# Patient Record
Sex: Female | Born: 1940 | Race: Black or African American | Hispanic: No | Smoking: Never smoker
Health system: Southern US, Community
[De-identification: ages and names within clinical notes are randomized; demographics above are authoritative.]

## PROBLEM LIST (undated history)

## (undated) DIAGNOSIS — G459 Transient cerebral ischemic attack, unspecified: Secondary | ICD-10-CM

## (undated) DIAGNOSIS — K219 Gastro-esophageal reflux disease without esophagitis: Secondary | ICD-10-CM

## (undated) DIAGNOSIS — I219 Acute myocardial infarction, unspecified: Secondary | ICD-10-CM

## (undated) DIAGNOSIS — E7800 Pure hypercholesterolemia, unspecified: Secondary | ICD-10-CM

## (undated) DIAGNOSIS — I499 Cardiac arrhythmia, unspecified: Secondary | ICD-10-CM

## (undated) DIAGNOSIS — R0600 Dyspnea, unspecified: Secondary | ICD-10-CM

## (undated) DIAGNOSIS — Z950 Presence of cardiac pacemaker: Secondary | ICD-10-CM

## (undated) DIAGNOSIS — G4730 Sleep apnea, unspecified: Secondary | ICD-10-CM

## (undated) DIAGNOSIS — R42 Dizziness and giddiness: Secondary | ICD-10-CM

## (undated) DIAGNOSIS — I10 Essential (primary) hypertension: Secondary | ICD-10-CM

## (undated) DIAGNOSIS — E785 Hyperlipidemia, unspecified: Secondary | ICD-10-CM

## (undated) DIAGNOSIS — F329 Major depressive disorder, single episode, unspecified: Secondary | ICD-10-CM

## (undated) DIAGNOSIS — I739 Peripheral vascular disease, unspecified: Secondary | ICD-10-CM

## (undated) DIAGNOSIS — I639 Cerebral infarction, unspecified: Secondary | ICD-10-CM

## (undated) DIAGNOSIS — J189 Pneumonia, unspecified organism: Secondary | ICD-10-CM

## (undated) DIAGNOSIS — R351 Nocturia: Secondary | ICD-10-CM

## (undated) DIAGNOSIS — F32A Depression, unspecified: Secondary | ICD-10-CM

## (undated) DIAGNOSIS — M1990 Unspecified osteoarthritis, unspecified site: Secondary | ICD-10-CM

## (undated) DIAGNOSIS — I2510 Atherosclerotic heart disease of native coronary artery without angina pectoris: Secondary | ICD-10-CM

## (undated) DIAGNOSIS — I509 Heart failure, unspecified: Secondary | ICD-10-CM

## (undated) HISTORY — PX: INSERT / REPLACE / REMOVE PACEMAKER: SUR710

## (undated) HISTORY — PX: VASCULAR SURGERY: SHX849

## (undated) HISTORY — PX: PACEMAKER INSERTION: SHX728

## (undated) HISTORY — PX: TUBAL LIGATION: SHX77

## (undated) HISTORY — PX: DILATION AND CURETTAGE OF UTERUS: SHX78

## (undated) HISTORY — PX: ABDOMINAL HYSTERECTOMY: SHX81

## (undated) HISTORY — PX: CARDIAC CATHETERIZATION: SHX172

## (undated) SURGERY — COLONOSCOPY
Anesthesia: Monitor Anesthesia Care

## (undated) NOTE — *Deleted (*Deleted)
   Subjective: *** Patient evaluated at bedside this AM. States she feels better today except new headache. Discussion about restarting her home medications regimen and patient agrees with the plan.  Denies any chest pain, burning during urination.  States has observed new sores on legs and previous ones are healing well.   Objective:  Vital signs in last 24 hours: Vitals:   09/23/20 0338 09/23/20 1305 09/23/20 2039 09/24/20 0515  BP: 111/76 133/79 113/70 138/67  Pulse: 75 (!) 57 79 (!) 59  Resp: 18 18 18 18   Temp: 98.5 F (36.9 C) 98.3 F (36.8 C) 98.2 F (36.8 C) 98 F (36.7 C)  TempSrc: Oral Oral Oral   SpO2: 92% 95% 96% 98%  Weight:      Height:       ***  Assessment/Plan:  Principal Problem:   Bacteremia due to Escherichia coli Active Problems:   Morbid obesity (HCC)   Paroxysmal atrial fibrillation (HCC)   Pressure injury of skin   Acute on chronic heart failure with preserved ejection fraction (HFpEF) (HCC)  *** Prior to Admission Living Arrangement: Anticipated Discharge Location: Barriers to Discharge: Dispo: Anticipated discharge in approximately *** day(s).   Honor Junes, MD 09/24/2020, 8:28 AM Pager: @MYPAGER @ After 5pm on weekdays and 1pm on weekends: On Call pager [phone]

---

## 1998-12-24 ENCOUNTER — Encounter: Payer: Self-pay | Admitting: Internal Medicine

## 1998-12-24 ENCOUNTER — Ambulatory Visit (HOSPITAL_COMMUNITY): Admission: RE | Admit: 1998-12-24 | Discharge: 1998-12-24 | Payer: Self-pay | Admitting: Internal Medicine

## 1999-09-22 ENCOUNTER — Ambulatory Visit (HOSPITAL_COMMUNITY): Admission: RE | Admit: 1999-09-22 | Discharge: 1999-09-22 | Payer: Self-pay | Admitting: Cardiovascular Disease

## 1999-09-22 ENCOUNTER — Encounter: Payer: Self-pay | Admitting: Cardiovascular Disease

## 1999-12-18 ENCOUNTER — Encounter: Admission: RE | Admit: 1999-12-18 | Discharge: 1999-12-18 | Payer: Self-pay | Admitting: Cardiovascular Disease

## 1999-12-18 ENCOUNTER — Encounter: Payer: Self-pay | Admitting: Cardiovascular Disease

## 2000-02-18 ENCOUNTER — Ambulatory Visit (HOSPITAL_COMMUNITY): Admission: RE | Admit: 2000-02-18 | Discharge: 2000-02-18 | Payer: Self-pay | Admitting: Cardiovascular Disease

## 2000-02-19 ENCOUNTER — Encounter: Payer: Self-pay | Admitting: Cardiovascular Disease

## 2001-01-12 ENCOUNTER — Encounter: Payer: Self-pay | Admitting: Cardiovascular Disease

## 2001-01-12 ENCOUNTER — Ambulatory Visit (HOSPITAL_COMMUNITY): Admission: RE | Admit: 2001-01-12 | Discharge: 2001-01-12 | Payer: Self-pay | Admitting: Cardiovascular Disease

## 2001-01-18 ENCOUNTER — Other Ambulatory Visit: Admission: RE | Admit: 2001-01-18 | Discharge: 2001-01-18 | Payer: Self-pay | Admitting: Obstetrics and Gynecology

## 2001-04-19 ENCOUNTER — Emergency Department (HOSPITAL_COMMUNITY): Admission: EM | Admit: 2001-04-19 | Discharge: 2001-04-19 | Payer: Self-pay | Admitting: Emergency Medicine

## 2001-04-20 ENCOUNTER — Emergency Department (HOSPITAL_COMMUNITY): Admission: EM | Admit: 2001-04-20 | Discharge: 2001-04-21 | Payer: Self-pay | Admitting: Emergency Medicine

## 2001-04-21 ENCOUNTER — Encounter: Payer: Self-pay | Admitting: Emergency Medicine

## 2001-09-06 ENCOUNTER — Encounter (INDEPENDENT_AMBULATORY_CARE_PROVIDER_SITE_OTHER): Payer: Self-pay | Admitting: *Deleted

## 2001-09-06 ENCOUNTER — Ambulatory Visit (HOSPITAL_COMMUNITY): Admission: RE | Admit: 2001-09-06 | Discharge: 2001-09-06 | Payer: Self-pay | Admitting: Gastroenterology

## 2001-10-25 DIAGNOSIS — Z950 Presence of cardiac pacemaker: Secondary | ICD-10-CM

## 2001-10-25 HISTORY — DX: Presence of cardiac pacemaker: Z95.0

## 2003-12-09 ENCOUNTER — Emergency Department (HOSPITAL_COMMUNITY): Admission: EM | Admit: 2003-12-09 | Discharge: 2003-12-10 | Payer: Self-pay | Admitting: Emergency Medicine

## 2003-12-10 ENCOUNTER — Inpatient Hospital Stay (HOSPITAL_COMMUNITY): Admission: AD | Admit: 2003-12-10 | Discharge: 2003-12-13 | Payer: Self-pay | Admitting: Cardiovascular Disease

## 2005-01-03 ENCOUNTER — Emergency Department (HOSPITAL_COMMUNITY): Admission: EM | Admit: 2005-01-03 | Discharge: 2005-01-03 | Payer: Self-pay | Admitting: Emergency Medicine

## 2005-09-21 ENCOUNTER — Ambulatory Visit (HOSPITAL_COMMUNITY): Admission: RE | Admit: 2005-09-21 | Discharge: 2005-09-21 | Payer: Self-pay | Admitting: Cardiovascular Disease

## 2006-03-31 ENCOUNTER — Ambulatory Visit (HOSPITAL_COMMUNITY): Admission: RE | Admit: 2006-03-31 | Discharge: 2006-03-31 | Payer: Self-pay | Admitting: Cardiology

## 2006-07-25 ENCOUNTER — Emergency Department (HOSPITAL_COMMUNITY): Admission: EM | Admit: 2006-07-25 | Discharge: 2006-07-25 | Payer: Self-pay | Admitting: Emergency Medicine

## 2007-01-23 ENCOUNTER — Encounter: Admission: RE | Admit: 2007-01-23 | Discharge: 2007-01-23 | Payer: Self-pay | Admitting: Emergency Medicine

## 2007-06-14 ENCOUNTER — Emergency Department (HOSPITAL_COMMUNITY): Admission: EM | Admit: 2007-06-14 | Discharge: 2007-06-14 | Payer: Self-pay | Admitting: Emergency Medicine

## 2007-06-28 ENCOUNTER — Encounter: Admission: RE | Admit: 2007-06-28 | Discharge: 2007-06-28 | Payer: Self-pay | Admitting: Cardiovascular Disease

## 2007-07-06 ENCOUNTER — Encounter: Admission: RE | Admit: 2007-07-06 | Discharge: 2007-07-06 | Payer: Self-pay | Admitting: Cardiovascular Disease

## 2008-06-09 IMAGING — CT CT CHEST W/O CM
2 of 4 series · 15 of 36 positions shown, 18 images · IV contrast (agent unspecified)
Comparison: Chest radiograph 07/25/06.  No more recent chest radiograph is available for review.  No prior chest CT.

CLINICAL DATA: Evaluate right upper lobe nodule found on recent chest x-ray.  Chronic shortness of breath with exertion and pacemaker.  
CHEST CT WITHOUT CONTRAST:
TECHNIQUE: Multidetector CT imaging of the chest was performed following the standard protocol without IV contrast.

[Series 2: chest w/o · axial · non-contrast · 0.70mm/px · z∈[-309,-59]mm · 12 of 60 slices shown, 15 images]
[im 5/60  mediastinal]
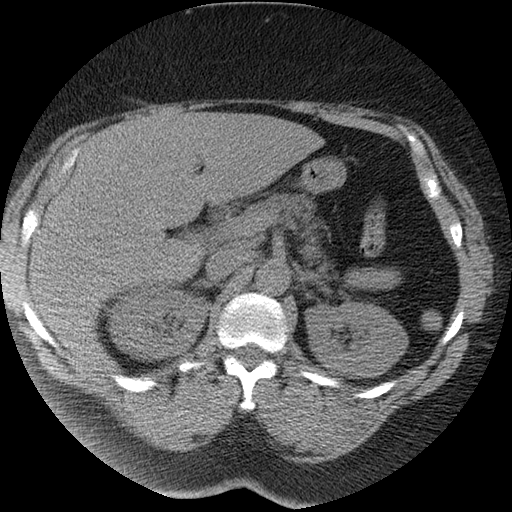
[im 5/60  lung]
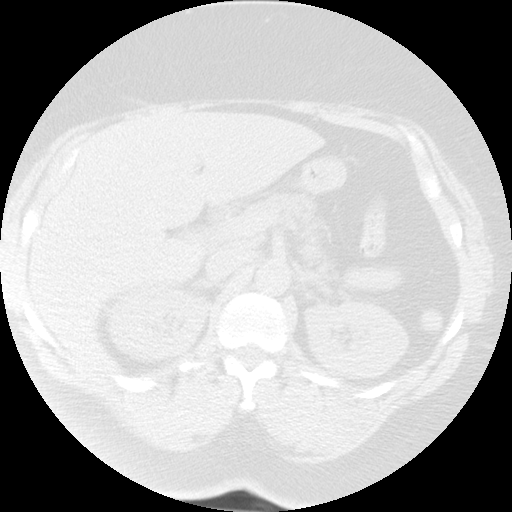
[im 10/60  lung]
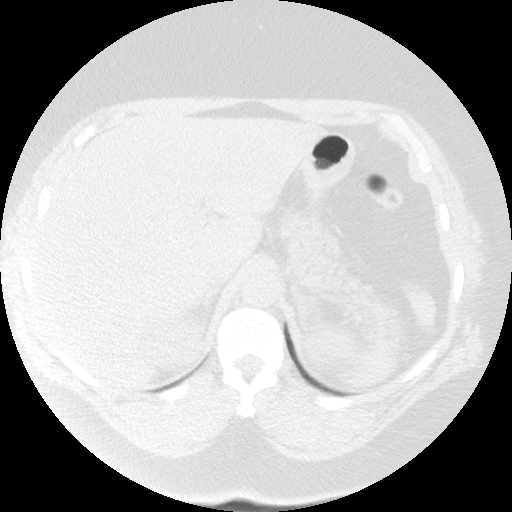
[im 14/60  lung]
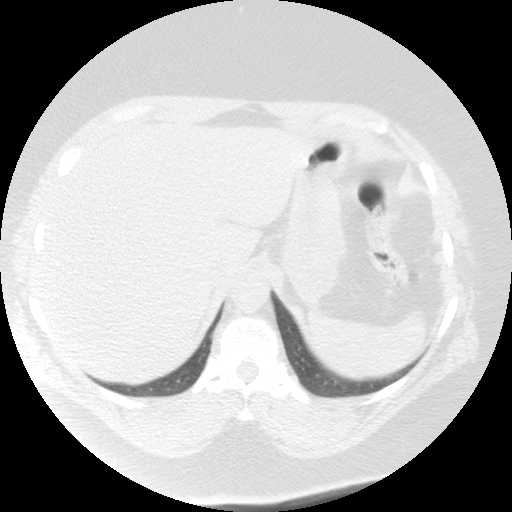
[im 19/60  lung]
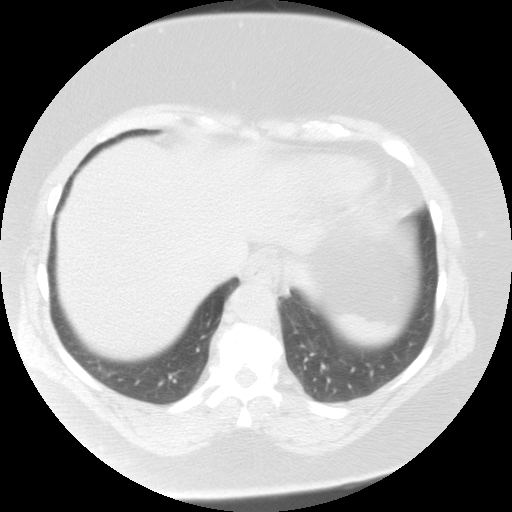
[im 23/60  mediastinal]
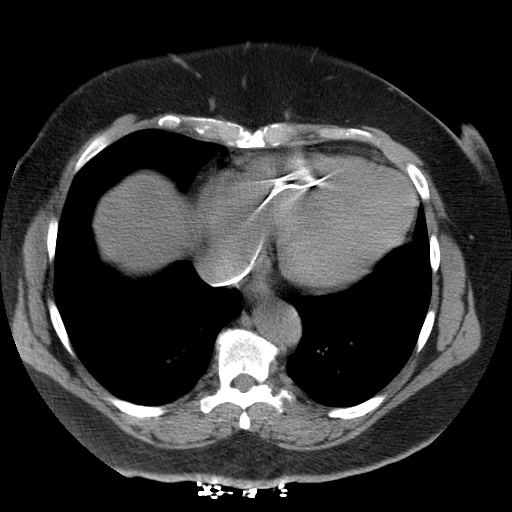
[im 23/60  lung]
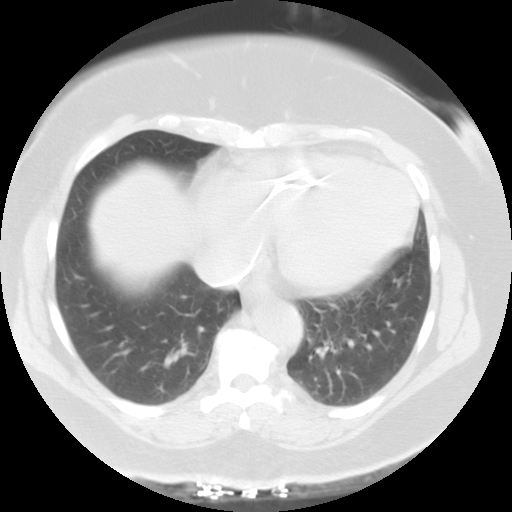
[im 28/60  lung]
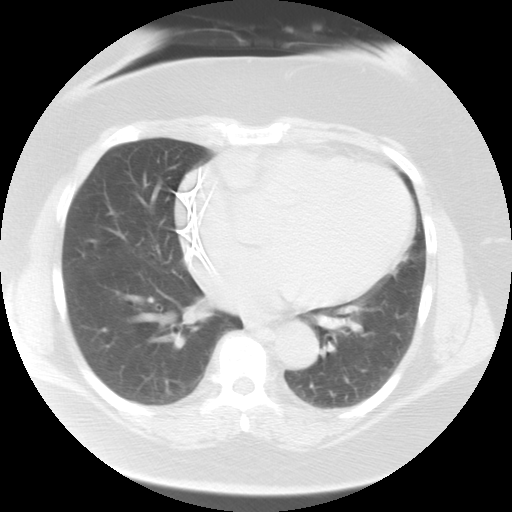
[im 32/60  lung]
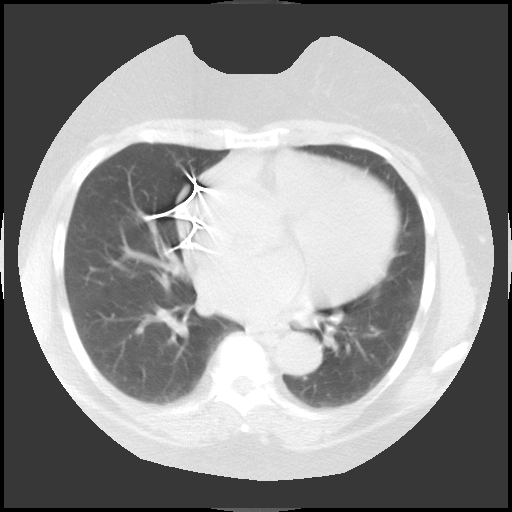
[im 37/60  lung]
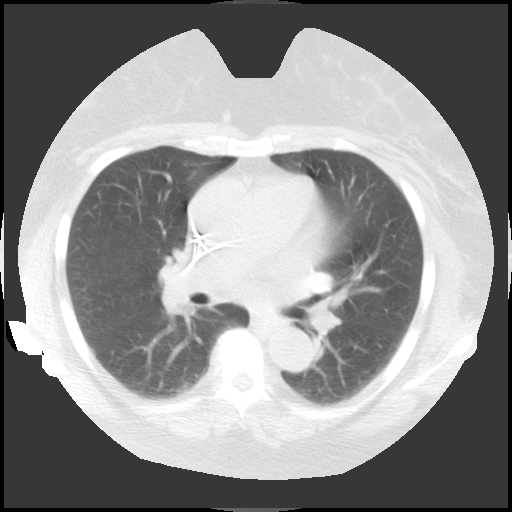
[im 41/60  mediastinal]
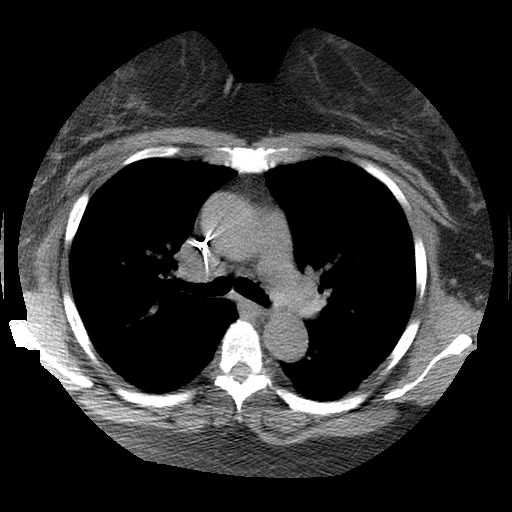
[im 41/60  lung]
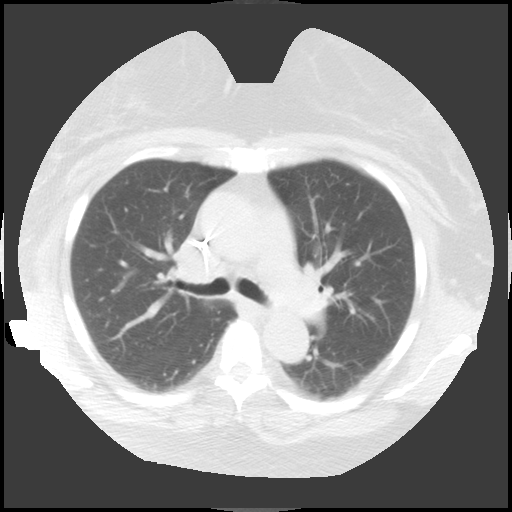
[im 46/60  lung]
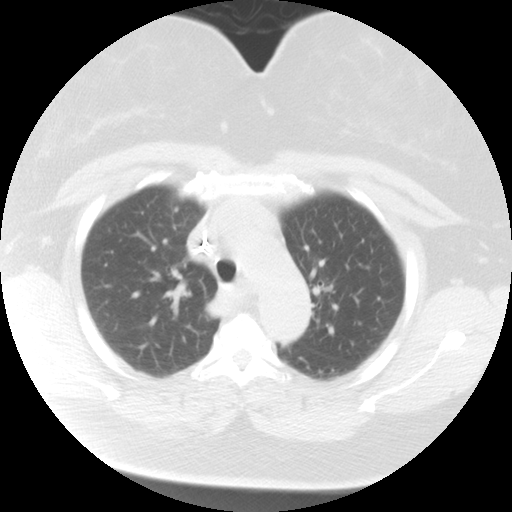
[im 50/60  lung]
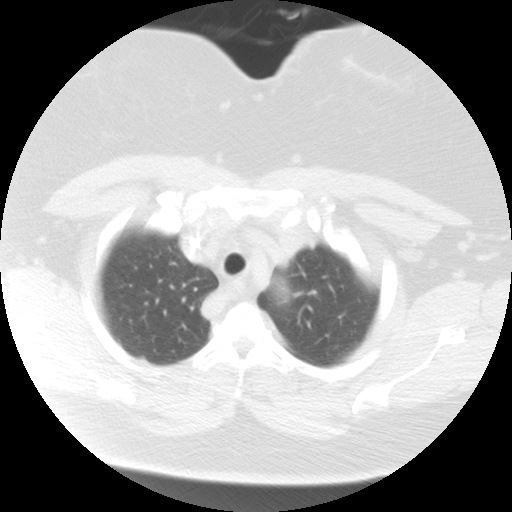
[im 55/60  lung]
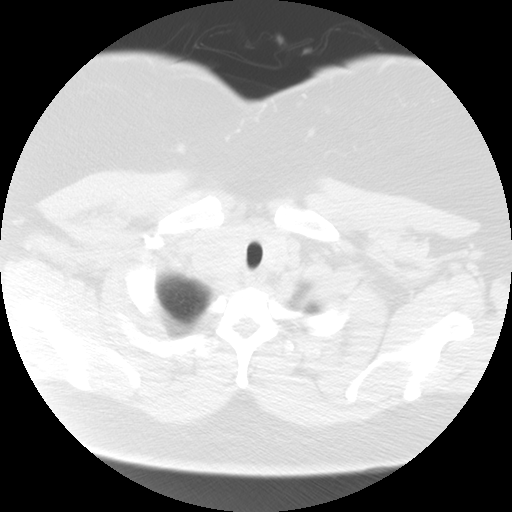

[Series 400: coronal chest · coronal · 0.70mm/px · 3 of 118 slices shown]
[im 24/118  lung]
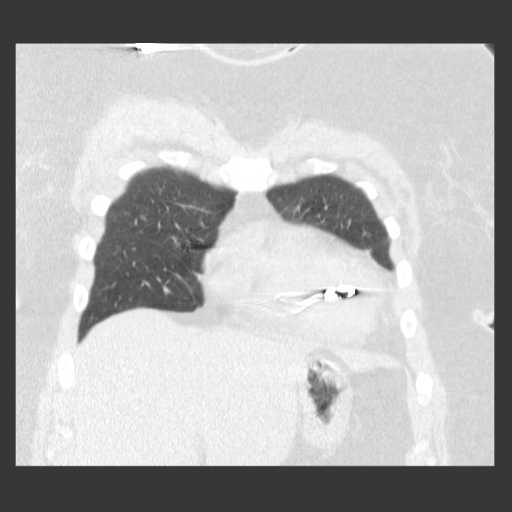
[im 47/118  lung]
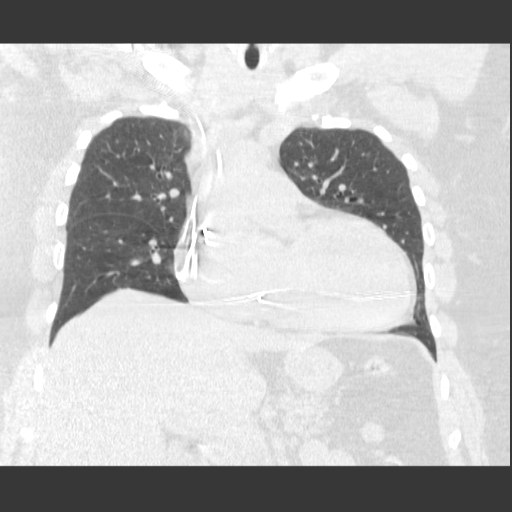
[im 71/118  lung]
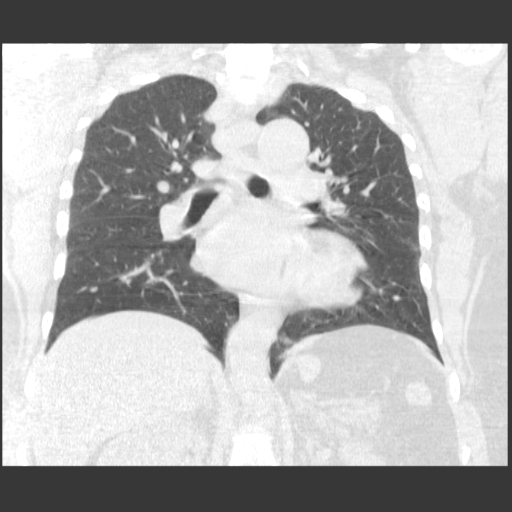

[15 of 36 positions shown; findings below may reference images not displayed]

FINDINGS: A right-sided pacemaker is identified, the leads of which cause some artifact.  On the scout view of the chest CT the leads of the pacer appear similar to the chest radiograph on [DATE].  The right lobe of the thyroid gland is enlarged, measuring up to 3.7 x 4.3 cm in axial dimension.  
There is cardiomegaly.  
Negative for pleural or pericardial effusion.  Minimal atherosclerotic calcification in the thoracic aortic arch.  No mediastinal or hilar lymphadenopathy identified.
In the right lower lobe (image #27 of series 3) is an 8 to 9 mm nodule, which is noncalcified.  In the right upper lobe (image #15 of series 3) is a 4.4 mm noncalcified pulmonary nodule.  No discrete nodules are identified on the left.  
Mild degenerative changes of the visualized thoracic spine but no evidence for compression deformities.  
The visualized portion of the upper abdomen shows a small hiatal hernia.  There is suggestion of a possible low density lesion such as a cyst in the inferior most image of the right kidney (image #60 of series 2).
IMPRESSION: 1.  Two pulmonary nodules as described above.  Recommend followup noncontrast chest CT in 9 to 12 months to ensure stability.  
2.  Question right renal low density lesion, partially imaged.  Consider followup renal ultrasound. 
3.  Enlargement of the right lobe of the thyroid gland.  
4.  Cardiomegaly.

## 2008-07-09 ENCOUNTER — Emergency Department (HOSPITAL_COMMUNITY): Admission: EM | Admit: 2008-07-09 | Discharge: 2008-07-09 | Payer: Self-pay | Admitting: Emergency Medicine

## 2009-05-09 ENCOUNTER — Emergency Department (HOSPITAL_BASED_OUTPATIENT_CLINIC_OR_DEPARTMENT_OTHER): Admission: EM | Admit: 2009-05-09 | Discharge: 2009-05-09 | Payer: Self-pay | Admitting: Emergency Medicine

## 2009-05-09 ENCOUNTER — Ambulatory Visit: Payer: Self-pay | Admitting: Diagnostic Radiology

## 2009-05-16 ENCOUNTER — Ambulatory Visit (HOSPITAL_COMMUNITY): Admission: RE | Admit: 2009-05-16 | Discharge: 2009-05-16 | Payer: Self-pay | Admitting: Cardiovascular Disease

## 2009-05-16 ENCOUNTER — Ambulatory Visit: Payer: Self-pay | Admitting: *Deleted

## 2009-05-16 ENCOUNTER — Encounter (INDEPENDENT_AMBULATORY_CARE_PROVIDER_SITE_OTHER): Payer: Self-pay | Admitting: Cardiovascular Disease

## 2010-02-27 ENCOUNTER — Ambulatory Visit: Payer: Self-pay | Admitting: Radiology

## 2010-02-27 ENCOUNTER — Encounter: Payer: Self-pay | Admitting: Emergency Medicine

## 2010-02-27 ENCOUNTER — Inpatient Hospital Stay (HOSPITAL_COMMUNITY): Admission: AD | Admit: 2010-02-27 | Discharge: 2010-03-01 | Payer: Self-pay | Admitting: Internal Medicine

## 2010-02-27 ENCOUNTER — Ambulatory Visit: Payer: Self-pay | Admitting: Vascular Surgery

## 2010-02-27 ENCOUNTER — Encounter (INDEPENDENT_AMBULATORY_CARE_PROVIDER_SITE_OTHER): Payer: Self-pay | Admitting: Internal Medicine

## 2010-02-27 ENCOUNTER — Ambulatory Visit: Payer: Self-pay | Admitting: Cardiology

## 2011-01-12 LAB — COMPREHENSIVE METABOLIC PANEL
ALT: 12 U/L (ref 0–35)
AST: 17 U/L (ref 0–37)
Albumin: 3.3 g/dL — ABNORMAL LOW (ref 3.5–5.2)
Alkaline Phosphatase: 73 U/L (ref 39–117)
BUN: 11 mg/dL (ref 6–23)
CO2: 23 mEq/L (ref 19–32)
Calcium: 8.5 mg/dL (ref 8.4–10.5)
Chloride: 109 mEq/L (ref 96–112)
Creatinine, Ser: 0.95 mg/dL (ref 0.4–1.2)
GFR calc Af Amer: >60 mL/min (ref >60)
GFR calc non Af Amer: 59 mL/min — ABNORMAL LOW (ref >60)
Glucose, Bld: 92 mg/dL (ref 70–99)
Potassium: 3.7 mEq/L (ref 3.5–5.1)
Sodium: 141 mEq/L (ref 135–145)
Total Bilirubin: 0.7 mg/dL (ref 0.3–1.2)
Total Protein: 6.7 g/dL (ref 6.0–8.3)

## 2011-01-12 LAB — CARDIAC PANEL(CRET KIN+CKTOT+MB+TROPI)
CK, MB: 0.7 ng/mL (ref 0.3–4.0)
CK, MB: 0.9 ng/mL (ref 0.3–4.0)
Relative Index: INVALID (ref 0.0–2.5)
Relative Index: INVALID (ref 0.0–2.5)
Relative Index: INVALID (ref 0.0–2.5)
Total CK: 64 U/L (ref 7–177)
Total CK: 73 U/L (ref 7–177)
Troponin I: 0.02 ng/mL (ref 0.00–0.06)
Troponin I: 0.03 ng/mL (ref 0.00–0.06)

## 2011-01-12 LAB — URINALYSIS, ROUTINE W REFLEX MICROSCOPIC
Ketones, ur: NEGATIVE mg/dL
Protein, ur: NEGATIVE mg/dL
Urobilinogen, UA: 0.2 mg/dL (ref 0.0–1.0)

## 2011-01-12 LAB — LIPID PANEL
Cholesterol: 158 mg/dL (ref 0–200)
HDL: 29 mg/dL — ABNORMAL LOW (ref >39)
LDL Cholesterol: 109 mg/dL — ABNORMAL HIGH (ref 0–99)
Total CHOL/HDL Ratio: 5.4 RATIO
Triglycerides: 100 mg/dL (ref <150)
VLDL: 20 mg/dL (ref 0–40)

## 2011-01-12 LAB — DIFFERENTIAL
Lymphocytes Relative: 30 % (ref 12–46)
Lymphs Abs: 1.6 10*3/uL (ref 0.7–4.0)
Monocytes Relative: 9 % (ref 3–12)
Neutro Abs: 2.8 10*3/uL (ref 1.7–7.7)
Neutrophils Relative %: 53 % (ref 43–77)

## 2011-01-12 LAB — HEMOGLOBIN A1C
Hgb A1c MFr Bld: 5.5 % (ref <5.7)
Mean Plasma Glucose: 111 mg/dL (ref <117)

## 2011-01-12 LAB — BASIC METABOLIC PANEL
Calcium: 9 mg/dL (ref 8.4–10.5)
Chloride: 108 mEq/L (ref 96–112)
Creatinine, Ser: 0.9 mg/dL (ref 0.4–1.2)
GFR calc Af Amer: >60 mL/min (ref >60)
GFR calc non Af Amer: >60 mL/min (ref >60)

## 2011-01-12 LAB — CBC
HCT: 39 % (ref 36.0–46.0)
Hemoglobin: 12.7 g/dL (ref 12.0–15.0)
MCHC: 32.5 g/dL (ref 30.0–36.0)
MCV: 94.5 fL (ref 78.0–100.0)
Platelets: 190 10*3/uL (ref 150–400)
RBC: 4.12 MIL/uL (ref 3.87–5.11)
RBC: 4.48 MIL/uL (ref 3.87–5.11)
RDW: 15 % (ref 11.5–15.5)
WBC: 4.7 10*3/uL (ref 4.0–10.5)
WBC: 5.2 10*3/uL (ref 4.0–10.5)

## 2011-01-12 LAB — PROTIME-INR
INR: 1.09 (ref 0.00–1.49)
Prothrombin Time: 14 seconds (ref 11.6–15.2)

## 2011-01-12 LAB — POCT CARDIAC MARKERS
CKMB, poc: <1 ng/mL — ABNORMAL LOW (ref 1.0–8.0)
Myoglobin, poc: 51.2 ng/mL (ref 12–200)

## 2011-01-12 LAB — GLUCOSE, CAPILLARY
Glucose-Capillary: 106 mg/dL — ABNORMAL HIGH (ref 70–99)
Glucose-Capillary: 107 mg/dL — ABNORMAL HIGH (ref 70–99)
Glucose-Capillary: 115 mg/dL — ABNORMAL HIGH (ref 70–99)
Glucose-Capillary: 133 mg/dL — ABNORMAL HIGH (ref 70–99)
Glucose-Capillary: 135 mg/dL — ABNORMAL HIGH (ref 70–99)
Glucose-Capillary: 94 mg/dL (ref 70–99)
Glucose-Capillary: 92 mg/dL (ref 70–99)

## 2011-01-12 LAB — TSH: TSH: 3.541 u[IU]/mL (ref 0.350–4.500)

## 2011-01-12 LAB — HOMOCYSTEINE: Homocysteine: 11.2 umol/L (ref 4.0–15.4)

## 2011-01-14 ENCOUNTER — Other Ambulatory Visit: Payer: Self-pay | Admitting: Internal Medicine

## 2011-01-14 ENCOUNTER — Ambulatory Visit
Admission: RE | Admit: 2011-01-14 | Discharge: 2011-01-14 | Disposition: A | Discharge status: Home / Self Care | Payer: Medicare Other | Source: Ambulatory Visit | Attending: Internal Medicine | Admitting: Internal Medicine

## 2011-01-14 DIAGNOSIS — R05 Cough: Secondary | ICD-10-CM

## 2011-01-31 LAB — BASIC METABOLIC PANEL
BUN: 14 mg/dL (ref 6–23)
CO2: 26 mEq/L (ref 19–32)
Calcium: 9 mg/dL (ref 8.4–10.5)
Creatinine, Ser: 1 mg/dL (ref 0.4–1.2)
GFR calc Af Amer: >60 mL/min (ref >60)

## 2011-01-31 LAB — CBC
HCT: 44.1 % (ref 36.0–46.0)
Hemoglobin: 14.5 g/dL (ref 12.0–15.0)
MCHC: 32.9 g/dL (ref 30.0–36.0)
MCV: 93.8 fL (ref 78.0–100.0)
Platelets: 202 10*3/uL (ref 150–400)
RBC: 4.71 MIL/uL (ref 3.87–5.11)
RDW: 14.4 % (ref 11.5–15.5)
WBC: 12.1 10*3/uL — ABNORMAL HIGH (ref 4.0–10.5)

## 2011-01-31 LAB — HEPATIC FUNCTION PANEL
ALT: 7 U/L (ref 0–35)
AST: 27 U/L (ref 0–37)
Albumin: 4.2 g/dL (ref 3.5–5.2)
Alkaline Phosphatase: 147 U/L — ABNORMAL HIGH (ref 39–117)
Bilirubin, Direct: 0 mg/dL (ref 0.0–0.3)
Indirect Bilirubin: 0.5 mg/dL (ref 0.3–0.9)
Total Bilirubin: 0.5 mg/dL (ref 0.3–1.2)
Total Protein: 8.6 g/dL — ABNORMAL HIGH (ref 6.0–8.3)

## 2011-01-31 LAB — DIFFERENTIAL
Basophils Absolute: 0.1 K/uL (ref 0.0–0.1)
Basophils Relative: 1 % (ref 0–1)
Eosinophils Absolute: 0 10*3/uL (ref 0.0–0.7)
Eosinophils Relative: 0 % (ref 0–5)
Lymphocytes Relative: 8 % — ABNORMAL LOW (ref 12–46)
Lymphs Abs: 1 K/uL (ref 0.7–4.0)
Monocytes Absolute: 0.5 K/uL (ref 0.1–1.0)
Monocytes Relative: 4 % (ref 3–12)
Neutro Abs: 10.5 10*3/uL — ABNORMAL HIGH (ref 1.7–7.7)
Neutrophils Relative %: 86 % — ABNORMAL HIGH (ref 43–77)

## 2011-01-31 LAB — BASIC METABOLIC PANEL WITH GFR
Chloride: 103 meq/L (ref 96–112)
GFR calc non Af Amer: 56 mL/min — ABNORMAL LOW (ref >60)
Glucose, Bld: 188 mg/dL — ABNORMAL HIGH (ref 70–99)
Potassium: 4 meq/L (ref 3.5–5.1)
Sodium: 141 meq/L (ref 135–145)

## 2011-01-31 LAB — LIPASE, BLOOD: Lipase: 30 U/L (ref 23–300)

## 2011-02-09 ENCOUNTER — Emergency Department (HOSPITAL_BASED_OUTPATIENT_CLINIC_OR_DEPARTMENT_OTHER)
Admission: EM | Admit: 2011-02-09 | Discharge: 2011-02-09 | Disposition: A | Discharge status: Home / Self Care | Payer: Medicare Other | Attending: Emergency Medicine | Admitting: Emergency Medicine

## 2011-02-09 DIAGNOSIS — J329 Chronic sinusitis, unspecified: Secondary | ICD-10-CM | POA: Insufficient documentation

## 2011-02-09 DIAGNOSIS — I2510 Atherosclerotic heart disease of native coronary artery without angina pectoris: Secondary | ICD-10-CM | POA: Insufficient documentation

## 2011-02-09 DIAGNOSIS — I10 Essential (primary) hypertension: Secondary | ICD-10-CM | POA: Insufficient documentation

## 2011-02-09 DIAGNOSIS — Z79899 Other long term (current) drug therapy: Secondary | ICD-10-CM | POA: Insufficient documentation

## 2011-02-09 DIAGNOSIS — Z8739 Personal history of other diseases of the musculoskeletal system and connective tissue: Secondary | ICD-10-CM | POA: Insufficient documentation

## 2011-02-22 ENCOUNTER — Ambulatory Visit
Admission: RE | Admit: 2011-02-22 | Discharge: 2011-02-22 | Disposition: A | Discharge status: Home / Self Care | Payer: Medicare Other | Source: Ambulatory Visit | Attending: Cardiovascular Disease | Admitting: Cardiovascular Disease

## 2011-02-22 ENCOUNTER — Other Ambulatory Visit: Payer: Self-pay | Admitting: Cardiovascular Disease

## 2011-02-22 DIAGNOSIS — R110 Nausea: Secondary | ICD-10-CM

## 2011-03-04 ENCOUNTER — Emergency Department (HOSPITAL_BASED_OUTPATIENT_CLINIC_OR_DEPARTMENT_OTHER)
Admission: EM | Admit: 2011-03-04 | Discharge: 2011-03-04 | Disposition: A | Discharge status: Home / Self Care | Payer: No Typology Code available for payment source | Attending: Emergency Medicine | Admitting: Emergency Medicine

## 2011-03-04 ENCOUNTER — Emergency Department (INDEPENDENT_AMBULATORY_CARE_PROVIDER_SITE_OTHER): Payer: No Typology Code available for payment source

## 2011-03-04 DIAGNOSIS — M25559 Pain in unspecified hip: Secondary | ICD-10-CM

## 2011-03-04 DIAGNOSIS — I10 Essential (primary) hypertension: Secondary | ICD-10-CM | POA: Insufficient documentation

## 2011-03-04 DIAGNOSIS — I2510 Atherosclerotic heart disease of native coronary artery without angina pectoris: Secondary | ICD-10-CM | POA: Insufficient documentation

## 2011-03-04 DIAGNOSIS — Y92410 Unspecified street and highway as the place of occurrence of the external cause: Secondary | ICD-10-CM | POA: Insufficient documentation

## 2011-03-04 DIAGNOSIS — E669 Obesity, unspecified: Secondary | ICD-10-CM

## 2011-03-04 DIAGNOSIS — S7000XA Contusion of unspecified hip, initial encounter: Secondary | ICD-10-CM | POA: Insufficient documentation

## 2011-03-04 DIAGNOSIS — S139XXA Sprain of joints and ligaments of unspecified parts of neck, initial encounter: Secondary | ICD-10-CM | POA: Insufficient documentation

## 2011-03-12 NOTE — Op Note (Signed)
NAME:  [name], [name]                   ACCOUNT NO.:  [id number]   MEDICAL RECORD NO.:  [id number]                   PATIENT TYPE:  INP   LOCATION:  2910                                 FACILITY:  [facility]   PHYSICIAN:  Minette Brine. Tysinger, M.D.              DATE OF BIRTH:  08/14/1941   DATE OF PROCEDURE:  12/12/2003  DATE OF DISCHARGE:  12/13/2003                                 OPERATIVE REPORT   PACEMAKER INSERTION   REFERRING PHYSICIAN:  Birdie Riddle, M.D.   PROCEDURE:  Insertion of AV sequential permanent transvenous pacemaker, DDD  mode.   INDICATION FOR PROCEDURE:  This 70 year old female presented with episodes  of dizziness and near syncope and telemetry in the hospital revealed  transient third degree AV block with heart rates less than 30 per minute  which were associated with dizzy spells.  She also had a documented six  second pause in which she had been ventricular stand still and normal P-  waves.  She was not on a beta blocker and her thyroid function has been  normal.  She had cardiac cath which showed essentially normal coronary  arteries with normal flow and with these findings she was considered a  candidate for AV sequential pacing.   PROCEDURE:  After signing an informed consent, the patient was premedicated  with 5 mg of Valium by mouth and brought to the electrophysiology lab at the  [facility] cardiac cath lab.  Her right anterior chest and base of neck were  prepped and draped in the sterile fashion and a right transverse  subclavicular plane was anesthetized locally with 1% lidocaine.  An incision  was made in this anesthetized plane with the incision being deepened into  the fascial layer overlying the pectoralis muscle.  A pocket was then formed  in this fascial layer for later insertion of the pulse generator.  An 8-  [place] Cook Introducer sheath was inserted percutaneously in the right  subclavian vein with a Seldinger wire being easily passed  into the superior  vena cava.  A bipolar ventricular lead was then inserted through the 8-  [place] sheath made by [facility], model [id number], serial [id number].  This  lead was advanced to the superior vena cava and the 8-French sheath was  removed in the usual fashion.  We then inserted a 7-French Cook Introducer  sheath percutaneously into the right subclavian vein and a [employer]  atrial J-lead was selected, model [id number], serial [id number].  After proper  preparation, this was inserted through the 7-French sheath and advanced to  the superior vena cava.  The 7-French sheath was removed in the usual  fashion.  The ventricular lead was then advanced into the right atrium and  right ventricle where the tip was positioned in the right ventricular apex.  After several different repositionings, we found a very good pacing area  with R-wave sensitivity of 28.9 mV, a resistance  of 704 ohms, and a minimum  voltage threshold of 0.4 volts, utilizing 0.6 ma of current.  We then  secured this lead at its insertion site with 2-0 silk.  The atrial J-lead  was then advanced into the right atrium where the tip was positioned  anteriorly in the right atrial appendage.  Very good pacing parameters were  obtained with a P-wave sensitivity of 9.0 mV, a resistance of 583 ohms, and  a minimum voltage threshold of 0.5 volts, utilizing 0.8 ma of current.  After obtaining these parameters, the atrial lead was secured, probably at  its insertion site.  The wound was lavaged profusely with a kanamycin  solution.  We then selected a [employer] pulse generator, model  Identity XL-DR, model [id number], serial [id number].  After proper analyzing the  pulse generator, it was attached to the pacing electrodes in the usual  fashion.  The pulse generator was then placed within the previously formed  pocket and the wound was closed in layers using 2-0 Dexon.  Final skin  closure was obtained with a cutaneous layer  of  Steri-Strips.  The patient tolerated the procedure well and no complications  were noted.  At the end of the procedure, a sterile bulky dressing was  applied to the wound and she was returned to her room in satisfactory  condition.  The pacemaker is note to be functioning normally in the DDD  mode.                                               John R. Glade Lloyd, M.D.    JRT/MEDQ  D:  12/12/2003  T:  12/13/2003  Job:  [id number]   cc:   Birdie Riddle, M.D.  [address] [address]   [facility]

## 2011-03-12 NOTE — Procedures (Signed)
[place]. [facility]  Patient:    [name], [name] Visit Number: [id number] MRN: [id number]          Service Type: END Location: ENDO Attending Physician:  Orvis Brill Dictated by:   Jeryl Columbia, M.D. Proc. Date: 09/06/01 Admit Date:  09/06/2001   CC:         Birdie Riddle, M.D.                           Procedure Report  PROCEDURE PERFORMED:  Colonoscopy  INDICATION:  Guaiac positivity.  Two episodes of bright red blood.  Consent was signed after risks, benefits, method, and options were thoroughly discussed in the office.  MEDICINES USED ADDITIONAL FOR THIS PROCEDURE:  Versed 3.  DESCRIPTION OF PROCEDURE:  Rectal inspection is pertinent for small external hemorrhoids.  Digital exam was negative.  Video colonoscope was inserted and with surprising ease advanced around the colon to the hepatic flexure.  At that point, the scope began to loop.  We rolled her on her back and with some abdominal pressure were able to advanced to the cecal pole which was identified by the appendiceal orifice and the ileocecal valve.  Prep was adequate.  No obvious abnormality was seen on insertion.  On slow withdraw through the colon other than an occasional left sided diverticula. No masses, polyps or signs of bleeding were seen as we slowly withdrew back to the rectum. Once back in the rectum, the scope was retroflexed. Pertinent for some internal hemorrhoids.  The scope was straightened and advanced a short ways up the left side of the colon.  Air was suctioned and the scope removed.  The patient tolerated the procedure well. There were no obvious immediate complication.  ENDOSCOPIC DIAGNOSES: 1. Internal and external hemorrhoids, small. 2. Left sided occasional diverticula. 3. Otherwise within normal limits to the cecum.  PLAN:  Follow up p.r.n. or in two to three months to recheck symptoms.  Guaiac to make sure no further workup plans are  needed. Otherwise yearly rectals and guaiac per Dr. Birdie Riddle and consider repeat screening in five to ten years. Dictated by:   Jeryl Columbia, M.D. Attending Physician:  Orvis Brill DD:  09/06/01 TD:  09/06/01 Job: [id number]

## 2011-03-12 NOTE — H&P (Signed)
[place]. [facility]  Patient:    [name], [name] Visit Number: [id number] MRN: [id number]          Service Type: END Location: ENDO Attending Physician:  Orvis Brill Dictated by:   Jeryl Columbia, M.D. Proc. Date: 09/06/01 Admit Date:  09/06/2001   CC:         Birdie Riddle, M.D.                         History and Physical  PROCEDURE:  Esophagogastroduodenoscopy with biopsy.  ENDOSCOPIST:  Jeryl Columbia, M.D.  INDICATIONS:  Guaiac positivity, upper tract symptoms.  INFORMED CONSENT:  Consent was signed after risk, benefits, methods and options were thoroughly discussed in the office.  MEDICINES USED:  Fentanyl 50 mcg, Versed 3 mg.  DESCRIPTION OF PROCEDURE:  The video endoscope was inserted by direct vision. The proximal and mid esophagus were normal.  In the distal esophagus was a small hiatal hernia and a small nodule which is probably just a little erythematous tissue at the GE junction.  A few cold biopsies were obtained at the end of the procedure.  There was no sign of Barretts or significant inflammation.  The scope passed into the stomach and advanced to the antrum where some minimal antritis was seen and advanced to a normal pylorus into a normal duodenal bulb and around the C-loop to a normal portion of the second duodenum.  The scope was withdrawn back to the bulb, and a good look there ruled out abnormalities in all locations.  The scope was withdrawn back into the stomach and retroflexed.  In the cardia, the hiatal hernia was confirmed. The fundus was normal.  Angularis, lesser and greater curve was normal except for some mild gastritis.  The scope was straightened and straight visualization of the stomach confirmed the above.  No additional findings were seen.  A few biopsies of the antrum and a few of the proximal stomach were obtained to confirm the gastritis as well as rule out Helicobacter.  The scope was removed  after air was suctioned.  Again a good look at the esophagus was normal except for the Z-line small erythematous nodule.  His biopsies were obtained at this point.  The scope was removed.  The patient tolerated the procedure well, and there were no obvious immediate complications.  ENDOSCOPIC DIAGNOSES: 1. Small hiatal hernia. 2. Z-line small nodule status post biopsy. 3. Minimal antritis and gastritis status post biopsy. 4. Otherwise normal esophagogastroduodenoscopy.  PLAN: 1. Await pathology. 2. Continue Prevacid. 3. Hiatal hernia reading material. 4. Antireflux measures. 5. See colonoscopy for further work-up and plans. Dictated by:   Jeryl Columbia, M.D. Attending Physician:  Orvis Brill DD:  09/06/01 TD:  09/06/01 Job: [id number]

## 2011-03-12 NOTE — Cardiovascular Report (Signed)
NAME:  [name], [name]                   ACCOUNT NO.:  [id number]   MEDICAL RECORD NO.:  [id number]                   PATIENT TYPE:  INP   LOCATION:  6599                                 FACILITY:  [facility]   PHYSICIAN:  Birdie Riddle, M.D.               DATE OF BIRTH:  1941/09/18   DATE OF PROCEDURE:  12/11/2003  DATE OF DISCHARGE:                              CARDIAC CATHETERIZATION   PROCEDURES:  1. Left heart catheterization.  2. Selective coronary angiography.  3. Left ventricular function study.  4. Transvenous temporary pacemaker placement.   INDICATIONS:  This 70 year old black female with history of hypertension,  obesity, and exertional dyspnea had transient third degree heart block with  a heart rate less than 30 per minute and history of previous dizzy spells at  night without beta blocker use.   APPROACH:  Right femoral artery using 5 French sheath and catheter in right  femoral vein with 6 French sheath and 5 French balloon-tip temporary  pacemaker.   COMPLICATIONS:  None.   HEMODYNAMIC DATA:  The left ventricular pressure was 202/7 and aortic  pressure was 202/121.   The patient required on squirt of sublingual nitroglycerin 200 mcg and 2.5  mg of enalaprilat IV for blood pressure control.   TRANSVENOUS TEMPORARY PACEMAKER PLACEMENT:  A 5 French balloon-tip pacer  wire was advanced in the right ventricle under fluoroscopic guidance.  It  was connected to the pacemaker box and after checking threshold and  sensitivity, the pacemaker was left at rate of 60 and MA of 5 and  sensitivity of 5.   CORONARY ANATOMY:  The left main coronary artery was short and unremarkable.   Left anterior descending coronary artery:  The left anterior descending  coronary artery had ostial 10% narrowing.  Otherwise, unremarkable.  Its  diagonal one and two vessels were unremarkable.  Diagonal three and four  were also unremarkable, but smaller vessels.   Left circumflex  coronary artery:  The left circumflex coronary artery and  essentially unremarkable with obtuse marginal branch being very small.  Obtuse marginal branch 2, 3, and 4 were unremarkable.   Right coronary artery:  The right coronary artery was dominant, large vessel  and was unremarkable with normal posterior lateral branch and posterior  descending coronary arteries.   LEFT VENTRICULOGRAM:  The left ventriculogram showed good left ventricular  systolic function with ejection fraction of 60%.   IMPRESSION:  1. Near normal coronaries.  2. Good left ventricular systolic function.  3. Hypertension.  4. Transient temporary pacemaker placement for high grade AV block.   RECOMMENDATIONS:  This patient will undergo permanent pacemaker placement by  Dr. Roe Rutherford in the near future and she will be managed medically  otherwise.  Birdie Riddle, M.D.    ASK/MEDQ  D:  12/11/2003  T:  12/11/2003  Job:  [id number]

## 2011-03-12 NOTE — Discharge Summary (Signed)
[name], [name]                   ACCOUNT NO.:  [id number]   MEDICAL RECORD NO.:  [id number]                   PATIENT TYPE:  INP   LOCATION:  2910                                 FACILITY:  [facility]   PHYSICIAN:  Birdie Riddle, M.D.               DATE OF BIRTH:  03-21-1941   DATE OF ADMISSION:  12/10/2003  DATE OF DISCHARGE:  12/13/2003                                 DISCHARGE SUMMARY   PRINCIPAL DIAGNOSES:  1. Paroxysmal high-grade atrioventricular block.  2. Hypertension.  3. Obesity.  4. Acute gastrointestinal bleed.  5. External hemorrhoids.  6. Anxiety.   PRINCIPAL PROCEDURES:  1. Left heart catheterization, left ventricular angiography, left _________     study done by Dr. Dixie Dials on December 11, 2003.  2. Identity XLDR DDDR pulse generator placement (atrioventricular sequential     pacer) by Dr. Roe Rutherford on December 12, 2003.   DISCHARGE MEDICATIONS:  1. Benicar 40 mg one daily.  2. Lasix 40 mg one daily.  3. Sular 20 mg one daily.  4. K-Dur 10 mEq two daily.  5. Protonix 40 mg one daily.  6. Dulcolax or glycerin suppository one daily for one week, then as needed.  7. Tylenol 325 mg four times daily as needed.   DISCHARGE ACTIVITY:  Sedentary for 10-14 days.  May walk with the arm in a  sling.   DISCHARGE DIET:  Low-fat, low-salt diet.   CONDITION ON DISCHARGE:  Stable.   FOLLOWUP:  By Dr. Roe Rutherford in 10 days, by Dr. Dixie Dials in four  weeks, and by Dr. Clarene Essex in two to four weeks.  Patient to call and make  appointments for outpatient colonoscopy and possible esophagoduodenoscopy.   HISTORY:  This 70 year old black female presented with recurrent GI bleed of  two days' duration.  She was evaluated in the emergency room.  She had had a  similar episode two years ago with EGD and colonoscopy done at that time  showing nonspecific findings.  She has cardiac risk factors of hypertension  and has morbid obesity.   PHYSICAL  EXAMINATION:  VITAL SIGNS:  Temperature 98.8, pulse 84, respiration  20, blood pressure 151/71, height 5 feet 8 inches, weight 298.5 pounds.  GENERAL:  Patient was alert, oriented x 3.  HEAD:  Normocephalic, atraumatic.  EYES:  [place] with pupils equally reactive to light, conjunctiva pink, sclera  white.  EARS, NOSE, THROAT:  Mucous membranes pink and moist.  NECK:  No JVD.  LUNGS:  Clear bilaterally.  HEART:  Normal S1, S2.  ABDOMEN:  Soft, nondistended.  EXTREMITIES:  No cyanosis or clubbing.  Trace edema.  RECTAL:  Sphincter tone normal.  Dark stool, positive for occult blood.  Presence of external hemorrhoids.  CNS:  Patient moving all four extremities.   LABORATORY DATA:  Normal hemoglobin/hematocrit, wbc count and platelet  count.  Normal sodium.  Low potassium of 3.0.  Normal BUN, creatinine.  Borderline glucose of 124-121.  Normal PT/PTT.  Occult blood positive x 2.  Liver enzymes normal.  Serum albumin normal.   Cardiac catheterization showed near-normal coronaries and good left  ventricular systolic function.  EKG showed sinus rhythm with possible left  atrial enlargement and left ventricular hypertrophy with QRS widening and  nonspecific T wave abnormality.   HOSPITAL COURSE:  The patient was admitted to telemetry unit.  GI consult of  Dr. Wynetta Emery was obtained.  The patient had repeat hemoglobin and hematocrit  checked that did not show any significant change.  However, on monitoring,  she showed a 5 second AV block.  Upon further questioning, the patient noted  occasional symptoms of dizziness in the night, although this did not occur  with the current event, as she was sleeping.  She underwent cardiac  catheterization that showed near-normal coronaries, with good left  ventricular systolic function, and she underwent permanent pacemaker  placement by Dr. Roe Rutherford on December 12, 2003.  She had no further  complications during her hospitalization, and per  gastroenterologist,  patient's colonoscopy and +/- EGD would be performed on an outpatient basis.  She was discharged home in satisfactory condition with followup by me in two  to four weeks, by Dr. Roe Rutherford in 10 days, and by Dr. Clarene Essex or one  of his associates in three to four weeks.  She will continue her  antihypertensive medications and Protonix, and also use Dulcolax or glycerin  suppository, as most likely source of fresh bleeding is from her external  hemorrhoids and hard stools.                                                Birdie Riddle, M.D.    ASK/MEDQ  D:  12/13/2003  T:  12/14/2003  Job:  [id number]

## 2011-03-12 NOTE — Procedures (Signed)
[place]. [facility]  Patient:    KITZIA, HONEGGER Visit Number: [id number] MRN: [id number]          Service Type: END Location: ENDO Attending Physician:  Orvis Brill Dictated by:   Jeryl Columbia, M.D. Proc. Date: 09/06/01 Admit Date:  09/06/2001   CC:         Birdie Riddle, M.D.   Procedure Report  PROCEDURE PERFORMED:  Esophagogastroduodenoscopy with biopsies.  ENDOSCOPIST:  Jeryl Columbia, M.D.  INDICATIONS FOR PROCEDURE:  Guaiac positivity, upper tract symptoms.  Consent was signed after risks, benefits, methods, and options were thoroughly discussed in the office.  MEDICATIONS USED:  Fentanyl 50 mcg, Versed 3 mg.  DESCRIPTION OF PROCEDURE:  Video endoscope inserted by direct vision.  The proximal and midesophagus were normal.  In the distal esophagus was a small hiatal hernia and a small nodule which was probably just a little erythematous tissue at the gastroesophageal junction.  A few cold biopsies were obtained at the end of the procedure.  There was no sign of Barretts or significant inflammation.  Scope passed into the stomach, advanced to the antrum where some minimal antritis was seen, advanced through a normal pylorus into a normal duodenal bulb and around the C-loop to a normal second portion of the duodenum.  The scope was withdrawn back to the bulb and a good look there ruled out abnormalities in that location.  Scope was withdrawn back to the stomach and retroflexed.  High in the cardia, the hiatal hernia was confirmed. The fundus was normal.  The angularis, lesser and greater curve was normal except for some mild gastritis.  The scope was straightened.  Straight visualization of the stomach confirmed the above.  No additional findings were seen.  A few biopsies in the antrum, a few in the proximal stomach were obtained to confirm gastritis, rule out helicobacter.  The scope was removed. After air was suctioned,  again a good look at the esophagus was normal except for the z-line small erythematous nodule.  These biopsies were obtained at this point.  The scope was removed.  The patient tolerated the procedure well. There was no obvious immediate complication.  ENDOSCOPIC DIAGNOSIS: 1. Small hiatal hernia. 2. Z-line small nodule status post biopsy. 3. Minimal antritis and gastritis, status post biopsy. 4. Otherwise normal esophagogastroduodenoscopy.  PLAN:  Await pathology.  Continue Prevacid, hiatal hernia reading material. Antireflux measures.  See colon dictation for further work-up and plans. Dictated by:   Jeryl Columbia, M.D. Attending Physician:  Orvis Brill DD:  09/06/01 TD:  09/06/01 Job: [id number]

## 2011-03-12 NOTE — Cardiovascular Report (Signed)
[name], [name]         ACCOUNT NO.:  [id number]   MEDICAL RECORD NO.:  [id number]          PATIENT TYPE:  OIB   LOCATION:  2899                         FACILITY:  [facility]   PHYSICIAN:  Iran Sizer, MD    DATE OF BIRTH:  1941/01/28   DATE OF PROCEDURE:  03/31/2006  DATE OF DISCHARGE:                              CARDIAC CATHETERIZATION   PROCEDURE:  1.  Left heart catheterization  2.  Coronary cineangiography.  3.  Left ventricular cineangiography.  4.  Angio-Seal of the right femoral artery.   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  She recently had  the onset of anterior chest discomfort and a stress test done on Mar 17, 2006, showed an abnormal study with an anterior and anterolateral scar with  superimposed ischemia as well as mild lateral inferior ischemia. With this  abnormal radial nuclear stress test, she was then scheduled for assessment  of her coronary status.   PROCEDURE:  After signing an informed consent, the patient was premedicated  with 5 mg of Valium by mouth and brought to the cardiac catheterization lab  at [facility].  Her right groin was prepped and draped in a sterile  fashion and anesthetized locally with 1% lidocaine.  A 6-French introducer  sheath was inserted percutaneously into the right femoral artery using an  ultrasound Smart needle for guidance.  We then used 6-French 4 Judkins  coronary catheters to make injections into the native coronary arteries.  A  6-French pigtail catheter was used to measure pressures in the left  ventricle and aorta and to make a mid stream injection into the left  ventricle.  A pullback across the aortic valve was recorded.  The patient  tolerated the procedure well and no complications were noted. At the end of  the procedure, the catheter and sheath were removed from the right femoral  artery and hemostasis was  easily obtained with an Angio-Seal closure system.   MEDICATIONS GIVEN:  None.   HEMODYNAMIC DATA:  There was no gradient across the aortic valve.   CORONARY CINEANGIOGRAPHY:  1.  Left coronary artery:  The ostium and left main appeared normal.   1.  Left anterior descending:  The LAD is normal in appearance but does have      slow antegrade flow and slow distal runoff, otherwise no significant      plaque was seen.   1.  Circumflex coronary artery:  The circumflex also is normal in appearance      and also has fairly slow antegrade flow and slow distal runoff.      Otherwise, there is no significant plaque and no significant abnormal      narrowing.   1.  Right coronary artery:  The ostium appears normal. The right coronary      artery is normal in appearance without significant plaque and no      significant narrowing.  The flow is mild to moderately decreased and      there is slow distal runoff.   1.  Left ventricular cineangiogram:  The left ventricular chamber size is      mild to moderately enlarged.  The overall left ventricular contractility      is normal with an ejection fraction estimated at 55%. There were no      abnormally moving segments.   FINAL DIAGNOSIS:  1.  Normal coronary arteries without evidence for atherosclerotic plaque.  2.  Mild to moderate slow flow in all coronary distributions with slow      distal runoff.  3.  Mild to moderate left ventricular enlargement.  Global wall without      segmental abnormality.  4.  Normal left ventricular ejection fraction of 55%.  5.  Successful Angio-Seal of the right femoral artery.   DISPOSITION:  In view of the slow arterial flow and normal appearing visible  coronary arteries, she may have small vessel disease. Also, with the slow  flow, it is imperative that she be placed on aspirin.  We will monitor in  the short stay unit until stable and she will then be able to be discharged  home today.      Iran Sizer, MD  Electronically Signed     JRT/MEDQ  D:  03/31/2006  T:  03/31/2006  Job:  [id number]

## 2011-03-13 ENCOUNTER — Emergency Department (HOSPITAL_BASED_OUTPATIENT_CLINIC_OR_DEPARTMENT_OTHER)
Admission: EM | Admit: 2011-03-13 | Discharge: 2011-03-13 | Disposition: A | Discharge status: Home / Self Care | Payer: Medicare Other | Attending: Emergency Medicine | Admitting: Emergency Medicine

## 2011-03-13 DIAGNOSIS — I10 Essential (primary) hypertension: Secondary | ICD-10-CM | POA: Insufficient documentation

## 2011-03-13 DIAGNOSIS — I2510 Atherosclerotic heart disease of native coronary artery without angina pectoris: Secondary | ICD-10-CM | POA: Insufficient documentation

## 2011-03-13 DIAGNOSIS — Z79899 Other long term (current) drug therapy: Secondary | ICD-10-CM | POA: Insufficient documentation

## 2011-03-13 DIAGNOSIS — M546 Pain in thoracic spine: Secondary | ICD-10-CM | POA: Insufficient documentation

## 2011-03-15 ENCOUNTER — Emergency Department (HOSPITAL_BASED_OUTPATIENT_CLINIC_OR_DEPARTMENT_OTHER)
Admission: EM | Admit: 2011-03-15 | Discharge: 2011-03-15 | Disposition: A | Discharge status: Other Acute Inpatient Hospital | Payer: Medicare Other | Source: Home / Self Care | Attending: Emergency Medicine | Admitting: Emergency Medicine

## 2011-03-15 ENCOUNTER — Inpatient Hospital Stay (HOSPITAL_COMMUNITY)
Admission: AD | Admit: 2011-03-15 | Discharge: 2011-03-18 | DRG: 287 | Disposition: A | Discharge status: Home Health | Payer: Medicare Other | Source: Other Acute Inpatient Hospital | Attending: Internal Medicine | Admitting: Internal Medicine

## 2011-03-15 ENCOUNTER — Emergency Department (INDEPENDENT_AMBULATORY_CARE_PROVIDER_SITE_OTHER): Payer: Medicare Other

## 2011-03-15 DIAGNOSIS — R5381 Other malaise: Secondary | ICD-10-CM | POA: Diagnosis present

## 2011-03-15 DIAGNOSIS — R0789 Other chest pain: Secondary | ICD-10-CM | POA: Diagnosis present

## 2011-03-15 DIAGNOSIS — I10 Essential (primary) hypertension: Secondary | ICD-10-CM | POA: Insufficient documentation

## 2011-03-15 DIAGNOSIS — K3189 Other diseases of stomach and duodenum: Secondary | ICD-10-CM | POA: Diagnosis present

## 2011-03-15 DIAGNOSIS — R42 Dizziness and giddiness: Secondary | ICD-10-CM

## 2011-03-15 DIAGNOSIS — Z950 Presence of cardiac pacemaker: Secondary | ICD-10-CM

## 2011-03-15 DIAGNOSIS — R079 Chest pain, unspecified: Secondary | ICD-10-CM

## 2011-03-15 DIAGNOSIS — R0602 Shortness of breath: Secondary | ICD-10-CM

## 2011-03-15 DIAGNOSIS — I5022 Chronic systolic (congestive) heart failure: Secondary | ICD-10-CM | POA: Diagnosis present

## 2011-03-15 DIAGNOSIS — I509 Heart failure, unspecified: Secondary | ICD-10-CM | POA: Diagnosis present

## 2011-03-15 DIAGNOSIS — R55 Syncope and collapse: Principal | ICD-10-CM | POA: Diagnosis present

## 2011-03-15 DIAGNOSIS — I6990 Unspecified sequelae of unspecified cerebrovascular disease: Secondary | ICD-10-CM

## 2011-03-15 DIAGNOSIS — E785 Hyperlipidemia, unspecified: Secondary | ICD-10-CM | POA: Insufficient documentation

## 2011-03-15 LAB — CARDIAC PANEL(CRET KIN+CKTOT+MB+TROPI)
CK, MB: 1.4 ng/mL (ref 0.3–4.0)
CK, MB: 1.5 ng/mL (ref 0.3–4.0)
Total CK: 86 U/L (ref 7–177)
Total CK: 95 U/L (ref 7–177)
Troponin I: <0.3 ng/mL (ref <0.30)

## 2011-03-15 LAB — URINALYSIS, ROUTINE W REFLEX MICROSCOPIC
Glucose, UA: NEGATIVE mg/dL
Hgb urine dipstick: NEGATIVE
Ketones, ur: NEGATIVE mg/dL
Protein, ur: NEGATIVE mg/dL
pH: 7 (ref 5.0–8.0)

## 2011-03-15 LAB — CBC
MCH: 30.2 pg (ref 26.0–34.0)
MCHC: 32.6 g/dL (ref 30.0–36.0)
MCV: 92.8 fL (ref 78.0–100.0)
Platelets: 178 10*3/uL (ref 150–400)
RBC: 4.43 MIL/uL (ref 3.87–5.11)
RDW: 14.8 % (ref 11.5–15.5)

## 2011-03-15 LAB — CK TOTAL AND CKMB (NOT AT ARMC): Relative Index: INVALID (ref 0.0–2.5)

## 2011-03-15 LAB — BASIC METABOLIC PANEL
BUN: 17 mg/dL (ref 6–23)
CO2: 26 mEq/L (ref 19–32)
Chloride: 103 mEq/L (ref 96–112)
Creatinine, Ser: 0.9 mg/dL (ref 0.4–1.2)
Glucose, Bld: 126 mg/dL — ABNORMAL HIGH (ref 70–99)
Potassium: 3.6 mEq/L (ref 3.5–5.1)

## 2011-03-15 LAB — DIFFERENTIAL
Basophils Relative: 1 % (ref 0–1)
Eosinophils Absolute: 0.2 10*3/uL (ref 0.0–0.7)
Eosinophils Relative: 4 % (ref 0–5)
Lymphs Abs: 1.1 10*3/uL (ref 0.7–4.0)
Monocytes Absolute: 0.4 10*3/uL (ref 0.1–1.0)
Monocytes Relative: 10 % (ref 3–12)
Neutrophils Relative %: 61 % (ref 43–77)

## 2011-03-16 ENCOUNTER — Inpatient Hospital Stay (HOSPITAL_COMMUNITY): Payer: Medicare Other

## 2011-03-16 LAB — CBC
HCT: 41.7 % (ref 36.0–46.0)
Hemoglobin: 13.6 g/dL (ref 12.0–15.0)
MCH: 30.7 pg (ref 26.0–34.0)
MCHC: 32.6 g/dL (ref 30.0–36.0)
RDW: 14.7 % (ref 11.5–15.5)

## 2011-03-16 LAB — BASIC METABOLIC PANEL
CO2: 30 mEq/L (ref 19–32)
Chloride: 104 mEq/L (ref 96–112)
GFR calc Af Amer: >60 mL/min (ref >60)
Potassium: 3.5 mEq/L (ref 3.5–5.1)

## 2011-03-16 LAB — PRO B NATRIURETIC PEPTIDE: Pro B Natriuretic peptide (BNP): 494.6 pg/mL — ABNORMAL HIGH (ref 0–125)
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
## 2011-03-17 ENCOUNTER — Inpatient Hospital Stay (HOSPITAL_COMMUNITY): Payer: No Typology Code available for payment source

## 2011-03-17 ENCOUNTER — Inpatient Hospital Stay (HOSPITAL_COMMUNITY): Payer: Medicare Other

## 2011-03-17 LAB — POCT OCCULT BLOOD STOOL (DEVICE): Fecal Occult Bld: NEGATIVE

## 2011-03-17 MED ORDER — TECHNETIUM TC 99M TETROFOSMIN IV KIT
30.0000 | PACK | Freq: Once | INTRAVENOUS | Status: AC | PRN
  Administered 2011-03-17: 30 via INTRAVENOUS

## 2011-03-17 MED ORDER — TECHNETIUM TC 99M TETROFOSMIN IV KIT
30.0000 | PACK | Freq: Once | INTRAVENOUS | Status: AC | PRN
  Administered 2011-03-16: 30 via INTRAVENOUS

## 2011-03-18 LAB — CBC
HCT: 39.1 % (ref 36.0–46.0)
Hemoglobin: 12.7 g/dL (ref 12.0–15.0)
MCV: 93.8 fL (ref 78.0–100.0)
RBC: 4.17 MIL/uL (ref 3.87–5.11)
WBC: 5.8 10*3/uL (ref 4.0–10.5)

## 2011-03-18 LAB — BASIC METABOLIC PANEL
Chloride: 103 mEq/L (ref 96–112)
Creatinine, Ser: 1.14 mg/dL (ref 0.4–1.2)
GFR calc Af Amer: 58 mL/min — ABNORMAL LOW (ref >60)
Potassium: 3.8 mEq/L (ref 3.5–5.1)
Sodium: 138 mEq/L (ref 135–145)

## 2011-03-18 LAB — PROTIME-INR: INR: 1.08 (ref 0.00–1.49)
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
## 2011-04-06 NOTE — Discharge Summary (Signed)
[name], [name]         ACCOUNT NO.:  [id number]  MEDICAL RECORD NO.:  [id number]           PATIENT TYPE:  I  LOCATION:  2029                         FACILITY:  [facility]  PHYSICIAN:  Darlene Belling, MD       DATE OF BIRTH:  1941-10-01  DATE OF ADMISSION:  03/15/2011 DATE OF DISCHARGE:                        DISCHARGE SUMMARY - REFERRING   PRIMARY CARE PHYSICIAN:  [name], M.D.  PRIMARY CARDIOLOGIST:  Dr. Birdie [name].  Dr. Doylene [name] was the consultant on this case.  DISCHARGE DIAGNOSES: 1. Atypical chest pain, stress test pending. 2. Syncope, echo pending. 3. Morbid obesity, counseled. 4. History of AV block and pacer, stable. 5. Debility, needing PT and OT evaluation. 6. Hemoptysis needing an outpatient pulmonary evaluation. 7. Indigestion, started PPI and NSAIDs to be stopped.  No more NSAIDs     use. 8. 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  DISCHARGE MEDICATIONS: 1. Furosemide 40 mg p.o. daily. 2. Protonix 40 mg p.o. daily. 3. Potassium chloride 20 mEq p.o. daily. 4. Aspirin 81 mg p.o. daily. 5. Benicar 40 mg p.o. daily. 6. Crestor 10 mg p.o. daily. 7. Diltiazem CD 360 mg p.o. daily. 8. Isosorbide mononitrate XR 60 mg p.o. daily. 9. Metoprolol XL 100 mg in the morning and 50 mg bedtime. 10.Zofran 4 mg p.o. twice daily as needed. 11.Plavix 75 mg p.o. daily. 12.Tramadol 50 mg p.o. twice daily. 13.Vitamin D3 over-the-counter 1 capsule p.o. daily.  DISPOSITION:  The patient's disposition is pending.  PT/OT evaluation.  DIET:  The patient's diet should be heart-healthy with 1.5 L fluid restriction.  PROCEDURES PERFORMED:  Chest x-ray showed mild cardiomegaly, no active disease.  CT of the head showed no acute intracranial abnormalities, severe chronic microvascular ischemic changes of the white matter with more focal or lacunar infarct, deep white matter of the  right frontal lobe.  The patient had nuclear stress test that is pending.  The patient has a 2-D echocardiogram that is pending.  CONSULTATIONS ON THIS CASE:  Dr. Dixie Dials.  FOLLOWUP:  The patient should follow up with the primary care physician, Dr. Willey Blade in 1 week.  The patient should also follow up with Dr. Dixie Dials in 2-3 weeks.  The patient should also obtain a referral to see a pulmonologist for hemoptysis and the patient should also get a BMET in 1 week.  The patient should see a gastroenterologist if indigestion continuous.  BRIEF HISTORY OF PRESENT ILLNESS:  Chief complaint:  Syncope.  A 70 year old [name] with past medical history of hypertension, morbid obesity, AV block pacer, also history of mild systolic and diastolic CHF, and she came to the hospital with syncope. Also, she had some nonspecific chest pain, indigestion.  HOSPITAL COURSE: 1. Atypical chest pain.  The patient was admitted to the hospital.     The patient was placed on aspirin, Plavix which are her home     medications.  The patient's cardiac enzymes were negative x3.  The     patient was seen in consultation with Dr. Dixie Dials as  the     patient is having a nuclear stress test on Mar 16, 2011.  Stress     test is negative.  No further cardiac ischemic workup will be done. 2. Syncope.  The patient has syncope.  The patient had CT of the head,     which was unremarkable.  The patient had echo that is pending. 3. Morbid obesity.  The patient was counseled. 4. History of AV block and pacer which is stable.  The patient was     seen by Dr. Doylene [name]. 5. Debility.  The patient reported to me that she lives by herself and     she has a hard time walking.  We got a PT/OT evaluation. 6. Hemoptysis.  The patient had some nonspecific hemoptysis mild,     hemoglobin was 13.6, hematocrit 41.7.  The patient would benefit     from an outpatient Pulmonology evaluation.  However,  the patient     denies any tobacco abuse. 7. Indigestion.  The patient has recently been started on NSAIDs and     NSAIDs have been stopped.  The patient will be started on Protonix     40 mg p.o. daily.  She should continue that on discharge.  Again,     the patient may possibly be discharged home on Mar 16, 2011.     However, the patient's stress test, 2-D echo, PT/OT evaluations are     still pending.     Darlene Belling, MD     NH/MEDQ  D:  03/16/2011  T:  03/16/2011  Job:  [id number]  cc:   [name], M.D. [name], M.D.  Electronically Signed by Darlene Belling MD on 04/06/2011 12:30:06 PM

## 2011-05-23 ENCOUNTER — Emergency Department (HOSPITAL_BASED_OUTPATIENT_CLINIC_OR_DEPARTMENT_OTHER)
Admission: EM | Admit: 2011-05-23 | Discharge: 2011-05-23 | Disposition: A | Discharge status: Home / Self Care | Payer: Medicare Other | Attending: Emergency Medicine | Admitting: Emergency Medicine

## 2011-05-23 ENCOUNTER — Encounter: Payer: Self-pay | Admitting: *Deleted

## 2011-05-23 DIAGNOSIS — R112 Nausea with vomiting, unspecified: Secondary | ICD-10-CM | POA: Insufficient documentation

## 2011-05-23 DIAGNOSIS — I10 Essential (primary) hypertension: Secondary | ICD-10-CM | POA: Insufficient documentation

## 2011-05-23 DIAGNOSIS — E785 Hyperlipidemia, unspecified: Secondary | ICD-10-CM | POA: Insufficient documentation

## 2011-05-23 DIAGNOSIS — R42 Dizziness and giddiness: Secondary | ICD-10-CM | POA: Insufficient documentation

## 2011-05-23 DIAGNOSIS — I2510 Atherosclerotic heart disease of native coronary artery without angina pectoris: Secondary | ICD-10-CM | POA: Insufficient documentation

## 2011-05-23 DIAGNOSIS — Z8739 Personal history of other diseases of the musculoskeletal system and connective tissue: Secondary | ICD-10-CM | POA: Insufficient documentation

## 2011-05-23 HISTORY — DX: Atherosclerotic heart disease of native coronary artery without angina pectoris: I25.10

## 2011-05-23 HISTORY — DX: Unspecified osteoarthritis, unspecified site: M19.90

## 2011-05-23 HISTORY — DX: Hyperlipidemia, unspecified: E78.5

## 2011-05-23 HISTORY — DX: Essential (primary) hypertension: I10

## 2011-05-23 LAB — BASIC METABOLIC PANEL
BUN: 19 mg/dL (ref 6–23)
CO2: 27 mEq/L (ref 19–32)
Calcium: 9.2 mg/dL (ref 8.4–10.5)
Chloride: 104 mEq/L (ref 96–112)
Creatinine, Ser: 1.1 mg/dL (ref 0.50–1.10)
GFR calc Af Amer: 60 mL/min — ABNORMAL LOW (ref >60)
GFR calc non Af Amer: 50 mL/min — ABNORMAL LOW (ref >60)
Glucose, Bld: 115 mg/dL — ABNORMAL HIGH (ref 70–99)
Potassium: 3.8 mEq/L (ref 3.5–5.1)
Sodium: 140 mEq/L (ref 135–145)

## 2011-05-23 LAB — DIFFERENTIAL
Basophils Absolute: 0 10*3/uL (ref 0.0–0.1)
Basophils Relative: 1 % (ref 0–1)
Eosinophils Absolute: 0.3 10*3/uL (ref 0.0–0.7)
Eosinophils Relative: 6 % — ABNORMAL HIGH (ref 0–5)
Lymphocytes Relative: 33 % (ref 12–46)
Lymphs Abs: 1.8 10*3/uL (ref 0.7–4.0)
Monocytes Absolute: 0.7 10*3/uL (ref 0.1–1.0)
Monocytes Relative: 12 % (ref 3–12)
Neutro Abs: 2.7 10*3/uL (ref 1.7–7.7)
Neutrophils Relative %: 49 % (ref 43–77)

## 2011-05-23 LAB — CBC
Hemoglobin: 13.4 g/dL (ref 12.0–15.0)
MCHC: 32.8 g/dL (ref 30.0–36.0)
RBC: 4.36 MIL/uL (ref 3.87–5.11)

## 2011-05-23 MED ORDER — METOCLOPRAMIDE HCL 10 MG PO TABS: 10.0000 mg | ORAL_TABLET | Freq: Four times a day (QID) | ORAL | Status: DC

## 2011-05-23 MED ORDER — MECLIZINE HCL 25 MG PO TABS: 25.0000 mg | ORAL_TABLET | Freq: Three times a day (TID) | ORAL | Status: AC | PRN

## 2011-05-23 MED ORDER — SODIUM CHLORIDE 0.9 % IV SOLN: Freq: Once | INTRAVENOUS | Status: DC

## 2011-05-23 MED ORDER — ONDANSETRON HCL 4 MG/2ML IJ SOLN: 4.0000 mg | Freq: Once | INTRAMUSCULAR | Status: DC

## 2011-05-23 MED ORDER — MECLIZINE HCL 25 MG PO TABS
25.0000 mg | ORAL_TABLET | Freq: Once | ORAL | Status: AC
  Administered 2011-05-23: 25 mg via ORAL
  Filled 2011-05-23: qty 1

## 2011-05-23 MED ORDER — GI COCKTAIL ~~LOC~~
ORAL | Status: AC
  Administered 2011-05-23: 30 mL
  Filled 2011-05-23: qty 30

## 2011-05-23 MED ORDER — METOCLOPRAMIDE HCL 5 MG/ML IJ SOLN
10.0000 mg | Freq: Once | INTRAMUSCULAR | Status: AC
  Administered 2011-05-23: 10 mg via INTRAVENOUS
  Filled 2011-05-23: qty 2

## 2011-05-23 NOTE — ED Notes (Signed)
Per EMS pt with multiple episodes of vomiting

## 2011-05-23 NOTE — ED Notes (Signed)
Pt also received 4 mg of zofran IV PTA

## 2011-05-23 NOTE — ED Provider Notes (Signed)
History     Chief Complaint  Patient presents with  . Emesis   The history is provided by the patient.  She says she woke up one hour ago with severe nausea and vertigo. Symptoms are worse when supine. Nothing makes it better.. She denies abdominal pain or chest pain. She has vomited several times. She came in by ambulance and received Zofran with no relief. No fever or chills. Symptoms are severe.  Past Medical History  Diagnosis Date  . Coronary artery disease   . Hypertension   . Hyperlipemia   . Arthritis     Past Surgical History  Procedure Date  . Pacemaker insertion   . Pacemaker insertion     History reviewed. No pertinent family history.  History  Substance Use Topics  . Smoking status: Never Smoker   . Smokeless tobacco: Not on file  . Alcohol Use: No    OB History    Grav Para Term Preterm Abortions TAB SAB Ect Mult Living                  Review of Systems  All other systems reviewed and are negative.    Physical Exam  BP 147/79  Pulse 70  Temp(Src) 98.6 F (37 C) (Oral)  Resp 16  SpO2 94%  Physical Exam  Constitutional: She is oriented to person, place, and time. She appears well-developed and well-nourished.  HENT:  Head: Normocephalic and atraumatic.  Mouth/Throat: Oropharynx is clear and moist.  Eyes: Conjunctivae and EOM are normal. Pupils are equal, round, and reactive to light. No scleral icterus.  Neck: Normal range of motion. Neck supple. No JVD present.  Cardiovascular: Normal rate, regular rhythm and normal heart sounds.   No murmur heard. Pulmonary/Chest: Effort normal and breath sounds normal. She has no wheezes. She has no rales.  Abdominal: Soft. She exhibits no mass. There is no tenderness.       Bowel sounds are hypoactive. She is holding an emesis basin, but not actively vomiting.  Musculoskeletal: Normal range of motion.       2pretibial edema.  Lymphadenopathy:    She has no cervical adenopathy.  Neurological: She is  alert and oriented to person, place, and time. She has normal reflexes. No cranial nerve deficit. Coordination normal.       Dizziness is worse with head movement.  Skin: Skin is warm and dry. No rash noted.  Psychiatric: She has a normal mood and affect.    ED Course  Procedures  MDM Given Zofran for nausea with no relief, given Reglan with good relief. Given Meclizine with good relief of Vertigo. Lab results reviewed.  Results for orders placed during the hospital encounter of 05/23/11  CBC      Component Value Range   WBC 5.5  4.0 - 10.5 (K/uL)   RBC 4.36  3.87 - 5.11 (MIL/uL)   Hemoglobin 13.4  12.0 - 15.0 (g/dL)   HCT 40.8  36.0 - 46.0 (%)   MCV 93.6  78.0 - 100.0 (fL)   MCH 30.7  26.0 - 34.0 (pg)   MCHC 32.8  30.0 - 36.0 (g/dL)   RDW 14.8  11.5 - 15.5 (%)   Platelets 179  150 - 400 (K/uL)  DIFFERENTIAL      Component Value Range   Neutrophils Relative 49  43 - 77 (%)   Neutro Abs 2.7  1.7 - 7.7 (K/uL)   Lymphocytes Relative 33  12 - 46 (%)   Lymphs  Abs 1.8  0.7 - 4.0 (K/uL)   Monocytes Relative 12  3 - 12 (%)   Monocytes Absolute 0.7  0.1 - 1.0 (K/uL)   Eosinophils Relative 6 (*) 0 - 5 (%)   Eosinophils Absolute 0.3  0.0 - 0.7 (K/uL)   Basophils Relative 1  0 - 1 (%)   Basophils Absolute 0.0  0.0 - 0.1 (K/uL)  BASIC METABOLIC PANEL      Component Value Range   Sodium 140  135 - 145 (mEq/L)   Potassium 3.8  3.5 - 5.1 (mEq/L)   Chloride 104  96 - 112 (mEq/L)   CO2 27  19 - 32 (mEq/L)   Glucose, Bld 115 (*) 70 - 99 (mg/dL)   BUN 19  6 - 23 (mg/dL)   Creatinine, Ser 1.10  0.50 - 1.10 (mg/dL)   Calcium 9.2  8.4 - 10.5 (mg/dL)   GFR calc non Af Amer 50 (*) >60 (mL/min)   GFR calc Af Amer 60 (*) >60 (mL/min)   No results found.        Delora Fuel, MD [id number]

## 2011-06-12 ENCOUNTER — Emergency Department (HOSPITAL_COMMUNITY): Payer: Medicare Other

## 2011-06-12 ENCOUNTER — Inpatient Hospital Stay (HOSPITAL_COMMUNITY): Payer: Medicare Other

## 2011-06-12 ENCOUNTER — Inpatient Hospital Stay (HOSPITAL_COMMUNITY)
Admission: EM | Admit: 2011-06-12 | Discharge: 2011-06-21 | DRG: 149 | Disposition: A | Discharge status: Skilled Nursing Facility | Payer: Medicare Other | Attending: Internal Medicine | Admitting: Internal Medicine

## 2011-06-12 DIAGNOSIS — I252 Old myocardial infarction: Secondary | ICD-10-CM

## 2011-06-12 DIAGNOSIS — R42 Dizziness and giddiness: Principal | ICD-10-CM | POA: Diagnosis present

## 2011-06-12 DIAGNOSIS — Z950 Presence of cardiac pacemaker: Secondary | ICD-10-CM

## 2011-06-12 DIAGNOSIS — Z7982 Long term (current) use of aspirin: Secondary | ICD-10-CM

## 2011-06-12 DIAGNOSIS — N179 Acute kidney failure, unspecified: Secondary | ICD-10-CM | POA: Diagnosis present

## 2011-06-12 DIAGNOSIS — E785 Hyperlipidemia, unspecified: Secondary | ICD-10-CM | POA: Diagnosis present

## 2011-06-12 DIAGNOSIS — I6509 Occlusion and stenosis of unspecified vertebral artery: Secondary | ICD-10-CM | POA: Diagnosis present

## 2011-06-12 DIAGNOSIS — E7800 Pure hypercholesterolemia, unspecified: Secondary | ICD-10-CM | POA: Diagnosis present

## 2011-06-12 DIAGNOSIS — I509 Heart failure, unspecified: Secondary | ICD-10-CM | POA: Diagnosis present

## 2011-06-12 DIAGNOSIS — Z79899 Other long term (current) drug therapy: Secondary | ICD-10-CM

## 2011-06-12 DIAGNOSIS — I5032 Chronic diastolic (congestive) heart failure: Secondary | ICD-10-CM | POA: Diagnosis present

## 2011-06-12 DIAGNOSIS — Z7902 Long term (current) use of antithrombotics/antiplatelets: Secondary | ICD-10-CM

## 2011-06-12 DIAGNOSIS — I10 Essential (primary) hypertension: Secondary | ICD-10-CM | POA: Diagnosis present

## 2011-06-12 DIAGNOSIS — Z8673 Personal history of transient ischemic attack (TIA), and cerebral infarction without residual deficits: Secondary | ICD-10-CM

## 2011-06-12 HISTORY — DX: Transient cerebral ischemic attack, unspecified: G45.9

## 2011-06-12 HISTORY — DX: Presence of cardiac pacemaker: Z95.0

## 2011-06-12 HISTORY — DX: Dizziness and giddiness: R42

## 2011-06-12 HISTORY — DX: Acute myocardial infarction, unspecified: I21.9

## 2011-06-12 HISTORY — DX: Pure hypercholesterolemia, unspecified: E78.00

## 2011-06-12 HISTORY — DX: Heart failure, unspecified: I50.9

## 2011-06-12 LAB — HEPATIC FUNCTION PANEL
ALT: 16 U/L (ref 0–35)
AST: 37 U/L (ref 0–37)
Alkaline Phosphatase: 97 U/L (ref 39–117)
Bilirubin, Direct: <0.1 mg/dL (ref 0.0–0.3)

## 2011-06-12 LAB — BASIC METABOLIC PANEL
CO2: 23 mEq/L (ref 19–32)
Chloride: 105 mEq/L (ref 96–112)
Potassium: 3.5 mEq/L (ref 3.5–5.1)
Sodium: 140 mEq/L (ref 135–145)

## 2011-06-12 LAB — CBC
Hemoglobin: 12.7 g/dL (ref 12.0–15.0)
MCH: 30.5 pg (ref 26.0–34.0)
MCV: 93.3 fL (ref 78.0–100.0)
RBC: 4.17 MIL/uL (ref 3.87–5.11)

## 2011-06-12 LAB — DIFFERENTIAL
Lymphocytes Relative: 24 % (ref 12–46)
Lymphs Abs: 2.1 10*3/uL (ref 0.7–4.0)
Monocytes Relative: 6 % (ref 3–12)
Neutro Abs: 6.1 10*3/uL (ref 1.7–7.7)
Neutrophils Relative %: 70 % (ref 43–77)

## 2011-06-13 ENCOUNTER — Inpatient Hospital Stay (HOSPITAL_COMMUNITY): Payer: Medicare Other

## 2011-06-13 LAB — CBC
HCT: 42.7 % (ref 36.0–46.0)
MCH: 30.7 pg (ref 26.0–34.0)
MCV: 93 fL (ref 78.0–100.0)
Platelets: 186 10*3/uL (ref 150–400)
RBC: 4.59 MIL/uL (ref 3.87–5.11)
RDW: 15 % (ref 11.5–15.5)

## 2011-06-13 LAB — COMPREHENSIVE METABOLIC PANEL
AST: 27 U/L (ref 0–37)
BUN: 12 mg/dL (ref 6–23)
CO2: 28 mEq/L (ref 19–32)
Calcium: 9.7 mg/dL (ref 8.4–10.5)
Creatinine, Ser: 1.03 mg/dL (ref 0.50–1.10)
GFR calc Af Amer: >60 mL/min (ref >60)
GFR calc non Af Amer: 53 mL/min — ABNORMAL LOW (ref >60)
Total Bilirubin: 0.4 mg/dL (ref 0.3–1.2)

## 2011-06-14 ENCOUNTER — Inpatient Hospital Stay (HOSPITAL_COMMUNITY): Payer: Medicare Other

## 2011-06-14 ENCOUNTER — Encounter (HOSPITAL_COMMUNITY): Payer: Self-pay | Admitting: Radiology

## 2011-06-14 MED ORDER — IOHEXOL 350 MG/ML SOLN
75.0000 mL | Freq: Once | INTRAVENOUS | Status: AC | PRN
  Administered 2011-06-14: 75 mL via INTRAVENOUS

## 2011-06-15 DIAGNOSIS — R42 Dizziness and giddiness: Secondary | ICD-10-CM

## 2011-06-16 LAB — BASIC METABOLIC PANEL
BUN: 13 mg/dL (ref 6–23)
Chloride: 106 mEq/L (ref 96–112)
GFR calc Af Amer: 56 mL/min — ABNORMAL LOW (ref >60)
GFR calc non Af Amer: 46 mL/min — ABNORMAL LOW (ref >60)
Potassium: 3.4 mEq/L — ABNORMAL LOW (ref 3.5–5.1)
Sodium: 141 mEq/L (ref 135–145)

## 2011-06-16 LAB — APTT: aPTT: 33 seconds (ref 24–37)

## 2011-06-16 LAB — CBC
HCT: 42.2 % (ref 36.0–46.0)
Hemoglobin: 13.6 g/dL (ref 12.0–15.0)
RBC: 4.53 MIL/uL (ref 3.87–5.11)
WBC: 5.5 10*3/uL (ref 4.0–10.5)

## 2011-06-16 LAB — PROTIME-INR: INR: 0.97 (ref 0.00–1.49)

## 2011-06-17 ENCOUNTER — Inpatient Hospital Stay (HOSPITAL_COMMUNITY): Payer: Medicare Other

## 2011-06-17 LAB — BASIC METABOLIC PANEL
Chloride: 107 mEq/L (ref 96–112)
Creatinine, Ser: 1.12 mg/dL — ABNORMAL HIGH (ref 0.50–1.10)
GFR calc Af Amer: 59 mL/min — ABNORMAL LOW (ref >60)
Potassium: 4.1 mEq/L (ref 3.5–5.1)
Sodium: 141 mEq/L (ref 135–145)

## 2011-06-17 MED ORDER — IOHEXOL 300 MG/ML  SOLN
150.0000 mL | Freq: Once | INTRAMUSCULAR | Status: AC | PRN
  Administered 2011-06-17: 90 mL via INTRAVENOUS

## 2011-06-19 ENCOUNTER — Inpatient Hospital Stay (HOSPITAL_COMMUNITY): Payer: Medicare Other

## 2011-06-19 NOTE — Consult Note (Signed)
[name], [name] NO.:  [id number]  MEDICAL RECORD NO.:  [id number]  LOCATION:  [id number]                         FACILITY:  [facility]  PHYSICIAN:  Alexis Goodell, MD    DATE OF BIRTH:  30-May-1941  DATE OF CONSULTATION:  06/14/2011 DATE OF DISCHARGE:                                CONSULTATION   REASON FOR CONSULTATION:  Vertigo.  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  The patient was admitted to [facility] secondary to sudden onset of vertical vertigo associated with nausea, vomiting.  On speaking with the patient, she states that she has been having vertiginous sensations for greater than 1 year.  Her usual vertiginous sensation is vertical in nature.  It is intermittent usually lasting for less than a day and often abated by using meclizine.  She does oftentimes go to her primary care physician who prescribes her medications for these bouts.  However, she is unable to give me the exact names of the medications that she gets.  The patient states that on Friday night she had turn her head to the left and heard a "pop."  After hearing a pop, she noted a rushing sound in her left ear and noted significant vertiginous sensation of the floor moving from inferior to superior aspect.  The patient got out of her bed and noted that the room was moving in a vertical like manner.  This caused her to fall onto her knees and arms.  She did put significant amount of weight on her right arm as she fell.  After falling, she noted tingling in her right arm from the forearm into the small finger and ring finger, but clearly notes that the tingling did not cross into the middle finger, index finger, or the thumb.  The patient states that this tingling stopped, but the vertigo did not.  She called  the EMS who brought her to [facility] with this vertigo was multiple episodes of nausea, vomiting.  The patient has been in the hospital now since June 12, 2011, and she has received meclizine.  The patient states that her vertigo has improved, but is still present when she turns her head to the left.  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  MEDICATIONS WHILE IN THE HOSPITAL:  She has been placed on aspirin, Plavix, diltiazem, Lovenox, Apresoline, isosorbide mononitrate, meclizine q.8 h, Reglan, Lopressor, Benicar, Protonix, Crestor, and Zofran.  ALLERGIES:  AMPICILLIN.  SOCIAL HISTORY:  The patient denies any tobacco abuse, illicit drug use, or alcohol abuse.  REVIEW OF SYSTEMS:  Negative with the exception of above.  PHYSICAL EXAMINATION:  VITAL SIGNS:  Blood pressure is 121/77, pulse 68, respirations 18, temperature 98.2. GENERAL:  She is alert and oriented.  She carries out 2 and 3-step commands. CRANIAL NERVES:  round, and reactive to light and accommodating. Conjugate gaze.  Extraocular movements are intact.  Visual fields grossly intact.  Face symmetrical.  Tongue is midline.  Uvula is midline.  I do not know any dysarthria or aphasia.  I do not know any significant nystagmus.  Facial sensation is full to pinprick, light touch.  Shoulder shrug, head turn is within normal limits. COORDINATION:  Finger-to-nose is smooth.  Heel-to-shin is smooth.  Fine motor movement is smooth.  Motor is 5/5 throughout.  Deep tendon reflexes are depressed throughout.  The patient does not have a drift in the upper extremities.  Lower extremities, the patient has significant difficulty with both legs secondary to body habitus.  Sensation is full to pinprick and light touch throughout. PULMONARY:  Clear to auscultation. CARDIOVASCULAR:  S1  and S2 were audible.  LABORATORY DATA:  Sodium 142, potassium 3.7, chloride 106, glucose 110, CO2 is 28, BUN is 12, creatinine 1.03, AST is 27, ALT is 15, protein is 7.4.  White blood cell count is 7.7, platelets 186, hemoglobin 14.1, hematocrit is 42.7.  TSH is 1.4.  CT of head obtained on June 12, 2011, shows no acute intracranial abnormality, stable chronic small vessel disease in old right frontal deep periventricular matter infarct.  CTA of head and neck are pending.  ASSESSMENT:  This is a 70 year old African American female with greater than 1-year history of intermittent vertical vertigo.  The patient has been on meclizine for quite some time and has been doing well with meclizine to abate her vertiginous sensations.  This past Friday, the patient noted a sudden onset of vertical vertigo severe enough to cause the patient to fall and had multiple episodes of nausea and vomiting. Initial CT of head was negative for stroke or bleed.  At this time due to the vertical nature of her vertiginous sensation, would like to fully evaluate the patient's posterior circulation.  For that reason;  RECOMMENDATIONS: 1. CT angiogram of head and neck to evaluate posterior circulation. 2. If there are no abnormalities with the CT angio of head and neck,     recommendations include;     a.     Ear, nose, throat consult.     b.     Possible trial of low-dose Valium, such as 5 mg b.i.d. if      needed. 3. Recommend PT for gait and vestibular rehab. 4. We would recommend continuing aspirin and Plavix as directed by     cardiology.  Dr. Doy Mince has seen and evaluated the patient and agrees with the above mentioned.     Etta Quill, PA-C   ______________________________ Alexis Goodell, MD    DS/MEDQ  D:  06/14/2011  T:  06/15/2011  Job:  [id number]  Electronically Signed by Etta Quill PA-C on 06/15/2011 10:02:17 AM Electronically Signed by Alexis Goodell MD on 06/19/2011 09:57:47  PM

## 2011-06-20 LAB — BASIC METABOLIC PANEL
CO2: 28 mEq/L (ref 19–32)
Calcium: 8.9 mg/dL (ref 8.4–10.5)
Chloride: 104 mEq/L (ref 96–112)
Potassium: 3.4 mEq/L — ABNORMAL LOW (ref 3.5–5.1)
Sodium: 140 mEq/L (ref 135–145)

## 2011-06-20 LAB — PRO B NATRIURETIC PEPTIDE: Pro B Natriuretic peptide (BNP): 225.1 pg/mL — ABNORMAL HIGH (ref 0–125)

## 2011-06-21 LAB — BASIC METABOLIC PANEL
CO2: 25 mEq/L (ref 19–32)
Calcium: 9.3 mg/dL (ref 8.4–10.5)
Sodium: 139 mEq/L (ref 135–145)

## 2011-06-23 NOTE — Consult Note (Signed)
[name], [name]         ACCOUNT NO.:  [id number]  MEDICAL RECORD NO.:  [id number]  LOCATION:  [id number]                         FACILITY:  [facility]  PHYSICIAN:  Leta Baptist, MD            DATE OF BIRTH:  11/26/1940  DATE OF CONSULTATION:  06/15/2011 DATE OF DISCHARGE:                                CONSULTATION   CHIEF COMPLAINT:  Dizziness.  HISTORY OF PRESENT ILLNESS:  The patient is a pleasant 70 year old female who was originally admitted to the [facility] on June 12, 2011 for severe vertigo, syncope, and fall.  According to the patient, she had an episode of severe vertigo when she was going to bed and lifted her head.  She heard a popping sensation in her left neck/head.  It resulted in the severe spinning vertigo that lasted for 15-20 minutes.  The patient also vomited several times, accompanied by loose bowel movement.  The patient previously had a history of frequent recurrent vertigo.  However, her previous episodes typically lasted several seconds at a time.  She was treated with meclizine.  The patient was previously seen by an ENT doctor in [facility] approximately 15 years ago.  She apparently had a tube placed on the right side.  The tube has since extruded.  She denies any recent otologic surgery.  She also complains of decreasing hearing bilaterally, worse on the right side.  She has occasional right-sided oral fullness.  She denies any sudden change in her hearing over the last several weeks.  At the time of admission, the patient was noted to be very drowsy.  She denied any chest pain, shortness of breath, visual change, or abdominal pain.  She underwent a head CT scan, which was negative for any acute stroke or CNS lesion.  The patient could not undergo the MRI scan secondary to her pacemaker.  She subsequently underwent an ultrasound evaluation, which showed no carotid artery stenosis.  However, her left vertebral artery showed loss of diastolic  flow.  The results were suggestive of possible left distal vertebral artery occlusion.  PAST MEDICAL HISTORY: 1. Congestive heart failure. 2. Hypertension. 3. Hypercholesterolemia. 4. Myocardial infarctions. 5. History of pacemaker placement. 6. TIA.  PAST SURGICAL HISTORY:  Pacemaker placement.  No history of ENT surgery.  HOME MEDICATION: 1. Benicar. 2. Clopidogrel. 3. Crestor. 4. Isosorbide. 5. Lasix. 6. Meclizine. 7. Metoclopramide. 8. Metoprolol. 9. Odansetron. 10.Tramadol.  ALLERGIES:  AMPICILLIN.  SOCIAL HISTORY:  The patient denies any tobacco abuse, illegal drug use, or alcohol abuse.  REVIEW OF SYSTEMS:  No history of central nervous system deficits, thyroid problem, COPD, emphysema, or GU symptoms.  Her cardiac, neurologic, and GI symptoms are as noted in HPI.  PHYSICAL EXAMINATION:  GENERAL:  The patient is a morbidly obese, but pleasant 71 year old female in no acute distress.  She is alert and oriented x3.  HEENT:  Her pupils are equal, round, and reactive to light.  Extraocular motion is intact.  Examination of the ears shows normal auricles, external auditory canals, and tympanic membranes bilaterally.  No middle ear effusion is noted.  Nasal examination shows normal mucosa, septum, and turbinates.  Oral cavity examination shows normal lips,  gums, tongue, oral cavity, oropharyngeal mucosa. NECK:  Palpation of the neck reveals no lymphadenopathy or mass.  Her trachea is midline.  Her thyroid bed is normal to palpation.  No significant mass is noted. NEUROLOGIC:  Cranial nerves II through XII are grossly intact. Cerebellar examination is unremarkable.  It should be noted that the examination is carried out in her bed.  The patient is non mobile.  Dix- Hallpike testing is negative bilaterally.  No significant vertigo or nystagmus is noted.  IMPRESSION:  Based on the above history and physical exam findings, the differential diagnosis for the patient's  recurrent vertigo include vertebral insufficiency, Meniere disease, benign positional paroxysmal vertigo, or other multifactorial central etiology.  The patient's [facility] testing is currently negative.  The Meniere disease cannot be ruled out without more audiologic or vestibular testing.  RECOMMENDATIONS: 1. The patient should proceed with angiography to evaluate for     possible left vertebral insufficiency. 2. The patient will follow up in my office for further audiologic and     vestibular evaluation.  We could perform a more complete     audiometric evaluation in the office setting. 3. No other acute intervention is recommended at this time.     Leta Baptist, MD     ST/MEDQ  D:  06/15/2011  T:  06/16/2011  Job:  [id number]  Electronically Signed by Leta Baptist MD on 06/23/2011 12:28:04 PM

## 2011-06-24 ENCOUNTER — Other Ambulatory Visit (HOSPITAL_COMMUNITY): Payer: Self-pay | Admitting: Internal Medicine

## 2011-06-30 NOTE — Discharge Summary (Signed)
[name], [name]         ACCOUNT NO.:  [id number]  MEDICAL RECORD NO.:  [id number]  LOCATION:  [id number]                         FACILITY:  [facility]  PHYSICIAN:  Verlee Monte, MD       DATE OF BIRTH:  02/09/1941  DATE OF ADMISSION:  06/12/2011 DATE OF DISCHARGE:                        DISCHARGE SUMMARY - REFERRING   PRIMARY CARE PHYSICIAN:  Royetta Crochet. Karlton Lemon, MD  REASON FOR ADMISSION:  Dizziness.  DISCHARGE DIAGNOSES: 1. Dizziness/vertigo, improving. 2. Left vertebral artery occlusion. 3. Malignant hypertension. 4. 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 5. Lower extremity edema. 6. Hypercholesterolemia. 7. History of myocardial infarction. 8. History of transient ischemic attack. 9. Chronic diastolic congestive heart failure.  RADIOLOGIC DATA: 1. CT head on June 19, 2011 showed no acute findings, stable     compared to prior exam.  No chronic microvascular ischemic changes     and remote small right frontal infarct. 2. Conventional catheter angiogram showed angiographically occluded     hypoplastic left vertebral artery with attempt to reconstitution     distally at the level of C1-C2 without distal opacification.  There     are dominant posterior communicating arteries bilaterally with     retrograde opacification of the distal one-third of the basilar     artery. 3. CT angio of the head on June 15, 2011 showed diminutive distal     left vertebral which may relate to congenital small vessel which     supplies the left posterior-inferior cerebellar artery.  There is     no acute cerebellar stroke, chronic microvascular ischemic changes     and chronic right frontal infarct.  CT angiography of the head     showed widely patent carotid arteries and right vertebral.  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 4. Ultrasound of abdomen showed no acute findings, stable compared to     prior exam.  Ultrasound of abdomen  showed no evidence of acute     cholecystitis, a small right renal cyst, no hydronephrosis, portion     of pancreas, aorta was not visualized because of underlying bowel     gas. 5. CT head on admission showed no acute intracranial abnormality, very     stable with chronic small-vessel disease, old right frontal deep     periventricular matter infarct. 6. Echocardiogram showed normal systolic ejection fraction, was not     quantified in the study.  Wall motion is normal.  There is grade 1     diastolic dysfunction.  DISCHARGE MEDICATIONS: 1. Hydralazine 25 mg every 8 hours. 2. Hydrocodone/APAP 5/325 mg 1-2 tablets every 4 hours as needed for     pain. 3. Metoprolol 100 mg p.o. b.i.d. 4. Aspirin 81 mg p.o. daily. 5. Benicar 40 mg p.o. daily. 6. Crestor 10 mg p.o. daily. 7. Diltiazem 360 mg p.o. daily. 8. Furosemide 40 mg p.o. daily. 9. Isosorbide mononitrate XR 60 mg p.o. daily. 10.Ondansetron 8 mg p.o. b.i.d. as needed for nausea. 11.Pantoprazole 40 mg p.o. daily. 12.Plavix 75 mg p.o. daily. 13.Potassium chloride 20 mEq p.o. daily. 14.Vitamin D3 OTC 1 capsule p.o. daily.  CONSULTATIONS: 1. Interventional Radiology, Dr. Luanne Bras. 2.  Neuro Hospitalist, Dr. Alexis Goodell. 3. ENT service, Dr. Leta Baptist. 4. Vascular Surgery, Dr. Trula Slade.  BRIEF HISTORY AND EXAMINATION:  [name] is a 70 year old African American female with past medical history of hypertension, chronic diastolic congestive heart failure.  The patient came into the hospital complaining about episode of dizziness and vertigo.  The patient says about to go to bed when she started feeling very dizzy and she also had an episode of vomiting.  The patient says she vomited about 5 times, loose bowel movement and she came into the hospital at this time.  At the time of admission, the patient was still dizzy, says the dizziness is worse with movement of the head and the patient is taking meclizine at home  before.  She said this is similar to previous dizziness she had. The patient at this time is very drowsy but she was providing the history.  The patient was admitted to the hospital for further evaluation.  BRIEF HOSPITAL COURSE: 1. Dizziness/vertigo.  The patient was admitted to the hospital.  CT     scan of the head was obtained which showed there is no cerebellar     stroke.  The patient has permanent pacemaker, so MRI was not able     to be done.  CT angiography of the head was done and it showed no     acute stroke but showed left vertebral artery questionable     occlusion versus dissection.  The patient was on aspirin and Plavix     at that time.  Neurology recommended to continue on that.     Interventional Radiology recommended to obtain conventional     catheter angiogram to evaluate the left vertebral artery.  The     catheter angiogram was obtained which showed occluded left     vertebral artery and I consulted Dr. Trula Slade from Vascular Surgery     who recommended no intervention and continue aspirin and Plavix.     Dr. Benjamine Mola from ENT evaluated the patient and thought this might not     be a peripheral cause or related to the middle ear problems.  She     did not rule out Menierre disease.  The patient's Dix-Hallpike     testing was negative at the time she was evaluated and the patient     needs a followup with Dr. Benjamine Mola for further audiologic and     vestibular testing as outpatient.  Currently, the patient's     dizziness and vertigo is still there but improved very much since     last time.  Note, that the CT scan also was repeated on June 19, 2011 which showed no strokes. 2. Left vertebral artery occlusion as above.  Aspirin and Plavix and     no intervention per Vascular Surgery and Neurology. 3. Hypertension.  Hypertension was difficult to control.  The patient     on multiple hypertensive medications.  Blood pressure is well     controlled in the hospital.   Please refer to the discharge     medication for the medication list. 4. Chronic diastolic heart failure.  The patient does have lower     extremity edema but she is not on all frank failure.  She does not     have pulmonary edema.  Her Lasix was restarted at the time of     discharge.  Her renal function is okay.  BUN is 16 and  creatinine     0.9 at time of discharge.  Please check her BMET every now and then     to ensure that she is having adequate potassium and creatinine     level.  DISCHARGE INSTRUCTIONS: 1. Activity:  As tolerated. 2. Disposition:  Skilled nursing facility. 3. Diet:  Heart-healthy diet.     Verlee Monte, MD     ME/MEDQ  D:  06/21/2011  T:  06/21/2011  Job:  [id number]  cc:   Royetta Crochet. Karlton Lemon, M.D.  Electronically Signed by Verlee Monte  on 06/30/2011 03:07:25 PM

## 2011-07-05 ENCOUNTER — Ambulatory Visit (HOSPITAL_COMMUNITY)
Admission: RE | Admit: 2011-07-05 | Discharge: 2011-07-05 | Disposition: A | Discharge status: Home / Self Care | Payer: Medicare Other | Source: Ambulatory Visit | Attending: Internal Medicine | Admitting: Internal Medicine

## 2011-07-05 DIAGNOSIS — R1310 Dysphagia, unspecified: Secondary | ICD-10-CM | POA: Insufficient documentation

## 2011-07-17 ENCOUNTER — Encounter (HOSPITAL_BASED_OUTPATIENT_CLINIC_OR_DEPARTMENT_OTHER): Payer: Self-pay | Admitting: Emergency Medicine

## 2011-07-17 ENCOUNTER — Emergency Department (HOSPITAL_BASED_OUTPATIENT_CLINIC_OR_DEPARTMENT_OTHER)
Admission: EM | Admit: 2011-07-17 | Discharge: 2011-07-17 | Disposition: A | Discharge status: Home / Self Care | Payer: Medicare Other | Attending: Emergency Medicine | Admitting: Emergency Medicine

## 2011-07-17 ENCOUNTER — Emergency Department (INDEPENDENT_AMBULATORY_CARE_PROVIDER_SITE_OTHER): Payer: Medicare Other

## 2011-07-17 DIAGNOSIS — E7800 Pure hypercholesterolemia, unspecified: Secondary | ICD-10-CM | POA: Insufficient documentation

## 2011-07-17 DIAGNOSIS — E785 Hyperlipidemia, unspecified: Secondary | ICD-10-CM | POA: Insufficient documentation

## 2011-07-17 DIAGNOSIS — R42 Dizziness and giddiness: Secondary | ICD-10-CM

## 2011-07-17 DIAGNOSIS — I10 Essential (primary) hypertension: Secondary | ICD-10-CM | POA: Insufficient documentation

## 2011-07-17 DIAGNOSIS — Z8679 Personal history of other diseases of the circulatory system: Secondary | ICD-10-CM | POA: Insufficient documentation

## 2011-07-17 DIAGNOSIS — Z79899 Other long term (current) drug therapy: Secondary | ICD-10-CM | POA: Insufficient documentation

## 2011-07-17 DIAGNOSIS — I252 Old myocardial infarction: Secondary | ICD-10-CM | POA: Insufficient documentation

## 2011-07-17 DIAGNOSIS — I509 Heart failure, unspecified: Secondary | ICD-10-CM | POA: Insufficient documentation

## 2011-07-17 DIAGNOSIS — I2510 Atherosclerotic heart disease of native coronary artery without angina pectoris: Secondary | ICD-10-CM | POA: Insufficient documentation

## 2011-07-17 DIAGNOSIS — Z8673 Personal history of transient ischemic attack (TIA), and cerebral infarction without residual deficits: Secondary | ICD-10-CM

## 2011-07-17 HISTORY — DX: Morbid (severe) obesity due to excess calories: E66.01

## 2011-07-17 MED ORDER — SODIUM CHLORIDE 0.9 % IV BOLUS (SEPSIS)
500.0000 mL | Freq: Once | INTRAVENOUS | Status: AC
  Administered 2011-07-17: 500 mL via INTRAVENOUS

## 2011-07-17 MED ORDER — MECLIZINE HCL 25 MG PO TABS
25.0000 mg | ORAL_TABLET | Freq: Once | ORAL | Status: AC
  Administered 2011-07-17: 25 mg via ORAL
  Filled 2011-07-17: qty 1

## 2011-07-17 MED ORDER — ONDANSETRON HCL 4 MG/2ML IJ SOLN
4.0000 mg | Freq: Once | INTRAMUSCULAR | Status: AC
  Administered 2011-07-17: 4 mg via INTRAVENOUS
  Filled 2011-07-17: qty 2

## 2011-07-17 MED ORDER — DIAZEPAM 5 MG/ML IJ SOLN
2.5000 mg | Freq: Once | INTRAMUSCULAR | Status: AC
  Administered 2011-07-17: 2.5 mg via INTRAVENOUS
  Filled 2011-07-17: qty 2

## 2011-07-17 NOTE — ED Notes (Signed)
Pt reports just finishing Z pack for sinus infection.

## 2011-07-17 NOTE — ED Provider Notes (Signed)
History     CSN: [id number] Arrival date & time: 07/17/2011  3:29 AM  Chief Complaint  Patient presents with  . Dizziness    HPI  (Consider location/radiation/quality/duration/timing/severity/associated sxs/prior treatment)  HPI Comments: 69 year old female history of vertigo, coronary artery disease, hypertension, TIA, left vertebral artery occlusion on aspirin and Plavix presents with dizziness. She describes it as room spinning, worse with head movement. There was lying still. She denies any neck pain, ear pain, and changes in hearing. Denies fevers or chills. He is experienced nausea but no vomiting. Patient with recent hospital admission for identical symptoms. She had initial CT head 8/18 which was negative and a followup CT scan 8/25 which was also negative for acute infarct. MRI cannot be obtained secondary to her pacemaker. She was seen by ENT for this vertigo during her admission, they did not think that this was peripheral. She was found to initially have a left vertebral artery occlusion and vascular surgery was consulted. Per the discharge note did not recommend surgical intervention but to continue her aspirin and Plavix. Patient was discharged to a skilled nursing facility. She states that she's had daily vertigo even since discharge worse last night. She is at least MR primary care doctor for these symptoms 2 days ago and prescribed a scopolamine patch which she states was not helping prior to her arrival. She denies any recent head trauma. States that her symptoms feel identical to her vertigo and headache previously but that the right side of her head hurts a little more than usual. She denies chest pain, shortness of breath, palpitations. She is taking the Z-Pak currently for "sinus infection".   Past Medical History  Diagnosis Date  . Coronary artery disease   . Hypertension   . Hyperlipemia   . Arthritis   . CHF (congestive heart failure)   . Vertigo   . TIA (transient  ischemic attack)   . Pacemaker   . Hypercholesterolemia   . MI (myocardial infarction)   . Morbid obesity     Past Surgical History  Procedure Date  . Pacemaker insertion   . Pacemaker insertion     No family history on file.  History  Substance Use Topics  . Smoking status: Never Smoker   . Smokeless tobacco: Not on file  . Alcohol Use: No    OB History    Grav Para Term Preterm Abortions TAB SAB Ect Mult Living                  Review of Systems  Review of Systems  All other systems reviewed and are negative.   except as noted HPI  Allergies  Ampicillin and Penicillins  Home Medications   Current Outpatient Rx  Name Route Sig Dispense Refill  . ASPIRIN 81 MG PO TABS Oral Take 81 mg by mouth daily.      [name] Kitchen CLOPIDOGREL BISULFATE 75 MG PO TABS Oral Take 75 mg by mouth daily.     [name] Kitchen DILTIAZEM HCL COATED BEADS 360 MG PO CP24 Oral Take 360 mg by mouth daily.      [name] Kitchen FLUTICASONE PROPIONATE 50 MCG/ACT NA SUSP Nasal Place 2 sprays into the nose daily.      . FUROSEMIDE 40 MG PO TABS Oral Take 40 mg by mouth 2 (two) times daily.      [name] Kitchen HYDRALAZINE HCL 25 MG PO TABS Oral Take 25 mg by mouth 3 (three) times daily.      [name] Kitchen HYDROCODONE-ACETAMINOPHEN 5-325 MG PO  TABS Oral Take 1 tablet by mouth every 6 (six) hours as needed.      . ISOSORBIDE MONONITRATE CR 60 MG PO TB24 Oral Take 60 mg by mouth daily.      [name] Kitchen MECLIZINE HCL 25 MG PO TABS Oral Take 25 mg by mouth 3 (three) times daily as needed.      [name] Kitchen METOCLOPRAMIDE HCL 10 MG PO TABS Oral Take 10 mg by mouth 4 (four) times daily.      [name] Kitchen METOPROLOL SUCCINATE 100 MG PO TB24 Oral Take 100 mg by mouth daily.      [name] Kitchen OLMESARTAN MEDOXOMIL 40 MG PO TABS Oral Take 40 mg by mouth daily.      [name] Kitchen PANTOPRAZOLE SODIUM 40 MG PO TBEC Oral Take 40 mg by mouth daily.      [name] Kitchen POTASSIUM CHLORIDE 10 MEQ PO CPCR Oral Take 10 mEq by mouth 2 (two) times daily.      [name] Kitchen ROSUVASTATIN CALCIUM 10 MG PO TABS Oral Take 10 mg by mouth daily.      . SCOPOLAMINE  BASE 1.5 MG TD PT72 Transdermal Place 1 patch onto the skin every 3 (three) days.      . TRAMADOL HCL 50 MG PO TABS Oral Take 50 mg by mouth every 6 (six) hours as needed.      [name] Kitchen ONDANSETRON HCL 8 MG PO TABS Oral Take by mouth every 8 (eight) hours as needed.        Physical Exam    BP 164/81  Pulse 69  Temp(Src) 98.1 F (36.7 C) (Oral)  Resp 18  SpO2 97%  Physical Exam  Nursing note and vitals reviewed. Constitutional: She is oriented to person, place, and time. She appears well-developed and well-nourished. No distress.       Uncomfortable appearing  HENT:  Head: Atraumatic.  Right Ear: External ear normal.  Left Ear: External ear normal.  Nose: Nose normal.  Mouth/Throat: Oropharynx is clear and moist. No oropharyngeal exudate.  Eyes: Conjunctivae and EOM are normal. Pupils are equal, round, and reactive to light.       No nystagmus  Neck: Normal range of motion. Neck supple.  Cardiovascular: Normal rate, regular rhythm, normal heart sounds and intact distal pulses.   Pulmonary/Chest: Effort normal and breath sounds normal. No respiratory distress. She has no wheezes. She has no rales.  Abdominal: Soft. She exhibits no distension. There is no tenderness. There is no rebound and no guarding.  Musculoskeletal: Normal range of motion.  Neurological: She is alert and oriented to person, place, and time. No cranial nerve deficit. She exhibits normal muscle tone. Coordination normal.       Normal strength Normal finger-nose and heel to shin No pronator drift or facial droop  Skin: Skin is warm and dry. No rash noted.  Psychiatric: She has a normal mood and affect.    ED Course  Procedures (including critical care time)  Labs Reviewed - No data to display  CT Head Wo Contrast (Final result)   Result time:07/17/11 0501    Final result by Rad Results In Interface (07/17/11 05:01:27)    Narrative:   *RADIOLOGY REPORT*  Clinical Data: Vertigo.  CT HEAD WITHOUT  CONTRAST  Technique: Contiguous axial images were obtained from the base of the skull through the vertex without contrast.  Comparison: CT of the head performed 06/19/2011  Findings: There is no evidence of acute infarction, mass lesion, or intra- or extra-axial hemorrhage on CT.  A small chronic  lacunar infarct is again noted within the medial right frontal lobe. Mild subcortical white matter change likely reflects small vessel ischemic microangiopathy.  The posterior fossa, including the cerebellum, brainstem and fourth ventricle, is within normal limits. The third and lateral ventricles, and basal ganglia are unremarkable in appearance. No mass effect or midline shift is seen.  There is no evidence of fracture; mild hyperostosis frontalis interna is noted. The visualized portions of the orbits are within normal limits. The paranasal sinuses and mastoid air cells are well-aerated. No significant soft tissue abnormalities are seen.  IMPRESSION:  1. No acute intracranial pathology seen on CT. 2. 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 3. Mild small vessel ischemic microangiopathy.  Original Report Authenticated By: Santa Lighter, M.D.     1. Vertigo      MDM 70 year old female with persistent vertigo. She has recently had an extensive workup for this vertigo and no cause was elicited. Will obtain CT head as patient is on aspirin and Plavix to allow spontaneous intracranial hemorrhage although I do suspect that this is highly unlikely. We'll give a small bolus of IV fluids, meclizine, and a small dose of Valium. Reassess. If CT scan is negative, would like to symptomatically improve patient and have her follow up with her primary care doctor.  Hendricks Milo, MD  5:57 AM CT head reviewed and unremarkable for acute hemorrhage  5:57 AM patient resting comfortably. States her symptoms have improved with the above medications.  Advised patient to continue taking the scopolamine patch and a meclizine at home. She will call her primary care doctor for followup appointment. She is comfortable with the plan for outpatient followup     Blair Heys, MD 07/17/11 [phone]

## 2011-07-17 NOTE — ED Notes (Signed)
Pt reports onset of vertigo last pm around 11. Pt has significant hx of vertigo and was just discharged from rehabilitation from vertigo.

## 2011-07-26 LAB — URINALYSIS, DIPSTICK ONLY
Ketones, ur: NEGATIVE
Leukocytes, UA: NEGATIVE
Nitrite: NEGATIVE
Protein, ur: 100 — AB
Urobilinogen, UA: 0.2

## 2011-07-26 LAB — DIGOXIN LEVEL: Digoxin Level: <0.2 — ABNORMAL LOW

## 2011-07-26 LAB — POCT I-STAT, CHEM 8
BUN: 18
Hemoglobin: 15.6 — ABNORMAL HIGH
Sodium: 141
TCO2: 26

## 2011-07-26 LAB — POCT CARDIAC MARKERS
CKMB, poc: 1
CKMB, poc: 1.2
Myoglobin, poc: 67.7
Myoglobin, poc: 70.4

## 2011-07-29 ENCOUNTER — Encounter (HOSPITAL_BASED_OUTPATIENT_CLINIC_OR_DEPARTMENT_OTHER): Payer: Self-pay | Admitting: *Deleted

## 2011-07-29 ENCOUNTER — Emergency Department (HOSPITAL_BASED_OUTPATIENT_CLINIC_OR_DEPARTMENT_OTHER)
Admission: EM | Admit: 2011-07-29 | Discharge: 2011-07-29 | Disposition: A | Discharge status: Home / Self Care | Payer: Medicare Other | Attending: Emergency Medicine | Admitting: Emergency Medicine

## 2011-07-29 DIAGNOSIS — Z79899 Other long term (current) drug therapy: Secondary | ICD-10-CM | POA: Insufficient documentation

## 2011-07-29 DIAGNOSIS — I10 Essential (primary) hypertension: Secondary | ICD-10-CM | POA: Insufficient documentation

## 2011-07-29 DIAGNOSIS — I509 Heart failure, unspecified: Secondary | ICD-10-CM | POA: Insufficient documentation

## 2011-07-29 DIAGNOSIS — I2510 Atherosclerotic heart disease of native coronary artery without angina pectoris: Secondary | ICD-10-CM | POA: Insufficient documentation

## 2011-07-29 DIAGNOSIS — I252 Old myocardial infarction: Secondary | ICD-10-CM | POA: Insufficient documentation

## 2011-07-29 DIAGNOSIS — E785 Hyperlipidemia, unspecified: Secondary | ICD-10-CM | POA: Insufficient documentation

## 2011-07-29 LAB — BASIC METABOLIC PANEL
BUN: 19 mg/dL (ref 6–23)
Chloride: 107 mEq/L (ref 96–112)
Creatinine, Ser: 1.2 mg/dL — ABNORMAL HIGH (ref 0.50–1.10)
GFR calc Af Amer: 53 mL/min — ABNORMAL LOW (ref >90)

## 2011-07-29 NOTE — ED Provider Notes (Signed)
History     CSN: [id number] Arrival date & time: 07/29/2011  3:12 PM  Chief Complaint  Patient presents with  . Hypertension    (Consider location/radiation/quality/duration/timing/severity/associated sxs/prior treatment) HPI Comments: The patient presents for evaluation of high blood pressure. She reports that her visiting home nurse was out and checked her blood pressure around 3 PM noting it to be 198/99 mmHg. The patient has recently been diagnosed with a small stroke as well as vertigo that has been ongoing and intermittent headaches. These symptoms have not changed at present the patient reports she has a mild headache typical of her recent headaches, right-sided, without any focal numbness or weakness or change in vision. She denies vertigo at present and denies chest pain or shortness of breath. On evaluation in the emergency department her blood pressure is 148/88 mmHg. Her physical examination is nonfocal. She reports that her home nurse had called her physician about the high blood pressure and was directed to send her to the emergency department for evaluation of this. The patient has no other complaints currently. She was recently admitted to a nursing facility after admission for vertigo. She is home now. Of note, the patient has had at least 3 CT scans of her head in the last 2 months. In the absence of a focal neuro deficit, and in the absence of any severe or abrupt onset headache, I do not see a need to repeat a CT head at this time.  Patient is a 70 y.o. female presenting with hypertension. The history is provided by the patient and medical records.  Hypertension This is a recurrent problem. The current episode started less than 1 hour ago. The problem occurs constantly. The problem has been gradually improving. Associated symptoms include headaches. Pertinent negatives include no chest pain, no abdominal pain and no shortness of breath. The symptoms are aggravated by nothing. The  symptoms are relieved by medications.    Past Medical History  Diagnosis Date  . Coronary artery disease   . Hypertension   . Hyperlipemia   . Arthritis   . CHF (congestive heart failure)   . Vertigo   . TIA (transient ischemic attack)   . Pacemaker   . Hypercholesterolemia   . MI (myocardial infarction)   . Morbid obesity     Past Surgical History  Procedure Date  . Pacemaker insertion   . Pacemaker insertion     History reviewed. No pertinent family history.  History  Substance Use Topics  . Smoking status: Never Smoker   . Smokeless tobacco: Not on file  . Alcohol Use: No    OB History    Grav Para Term Preterm Abortions TAB SAB Ect Mult Living                  Review of Systems  Constitutional: Negative for fever, diaphoresis, activity change, appetite change and fatigue.  HENT: Negative for hearing loss, ear pain, drooling, trouble swallowing, neck pain, tinnitus and ear discharge.   Eyes: Negative for photophobia, pain, redness and visual disturbance.  Respiratory: Negative for cough, chest tightness, shortness of breath and wheezing.   Cardiovascular: Negative for chest pain, palpitations and leg swelling.  Gastrointestinal: Negative for nausea, vomiting and abdominal pain.  Musculoskeletal: Negative.   Skin: Negative.  Negative for color change.  Neurological: Positive for dizziness and headaches. Negative for seizures, syncope, facial asymmetry, speech difficulty, weakness and numbness.  Hematological: Bruises/bleeds easily.  Psychiatric/Behavioral: Negative.  Negative for behavioral problems and confusion.  The patient is not nervous/anxious.     Allergies  Ampicillin  Home Medications   Current Outpatient Rx  Name Route Sig Dispense Refill  . VITAMIN D 2000 UNITS PO TABS Oral Take 2,000 Units by mouth daily.      [name] Kitchen FLUTICASONE PROPIONATE 50 MCG/ACT NA SUSP Nasal Place 2 sprays into the nose daily.     [name] Kitchen GINKGO 60 MG PO TABS Oral Take 2 tablets  by mouth daily.      [name] Kitchen HYDROCODONE-ACETAMINOPHEN 5-325 MG PO TABS Oral Take 1 tablet by mouth every 6 (six) hours as needed. For pain     . MECLIZINE HCL 25 MG PO TABS Oral Take 12.5-25 mg by mouth 3 (three) times daily as needed. For dizziness    . METOPROLOL SUCCINATE 100 MG PO TB24 Oral Take 50 mg by mouth every evening.      [name] Kitchen POTASSIUM CHLORIDE CRYS CR 20 MEQ PO TBCR Oral Take 20 mEq by mouth daily.      . TRAMADOL HCL 50 MG PO TABS Oral Take 50 mg by mouth 2 (two) times daily as needed.     . ASPIRIN 81 MG PO TABS Oral Take 81 mg by mouth daily.      [name] Kitchen CLOPIDOGREL BISULFATE 75 MG PO TABS Oral Take 75 mg by mouth daily.     [name] Kitchen DILTIAZEM HCL COATED BEADS 360 MG PO CP24 Oral Take 360 mg by mouth daily.      . FUROSEMIDE 40 MG PO TABS Oral Take 40 mg by mouth every morning.     [name] Kitchen HYDRALAZINE HCL 25 MG PO TABS Oral Take 25 mg by mouth every 8 (eight) hours.     . ISOSORBIDE MONONITRATE CR 60 MG PO TB24 Oral Take 60 mg by mouth daily.      [name] Kitchen METOCLOPRAMIDE HCL 10 MG PO TABS Oral Take 10 mg by mouth every 6 (six) hours as needed. For stomach    . METOPROLOL SUCCINATE 100 MG PO TB24 Oral Take 100 mg by mouth every morning.     [name] Kitchen OLMESARTAN MEDOXOMIL 40 MG PO TABS Oral Take 40 mg by mouth daily.      [name] Kitchen ONDANSETRON HCL 8 MG PO TABS Oral Take by mouth every 8 (eight) hours as needed.      [name] Kitchen PANTOPRAZOLE SODIUM 40 MG PO TBEC Oral Take 40 mg by mouth daily.      [name] Kitchen POTASSIUM CHLORIDE 10 MEQ PO CPCR Oral Take 10 mEq by mouth 2 (two) times daily.      [name] Kitchen ROSUVASTATIN CALCIUM 10 MG PO TABS Oral Take 10 mg by mouth daily.      . SCOPOLAMINE BASE 1.5 MG TD PT72 Transdermal Place 1 patch onto the skin every 3 (three) days.        BP 148/88  Pulse 70  Resp 20  SpO2 100%  Physical Exam  Constitutional: She is oriented to person, place, and time. She appears well-developed and well-nourished. No distress.  HENT:  Head: Normocephalic and atraumatic.  Right Ear: External ear normal.  Left Ear: External  ear normal.  Nose: Nose normal.  Mouth/Throat: Oropharynx is clear and moist. No oropharyngeal exudate.  Eyes: Conjunctivae and EOM are normal. Pupils are equal, round, and reactive to light. Right eye exhibits no nystagmus. Left eye exhibits no nystagmus.  Fundoscopic exam:      The right eye shows no AV nicking, no hemorrhage and no papilledema.       The left eye  shows no AV nicking, no hemorrhage and no papilledema.  Neck: Normal range of motion, full passive range of motion without pain and phonation normal. Neck supple. No JVD present. Carotid bruit is not present.  Cardiovascular: Normal rate, regular rhythm, normal heart sounds and intact distal pulses.  Exam reveals no gallop and no friction rub.   No murmur heard. Pulmonary/Chest: Effort normal and breath sounds normal. No respiratory distress. She has no wheezes. She has no rales.  Abdominal: Soft. Bowel sounds are normal. She exhibits no distension. There is no tenderness. There is no rebound and no guarding.  Musculoskeletal: Normal range of motion. She exhibits no edema and no tenderness.  Neurological: She is alert and oriented to person, place, and time. She has normal strength and normal reflexes. She displays no tremor. No cranial nerve deficit or sensory deficit. She exhibits normal muscle tone. Coordination normal. GCS eye subscore is 4. GCS verbal subscore is 5. GCS motor subscore is 6.  Skin: Skin is warm and dry. No rash noted. She is not diaphoretic.  Psychiatric: She has a normal mood and affect. Her behavior is normal. Judgment and thought content normal.    ED Course  Procedures (including critical care time)   Labs Reviewed  BASIC METABOLIC PANEL   No results found.   No diagnosis found.    MDM  The patient appears to have fairly significant hypertension and is managed on 5 different antihypertensive medications. At this time in the emergency department she is not hypertensive to a degree that is acutely  dangerous and I would consider her stable at this time. She appears in no apparent distress and only has a mild headache which she describes as being typical for the headaches she has been having and for which she has been worked up multiple times recently in the last 2 months including at least 3 head CT scans. Her lungs sound clear and her heart sounds are normal with regular rate and rhythm. There is no lower extremity edema, and her neurologic exam is nonfocal. At this time I see no reason to expose the patient to further radiation from a repeat head CT. I've explained this to the patient she states her understanding of and agreement with this plan of care. I will obtain a basic set of electrolytes to check her electrolyte balance and her kidney function. Otherwise no other treatment is indicated at this time. My impression is that of essential hypertension, with ongoing chronic vertigo and chronic recurrent headache that at this time is mild and tolerable.        Charlena Cross, MD 07/29/11 [phone]

## 2011-07-29 NOTE — ED Notes (Signed)
Pt states her home health nurse came out today checked her blood pressure found it to be high called her pmd who advised her to come to ED and be evaluated for elevated blood pressure and "strange sensations" mainly in right side of her head as well as some light headedness and dizziness denies chest pain denies nausea or vomtiing

## 2011-07-29 NOTE — ED Notes (Signed)
Pt resting quietly , waiting for lab results, blanket given to pt

## 2011-08-02 NOTE — H&P (Signed)
[name], [name]         ACCOUNT NO.:  [id number]  MEDICAL RECORD NO.:  [id number]  LOCATION:                                 FACILITY:  PHYSICIAN:  Eleonore Chiquito, MD         DATE OF BIRTH:  Jan 26, 1941  DATE OF ADMISSION:  06/12/2011 DATE OF DISCHARGE:                             HISTORY & PHYSICAL   PRIMARY CARE DOCTOR:  Royetta Crochet. Karlton Lemon, MD.  CHIEF COMPLAINT:  Dizziness.  HISTORY OF PRESENT ILLNESS:  This is a 70 year old female, who came to the hospital after the patient had episode of dizziness, vertiginous, and vertigo.  The patient says that she was about to go bed when she started feeling very dizzy after she also had episodes of vomiting.  The patient says she vomited 5 times.  Also, had loose bowel movement when she came to the hospital.  At this time, the patient still has dizziness and she says the dizziness is worse with movement of the head.  The patient has been taking meclizine at home before and she says that this is similar to previous dizziness she had.  The patient at this time is very drowsy and though she is able to provide the history, but she is falling asleep while talking during the interview.  The patient denies any chest pain.  Denies any shortness of breath.  No abdominal pain. The patient says that she did pass out.  PAST MEDICAL HISTORY:  Significant for; 1. Congestive heart failure. 2. Hypertension. 3. Hypercholesterolemia. 4. Myocardial infarction. 5. Status post pacemaker. 6. Transient ischemic attack. 7. Vertigo.  PAST SURGICAL HISTORY:  None.  CURRENT MEDICATIONS:  The patient takes, 1. Benicar. 2. Clopidogrel. 3. Crestor. 4. Isosorbide. 5. Lasix. 6. Meclizine. 7. Metoclopramide. 8. Metoprolol. 9. Ondansetron. 10.Tramadol-acetaminophen oral (doses of these medications have to be     confirmed).  ALLERGIES:  The patient has allergy to AMPICILLIN.  SOCIAL HISTORY:  The patient denies any tobacco abuse.  No history  of illicit drug abuse.  No history of alcohol abuse.  REVIEW OF SYSTEMS:  HEENT:  There is no headache.  Positive dizziness. NECK:  No history of thyroid problems.  CHEST:  No history of COPD, emphysema.  HEART:  Positive history of MI in the past.  The patient had myocardial Myoview test done in May 2012, which showed fixed defect with left ventricular enlargement involving the apex and the inferior wall, EF of 31%.  GI:  As in HPI.  GENITOURINARY:  No dysuria, urgency, or frequency of urination.  NEUROLOGICAL:  Positive history of TIA in the past.  No history of seizures.  PHYSICAL EXAMINATION:  VITAL SIGNS:  The patient's blood pressure is 206/100, pulse 17, respirations 18, temperature 97.1. HEENT:  Head is atraumatic, normocephalic.  Eyes, extraocular muscles are intact.  Oral mucosa is pink and moist. NECK:  Supple. CHEST:  Clear to auscultation bilaterally. HEART:  S1 and S2.  Regular in rate and rhythm. ABDOMEN:  Soft, nontender.  No organomegaly. EXTREMITIES:  No cyanosis, no clubbing, no edema. NEUROLOGIC:  The patient is very somnolent at this time, though able to move all the 4 extremities and follow the commands.  Rest of  the neuro exam could not be tested because the patient is not cooperative at this time.  IMAGING STUDIES DONE IN THE HOSPITAL: 1. Chest x-ray on August 18 showed lungs have hypoexpanded with mild     left basilar atelectasis, mild vascular congestion, and mild     cardiomegaly. 2. CT head showed no acute abnormality, stable chronic small-vessel     disease, and old right frontal deep periventricular white matter     infarct.  PERTINENT LABS:  WBC 8.7, hemoglobin 12.7, hematocrit is 38.9, platelet count of 187.  Sodium 140, potassium 3.5, chloride 105, CO2 of 23, BUN 17, creatinine 1.22.  ASSESSMENT: 1. Dizziness. 2. Questionable syncope. 3. Acute renal failure. 4. History of congestive heart failure. 5. Hypertensive urgency. 6. Nausea and  vomiting.  PLAN: 1. Dizziness.  The patient dizziness is seems to multifactorial at     this time as the patient has hypertensive urgency.  At this time,     the patient also has received Ativan this morning in the emergency     department, so she is very drowsy.  The patient has a history of     dizziness in the past and she had a workup of syncope done     previously in May 2012, which was unrevealing.  The CT of the head     is negative at this time, but I am still going to obtain an MRI of     the brain to rule out any underlying neurological abnormality     contributing to the dizziness.  Otherwise, the patient's symptoms     appear more because of the vestibular cause of the dizziness.  The     patient's dizziness becomes worse on turning the head.  Anyway, the     patient will be continued on meclizine 25 mg p.o. q.8 h. 2. Questionable syncope.  As above, it is not clear whether the     patient really did have syncope last night as the patient was very     dizzy, and she had a negative workup of syncope in the past and     recently in May 2012.  At that time, also the patient had     uncontrolled hypertension.  Treating the symptoms of dizziness and     syncope.  We will check the patient's orthostatics and also monitor     the patient under telemetry. 3. History of TIA.  The patient will be continued on Plavix. 4. Hypertension.  The patient was taking metoprolol at home, as per     the previous discharge summary, this was 100 in the morning and 50     at bedtime.  I am going to increase the metoprolol 200 p.o. b.i.d.     and also start her on hydralazine 25 mg p.o. q.8 h.  The patient's     Lasix will be held at this time because of the acute renal failure.     Also, the patient will be started on the Benicar, which she was     taking at home. 5. Nausea and vomiting.  Again, nausea and vomiting could be just due     to the dizziness, but to rule out underlying gallbladder  disease,     we will get abdominal ultrasound, and keep the patient n.p.o. at     this time. 6. DVT prophylaxis with Lovenox.     Eleonore Chiquito, MD     GL/MEDQ  D:  06/12/2011  T:  06/12/2011  Job:  [id number]  Electronically Signed by Eleonore Chiquito  on 08/02/2011 04:07:35 PM

## 2011-08-06 LAB — DIFFERENTIAL
Lymphocytes Relative: 28
Lymphs Abs: 1.3
Neutro Abs: 2.8
Neutrophils Relative %: 62

## 2011-08-06 LAB — COMPREHENSIVE METABOLIC PANEL
AST: 23
CO2: 27
Calcium: 9.3
Creatinine, Ser: 0.98
GFR calc Af Amer: >60
GFR calc non Af Amer: 57 — ABNORMAL LOW
Glucose, Bld: 149 — ABNORMAL HIGH

## 2011-08-06 LAB — LIPASE, BLOOD: Lipase: 15

## 2011-08-06 LAB — CBC
Hemoglobin: 14.5
MCHC: 33.8
RDW: 15.1 — ABNORMAL HIGH

## 2011-08-06 LAB — APTT: aPTT: 26

## 2011-08-06 LAB — PROTIME-INR
INR: 0.9
Prothrombin Time: 12.4

## 2011-08-06 LAB — DIGOXIN LEVEL: Digoxin Level: <0.2 — ABNORMAL LOW

## 2011-09-09 ENCOUNTER — Inpatient Hospital Stay (HOSPITAL_COMMUNITY)
Admission: EM | Admit: 2011-09-09 | Discharge: 2011-09-11 | DRG: 291 | Disposition: A | Discharge status: Home / Self Care | Payer: Medicare Other | Attending: Internal Medicine | Admitting: Internal Medicine

## 2011-09-09 ENCOUNTER — Encounter (HOSPITAL_COMMUNITY): Payer: Self-pay | Admitting: *Deleted

## 2011-09-09 ENCOUNTER — Emergency Department (HOSPITAL_COMMUNITY): Payer: Medicare Other

## 2011-09-09 ENCOUNTER — Other Ambulatory Visit: Payer: Self-pay

## 2011-09-09 DIAGNOSIS — M1990 Unspecified osteoarthritis, unspecified site: Secondary | ICD-10-CM | POA: Insufficient documentation

## 2011-09-09 DIAGNOSIS — I219 Acute myocardial infarction, unspecified: Secondary | ICD-10-CM | POA: Insufficient documentation

## 2011-09-09 DIAGNOSIS — I10 Essential (primary) hypertension: Secondary | ICD-10-CM | POA: Diagnosis present

## 2011-09-09 DIAGNOSIS — K219 Gastro-esophageal reflux disease without esophagitis: Secondary | ICD-10-CM | POA: Diagnosis present

## 2011-09-09 DIAGNOSIS — Z79899 Other long term (current) drug therapy: Secondary | ICD-10-CM

## 2011-09-09 DIAGNOSIS — M129 Arthropathy, unspecified: Secondary | ICD-10-CM | POA: Diagnosis present

## 2011-09-09 DIAGNOSIS — R42 Dizziness and giddiness: Secondary | ICD-10-CM | POA: Insufficient documentation

## 2011-09-09 DIAGNOSIS — Z7982 Long term (current) use of aspirin: Secondary | ICD-10-CM

## 2011-09-09 DIAGNOSIS — I509 Heart failure, unspecified: Secondary | ICD-10-CM | POA: Diagnosis present

## 2011-09-09 DIAGNOSIS — I2510 Atherosclerotic heart disease of native coronary artery without angina pectoris: Secondary | ICD-10-CM | POA: Diagnosis present

## 2011-09-09 DIAGNOSIS — I5033 Acute on chronic diastolic (congestive) heart failure: Principal | ICD-10-CM | POA: Diagnosis present

## 2011-09-09 DIAGNOSIS — I059 Rheumatic mitral valve disease, unspecified: Secondary | ICD-10-CM | POA: Diagnosis present

## 2011-09-09 DIAGNOSIS — E785 Hyperlipidemia, unspecified: Secondary | ICD-10-CM | POA: Diagnosis present

## 2011-09-09 DIAGNOSIS — R1013 Epigastric pain: Secondary | ICD-10-CM

## 2011-09-09 DIAGNOSIS — Z7902 Long term (current) use of antithrombotics/antiplatelets: Secondary | ICD-10-CM

## 2011-09-09 DIAGNOSIS — Z8673 Personal history of transient ischemic attack (TIA), and cerebral infarction without residual deficits: Secondary | ICD-10-CM

## 2011-09-09 DIAGNOSIS — I252 Old myocardial infarction: Secondary | ICD-10-CM

## 2011-09-09 DIAGNOSIS — I6509 Occlusion and stenosis of unspecified vertebral artery: Secondary | ICD-10-CM | POA: Diagnosis present

## 2011-09-09 DIAGNOSIS — J189 Pneumonia, unspecified organism: Secondary | ICD-10-CM | POA: Diagnosis present

## 2011-09-09 DIAGNOSIS — Z950 Presence of cardiac pacemaker: Secondary | ICD-10-CM | POA: Diagnosis present

## 2011-09-09 DIAGNOSIS — E7800 Pure hypercholesterolemia, unspecified: Secondary | ICD-10-CM

## 2011-09-09 DIAGNOSIS — G459 Transient cerebral ischemic attack, unspecified: Secondary | ICD-10-CM

## 2011-09-09 HISTORY — DX: Gastro-esophageal reflux disease without esophagitis: K21.9

## 2011-09-09 HISTORY — DX: Cerebral infarction, unspecified: I63.9

## 2011-09-09 HISTORY — DX: Sleep apnea, unspecified: G47.30

## 2011-09-09 HISTORY — DX: Cardiac arrhythmia, unspecified: I49.9

## 2011-09-09 HISTORY — DX: Pneumonia, unspecified organism: J18.9

## 2011-09-09 HISTORY — DX: Peripheral vascular disease, unspecified: I73.9

## 2011-09-09 LAB — LIPID PANEL
Cholesterol: 132 mg/dL (ref 0–200)
HDL: 47 mg/dL (ref >39)
Total CHOL/HDL Ratio: 2.8 RATIO
Triglycerides: 80 mg/dL (ref <150)

## 2011-09-09 LAB — CBC
HCT: 39.7 % (ref 36.0–46.0)
Hemoglobin: 13.1 g/dL (ref 12.0–15.0)
MCH: 31.1 pg (ref 26.0–34.0)
MCHC: 32.7 g/dL (ref 30.0–36.0)
MCV: 95 fL (ref 78.0–100.0)
RDW: 15 % (ref 11.5–15.5)
RDW: 15 % (ref 11.5–15.5)
WBC: 6.1 10*3/uL (ref 4.0–10.5)

## 2011-09-09 LAB — BASIC METABOLIC PANEL
BUN: 15 mg/dL (ref 6–23)
BUN: 14 mg/dL (ref 6–23)
CO2: 28 mEq/L (ref 19–32)
Chloride: 107 mEq/L (ref 96–112)
Creatinine, Ser: 1.06 mg/dL (ref 0.50–1.10)
Creatinine, Ser: 0.94 mg/dL (ref 0.50–1.10)
GFR calc Af Amer: 62 mL/min — ABNORMAL LOW (ref >90)
GFR calc Af Amer: 71 mL/min — ABNORMAL LOW (ref >90)
GFR calc non Af Amer: 53 mL/min — ABNORMAL LOW (ref >90)
Glucose, Bld: 122 mg/dL — ABNORMAL HIGH (ref 70–99)

## 2011-09-09 LAB — HEMOGLOBIN A1C: Mean Plasma Glucose: 126 mg/dL — ABNORMAL HIGH (ref <117)

## 2011-09-09 LAB — HEPATIC FUNCTION PANEL
Alkaline Phosphatase: 94 U/L (ref 39–117)
Bilirubin, Direct: <0.1 mg/dL (ref 0.0–0.3)
Total Protein: 7.2 g/dL (ref 6.0–8.3)

## 2011-09-09 LAB — CARDIAC PANEL(CRET KIN+CKTOT+MB+TROPI)
CK, MB: 2 ng/mL (ref 0.3–4.0)
CK, MB: 2 ng/mL (ref 0.3–4.0)
Relative Index: INVALID (ref 0.0–2.5)
Total CK: 60 U/L (ref 7–177)

## 2011-09-09 LAB — LIPASE, BLOOD: Lipase: 20 U/L (ref 11–59)

## 2011-09-09 MED ORDER — CLOPIDOGREL BISULFATE 75 MG PO TABS
75.0000 mg | ORAL_TABLET | Freq: Every day | ORAL | Status: DC
  Administered 2011-09-09 – 2011-09-11 (×3): 75 mg via ORAL
  Filled 2011-09-09 (×3): qty 1

## 2011-09-09 MED ORDER — POTASSIUM CHLORIDE CRYS ER 20 MEQ PO TBCR
20.0000 meq | EXTENDED_RELEASE_TABLET | Freq: Every day | ORAL | Status: DC
  Administered 2011-09-09 – 2011-09-11 (×3): 20 meq via ORAL
  Filled 2011-09-09 (×3): qty 1

## 2011-09-09 MED ORDER — ACETAMINOPHEN 325 MG PO TABS: 650.0000 mg | ORAL_TABLET | ORAL | Status: DC | PRN

## 2011-09-09 MED ORDER — HEPARIN SODIUM (PORCINE) 5000 UNIT/ML IJ SOLN
5000.0000 [IU] | Freq: Three times a day (TID) | INTRAMUSCULAR | Status: DC
  Administered 2011-09-09 – 2011-09-11 (×7): 5000 [IU] via SUBCUTANEOUS
  Filled 2011-09-09 (×9): qty 1

## 2011-09-09 MED ORDER — METOPROLOL SUCCINATE ER 50 MG PO TB24: 50.0000 mg | ORAL_TABLET | Freq: Every evening | ORAL | Status: DC

## 2011-09-09 MED ORDER — OLMESARTAN MEDOXOMIL 40 MG PO TABS
40.0000 mg | ORAL_TABLET | Freq: Every day | ORAL | Status: DC
  Administered 2011-09-09 – 2011-09-11 (×3): 40 mg via ORAL
  Filled 2011-09-09 (×3): qty 1

## 2011-09-09 MED ORDER — MAGNESIUM HYDROXIDE NICU ORAL SYRINGE 400 MG/5 ML
30.0000 mL | Freq: Every day | ORAL | Status: DC | PRN
  Filled 2011-09-09: qty 30

## 2011-09-09 MED ORDER — FUROSEMIDE 10 MG/ML IJ SOLN
40.0000 mg | Freq: Four times a day (QID) | INTRAMUSCULAR | Status: AC
  Administered 2011-09-09 (×3): 40 mg via INTRAVENOUS
  Filled 2011-09-09 (×2): qty 4

## 2011-09-09 MED ORDER — NITROGLYCERIN 0.4 MG SL SUBL
0.4000 mg | SUBLINGUAL_TABLET | SUBLINGUAL | Status: DC | PRN
  Administered 2011-09-09 (×2): 0.4 mg via SUBLINGUAL

## 2011-09-09 MED ORDER — METOPROLOL SUCCINATE ER 50 MG PO TB24
50.0000 mg | ORAL_TABLET | Freq: Two times a day (BID) | ORAL | Status: DC
  Administered 2011-09-09 – 2011-09-11 (×5): 50 mg via ORAL
  Filled 2011-09-09 (×6): qty 1

## 2011-09-09 MED ORDER — MOXIFLOXACIN HCL 400 MG PO TABS
400.0000 mg | ORAL_TABLET | Freq: Every day | ORAL | Status: DC
  Administered 2011-09-09 – 2011-09-10 (×3): 400 mg via ORAL
  Filled 2011-09-09 (×3): qty 1

## 2011-09-09 MED ORDER — SODIUM CHLORIDE 0.9 % IJ SOLN: 3.0000 mL | INTRAMUSCULAR | Status: DC | PRN

## 2011-09-09 MED ORDER — ROSUVASTATIN CALCIUM 10 MG PO TABS
10.0000 mg | ORAL_TABLET | Freq: Every day | ORAL | Status: DC
  Administered 2011-09-09 – 2011-09-11 (×3): 10 mg via ORAL
  Filled 2011-09-09 (×3): qty 1

## 2011-09-09 MED ORDER — ONDANSETRON HCL 4 MG/2ML IJ SOLN: 4.0000 mg | Freq: Four times a day (QID) | INTRAMUSCULAR | Status: DC | PRN

## 2011-09-09 MED ORDER — SODIUM CHLORIDE 0.9 % IJ SOLN
3.0000 mL | Freq: Two times a day (BID) | INTRAMUSCULAR | Status: DC
  Administered 2011-09-09 – 2011-09-10 (×3): 3 mL via INTRAVENOUS

## 2011-09-09 MED ORDER — GI COCKTAIL ~~LOC~~
30.0000 mL | Freq: Two times a day (BID) | ORAL | Status: DC | PRN
  Filled 2011-09-09: qty 30

## 2011-09-09 MED ORDER — ASPIRIN 81 MG PO CHEW
81.0000 mg | CHEWABLE_TABLET | Freq: Every day | ORAL | Status: DC
  Administered 2011-09-09 – 2011-09-11 (×3): 81 mg via ORAL
  Filled 2011-09-09 (×3): qty 1

## 2011-09-09 MED ORDER — SODIUM CHLORIDE 0.9 % IV SOLN: 250.0000 mL | INTRAVENOUS | Status: DC

## 2011-09-09 MED ORDER — METOPROLOL SUCCINATE ER 100 MG PO TB24: 100.0000 mg | ORAL_TABLET | Freq: Two times a day (BID) | ORAL | Status: DC

## 2011-09-09 MED ORDER — MAGNESIUM HYDROXIDE 400 MG/5ML PO SUSP
30.0000 mL | Freq: Every day | ORAL | Status: DC | PRN
  Administered 2011-09-09: 30 mL via ORAL
  Filled 2011-09-09: qty 30

## 2011-09-09 MED ORDER — ISOSORBIDE MONONITRATE ER 60 MG PO TB24
60.0000 mg | ORAL_TABLET | Freq: Every day | ORAL | Status: DC
  Administered 2011-09-09 – 2011-09-11 (×3): 60 mg via ORAL
  Filled 2011-09-09 (×3): qty 1

## 2011-09-09 MED ORDER — PANTOPRAZOLE SODIUM 40 MG IV SOLR
40.0000 mg | Freq: Once | INTRAVENOUS | Status: AC
  Administered 2011-09-09: 40 mg via INTRAVENOUS
  Filled 2011-09-09: qty 40

## 2011-09-09 MED ORDER — FUROSEMIDE 10 MG/ML IJ SOLN
INTRAMUSCULAR | Status: AC
  Administered 2011-09-09: 40 mg via INTRAVENOUS
  Filled 2011-09-09: qty 4

## 2011-09-09 MED ORDER — PANTOPRAZOLE SODIUM 40 MG PO TBEC
40.0000 mg | DELAYED_RELEASE_TABLET | Freq: Every day | ORAL | Status: DC
  Administered 2011-09-10 – 2011-09-11 (×2): 40 mg via ORAL
  Filled 2011-09-09 (×2): qty 1

## 2011-09-09 MED ORDER — PANTOPRAZOLE SODIUM 40 MG IV SOLR: 40.0000 mg | Freq: Two times a day (BID) | INTRAVENOUS | Status: DC

## 2011-09-09 MED ORDER — HYDRALAZINE HCL 25 MG PO TABS
25.0000 mg | ORAL_TABLET | Freq: Three times a day (TID) | ORAL | Status: DC
  Administered 2011-09-09 – 2011-09-11 (×7): 25 mg via ORAL
  Filled 2011-09-09 (×10): qty 1

## 2011-09-09 MED ORDER — ALBUTEROL SULFATE (5 MG/ML) 0.5% IN NEBU: 2.5000 mg | INHALATION_SOLUTION | RESPIRATORY_TRACT | Status: DC | PRN

## 2011-09-09 MED ORDER — DILTIAZEM HCL ER COATED BEADS 360 MG PO CP24
360.0000 mg | ORAL_CAPSULE | Freq: Every day | ORAL | Status: DC
  Administered 2011-09-09 – 2011-09-11 (×3): 360 mg via ORAL
  Filled 2011-09-09 (×3): qty 1

## 2011-09-09 NOTE — Progress Notes (Signed)
I HAVE SEEN THIS PATIENT, WILL CONTINUE FOLLOWING WITH  PROGRESSION OF CARE AND WILL ARRANGE ANY HOME HEALTH/ DISCHARGE NEEDS PRIOR TO DISCHARGE.   UTILIZATION REVIEW COMPLETE Chauncy Lean 09/09/2011 [phone] OR [phone]

## 2011-09-09 NOTE — ED Provider Notes (Signed)
History     CSN: [id number] Arrival date & time: 09/09/2011  2:16 AM   First MD Initiated Contact with Patient 09/09/11 0405      Chief Complaint  Patient presents with  . Chest Pain    (Consider location/radiation/quality/duration/timing/severity/associated sxs/prior treatment) HPI This is a 70 year old black female with history of coronary artery disease. She developed the onset of a burning epigastric pain at about 11:30 yesterday evening. This pain radiated to the substernal region and was accompanied with some tightness. It was severe at its worst. It was associated with nausea and vomiting. Vomiting improved the pain as if something was released. During her worst episodes she states she had the sensation that her heart was beating rapidly. It was also associated with shortness of breath and lightheadedness. She called EMS fearing that she would pass out at home. She was given sublingual nitroglycerin with some improvement in the pain is now moderate. She states she has chronic lower extremity edema and some right ptosis for the past 2 months following an episode of vertigo.  Past Medical History  Diagnosis Date  . Coronary artery disease   . Hypertension   . Hyperlipemia   . Arthritis   . CHF (congestive heart failure)   . Vertigo   . TIA (transient ischemic attack)   . Pacemaker   . Hypercholesterolemia   . MI (myocardial infarction)   . Morbid obesity     Past Surgical History  Procedure Date  . Pacemaker insertion   . Pacemaker insertion     History reviewed. No pertinent family history.  History  Substance Use Topics  . Smoking status: Never Smoker   . Smokeless tobacco: Not on file  . Alcohol Use: No    OB History    Grav Para Term Preterm Abortions TAB SAB Ect Mult Living                  Review of Systems  All other systems reviewed and are negative.    Allergies  Ampicillin  Home Medications   Current Outpatient Rx  Name Route Sig  Dispense Refill  . ASPIRIN 81 MG PO TABS Oral Take 81 mg by mouth daily.      [name] Kitchen VITAMIN D 2000 UNITS PO TABS Oral Take 2,000 Units by mouth daily.      [name] Kitchen CLOPIDOGREL BISULFATE 75 MG PO TABS Oral Take 75 mg by mouth daily.     [name] Kitchen DILTIAZEM HCL COATED BEADS 360 MG PO CP24 Oral Take 360 mg by mouth daily.      [name] Kitchen FLUTICASONE PROPIONATE 50 MCG/ACT NA SUSP Nasal Place 2 sprays into the nose daily.     . FUROSEMIDE 40 MG PO TABS Oral Take 40 mg by mouth daily. For fluid    . GINKGO 60 MG PO TABS Oral Take 2 tablets by mouth 2 (two) times daily.     [name] Kitchen HYDRALAZINE HCL 25 MG PO TABS Oral Take 25 mg by mouth every 8 (eight) hours.     [name] Kitchen HYDROCODONE-ACETAMINOPHEN 5-325 MG PO TABS Oral Take 1 tablet by mouth every 6 (six) hours as needed. For pain     . ISOSORBIDE MONONITRATE ER 60 MG PO TB24 Oral Take 60 mg by mouth daily.      [name] Kitchen MECLIZINE HCL 25 MG PO TABS Oral Take 12.5-25 mg by mouth 3 (three) times daily as needed. For dizziness    . METOCLOPRAMIDE HCL 10 MG PO TABS Oral Take 10 mg by mouth  every 6 (six) hours as needed. For indigestion    . METOPROLOL SUCCINATE 100 MG PO TB24 Oral Take 100 mg by mouth 2 (two) times daily.     [name] Kitchen NITROGLYCERIN 0.4 MG SL SUBL Sublingual Place 0.4 mg under the tongue every 5 (five) minutes as needed.      [name] Kitchen OLMESARTAN MEDOXOMIL 40 MG PO TABS Oral Take 40 mg by mouth daily.      [name] Kitchen PANTOPRAZOLE SODIUM 40 MG PO TBEC Oral Take 40 mg by mouth daily.      [name] Kitchen ROSUVASTATIN CALCIUM 10 MG PO TABS Oral Take 10 mg by mouth daily.      . TRAMADOL HCL 50 MG PO TABS Oral Take 50 mg by mouth 2 (two) times daily as needed. For pain    . POTASSIUM CHLORIDE CRYS CR 20 MEQ PO TBCR Oral Take 20 mEq by mouth daily.        BP 140/83  Pulse 69  Temp(Src) 98.6 F (37 C) (Oral)  Resp 16  SpO2 99%  Physical Exam General: Well-developed, well-nourished female in no acute distress; appearance consistent with age of record HENT: normocephalic, atraumatic; poor dentition Eyes: pupils equal  round and reactive to light; extraocular muscles intact; arcus senilis bilaterally; mild right ptosis Neck: supple Heart: regular rate and rhythm with frequent ectopy; cardiac monitor shows paced rhythm with occasional native beats Lungs: clear to auscultation bilaterally Abdomen: soft; epigastric tenderness; obese Extremities: No deformity;  trace edema of lower legs  Neurologic: Awake, alert and oriented; motor function intact in all extremities; no facial droop Skin: Warm and dry Psychiatric: Normal mood and affect    ED Course  Procedures (including critical care time)   MDM   EKG Interpretation:  Date & Time: 09/09/2011 2:21 AM  Rate: 78  Rhythm: Electronically paced with some native beats  QRS Axis: indeterminate  Intervals: Indeterminate  ST/T Wave abnormalities: indeterminate  Conduction Disutrbances:Indeterminate  Narrative Interpretation:   Old EKG Reviewed: changes noted: No native beats seen previously  Nursing notes and vitals signs, including pulse oximetry, reviewed.  Summary of this visit's results, reviewed by myself:  Labs:  Results for orders placed during the hospital encounter of [id number]  BASIC METABOLIC PANEL      Component Value Range   Sodium 142  135 - 145 (mEq/L)   Potassium 3.9  3.5 - 5.1 (mEq/L)   Chloride 106  96 - 112 (mEq/L)   CO2 28  19 - 32 (mEq/L)   Glucose, Bld 132 (*) 70 - 99 (mg/dL)   BUN 15  6 - 23 (mg/dL)   Creatinine, Ser 1.06  0.50 - 1.10 (mg/dL)   Calcium 9.3  8.4 - 10.5 (mg/dL)   GFR calc non Af Amer 53 (*) >90 (mL/min)   GFR calc Af Amer 62 (*) >90 (mL/min)  CBC      Component Value Range   WBC 7.0  4.0 - 10.5 (K/uL)   RBC 4.25  3.87 - 5.11 (MIL/uL)   Hemoglobin 13.2  12.0 - 15.0 (g/dL)   HCT 40.4  36.0 - 46.0 (%)   MCV 95.1  78.0 - 100.0 (fL)   MCH 31.1  26.0 - 34.0 (pg)   MCHC 32.7  30.0 - 36.0 (g/dL)   RDW 15.0  11.5 - 15.5 (%)   Platelets 203  150 - 400 (K/uL)  LIPASE, BLOOD      Component Value Range    Lipase 20  11 - 59 (U/L)  HEPATIC FUNCTION  PANEL      Component Value Range   Total Protein 7.2  6.0 - 8.3 (g/dL)   Albumin 3.4 (*) 3.5 - 5.2 (g/dL)   AST 19  0 - 37 (U/L)   ALT 14  0 - 35 (U/L)   Alkaline Phosphatase 94  39 - 117 (U/L)   Total Bilirubin 0.2 (*) 0.3 - 1.2 (mg/dL)   Bilirubin, Direct <0.1  0.0 - 0.3 (mg/dL)   Indirect Bilirubin NOT CALCULATED  0.3 - 0.9 (mg/dL)  TROPONIN I      Component Value Range   Troponin I <0.30  <0.30 (ng/mL)    Imaging Studies: Chest Portable 1 View  09/09/2011  *RADIOLOGY REPORT*  Clinical Data: Chest pain.  PORTABLE CHEST - 1 VIEW  Comparison: Chest radiograph performed 06/12/2011  Findings: The lungs are well-aerated.  Vascular congestion is noted, without significant pulmonary edema.  Mild left basilar opacity likely reflects atelectasis.  There is no evidence of pleural effusion or pneumothorax.  The cardiomediastinal silhouette is enlarged; a pacemaker is noted overlying the right chest wall, with leads ending overlying the right atrium and right ventricle.  No acute osseous abnormalities are seen.  IMPRESSION:  1.  Vascular congestion and cardiomegaly, without significant pulmonary edema. 2.  Mild left basilar opacity likely reflects atelectasis.  Original Report Authenticated By: Santa Lighter, M.D.               Wynetta Fines, MD 09/09/11 [phone]

## 2011-09-09 NOTE — Progress Notes (Signed)
  Subjective: No events overnight.   Objective:  Vital signs in last 24 hours:  Filed Vitals:   09/09/11 0226 09/09/11 0330 09/09/11 0430 09/09/11 0530  BP: 129/78 140/83 120/74 140/88  Pulse: 71 69 69 69  Temp: 98.6 F (37 C)     TempSrc: Oral     Resp: 20 16  16   SpO2: 98% 99% 99% 99%    Intake/Output from previous day:  No intake or output data in the 24 hours ending 09/09/11 0801  Physical Exam: General: Alert, awake, oriented x3, in no acute distress. HEENT: No bruits, no goiter. Heart: Regular rate and rhythm, without murmurs, rubs, gallops. Very distant heart sounds. Lungs: Clear to auscultation bilaterally but decreased sounds at bases.  Abdomen: Soft, nontender, nondistended, positive bowel sounds. Extremities: No clubbing cyanosis with positive pedal pulses. Bilateral pitting edema extending to knees. Neuro: Grossly intact, nonfocal.   Lab Results:  Basic Metabolic Panel:    Component Value Date/Time   NA 142 09/09/2011 0350   K 3.9 09/09/2011 0350   CL 106 09/09/2011 0350   CO2 28 09/09/2011 0350   BUN 15 09/09/2011 0350   CREATININE 1.06 09/09/2011 0350   GLUCOSE 132* 09/09/2011 0350   CALCIUM 9.3 09/09/2011 0350   CBC:    Component Value Date/Time   WBC 7.0 09/09/2011 0350   HGB 13.2 09/09/2011 0350   HCT 40.4 09/09/2011 0350   PLT 203 09/09/2011 0350   MCV 95.1 09/09/2011 0350   NEUTROABS 6.1 06/12/2011 0430   LYMPHSABS 2.1 06/12/2011 0430   MONOABS 0.5 06/12/2011 0430   EOSABS 0.0 06/12/2011 0430   BASOSABS 0.0 06/12/2011 0430    No results found for this or any previous visit (from the past 240 hour(s)).  Studies/Results: Chest Portable 1 View  09/09/2011   IMPRESSION:  1.  Vascular congestion and cardiomegaly, without significant pulmonary edema. 2.  Mild left basilar opacity likely reflects atelectasis.    Medications: Scheduled Meds:   . aspirin  81 mg Oral Daily  . clopidogrel  75 mg Oral Daily  . diltiazem  360 mg Oral Daily    . furosemide      . furosemide  40 mg Intravenous Q6H  . heparin  5,000 Units Subcutaneous Q8H  . hydrALAZINE  25 mg Oral Q8H  . isosorbide mononitrate  60 mg Oral Daily  . metoprolol  100 mg Oral BID  . metoprolol  50 mg Oral QPM  . olmesartan  40 mg Oral Daily  . pantoprazole (PROTONIX) IV  40 mg Intravenous Once  . pantoprazole (PROTONIX) IV  40 mg Intravenous Q12H  . potassium chloride SA  20 mEq Oral Daily  . rosuvastatin  10 mg Oral Daily  . sodium chloride  3 mL Intravenous Q12H   Continuous Infusions:   . sodium chloride     PRN Meds:.acetaminophen, gi cocktail, nitroGLYCERIN, ondansetron (ZOFRAN) IV, sodium chloride  Assessment/Plan:  Principal Problem:  *CHF (congestive heart failure) - clinically improving but still volume overloaded clinically. Will continue lasix and monitor strict I's and O's. Will also follow up on electrolyte panel in AM. Continue to cycle cardiac enzymes.  Active Problems:  Hypertension - at goal, will continue the same medication regimen   Hyperlipemia - check FLP and for now continue statin   Coronary artery disease - clinically stable for now, no events on telemetry, CE x 2 negative    LOS: 0 days   MAGICK-Contrina Orona 09/09/2011, 8:01 AM

## 2011-09-09 NOTE — ED Notes (Signed)
Admitting MD in to speak with pt at this time.

## 2011-09-09 NOTE — H&P (Signed)
PCP:  PRIMARY CARE PHYSICIAN:  Royetta Crochet. Karlton Lemon, M.D. PRIMARY CARDIOLOGIST:  Dr. Birdie Riddle.  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 She felt weak in the legs but not  dizzy and did not pass out, no diaphoresis. She called EMS and was given several rounds  of nitroglycerin and started feeling better, and continued to feel better with nitro in  the ED.   On further questioning she also states that her visiting nurse measured her weight  Monday and she was 2 lbs down, but then Tuesday was 6 lbs up, and that her legs are  more swollen. She usually sleeps with her legs somewhat elevated, but also with 3  pillows because if totally flat she gets the feeling of chest congestion. She denies  frank chest pressure, chest pain, angina. However, she also endorses frequent episodes of GERD for which she has been taking Protonix; she ate some baked chicken earlier tonight but nothing else  particularly noxious or acidic, no coffee, alcohol, and she is a never smoker.   In the ED: chemistry, LFT's, Troponin, CBC were all negative. EKG paced. CXR with  vascular congestion and c'megaly, no edema. She was given SL NTG's in the ED.  Of note, patient appears to be evaluated in the ED every month for various issues like  dizziness, hip and back pain, hypertension. She was admitted in 02/2011 for syncopal  episodes and was having nonspecific chest pain and indigestion. She had negative  enzymes x3, and Dr. Doylene Canard saw her. Stress test was negative with  results as below.  She was thought to have indigestion, so NSAID's were stopped, and she started Protonix  40 daily. She does not know what the upshot of the stress test was, but denies she had  any cardiac cath or any other cardiac testing since.   At present pt is resting comfortably. ROS as above o/w unremarkable    Past Medical History  Diagnosis Date  . Coronary artery disease     Cath 04/2006 normal coronaries, mild-mod slow flow in all coronaries with slow distall runoff, mild-mod LV enlargement, EF 55%   . Hypertension   . Hyperlipemia   . Arthritis   . CHF (congestive heart failure)     05/2011 echo severe concentric LVH, normal systolic fxn, grade 1 diastolic dysfxn, paradoxical septal motion  . Vertigo   . 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 Pacemaker     For second degree HB  . Hypercholesterolemia   . MI (myocardial infarction)   . Morbid obesity     Past Surgical History  Procedure Date  . Pacemaker insertion   . Pacemaker insertion     Medications:  HOME MEDS: Prior to Admission medications   Medication Sig Start Date End Date Taking? Authorizing Provider  aspirin 81 MG tablet Take 81 mg by mouth daily.     Yes Historical Provider, MD  Cholecalciferol (VITAMIN D) 2000 UNITS tablet Take 2,000 Units  by mouth daily.     Yes Historical Provider, MD  clopidogrel (PLAVIX) 75 MG tablet Take 75 mg by mouth daily.    Yes Historical Provider, MD  diltiazem (CARDIZEM CD) 360 MG 24 hr capsule Take 360 mg by mouth daily.     Yes Historical Provider, MD  fluticasone (FLONASE) 50 MCG/ACT nasal spray Place 2 sprays into the nose daily.    Yes Historical Provider, MD  furosemide (LASIX) 40 MG tablet Take 40 mg by mouth daily. For fluid   Yes Historical Provider, MD  Ginkgo 60 MG TABS Take 2 tablets by mouth 2 (two) times daily.    Yes Historical Provider, MD  hydrALAZINE (APRESOLINE) 25 MG tablet Take 25 mg by mouth  every 8 (eight) hours.    Yes Historical Provider, MD  HYDROcodone-acetaminophen (NORCO) 5-325 MG per tablet Take 1 tablet by mouth every 6 (six) hours as needed. For pain    Yes Historical Provider, MD  isosorbide mononitrate (IMDUR) 60 MG 24 hr tablet Take 60 mg by mouth daily.     Yes Historical Provider, MD  meclizine (ANTIVERT) 25 MG tablet Take 12.5-25 mg by mouth 3 (three) times daily as needed. For dizziness   Yes Historical Provider, MD  metoCLOPramide (REGLAN) 10 MG tablet Take 10 mg by mouth every 6 (six) hours as needed. For indigestion   Yes Historical Provider, MD  metoprolol (TOPROL-XL) 100 MG 24 hr tablet Take 100 mg by mouth 2 (two) times daily.    Yes Historical Provider, MD  nitroGLYCERIN (NITROSTAT) 0.4 MG SL tablet Place 0.4 mg under the tongue every 5 (five) minutes as needed.     Yes Historical Provider, MD  olmesartan (BENICAR) 40 MG tablet Take 40 mg by mouth daily.     Yes Historical Provider, MD  pantoprazole (PROTONIX) 40 MG tablet Take 40 mg by mouth daily.     Yes Historical Provider, MD  rosuvastatin (CRESTOR) 10 MG tablet Take 10 mg by mouth daily.     Yes Historical Provider, MD  traMADol (ULTRAM) 50 MG tablet Take 50 mg by mouth 2 (two) times daily as needed. For pain   Yes Historical Provider, MD  potassium chloride SA (K-DUR,KLOR-CON) 20 MEQ tablet Take 20 mEq by mouth daily.      Historical Provider, MD    Allergies:  Allergies  Allergen Reactions  . Ampicillin Other (See Comments)    Increases heart rhythm     Social History:  Has a daughter, and lives on her own, ambulates with a walker, has a visiting nurse and home telemonitoring   reports that she has never smoked. She does not have any smokeless tobacco history on file. She reports that she does not drink alcohol or use illicit drugs.  Family History: History reviewed. No pertinent family history.  Physical Exam: Filed Vitals:   09/09/11 0226 09/09/11 0330 09/09/11 0430 09/09/11 0530  BP:  129/78 140/83 120/74 140/88  Pulse: 71 69 69 69  Temp: 98.6 F (37 C)     TempSrc: Oral     Resp: 20 16  16   SpO2: 98% 99% 99% 99%   Blood pressure 140/88, pulse 69, temperature 98.6 F (37 C), temperature source Oral, resp. rate 16, SpO2 99.00%.   Gen: Very large F in ED stretcher, daughter at bedside. Resting comfortably, appears  well at present, no distress, able to speak in full sentences, laying in bed at around  20 degrees.  HEENT: PERRL around 5-6 mm, contract bialterally,  EOMI, sclera clear, mouth moist,  normal appearing.  Neck: Large, unable to assess JVD Lungs: CTAB no w/c/r, no crackles Heart: Very distant due to habitus, but can hear very soft S1/2, no m/g Abd: very obese but soft NT ND, benign  Extrem: warm, well perfused, bilateral radials palpable, but BLE show gross soft  pitting edema up to below the knee, with hyperpigmented changes as well Neuro: Grossly non-focal, alert, pleasant, conversant, moves extremities spontaneously,  sits up in ED bed without assistance    Labs & Imaging Results for orders placed during the hospital encounter of 09/09/11 (from the past 48 hour(s))  BASIC METABOLIC PANEL     Status: Abnormal   Collection Time   09/09/11  3:50 AM      Component Value Range Comment   Sodium 142  135 - 145 (mEq/L)    Potassium 3.9  3.5 - 5.1 (mEq/L)    Chloride 106  96 - 112 (mEq/L)    CO2 28  19 - 32 (mEq/L)    Glucose, Bld 132 (*) 70 - 99 (mg/dL)    BUN 15  6 - 23 (mg/dL)    Creatinine, Ser 1.06  0.50 - 1.10 (mg/dL)    Calcium 9.3  8.4 - 10.5 (mg/dL)    GFR calc non Af Amer 53 (*) >90 (mL/min)    GFR calc Af Amer 62 (*) >90 (mL/min)   CBC     Status: Normal   Collection Time   09/09/11  3:50 AM      Component Value Range Comment   WBC 7.0  4.0 - 10.5 (K/uL)    RBC 4.25  3.87 - 5.11 (MIL/uL)    Hemoglobin 13.2  12.0 - 15.0 (g/dL)    HCT 40.4  36.0 - 46.0 (%)    MCV 95.1  78.0 - 100.0 (fL)    MCH 31.1  26.0 - 34.0 (pg)    MCHC 32.7  30.0  - 36.0 (g/dL)    RDW 15.0  11.5 - 15.5 (%)    Platelets 203  150 - 400 (K/uL)   LIPASE, BLOOD     Status: Normal   Collection Time   09/09/11  4:25 AM      Component Value Range Comment   Lipase 20  11 - 59 (U/L)   HEPATIC FUNCTION PANEL     Status: Abnormal   Collection Time   09/09/11  4:25 AM      Component Value Range Comment   Total Protein 7.2  6.0 - 8.3 (g/dL)    Albumin 3.4 (*) 3.5 - 5.2 (g/dL)    AST 19  0 - 37 (U/L)    ALT 14  0 - 35 (U/L)    Alkaline Phosphatase 94  39 - 117 (U/L)    Total Bilirubin 0.2 (*) 0.3 - 1.2 (mg/dL)    Bilirubin, Direct <0.1  0.0 - 0.3 (mg/dL)    Indirect Bilirubin NOT CALCULATED  0.3 - 0.9 (mg/dL)   TROPONIN I     Status: Normal   Collection Time   09/09/11  4:25 AM      Component Value Range Comment   Troponin I <0.30  <0.30 (ng/mL)    Chest Portable 1 View  09/09/2011  *RADIOLOGY REPORT*  Clinical Data: Chest pain.  PORTABLE CHEST - 1 VIEW  Comparison: Chest radiograph performed 06/12/2011  Findings: The lungs are well-aerated.  Vascular congestion is noted, without significant pulmonary edema.  Mild left basilar opacity likely reflects atelectasis.  There is no evidence of pleural effusion or pneumothorax.  The cardiomediastinal silhouette is enlarged; a pacemaker is noted overlying the right chest wall, with leads ending overlying the right atrium and right ventricle.  No acute osseous abnormalities are seen.  IMPRESSION:  1.  Vascular congestion and cardiomegaly, without significant pulmonary edema. 2.  Mild left basilar opacity likely reflects atelectasis.  Original Report Authenticated By: Santa Lighter, M.D.   02/2011 nuclear study IMPRESSION: 1.  Left ventricular enlargement with a fixed defect involving the apex and inferior wall. 2.  Area of mild reversibility involving the apical segment of the inferior/inferoseptal wall.  This study was made a "call report." 3.  Global hypokinesis, with an ejection fraction estimated at 31%. Apical  marked hypokinesis to akinesis.  EKG V-pacing for many beats, but there are several beats that appear to be native with  AFib as underlying rhythm. Native beats show wide QRS 138 msec, with no specific ST  segment deviations for such a wide QRS, T segments appropriate as well.   05/2011 echo   Study Conclusions    - Left ventricle: The cavity size was normal. There was severe     concentric hypertrophy. Systolic function was normal. Wall motion     was normal; there were no regional wall motion abnormalities.     Doppler parameters are consistent with abnormal left ventricular     relaxation (grade 1 diastolic dysfunction).   - Ventricular septum: Septal motion showed paradox.   - Aortic valve: Trivial regurgitation.   - Mitral valve: Mild regurgitation.   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  1. Chest discomfort / palpitations: DDx includes mild vascular congestion (i.e. acute on chronic diastolic CHF exacerbation, I think is the most likely) vs unstable angina vs cardiac dysrhythmia vs GERD. Volume overload is mild at best, is not hypoxic, hemodynamics doing well, and she is warm.  Of note, she apparently has home telemonitoring including weights.   - ROMI, trend ECG - Echo done 05/2011, don't see utility of another - Diuresis: Lasix 40 mg IV q6 x3 doses - Give Maalox, Protonix IV  - Continue Toprol, ASA 81, Plavix, Hydralazine, Diltiazem, Imdur, ACEi, statin - Keep on telemetry  - TSH   2. Med rec: holding non-essential meds   No other acute medical issues.   Full code, discussed with  pt Telemetry, team 2  Other plans as per orders.   Mckynlie Vanderslice 09/09/2011, 6:58 AM

## 2011-09-09 NOTE — ED Notes (Signed)
Pt describes pain in left side of chest that is burning and tight.  N/v, threw up en route to ED.  Around 11 pm pt states she felt her heart was racing which made her nauseous and she vomited.  Pt states she does not feel that her heart is racing at this time.  Nitro by EMS improved pain.

## 2011-09-09 NOTE — ED Notes (Signed)
Per EMS:  Pt c/o CP since 1 pm yesterday which worsened at 11 pm this evening.  Describes pain in center of chest as burning and pressure.  N/v, no diaphoresis.  Pain does not radiate.  Nitro x 3, 324 chewable ASA.  Nitro took pain from 8-7/10.

## 2011-09-10 LAB — CBC
HCT: 39.7 % (ref 36.0–46.0)
MCH: 30.3 pg (ref 26.0–34.0)
MCHC: 32 g/dL (ref 30.0–36.0)
MCV: 94.7 fL (ref 78.0–100.0)
Platelets: 184 10*3/uL (ref 150–400)
RDW: 15.2 % (ref 11.5–15.5)

## 2011-09-10 LAB — BASIC METABOLIC PANEL
BUN: 14 mg/dL (ref 6–23)
Calcium: 9.1 mg/dL (ref 8.4–10.5)
Creatinine, Ser: 1.11 mg/dL — ABNORMAL HIGH (ref 0.50–1.10)
GFR calc Af Amer: 58 mL/min — ABNORMAL LOW (ref >90)

## 2011-09-10 LAB — CARDIAC PANEL(CRET KIN+CKTOT+MB+TROPI): Total CK: 63 U/L (ref 7–177)

## 2011-09-10 MED ORDER — FUROSEMIDE 40 MG PO TABS
40.0000 mg | ORAL_TABLET | Freq: Every day | ORAL | Status: DC
  Administered 2011-09-10 – 2011-09-11 (×2): 40 mg via ORAL
  Filled 2011-09-10 (×2): qty 1

## 2011-09-10 NOTE — Progress Notes (Signed)
  Subjective: No events overnight.   Objective:  Vital signs in last 24 hours:  Filed Vitals:   09/09/11 1023 09/09/11 1330 09/09/11 2100 09/10/11 0600  BP: 119/70 114/80 115/76 127/74  Pulse:  72 67 70  Temp:  98.4 F (36.9 C) 98.3 F (36.8 C) 98.2 F (36.8 C)  TempSrc:   Oral Oral  Resp:  18 20 18   Height:      Weight:      SpO2:  99% 97% 97%    Intake/Output from previous day:  No intake or output data in the 24 hours ending 09/10/11 1325  Physical Exam: General: Alert, awake, oriented x3, in no acute distress. HEENT: No bruits, no goiter. Heart: Regular rate and rhythm, without murmurs, rubs, gallops. Lungs: Clear to auscultation bilaterally. Abdomen: Soft, nontender, nondistended, positive bowel sounds. Extremities: No clubbing cyanosis or edema with positive pedal pulses. Neuro: Grossly intact, nonfocal.   Lab Results:  Basic Metabolic Panel:    Component Value Date/Time   NA 142 09/10/2011 0715   K 3.5 09/10/2011 0715   CL 106 09/10/2011 0715   CO2 29 09/10/2011 0715   BUN 14 09/10/2011 0715   CREATININE 1.11* 09/10/2011 0715   GLUCOSE 105* 09/10/2011 0715   CALCIUM 9.1 09/10/2011 0715   CBC:    Component Value Date/Time   WBC 5.6 09/10/2011 0715   HGB 12.7 09/10/2011 0715   HCT 39.7 09/10/2011 0715   PLT 184 09/10/2011 0715   MCV 94.7 09/10/2011 0715   NEUTROABS 6.1 06/12/2011 0430   LYMPHSABS 2.1 06/12/2011 0430   MONOABS 0.5 06/12/2011 0430   EOSABS 0.0 06/12/2011 0430   BASOSABS 0.0 06/12/2011 0430    No results found for this or any previous visit (from the past 240 hour(s)).  Studies/Results: Chest Portable 1 View  09/09/2011   IMPRESSION:  1.  Vascular congestion and cardiomegaly, without significant pulmonary edema. 2.  Mild left basilar opacity likely reflects atelectasis.     Medications: Scheduled Meds:   . aspirin  81 mg Oral Daily  . clopidogrel  75 mg Oral Daily  . diltiazem  360 mg Oral Daily  . furosemide  40 mg  Intravenous Q6H  . heparin  5,000 Units Subcutaneous Q8H  . hydrALAZINE  25 mg Oral Q8H  . isosorbide mononitrate  60 mg Oral Daily  . metoprolol  50 mg Oral q12n4p  . moxifloxacin  400 mg Oral q1800  . olmesartan  40 mg Oral Daily  . pantoprazole  40 mg Oral Q1200  . potassium chloride SA  20 mEq Oral Daily  . rosuvastatin  10 mg Oral Daily  . sodium chloride  3 mL Intravenous Q12H   Continuous Infusions:   . sodium chloride     PRN Meds:.acetaminophen, albuterol, gi cocktail, magnesium hydroxide, ondansetron (ZOFRAN) IV, sodium chloride, DISCONTD: magnesium hydroxide  Assessment/Plan:  Principal Problem:  *CHF (congestive heart failure) - clinically improving, change Lasix to PO and continue to monitor strict I's and O's   Active Problems:  Hypertension - well controlled during this hospitalization, continue to monitor   Hyperlipemia  - continue statin   Coronary artery disease - continue isosorbide and diltiazem   Disposition -  Plan d/c in AM    LOS: 1 day   MAGICK-Massiah Longanecker 09/10/2011, 1:25 PM

## 2011-09-11 LAB — CBC
MCHC: 31.8 g/dL (ref 30.0–36.0)
Platelets: 187 10*3/uL (ref 150–400)
RDW: 15.1 % (ref 11.5–15.5)
WBC: 5.4 10*3/uL (ref 4.0–10.5)

## 2011-09-11 LAB — BASIC METABOLIC PANEL
BUN: 16 mg/dL (ref 6–23)
Chloride: 106 mEq/L (ref 96–112)
Creatinine, Ser: 1.21 mg/dL — ABNORMAL HIGH (ref 0.50–1.10)
GFR calc Af Amer: 52 mL/min — ABNORMAL LOW (ref >90)
GFR calc non Af Amer: 45 mL/min — ABNORMAL LOW (ref >90)
Potassium: 3.6 mEq/L (ref 3.5–5.1)

## 2011-09-11 MED ORDER — MOXIFLOXACIN HCL 400 MG PO TABS: 400.0000 mg | ORAL_TABLET | Freq: Every day | ORAL | Status: AC

## 2011-09-11 MED ORDER — FUROSEMIDE 40 MG PO TABS: 20.0000 mg | ORAL_TABLET | Freq: Every day | ORAL | Status: DC

## 2011-09-11 MED ORDER — HYDROCODONE-ACETAMINOPHEN 5-325 MG PO TABS: 1.0000 | ORAL_TABLET | Freq: Four times a day (QID) | ORAL | Status: DC | PRN

## 2011-09-11 NOTE — Progress Notes (Signed)
Physical Therapy Evaluation Patient Details Name: [name] MRN: [id number] DOB: 07-23-1941 Today's Date: 09/11/2011  Problem List:  Patient Active Problem List  Diagnoses  . Coronary artery disease  . Hypertension  . Hyperlipemia  . Arthritis  . CHF (congestive heart failure)  . Vertigo  . TIA (transient ischemic attack)  . Pacemaker  . MI (myocardial infarction)  . Morbid obesity    Past Medical History:  Past Medical History  Diagnosis Date  . Coronary artery disease     Cath 04/2006 normal coronaries, mild-mod slow flow in all coronaries with slow distall runoff, mild-mod LV enlargement, EF 55%   . Hypertension   . Hyperlipemia   . Arthritis   . CHF (congestive heart failure)     05/2011 echo severe concentric LVH, normal systolic fxn, grade 1 diastolic dysfxn, paradoxical septal motion  . Vertigo   . 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 Pacemaker     For second degree HB  . Hypercholesterolemia   . MI (myocardial infarction)   . Morbid obesity   . Angina   . Shortness of breath   . Sleep apnea   . Stroke   . GERD (gastroesophageal reflux disease)   . Headache   . Dysrhythmia   . Peripheral vascular disease   . Pneumonia    Past Surgical History:  Past Surgical History  Procedure Date  . Pacemaker insertion   . Pacemaker insertion   . Vascular surgery   . Cardiac catheterization   . Dilation and curettage of uterus   . Insert / replace / remove pacemaker   . Tubal ligation     PT Assessment/Plan/Recommendation PT Assessment Clinical Impression Statement: Pt is a very pleasant 70 y/o AA female who was admitted for CP. Was receiving therapy at home for mobility/strengthening and vestibular rehab. I would recommend that these resume on d/c. Will benefit from acute PT for strengthening and mobility training and to prevent pt from relapsing while in hospital. PT  Recommendation/Assessment: Patient will need skilled PT in the acute care venue PT Problem List: Decreased strength;Decreased mobility;Decreased balance;Decreased activity tolerance PT Therapy Diagnosis : Difficulty walking;Abnormality of gait;Generalized weakness PT Plan PT Frequency: Min 3X/week PT Treatment/Interventions: Gait training;Stair training;Functional mobility training;Therapeutic exercise;Balance training;Neuromuscular re-education PT Recommendation Follow Up Recommendations: Home health PT Equipment Recommended: Tub/shower bench (pt having more difficulty getting in an out of tub at home) PT Goals  Acute Rehab PT Goals PT Goal Formulation: With patient Time For Goal Achievement: 7 days Pt will go Supine/Side to Sit: Independently PT Goal: Supine/Side to Sit - Progress: Progressing toward goal Pt will go Sit to Supine/Side: Independently PT Goal: Sit to Supine/Side - Progress: Progressing toward goal Pt will Transfer Sit to Stand/Stand to Sit: with modified independence PT Transfer Goal: Sit to Stand/Stand to Sit - Progress: Progressing toward goal Pt will Transfer Bed to Chair/Chair to Bed: with modified independence PT Transfer Goal: Bed to Chair/Chair to Bed - Progress: Progressing toward goal Pt will Ambulate: >150 feet;with modified independence;with least restrictive assistive device;with gait velocity >(comment) ft/second (>/=1.81 ft/sec) PT Goal: Ambulate - Progress: Progressing toward goal Pt will Go Up / Down Stairs: 1-2 stairs;with least restrictive assistive device PT Goal: Up/Down Stairs - Progress: Progressing toward goal Pt will Perform Home Exercise Program: Independently PT Goal: Perform Home Exercise Program - Progress: Progressing toward goal Additional Goals Additional Goal #1: Pt will demonstrate decreased risk  of falls with TUG score less than or equal to 13.5 seconds PT Goal: Additional Goal #1 - Progress: Progressing toward goal  PT  Evaluation Precautions/Restrictions    Prior Functioning  Home Living Lives With: Alone Receives Help From: Family;Friend(s);Home health (was receiving HHPT) Type of Home: House Home Layout: One level Home Access: Stairs to enter Entrance Stairs-Rails: None Entrance Stairs-Number of Steps: 1 Bathroom Shower/Tub: Chiropodist: Standard Home Adaptive Equipment: Bedside commode/3-in-1;Walker - rolling;Straight cane Prior Function Level of Independence: Requires assistive device for independence;Needs assistance with homemaking Meal Prep: Minimal (can prepare simple meals, church friends help out) Light Housekeeping: Minimal Driving: No Vocation: Unemployed Public librarian Arousal/Alertness: Awake/alert Overall Cognitive Status: Appears within functional limits for tasks assessed Sensation/Coordination Sensation Light Touch: Appears Intact Extremity Assessment RUE Assessment RUE Assessment: Within Functional Limits LUE Assessment LUE Assessment: Within Functional Limits RLE Strength RLE Overall Strength Comments: grossly 4/5 with functional movement, see LLE for more details LLE Strength LLE Overall Strength Comments: grossly 4/5 with functional movement, some difficulty rising from chair but likely a combination of arthritis pain, low chair and decreased strength in legs Mobility (including Balance) Bed Mobility Bed Mobility: No Transfers Transfers: Yes Sit to Stand: From chair/3-in-1;5: Supervision Stand to Sit: To chair/3-in-1;5: Supervision Ambulation/Gait Ambulation/Gait: Yes Ambulation/Gait Assistance: 5: Supervision Ambulation/Gait Assistance Details (indicate cue type and reason): pt amb. approx. 100 ft with RW and slow shuffled gait. Pt with good safety awareness but occassional cues for picking feet up as she tends to drag them through secondary to arthritis pain in knees.  Assistive device: Rolling walker Gait Pattern: Shuffle;Trunk flexed   Posture/Postural Control Posture/Postural Control: No significant limitations Exercise    End of Session PT - End of Session Activity Tolerance: Patient tolerated treatment well;Patient limited by fatigue Patient left: in chair Nurse Communication: Mobility status for transfers;Mobility status for ambulation General Behavior During Session: [facility] for tasks performed Cognition: [facility] for tasks performed  [place] 09/11/2011, 10:30 AM

## 2011-09-11 NOTE — Discharge Summary (Signed)
[name] MRN: [id number] DOB/AGE: 1941/10/19 70 y.o.  Admit date: 09/09/2011 Discharge date: 09/11/2011  Primary Care Physician:  Birdie Riddle, MD, MD  Discharge Diagnoses:    Present on Admission:  .CHF (congestive heart failure) - grade I diastolic dysfunction per 2D ECHO in 2011 with EF 45-50% .Coronary artery disease .Hyperlipemia .Hypertension .Pacemaker  Principal Problem:  *CHF (congestive heart failure) Active Problems:  Hypertension  Hyperlipemia  Coronary artery disease  Pacemaker   Current Discharge Medication List    START taking these medications   Details  moxifloxacin (AVELOX) 400 MG tablet Take 1 tablet (400 mg total) by mouth daily at 6 PM. Qty: 7 tablet, Refills: 0      CONTINUE these medications which have CHANGED   Details  furosemide (LASIX) 40 MG tablet Take 0.5 tablets (20 mg total) by mouth daily. For fluid Qty: 31 tablet, Refills: 3    HYDROcodone-acetaminophen (NORCO) 5-325 MG per tablet Take 1 tablet by mouth every 6 (six) hours as needed. For pain  Qty: 45 tablet, Refills: 0      CONTINUE these medications which have NOT CHANGED   Details  aspirin 81 MG tablet Take 81 mg by mouth daily.      Cholecalciferol (VITAMIN D) 2000 UNITS tablet Take 2,000 Units by mouth daily.      clopidogrel (PLAVIX) 75 MG tablet Take 75 mg by mouth daily.     diltiazem (CARDIZEM CD) 360 MG 24 hr capsule Take 360 mg by mouth daily.      fluticasone (FLONASE) 50 MCG/ACT nasal spray Place 2 sprays into the nose daily.     Ginkgo 60 MG TABS Take 2 tablets by mouth 2 (two) times daily.     hydrALAZINE (APRESOLINE) 25 MG tablet Take 25 mg by mouth every 8 (eight) hours.     isosorbide mononitrate (IMDUR) 60 MG 24 hr tablet Take 60 mg by mouth daily.      meclizine (ANTIVERT) 25 MG tablet Take 12.5-25 mg by mouth 3 (three) times daily as needed. For dizziness    metoCLOPramide (REGLAN) 10 MG tablet Take 10 mg by mouth every  6 (six) hours as needed. For indigestion    metoprolol (TOPROL-XL) 100 MG 24 hr tablet Take 100 mg by mouth 2 (two) times daily.     nitroGLYCERIN (NITROSTAT) 0.4 MG SL tablet Place 0.4 mg under the tongue every 5 (five) minutes as needed.      olmesartan (BENICAR) 40 MG tablet Take 40 mg by mouth daily.      pantoprazole (PROTONIX) 40 MG tablet Take 40 mg by mouth daily.      rosuvastatin (CRESTOR) 10 MG tablet Take 10 mg by mouth daily.      traMADol (ULTRAM) 50 MG tablet Take 50 mg by mouth 2 (two) times daily as needed. For pain    potassium chloride SA (K-DUR,KLOR-CON) 20 MEQ tablet Take 20 mEq by mouth daily.          Disposition and Follow-up: Pt will need to follow up with PCP Dr. Karlton Lemon in ~2 weeks. Pt will need to have BMP checked to ensure that her Cr remains stable and at pt's baseline. Please note that chart review indicates Cr baseline 1-1.2. I have decreased her dose of Lasix from 40 mg QD to 20 mg QD and this dose can be readjusted if indicated based on clinical status and renal function. There was also some concern of PNA and pt will be empirically treated with  Avalox for 7 days upon discharge. Pt was educated on the treatment plan and has verbalized the understanding.   Consults:  none  Significant Diagnostic Studies:  Chest Portable 1 View  2011/09/18    IMPRESSION:  1.  Vascular congestion and cardiomegaly, without significant pulmonary edema. 2.  Mild left basilar opacity likely reflects atelectasis.    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 She felt weak in the legs but not dizzy and did not pass out,  no diaphoresis. She called EMS and was given several rounds of nitroglycerin and started feeling better, and continued to feel better with nitro in the ED. On further questioning she also states that her visiting nurse measured her weight Monday and she was 2 lbs down, but then Tuesday was 6 lbs up, and that her legs are more swollen. She usually sleeps with her legs somewhat elevated, but also with 3 pillows because if totally flat she gets the feeling of chest congestion. She denies frank chest pressure, chest pain, angina. However, she also endorses frequent episodes of GERD for which she has been taking Protonix; she ate some baked chicken earlier tonight but nothing else particularly noxious or acidic, no coffee, alcohol, and she is a never smoker.    Physical Exam on Discharge:  Filed Vitals:   09/10/11 1800 09/10/11 2100 09/11/11 0500 09/11/11 0603  BP: 129/57 109/71 133/77 138/85  Pulse: 69 69 70   Temp: 98.5 F (36.9 C) 98.3 F (36.8 C) 98.1 F (36.7 C)   TempSrc: Oral Oral Oral   Resp: 20 18 20    Height:      Weight:   154.5 kg (340 lb 9.8 oz)   SpO2: 98% 96% 97%      Intake/Output Summary (Last 24 hours) at 09/11/11 1037 Last data filed at 09/11/11 0900  Gross per 24 hour  Intake    720 ml  Output    405 ml  Net    315 ml    General: Alert, awake, oriented x3, in no acute distress. HEENT: No bruits, no goiter. Heart: Regular rate and rhythm, without murmurs, rubs, gallops. Lungs: Clear to auscultation bilaterally. Abdomen: Soft, nontender, nondistended, positive bowel sounds. Extremities: No clubbing cyanosis or edema with positive pedal pulses. Neuro: Grossly intact, nonfocal.  CBC:    Component Value Date/Time   WBC 5.4 09/11/2011 0600   HGB 12.2 09/11/2011 0600   HCT 38.4 09/11/2011 0600   PLT 187 09/11/2011 0600   MCV 95.5 09/11/2011 0600   NEUTROABS 6.1 06/12/2011 0430   LYMPHSABS 2.1 06/12/2011 0430   MONOABS 0.5 06/12/2011 0430   EOSABS 0.0 06/12/2011  0430   BASOSABS 0.0 06/12/2011 99991111    Basic Metabolic Panel:    Component Value Date/Time   NA 143 09/11/2011 0600   K 3.6 09/11/2011 0600   CL 106 09/11/2011 0600   CO2 28 09/11/2011 0600   BUN 16 09/11/2011 0600   CREATININE 1.21* 09/11/2011 0600   GLUCOSE 100* 09/11/2011 0600   CALCIUM 8.8 09/11/2011 0600    Hospital Course:  Principal Problem:  *CHF (congestive heart failure) - pt has  responded well to diuresis and has maintained the oxygen saturation above 95% on RA. She has maintained good urine output and had no physical exam findings of volume overload upon discharge. She will need to continue taking lasix which was decreased from 40 mg QD to 20 mg QD dose due to renal function test results. She will have to have follow up in outpt setting to ensure renal function remains at her baseline.  Active Problems:  Hypertension - well controlled during the hospitalization   Hyperlipemia - well controlled on current medication regimen   Disposition - pt will be discharged home today, discussed treatment and plan and pt verbalized the understanding   Time spent on Discharge: Over 30 minutes  Signed: MAGICK-MYERS, ISKRA 09/11/2011, 10:37 AM

## 2011-09-11 NOTE — Progress Notes (Signed)
Contacted [facility] to make aware of pt's scheduled d/c home today. Pt had HH prior to admission. She has contact information. Faxed resumption orders to agency.

## 2011-09-13 NOTE — Progress Notes (Signed)
PT DC'D TO HOME AND WAS TO RESUME CARE WITH [facility], ORDERS AND DC SUMMARY FAXED TO THEM OVER THE WEEKEND.   Chauncy Lean [phone] OR [phone] 09/13/2011

## 2011-09-19 ENCOUNTER — Emergency Department (HOSPITAL_BASED_OUTPATIENT_CLINIC_OR_DEPARTMENT_OTHER)
Admission: EM | Admit: 2011-09-19 | Discharge: 2011-09-19 | Disposition: A | Discharge status: Home / Self Care | Payer: Medicare Other | Attending: Emergency Medicine | Admitting: Emergency Medicine

## 2011-09-19 ENCOUNTER — Other Ambulatory Visit: Payer: Self-pay

## 2011-09-19 ENCOUNTER — Emergency Department (INDEPENDENT_AMBULATORY_CARE_PROVIDER_SITE_OTHER): Payer: Medicare Other

## 2011-09-19 ENCOUNTER — Encounter (HOSPITAL_BASED_OUTPATIENT_CLINIC_OR_DEPARTMENT_OTHER): Payer: Self-pay

## 2011-09-19 DIAGNOSIS — R059 Cough, unspecified: Secondary | ICD-10-CM | POA: Insufficient documentation

## 2011-09-19 DIAGNOSIS — J00 Acute nasopharyngitis [common cold]: Secondary | ICD-10-CM

## 2011-09-19 DIAGNOSIS — R109 Unspecified abdominal pain: Secondary | ICD-10-CM

## 2011-09-19 DIAGNOSIS — I2510 Atherosclerotic heart disease of native coronary artery without angina pectoris: Secondary | ICD-10-CM | POA: Insufficient documentation

## 2011-09-19 DIAGNOSIS — E785 Hyperlipidemia, unspecified: Secondary | ICD-10-CM | POA: Insufficient documentation

## 2011-09-19 DIAGNOSIS — R05 Cough: Secondary | ICD-10-CM

## 2011-09-19 DIAGNOSIS — I10 Essential (primary) hypertension: Secondary | ICD-10-CM | POA: Insufficient documentation

## 2011-09-19 DIAGNOSIS — Z79899 Other long term (current) drug therapy: Secondary | ICD-10-CM | POA: Insufficient documentation

## 2011-09-19 DIAGNOSIS — Z8739 Personal history of other diseases of the musculoskeletal system and connective tissue: Secondary | ICD-10-CM | POA: Insufficient documentation

## 2011-09-19 DIAGNOSIS — R1033 Periumbilical pain: Secondary | ICD-10-CM | POA: Insufficient documentation

## 2011-09-19 DIAGNOSIS — J3489 Other specified disorders of nose and nasal sinuses: Secondary | ICD-10-CM | POA: Insufficient documentation

## 2011-09-19 DIAGNOSIS — I509 Heart failure, unspecified: Secondary | ICD-10-CM | POA: Insufficient documentation

## 2011-09-19 LAB — COMPREHENSIVE METABOLIC PANEL
ALT: 15 U/L (ref 0–35)
Alkaline Phosphatase: 99 U/L (ref 39–117)
CO2: 27 mEq/L (ref 19–32)
Calcium: 9.4 mg/dL (ref 8.4–10.5)
GFR calc Af Amer: 59 mL/min — ABNORMAL LOW (ref >90)
GFR calc non Af Amer: 51 mL/min — ABNORMAL LOW (ref >90)
Glucose, Bld: 120 mg/dL — ABNORMAL HIGH (ref 70–99)
Potassium: 4.1 mEq/L (ref 3.5–5.1)
Sodium: 138 mEq/L (ref 135–145)

## 2011-09-19 LAB — CBC
Hemoglobin: 13.1 g/dL (ref 12.0–15.0)
MCH: 30.1 pg (ref 26.0–34.0)
RBC: 4.35 MIL/uL (ref 3.87–5.11)

## 2011-09-19 LAB — URINALYSIS, ROUTINE W REFLEX MICROSCOPIC
Bilirubin Urine: NEGATIVE
Hgb urine dipstick: NEGATIVE
Protein, ur: NEGATIVE mg/dL
Urobilinogen, UA: 0.2 mg/dL (ref 0.0–1.0)

## 2011-09-19 MED ORDER — SODIUM CHLORIDE 0.9 % IV SOLN
INTRAVENOUS | Status: DC
  Administered 2011-09-19: 07:00:00 via INTRAVENOUS

## 2011-09-19 MED ORDER — METOCLOPRAMIDE HCL 5 MG/ML IJ SOLN
INTRAMUSCULAR | Status: AC
  Administered 2011-09-19: 10 mg via INTRAVENOUS
  Filled 2011-09-19: qty 2

## 2011-09-19 MED ORDER — MORPHINE SULFATE 4 MG/ML IJ SOLN
4.0000 mg | Freq: Once | INTRAMUSCULAR | Status: AC
  Administered 2011-09-19: 4 mg via INTRAVENOUS

## 2011-09-19 MED ORDER — HYDROCODONE-ACETAMINOPHEN 5-500 MG PO TABS: 1.0000 | ORAL_TABLET | Freq: Four times a day (QID) | ORAL | Status: AC | PRN

## 2011-09-19 MED ORDER — METOCLOPRAMIDE HCL 5 MG/ML IJ SOLN
10.0000 mg | Freq: Once | INTRAMUSCULAR | Status: AC
  Administered 2011-09-19: 10 mg via INTRAVENOUS

## 2011-09-19 MED ORDER — AZITHROMYCIN 250 MG PO TABS: 250.0000 mg | ORAL_TABLET | Freq: Every day | ORAL | Status: AC

## 2011-09-19 MED ORDER — MORPHINE SULFATE 4 MG/ML IJ SOLN
INTRAMUSCULAR | Status: AC
  Administered 2011-09-19: 4 mg via INTRAVENOUS
  Filled 2011-09-19: qty 1

## 2011-09-19 NOTE — ED Notes (Signed)
Pt states that she has had a cough and cold for the past several days.  Pt states that she had onset of severe abdominal pain today, unrelenting.  10/10.

## 2011-09-19 NOTE — ED Provider Notes (Signed)
History     CSN: [id number] Arrival date & time: 09/19/2011  5:50 AM   First MD Initiated Contact with Patient 09/19/11 0602      Chief Complaint  Patient presents with  . Nasal Congestion  . Cough  . Abdominal Pain    (Consider location/radiation/quality/duration/timing/severity/associated sxs/prior treatment) HPI  Past Medical History  Diagnosis Date  . Coronary artery disease     Cath 04/2006 normal coronaries, mild-mod slow flow in all coronaries with slow distall runoff, mild-mod LV enlargement, EF 55%   . Hypertension   . Hyperlipemia   . Arthritis   . CHF (congestive heart failure)     05/2011 echo severe concentric LVH, normal systolic fxn, grade 1 diastolic dysfxn, paradoxical septal motion  . Vertigo   . 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 Pacemaker     For second degree HB  . Hypercholesterolemia   . MI (myocardial infarction)   . Morbid obesity   . Angina   . Shortness of breath   . Sleep apnea   . Stroke   . GERD (gastroesophageal reflux disease)   . Headache   . Dysrhythmia   . Peripheral vascular disease   . Pneumonia     Past Surgical History  Procedure Date  . Pacemaker insertion   . Pacemaker insertion   . Vascular surgery   . Cardiac catheterization   . Dilation and curettage of uterus   . Insert / replace / remove pacemaker   . Tubal ligation     History reviewed. No pertinent family history.  History  Substance Use Topics  . Smoking status: Never Smoker   . Smokeless tobacco: Never Used  . Alcohol Use: No    OB History    Grav Para Term Preterm Abortions TAB SAB Ect Mult Living                  Review of Systems  Allergies  Ampicillin  Home Medications   Current Outpatient Rx  Name Route Sig Dispense Refill  . ASPIRIN 81 MG PO TABS Oral Take 81 mg by mouth daily.      [name] Kitchen VITAMIN D 2000 UNITS PO TABS Oral Take 2,000 Units by mouth daily.      [name] Kitchen CLOPIDOGREL  BISULFATE 75 MG PO TABS Oral Take 75 mg by mouth daily.     [name] Kitchen DILTIAZEM HCL COATED BEADS 360 MG PO CP24 Oral Take 360 mg by mouth daily.      [name] Kitchen FLUTICASONE PROPIONATE 50 MCG/ACT NA SUSP Nasal Place 2 sprays into the nose daily.     . FUROSEMIDE 40 MG PO TABS Oral Take 0.5 tablets (20 mg total) by mouth daily. For fluid 31 tablet 3  . GINKGO 60 MG PO TABS Oral Take 2 tablets by mouth 2 (two) times daily.     [name] Kitchen HYDRALAZINE HCL 25 MG PO TABS Oral Take 25 mg by mouth every 8 (eight) hours.     [name] Kitchen HYDROCODONE-ACETAMINOPHEN 5-325 MG PO TABS Oral Take 1 tablet by mouth every 6 (six) hours as needed. For pain  45 tablet 0  . ISOSORBIDE MONONITRATE ER 60 MG PO TB24 Oral Take 60 mg by mouth daily.      [name] Kitchen MECLIZINE HCL 25 MG PO TABS Oral Take 12.5-25 mg by mouth 3 (three) times daily as needed. For dizziness    . METOCLOPRAMIDE HCL 10 MG PO TABS Oral Take 10  mg by mouth every 6 (six) hours as needed. For indigestion    . METOPROLOL SUCCINATE 100 MG PO TB24 Oral Take 100 mg by mouth 2 (two) times daily.     [name] Kitchen MOXIFLOXACIN HCL 400 MG PO TABS Oral Take 1 tablet (400 mg total) by mouth daily at 6 PM. 7 tablet 0  . NITROGLYCERIN 0.4 MG SL SUBL Sublingual Place 0.4 mg under the tongue every 5 (five) minutes as needed.      [name] Kitchen OLMESARTAN MEDOXOMIL 40 MG PO TABS Oral Take 40 mg by mouth daily.      [name] Kitchen PANTOPRAZOLE SODIUM 40 MG PO TBEC Oral Take 40 mg by mouth daily.      [name] Kitchen POTASSIUM CHLORIDE CRYS CR 20 MEQ PO TBCR Oral Take 20 mEq by mouth daily.      [name] Kitchen ROSUVASTATIN CALCIUM 10 MG PO TABS Oral Take 10 mg by mouth daily.      . TRAMADOL HCL 50 MG PO TABS Oral Take 50 mg by mouth 2 (two) times daily as needed. For pain      BP 175/81  Pulse 75  Temp(Src) 101.4 F (38.6 C) (Oral)  Resp 21  Ht 5\' 6"  (1.676 m)  Wt 340 lb (154.223 kg)  BMI 54.88 kg/m2  SpO2 95%  Physical Exam  ED Course  Procedures (including critical care time)  Labs Reviewed  COMPREHENSIVE METABOLIC PANEL - Abnormal; Notable for the  following:    Glucose, Bld 120 (*)    GFR calc non Af Amer 51 (*)    GFR calc Af Amer 59 (*)    All other components within normal limits  URINALYSIS, ROUTINE W REFLEX MICROSCOPIC - Abnormal; Notable for the following:    Appearance CLOUDY (*)    All other components within normal limits  CBC  LIPASE, BLOOD   Dg Chest 2 View  09/19/2011  *RADIOLOGY REPORT*  Clinical Data: Cough and cold  CHEST - 2 VIEW  Comparison: 09/09/2011  Findings: Right subclavian transvenous pacemaker stable.  Mild cardiomegaly stable.  Tortuous thoracic aorta . Lungs are clear. No effusion.  Minimal spurring in the thoracic spine.  IMPRESSION:  1.  Stable cardiomegaly.  No acute disease.  Original Report Authenticated By: Trecia Rogers, M.D.   Dg Abd 2 Views  09/19/2011  *RADIOLOGY REPORT*  Clinical Data: Abdominal pain, cough  ABDOMEN - 2 VIEW  Comparison: 09/09/2011 and earlier studies  Findings: Right subclavian transvenous pacemaker stable.  Moderate cardiomegaly stable.  No free air.  A few scattered gas filled nondistended small bowel loops throughout the abdomen.  Normal distribution of gas and stool throughout the colon.  No abnormal abdominal calcifications.  Regional bones unremarkable.  IMPRESSION:  1.  Nonobstructive bowel gas pattern. 2.  No free air. 3.  Stable cardiomegaly.  Original Report Authenticated By: Dillard Cannon III, M.D.     1. Abdominal pain   2. Cough       MDM  I assumed care from Dr. Canary Brim.  I agree with her note, assessment, and plan.  At the time of signout, we were waiting on labs, xrays, and iv hydration.  The xrays reveal no acute pathology, the labs are unremarkable.  I re-examined the patient and her abdomen is benign and she seems to be feeling better and wants to go home.   I will treat her cough with an antibiotic and prescribe pain medication for her abdominal pain, which I feel is due to coughing.  She  is to return should her condition worsen or  change.        Veryl Speak, MD 09/19/11 [phone]

## 2011-09-19 NOTE — ED Provider Notes (Signed)
History     CSN: [id number] Arrival date & time: 09/19/2011  5:50 AM   First MD Initiated Contact with Patient 09/19/11 0602      Chief Complaint  Patient presents with  . Nasal Congestion  . Cough  . Abdominal Pain    (Consider location/radiation/quality/duration/timing/severity/associated sxs/prior treatment) HPI Patient presents with complaint of nasal congestion cough and abdominal pain. She states her symptoms began approximately 2 days ago with a cough productive of yellowish sputum. The abdominal pain worsened tonight and is located around her umbilicus. She states she has had similar abdominal pain in the past and Reglan helps this pain but states she has run out of this medication 2 days ago. She denies any fever or chills. She has chest soreness with coughing only. She is not any more short of breath than her baseline. She's had no increase in lower extremity edema. She's had no vomiting and has been able to drink liquids well. She denies any urinary symptoms. Her last bowel movement was yesterday without blood or melena. There no other associated symptoms. Nothing makes the pain better or worse.   Past Medical History  Diagnosis Date  . Coronary artery disease     Cath 04/2006 normal coronaries, mild-mod slow flow in all coronaries with slow distall runoff, mild-mod LV enlargement, EF 55%   . Hypertension   . Hyperlipemia   . Arthritis   . CHF (congestive heart failure)     05/2011 echo severe concentric LVH, normal systolic fxn, grade 1 diastolic dysfxn, paradoxical septal motion  . Vertigo   . 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 Pacemaker     For second degree HB  . Hypercholesterolemia   . MI (myocardial infarction)   . Morbid obesity   . Angina   . Shortness of breath   . Sleep apnea   . Stroke   . GERD (gastroesophageal reflux disease)   . Headache   . Dysrhythmia   . Peripheral vascular disease     . Pneumonia     Past Surgical History  Procedure Date  . Pacemaker insertion   . Pacemaker insertion   . Vascular surgery   . Cardiac catheterization   . Dilation and curettage of uterus   . Insert / replace / remove pacemaker   . Tubal ligation     History reviewed. No pertinent family history.  History  Substance Use Topics  . Smoking status: Never Smoker   . Smokeless tobacco: Never Used  . Alcohol Use: No    OB History    Grav Para Term Preterm Abortions TAB SAB Ect Mult Living                  Review of Systems ROS reviewed and otherwise negative except for mentioned in HPI  Allergies  Ampicillin  Home Medications   Current Outpatient Rx  Name Route Sig Dispense Refill  . ASPIRIN 81 MG PO TABS Oral Take 81 mg by mouth daily.      [name] Kitchen VITAMIN D 2000 UNITS PO TABS Oral Take 2,000 Units by mouth daily.      [name] Kitchen CLOPIDOGREL BISULFATE 75 MG PO TABS Oral Take 75 mg by mouth daily.     [name] Kitchen DILTIAZEM HCL COATED BEADS 360 MG PO CP24 Oral Take 360 mg by mouth daily.      [name] Kitchen FLUTICASONE PROPIONATE 50 MCG/ACT NA SUSP Nasal Place 2 sprays into  the nose daily.     . FUROSEMIDE 40 MG PO TABS Oral Take 0.5 tablets (20 mg total) by mouth daily. For fluid 31 tablet 3  . GINKGO 60 MG PO TABS Oral Take 2 tablets by mouth 2 (two) times daily.     [name] Kitchen HYDRALAZINE HCL 25 MG PO TABS Oral Take 25 mg by mouth every 8 (eight) hours.     [name] Kitchen HYDROCODONE-ACETAMINOPHEN 5-325 MG PO TABS Oral Take 1 tablet by mouth every 6 (six) hours as needed. For pain  45 tablet 0  . ISOSORBIDE MONONITRATE ER 60 MG PO TB24 Oral Take 60 mg by mouth daily.      [name] Kitchen MECLIZINE HCL 25 MG PO TABS Oral Take 12.5-25 mg by mouth 3 (three) times daily as needed. For dizziness    . METOCLOPRAMIDE HCL 10 MG PO TABS Oral Take 10 mg by mouth every 6 (six) hours as needed. For indigestion    . METOPROLOL SUCCINATE 100 MG PO TB24 Oral Take 100 mg by mouth 2 (two) times daily.     [name] Kitchen MOXIFLOXACIN HCL 400 MG PO TABS Oral Take 1  tablet (400 mg total) by mouth daily at 6 PM. 7 tablet 0  . NITROGLYCERIN 0.4 MG SL SUBL Sublingual Place 0.4 mg under the tongue every 5 (five) minutes as needed.      [name] Kitchen OLMESARTAN MEDOXOMIL 40 MG PO TABS Oral Take 40 mg by mouth daily.      [name] Kitchen PANTOPRAZOLE SODIUM 40 MG PO TBEC Oral Take 40 mg by mouth daily.      [name] Kitchen POTASSIUM CHLORIDE CRYS CR 20 MEQ PO TBCR Oral Take 20 mEq by mouth daily.      [name] Kitchen ROSUVASTATIN CALCIUM 10 MG PO TABS Oral Take 10 mg by mouth daily.      . TRAMADOL HCL 50 MG PO TABS Oral Take 50 mg by mouth 2 (two) times daily as needed. For pain      BP 175/81  Pulse 75  Temp(Src) 101.4 F (38.6 C) (Oral)  Resp 21  Ht 5\' 6"  (1.676 m)  Wt 340 lb (154.223 kg)  BMI 54.88 kg/m2  SpO2 95% Vitals reviewed Physical Exam Physical Examination: General appearance - alert, well appearing, and in no distress Mental status - alert, oriented to person, place, and time Eyes - pupils equal and reactive, no scleral icterus, no conjunctival injection Mouth - mucous membranes moist, pharynx normal without lesions Chest - clear to auscultation, no wheezes, rales or rhonchi, symmetric air entry Heart - normal rate, regular rhythm, normal S1, S2, no murmurs, rubs, clicks or gallops Abdomen - soft, nontender, nondistended, no masses or organomegaly Neurological - sensory and motor exam grossly intact in extremities x 4 Musculoskeletal - no joint tenderness, deformity or swelling Extremities - peripheral pulses normal, no pedal edema, no clubbing or cyanosis Skin - normal coloration and turgor, no rashes  ED Course  Procedures (including critical care time)   Date: 09/19/2011  Rate: 106  Rhythm: ventricular paced rhythm  QRS Axis: normal  Intervals: indetrminate  ST/T Wave abnormalities: indeterminate  Conduction Disutrbances:paced  Narrative Interpretation:   Old EKG Reviewed: no change since prior ekg of 09/09/11    Labs Reviewed  CBC  COMPREHENSIVE METABOLIC PANEL  LIPASE,  BLOOD  URINALYSIS, ROUTINE W REFLEX MICROSCOPIC   No results found.   No diagnosis found.    MDM  Patient presented with complaint of cough as well as periumbilical abdominal pain. She states the cough has been  present several days and the abdominal pain began last night. She has had similar pain in the past and takes pantoprazole as well as Reglan. She states she ran out of Reglan 2 days ago. She has not had any vomiting. She has no increase in her baseline shortness of breath or in her peripheral edema. Labs obtained are reassuring. A chest x-ray and acute abdominal series was ordered as well as urine testing. She was treated with morphine and Reglan.  I anticipate signing this patient out to Dr. Stark Jock pending urinalysis and xray results.   If all are normal pt can be safely discharged home to follow up with Dr. Robby Sermon.          Threasa Beards, MD 09/19/11 [phone]

## 2012-02-24 ENCOUNTER — Other Ambulatory Visit: Payer: Self-pay | Admitting: Internal Medicine

## 2012-02-24 DIAGNOSIS — Z1231 Encounter for screening mammogram for malignant neoplasm of breast: Secondary | ICD-10-CM

## 2012-03-07 ENCOUNTER — Ambulatory Visit: Payer: Medicare Other

## 2012-03-15 ENCOUNTER — Ambulatory Visit (HOSPITAL_COMMUNITY)
Admission: RE | Admit: 2012-03-15 | Discharge: 2012-03-15 | Disposition: A | Discharge status: Home / Self Care | Payer: Medicare Other | Source: Ambulatory Visit | Attending: Cardiovascular Disease | Admitting: Cardiovascular Disease

## 2012-03-15 DIAGNOSIS — I219 Acute myocardial infarction, unspecified: Secondary | ICD-10-CM

## 2012-03-15 DIAGNOSIS — I517 Cardiomegaly: Secondary | ICD-10-CM | POA: Insufficient documentation

## 2012-03-15 DIAGNOSIS — I509 Heart failure, unspecified: Secondary | ICD-10-CM

## 2012-03-15 DIAGNOSIS — Q233 Congenital mitral insufficiency: Secondary | ICD-10-CM | POA: Insufficient documentation

## 2012-03-15 NOTE — Progress Notes (Signed)
  Echocardiogram 2D Echocardiogram has been performed.  Cleveland Yarbro, Orlena Sheldon 03/15/2012, 2:15 PM

## 2012-05-24 IMAGING — CR DG CHEST 2V
1 series · 1 of 1 positions shown · non-contrast
Comparison: Chest radiograph performed 03/15/2011

CLINICAL DATA: Dizziness, nausea and vomiting.

CHEST - 2 VIEW

[view not recorded]
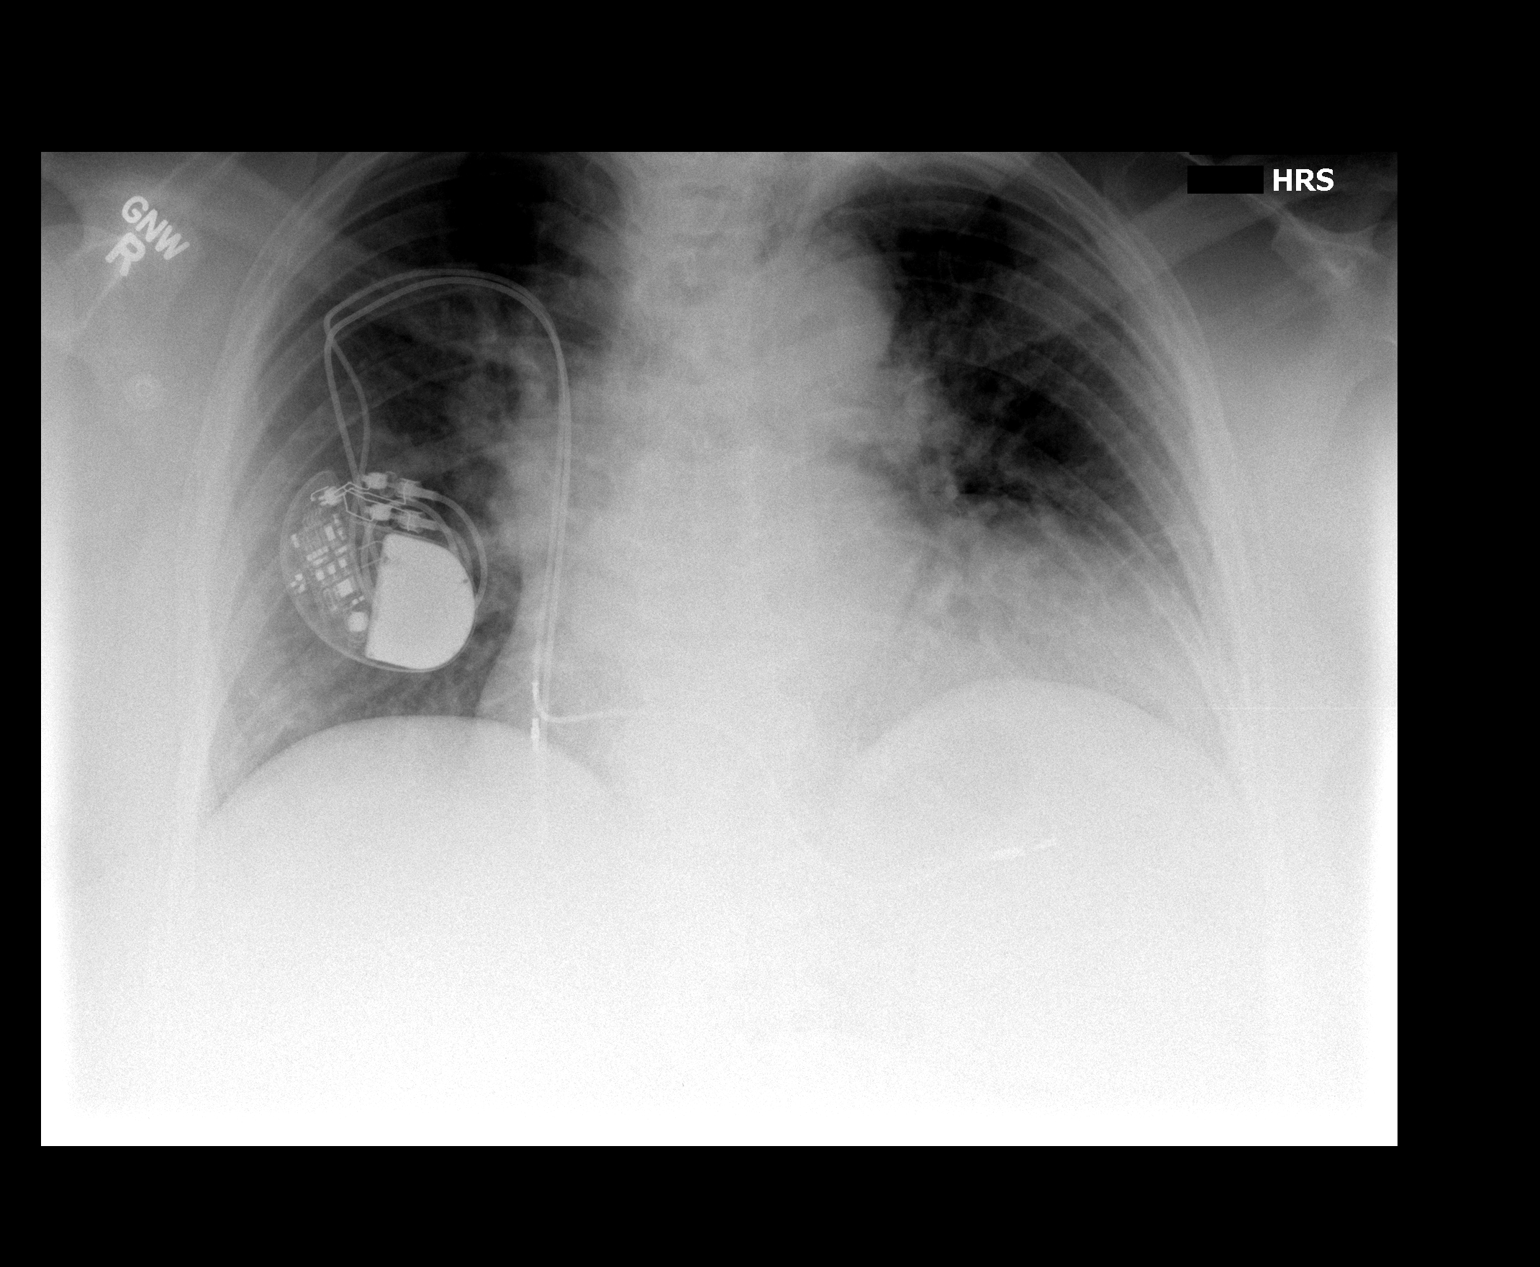

[1 of 1 positions shown; findings below may reference images not displayed]

FINDINGS: The lungs are hypoexpanded, with vascular crowding; mild
underlying vascular congestion is likely present.  Mild left
basilar atelectasis is noted.  The lungs otherwise appear clear; no
pleural effusion or pneumothorax is seen.

The cardiomediastinal silhouette remains mildly enlarged.  A
pacemaker is noted at the right chest wall, with leads ending at
the right atrium and right ventricle.  No acute osseous
abnormalities are seen.
IMPRESSION: 1.  Lungs hypoexpanded, with mild left basilar atelectasis.
2.  Mild vascular congestion and mild cardiomegaly noted.

## 2012-05-30 ENCOUNTER — Emergency Department (HOSPITAL_BASED_OUTPATIENT_CLINIC_OR_DEPARTMENT_OTHER): Payer: Medicare Other

## 2012-05-30 ENCOUNTER — Encounter (HOSPITAL_BASED_OUTPATIENT_CLINIC_OR_DEPARTMENT_OTHER): Payer: Self-pay | Admitting: *Deleted

## 2012-05-30 ENCOUNTER — Emergency Department (HOSPITAL_BASED_OUTPATIENT_CLINIC_OR_DEPARTMENT_OTHER)
Admission: EM | Admit: 2012-05-30 | Discharge: 2012-05-30 | Disposition: A | Discharge status: Home / Self Care | Payer: Medicare Other | Attending: Emergency Medicine | Admitting: Emergency Medicine

## 2012-05-30 DIAGNOSIS — I2510 Atherosclerotic heart disease of native coronary artery without angina pectoris: Secondary | ICD-10-CM | POA: Insufficient documentation

## 2012-05-30 DIAGNOSIS — E785 Hyperlipidemia, unspecified: Secondary | ICD-10-CM | POA: Insufficient documentation

## 2012-05-30 DIAGNOSIS — K5900 Constipation, unspecified: Secondary | ICD-10-CM | POA: Insufficient documentation

## 2012-05-30 DIAGNOSIS — I10 Essential (primary) hypertension: Secondary | ICD-10-CM | POA: Insufficient documentation

## 2012-05-30 DIAGNOSIS — Z8739 Personal history of other diseases of the musculoskeletal system and connective tissue: Secondary | ICD-10-CM | POA: Insufficient documentation

## 2012-05-30 DIAGNOSIS — I509 Heart failure, unspecified: Secondary | ICD-10-CM | POA: Insufficient documentation

## 2012-05-30 DIAGNOSIS — E7800 Pure hypercholesterolemia, unspecified: Secondary | ICD-10-CM | POA: Insufficient documentation

## 2012-05-30 DIAGNOSIS — Z8673 Personal history of transient ischemic attack (TIA), and cerebral infarction without residual deficits: Secondary | ICD-10-CM | POA: Insufficient documentation

## 2012-05-30 DIAGNOSIS — R109 Unspecified abdominal pain: Secondary | ICD-10-CM | POA: Insufficient documentation

## 2012-05-30 DIAGNOSIS — Z7982 Long term (current) use of aspirin: Secondary | ICD-10-CM | POA: Insufficient documentation

## 2012-05-30 DIAGNOSIS — I252 Old myocardial infarction: Secondary | ICD-10-CM | POA: Insufficient documentation

## 2012-05-30 DIAGNOSIS — Z79899 Other long term (current) drug therapy: Secondary | ICD-10-CM | POA: Insufficient documentation

## 2012-05-30 LAB — CBC WITH DIFFERENTIAL/PLATELET
Basophils Absolute: 0 10*3/uL (ref 0.0–0.1)
Basophils Relative: 1 % (ref 0–1)
HCT: 41 % (ref 36.0–46.0)
Hemoglobin: 13.4 g/dL (ref 12.0–15.0)
Lymphocytes Relative: 25 % (ref 12–46)
MCH: 30.7 pg (ref 26.0–34.0)
MCV: 94 fL (ref 78.0–100.0)
Monocytes Absolute: 0.6 10*3/uL (ref 0.1–1.0)
Neutro Abs: 3.7 10*3/uL (ref 1.7–7.7)
Neutrophils Relative %: 61 % (ref 43–77)
RBC: 4.36 MIL/uL (ref 3.87–5.11)

## 2012-05-30 LAB — COMPREHENSIVE METABOLIC PANEL
ALT: 10 U/L (ref 0–35)
AST: 17 U/L (ref 0–37)
Albumin: 3.7 g/dL (ref 3.5–5.2)
Chloride: 104 mEq/L (ref 96–112)
Creatinine, Ser: 1.2 mg/dL — ABNORMAL HIGH (ref 0.50–1.10)
Sodium: 140 mEq/L (ref 135–145)
Total Bilirubin: 0.3 mg/dL (ref 0.3–1.2)

## 2012-05-30 LAB — LACTIC ACID, PLASMA: Lactic Acid, Venous: 1.5 mmol/L (ref 0.5–2.2)

## 2012-05-30 LAB — URINALYSIS, ROUTINE W REFLEX MICROSCOPIC
Glucose, UA: NEGATIVE mg/dL
Hgb urine dipstick: NEGATIVE
Ketones, ur: NEGATIVE mg/dL
Protein, ur: NEGATIVE mg/dL

## 2012-05-30 MED ORDER — SODIUM CHLORIDE 0.9 % IV BOLUS (SEPSIS)
500.0000 mL | Freq: Once | INTRAVENOUS | Status: AC
  Administered 2012-05-30: 1000 mL via INTRAVENOUS

## 2012-05-30 MED ORDER — MORPHINE SULFATE 4 MG/ML IJ SOLN
4.0000 mg | Freq: Once | INTRAMUSCULAR | Status: AC
  Administered 2012-05-30: 4 mg via INTRAVENOUS
  Filled 2012-05-30: qty 1

## 2012-05-30 MED ORDER — ONDANSETRON HCL 4 MG/2ML IJ SOLN
4.0000 mg | Freq: Once | INTRAMUSCULAR | Status: AC
  Administered 2012-05-30: 4 mg via INTRAVENOUS
  Filled 2012-05-30: qty 2

## 2012-05-30 MED ORDER — ONDANSETRON HCL 4 MG/2ML IJ SOLN: 4.0000 mg | Freq: Once | INTRAMUSCULAR | Status: DC

## 2012-05-30 MED ORDER — ONDANSETRON 8 MG PO TBDP: 8.0000 mg | ORAL_TABLET | Freq: Three times a day (TID) | ORAL | Status: AC | PRN

## 2012-05-30 NOTE — ED Notes (Signed)
Soup and applesauce given to pt. Per MD order.

## 2012-05-30 NOTE — ED Notes (Signed)
Patient transported to X-ray 

## 2012-05-30 NOTE — ED Notes (Signed)
Pt c/o lower abd pain  With n/v x 1 day.

## 2012-05-30 NOTE — ED Notes (Signed)
Pt. Tolerated meal. Denies c/o nausea.

## 2012-05-30 NOTE — ED Provider Notes (Signed)
History     CSN: [id number]  Arrival date & time 05/30/12  2046   First MD Initiated Contact with Patient 05/30/12 2304      Chief Complaint  Patient presents with  . Abdominal Pain    (Consider location/radiation/quality/duration/timing/severity/associated sxs/prior treatment) HPI Patient is a 71 year old female who presents today complaining of lower abdominal pain that she rates an 8/10. Patient denies any dysuria, hematuria, or increased urinary frequency. She endorses having nausea and one episode of vomiting. Patient denies any upper.no pain whatsoever. She endorses having some slight cough. She denies any fevers or sick contacts. Patient has had no diarrhea but does endorse having constipation. She reports her last bowel movement was a small one today. She does struggle with this. She denies history of diverticulitis or diverticulosis. Surgical history is significant only for tubal ligation. Patient has no history of bowel obstruction. She took an Aleve and reported that this made her pain worse. Nothing has made her pain better. Patient currently is slightly nauseous. There are no other associated or modifying factors. Past Medical History  Diagnosis Date  . Coronary artery disease     Cath 04/2006 normal coronaries, mild-mod slow flow in all coronaries with slow distall runoff, mild-mod LV enlargement, EF 55%   . Hypertension   . Hyperlipemia   . Arthritis   . CHF (congestive heart failure)     05/2011 echo severe concentric LVH, normal systolic fxn, grade 1 diastolic dysfxn, paradoxical septal motion  . Vertigo   . 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 Pacemaker     For second degree HB  . Hypercholesterolemia   . MI (myocardial infarction)   . Morbid obesity   . Angina   . Shortness of breath   . Sleep apnea   . Stroke   . GERD (gastroesophageal reflux disease)   . Headache   . Dysrhythmia   . Peripheral  vascular disease   . Pneumonia     Past Surgical History  Procedure Date  . Pacemaker insertion   . Pacemaker insertion   . Vascular surgery   . Cardiac catheterization   . Dilation and curettage of uterus   . Insert / replace / remove pacemaker   . Tubal ligation     History reviewed. No pertinent family history.  History  Substance Use Topics  . Smoking status: Never Smoker   . Smokeless tobacco: Never Used  . Alcohol Use: No    OB History    Grav Para Term Preterm Abortions TAB SAB Ect Mult Living                  Review of Systems  Constitutional: Negative.   HENT: Negative.   Eyes: Negative.   Respiratory: Negative.   Cardiovascular: Negative.   Gastrointestinal: Positive for nausea, vomiting, abdominal pain and constipation.  Genitourinary: Negative.   Musculoskeletal: Negative.   Neurological: Negative.   Hematological: Negative.   Psychiatric/Behavioral: Negative.   All other systems reviewed and are negative.    Allergies  Ampicillin  Home Medications   Current Outpatient Rx  Name Route Sig Dispense Refill  . ASPIRIN 81 MG PO TABS Oral Take 81 mg by mouth daily.     [name] Kitchen VITAMIN D 2000 UNITS PO TABS Oral Take 2,000 Units by mouth daily.     [name] Kitchen CLOPIDOGREL BISULFATE 75 MG PO TABS Oral Take 75 mg by mouth daily.     [name] Kitchen  DILTIAZEM HCL ER COATED BEADS 360 MG PO CP24 Oral Take 360 mg by mouth daily.     [name] Kitchen DOXYCYCLINE HYCLATE 100 MG PO CAPS Oral Take 100 mg by mouth 2 (two) times daily.    . FUROSEMIDE 40 MG PO TABS Oral Take 0.5 tablets (20 mg total) by mouth daily. For fluid 31 tablet 3  . HYDRALAZINE HCL 25 MG PO TABS Oral Take 25 mg by mouth every 8 (eight) hours.     [name] Kitchen HYDROCODONE-ACETAMINOPHEN 5-325 MG PO TABS Oral Take 1 tablet by mouth every 6 (six) hours as needed. For pain    . ISOSORBIDE MONONITRATE ER 60 MG PO TB24 Oral Take 60 mg by mouth daily.     [name] Kitchen MECLIZINE HCL 25 MG PO TABS Oral Take 12.5-25 mg by mouth 3 (three) times daily as needed. For  dizziness    . METOCLOPRAMIDE HCL 10 MG PO TABS Oral Take 10 mg by mouth every 6 (six) hours as needed. For indigestion    . METOPROLOL SUCCINATE ER 100 MG PO TB24 Oral Take 100 mg by mouth 2 (two) times daily.     [name] Kitchen OLMESARTAN MEDOXOMIL 40 MG PO TABS Oral Take 40 mg by mouth daily.     [name] Kitchen PANTOPRAZOLE SODIUM 40 MG PO TBEC Oral Take 40 mg by mouth daily.      [name] Kitchen POTASSIUM CHLORIDE CRYS ER 20 MEQ PO TBCR Oral Take 20 mEq by mouth daily.     [name] Kitchen ROSUVASTATIN CALCIUM 10 MG PO TABS Oral Take 10 mg by mouth daily.     . TRAMADOL HCL 50 MG PO TABS Oral Take 50 mg by mouth 2 (two) times daily as needed. For pain    . METOCLOPRAMIDE HCL 10 MG PO TABS Oral Take 1 tablet (10 mg total) by mouth every 6 (six) hours. 30 tablet 0  . NITROGLYCERIN 0.4 MG SL SUBL Sublingual Place 0.4 mg under the tongue every 5 (five) minutes as needed.      [name] Kitchen ONDANSETRON 8 MG PO TBDP Oral Take 1 tablet (8 mg total) by mouth every 8 (eight) hours as needed for nausea. 20 tablet 0    BP 155/99  Pulse 70  Temp 98 F (36.7 C) (Oral)  Resp 18  Ht 5\' 6"  (1.676 m)  Wt 342 lb (155.13 kg)  BMI 55.20 kg/m2  SpO2 99%  Physical Exam  Nursing note and vitals reviewed. GEN: Well-developed, morbidly obese female in no distress HEENT: Atraumatic, normocephalic. Oropharynx clear without erythema EYES: PERRLA BL, no scleral icterus. NECK: Trachea midline, no meningismus CV: regular rate and rhythm. No murmurs, rubs, or gallops PULM: No respiratory distress.  No crackles, wheezes, or rales. GI: soft, minimal bilateral lower quadrant tenderness to palpation.. No guarding, rebound.+ bowel sounds  GU: deferred Neuro: cranial nerves grossly 2-12 intact, no abnormalities of strength or sensation, A and O x 3 MSK: Patient moves all 4 extremities symmetrically, no deformity, edema, or injury noted Skin: No rashes petechiae, purpura, or jaundice Psych: no abnormality of mood   ED Course  Procedures (including critical care time)  Labs  Reviewed  URINALYSIS, ROUTINE W REFLEX MICROSCOPIC - Abnormal; Notable for the following:    APPearance CLOUDY (*)     All other components within normal limits  COMPREHENSIVE METABOLIC PANEL - Abnormal; Notable for the following:    Glucose, Bld 143 (*)     Creatinine, Ser 1.20 (*)     GFR calc non Af Amer 45 (*)  GFR calc Af Amer 53 (*)     All other components within normal limits  CBC WITH DIFFERENTIAL  LIPASE, BLOOD  LACTIC ACID, PLASMA   Dg Abd Acute W/chest  05/30/2012  *RADIOLOGY REPORT*  Clinical Data: Abdominal pain, nausea, vomiting, and constipation for 1 day.  ACUTE ABDOMEN SERIES (ABDOMEN 2 VIEW & CHEST 1 VIEW)  Comparison: 09/19/2011  Findings: Stable appearance of cardiac pacemaker.  Cardiac enlargement with normal pulmonary vascularity.  Stable focus of increased density at the first costochondral junction likely representing hypertrophic change.  Tortuous aorta.  No focal airspace consolidation in the lungs.  No blunting of costophrenic angles.  No pneumothorax. Stable appearance since previous chest radiograph.  Scattered gas and stool throughout the colon.  No small or large bowel distension.  No abnormal air fluid levels.  No free air.  No radiopaque stones.  Mild degenerative changes in the spine.  No significant change since previous study.  IMPRESSION: No evidence of active pulmonary disease. Cardiac enlargement. Nonobstructive bowel gas pattern.  Original Report Authenticated By: Neale Burly, M.D.     1. Abdominal pain   2. Constipation       MDM  Patient was evaluated by myself. Based on evaluation patient laboratory workup for her pain. Urinalysis was unremarkable. Patient had no leukocytosis or anemia. Liver panel and lipase were also within normal limits as well as lactate. Patient was hemodynamically stable. Her symptoms were treated with a dose of morphine, Zofran 500 cc normal saline IV bolus. Patient had some improvement of her symptoms with this.  We discussed that I thought her symptoms were likely secondary to some mild constipation. Patient has used MiraLAX in the past was comfortable with trying this at home. Patient was able to tolerate by mouth unable superior prior to discharge. She'll followup with her primary care doctor further concerns. Per family patient would like to prescription for Zofran and this was provided. Patient was not nauseous at discharge.        Chauncy Passy, MD 05/30/12 [phone]

## 2012-05-30 NOTE — ED Notes (Signed)
MD at bedside. 

## 2012-05-30 NOTE — ED Notes (Signed)
Returned from xray

## 2012-07-06 ENCOUNTER — Other Ambulatory Visit: Payer: Self-pay | Admitting: Orthopedic Surgery

## 2012-07-06 DIAGNOSIS — M545 Low back pain: Secondary | ICD-10-CM

## 2012-07-10 ENCOUNTER — Ambulatory Visit
Admission: RE | Admit: 2012-07-10 | Discharge: 2012-07-10 | Disposition: A | Discharge status: Home / Self Care | Payer: Medicare Other | Source: Ambulatory Visit | Attending: Orthopedic Surgery | Admitting: Orthopedic Surgery

## 2012-07-10 DIAGNOSIS — M545 Low back pain: Secondary | ICD-10-CM

## 2012-08-21 IMAGING — CR DG CHEST 1V PORT
1 series · 1 of 1 positions shown · non-contrast
Comparison: Chest radiograph performed 06/12/2011

CLINICAL DATA: Chest pain.

PORTABLE CHEST - 1 VIEW

[AP]
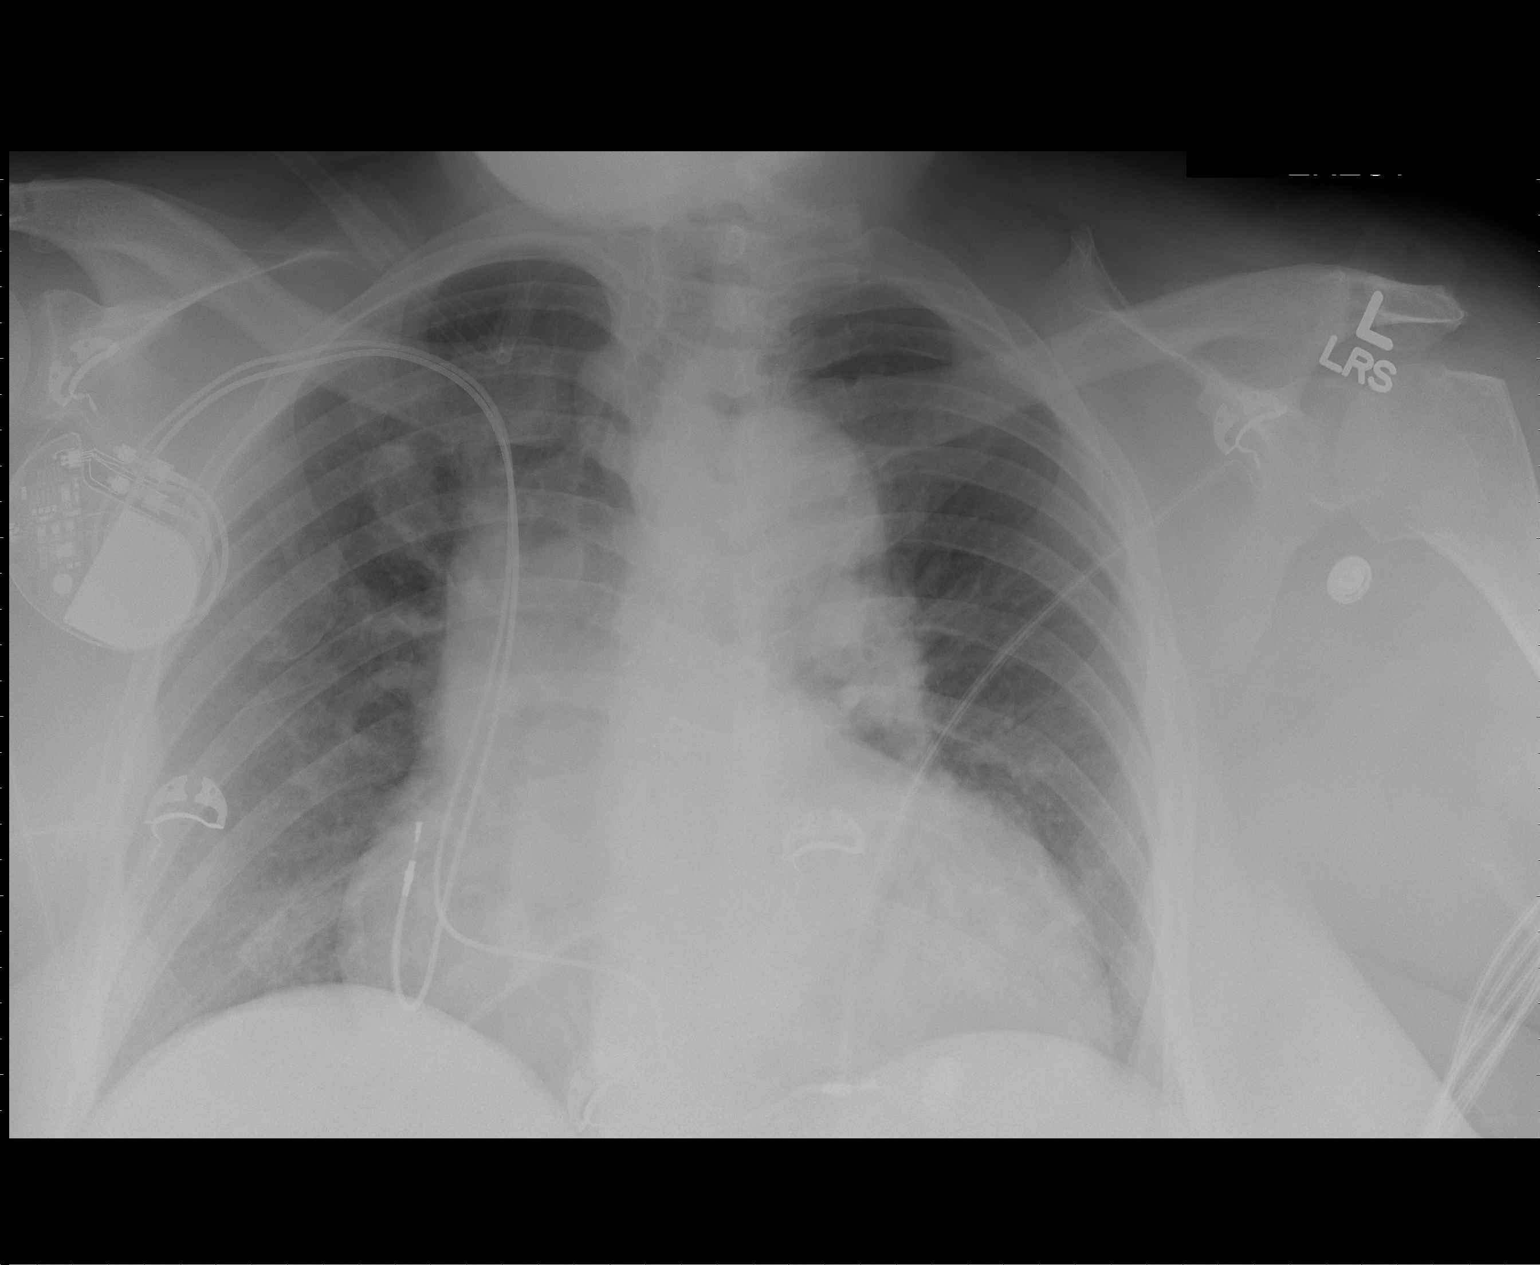

[1 of 1 positions shown; findings below may reference images not displayed]

FINDINGS: The lungs are well-aerated.  Vascular congestion is
noted, without significant pulmonary edema.  Mild left basilar
opacity likely reflects atelectasis.  There is no evidence of
pleural effusion or pneumothorax.

The cardiomediastinal silhouette is enlarged; a pacemaker is noted
overlying the right chest wall, with leads ending overlying the
right atrium and right ventricle.  No acute osseous abnormalities
are seen.
IMPRESSION: 1.  Vascular congestion and cardiomegaly, without significant
pulmonary edema.
2.  Mild left basilar opacity likely reflects atelectasis.

## 2012-09-08 ENCOUNTER — Encounter (HOSPITAL_BASED_OUTPATIENT_CLINIC_OR_DEPARTMENT_OTHER): Payer: Self-pay

## 2012-09-08 ENCOUNTER — Emergency Department (HOSPITAL_BASED_OUTPATIENT_CLINIC_OR_DEPARTMENT_OTHER): Payer: Medicare Other

## 2012-09-08 ENCOUNTER — Emergency Department (HOSPITAL_BASED_OUTPATIENT_CLINIC_OR_DEPARTMENT_OTHER)
Admission: EM | Admit: 2012-09-08 | Discharge: 2012-09-08 | Disposition: A | Discharge status: Home / Self Care | Payer: Medicare Other | Attending: Emergency Medicine | Admitting: Emergency Medicine

## 2012-09-08 DIAGNOSIS — G4730 Sleep apnea, unspecified: Secondary | ICD-10-CM | POA: Insufficient documentation

## 2012-09-08 DIAGNOSIS — I10 Essential (primary) hypertension: Secondary | ICD-10-CM | POA: Insufficient documentation

## 2012-09-08 DIAGNOSIS — K219 Gastro-esophageal reflux disease without esophagitis: Secondary | ICD-10-CM | POA: Insufficient documentation

## 2012-09-08 DIAGNOSIS — R51 Headache: Secondary | ICD-10-CM | POA: Insufficient documentation

## 2012-09-08 DIAGNOSIS — I2510 Atherosclerotic heart disease of native coronary artery without angina pectoris: Secondary | ICD-10-CM | POA: Insufficient documentation

## 2012-09-08 DIAGNOSIS — E7800 Pure hypercholesterolemia, unspecified: Secondary | ICD-10-CM | POA: Insufficient documentation

## 2012-09-08 DIAGNOSIS — I252 Old myocardial infarction: Secondary | ICD-10-CM | POA: Insufficient documentation

## 2012-09-08 DIAGNOSIS — Z8679 Personal history of other diseases of the circulatory system: Secondary | ICD-10-CM | POA: Insufficient documentation

## 2012-09-08 DIAGNOSIS — M542 Cervicalgia: Secondary | ICD-10-CM | POA: Insufficient documentation

## 2012-09-08 DIAGNOSIS — Z8701 Personal history of pneumonia (recurrent): Secondary | ICD-10-CM | POA: Insufficient documentation

## 2012-09-08 DIAGNOSIS — E785 Hyperlipidemia, unspecified: Secondary | ICD-10-CM | POA: Insufficient documentation

## 2012-09-08 DIAGNOSIS — Z8709 Personal history of other diseases of the respiratory system: Secondary | ICD-10-CM | POA: Insufficient documentation

## 2012-09-08 DIAGNOSIS — Z8673 Personal history of transient ischemic attack (TIA), and cerebral infarction without residual deficits: Secondary | ICD-10-CM | POA: Insufficient documentation

## 2012-09-08 LAB — CBC WITH DIFFERENTIAL/PLATELET
HCT: 39.4 % (ref 36.0–46.0)
Hemoglobin: 12.7 g/dL (ref 12.0–15.0)
Lymphs Abs: 2 10*3/uL (ref 0.7–4.0)
MCH: 30.6 pg (ref 26.0–34.0)
Monocytes Relative: 13 % — ABNORMAL HIGH (ref 3–12)
Neutro Abs: 2 10*3/uL (ref 1.7–7.7)
Neutrophils Relative %: 41 % — ABNORMAL LOW (ref 43–77)
RBC: 4.15 MIL/uL (ref 3.87–5.11)

## 2012-09-08 LAB — URINALYSIS, ROUTINE W REFLEX MICROSCOPIC
Ketones, ur: 15 mg/dL — AB
Leukocytes, UA: NEGATIVE
Nitrite: NEGATIVE
Protein, ur: NEGATIVE mg/dL

## 2012-09-08 LAB — COMPREHENSIVE METABOLIC PANEL
Alkaline Phosphatase: 93 U/L (ref 39–117)
BUN: 17 mg/dL (ref 6–23)
Chloride: 103 mEq/L (ref 96–112)
GFR calc Af Amer: 40 mL/min — ABNORMAL LOW (ref >90)
GFR calc non Af Amer: 35 mL/min — ABNORMAL LOW (ref >90)
Glucose, Bld: 97 mg/dL (ref 70–99)
Potassium: 4.6 mEq/L (ref 3.5–5.1)
Total Bilirubin: 0.2 mg/dL — ABNORMAL LOW (ref 0.3–1.2)

## 2012-09-08 LAB — TROPONIN I: Troponin I: <0.3 ng/mL (ref <0.30)

## 2012-09-08 NOTE — ED Notes (Signed)
Patient reports that she took extra benicar prior to coming to ED

## 2012-09-08 NOTE — ED Provider Notes (Signed)
History     CSN: [id number]  Arrival date & time 09/08/12  1956   First MD Initiated Contact with Patient 09/08/12 2015      Chief Complaint  Patient presents with  . hypertension-neck pain     (Consider location/radiation/quality/duration/timing/severity/associated sxs/prior treatment) HPI Patient states she had a headache this afternoon and took her bp and it was elevated at 170/102 not 209/102 as stated in initial nursing noted.  She states it was sharp and lasted two hours.  She has been getting similar headaches for about two weeks.  She noted crampy tightness in neck.  She took benicar and took additional isosorbide as instructed by Dr. Karlton Lemon.  Dr. Karlton Lemon told her to do that about four months ago.  She states bp with previous headaches over past two weeks and sbp has been 170 but dbp 90s so has not taken additional meds.  Symptoms included neck and shoulder tigtness. No lateralized weakness, no vision changes, chest pain, sob, or difficulty walking.  Patient states pain almost gone now.   Past Medical History  Diagnosis Date  . Coronary artery disease     Cath 04/2006 normal coronaries, mild-mod slow flow in all coronaries with slow distall runoff, mild-mod LV enlargement, EF 55%   . Hypertension   . Hyperlipemia   . Arthritis   . CHF (congestive heart failure)     05/2011 echo severe concentric LVH, normal systolic fxn, grade 1 diastolic dysfxn, paradoxical septal motion  . Vertigo   . 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 Pacemaker     For second degree HB  . Hypercholesterolemia   . MI (myocardial infarction)   . Morbid obesity   . Angina   . Shortness of breath   . Sleep apnea   . Stroke   . GERD (gastroesophageal reflux disease)   . Headache   . Dysrhythmia   . Peripheral vascular disease   . Pneumonia     Past Surgical History  Procedure Date  . Pacemaker insertion   . Pacemaker insertion   .  Vascular surgery   . Cardiac catheterization   . Dilation and curettage of uterus   . Insert / replace / remove pacemaker   . Tubal ligation     No family history on file.  History  Substance Use Topics  . Smoking status: Never Smoker   . Smokeless tobacco: Never Used  . Alcohol Use: No    OB History    Grav Para Term Preterm Abortions TAB SAB Ect Mult Living                  Review of Systems  Constitutional: Negative for fever, chills, activity change, appetite change and unexpected weight change.  HENT: Negative for sore throat, rhinorrhea, neck pain, neck stiffness and sinus pressure.   Eyes: Negative for visual disturbance.  Respiratory: Negative for cough and shortness of breath.   Cardiovascular: Negative for chest pain and leg swelling.  Gastrointestinal: Negative for vomiting, abdominal pain, diarrhea and blood in stool.  Genitourinary: Negative for dysuria, urgency, frequency, vaginal discharge and difficulty urinating.  Musculoskeletal: Negative for myalgias, arthralgias and gait problem.  Skin: Negative for color change and rash.  Neurological: Negative for weakness, light-headedness and headaches.  Hematological: Does not bruise/bleed easily.  Psychiatric/Behavioral: Negative for dysphoric mood.    Allergies  Ampicillin  Home Medications   Current Outpatient Rx  Name  Route  Sig  Dispense  Refill  . ASPIRIN 81 MG PO TABS   Oral   Take 81 mg by mouth daily.          [name] Kitchen VITAMIN D 2000 UNITS PO TABS   Oral   Take 2,000 Units by mouth daily.          [name] Kitchen CLOPIDOGREL BISULFATE 75 MG PO TABS   Oral   Take 75 mg by mouth daily.          [name] Kitchen DILTIAZEM HCL ER COATED BEADS 360 MG PO CP24   Oral   Take 360 mg by mouth daily.          [name] Kitchen DOXYCYCLINE HYCLATE 100 MG PO CAPS   Oral   Take 100 mg by mouth 2 (two) times daily.         . FUROSEMIDE 40 MG PO TABS   Oral   Take 0.5 tablets (20 mg total) by mouth daily. For fluid   31 tablet   3   .  HYDRALAZINE HCL 25 MG PO TABS   Oral   Take 25 mg by mouth every 8 (eight) hours.          [name] Kitchen HYDROCODONE-ACETAMINOPHEN 5-325 MG PO TABS   Oral   Take 1 tablet by mouth every 6 (six) hours as needed. For pain         . ISOSORBIDE MONONITRATE ER 60 MG PO TB24   Oral   Take 60 mg by mouth daily.          [name] Kitchen MECLIZINE HCL 25 MG PO TABS   Oral   Take 12.5-25 mg by mouth 3 (three) times daily as needed. For dizziness         . METOCLOPRAMIDE HCL 10 MG PO TABS   Oral   Take 1 tablet (10 mg total) by mouth every 6 (six) hours.   30 tablet   0   . METOCLOPRAMIDE HCL 10 MG PO TABS   Oral   Take 10 mg by mouth every 6 (six) hours as needed. For indigestion         . METOPROLOL SUCCINATE ER 100 MG PO TB24   Oral   Take 100 mg by mouth 2 (two) times daily.          [name] Kitchen NITROGLYCERIN 0.4 MG SL SUBL   Sublingual   Place 0.4 mg under the tongue every 5 (five) minutes as needed.           [name] Kitchen OLMESARTAN MEDOXOMIL 40 MG PO TABS   Oral   Take 40 mg by mouth daily.          [name] Kitchen PANTOPRAZOLE SODIUM 40 MG PO TBEC   Oral   Take 40 mg by mouth daily.           [name] Kitchen POTASSIUM CHLORIDE CRYS ER 20 MEQ PO TBCR   Oral   Take 20 mEq by mouth daily.          [name] Kitchen ROSUVASTATIN CALCIUM 10 MG PO TABS   Oral   Take 10 mg by mouth daily.          . TRAMADOL HCL 50 MG PO TABS   Oral   Take 50 mg by mouth 2 (two) times daily as needed. For pain           BP 137/75  Pulse 69  Temp 97.9 F (36.6 C) (Oral)  Resp 16  Ht 5\' 6"  (1.676 m)  Wt 337 lb (152.862 kg)  BMI 54.39 kg/m2  SpO2 96%  Physical Exam  Nursing note and vitals reviewed. Constitutional: She appears well-developed and well-nourished.       Obese  HENT:  Head: Normocephalic and atraumatic.  Eyes: Conjunctivae normal and EOM are normal. Pupils are equal, round, and reactive to light.  Neck: Normal range of motion. Neck supple.  Cardiovascular: Normal rate, regular rhythm, normal heart sounds and intact distal  pulses.   Pulmonary/Chest: Effort normal and breath sounds normal.  Abdominal: Soft. Bowel sounds are normal.  Musculoskeletal: Normal range of motion.  Neurological: She is alert.  Skin: Skin is warm and dry.  Psychiatric: She has a normal mood and affect. Thought content normal.    ED Course  Procedures (including critical care time)   Labs Reviewed  TROPONIN I  CBC WITH DIFFERENTIAL  COMPREHENSIVE METABOLIC PANEL   No results found.   No diagnosis found.    MDM        Patient with headache here earlier. Her headache has resolved and she took extra medication as instructed by her primary care Dr. and her blood pressure is now normal. Patient did not have any focal neurological deficits. Her head CT does not show any acute abnormalities. Patient is advised to Follow up with her pmd for bp recheck on Monday.   Shaune Pollack, MD 09/11/12 [phone]

## 2012-09-08 NOTE — ED Notes (Signed)
Patient reports that she developed headache and bilateral shoulder and neck pain this afternoon, she checked her BP and found it 209/100 pta. Taking BP medications as directed. Denies CP.

## 2012-10-17 ENCOUNTER — Inpatient Hospital Stay (HOSPITAL_COMMUNITY): Payer: Medicare Other

## 2012-10-17 ENCOUNTER — Emergency Department (HOSPITAL_BASED_OUTPATIENT_CLINIC_OR_DEPARTMENT_OTHER): Payer: Medicare Other

## 2012-10-17 ENCOUNTER — Encounter (HOSPITAL_BASED_OUTPATIENT_CLINIC_OR_DEPARTMENT_OTHER): Payer: Self-pay | Admitting: Emergency Medicine

## 2012-10-17 ENCOUNTER — Inpatient Hospital Stay (HOSPITAL_BASED_OUTPATIENT_CLINIC_OR_DEPARTMENT_OTHER)
Admission: EM | Admit: 2012-10-17 | Discharge: 2012-10-19 | DRG: 065 | Disposition: A | Discharge status: Home / Self Care | Payer: Medicare Other | Attending: Internal Medicine | Admitting: Internal Medicine

## 2012-10-17 DIAGNOSIS — I5032 Chronic diastolic (congestive) heart failure: Secondary | ICD-10-CM | POA: Diagnosis present

## 2012-10-17 DIAGNOSIS — R51 Headache: Secondary | ICD-10-CM

## 2012-10-17 DIAGNOSIS — Z950 Presence of cardiac pacemaker: Secondary | ICD-10-CM

## 2012-10-17 DIAGNOSIS — I2510 Atherosclerotic heart disease of native coronary artery without angina pectoris: Secondary | ICD-10-CM

## 2012-10-17 DIAGNOSIS — I10 Essential (primary) hypertension: Secondary | ICD-10-CM

## 2012-10-17 DIAGNOSIS — K219 Gastro-esophageal reflux disease without esophagitis: Secondary | ICD-10-CM | POA: Diagnosis present

## 2012-10-17 DIAGNOSIS — Z823 Family history of stroke: Secondary | ICD-10-CM

## 2012-10-17 DIAGNOSIS — R519 Headache, unspecified: Secondary | ICD-10-CM | POA: Insufficient documentation

## 2012-10-17 DIAGNOSIS — I509 Heart failure, unspecified: Secondary | ICD-10-CM

## 2012-10-17 DIAGNOSIS — Z6841 Body Mass Index (BMI) 40.0 and over, adult: Secondary | ICD-10-CM

## 2012-10-17 DIAGNOSIS — I6350 Cerebral infarction due to unspecified occlusion or stenosis of unspecified cerebral artery: Principal | ICD-10-CM | POA: Diagnosis present

## 2012-10-17 DIAGNOSIS — Z7902 Long term (current) use of antithrombotics/antiplatelets: Secondary | ICD-10-CM

## 2012-10-17 DIAGNOSIS — I369 Nonrheumatic tricuspid valve disorder, unspecified: Secondary | ICD-10-CM

## 2012-10-17 DIAGNOSIS — M1990 Unspecified osteoarthritis, unspecified site: Secondary | ICD-10-CM

## 2012-10-17 DIAGNOSIS — Z881 Allergy status to other antibiotic agents status: Secondary | ICD-10-CM

## 2012-10-17 DIAGNOSIS — E785 Hyperlipidemia, unspecified: Secondary | ICD-10-CM

## 2012-10-17 DIAGNOSIS — Z79899 Other long term (current) drug therapy: Secondary | ICD-10-CM

## 2012-10-17 DIAGNOSIS — I639 Cerebral infarction, unspecified: Secondary | ICD-10-CM

## 2012-10-17 DIAGNOSIS — I219 Acute myocardial infarction, unspecified: Secondary | ICD-10-CM

## 2012-10-17 DIAGNOSIS — Z7982 Long term (current) use of aspirin: Secondary | ICD-10-CM

## 2012-10-17 DIAGNOSIS — R42 Dizziness and giddiness: Secondary | ICD-10-CM

## 2012-10-17 DIAGNOSIS — Z8673 Personal history of transient ischemic attack (TIA), and cerebral infarction without residual deficits: Secondary | ICD-10-CM

## 2012-10-17 DIAGNOSIS — G4733 Obstructive sleep apnea (adult) (pediatric): Secondary | ICD-10-CM | POA: Diagnosis present

## 2012-10-17 DIAGNOSIS — I252 Old myocardial infarction: Secondary | ICD-10-CM

## 2012-10-17 DIAGNOSIS — R29898 Other symptoms and signs involving the musculoskeletal system: Secondary | ICD-10-CM

## 2012-10-17 DIAGNOSIS — G459 Transient cerebral ischemic attack, unspecified: Secondary | ICD-10-CM

## 2012-10-17 DIAGNOSIS — I739 Peripheral vascular disease, unspecified: Secondary | ICD-10-CM | POA: Diagnosis present

## 2012-10-17 LAB — URINALYSIS, ROUTINE W REFLEX MICROSCOPIC
Ketones, ur: NEGATIVE mg/dL
Leukocytes, UA: NEGATIVE
Nitrite: NEGATIVE
Protein, ur: NEGATIVE mg/dL
Urobilinogen, UA: 0.2 mg/dL (ref 0.0–1.0)
pH: 7 (ref 5.0–8.0)

## 2012-10-17 LAB — BASIC METABOLIC PANEL
Calcium: 9.2 mg/dL (ref 8.4–10.5)
Chloride: 103 mEq/L (ref 96–112)
Creatinine, Ser: 1.4 mg/dL — ABNORMAL HIGH (ref 0.50–1.10)
GFR calc Af Amer: 44 mL/min — ABNORMAL LOW (ref >90)
GFR calc non Af Amer: 38 mL/min — ABNORMAL LOW (ref >90)

## 2012-10-17 LAB — CBC WITH DIFFERENTIAL/PLATELET
Basophils Absolute: 0 10*3/uL (ref 0.0–0.1)
Basophils Relative: 1 % (ref 0–1)
HCT: 40.3 % (ref 36.0–46.0)
MCHC: 33 g/dL (ref 30.0–36.0)
Monocytes Absolute: 0.7 10*3/uL (ref 0.1–1.0)
Neutro Abs: 3.4 10*3/uL (ref 1.7–7.7)
Neutrophils Relative %: 54 % (ref 43–77)
RDW: 14 % (ref 11.5–15.5)

## 2012-10-17 LAB — CBC
Platelets: 186 10*3/uL (ref 150–400)
RBC: 4.22 MIL/uL (ref 3.87–5.11)
RDW: 14 % (ref 11.5–15.5)
WBC: 6 10*3/uL (ref 4.0–10.5)

## 2012-10-17 LAB — CREATININE, SERUM
Creatinine, Ser: 1.18 mg/dL — ABNORMAL HIGH (ref 0.50–1.10)
GFR calc Af Amer: 54 mL/min — ABNORMAL LOW (ref >90)
GFR calc non Af Amer: 46 mL/min — ABNORMAL LOW (ref >90)

## 2012-10-17 MED ORDER — PANTOPRAZOLE SODIUM 40 MG PO TBEC
40.0000 mg | DELAYED_RELEASE_TABLET | Freq: Every day | ORAL | Status: DC
  Administered 2012-10-17 – 2012-10-19 (×3): 40 mg via ORAL
  Filled 2012-10-17 (×4): qty 1

## 2012-10-17 MED ORDER — METOCLOPRAMIDE HCL 5 MG/ML IJ SOLN
10.0000 mg | Freq: Once | INTRAMUSCULAR | Status: AC
  Administered 2012-10-17: 10 mg via INTRAVENOUS
  Filled 2012-10-17: qty 2

## 2012-10-17 MED ORDER — METOPROLOL SUCCINATE ER 100 MG PO TB24
100.0000 mg | ORAL_TABLET | Freq: Two times a day (BID) | ORAL | Status: DC
  Administered 2012-10-17 – 2012-10-19 (×5): 100 mg via ORAL
  Filled 2012-10-17 (×7): qty 1

## 2012-10-17 MED ORDER — ATORVASTATIN CALCIUM 20 MG PO TABS
20.0000 mg | ORAL_TABLET | Freq: Every day | ORAL | Status: DC
  Administered 2012-10-17 – 2012-10-18 (×2): 20 mg via ORAL
  Filled 2012-10-17 (×3): qty 1

## 2012-10-17 MED ORDER — ENOXAPARIN SODIUM 30 MG/0.3ML ~~LOC~~ SOLN
30.0000 mg | Freq: Every day | SUBCUTANEOUS | Status: DC
  Administered 2012-10-17 – 2012-10-19 (×3): 30 mg via SUBCUTANEOUS
  Filled 2012-10-17 (×3): qty 0.3

## 2012-10-17 MED ORDER — MECLIZINE HCL 25 MG PO TABS
25.0000 mg | ORAL_TABLET | Freq: Once | ORAL | Status: AC
  Administered 2012-10-17: 25 mg via ORAL
  Filled 2012-10-17: qty 1

## 2012-10-17 MED ORDER — DILTIAZEM HCL ER COATED BEADS 360 MG PO CP24
360.0000 mg | ORAL_CAPSULE | Freq: Every day | ORAL | Status: DC
  Administered 2012-10-17 – 2012-10-19 (×3): 360 mg via ORAL
  Filled 2012-10-17 (×3): qty 1

## 2012-10-17 MED ORDER — TRAMADOL HCL 50 MG PO TABS
50.0000 mg | ORAL_TABLET | Freq: Two times a day (BID) | ORAL | Status: DC | PRN
  Filled 2012-10-17: qty 1

## 2012-10-17 MED ORDER — METOCLOPRAMIDE HCL 10 MG PO TABS
10.0000 mg | ORAL_TABLET | Freq: Four times a day (QID) | ORAL | Status: DC
  Administered 2012-10-17 – 2012-10-19 (×9): 10 mg via ORAL
  Filled 2012-10-17 (×14): qty 1

## 2012-10-17 MED ORDER — METOCLOPRAMIDE HCL 10 MG PO TABS
10.0000 mg | ORAL_TABLET | Freq: Four times a day (QID) | ORAL | Status: DC | PRN
  Administered 2012-10-17: 10 mg via ORAL
  Filled 2012-10-17: qty 1

## 2012-10-17 MED ORDER — HYDROCODONE-ACETAMINOPHEN 5-325 MG PO TABS: 1.0000 | ORAL_TABLET | Freq: Four times a day (QID) | ORAL | Status: DC | PRN

## 2012-10-17 MED ORDER — DIPHENHYDRAMINE HCL 50 MG/ML IJ SOLN
12.5000 mg | Freq: Once | INTRAMUSCULAR | Status: AC
  Administered 2012-10-17: 12.5 mg via INTRAVENOUS
  Filled 2012-10-17: qty 1

## 2012-10-17 MED ORDER — ISOSORBIDE MONONITRATE ER 60 MG PO TB24
60.0000 mg | ORAL_TABLET | Freq: Every day | ORAL | Status: DC
  Administered 2012-10-17 – 2012-10-19 (×3): 60 mg via ORAL
  Filled 2012-10-17 (×3): qty 1

## 2012-10-17 MED ORDER — HYDRALAZINE HCL 25 MG PO TABS
25.0000 mg | ORAL_TABLET | Freq: Three times a day (TID) | ORAL | Status: DC
  Administered 2012-10-17 – 2012-10-19 (×7): 25 mg via ORAL
  Filled 2012-10-17 (×12): qty 1

## 2012-10-17 MED ORDER — ASPIRIN 81 MG PO CHEW
81.0000 mg | CHEWABLE_TABLET | Freq: Every day | ORAL | Status: DC
  Administered 2012-10-17 – 2012-10-19 (×3): 81 mg via ORAL
  Filled 2012-10-17 (×3): qty 1

## 2012-10-17 MED ORDER — POTASSIUM CHLORIDE CRYS ER 20 MEQ PO TBCR
20.0000 meq | EXTENDED_RELEASE_TABLET | Freq: Every day | ORAL | Status: DC
  Administered 2012-10-17 – 2012-10-19 (×3): 20 meq via ORAL
  Filled 2012-10-17 (×2): qty 1

## 2012-10-17 MED ORDER — SENNOSIDES-DOCUSATE SODIUM 8.6-50 MG PO TABS: 1.0000 | ORAL_TABLET | Freq: Every evening | ORAL | Status: DC | PRN

## 2012-10-17 MED ORDER — CLOPIDOGREL BISULFATE 75 MG PO TABS
75.0000 mg | ORAL_TABLET | Freq: Every day | ORAL | Status: DC
  Administered 2012-10-17 – 2012-10-19 (×3): 75 mg via ORAL
  Filled 2012-10-17 (×4): qty 1

## 2012-10-17 MED ORDER — ASPIRIN 81 MG PO TABS: 81.0000 mg | ORAL_TABLET | Freq: Every day | ORAL | Status: DC

## 2012-10-17 NOTE — Progress Notes (Signed)
NURSING PROGRESS NOTE  [name] [id number] Admission Data: 10/17/2012 9:59 AM Attending Provider: Domenic Polite, MD [id number] S, MD Code Status: full   [name] is a 71 y.o. female patient admitted from ED  No acute distress noted.  No c/o shortness of breath, no c/o chest pain.   Blood pressure 142/90, pulse 68, temperature 98.2 F (36.8 C), temperature source Oral, resp. rate 18, SpO2 99.00%.   IV Fluids:  IV in place, occlusive dsg intact without redness, IV cath antecubital left, condition patent and no redness none.   Allergies:  Ampicillin  Past Medical History:   has a past medical history of Coronary artery disease; Hypertension; Hyperlipemia; Arthritis; CHF (congestive heart failure); Vertigo; TIA (transient ischemic attack); Pacemaker; Hypercholesterolemia; MI (myocardial infarction); Morbid obesity; Angina; Shortness of breath; Sleep apnea; Stroke; GERD (gastroesophageal reflux disease); Headache; Dysrhythmia; Peripheral vascular disease; and Pneumonia.  Past Surgical History:   has past surgical history that includes Pacemaker insertion; Pacemaker insertion; Vascular surgery; Cardiac catheterization; Dilation and curettage of uterus; Insert / replace / remove pacemaker; and Tubal ligation.  Social History:   reports that she has never smoked. She has never used smokeless tobacco. She reports that she does not drink alcohol or use illicit drugs.  Skin: intact  Orientation to room, and floor completed with information packet given to patient/family. Admission INP armband ID verified with patient/family, and in place.   SR up x 2, fall assessment complete, with patient and family able to verbalize understanding of risk associated with falls, and verbalized understanding to call for assistance before getting out of bed.   Call light within reach. Patient able to voice and demonstrate understanding of unit orientation instructions.   Awaiting  MD orders.  Madisynn Plair, Rodrigo Ran, RN

## 2012-10-17 NOTE — Progress Notes (Signed)
Received pt. From 5500; pt. Oriented to room; BP 205/110 manual upon arrival. Swallow eval performed and pt. Passed. Pt. Given all oral BP meds. Bp rechecked @1516 : 169/90; Will continue to monitor.

## 2012-10-17 NOTE — ED Notes (Signed)
[facility] has been notified of bed 5531--spoke with Marden Noble

## 2012-10-17 NOTE — H&P (Addendum)
[facility]          History and Physical    PCP: Karlton Lemon  Cardiologist: Birdie Riddle, MD   Chief Complaint:  Headache, right arm numbness  HPI: [name] is a 71 year old African American female multiple medical problems is notable for mild nonobstructive CAD, hypertension, osteoarthritis,  Peripheral vascular disease, pacemaker, dyslipidemia was in her usual state of health until 2 days ago. Patient noticed right-sided headache at 9 PM last night this morning stool workup for the headache said radiated to the right side down her neck. She subsequently took her blood pressure medicines and felt a little better and then the headache worsened again subsequently presented to the ER by EMS. In addition she also reports right arm numbness she first noticed this Sunday night,  a right arm numbness was a little better and then in the evening it worsened again. Today she still has mild numbness in her right arm but overall better than yesterday he denies any weakness or heaviness in her right upper extremity or any other extremities . She denies any recent changes in her medications, fall, trauma, seizures, fevers or chills  Upon evaluation in [facility] ER she was noted to have an unremarkable CT and labs, thenslowly transferred to [place] for further evaluation management after discussion with neurology  Allergies:   Allergies  Allergen Reactions  . Ampicillin Other (See Comments)    Increases heart rhythm       Past Medical History  Diagnosis Date  . Coronary artery disease     Cath 04/2006 normal coronaries, mild-mod slow flow in all coronaries with slow distall runoff, mild-mod LV enlargement, EF 55%   . Hypertension   . Hyperlipemia   . Arthritis   . CHF (congestive heart failure)     05/2011 echo severe concentric LVH, normal systolic fxn, grade 1 diastolic dysfxn, paradoxical septal motion  . Vertigo   . 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 Pacemaker     For second degree HB  . Hypercholesterolemia   . MI (myocardial infarction)   . Morbid obesity   . Angina   . Shortness of breath   . Sleep apnea   . Stroke   . GERD (gastroesophageal reflux disease)   . Headache   . Dysrhythmia   . Peripheral vascular disease   . Pneumonia     Past Surgical History  Procedure Date  . Pacemaker insertion   . Pacemaker insertion   . Vascular surgery   . Cardiac catheterization   . Dilation and curettage of uterus   . Insert / replace / remove pacemaker   . Tubal ligation     Prior to Admission medications   Medication Sig Start Date End Date Taking? Authorizing Provider  aspirin 81 MG tablet Take 81 mg by mouth daily.    Yes Historical Provider, MD  Cholecalciferol (VITAMIN D) 2000 UNITS tablet Take 2,000 Units by mouth daily.    Yes Historical Provider, MD  clopidogrel (PLAVIX) 75 MG tablet Take 75 mg by mouth daily.    Yes Historical Provider, MD  diltiazem (CARDIZEM CD) 360 MG 24 hr capsule Take 360 mg by mouth daily.    Yes Historical Provider, MD  furosemide (LASIX) 40 MG tablet Take 0.5 tablets (20 mg total) by mouth daily. For fluid 09/11/11  Yes Theodis Blaze, MD  hydrALAZINE (APRESOLINE) 25 MG tablet Take 25 mg by  mouth every 8 (eight) hours.    Yes Historical Provider, MD  HYDROcodone-acetaminophen (NORCO/VICODIN) 5-325 MG per tablet Take 1 tablet by mouth every 6 (six) hours as needed. For pain 09/11/11  Yes Theodis Blaze, MD  isosorbide mononitrate (IMDUR) 60 MG 24 hr tablet Take 60 mg by mouth daily.    Yes Historical Provider, MD  losartan (COZAAR) 100 MG tablet Take 100 mg by mouth daily.   Yes Historical Provider, MD  meclizine (ANTIVERT) 25 MG tablet Take 12.5-25 mg by mouth 3 (three) times daily as needed. For dizziness   Yes Historical Provider, MD  metoCLOPramide (REGLAN) 10 MG tablet Take 10 mg by mouth every 6 (six) hours as needed. For indigestion    Yes Historical Provider, MD  metoprolol (TOPROL-XL) 100 MG 24 hr tablet Take 100 mg by mouth 2 (two) times daily.    Yes Historical Provider, MD  nitroGLYCERIN (NITROSTAT) 0.4 MG SL tablet Place 0.4 mg under the tongue every 5 (five) minutes as needed.     Yes Historical Provider, MD  olmesartan (BENICAR) 40 MG tablet Take 40 mg by mouth daily.    Yes Historical Provider, MD  oxybutynin (DITROPAN) 5 MG tablet Take 5 mg by mouth 2 (two) times daily.   Yes Historical Provider, MD  pantoprazole (PROTONIX) 40 MG tablet Take 40 mg by mouth daily.     Yes Historical Provider, MD  potassium chloride SA (K-DUR,KLOR-CON) 20 MEQ tablet Take 20 mEq by mouth daily.    Yes Historical Provider, MD  rosuvastatin (CRESTOR) 10 MG tablet Take 10 mg by mouth daily.    Yes Historical Provider, MD  traMADol (ULTRAM) 50 MG tablet Take 50 mg by mouth 2 (two) times daily as needed. For pain   Yes Historical Provider, MD  metoCLOPramide (REGLAN) 10 MG tablet Take 1 tablet (10 mg total) by mouth every 6 (six) hours. [id number]  Delora Fuel, MD    Social History:  reports that she has never smoked. She has never used smokeless tobacco. She reports that she does not drink alcohol or use illicit drugs.  History reviewed. No pertinent family history.  Review of Systems:  Constitutional: Denies fever, chills, diaphoresis, appetite change and fatigue.  HEENT: Denies photophobia, eye pain, redness, hearing loss, ear pain, congestion, sore throat, rhinorrhea, sneezing, mouth sores, trouble swallowing, neck pain, neck stiffness and tinnitus.   Respiratory: Denies SOB, DOE, cough, chest tightness,  and wheezing.   Cardiovascular: Denies chest pain, palpitations and leg swelling.  Gastrointestinal: Denies nausea, vomiting, abdominal pain, diarrhea, constipation, blood in stool and abdominal distention.  Genitourinary: Denies dysuria, urgency, frequency, hematuria, flank pain and difficulty urinating.  Musculoskeletal:  Denies myalgias, back pain, joint swelling, arthralgias and gait problem.  Skin: Denies pallor, rash and wound.  Neurological: Denies dizziness, seizures, syncope, weakness, light-headedness, numbness and headaches.  Hematological: Denies adenopathy. Easy bruising, personal or family bleeding history  Psychiatric/Behavioral: Denies suicidal ideation, mood changes, confusion, nervousness, sleep disturbance and agitation   Physical Exam: Blood pressure 142/90, pulse 68, temperature 98.2 F (36.8 C), temperature source Oral, resp. rate 18, SpO2 99.00%.  general morbidly obese female laying in bed in no acute distress HEENT pupils equal reactive to light, extra ocular movements intact, no pallor or icterus noted, oral mucosa moist   neck obese unable to appreciate JVD CVS S1-S2 regular rate rhythm distant heart sounds no murmurs rubs or gallops appreciated Lungs distant breath sounds  Abdomen obese soft nontender normal bowel sounds no organomegaly Extremities  trace edema , no clubbing or cyanosis  Neuro: cranial nerves 2-12 intact Motor 5/5 in all ext, except 4+/5 in RUE Sensory light touch diminished in RUE Reflexes 1 plus b/l Plantars withdrawal   Labs on Admission:  Results for orders placed during the hospital encounter of 10/17/12 (from the past 48 hour(s))  CBC WITH DIFFERENTIAL     Status: Abnormal   Collection Time   10/17/12  5:00 AM      Component Value Range Comment   WBC 6.3  4.0 - 10.5 K/uL    RBC 4.20  3.87 - 5.11 MIL/uL    Hemoglobin 13.3  12.0 - 15.0 g/dL    HCT 40.3  36.0 - 46.0 %    MCV 96.0  78.0 - 100.0 fL    MCH 31.7  26.0 - 34.0 pg    MCHC 33.0  30.0 - 36.0 g/dL    RDW 14.0  11.5 - 15.5 %    Platelets 181  150 - 400 K/uL    Neutrophils Relative 54  43 - 77 %    Neutro Abs 3.4  1.7 - 7.7 K/uL    Lymphocytes Relative 28  12 - 46 %    Lymphs Abs 1.8  0.7 - 4.0 K/uL    Monocytes Relative 11  3 - 12 %    Monocytes Absolute 0.7  0.1 - 1.0 K/uL    Eosinophils  Relative 6 (*) 0 - 5 %    Eosinophils Absolute 0.4  0.0 - 0.7 K/uL    Basophils Relative 1  0 - 1 %    Basophils Absolute 0.0  0.0 - 0.1 K/uL   BASIC METABOLIC PANEL     Status: Abnormal   Collection Time   10/17/12  5:00 AM      Component Value Range Comment   Sodium 141  135 - 145 mEq/L    Potassium 3.6  3.5 - 5.1 mEq/L    Chloride 103  96 - 112 mEq/L    CO2 28  19 - 32 mEq/L    Glucose, Bld 125 (*) 70 - 99 mg/dL    BUN 16  6 - 23 mg/dL    Creatinine, Ser 1.40 (*) 0.50 - 1.10 mg/dL    Calcium 9.2  8.4 - 10.5 mg/dL    GFR calc non Af Amer 38 (*) >90 mL/min    GFR calc Af Amer 44 (*) >90 mL/min   TROPONIN I     Status: Normal   Collection Time   10/17/12  5:00 AM      Component Value Range Comment   Troponin I <0.30  <0.30 ng/mL   URINALYSIS, ROUTINE W REFLEX MICROSCOPIC     Status: Normal   Collection Time   10/17/12  5:18 AM      Component Value Range Comment   Color, Urine YELLOW  YELLOW    APPearance CLEAR  CLEAR    Specific Gravity, Urine 1.011  1.005 - 1.030    pH 7.0  5.0 - 8.0    Glucose, UA NEGATIVE  NEGATIVE mg/dL    Hgb urine dipstick NEGATIVE  NEGATIVE    Bilirubin Urine NEGATIVE  NEGATIVE    Ketones, ur NEGATIVE  NEGATIVE mg/dL    Protein, ur NEGATIVE  NEGATIVE mg/dL    Urobilinogen, UA 0.2  0.0 - 1.0 mg/dL    Nitrite NEGATIVE  NEGATIVE    Leukocytes, UA NEGATIVE  NEGATIVE MICROSCOPIC NOT DONE ON URINES WITH NEGATIVE PROTEIN, BLOOD,  LEUKOCYTES, NITRITE, OR GLUCOSE <1000 mg/dL.    Radiological Exams on Admission: Dg Chest 2 View  10/17/2012  *RADIOLOGY REPORT*  Clinical Data: Headache.  CHEST - 2 VIEW  Comparison: Chest radiograph performed 09/19/2011  Findings: The lungs are well-aerated and clear.  There is no evidence of focal opacification, pleural effusion or pneumothorax.  The heart is mildly enlarged; the patient's pacemaker is noted at the right chest wall, with leads ending at the right atrium and right ventricle.  No acute osseous abnormalities are  seen.  IMPRESSION: No acute cardiopulmonary process seen; mild cardiomegaly noted.   Original Report Authenticated By: Santa Lighter, M.D.    Ct Head Wo Contrast  10/17/2012  *RADIOLOGY REPORT*  Clinical Data: Severe headache, weakness and numbness.  CT HEAD WITHOUT CONTRAST  Technique:  Contiguous axial images were obtained from the base of the skull through the vertex without contrast.  Comparison: CT of the head performed 09/08/2012  Findings: There is no evidence of acute infarction, mass lesion, or intra- or extra-axial hemorrhage on CT.  Scattered periventricular and subcortical white matter change may reflect small vessel ischemic microangiopathy.  A small chronic lacunar infarct is noted along the medial right frontal lobe.  The posterior fossa, including the cerebellum, brainstem and fourth ventricle, is within normal limits.  The third and lateral ventricles, and basal ganglia are unremarkable in appearance.  No mass effect or midline shift is seen.  There is no evidence of fracture; visualized osseous structures are unremarkable in appearance.  The orbits are within normal limits. The paranasal sinuses and mastoid air cells are well-aerated.  No significant soft tissue abnormalities are seen.  IMPRESSION:  1.  No acute intracranial pathology seen on CT. 2.  Scattered small vessel ischemic microangiopathy. 3.  Small chronic lacunar infarct along the medial right frontal lobe.   Original Report Authenticated By: Santa Lighter, M.D.     Assessment/Plan  1. Headache/ right upper extremity numbness, onset of symptoms 48 hours ago, out of TPA window She is on aspirin and Plavix at home which I will continue Neurology consult requested Unable to get MRI brain due to pacemaker Will need repeat CT Check 2-D echocardiogram and carotid duplex Check lipids and hemoglobin A1c a PT OT and speech therapy eval   2. chronic diastolic CHF clinically compensated  Hold Lasix, continue metoprolol   3.  hypertension will continue Imdur and Toprol, hold ARB to allow for permissive hypertension   4. dyslipidemia continue statin   Due to prophylaxis with Lovenox  CODE STATUS full code   family communication discuss with patient and daughters at bedside Disposition inpatient   Time Spent on Admission: 34min  [facility] Pager: [phone] 10/17/2012, 11:43 AM

## 2012-10-17 NOTE — ED Notes (Signed)
Pt reports that headache has improved since onset, denies blurred vision, has history of vertigo, she reports dizziness with headache, pt did not take any of her antivert or vicodin that is prescribed to her

## 2012-10-17 NOTE — Progress Notes (Signed)
UR completed 

## 2012-10-17 NOTE — Progress Notes (Signed)
Speech Language Pathology  Patient Details Name: [name] MRN: [id number] DOB: 09-18-1941 Today's Date: 10/17/2012 Time:  -     Attempted speech-language-cognitive assessment.  This a.m. pt. out of room for testing and presently receiving ECHO.  ST may be unable to check back later today and not here on 12/25.  If pt. discharged prior to 12/26 and if there are concerns regarding speech-lang-cognition, a referral can be made for outpatient ST.  Orbie Pyo [place].Ed [employer] [phone]  10/17/2012

## 2012-10-17 NOTE — ED Notes (Signed)
Pt reports developing pain in back of head since lat last night, pt took bp medication without relief, pt did not check bp prior to taking additional medication

## 2012-10-17 NOTE — ED Provider Notes (Addendum)
History     CSN: [id number]  Arrival date & time 10/17/12  [id number]   First MD Initiated Contact with Patient 10/17/12 0431      Chief Complaint  Patient presents with  . Headache    (Consider location/radiation/quality/duration/timing/severity/associated sxs/prior treatment) Patient is a 71 y.o. female presenting with headaches and extremity weakness. The history is provided by the patient. No language interpreter was used.  Headache  This is a recurrent problem. The current episode started 6 to 12 hours ago. The problem occurs constantly. The problem has been gradually improving. The headache is associated with nothing. The pain is located in the right unilateral region. The quality of the pain is described as throbbing. The pain is moderate. The pain radiates to the right neck. Pertinent negatives include no fever, no near-syncope, no palpitations, no shortness of breath, no nausea and no vomiting. Treatments tried: bp meds. The treatment provided moderate relief.  Extremity Weakness This is a recurrent problem. The current episode started more than 2 days ago. The problem occurs constantly. The problem has not changed since onset.Associated symptoms include headaches. Pertinent negatives include no chest pain, no abdominal pain and no shortness of breath. Nothing aggravates the symptoms. Nothing relieves the symptoms. She has tried nothing for the symptoms. The treatment provided no relief.  also feels numbness in the right hand as well   Past Medical History  Diagnosis Date  . Coronary artery disease     Cath 04/2006 normal coronaries, mild-mod slow flow in all coronaries with slow distall runoff, mild-mod LV enlargement, EF 55%   . Hypertension   . Hyperlipemia   . Arthritis   . CHF (congestive heart failure)     05/2011 echo severe concentric LVH, normal systolic fxn, grade 1 diastolic dysfxn, paradoxical septal motion  . Vertigo   . 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 Pacemaker     For second degree HB  . Hypercholesterolemia   . MI (myocardial infarction)   . Morbid obesity   . Angina   . Shortness of breath   . Sleep apnea   . Stroke   . GERD (gastroesophageal reflux disease)   . Headache   . Dysrhythmia   . Peripheral vascular disease   . Pneumonia     Past Surgical History  Procedure Date  . Pacemaker insertion   . Pacemaker insertion   . Vascular surgery   . Cardiac catheterization   . Dilation and curettage of uterus   . Insert / replace / remove pacemaker   . Tubal ligation     History reviewed. No pertinent family history.  History  Substance Use Topics  . Smoking status: Never Smoker   . Smokeless tobacco: Never Used  . Alcohol Use: No    OB History    Grav Para Term Preterm Abortions TAB SAB Ect Mult Living                  Review of Systems  Constitutional: Negative for fever.  HENT: Negative for neck stiffness.   Respiratory: Negative for shortness of breath.   Cardiovascular: Negative for chest pain, palpitations and near-syncope.  Gastrointestinal: Negative for nausea, vomiting and abdominal pain.  Musculoskeletal: Positive for extremity weakness.  Neurological: Positive for weakness, numbness and headaches.  All other systems reviewed and are negative.    Allergies  Ampicillin  Home Medications   Current Outpatient Rx  Name  Route  Sig  Dispense  Refill  . ASPIRIN 81 MG PO TABS   Oral   Take 81 mg by mouth daily.          [name] Kitchen VITAMIN D 2000 UNITS PO TABS   Oral   Take 2,000 Units by mouth daily.          [name] Kitchen CLOPIDOGREL BISULFATE 75 MG PO TABS   Oral   Take 75 mg by mouth daily.          [name] Kitchen DILTIAZEM HCL ER COATED BEADS 360 MG PO CP24   Oral   Take 360 mg by mouth daily.          [name] Kitchen DOXYCYCLINE HYCLATE 100 MG PO CAPS   Oral   Take 100 mg by mouth 2 (two) times daily.         . FUROSEMIDE 40 MG PO TABS   Oral   Take 0.5  tablets (20 mg total) by mouth daily. For fluid   31 tablet   3   . HYDRALAZINE HCL 25 MG PO TABS   Oral   Take 25 mg by mouth every 8 (eight) hours.          [name] Kitchen HYDROCODONE-ACETAMINOPHEN 5-325 MG PO TABS   Oral   Take 1 tablet by mouth every 6 (six) hours as needed. For pain         . ISOSORBIDE MONONITRATE ER 60 MG PO TB24   Oral   Take 60 mg by mouth daily.          [name] Kitchen MECLIZINE HCL 25 MG PO TABS   Oral   Take 12.5-25 mg by mouth 3 (three) times daily as needed. For dizziness         . METOCLOPRAMIDE HCL 10 MG PO TABS   Oral   Take 1 tablet (10 mg total) by mouth every 6 (six) hours.   30 tablet   0   . METOCLOPRAMIDE HCL 10 MG PO TABS   Oral   Take 10 mg by mouth every 6 (six) hours as needed. For indigestion         . METOPROLOL SUCCINATE ER 100 MG PO TB24   Oral   Take 100 mg by mouth 2 (two) times daily.          [name] Kitchen NITROGLYCERIN 0.4 MG SL SUBL   Sublingual   Place 0.4 mg under the tongue every 5 (five) minutes as needed.           [name] Kitchen OLMESARTAN MEDOXOMIL 40 MG PO TABS   Oral   Take 40 mg by mouth daily.          [name] Kitchen PANTOPRAZOLE SODIUM 40 MG PO TBEC   Oral   Take 40 mg by mouth daily.           [name] Kitchen POTASSIUM CHLORIDE CRYS ER 20 MEQ PO TBCR   Oral   Take 20 mEq by mouth daily.          [name] Kitchen ROSUVASTATIN CALCIUM 10 MG PO TABS   Oral   Take 10 mg by mouth daily.          . TRAMADOL HCL 50 MG PO TABS   Oral   Take 50 mg by mouth 2 (two) times daily as needed. For pain           BP 160/86  Pulse 68  Temp 97.8 F (36.6 C) (Oral)  Resp 18  SpO2 100%  Physical Exam  Constitutional: She is oriented to person, place, and time. She appears well-developed and well-nourished. No distress.  HENT:  Head: Normocephalic and atraumatic.  Mouth/Throat: Oropharynx is clear and moist.  Eyes: EOM are normal. Pupils are equal, round, and reactive to light.  Neck: Normal range of motion. Neck supple. No JVD present. No tracheal deviation present.   Cardiovascular: Normal rate, regular rhythm and intact distal pulses.   Pulmonary/Chest: Effort normal and breath sounds normal. She has no wheezes. She has no rales.  Abdominal: Soft. Bowel sounds are normal. There is no tenderness. There is no rebound and no guarding.  Musculoskeletal: Normal range of motion. She exhibits no tenderness.  Lymphadenopathy:    She has no cervical adenopathy.  Neurological: She is alert and oriented to person, place, and time. She has normal reflexes. She displays normal reflexes. No cranial nerve deficit.       Sensation diminished in right hand 5-/5 grip strength in right dominant hand  Skin: Skin is warm and dry.  Psychiatric: She has a normal mood and affect.    ED Course  Procedures (including critical care time)  Labs Reviewed  CBC WITH DIFFERENTIAL - Abnormal; Notable for the following:    Eosinophils Relative 6 (*)     All other components within normal limits  BASIC METABOLIC PANEL  URINALYSIS, ROUTINE W REFLEX MICROSCOPIC  TROPONIN I   No results found.   No diagnosis found.    MDM   Date: 10/17/2012  Rate: 69  Rhythm: paced  QRS Axis: left  Intervals: QT prolonged  ST/T Wave abnormalities: normal  Conduction Disutrbances:none  Narrative Interpretation:   Old EKG Reviewed: unchanged  Case d/w Dr. Doy Mince of neurology, patient cannot get MRI due to her pacer.  She will need stroke work up with echo and carotids.   Outside window for neuro intervention please admit to medicine service.  Carlisle Beers, MD 10/17/12 [phone]

## 2012-10-17 NOTE — Progress Notes (Signed)
VASCULAR LAB PRELIMINARY  PRELIMINARY  PRELIMINARY  PRELIMINARY  Carotid duplex  completed.    Preliminary report:  Bilateral:  No evidence of hemodynamically significant internal carotid artery stenosis.   Vertebral artery flow is antegrade.      Pritika Alvarez, RVT 10/17/2012, 4:09 PM

## 2012-10-17 NOTE — Progress Notes (Signed)
Echocardiogram 2D Echocardiogram has been performed.  Nelli Swalley 10/17/2012, 2:37 PM

## 2012-10-17 NOTE — ED Notes (Signed)
Family reports the patient needed to use the restroom.  The patient's bed was wet upon entering the room.  Patient was able to stand and the bed was cleaned and changed.  Patient was cleaned and gown changed as well.  Patient returned to bed and placed in position of comfort; call light within reach.  Family at bedside.

## 2012-10-17 NOTE — Progress Notes (Signed)
Patient was feeling dizzi when she walked. Pt told nurse that she takes medicine everyday for vertigo. Notified [employer] social research officer. New orders received.

## 2012-10-17 NOTE — Progress Notes (Signed)
PT Note: Order received and will initiate therapy evaluation on 10/19/12. Thanks.  10/17/2012 Cyndia Bent, PT, DPT [phone]

## 2012-10-17 NOTE — Progress Notes (Addendum)
NURSING PROGRESS NOTE  [name] [id number] Transfer Data: 10/17/2012 1:05 PM Attending Provider: Domenic Polite, MD [id number], MD Code Status: full   [name] is a 71 y.o. female patient transferred to 4N29 No acute distress noted.  No c/o shortness of breath, no c/o chest pain.  Cardiac tele # [phone], in place, cardiac monitor yields:A-V paced.  Blood pressure 142/90, pulse 68, temperature 98.2 F (36.8 C), temperature source Oral, resp. rate 18, height 5\' 6"  (1.676 m), weight 156.4 kg (344 lb 12.8 oz), SpO2 99.00%.   IV Fluids:  IV in place, occlusive dsg intact without redness, IV cath antecubital left, condition patent and no redness none.   Allergies:  Ampicillin  Past Medical History:   has a past medical history of Coronary artery disease; Hypertension; Hyperlipemia; Arthritis; CHF (congestive heart failure); Vertigo; TIA (transient ischemic attack); Pacemaker; Hypercholesterolemia; MI (myocardial infarction); Morbid obesity; Angina; Shortness of breath; Sleep apnea; Stroke; GERD (gastroesophageal reflux disease); Headache; Dysrhythmia; Peripheral vascular disease; and Pneumonia.  Past Surgical History:   has past surgical history that includes Pacemaker insertion; Pacemaker insertion; Vascular surgery; Cardiac catheterization; Dilation and curettage of uterus; Insert / replace / remove pacemaker; and Tubal ligation.  Social History:   reports that she has never smoked. She has never used smokeless tobacco. She reports that she does not drink alcohol or use illicit drugs.  Skin: Intact  Report called to 4N. Transferred patient via [place] and [facility]. No complication during transportation.   [name], Rodrigo Ran, RN

## 2012-10-17 NOTE — Consult Note (Signed)
Referring Physician: Dr. Broadus John    Chief Complaint: right headache, right arm weakness, right diplopia  HPI: [name] is an 71 y.o. female who developed right eye diplopia on 10/15/2012, then right sided headache on 10/16/2012 at 1500 followed by right face, neck and arm pain and right arm/leg weakness. She continues to have these same symptoms although she reports that the diplopia is intermittent.  It is not distance related.    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  Date last known well: 10/16/2012 Time last known well: Unable to determine  tPA Given: No: out of window  Past Medical History  Diagnosis Date  . Coronary artery disease     Cath 04/2006 normal coronaries, mild-mod slow flow in all coronaries with slow distall runoff, mild-mod LV enlargement, EF 55%   . Hypertension   . Hyperlipemia   . Arthritis   . CHF (congestive heart failure)     05/2011 echo severe concentric LVH, normal systolic fxn, grade 1 diastolic dysfxn, paradoxical septal motion  . Vertigo   . 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 Pacemaker     For second degree HB  . Hypercholesterolemia   . MI (myocardial infarction)   . Morbid obesity   . Angina   . Shortness of breath   . Sleep apnea   . Stroke   . GERD (gastroesophageal reflux disease)   . Headache   . Dysrhythmia   . Peripheral vascular disease   . Pneumonia     Past Surgical History  Procedure Date  . Pacemaker insertion   . Pacemaker insertion   . Vascular surgery   . Cardiac catheterization   . Dilation and curettage of uterus   . Insert / replace / remove pacemaker   . Tubal ligation    Family history:  Mom died secondary to stroke  Brother died at age 43 secondary to brain aneurysm  Social History: she denies any tobacco, alcohol or illicit drug use.  Allergies:  Allergies  Allergen Reactions  .  Ampicillin Other (See Comments)    Increases heart rhythm    Current Facility-Administered Medications  Medication Dose Route Frequency Provider Last Rate Last Dose  . aspirin chewable tablet 81 mg  81 mg Oral Daily Domenic Polite, MD   81 mg at 10/17/12 1356  . atorvastatin (LIPITOR) tablet 20 mg  20 mg Oral q1800 Domenic Polite, MD      . clopidogrel (PLAVIX) tablet 75 mg  75 mg Oral Q breakfast Domenic Polite, MD   75 mg at 10/17/12 1358  . diltiazem (CARDIZEM CD) 24 hr capsule 360 mg  360 mg Oral Daily Domenic Polite, MD   360 mg at 10/17/12 1357  . enoxaparin (LOVENOX) injection 30 mg  30 mg Subcutaneous Daily Domenic Polite, MD   30 mg at 10/17/12 1309  . hydrALAZINE (APRESOLINE) tablet 25 mg  25 mg Oral Q8H Domenic Polite, MD   25 mg at 10/17/12 1357  . HYDROcodone-acetaminophen (NORCO/VICODIN) 5-325 MG per tablet 1 tablet  1 tablet Oral Q6H PRN Domenic Polite, MD      . isosorbide mononitrate (IMDUR) 24 hr tablet 60 mg  60 mg Oral Daily Domenic Polite, MD   60 mg at 10/17/12 1357  . metoCLOPramide (REGLAN) tablet 10 mg  10 mg Oral Q6H Domenic Polite, MD   10 mg at 10/17/12 1357  . metoCLOPramide (REGLAN)  tablet 10 mg  10 mg Oral Q6H PRN Domenic Polite, MD      . metoprolol succinate (TOPROL-XL) 24 hr tablet 100 mg  100 mg Oral BID Domenic Polite, MD   100 mg at 10/17/12 1357  . pantoprazole (PROTONIX) EC tablet 40 mg  40 mg Oral Q1200 Domenic Polite, MD   40 mg at 10/17/12 1356  . potassium chloride SA (K-DUR,KLOR-CON) CR tablet 20 mEq  20 mEq Oral Daily Domenic Polite, MD      . senna-docusate (Senokot-S) tablet 1 tablet  1 tablet Oral QHS PRN Domenic Polite, MD      . traMADol Veatrice Bourbon) tablet 50 mg  50 mg Oral BID PRN Domenic Polite, MD        ROS: History obtained from the patient  General ROS: negative for - chills, fatigue, fever, night sweats, weight gain or weight loss Psychological ROS: negative for - behavioral disorder, hallucinations, memory difficulties, mood swings or  suicidal ideation Ophthalmic ROS: negative for eye pain or loss of vision, + right eye blurred/double vision ENT ROS: negative for - epistaxis, nasal discharge, oral lesions, sore throat, tinnitus or vertigo Allergy and Immunology ROS: negative for - hives or itchy/watery eyes Hematological and Lymphatic ROS: negative for - bleeding problems, bruising or swollen lymph nodes Endocrine ROS: negative for - galactorrhea, hair pattern changes, polydipsia/polyuria or temperature intolerance Respiratory ROS: negative for - cough, hemoptysis, shortness of breath or wheezing Cardiovascular ROS: negative for - chest pain, dyspnea on exertion, edema or irregular heartbeat Gastrointestinal ROS: negative for - abdominal pain, diarrhea, hematemesis, nausea/vomiting or stool incontinence Genito-Urinary ROS: negative for - dysuria, hematuria, urinary frequency/urgency, +urinary incontinence Musculoskeletal ROS: negative for - joint swelling or muscular weakness Neurological ROS: as noted in HPI Dermatological ROS: negative for rash and skin lesion changes    Physical Examination: Blood pressure 204/100, pulse 68, temperature 98 F (36.7 C), temperature source Oral, resp. rate 17, height 5\' 6"  (1.676 m), weight 156.4 kg (344 lb 12.8 oz), SpO2 96.00%.  Neurologic Examination: Mental Status: Alert, oriented, thought content appropriate.  Speech fluent without evidence of aphasia.  Able to follow 3 step commands without difficulty. Cranial Nerves: II: Discs flat bilaterally; visual fields grossly normal, pupils equal, round, reactive to light and accommodation III,IV, VI: extra-ocular motions intact bilaterally, no diplopia V,VII: decrease in left NLF, facial light touch sensation decreased on the right VIII: hearing normal bilaterally IX,X: gag reflex present XI: trapezius strength/neck flexion strength normal bilaterally XII: tongue strength normal  Motor: Right : Upper extremity   5-/5, no  drift    Left:     Upper extremity   5/5  Lower extremity   4+/5      Lower extremity   5/5 Tone and bulk:normal tone throughout; no atrophy noted Sensory: Pinprick and light touch decreased right upper extremity and right lower extremity.  Pain in the same distribution with significant pain to palpation at the right shoulder  Deep Tendon Reflexes: 2+ in the upper extremities, 1+ at the knees and absent at the ankles Plantars: Right: downgoing   Left: downgoing Cerebellar: normal finger-to-nose    Results for orders placed during the hospital encounter of 10/17/12 (from the past 48 hour(s))  CBC WITH DIFFERENTIAL     Status: Abnormal   Collection Time   10/17/12  5:00 AM      Component Value Range Comment   WBC 6.3  4.0 - 10.5 K/uL    RBC 4.20  3.87 - 5.11 MIL/uL  Hemoglobin 13.3  12.0 - 15.0 g/dL    HCT 40.3  36.0 - 46.0 %    MCV 96.0  78.0 - 100.0 fL    MCH 31.7  26.0 - 34.0 pg    MCHC 33.0  30.0 - 36.0 g/dL    RDW 14.0  11.5 - 15.5 %    Platelets 181  150 - 400 K/uL    Neutrophils Relative 54  43 - 77 %    Neutro Abs 3.4  1.7 - 7.7 K/uL    Lymphocytes Relative 28  12 - 46 %    Lymphs Abs 1.8  0.7 - 4.0 K/uL    Monocytes Relative 11  3 - 12 %    Monocytes Absolute 0.7  0.1 - 1.0 K/uL    Eosinophils Relative 6 (*) 0 - 5 %    Eosinophils Absolute 0.4  0.0 - 0.7 K/uL    Basophils Relative 1  0 - 1 %    Basophils Absolute 0.0  0.0 - 0.1 K/uL   BASIC METABOLIC PANEL     Status: Abnormal   Collection Time   10/17/12  5:00 AM      Component Value Range Comment   Sodium 141  135 - 145 mEq/L    Potassium 3.6  3.5 - 5.1 mEq/L    Chloride 103  96 - 112 mEq/L    CO2 28  19 - 32 mEq/L    Glucose, Bld 125 (*) 70 - 99 mg/dL    BUN 16  6 - 23 mg/dL    Creatinine, Ser 1.40 (*) 0.50 - 1.10 mg/dL    Calcium 9.2  8.4 - 10.5 mg/dL    GFR calc non Af Amer 38 (*) >90 mL/min    GFR calc Af Amer 44 (*) >90 mL/min   TROPONIN I     Status: Normal   Collection Time   10/17/12  5:00 AM       Component Value Range Comment   Troponin I <0.30  <0.30 ng/mL   URINALYSIS, ROUTINE W REFLEX MICROSCOPIC     Status: Normal   Collection Time   10/17/12  5:18 AM      Component Value Range Comment   Color, Urine YELLOW  YELLOW    APPearance CLEAR  CLEAR    Specific Gravity, Urine 1.011  1.005 - 1.030    pH 7.0  5.0 - 8.0    Glucose, UA NEGATIVE  NEGATIVE mg/dL    Hgb urine dipstick NEGATIVE  NEGATIVE    Bilirubin Urine NEGATIVE  NEGATIVE    Ketones, ur NEGATIVE  NEGATIVE mg/dL    Protein, ur NEGATIVE  NEGATIVE mg/dL    Urobilinogen, UA 0.2  0.0 - 1.0 mg/dL    Nitrite NEGATIVE  NEGATIVE    Leukocytes, UA NEGATIVE  NEGATIVE MICROSCOPIC NOT DONE ON URINES WITH NEGATIVE PROTEIN, BLOOD, LEUKOCYTES, NITRITE, OR GLUCOSE <1000 mg/dL.  CBC     Status: Normal   Collection Time   10/17/12 11:31 AM      Component Value Range Comment   WBC 6.0  4.0 - 10.5 K/uL    RBC 4.22  3.87 - 5.11 MIL/uL    Hemoglobin 12.9  12.0 - 15.0 g/dL    HCT 40.0  36.0 - 46.0 %    MCV 94.8  78.0 - 100.0 fL    MCH 30.6  26.0 - 34.0 pg    MCHC 32.3  30.0 - 36.0 g/dL    RDW 14.0  11.5 - 15.5 %    Platelets 186  150 - 400 K/uL   CREATININE, SERUM     Status: Abnormal   Collection Time   10/17/12 11:31 AM      Component Value Range Comment   Creatinine, Ser 1.18 (*) 0.50 - 1.10 mg/dL    GFR calc non Af Amer 46 (*) >90 mL/min    GFR calc Af Amer 54 (*) >90 mL/min     IR Angiogram Carotid Cerebral 06/16/2011 (consult for vertigo at that time) 1. Angiographically occluded hypoplastic left vertebral artery with attempts at reconstitution distally at the level of C1-C2, without distal opacification. 2. Dominant posterior communicating arteries bilaterally, with retrograde opacification of the distal one third of the basilar artery.  Dg Chest 2 View 10/17/2012  *RADIOLOGY REPORT*  Clinical Data: Shortness of breath and chest pain  CHEST - 2 VIEW  Comparison: Study obtained earlier in the day.  Findings: The lungs are  clear.  There is mild cardiac enlargement, stable.  Aorta is tortuous but stable.  Pacemaker lead tips are in the right atrium right ventricle.  No adenopathy.  There is degenerative change in the thoracic spine. No pneumothorax.  IMPRESSION: Stable cardiomegaly.  Stable aortic tortuosity.  No edema or consolidation.   Original Report Authenticated By: Lowella Grip, M.D.    Dg Chest 2 View 10/17/2012  *RADIOLOGY REPORT*  Clinical Data: Headache.  CHEST - 2 VIEW  Comparison: Chest radiograph performed 09/19/2011  Findings: The lungs are well-aerated and clear.  There is no evidence of focal opacification, pleural effusion or pneumothorax.  The heart is mildly enlarged; the patient's pacemaker is noted at the right chest wall, with leads ending at the right atrium and right ventricle.  No acute osseous abnormalities are seen.  IMPRESSION: No acute cardiopulmonary process seen; mild cardiomegaly noted.   Original Report Authenticated By: Santa Lighter, M.D.    Ct Head Wo Contrast 10/17/2012  *RADIOLOGY REPORT*  Clinical Data: Severe headache, weakness and numbness.  CT HEAD WITHOUT CONTRAST  Technique:  Contiguous axial images were obtained from the base of the skull through the vertex without contrast.  Comparison: CT of the head performed 09/08/2012  Findings: There is no evidence of acute infarction, mass lesion, or intra- or extra-axial hemorrhage on CT.  Scattered periventricular and subcortical white matter change may reflect small vessel ischemic microangiopathy.  A small chronic lacunar infarct is noted along the medial right frontal lobe.  The posterior fossa, including the cerebellum, brainstem and fourth ventricle, is within normal limits.  The third and lateral ventricles, and basal ganglia are unremarkable in appearance.  No mass effect or midline shift is seen.  There is no evidence of fracture; visualized osseous structures are unremarkable in appearance.  The orbits are within normal limits.  The paranasal sinuses and mastoid air cells are well-aerated.  No significant soft tissue abnormalities are seen.  IMPRESSION:  1.  No acute intracranial pathology seen on CT. 2.  Scattered small vessel ischemic microangiopathy. 3.  Small chronic lacunar infarct along the medial right frontal lobe.   Original Report Authenticated By: Santa Lighter, M.D.     Wayland Denis, MBA, MHA [facility] Pager [phone]   Patient seen and examined.  Clinical course and management discussed.  Necessary edits performed.  I agree with the above.  Assessment and plan of care developed and discussed below.    Assessment: 71 y.o. female with right sided pain and weakness along with intermittent diplopia.  Head CT unremarkable.  Patient with risk factors for small vessel disease but can not have MRI of the brain secondary to pacemaker.  On ASA and Plavix at home.    Stroke Risk Factors - family history, hyperlipidemia, hypertension and peripheral vascular disease  Plan: 1. HgbA1c, fasting lipid panel 2. Repeat head CT in 2 days 3. PT consult, OT consult, Speech consult 4. Echocardiogram 5. Carotid dopplers 6. Prophylactic therapy-Continue ASA and Plavix 7. Risk factor modification 8. Telemetry monitoring 9. Frequent neuro checks   Alexis Goodell, MD [facility] [phone]  10/17/2012  3:49 PM

## 2012-10-18 ENCOUNTER — Inpatient Hospital Stay (HOSPITAL_COMMUNITY): Payer: Medicare Other

## 2012-10-18 DIAGNOSIS — I6350 Cerebral infarction due to unspecified occlusion or stenosis of unspecified cerebral artery: Principal | ICD-10-CM

## 2012-10-18 DIAGNOSIS — I2510 Atherosclerotic heart disease of native coronary artery without angina pectoris: Secondary | ICD-10-CM

## 2012-10-18 DIAGNOSIS — R51 Headache: Secondary | ICD-10-CM

## 2012-10-18 DIAGNOSIS — R42 Dizziness and giddiness: Secondary | ICD-10-CM

## 2012-10-18 DIAGNOSIS — G459 Transient cerebral ischemic attack, unspecified: Secondary | ICD-10-CM

## 2012-10-18 DIAGNOSIS — I509 Heart failure, unspecified: Secondary | ICD-10-CM

## 2012-10-18 DIAGNOSIS — E785 Hyperlipidemia, unspecified: Secondary | ICD-10-CM

## 2012-10-18 DIAGNOSIS — I10 Essential (primary) hypertension: Secondary | ICD-10-CM

## 2012-10-18 LAB — HEMOGLOBIN A1C: Hgb A1c MFr Bld: 5.8 % — ABNORMAL HIGH (ref <5.7)

## 2012-10-18 LAB — LIPID PANEL
LDL Cholesterol: 68 mg/dL (ref 0–99)
Triglycerides: 88 mg/dL (ref <150)
VLDL: 18 mg/dL (ref 0–40)

## 2012-10-18 MED ORDER — IOHEXOL 350 MG/ML SOLN
80.0000 mL | Freq: Once | INTRAVENOUS | Status: AC | PRN
  Administered 2012-10-18: 80 mL via INTRAVENOUS

## 2012-10-18 MED ORDER — MECLIZINE HCL 12.5 MG PO TABS
12.5000 mg | ORAL_TABLET | Freq: Three times a day (TID) | ORAL | Status: DC | PRN
  Filled 2012-10-18 (×2): qty 1

## 2012-10-18 NOTE — Progress Notes (Signed)
Stroke Team Progress Note  HISTORY [name] is an 71 y.o. female who developed right eye diplopia on 10/15/2012, then right sided headache on 10/16/2012 at 1500 followed by right face, neck and arm pain and right arm/leg weakness. She continues to have these same symptoms although she reports that the diplopia is intermittent. It is not distance related.   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  Patient was not a TPA candidate secondary to delay in arrival. She was admitted for further evaluation and treatment.  SUBJECTIVE Her daughter is at the bedside.  Overall she feels her condition is unchanged. Patient lives alone. Daughter from [place] visiting and staying with her for the next 2 wks.  OBJECTIVE Most recent Vital Signs: Filed Vitals:   10/17/12 2141 10/17/12 2318 10/18/12 0059 10/18/12 0609  BP: 126/74 152/71 136/88 131/69  Pulse: 71 71 74 77  Temp: 98 F (36.7 C) 98.1 F (36.7 C) 98.1 F (36.7 C) 98.2 F (36.8 C)  TempSrc: Oral Oral Oral Oral  Resp: 18 18 18 18   Height:      Weight:      SpO2: 100%  97% 97%   CBG (last 3)  No results found for this basename: GLUCAP:3 in the last 72 hours  IV Fluid Intake:     MEDICATIONS    . aspirin  81 mg Oral Daily  . atorvastatin  20 mg Oral q1800  . clopidogrel  75 mg Oral Q breakfast  . diltiazem  360 mg Oral Daily  . enoxaparin (LOVENOX) injection  30 mg Subcutaneous Daily  . hydrALAZINE  25 mg Oral Q8H  . isosorbide mononitrate  60 mg Oral Daily  . metoCLOPramide  10 mg Oral Q6H  . metoprolol succinate  100 mg Oral BID  . pantoprazole  40 mg Oral Q1200  . potassium chloride SA  20 mEq Oral Daily   PRN:  HYDROcodone-acetaminophen, meclizine, metoCLOPramide, senna-docusate, traMADol  Diet:  Cardiac thin liquids Activity:  OOB with assistance DVT Prophylaxis:  Lovenox 30 mg sq daily   CLINICALLY SIGNIFICANT STUDIES Basic Metabolic Panel:  Lab [id number]  1131 10/17/12 0500  NA -- 141  K -- 3.6  CL -- 103  CO2 -- 28  GLUCOSE -- 125*  BUN -- 16  CREATININE 1.18* 1.40*  CALCIUM -- 9.2  MG -- --  PHOS -- --   Liver Function Tests: No results found for this basename: AST:2,ALT:2,ALKPHOS:2,BILITOT:2,PROT:2,ALBUMIN:2 in the last 168 hours CBC:  Lab 10/17/12 1131 10/17/12 0500  WBC 6.0 6.3  NEUTROABS -- 3.4  HGB 12.9 13.3  HCT 40.0 40.3  MCV 94.8 96.0  PLT 186 181   Coagulation: No results found for this basename: LABPROT:4,INR:4 in the last 168 hours Cardiac Enzymes:  Lab 10/17/12 0500  CKTOTAL --  CKMB --  CKMBINDEX --  TROPONINI <0.30   Urinalysis:  Lab 10/17/12 0518  COLORURINE YELLOW  LABSPEC 1.011  PHURINE 7.0  GLUCOSEU NEGATIVE  HGBUR NEGATIVE  BILIRUBINUR NEGATIVE  KETONESUR NEGATIVE  PROTEINUR NEGATIVE  UROBILINOGEN 0.2  NITRITE NEGATIVE  LEUKOCYTESUR NEGATIVE   Lipid Panel    Component Value Date/Time   CHOL 131 10/18/2012 0500   TRIG 88 10/18/2012 0500   HDL 45 10/18/2012 0500   CHOLHDL 2.9 10/18/2012 0500   VLDL 18 10/18/2012 0500   LDLCALC 68 10/18/2012 0500   HgbA1C  Lab Results  Component Value Date   HGBA1C 6.0* 09/09/2011    Urine Drug Screen:  No results found for this basename: labopia, cocainscrnur, labbenz, amphetmu, thcu, labbarb    Alcohol Level: No results found for this basename: ETH:2 in the last 168 hours  CT of the brain  10/17/2012  1.  No acute intracranial pathology seen on CT. 2.  Scattered small vessel ischemic microangiopathy. 3.  Small chronic lacunar infarct along the medial right frontal lobe.    MRI of the brain  pacemaker  MRA of the brain  pacemaker  2D Echocardiogram    Carotid Doppler  No evidence of hemodynamically significant internal carotid artery stenosis. Vertebral artery flow is antegrade.   CXR   10/17/2012 Stable cardiomegaly.  Stable aortic tortuosity.  No edema or consolidation.    10/17/2012   No acute cardiopulmonary process seen; mild  cardiomegaly noted.     EKG  AV dual paced rhythm.   Therapy Recommendations   Physical Exam   GENERAL EXAM: Patient is in no distress  CARDIOVASCULAR: Regular rate and rhythm, no murmurs, no carotid bruits  NEUROLOGIC: MENTAL STATUS: awake, alert, language fluent, comprehension intact, naming intact CRANIAL NERVE: pupils equal and reactive to light, visual fields full to confrontation, RIGHT EYE EXOTROPIA/EXOPHORIA. SUBTLE RIGHT PTOSIS. Extraocular muscles intact, no nystagmus, DECR RIGHT FACIAL SENSATION. Facial strength symmetric, uvula midline, shoulder shrug symmetric, tongue midline. MOTOR: normal bulk and tone, full strength in the BUE, BLE SENSORY: RIGHT ARM AND LEG DECR SENSATION. [name] Kitchen COORDINATION: finger-nose-finger, fine finger movements, heel-shin normal REFLEXES: deep tendon reflexes present and symmetric GAIT/STATION: SITTING IN CHAIR. EATING LUNCH.   ASSESSMENT [name] is a 71 y.o. female presenting Monday night at [place] with intermittent right eye diplopia for seconds at a time  followed by headache with right face, neck and arm pain, numbness, then right arm and leg weakness. Suspect left brain infarct. Imaging pending. Work up underway. On aspirin 81 mg orally every day and clopidogrel 75 mg orally every day prior to admission. Now on aspirin 81 mg orally every day and clopidogrel 75 mg orally every day for secondary stroke prevention. Patient with resultant right hemiparesis, sensory deficit, diplopia..   Malignant hypertension, BP 205/110  Morbid obesity, Body mass index is 55.65 kg/(m^2).  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  CT angio of head and neck  F/u 2D  Therapy evals (currently getting PT at home)  ongoing BP control  Darlene BIBY, MSN, RN, ANVP-BC, ANP-BC, GNP-BC [facility] Pager: [phone] 10/18/2012 9:24 AM  I have personally obtained a history, examined the patient, evaluated imaging results, and formulated the assessment and plan of care. I agree with the above.   [facility] - Stroke Team Andrey Spearman, MD 10/18/2012 9:24 AM  Please refer to [email or web address] for on-call Stroke MD

## 2012-10-18 NOTE — Progress Notes (Signed)
[name]  female  [id number]    DOB: [birth year]    DOA: 10/17/2012  PCP: Birdie Riddle, MD  Assessment/Plan: Principal Problem:  *CVA (cerebral infarction) - CT head done on 10/17/2012 showed no acute intracranial pathology - Unable to do MRI due to pacer, per neurology, repeat CT head tomorrow - 2-D echo done, results pending - Carotid Doppler shows no evidence of hemodynamically significant internal carotid artery stenosis - PTOT evaluation - On aspirin and Plavix, statin  Active Problems:  Coronary artery disease: Stable, no chest pain - On aspirin, Plavix, BB, hydralazine, statins, Imdur   Hypertension: BP controlled  CHF (congestive heart failure): Currently compensated   Vertigo: Place her back on meclizine on home dose   Morbid obesity: Counseled on diet control and weight loss  DVT Prophylaxis:  Code Status: FC  Disposition: hopefully in AM once stroke w/u is complete    Subjective: Feeling dizzy today states she takes meclizine at home  Objective: Weight change:  No intake or output data in the 24 hours ending 10/18/12 1141 Blood pressure 131/69, pulse 77, temperature 98.2 F (36.8 C), temperature source Oral, resp. rate 18, height 5\' 6"  (1.676 m), weight 156.4 kg (344 lb 12.8 oz), SpO2 97.00%.  Physical Exam: General: Alert and awake, oriented x3, not in any acute distress. HEENT: anicteric sclera, pupils reactive to light and accommodation, EOMI CVS: S1-S2 clear, no murmur rubs or gallops Chest: CTAB Abdomen: soft, NT, ND,NBS Extremities: no cyanosis, clubbing or edema noted bilaterally   Lab Results: Basic Metabolic Panel:  Lab [id number] 1131 10/17/12 0500  NA -- 141  K -- 3.6  CL -- 103  CO2 -- 28  GLUCOSE -- 125*  BUN -- 16  CREATININE 1.18* 1.40*  CALCIUM -- 9.2  MG -- --  PHOS -- --   Liver Function Tests: No results found for this basename: AST:2,ALT:2,ALKPHOS:2,BILITOT:2,PROT:2,ALBUMIN:2 in the last 168  hours No results found for this basename: LIPASE:2,AMYLASE:2 in the last 168 hours No results found for this basename: AMMONIA:2 in the last 168 hours CBC:  Lab 10/17/12 1131 10/17/12 0500  WBC 6.0 6.3  NEUTROABS -- 3.4  HGB 12.9 13.3  HCT 40.0 40.3  MCV 94.8 96.0  PLT 186 181   Cardiac Enzymes:  Lab 10/17/12 0500  CKTOTAL --  CKMB --  CKMBINDEX --  TROPONINI <0.30   BNP: No components found with this basename: POCBNP:2 CBG: No results found for this basename: GLUCAP:5 in the last 168 hours   Micro Results: No results found for this or any previous visit (from the past 240 hour(s)).  Studies/Results: Dg Chest 2 View  10/17/2012  *RADIOLOGY REPORT*  Clinical Data: Shortness of breath and chest pain  CHEST - 2 VIEW  Comparison: Study obtained earlier in the day.  Findings: The lungs are clear.  There is mild cardiac enlargement, stable.  Aorta is tortuous but stable.  Pacemaker lead tips are in the right atrium right ventricle.  No adenopathy.  There is degenerative change in the thoracic spine. No pneumothorax.  IMPRESSION: Stable cardiomegaly.  Stable aortic tortuosity.  No edema or consolidation.   Original Report Authenticated By: Lowella Grip, M.D.    Dg Chest 2 View  10/17/2012  *RADIOLOGY REPORT*  Clinical Data: Headache.  CHEST - 2 VIEW  Comparison: Chest radiograph performed 09/19/2011  Findings: The lungs are well-aerated and clear.  There is no evidence of focal opacification, pleural effusion or pneumothorax.  The heart is mildly enlarged;  the patient's pacemaker is noted at the right chest wall, with leads ending at the right atrium and right ventricle.  No acute osseous abnormalities are seen.  IMPRESSION: No acute cardiopulmonary process seen; mild cardiomegaly noted.   Original Report Authenticated By: Santa Lighter, M.D.    Ct Head Wo Contrast  10/17/2012  *RADIOLOGY REPORT*  Clinical Data: Severe headache, weakness and numbness.  CT HEAD WITHOUT CONTRAST   Technique:  Contiguous axial images were obtained from the base of the skull through the vertex without contrast.  Comparison: CT of the head performed 09/08/2012  Findings: There is no evidence of acute infarction, mass lesion, or intra- or extra-axial hemorrhage on CT.  Scattered periventricular and subcortical white matter change may reflect small vessel ischemic microangiopathy.  A small chronic lacunar infarct is noted along the medial right frontal lobe.  The posterior fossa, including the cerebellum, brainstem and fourth ventricle, is within normal limits.  The third and lateral ventricles, and basal ganglia are unremarkable in appearance.  No mass effect or midline shift is seen.  There is no evidence of fracture; visualized osseous structures are unremarkable in appearance.  The orbits are within normal limits. The paranasal sinuses and mastoid air cells are well-aerated.  No significant soft tissue abnormalities are seen.  IMPRESSION:  1.  No acute intracranial pathology seen on CT. 2.  Scattered small vessel ischemic microangiopathy. 3.  Small chronic lacunar infarct along the medial right frontal lobe.   Original Report Authenticated By: Santa Lighter, M.D.     Medications: Scheduled Meds:   . aspirin  81 mg Oral Daily  . atorvastatin  20 mg Oral q1800  . clopidogrel  75 mg Oral Q breakfast  . diltiazem  360 mg Oral Daily  . enoxaparin (LOVENOX) injection  30 mg Subcutaneous Daily  . hydrALAZINE  25 mg Oral Q8H  . isosorbide mononitrate  60 mg Oral Daily  . metoCLOPramide  10 mg Oral Q6H  . metoprolol succinate  100 mg Oral BID  . pantoprazole  40 mg Oral Q1200  . potassium chloride SA  20 mEq Oral Daily      LOS: 1 day   Davien Malone M.D. [facility] 10/18/2012, 11:41 AM Pager: [phone]  If 7PM-7AM, please contact night-coverage [email or web address] Password [email or web address]

## 2012-10-19 DIAGNOSIS — M6281 Muscle weakness (generalized): Secondary | ICD-10-CM

## 2012-10-19 MED ORDER — ASPIRIN 81 MG PO CHEW: 81.0000 mg | CHEWABLE_TABLET | Freq: Every day | ORAL | Status: DC

## 2012-10-19 NOTE — Evaluation (Signed)
Physical Therapy Evaluation Patient Details Name: [name] MRN: [id number] DOB: 05-01-1941 Today's Date: 10/19/2012 Time: 1030-1055 PT Time Calculation (min): 25 min  PT Assessment / Plan / Recommendation Clinical Impression  71 yo female admitted for diplopia and CT 12.24.13 revealed chronic lucunar infarct along the medial RT frontal lobe. Pt with history of TIA in 2012. Pt at baseline receiving PT for bil Knee arthritis. Pt demonstrates deficits secondary to pain, decreased activity tolerance and generalized weakness. Recommend HHPT for gait and balance    PT Assessment  Patient needs continued PT services    Follow Up Recommendations  Home health PT       Barriers to Discharge None      Equipment Recommendations  None recommended by PT       Frequency Min 4X/week    Precautions / Restrictions Precautions Precautions: Fall Restrictions Weight Bearing Restrictions: No   Pertinent Vitals/Pain Bilateral knee pain 6/10 reported     Mobility  Bed Mobility Bed Mobility: Not assessed Transfers Transfers: Sit to Stand;Stand to Sit Sit to Stand: 5: Supervision;With upper extremity assist;From chair/3-in-1 Stand to Sit: 5: Supervision;With upper extremity assist;To chair/3-in-1 Details for Transfer Assistance: no deficits noted. Pt supervision due to first attempt at mobility with therapy Ambulation/Gait Ambulation/Gait Assistance: 4: Min guard Ambulation Distance (Feet): 80 Feet Assistive device: Rolling walker Ambulation/Gait Assistance Details: patient steady but guarded secondary to bilateraly knee pain Gait Pattern: Step-through pattern;Decreased stride length (left foot drag) Gait velocity: decreased General Gait Details: Patient has a significant amount of pain with ambulation in her knees Stairs: No Wheelchair Mobility Wheelchair Mobility: No Modified Rankin (Stroke Patients Only) Pre-Morbid Rankin Score: Slight disability Modified Rankin: Slight  disability           PT Diagnosis: Difficulty walking;Generalized weakness;Abnormality of gait;Acute pain  PT Problem List: Decreased strength;Decreased range of motion;Decreased activity tolerance;Decreased balance;Decreased mobility;Pain PT Treatment Interventions: DME instruction;Gait training;Functional mobility training;Therapeutic exercise;Therapeutic activities;Patient/family education   PT Goals Acute Rehab PT Goals PT Goal Formulation: With patient Pt will go Sit to Stand: with modified independence PT Goal: Sit to Stand - Progress: Goal set today Pt will go Stand to Sit: with modified independence PT Goal: Stand to Sit - Progress: Goal set today Pt will Ambulate: >150 feet;with modified independence;with rolling walker PT Goal: Ambulate - Progress: Goal set today Pt will Go Up / Down Stairs: 1-2 stairs  Visit Information  Last PT Received On: 10/19/12 Assistance Needed: +1    Subjective Data  Subjective: I feel like I am pretty close to where I was Patient Stated Goal: to go home   Prior [place] Lives With: Alone (but family staying for one month (end January)) Available Help at Discharge: Family;Personal care attendant (two days a week) Type of Home: House Home Layout: One level Bathroom Shower/Tub:  (sponge bath but getting a walk in shower) Bathroom Toilet: Standard Home Adaptive Equipment: Bedside commode/3-in-1;Walker - rolling (bariatric RW) Additional Comments: Pt receieves PT 3x week right now for arthritis in knees and balance Prior Function Level of Independence: Independent with assistive device(s) Able to Take Stairs?: No Driving: No Vocation: Retired Corporate investment banker: No difficulties Dominant Hand: Right    Cognition  Overall Cognitive Status: Appears within functional limits for tasks assessed/performed Arousal/Alertness: Awake/alert Orientation Level: Appears intact for tasks assessed Behavior During Session: [facility]  for tasks performed    Extremity/Trunk Assessment Right Upper Extremity Assessment RUE ROM/Strength/Tone: Within functional levels RUE Sensation: WFL - Light Touch RUE  Coordination: WFL - gross/fine motor Left Upper Extremity Assessment LUE ROM/Strength/Tone: Within functional levels LUE Sensation: WFL - Light Touch LUE Coordination: WFL - gross/fine motor Trunk Assessment Trunk Assessment: Normal   Balance    End of Session PT - End of Session Equipment Utilized During Treatment: Gait belt Activity Tolerance: Patient limited by pain Patient left: in chair;with call bell/phone within reach;with family/visitor present Nurse Communication: Mobility status  GP     Duncan Dull 10/19/2012, 12:15 PM  Alben Deeds, [place] DPT  [phone] \

## 2012-10-19 NOTE — Progress Notes (Signed)
Dc instructions provided to pt. Pt verbalized understanding. Pt iv dc intact. Pt under no s/s distress.

## 2012-10-19 NOTE — Evaluation (Signed)
Speech Language Pathology Evaluation Patient Details Name: [name] MRN: [id number] DOB: 1941-01-19 Today's Date: 10/19/2012 Time: [id number] SLP Time Calculation (min): 10 min  Problem List:  Patient Active Problem List  Diagnosis  . Coronary artery disease  . Hypertension  . Hyperlipemia  . Arthritis  . CHF (congestive heart failure)  . Vertigo  . TIA (transient ischemic attack)  . Pacemaker  . MI (myocardial infarction)  . Morbid obesity  . CVA (cerebral infarction)  . Headache   Past Medical History:  Past Medical History  Diagnosis Date  . Coronary artery disease     Cath 04/2006 normal coronaries, mild-mod slow flow in all coronaries with slow distall runoff, mild-mod LV enlargement, EF 55%   . Hypertension   . Hyperlipemia   . Arthritis   . CHF (congestive heart failure)     05/2011 echo severe concentric LVH, normal systolic fxn, grade 1 diastolic dysfxn, paradoxical septal motion  . Vertigo   . 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 Pacemaker     For second degree HB  . Hypercholesterolemia   . MI (myocardial infarction)   . Morbid obesity   . Angina   . Shortness of breath   . Sleep apnea   . Stroke   . GERD (gastroesophageal reflux disease)   . Headache   . Dysrhythmia   . Peripheral vascular disease   . Pneumonia    Past Surgical History:  Past Surgical History  Procedure Date  . Pacemaker insertion   . Pacemaker insertion   . Vascular surgery   . Cardiac catheterization   . Dilation and curettage of uterus   . Insert / replace / remove pacemaker   . Tubal ligation    HPI:    71 yo female admitted for diplopia and CT 12.24.13 revealed chronic lucunar infarct along the medial RT frontal lobe. Pt with history of TIA in 2012.     Assessment / Plan / Recommendation Clinical Impression  Upon evaluation today patient presents with [facility] cognitive-linguistic abilities with normal  oral range of motion and strength, adequate recall of hospital course and verbal awareness of deficits and discharge plan.  Family at side confirms and as a result, no skilled SLP services are warranted at this time.      SLP Assessment  Patient does not need any further Speech Lanaguage Pathology Services    Follow Up Recommendations  None     SLP Evaluation Prior Functioning  Cognitive/Linguistic Baseline: Within functional limits   Cognition  Overall Cognitive Status: Appears within functional limits for tasks assessed Arousal/Alertness: Awake/alert Orientation Level: Oriented X4 Memory: Appears intact Awareness: Appears intact Safety/Judgment: Appears intact    Comprehension  Auditory Comprehension Overall Auditory Comprehension: Appears within functional limits for tasks assessed Visual Recognition/Discrimination Discrimination: Not tested Reading Comprehension Reading Status: Not tested    Expression Expression Primary Mode of Expression: Verbal Verbal Expression Overall Verbal Expression: Appears within functional limits for tasks assessed (increased wait time x1) Written Expression Written Expression: Not tested   Oral / Motor Oral Motor/Sensory Function Overall Oral Motor/Sensory Function: Appears within functional limits for tasks assessed Motor Speech Overall Motor Speech: Appears within functional limits for tasks assessed   GO    Carmelia Roller., CCC-SLP [id number]  [name] 10/19/2012, 2:02 PM

## 2012-10-19 NOTE — Care Management Note (Signed)
    Page 1 of 2   10/19/2012     2:46:01 PM   CARE MANAGEMENT NOTE 10/19/2012  Patient:  [name], [name]   Account Number:  [id number]  Date Initiated:  10/19/2012  Documentation initiated by:  Tomi Bamberger  Subjective/Objective Assessment:   dx HA  admit- pt active with [facility], pt,ot , aide and csw.     Action/Plan:   hhpt eval- rec hhpt   Anticipated DC Date:  10/19/2012   Anticipated DC Plan:  [place]  CM consult      [facility] [place] Choice  HOME HEALTH   Choice offered to / List presented to:  C-1 Patient        [place] arranged  HH-1 RN  [place]      [place]   Status of service:  Completed, signed off Medicare Important Message given?   (If response is "NO", the following Medicare IM given date fields will be blank) Date Medicare IM given:   Date Additional Medicare IM given:    Discharge Disposition:  [place]  Per UR Regulation:  Reviewed for med. necessity/level of care/duration of stay  If discussed at [place] of Stay Meetings, dates discussed:    Comments:  10/19/12 14:44 Tomi Bamberger RN, BSN [phone] patient is for dc today, patient is active with [facility], patient wants to continue with [facility], referral made to [facility] to resume care for hhrn, pt, ot, aide and csw.  Soc will begin 24-48 hrs post discharge.   Patient has medication coverage and transportation.

## 2012-10-19 NOTE — Evaluation (Signed)
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 Pt with history of TIA in 2012. Pt at baseline receiving PT for bil Knee arthritis. OT to sign off acutely. NO acute needs but recommend HHOT for balance and Adls.    OT Assessment  Patient does not need any further OT services    Follow Up Recommendations  Home health OT    Barriers to Discharge      Equipment Recommendations  None recommended by OT    Recommendations for Other Services    Frequency       Precautions / Restrictions Precautions Precautions: Fall   Pertinent Vitals/Pain No pain reported at this time    ADL  Eating/Feeding: Modified independent Where Assessed - Eating/Feeding: Chair Grooming: Wash/dry hands;Wash/dry face;Teeth care;Modified independent Where Assessed - Grooming: Unsupported standing (pt leaning on sink with bil UEs) Lower Body Dressing: Maximal assistance Where Assessed - Lower Body Dressing: Unsupported sitting Toilet Transfer: Modified independent Toilet Transfer Method: Sit to stand Toilet Transfer Equipment: Raised toilet seat with arms (or 3-in-1 over toilet) Equipment Used: Rolling walker Transfers/Ambulation Related to ADLs: pt ambulated with decreased gait velocity but 100% correct safety with hand placement. pt very aware of correct use of RW ADL Comments: pt completed sit<>stand using BIl hands and RW to ambulate to bath room. Pt completed oral care at sink level . Tech Lauren assisted pt with bath this am and pt has this assistance at home from aid. Pt has daughter at home for x1 month to assist at d/c. Pt fatigues with activity demonstrating decreased activity tolerance.       OT Diagnosis:    OT Problem List:   OT Treatment Interventions:     OT Goals    Visit Information  Last OT Received On: 10/19/12 Assistance Needed: +1    Subjective Data  Subjective: "i just hope to go home today"- pt's birthday is tomorrow Patient Stated Goal: to return home for birthday   Prior [place] With: Alone (but family staying for one month (end January)) Available Help at Discharge: Family;Personal care attendant (two days a week) Type of Home: [place]: One level Bathroom Shower/Tub:  (sponge bath but getting a walk in shower) Bathroom Toilet: Standard Home Adaptive Equipment: Bedside commode/3-in-1;Walker - rolling (bariatric RW) Additional Comments: Pt receieves PT 3x week right now for arthritis in knees and balance Prior Function Level of Independence: Independent with assistive device(s) Able to Take Stairs?: No Driving: No Vocation: Retired Corporate investment banker: No difficulties Dominant Hand: Right         Vision/Perception Vision - Assessment Vision Assessment: Vision tested Ocular Range of Motion: Within Functional Limits Additional Comments: Pt completed shape cancellation task , read paper and wrote name DOB with Right hand during session. Pt demonstrates no visual changeds during session. Pt provided questioning cues to determine if visual changes have occurred. Pt self reports no changes at this time.   Cognition  Overall Cognitive Status: Appears within functional limits for tasks assessed/performed Arousal/Alertness: Awake/alert Orientation Level: Appears intact for tasks assessed Behavior During Session: [facility] for tasks performed    Extremity/Trunk Assessment Right Upper Extremity Assessment RUE ROM/Strength/Tone: Within functional levels RUE Sensation: WFL - Light  Touch RUE Coordination: WFL - gross/fine motor Left Upper Extremity Assessment LUE ROM/Strength/Tone: Within functional  levels LUE Sensation: WFL - Light Touch LUE Coordination: WFL - gross/fine motor Trunk Assessment Trunk Assessment: Normal     Mobility Bed Mobility Bed Mobility: Not assessed Transfers Transfers: Sit to Stand;Stand to Sit Sit to Stand: 5: Supervision;With upper extremity assist;From chair/3-in-1 Stand to Sit: 5: Supervision;With upper extremity assist;To chair/3-in-1 Details for Transfer Assistance: no deficits noted. Pt supervision due to first attempt at mobility with therapy     Shoulder Instructions     Exercise     Balance     End of Session OT - End of Session Activity Tolerance: Patient tolerated treatment well Patient left: in chair;with call bell/phone within reach Nurse Communication: Mobility status;Precautions  GO     Veneda Melter 10/19/2012, 9:55 AM Pager: [phone]

## 2012-10-19 NOTE — Discharge Summary (Signed)
Physician Discharge Summary  [name] MRN: [id number] DOB/AGE: May 09, 1941 71 y.o.  Admit date: 10/17/2012 Discharge date: 10/19/2012  Primary Care Physician:  Birdie Riddle, MD  Discharge Diagnoses:    . TIA (transient ischemic attack)  . Headache . Coronary artery disease . CHF (congestive heart failure) . Hypertension . Vertigo . Morbid obesity  Consults:  Neurology/stroke service, Dr. Leonie Man  Discharge Medications:   Medication List     As of 10/19/2012  1:40 PM    STOP taking these medications               olmesartan 40 MG tablet   Commonly known as: BENICAR      TAKE these medications         aspirin 81 MG chewable tablet   Chew 1 tablet (81 mg total) by mouth daily.      clopidogrel 75 MG tablet   Commonly known as: PLAVIX   Take 75 mg by mouth daily.      diltiazem 360 MG 24 hr capsule   Commonly known as: CARDIZEM CD   Take 360 mg by mouth daily.      furosemide 40 MG tablet   Commonly known as: LASIX   Take 0.5 tablets (20 mg total) by mouth daily. For fluid      hydrALAZINE 25 MG tablet   Commonly known as: APRESOLINE   Take 25 mg by mouth every 8 (eight) hours.      HYDROcodone-acetaminophen 5-325 MG per tablet   Commonly known as: NORCO/VICODIN   Take 1 tablet by mouth every 6 (six) hours as needed. For pain      isosorbide mononitrate 60 MG 24 hr tablet   Commonly known as: IMDUR   Take 60 mg by mouth daily.      losartan 100 MG tablet   Commonly known as: COZAAR   Take 100 mg by mouth daily.      meclizine 25 MG tablet   Commonly known as: ANTIVERT   Take 12.5-25 mg by mouth 3 (three) times daily as needed. For dizziness      metoCLOPramide 10 MG tablet   Commonly known as: REGLAN   Take 10 mg by mouth every 6 (six) hours as needed. For indigestion      metoCLOPramide 10 MG tablet   Commonly known as: REGLAN   Take 1 tablet (10 mg total) by mouth every 6 (six) hours.      metoprolol succinate 100  MG 24 hr tablet   Commonly known as: TOPROL-XL   Take 100 mg by mouth 2 (two) times daily.      nitroGLYCERIN 0.4 MG SL tablet   Commonly known as: NITROSTAT   Place 0.4 mg under the tongue every 5 (five) minutes as needed.      oxybutynin 5 MG tablet   Commonly known as: DITROPAN   Take 5 mg by mouth 2 (two) times daily.      pantoprazole 40 MG tablet   Commonly known as: PROTONIX   Take 40 mg by mouth daily.      potassium chloride SA 20 MEQ tablet   Commonly known as: K-DUR,KLOR-CON   Take 20 mEq by mouth daily.      rosuvastatin 10 MG tablet   Commonly known as: CRESTOR   Take 10 mg by mouth daily.      traMADol 50 MG tablet   Commonly known as: ULTRAM   Take 50 mg by mouth 2 (two)  times daily as needed. For pain      Vitamin D 2000 UNITS tablet   Take 2,000 Units by mouth daily.           Brief H and P: For complete details please refer to admission H and P, but in brief, [name] is a 71 year old African American female multiple medical problems, notable for mild nonobstructive CAD, hypertension, osteoarthritis, Peripheral vascular disease, pacemaker, dyslipidemia was in her usual state of health until 2 days ago prior to admission. Patient noticed right-sided headache at 9 PM the night before the admission and which was radiating down to the right side of her neck. She subsequently took her blood pressure medicines and felt a little better and then the headache worsened again subsequently presented to the ER by EMS.  In addition she also reports right arm numbness she first noticed this Sunday night, a right arm numbness was a little better and then in the evening it worsened again. On the day of admission, she still had mild numbness in her right arm. Patient was transferred to [facility] Course:   TIA (transient ischemic attack): Symptoms improving, patient was admitted for stroke workup. She was not considered a TPA candidate secondary to  delay in arrival. CT head was done on 10/17/2012 showed no acute intracranial pathology. Unable to do MRI secondary to pacemaker. CT angiogram of the neck was done per neurology recommendation and showed stable neck arteries since 2012, chronically occluded left vertebral artery. CTA head showed stable intracranial CTA. Carotid Doppler shows no evidence of hemodynamically significant internal carotid artery stenosis. PT evaluation recommending home health PT. Patient will continue aspirin, Plavix and statin. 2-D echocardiogram was done, results are still pending. Patient is ambulating in the room without any difficulty. Active Problems:  Coronary artery disease: Stable, no chest pain  - On aspirin, Plavix, BB, hydralazine, statins, Imdur  Hypertension: BP controlled  CHF (congestive heart failure): Currently compensated  Vertigo: Place her back on meclizine on home dose  Morbid obesity: Counseled on diet control and weight loss    Day of Discharge BP 151/98  Pulse 69  Temp 98.4 F (36.9 C) (Oral)  Resp 18  Ht 5\' 6"  (1.676 m)  Wt 156.4 kg (344 lb 12.8 oz)  BMI 55.65 kg/m2  SpO2 98%  Physical Exam: General: Alert and awake oriented x3 not in any acute distress. HEENT: anicteric sclera, pupils reactive to light and accommodation CVS: S1-S2 clear no murmur rubs or gallops Chest: clear to auscultation bilaterally, no wheezing rales or rhonchi Abdomen: soft nontender, nondistended, normal bowel sounds, no organomegaly Extremities: no cyanosis, clubbing or edema noted bilaterally    The results of significant diagnostics from this hospitalization (including imaging, microbiology, ancillary and laboratory) are listed below for reference.    LAB RESULTS: Basic Metabolic Panel:  Lab [id number] 1131 10/17/12 0500  NA -- 141  K -- 3.6  CL -- 103  CO2 -- 28  GLUCOSE -- 125*  BUN -- 16  CREATININE 1.18* 1.40*  CALCIUM -- 9.2  MG -- --  PHOS -- --     Lab 10/17/12 1131 10/17/12 0500    WBC 6.0 6.3  NEUTROABS -- 3.4  HGB 12.9 13.3  HCT 40.0 40.3  MCV 94.8 --  PLT 186 181   Cardiac Enzymes:  Lab 10/17/12 0500  CKTOTAL --  CKMB --  CKMBINDEX --  TROPONINI <0.30   BNP: No components found with this basename: POCBNP:2 CBG: No  results found for this basename: GLUCAP:2 in the last 168 hours  Significant Diagnostic Studies:  Dg Chest 2 View  10/17/2012  *RADIOLOGY REPORT*  Clinical Data: Shortness of breath and chest pain  CHEST - 2 VIEW  Comparison: Study obtained earlier in the day.  Findings: The lungs are clear.  There is mild cardiac enlargement, stable.  Aorta is tortuous but stable.  Pacemaker lead tips are in the right atrium right ventricle.  No adenopathy.  There is degenerative change in the thoracic spine. No pneumothorax.  IMPRESSION: Stable cardiomegaly.  Stable aortic tortuosity.  No edema or consolidation.   Original Report Authenticated By: Lowella Grip, M.D.    Dg Chest 2 View  10/17/2012  *RADIOLOGY REPORT*  Clinical Data: Headache.  CHEST - 2 VIEW  Comparison: Chest radiograph performed 09/19/2011  Findings: The lungs are well-aerated and clear.  There is no evidence of focal opacification, pleural effusion or pneumothorax.  The heart is mildly enlarged; the patient's pacemaker is noted at the right chest wall, with leads ending at the right atrium and right ventricle.  No acute osseous abnormalities are seen.  IMPRESSION: No acute cardiopulmonary process seen; mild cardiomegaly noted.   Original Report Authenticated By: Santa Lighter, M.D.    Ct Head Wo Contrast  10/17/2012  *RADIOLOGY REPORT*  Clinical Data: Severe headache, weakness and numbness.  CT HEAD WITHOUT CONTRAST  Technique:  Contiguous axial images were obtained from the base of the skull through the vertex without contrast.  Comparison: CT of the head performed 09/08/2012  Findings: There is no evidence of acute infarction, mass lesion, or intra- or extra-axial hemorrhage on CT.   Scattered periventricular and subcortical white matter change may reflect small vessel ischemic microangiopathy.  A small chronic lacunar infarct is noted along the medial right frontal lobe.  The posterior fossa, including the cerebellum, brainstem and fourth ventricle, is within normal limits.  The third and lateral ventricles, and basal ganglia are unremarkable in appearance.  No mass effect or midline shift is seen.  There is no evidence of fracture; visualized osseous structures are unremarkable in appearance.  The orbits are within normal limits. The paranasal sinuses and mastoid air cells are well-aerated.  No significant soft tissue abnormalities are seen.  IMPRESSION:  1.  No acute intracranial pathology seen on CT. 2.  Scattered small vessel ischemic microangiopathy. 3.  Small chronic lacunar infarct along the medial right frontal lobe.   Original Report Authenticated By: Santa Lighter, M.D.      Disposition and Follow-up: Discharge Orders    Future Orders Please Complete By Expires   Diet - low sodium heart healthy      Increase activity slowly          DISPOSITION: home DIET: Heart healthy diet ACTIVITY: As tolerated   DISCHARGE FOLLOW-UP Follow-up Information    Follow up with [facility], MD. Schedule an appointment as soon as possible for a visit in 14 days. (for hospital follow-up)    Contact information:   [place] [id number] [phone]       Follow up with Forbes Cellar, MD. Schedule an appointment as soon as possible for a visit in 2 months. (for stroke follow-up)    Contact information:   [place], [address]  [id number] [phone]          Time spent on Discharge: 37 minutes  Signed:   Roberts Bon M.D. [facility] 10/19/2012, 1:40 PM Pager:  [phone]     

## 2012-10-19 NOTE — Progress Notes (Signed)
Occupational Therapy Discharge Patient Details Name: [name] MRN: [id number] DOB: 1941-08-09 Today's Date: 10/19/2012 Time: [id number] OT Time Calculation (min): 17 min  Patient discharged from OT services secondary to goals met and no further OT needs identified.  Please see latest therapy progress note for current level of functioning and progress toward goals.    Progress and discharge plan discussed with patient and/or caregiver: Patient/Caregiver agrees with plan Recommend HHOT for balance activity tolerance and increase independence with LB adls.      Sharol Harness Henry Pager: [phone]  10/19/2012, 9:56 AM

## 2012-10-19 NOTE — Progress Notes (Signed)
Stroke Team Progress Note  HISTORY [name] is an 71 y.o. female who developed right eye diplopia on 10/15/2012, then right sided headache on 10/16/2012 at 1500 followed by right face, neck and arm pain and right arm/leg weakness. She continues to have these same symptoms although she reports that the diplopia is intermittent. It is not distance related.   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  Patient was not a TPA candidate secondary to delay in arrival. She was admitted for further evaluation and treatment.  SUBJECTIVE Her daughter is at the bedside.  Overall she feels her condition is unchanged. Ready to go home.     OBJECTIVE Most recent Vital Signs: Filed Vitals:   10/18/12 2245 10/19/12 0254 10/19/12 0515 10/19/12 1104  BP: 124/76 170/86 161/95 151/98  Pulse: 70 69 70 69  Temp: 98.3 F (36.8 C) 98.3 F (36.8 C) 98.4 F (36.9 C)   TempSrc: Oral Oral Oral   Resp: 16 16 18 18   Height:      Weight:      SpO2: 97% 96% 97% 98%   CBG (last 3)  No results found for this basename: GLUCAP:3 in the last 72 hours  IV Fluid Intake:     MEDICATIONS     . aspirin  81 mg Oral Daily  . atorvastatin  20 mg Oral q1800  . clopidogrel  75 mg Oral Q breakfast  . diltiazem  360 mg Oral Daily  . enoxaparin (LOVENOX) injection  30 mg Subcutaneous Daily  . hydrALAZINE  25 mg Oral Q8H  . isosorbide mononitrate  60 mg Oral Daily  . metoCLOPramide  10 mg Oral Q6H  . metoprolol succinate  100 mg Oral BID  . pantoprazole  40 mg Oral Q1200  . potassium chloride SA  20 mEq Oral Daily   PRN:  HYDROcodone-acetaminophen, meclizine, metoCLOPramide, senna-docusate, traMADol  Diet:  Cardiac thin liquids Activity:  OOB with assistance DVT Prophylaxis:  Lovenox 30 mg sq daily   CLINICALLY SIGNIFICANT STUDIES Basic Metabolic Panel:   Lab [id number] 1131 10/17/12 0500  NA -- 141  K -- 3.6  CL -- 103  CO2 -- 28  GLUCOSE -- 125*    BUN -- 16  CREATININE 1.18* 1.40*  CALCIUM -- 9.2  MG -- --  PHOS -- --   Liver Function Tests: No results found for this basename: AST:2,ALT:2,ALKPHOS:2,BILITOT:2,PROT:2,ALBUMIN:2 in the last 168 hours CBC:   Lab 10/17/12 1131 10/17/12 0500  WBC 6.0 6.3  NEUTROABS -- 3.4  HGB 12.9 13.3  HCT 40.0 40.3  MCV 94.8 96.0  PLT 186 181   Coagulation: No results found for this basename: LABPROT:4,INR:4 in the last 168 hours Cardiac Enzymes:   Lab 10/17/12 0500  CKTOTAL --  CKMB --  CKMBINDEX --  TROPONINI <0.30   Urinalysis:   Lab 10/17/12 0518  COLORURINE YELLOW  LABSPEC 1.011  PHURINE 7.0  GLUCOSEU NEGATIVE  HGBUR NEGATIVE  BILIRUBINUR NEGATIVE  KETONESUR NEGATIVE  PROTEINUR NEGATIVE  UROBILINOGEN 0.2  NITRITE NEGATIVE  LEUKOCYTESUR NEGATIVE   Lipid Panel    Component Value Date/Time   CHOL 131 10/18/2012 0500   TRIG 88 10/18/2012 0500   HDL 45 10/18/2012 0500   CHOLHDL 2.9 10/18/2012 0500   VLDL 18 10/18/2012 0500   LDLCALC 68 10/18/2012 0500   HgbA1C  Lab Results  Component Value Date   HGBA1C 5.8* 10/18/2012    Urine Drug Screen:   No results found for this  basename: labopia,  cocainscrnur,  labbenz,  amphetmu,  thcu,  labbarb    Alcohol Level: No results found for this basename: ETH:2 in the last 168 hours  CT of the brain  10/17/2012  1.  No acute intracranial pathology seen on CT. 2.  Scattered small vessel ischemic microangiopathy. 3.  Small chronic lacunar infarct along the medial right frontal lobe.    MRI of the brain  pacemaker  MRA of the brain  pacemaker  2D Echocardiogram  EF 45-50%, hypertrophy, LAE, no pfo identified.  Carotid Doppler  No evidence of hemodynamically significant internal carotid artery stenosis. Vertebral artery flow is antegrade.   CXR   10/17/2012 Stable cardiomegaly.  Stable aortic tortuosity.  No edema or consolidation.    10/17/2012   No acute cardiopulmonary process seen; mild cardiomegaly noted.     EKG  AV  dual paced rhythm.   Therapy Recommendations   Physical Exam   GENERAL EXAM: Patient is in no distress  CARDIOVASCULAR: Regular rate and rhythm, no murmurs, no carotid bruits  NEUROLOGIC: MENTAL STATUS: awake, alert, language fluent, comprehension intact, naming intact CRANIAL NERVE: pupils equal and reactive to light, visual fields full to confrontation, RIGHT EYE EXOTROPIA/EXOPHORIA. SUBTLE RIGHT PTOSIS. Extraocular muscles intact, no nystagmus, DECR RIGHT FACIAL SENSATION. Facial strength symmetric, uvula midline, shoulder shrug symmetric, tongue midline. MOTOR: normal bulk and tone, full strength in the BUE, BLE SENSORY: RIGHT ARM AND LEG DECR SENSATION. [name] Kitchen COORDINATION: finger-nose-finger, fine finger movements, heel-shin normal REFLEXES: deep tendon reflexes present and symmetric GAIT/STATION: deferred   ASSESSMENT [name] is a 71 y.o. female presenting Monday night at [facility] with intermittent right eye diplopia for seconds at a time  followed by headache with right face, neck and arm pain, numbness, then right arm and leg weakness. Suspect left brain infarct. Imaging pending. Work up underway. On aspirin 81 mg orally every day and clopidogrel 75 mg orally every day prior to admission. Now on aspirin 81 mg orally every day and clopidogrel 75 mg orally every day for secondary stroke prevention. Patient with resultant right hemiparesis, sensory deficit, diplopia..   Malignant hypertension, BP 205/110  Morbid obesity, Body mass index is 55.65 kg/(m^2).  CAD, MI Hyperlipidemia, LDL 68, on statin PTA, at goal LDL < 100 CHF Arthritis Vertigo Hx TIA w/ L VA occlusion on cath angio on 05/2011 OSA, does not use CPAP PVD Family hx stroke, mother died d/t stroke, bro died at age 63 d/t cerebral aneurysm Pacemaker for second degree heart block  Hospital day # 2  TREATMENT/PLAN  Continue aspirin 81 mg orally every day and clopidogrel 75 mg orally every day  for secondary stroke prevention if cardiac indication for these as patient was on plavix/asa prior to admission, otherwise change to aggrenox twice daily.  Risk factor modification  Plan to follow-up with Dr. Leonie Man in 2 mos  Regenia Skeeter. Owens Shark, [facility], [place] Pager: [phone] 10/19/2012 2:08 PM   I have personally obtained a history, examined the patient, evaluated imaging results, and formulated the assessment and plan of care. I agree with the above.   Antony Contras, MD Medical Director [facility] Pager: [phone] 10/19/2012 6:27 PM

## 2013-02-07 ENCOUNTER — Emergency Department (HOSPITAL_BASED_OUTPATIENT_CLINIC_OR_DEPARTMENT_OTHER): Payer: Medicare Other

## 2013-02-07 ENCOUNTER — Observation Stay (HOSPITAL_COMMUNITY): Payer: Medicare Other

## 2013-02-07 ENCOUNTER — Encounter (HOSPITAL_BASED_OUTPATIENT_CLINIC_OR_DEPARTMENT_OTHER): Payer: Self-pay | Admitting: Emergency Medicine

## 2013-02-07 ENCOUNTER — Observation Stay (HOSPITAL_BASED_OUTPATIENT_CLINIC_OR_DEPARTMENT_OTHER)
Admission: EM | Admit: 2013-02-07 | Discharge: 2013-02-08 | Disposition: A | Discharge status: Home / Self Care | Payer: Medicare Other | Attending: Internal Medicine | Admitting: Internal Medicine

## 2013-02-07 DIAGNOSIS — R471 Dysarthria and anarthria: Secondary | ICD-10-CM | POA: Insufficient documentation

## 2013-02-07 DIAGNOSIS — I6350 Cerebral infarction due to unspecified occlusion or stenosis of unspecified cerebral artery: Secondary | ICD-10-CM

## 2013-02-07 DIAGNOSIS — M129 Arthropathy, unspecified: Secondary | ICD-10-CM

## 2013-02-07 DIAGNOSIS — R209 Unspecified disturbances of skin sensation: Secondary | ICD-10-CM | POA: Insufficient documentation

## 2013-02-07 DIAGNOSIS — I219 Acute myocardial infarction, unspecified: Secondary | ICD-10-CM

## 2013-02-07 DIAGNOSIS — M1990 Unspecified osteoarthritis, unspecified site: Secondary | ICD-10-CM

## 2013-02-07 DIAGNOSIS — I509 Heart failure, unspecified: Secondary | ICD-10-CM

## 2013-02-07 DIAGNOSIS — Z950 Presence of cardiac pacemaker: Secondary | ICD-10-CM

## 2013-02-07 DIAGNOSIS — E785 Hyperlipidemia, unspecified: Secondary | ICD-10-CM

## 2013-02-07 DIAGNOSIS — G459 Transient cerebral ischemic attack, unspecified: Secondary | ICD-10-CM

## 2013-02-07 DIAGNOSIS — I10 Essential (primary) hypertension: Principal | ICD-10-CM

## 2013-02-07 DIAGNOSIS — R51 Headache: Secondary | ICD-10-CM

## 2013-02-07 DIAGNOSIS — R4789 Other speech disturbances: Secondary | ICD-10-CM | POA: Insufficient documentation

## 2013-02-07 DIAGNOSIS — Z8673 Personal history of transient ischemic attack (TIA), and cerebral infarction without residual deficits: Secondary | ICD-10-CM | POA: Insufficient documentation

## 2013-02-07 DIAGNOSIS — R42 Dizziness and giddiness: Secondary | ICD-10-CM

## 2013-02-07 DIAGNOSIS — I6509 Occlusion and stenosis of unspecified vertebral artery: Secondary | ICD-10-CM | POA: Insufficient documentation

## 2013-02-07 DIAGNOSIS — I639 Cerebral infarction, unspecified: Secondary | ICD-10-CM

## 2013-02-07 DIAGNOSIS — I2510 Atherosclerotic heart disease of native coronary artery without angina pectoris: Secondary | ICD-10-CM

## 2013-02-07 LAB — HEMOGLOBIN A1C
Hgb A1c MFr Bld: 5.8 % — ABNORMAL HIGH (ref <5.7)
Mean Plasma Glucose: 120 mg/dL — ABNORMAL HIGH (ref <117)

## 2013-02-07 LAB — COMPREHENSIVE METABOLIC PANEL
BUN: 21 mg/dL (ref 6–23)
CO2: 27 mEq/L (ref 19–32)
Calcium: 9.3 mg/dL (ref 8.4–10.5)
Creatinine, Ser: 1.3 mg/dL — ABNORMAL HIGH (ref 0.50–1.10)
GFR calc Af Amer: 47 mL/min — ABNORMAL LOW (ref >90)
GFR calc non Af Amer: 41 mL/min — ABNORMAL LOW (ref >90)
Glucose, Bld: 132 mg/dL — ABNORMAL HIGH (ref 70–99)
Sodium: 141 mEq/L (ref 135–145)
Total Protein: 7.4 g/dL (ref 6.0–8.3)

## 2013-02-07 LAB — CBC WITH DIFFERENTIAL/PLATELET
Eosinophils Absolute: 0.3 10*3/uL (ref 0.0–0.7)
Eosinophils Relative: 5 % (ref 0–5)
HCT: 40.4 % (ref 36.0–46.0)
Lymphocytes Relative: 30 % (ref 12–46)
Lymphs Abs: 1.7 10*3/uL (ref 0.7–4.0)
MCH: 31.8 pg (ref 26.0–34.0)
MCV: 95.3 fL (ref 78.0–100.0)
Monocytes Absolute: 0.5 10*3/uL (ref 0.1–1.0)
Monocytes Relative: 9 % (ref 3–12)
Platelets: 200 10*3/uL (ref 150–400)
RBC: 4.24 MIL/uL (ref 3.87–5.11)

## 2013-02-07 LAB — LIPID PANEL
Cholesterol: 125 mg/dL (ref 0–200)
HDL: 49 mg/dL (ref >39)
Total CHOL/HDL Ratio: 2.6 RATIO

## 2013-02-07 LAB — CBC
HCT: 37 % (ref 36.0–46.0)
MCV: 91.8 fL (ref 78.0–100.0)
RDW: 14.3 % (ref 11.5–15.5)
WBC: 5.9 10*3/uL (ref 4.0–10.5)

## 2013-02-07 LAB — URINALYSIS, ROUTINE W REFLEX MICROSCOPIC
Bilirubin Urine: NEGATIVE
Leukocytes, UA: NEGATIVE
Nitrite: NEGATIVE
Specific Gravity, Urine: 1.017 (ref 1.005–1.030)
Urobilinogen, UA: 0.2 mg/dL (ref 0.0–1.0)
pH: 6 (ref 5.0–8.0)

## 2013-02-07 LAB — CREATININE, SERUM: GFR calc Af Amer: 58 mL/min — ABNORMAL LOW (ref >90)

## 2013-02-07 LAB — GLUCOSE, CAPILLARY: Glucose-Capillary: 111 mg/dL — ABNORMAL HIGH (ref 70–99)

## 2013-02-07 LAB — SEDIMENTATION RATE: Sed Rate: 21 mm/hr (ref 0–22)

## 2013-02-07 MED ORDER — VITAMIN D 50 MCG (2000 UT) PO TABS: 2000.0000 [IU] | ORAL_TABLET | Freq: Every day | ORAL | Status: DC

## 2013-02-07 MED ORDER — SODIUM CHLORIDE 0.9 % IV SOLN: INTRAVENOUS | Status: DC

## 2013-02-07 MED ORDER — METOPROLOL SUCCINATE ER 100 MG PO TB24
100.0000 mg | ORAL_TABLET | Freq: Two times a day (BID) | ORAL | Status: DC
  Administered 2013-02-07 – 2013-02-08 (×3): 100 mg via ORAL
  Filled 2013-02-07 (×4): qty 1

## 2013-02-07 MED ORDER — DIPHENHYDRAMINE HCL 50 MG/ML IJ SOLN
12.5000 mg | Freq: Once | INTRAMUSCULAR | Status: AC
  Administered 2013-02-07: 12.5 mg via INTRAVENOUS
  Filled 2013-02-07: qty 1

## 2013-02-07 MED ORDER — ENOXAPARIN SODIUM 80 MG/0.8ML ~~LOC~~ SOLN: 80.0000 mg | SUBCUTANEOUS | Status: DC

## 2013-02-07 MED ORDER — METOCLOPRAMIDE HCL 5 MG/ML IJ SOLN
10.0000 mg | Freq: Once | INTRAMUSCULAR | Status: AC
  Administered 2013-02-07: 10 mg via INTRAVENOUS
  Filled 2013-02-07: qty 2

## 2013-02-07 MED ORDER — AMLODIPINE BESYLATE 5 MG PO TABS
5.0000 mg | ORAL_TABLET | Freq: Once | ORAL | Status: AC
  Administered 2013-02-07: 5 mg via ORAL
  Filled 2013-02-07: qty 1

## 2013-02-07 MED ORDER — ENOXAPARIN SODIUM 80 MG/0.8ML ~~LOC~~ SOLN
80.0000 mg | SUBCUTANEOUS | Status: DC
  Administered 2013-02-07 – 2013-02-08 (×2): 80 mg via SUBCUTANEOUS
  Filled 2013-02-07 (×2): qty 0.8

## 2013-02-07 MED ORDER — ATORVASTATIN CALCIUM 20 MG PO TABS
20.0000 mg | ORAL_TABLET | Freq: Every day | ORAL | Status: DC
  Administered 2013-02-07 – 2013-02-08 (×2): 20 mg via ORAL
  Filled 2013-02-07 (×2): qty 1

## 2013-02-07 MED ORDER — PANTOPRAZOLE SODIUM 40 MG PO TBEC
40.0000 mg | DELAYED_RELEASE_TABLET | Freq: Every day | ORAL | Status: DC
  Administered 2013-02-07 – 2013-02-08 (×2): 40 mg via ORAL
  Filled 2013-02-07 (×2): qty 1

## 2013-02-07 MED ORDER — MECLIZINE HCL 12.5 MG PO TABS
12.5000 mg | ORAL_TABLET | Freq: Three times a day (TID) | ORAL | Status: DC | PRN
Start: 2013-02-07 — End: 2013-02-08
  Filled 2013-02-07: qty 2

## 2013-02-07 MED ORDER — IOHEXOL 350 MG/ML SOLN
100.0000 mL | Freq: Once | INTRAVENOUS | Status: AC | PRN
  Administered 2013-02-07: 100 mL via INTRAVENOUS

## 2013-02-07 MED ORDER — HYDROCODONE-ACETAMINOPHEN 5-325 MG PO TABS
1.0000 | ORAL_TABLET | Freq: Four times a day (QID) | ORAL | Status: DC | PRN
  Administered 2013-02-07 – 2013-02-08 (×2): 1 via ORAL
  Filled 2013-02-07 (×2): qty 1

## 2013-02-07 MED ORDER — LABETALOL HCL 5 MG/ML IV SOLN
10.0000 mg | Freq: Once | INTRAVENOUS | Status: AC
  Administered 2013-02-07: 10 mg via INTRAVENOUS
  Filled 2013-02-07: qty 4

## 2013-02-07 MED ORDER — SODIUM CHLORIDE 0.9 % IV SOLN
INTRAVENOUS | Status: DC
  Administered 2013-02-07: 11:00:00 via INTRAVENOUS

## 2013-02-07 MED ORDER — TRAMADOL HCL 50 MG PO TABS: 50.0000 mg | ORAL_TABLET | Freq: Two times a day (BID) | ORAL | Status: DC | PRN

## 2013-02-07 MED ORDER — IOHEXOL 350 MG/ML SOLN
75.0000 mL | Freq: Once | INTRAVENOUS | Status: AC | PRN
  Administered 2013-02-07: 75 mL via INTRAVENOUS

## 2013-02-07 MED ORDER — ACETAMINOPHEN 325 MG PO TABS: 650.0000 mg | ORAL_TABLET | ORAL | Status: DC | PRN

## 2013-02-07 MED ORDER — ASPIRIN 81 MG PO CHEW
81.0000 mg | CHEWABLE_TABLET | Freq: Every day | ORAL | Status: DC
  Administered 2013-02-07 – 2013-02-08 (×2): 81 mg via ORAL
  Filled 2013-02-07 (×2): qty 1

## 2013-02-07 MED ORDER — ENOXAPARIN SODIUM 40 MG/0.4ML ~~LOC~~ SOLN
40.0000 mg | SUBCUTANEOUS | Status: DC
  Filled 2013-02-07: qty 0.4

## 2013-02-07 MED ORDER — HYDROCODONE-ACETAMINOPHEN 5-325 MG PO TABS
1.0000 | ORAL_TABLET | Freq: Once | ORAL | Status: AC
  Administered 2013-02-07: 1 via ORAL
  Filled 2013-02-07: qty 1

## 2013-02-07 MED ORDER — PNEUMOCOCCAL VAC POLYVALENT 25 MCG/0.5ML IJ INJ
0.5000 mL | INJECTION | INTRAMUSCULAR | Status: DC
  Filled 2013-02-07: qty 0.5

## 2013-02-07 MED ORDER — CLOPIDOGREL BISULFATE 75 MG PO TABS
75.0000 mg | ORAL_TABLET | Freq: Every day | ORAL | Status: DC
  Administered 2013-02-07 – 2013-02-08 (×2): 75 mg via ORAL
  Filled 2013-02-07 (×2): qty 1

## 2013-02-07 MED ORDER — VITAMIN D3 25 MCG (1000 UNIT) PO TABS
2000.0000 [IU] | ORAL_TABLET | Freq: Every day | ORAL | Status: DC
  Administered 2013-02-07 – 2013-02-08 (×2): 2000 [IU] via ORAL
  Filled 2013-02-07 (×2): qty 2

## 2013-02-07 MED ORDER — OXYBUTYNIN CHLORIDE 5 MG PO TABS
5.0000 mg | ORAL_TABLET | Freq: Two times a day (BID) | ORAL | Status: DC
Start: 2013-02-07 — End: 2013-02-08
  Administered 2013-02-07 – 2013-02-08 (×3): 5 mg via ORAL
  Filled 2013-02-07 (×4): qty 1

## 2013-02-07 MED ORDER — DILTIAZEM HCL ER COATED BEADS 360 MG PO CP24
360.0000 mg | ORAL_CAPSULE | Freq: Every day | ORAL | Status: DC
  Administered 2013-02-07 – 2013-02-08 (×2): 360 mg via ORAL
  Filled 2013-02-07 (×2): qty 1

## 2013-02-07 NOTE — ED Notes (Signed)
Warm blanket given

## 2013-02-07 NOTE — Progress Notes (Signed)
*  PRELIMINARY RESULTS* Vascular Ultrasound Carotid Duplex (Doppler) has been completed.  Preliminary findings: Bilateral:  No evidence of hemodynamically significant internal carotid artery stenosis.   Vertebral artery flow is antegrade.      Landry Mellow, RDMS, RVT  02/07/2013, 10:40 AM

## 2013-02-07 NOTE — ED Notes (Signed)
Family at bedside. 

## 2013-02-07 NOTE — ED Notes (Signed)
MD at bedside. 

## 2013-02-07 NOTE — Progress Notes (Signed)
EEG Completed; results pending

## 2013-02-07 NOTE — ED Provider Notes (Signed)
History     CSN: [id number]  Arrival date & time 02/07/13  0039   First MD Initiated Contact with Patient 02/07/13 0100      Chief Complaint  Patient presents with  . Hypertension  . Headache    (Consider location/radiation/quality/duration/timing/severity/associated sxs/prior treatment) Patient is a 72 y.o. female presenting with hypertension. The history is provided by a relative and the patient.  Hypertension This is a recurrent problem. The current episode started 3 to 5 hours ago. The problem occurs constantly. The problem has not changed since onset.Associated symptoms include headaches. Pertinent negatives include no chest pain, no abdominal pain and no shortness of breath. Nothing aggravates the symptoms. Nothing relieves the symptoms. She has tried nothing for the symptoms. The treatment provided no relief.  Also was noted to have rapid HR by EMS and HA and dysarthria and confusion/confused speech that lasted for 2 hours and is now improved.    Past Medical History  Diagnosis Date  . Coronary artery disease     Cath 04/2006 normal coronaries, mild-mod slow flow in all coronaries with slow distall runoff, mild-mod LV enlargement, EF 55%   . Hypertension   . Hyperlipemia   . Arthritis   . CHF (congestive heart failure)     05/2011 echo severe concentric LVH, normal systolic fxn, grade 1 diastolic dysfxn, paradoxical septal motion  . Vertigo   . 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 Pacemaker     For second degree HB  . Hypercholesterolemia   . MI (myocardial infarction)   . Morbid obesity   . Angina   . Shortness of breath   . Sleep apnea   . Stroke   . GERD (gastroesophageal reflux disease)   . Headache   . Dysrhythmia   . Peripheral vascular disease   . Pneumonia     Past Surgical History  Procedure Laterality Date  . Pacemaker insertion    . Pacemaker insertion    . Vascular surgery    . Cardiac  catheterization    . Dilation and curettage of uterus    . Insert / replace / remove pacemaker    . Tubal ligation      No family history on file.  History  Substance Use Topics  . Smoking status: Never Smoker   . Smokeless tobacco: Never Used  . Alcohol Use: No    OB History   Grav Para Term Preterm Abortions TAB SAB Ect Mult Living                  Review of Systems  Respiratory: Negative for shortness of breath.   Cardiovascular: Negative for chest pain.  Gastrointestinal: Negative for abdominal pain.  Neurological: Positive for headaches. Negative for facial asymmetry.  All other systems reviewed and are negative.    Allergies  Ampicillin  Home Medications   Current Outpatient Rx  Name  Route  Sig  Dispense  Refill  . aspirin 81 MG chewable tablet   Oral   Chew 1 tablet (81 mg total) by mouth daily.         . clopidogrel (PLAVIX) 75 MG tablet   Oral   Take 75 mg by mouth daily.          [name] Kitchen diltiazem (CARDIZEM CD) 360 MG 24 hr capsule   Oral   Take 360 mg by mouth daily.          [name] Kitchen  furosemide (LASIX) 40 MG tablet   Oral   Take 0.5 tablets (20 mg total) by mouth daily. For fluid   31 tablet   3   . hydrALAZINE (APRESOLINE) 25 MG tablet   Oral   Take 25 mg by mouth every 8 (eight) hours.          . isosorbide mononitrate (IMDUR) 60 MG 24 hr tablet   Oral   Take 60 mg by mouth daily.          [name] Kitchen losartan (COZAAR) 100 MG tablet   Oral   Take 100 mg by mouth daily.         . meclizine (ANTIVERT) 25 MG tablet   Oral   Take 12.5-25 mg by mouth 3 (three) times daily as needed. For dizziness         . metoCLOPramide (REGLAN) 10 MG tablet   Oral   Take 10 mg by mouth every 6 (six) hours as needed. For indigestion         . metoprolol (TOPROL-XL) 100 MG 24 hr tablet   Oral   Take 100 mg by mouth 2 (two) times daily.          [name] Kitchen oxybutynin (DITROPAN) 5 MG tablet   Oral   Take 5 mg by mouth 2 (two) times daily.         .  pantoprazole (PROTONIX) 40 MG tablet   Oral   Take 40 mg by mouth daily.           . potassium chloride SA (K-DUR,KLOR-CON) 20 MEQ tablet   Oral   Take 20 mEq by mouth daily.          . rosuvastatin (CRESTOR) 10 MG tablet   Oral   Take 10 mg by mouth daily.          . traMADol (ULTRAM) 50 MG tablet   Oral   Take 50 mg by mouth 2 (two) times daily as needed. For pain         . Cholecalciferol (VITAMIN D) 2000 UNITS tablet   Oral   Take 2,000 Units by mouth daily.          [name] Kitchen HYDROcodone-acetaminophen (NORCO/VICODIN) 5-325 MG per tablet   Oral   Take 1 tablet by mouth every 6 (six) hours as needed. For pain         . EXPIRED: metoCLOPramide (REGLAN) 10 MG tablet   Oral   Take 1 tablet (10 mg total) by mouth every 6 (six) hours.   30 tablet   0   . nitroGLYCERIN (NITROSTAT) 0.4 MG SL tablet   Sublingual   Place 0.4 mg under the tongue every 5 (five) minutes as needed.             BP 188/104  Pulse 70  Temp(Src) 98.1 F (36.7 C) (Oral)  Resp 18  Ht 5\' 6"  (1.676 m)  Wt 332 lb (150.594 kg)  BMI 53.61 kg/m2  SpO2 97%  Physical Exam  Constitutional: She appears well-developed and well-nourished. No distress.  HENT:  Head: Normocephalic and atraumatic.  Mouth/Throat: Oropharynx is clear and moist.  Eyes: Conjunctivae and EOM are normal. Pupils are equal, round, and reactive to light.  Neck: Normal range of motion. Neck supple.  Cardiovascular: Normal rate, regular rhythm and intact distal pulses.   Pulmonary/Chest: She has decreased breath sounds.  Abdominal: Soft. Bowel sounds are normal. There is no tenderness. There is no rebound and no guarding.  Musculoskeletal:  Normal range of motion.  Neurological: She is alert. She has normal reflexes. No cranial nerve deficit.  Skin: Skin is warm and dry.  Psychiatric: She has a normal mood and affect.    ED Course  Procedures (including critical care time)  Labs Reviewed  CBC WITH DIFFERENTIAL  TROPONIN  I  COMPREHENSIVE METABOLIC PANEL  URINALYSIS, ROUTINE W REFLEX MICROSCOPIC   No results found.   No diagnosis found.    MDM   Date: 02/07/2013  Rate: 75  Rhythm: electronically paced  QRS Axis: left  Intervals: normal  ST/T Wave abnormalities: normal  Conduction Disutrbances:none  Narrative Interpretation:   Old EKG Reviewed: none available       Case d/w Dr. Everitt Amber of neuro admit for tia work up  Shany Marinez K Antino Mayabb-Rasch, MD 02/07/13 [phone]

## 2013-02-07 NOTE — Progress Notes (Signed)
  Echocardiogram 2D Echocardiogram has been performed.  Reginold Beale, [facility] 02/07/2013, 11:54 AM

## 2013-02-07 NOTE — H&P (Signed)
History and Physical       Hospital Admission Note Date: 02/07/2013  Patient name: [name] Medical record number: [id number] Date of birth: [birth year] Age: 72 y.o. Gender: female PCP: Salena Saner., MD    Chief Complaint:  Headache with right hand numbness, dysarthria last night  HPI: Patient is a 72 year old female with history of coronary disease, hypertension, hyperlipidemia, history of prior TIA/CVA, pacemaker for second-degree heart block presented to [facility] ED with headache and dysarthria, right hand numbness last night. History was obtained from the patient who stated that she went to bed around 11 PM but then work up again around 1 AM and had severe right frontal headache, 9/10 in intensity sharp. She also noticed numbness in her right hand and difficulty speaking. Patient is on aspirin and Plavix prior to the admission.    Review of Systems:  Constitutional: Denies fever, chills, diaphoresis, poor appetite and fatigue.  HEENT: Denies photophobia, eye pain, redness, hearing loss, ear pain, congestion, sore throat, rhinorrhea, sneezing, mouth sores, trouble swallowing, neck pain, neck stiffness and tinnitus.   Respiratory: Denies SOB, DOE, cough, chest tightness,  and wheezing.   Cardiovascular: Denies chest pain, palpitations and leg swelling.  Gastrointestinal: Denies nausea, vomiting, abdominal pain, diarrhea, constipation, blood in stool and abdominal distention.  Genitourinary: Denies dysuria, urgency, frequency, hematuria, flank pain and difficulty urinating.  Musculoskeletal: Denies myalgias, back pain, joint swelling, arthralgias and gait problem.  Skin: Denies pallor, rash and wound.  Neurological: see history of present illness Hematological: Denies adenopathy. Easy bruising, personal or family bleeding history  Psychiatric/Behavioral: Denies suicidal ideation, mood changes, confusion, nervousness,  sleep disturbance and agitation  Past Medical History: Past Medical History  Diagnosis Date  . Coronary artery disease     Cath 04/2006 normal coronaries, mild-mod slow flow in all coronaries with slow distall runoff, mild-mod LV enlargement, EF 55%   . Hypertension   . Hyperlipemia   . Arthritis   . CHF (congestive heart failure)     05/2011 echo severe concentric LVH, normal systolic fxn, grade 1 diastolic dysfxn, paradoxical septal motion  . Vertigo   . 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 Pacemaker     For second degree HB  . Hypercholesterolemia   . MI (myocardial infarction)   . Morbid obesity   . Angina   . Shortness of breath   . Sleep apnea   . Stroke   . GERD (gastroesophageal reflux disease)   . Headache   . Dysrhythmia   . Peripheral vascular disease   . Pneumonia    Past Surgical History  Procedure Laterality Date  . Pacemaker insertion    . Pacemaker insertion    . Vascular surgery    . Cardiac catheterization    . Dilation and curettage of uterus    . Insert / replace / remove pacemaker    . Tubal ligation      Medications: Prior to Admission medications   Medication Sig Start Date End Date Taking? Authorizing Provider  aspirin 81 MG chewable tablet Chew 1 tablet (81 mg total) by mouth daily. 10/19/12  Yes Ripudeep Krystal Eaton, MD  clopidogrel (PLAVIX) 75 MG tablet Take 75 mg by mouth daily.    Yes Historical Provider, MD  diltiazem (CARDIZEM CD) 360 MG 24 hr capsule Take 360 mg by mouth daily.    Yes Historical Provider, MD  furosemide (LASIX) 40 MG tablet Take 0.5  tablets (20 mg total) by mouth daily. For fluid 09/11/11  Yes Theodis Blaze, MD  hydrALAZINE (APRESOLINE) 25 MG tablet Take 25 mg by mouth every 8 (eight) hours.    Yes Historical Provider, MD  isosorbide mononitrate (IMDUR) 60 MG 24 hr tablet Take 60 mg by mouth daily.    Yes Historical Provider, MD  losartan (COZAAR) 100 MG tablet Take  100 mg by mouth daily.   Yes Historical Provider, MD  meclizine (ANTIVERT) 25 MG tablet Take 12.5-25 mg by mouth 3 (three) times daily as needed. For dizziness   Yes Historical Provider, MD  metoCLOPramide (REGLAN) 10 MG tablet Take 10 mg by mouth every 6 (six) hours as needed. For indigestion   Yes Historical Provider, MD  metoprolol (TOPROL-XL) 100 MG 24 hr tablet Take 100 mg by mouth 2 (two) times daily.    Yes Historical Provider, MD  oxybutynin (DITROPAN) 5 MG tablet Take 5 mg by mouth 2 (two) times daily.   Yes Historical Provider, MD  pantoprazole (PROTONIX) 40 MG tablet Take 40 mg by mouth daily.     Yes Historical Provider, MD  potassium chloride SA (K-DUR,KLOR-CON) 20 MEQ tablet Take 20 mEq by mouth daily.    Yes Historical Provider, MD  rosuvastatin (CRESTOR) 10 MG tablet Take 10 mg by mouth daily.    Yes Historical Provider, MD  traMADol (ULTRAM) 50 MG tablet Take 50 mg by mouth 2 (two) times daily as needed. For pain   Yes Historical Provider, MD  Cholecalciferol (VITAMIN D) 2000 UNITS tablet Take 2,000 Units by mouth daily.     Historical Provider, MD  HYDROcodone-acetaminophen (NORCO/VICODIN) 5-325 MG per tablet Take 1 tablet by mouth every 6 (six) hours as needed. For pain 09/11/11   Theodis Blaze, MD  nitroGLYCERIN (NITROSTAT) 0.4 MG SL tablet Place 0.4 mg under the tongue every 5 (five) minutes as needed.      Historical Provider, MD    Allergies:   Allergies  Allergen Reactions  . Ampicillin Other (See Comments)    Increases heart rhythm     Social History:  reports that she has never smoked. She has never used smokeless tobacco. She reports that she does not drink alcohol or use illicit drugs. She lives at home by herself and is functional with her ADLs  Family History: No family history on file.  Physical Exam: Blood pressure 169/95, pulse 69, temperature 98 F (36.7 C), temperature source Oral, resp. rate 18, height 5\' 6"  (1.676 m), weight 160.5 kg (353 lb 13.4  oz), SpO2 99.00%. General: Alert, awake, oriented x3, in no acute distress. HEENT: normocephalic, atraumatic, anicteric sclera, pink conjunctiva, pupils equal and reactive to light and accomodation, oropharynx clear Neck: supple, no masses or lymphadenopathy, no goiter, no bruits  Heart: Regular rate and rhythm, without murmurs, rubs or gallops. Lungs: Clear to auscultation bilaterally, no wheezing, rales or rhonchi. Abdomen: Soft, nontender, nondistended, positive bowel sounds, no masses. Extremities: No clubbing, cyanosis or edema with positive pedal pulses. Neuro: Grossly intact, no focal neurological deficits, strength 5/5 upper and lower extremities bilaterally, no dysarthria noted, finger-to-nose normal, subtle decrease in the right hand grip Psych: alert and oriented x 3, normal mood and affect Skin: no rashes or lesions, warm and dry   LABS on Admission:  Basic Metabolic Panel:  Recent Labs Lab 02/07/13 0117 02/07/13 0812  NA 141  --   K 3.7  --   CL 105  --   CO2 27  --  GLUCOSE 132*  --   BUN 21  --   CREATININE 1.30* 1.10  CALCIUM 9.3  --    Liver Function Tests:  Recent Labs Lab 02/07/13 0117  AST 18  ALT 10  ALKPHOS 105  BILITOT 0.2*  PROT 7.4  ALBUMIN 3.6   No results found for this basename: LIPASE, AMYLASE,  in the last 168 hours No results found for this basename: AMMONIA,  in the last 168 hours CBC:  Recent Labs Lab 02/07/13 0117 02/07/13 0812  WBC 5.7 5.9  NEUTROABS 3.2  --   HGB 13.5 12.4  HCT 40.4 37.0  MCV 95.3 91.8  PLT 200 201   Cardiac Enzymes:  Recent Labs Lab 02/07/13 0117  TROPONINI <0.30   BNP: No components found with this basename: POCBNP,  CBG:  Recent Labs Lab 02/07/13 0702  GLUCAP 111*     Radiological Exams on Admission: Dg Chest 2 View  02/07/2013  *RADIOLOGY REPORT*  Clinical Data: Headache.  CHEST - 2 VIEW  Comparison: Chest radiograph performed 10/17/2012  Findings: The lungs are well-aerated.  Mild  vascular congestion is noted.  There is no evidence of focal opacification, pleural effusion or pneumothorax.  The heart is mildly enlarged.  A pacemaker is seen at the right chest wall, with leads ending at the right atrium and right ventricle.  No acute osseous abnormalities are seen.  IMPRESSION: Mild vascular congestion and mild cardiomegaly; lungs remain grossly clear.   Original Report Authenticated By: Santa Lighter, M.D.    Ct Head Wo Contrast  02/07/2013  *RADIOLOGY REPORT*  Clinical Data: Severe headache.  CT HEAD WITHOUT CONTRAST  Technique:  Contiguous axial images were obtained from the base of the skull through the vertex without contrast.  Comparison: CTA of the head performed 10/18/2012, and CT of the head performed 10/17/2012  Findings: There is no evidence of acute infarction, mass lesion, or intra- or extra-axial hemorrhage on CT.  Scattered periventricular and subcortical white matter change likely reflects small vessel ischemic microangiopathy.  A chronic lacunar infarct is noted anterior to the frontal horn of the right lateral ventricle.  Mild hyperostosis frontalis interna is noted.  The posterior fossa, including the cerebellum, brainstem and fourth ventricle, is within normal limits.  The third and lateral ventricles, and basal ganglia are unremarkable in appearance.  No mass effect or midline shift is seen.  There is no evidence of fracture; visualized osseous structures are unremarkable in appearance.  The visualized portions of the orbits are within normal limits.  The paranasal sinuses and mastoid air cells are well-aerated.  No significant soft tissue abnormalities are seen.  IMPRESSION:  1.  No acute intracranial pathology seen on CT. 2.  Scattered small vessel ischemic microangiopathy; chronic lacunar infarct noted anterior to the frontal horn of the right lateral ventricle.   Original Report Authenticated By: Santa Lighter, M.D.     Assessment/Plan Principal Problem:   TIA  (transient ischemic attack): Multiple risk factors including hypertension, hyperlipidemia, prior CVA, coronary artery disease, CHF - Admit for full CVA work-up, CT head is negative for acute stroke. Unfortunately cannot have MRI due to pacemaker - Obtain CT angiogram of the head, 2-D echo, carotid Dopplers per neurology recommendations - Obtain lipid panel, hemoglobin A1c, PT, OT, speech evaluation - Will continue aspirin and Plavix (patient is on them outpatient), follow stroke service recommendations - Obtain ESR to rule out any GCA with her headache  Active Problems:   Coronary artery disease - Currently stable,  no chest pain or shortness of breath, continue telemetry - Continue aspirin, Plavix, Cardizem, beta blocker - Avoid hypotension, will add other antihypertensives slowly    Hypertension - Continue Cardizem and beta blocker     Hyperlipemia - Obtain lipid panel, continue Crestor    CHF (congestive heart failure) - Currently stable, compensated, obtain 2-D echo, avoid any fluid overload    Pacemaker: Due to history of second-degree heart block    Morbid obesity: - Patient advised for diet and weight control  DVT prophylaxis: Lovenox  CODE STATUS: Full CODE STATUS  Further plan will depend as patient's clinical course evolves and further radiologic and laboratory data become available.   Time Spent on Admission: 1 hour  RAI,RIPUDEEP M.D. [facility] 02/07/2013, 10:16 AM Pager: [phone]  If 7PM-7AM, please contact night-coverage [email or web address] Password [email or web address]

## 2013-02-07 NOTE — ED Notes (Signed)
Per EMS pt's BP was 198/102. EMS retook her BP when pt was in the ambulance and her BP was 147/82. Per EMS pt's chief complaint is a strong HA. Per EMS pt's neurologic assessment was normal. Per EMS Pt's vitals were BP: 147/82, HR: 70, RR: 18, 02: 96 on RA. Per EMS pt has a hx of Afib, HTN, and a TIA in December 2013. Per EMS pt has a pacemaker.

## 2013-02-07 NOTE — Consult Note (Signed)
NEURO HOSPITALIST CONSULT NOTE    Reason for Consult: TIA.  HPI:                                                                                                                                          [name] is an 72 y.o. female  With a complicated past medical history as delineated below, transferred from [place] ED for further evaluation and management of probable TIA. [name] stated that she went to bed around 11 pm last night feeling well, but woke up at 1 am with a severe, sharp headache located over the right side of the head. The headache was not associated with nausea, vomiting, visual disturbances, confusion, or focal numbness but was accompanied by difficult speaking that lasted for 2-3 hours and then completely resolved. She said that she also noticed " some weakness of the right hand". According to [name], she had a stroke last year that caused symptoms in the right side of her body but did not affect her ability to function. She presented to [place] where she had a brain CT scan that showed no acute abnormalities. Transferred to [facility]. Takes aspirin daily. Presently, she offers no neurological complains.  Past Medical History  Diagnosis Date  . Coronary artery disease     Cath 04/2006 normal coronaries, mild-mod slow flow in all coronaries with slow distall runoff, mild-mod LV enlargement, EF 55%   . Hypertension   . Hyperlipemia   . Arthritis   . CHF (congestive heart failure)     05/2011 echo severe concentric LVH, normal systolic fxn, grade 1 diastolic dysfxn, paradoxical septal motion  . Vertigo   . 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 Pacemaker     For second degree HB  . Hypercholesterolemia   . MI (myocardial infarction)   . Morbid obesity   . Angina   . Shortness of breath   . Sleep apnea   . Stroke   . GERD (gastroesophageal reflux  disease)   . Headache   . Dysrhythmia   . Peripheral vascular disease   . Pneumonia     Past Surgical History  Procedure Laterality Date  . Pacemaker insertion    . Pacemaker insertion    . Vascular surgery    . Cardiac catheterization    . Dilation and curettage of uterus    . Insert / replace / remove pacemaker    . Tubal ligation      No family history on file.   Social History:  reports that she has never smoked. She has never used smokeless tobacco. She reports that she does not drink alcohol or use illicit drugs.  Allergies  Allergen Reactions  . Ampicillin Other (See Comments)    Increases heart rhythm     MEDICATIONS:                                                                                                                     I have reviewed the patient's current medications.   ROS:                                                                                                                                       History obtained from the patient and chart review.  General ROS: negative for - chills, fatigue, fever, night sweats, weight gain or weight loss Psychological ROS: negative for - behavioral disorder, hallucinations, memory difficulties, mood swings or suicidal ideation Ophthalmic ROS: negative for - blurry vision, double vision, eye pain or loss of vision ENT ROS: negative for - epistaxis, nasal discharge, oral lesions, sore throat, tinnitus or vertigo Allergy and Immunology ROS: negative for - hives or itchy/watery eyes Hematological and Lymphatic ROS: negative for - bleeding problems, bruising or swollen lymph nodes Endocrine ROS: negative for - galactorrhea, hair pattern changes, polydipsia/polyuria or temperature intolerance Respiratory ROS: negative for - cough, hemoptysis, shortness of breath or wheezing Cardiovascular ROS: negative for - chest pain, dyspnea on exertion, edema or irregular heartbeat Gastrointestinal ROS: negative for -  abdominal pain, diarrhea, hematemesis, nausea/vomiting or stool incontinence Genito-Urinary ROS: negative for - dysuria, hematuria, incontinence or urinary frequency/urgency Musculoskeletal ROS: negative for - joint swelling Neurological ROS: as noted in HPI Dermatological ROS: negative for rash and skin lesion changes    Physical exam: pleasant female in no apparent distress. Blood pressure 169/95, pulse 69, temperature 98 F (36.7 C), temperature source Oral, resp. rate 18, height 5\' 6"  (1.676 m), weight 160.5 kg (353 lb 13.4 oz), SpO2 99.00%. Head: normocephalic. Neck: supple, no bruits, no JVD. Cardiac: no murmurs. Lungs: clear. Abdomen: soft, no tender, no mass. Extremities: no edema.   Neurologic Examination:  Mental Status: Alert, awake, oriented x 4, thought content appropriate. Comprehension, naming, and repetition intact. Speech fluent without evidence of aphasia.  Able to follow 3 step commands without difficulty. Cranial Nerves: II: Discs flat bilaterally; Visual fields grossly normal, pupils equal, round, reactive to light and accommodation III,IV, VI: ptosis not present, extra-ocular motions intact bilaterally V,VII: smile symmetric, facial light touch sensation normal bilaterally VIII: hearing normal bilaterally IX,X: gag reflex present XI: bilateral shoulder shrug XII: midline tongue extension Motor: Subtle right hand weakness? Sensory: Pinprick and light touch intact throughout, bilaterally Deep Tendon Reflexes: 2+ and symmetric throughout Plantars: Right: downgoing   Left: downgoing Cerebellar: normal finger-to-nose,  normal heel-to-shin test Gait: no tested. CV: pulses palpable throughout    Lab Results  Component Value Date/Time   CHOL 131 10/18/2012  5:00 AM    Results for orders placed during the hospital encounter of 02/07/13 (from the past 48  hour(s))  CBC WITH DIFFERENTIAL     Status: None   Collection Time    02/07/13  1:17 AM      Result Value Range   WBC 5.7  4.0 - 10.5 K/uL   RBC 4.24  3.87 - 5.11 MIL/uL   Hemoglobin 13.5  12.0 - 15.0 g/dL   HCT 40.4  36.0 - 46.0 %   MCV 95.3  78.0 - 100.0 fL   MCH 31.8  26.0 - 34.0 pg   MCHC 33.4  30.0 - 36.0 g/dL   RDW 14.6  11.5 - 15.5 %   Platelets 200  150 - 400 K/uL   Neutrophils Relative 56  43 - 77 %   Neutro Abs 3.2  1.7 - 7.7 K/uL   Lymphocytes Relative 30  12 - 46 %   Lymphs Abs 1.7  0.7 - 4.0 K/uL   Monocytes Relative 9  3 - 12 %   Monocytes Absolute 0.5  0.1 - 1.0 K/uL   Eosinophils Relative 5  0 - 5 %   Eosinophils Absolute 0.3  0.0 - 0.7 K/uL   Basophils Relative 1  0 - 1 %   Basophils Absolute 0.1  0.0 - 0.1 K/uL  COMPREHENSIVE METABOLIC PANEL     Status: Abnormal   Collection Time    02/07/13  1:17 AM      Result Value Range   Sodium 141  135 - 145 mEq/L   Potassium 3.7  3.5 - 5.1 mEq/L   Chloride 105  96 - 112 mEq/L   CO2 27  19 - 32 mEq/L   Glucose, Bld 132 (*) 70 - 99 mg/dL   BUN 21  6 - 23 mg/dL   Creatinine, Ser 1.30 (*) 0.50 - 1.10 mg/dL   Calcium 9.3  8.4 - 10.5 mg/dL   Total Protein 7.4  6.0 - 8.3 g/dL   Albumin 3.6  3.5 - 5.2 g/dL   AST 18  0 - 37 U/L   ALT 10  0 - 35 U/L   Alkaline Phosphatase 105  39 - 117 U/L   Total Bilirubin 0.2 (*) 0.3 - 1.2 mg/dL   GFR calc non Af Amer 41 (*) >90 mL/min   GFR calc Af Amer 47 (*) >90 mL/min   Comment:            The eGFR has been calculated     using the CKD EPI equation.     This calculation has not been     validated in all clinical     situations.  eGFR's persistently     <90 mL/min signify     possible Chronic Kidney Disease.  TROPONIN I     Status: None   Collection Time    02/07/13  1:17 AM      Result Value Range   Troponin I <0.30  <0.30 ng/mL   Comment:            Due to the release kinetics of cTnI,     a negative result within the first hours     of the onset of symptoms does  not rule out     myocardial infarction with certainty.     If myocardial infarction is still suspected,     repeat the test at appropriate intervals.  URINALYSIS, ROUTINE W REFLEX MICROSCOPIC     Status: None   Collection Time    02/07/13  2:03 AM      Result Value Range   Color, Urine YELLOW  YELLOW   APPearance CLEAR  CLEAR   Specific Gravity, Urine 1.017  1.005 - 1.030   pH 6.0  5.0 - 8.0   Glucose, UA NEGATIVE  NEGATIVE mg/dL   Hgb urine dipstick NEGATIVE  NEGATIVE   Bilirubin Urine NEGATIVE  NEGATIVE   Ketones, ur NEGATIVE  NEGATIVE mg/dL   Protein, ur NEGATIVE  NEGATIVE mg/dL   Urobilinogen, UA 0.2  0.0 - 1.0 mg/dL   Nitrite NEGATIVE  NEGATIVE   Leukocytes, UA NEGATIVE  NEGATIVE   Comment: MICROSCOPIC NOT DONE ON URINES WITH NEGATIVE PROTEIN, BLOOD, LEUKOCYTES, NITRITE, OR GLUCOSE <1000 mg/dL.    Dg Chest 2 View  02/07/2013  *RADIOLOGY REPORT*  Clinical Data: Headache.  CHEST - 2 VIEW  Comparison: Chest radiograph performed 10/17/2012  Findings: The lungs are well-aerated.  Mild vascular congestion is noted.  There is no evidence of focal opacification, pleural effusion or pneumothorax.  The heart is mildly enlarged.  A pacemaker is seen at the right chest wall, with leads ending at the right atrium and right ventricle.  No acute osseous abnormalities are seen.  IMPRESSION: Mild vascular congestion and mild cardiomegaly; lungs remain grossly clear.   Original Report Authenticated By: Santa Lighter, M.D.    Ct Head Wo Contrast  02/07/2013  *RADIOLOGY REPORT*  Clinical Data: Severe headache.  CT HEAD WITHOUT CONTRAST  Technique:  Contiguous axial images were obtained from the base of the skull through the vertex without contrast.  Comparison: CTA of the head performed 10/18/2012, and CT of the head performed 10/17/2012  Findings: There is no evidence of acute infarction, mass lesion, or intra- or extra-axial hemorrhage on CT.  Scattered periventricular and subcortical white matter  change likely reflects small vessel ischemic microangiopathy.  A chronic lacunar infarct is noted anterior to the frontal horn of the right lateral ventricle.  Mild hyperostosis frontalis interna is noted.  The posterior fossa, including the cerebellum, brainstem and fourth ventricle, is within normal limits.  The third and lateral ventricles, and basal ganglia are unremarkable in appearance.  No mass effect or midline shift is seen.  There is no evidence of fracture; visualized osseous structures are unremarkable in appearance.  The visualized portions of the orbits are within normal limits.  The paranasal sinuses and mastoid air cells are well-aerated.  No significant soft tissue abnormalities are seen.  IMPRESSION:  1.  No acute intracranial pathology seen on CT. 2.  Scattered small vessel ischemic microangiopathy; chronic lacunar infarct noted anterior to the frontal horn of the right lateral ventricle.  Original Report Authenticated By: Santa Lighter, M.D.      Assessment/Plan: 72 years old female with multiple risk factors for stroke and new onset HA with transient language impairment. Her neuro-exam is unimpressive except for questionable subtle right hand weakness. CT brain unremarkable but unfortunately can not have MRI due to pacemaker. She has no history of similar headaches and therefore complicated migraine is very unlikely. Will get CTA brain, carotid ultrasound, TTE. Continue aspirin for now. Stroke team will resume care and make further recommendations.  Dorian Pod, MD [facility] [phone]  02/07/2013, 6:40 AM

## 2013-02-07 NOTE — Evaluation (Signed)
Physical Therapy Evaluation Patient Details Name: [name] MRN: [id number] DOB: Aug 27, 1941 Today's Date: 02/07/2013 Time: [id number] PT Time Calculation (min): 15 min  PT Assessment / Plan / Recommendation Clinical Impression  Pt appears at baseline level of functioning and is mod I with gait and transfers.  Pt does not require acute care PT services at this time.    PT Assessment  Patent does not need any further PT services    Follow Up Recommendations  No PT follow up    Does the patient have the potential to tolerate intense rehabilitation      Barriers to Discharge        Equipment Recommendations       Recommendations for Other Services     Frequency      Precautions / Restrictions     Pertinent Vitals/Pain No c/o pain      Mobility  Transfers Transfers: Sit to Stand;Stand to Sit Sit to Stand: 6: Modified independent (Device/Increase time) Stand to Sit: 6: Modified independent (Device/Increase time) Details for Transfer Assistance: with RW Ambulation/Gait Ambulation/Gait Assistance: 6: Modified independent (Device/Increase time) Ambulation Distance (Feet): 50 Feet Assistive device: Rolling walker Ambulation/Gait Assistance Details: slow cadence, pt reports this is normal PTA due to knee arthritis.  No LOB.    Exercises     PT Diagnosis:    PT Problem List:   PT Treatment Interventions:     PT Goals    Visit Information  Last PT Received On: 02/07/13 Assistance Needed: +1    Subjective Data  Subjective: I feel ok Patient Stated Goal: go home   Prior Functioning  Home Living Lives With: Alone Available Help at Discharge: Family;Available PRN/intermittently Type of Home: House Home Access: Stairs to enter [employer] of Steps: 2 Home Layout: One level Home Adaptive Equipment: Walker - rolling;Bedside commode/3-in-1 Prior Function Level of Independence: Independent Able to Take Stairs?: Yes Driving:  Yes Communication Communication: No difficulties    Cognition  Cognition Arousal/Alertness: Awake/alert Behavior During Therapy: WFL for tasks assessed/performed Overall Cognitive Status: Within Functional Limits for tasks assessed    Extremity/Trunk Assessment Right Lower Extremity Assessment RLE ROM/Strength/Tone: Within functional levels RLE Sensation: WFL - Light Touch RLE Coordination: WFL - gross/fine motor Left Lower Extremity Assessment LLE ROM/Strength/Tone: Within functional levels LLE Sensation: WFL - Light Touch LLE Coordination: WFL - gross/fine motor Trunk Assessment Trunk Assessment: Normal (obese)   Balance Static Sitting Balance Static Sitting - Balance Support: Feet supported Static Sitting - Level of Assistance: 7: Independent Dynamic Standing Balance Dynamic Standing - Balance Support: Left upper extremity supported;During functional activity Dynamic Standing - Level of Assistance: 6: Modified independent (Device/Increase time)  End of Session PT - End of Session Equipment Utilized During Treatment: Gait belt Activity Tolerance: Patient tolerated treatment well Patient left: in bed;with nursing in room;with call bell/phone within reach Nurse Communication: Mobility status  GP Functional Assessment Tool Used: clinical judgement Functional Limitation: Other PT primary Other PT Primary Current Status [id number]): At least 1 percent but less than 20 percent impaired, limited or restricted Other PT Primary Goal Status [id number]): At least 1 percent but less than 20 percent impaired, limited or restricted Other PT Primary Discharge Status [phone]): At least 1 percent but less than 20 percent impaired, limited or restricted   [facility] 02/07/2013, 11:39 AM

## 2013-02-07 NOTE — Procedures (Signed)
History: 72 yo F with transient aphasia and right hand weakness.   Background: The background consists of intermixed beta and alpha frequencies. There is a well defined posterior dominant rhythm of 9 Hz that attenuates with eye opening. There is a mild increase in generalized delta activity associated with drowsiness that led into normal appearing sleep structures.    Photic stimulation: Physiologic driving is not performed.   EEG Abnormalities: none  Clinical Interpretation: This normal EEG is recorded in the waking and sleep state. There was no seizure or seizure predisposition recorded on this study.   Roland Rack, MD [facility] [phone]  If 7pm- 7am, please page neurology on call at [phone].

## 2013-02-07 NOTE — Progress Notes (Signed)
Occupational Therapy Evaluation Patient Details Name: [name] MRN: [id number] DOB: 1941/09/18 Today's Date: 02/07/2013 Time: [id number] OT Time Calculation (min): 13 min  OT Assessment / Plan / Recommendation Clinical Impression  Pt admitted with HA and right hand weakness. CVA work up underway.  Pt is overall mod I with mobility/ADLs.  Demonstrating with right hand weakness today but still [facility] for tasks assessed. Feel that RUE will improve back to baseline with continued repetitive use esp during ADL activity.   No OT f/u recommended at this time (May need OPOT recommended at f/u MD appts if RUE does not improve). Signing off.    OT Assessment  Patient does not need any further OT services    Follow Up Recommendations  No OT follow up;Supervision - Intermittent    Barriers to Discharge      Equipment Recommendations  None recommended by OT    Recommendations for Other Services    Frequency       Precautions / Restrictions     Pertinent Vitals/Pain See vitals    ADL  Grooming: Performed;Brushing hair;Wash/dry hands;Set up Where Assessed - Grooming: Unsupported sitting Upper Body Bathing: Simulated;Modified independent Where Assessed - Upper Body Bathing: Unsupported sitting Lower Body Bathing: Simulated;Modified independent Where Assessed - Lower Body Bathing: Unsupported sitting Upper Body Dressing: Simulated;Modified independent Where Assessed - Upper Body Dressing: Unsupported sitting Lower Body Dressing: Simulated;Modified independent Where Assessed - Lower Body Dressing: Unsupported sitting Toilet Transfer: Simulated;Modified independent Toilet Transfer Method: Sit to Loss adjuster, chartered:  (bed) Equipment Used: Rolling walker Transfers/Ambulation Related to ADLs: pt ambulated in room with RW at mod I level due to increased time which pt reports is baseline. ADL Comments: Pt with minimal R hand weakness but still WFL to perform ADLs at mod I  level. (Bil UEs generally weak at baseline). Educated pt on repetitive use of R hand and fine motor activities (digit-to-thumb tap, single digit lifts)in order to improve strength/coordination.    OT Diagnosis:    OT Problem List:   OT Treatment Interventions:     OT Goals    Visit Information  Last OT Received On: 02/07/13 Assistance Needed: +1    Subjective Data      Prior Functioning     Home Living Lives With: Alone Available Help at Discharge: Family;Available PRN/intermittently Type of Home: House Home Access: Stairs to enter [employer] of Steps: 2 Home Layout: One level Home Adaptive Equipment: Walker - rolling;Bedside commode/3-in-1 Prior Function Level of Independence: Independent with assistive device(s) Able to Take Stairs?: Yes Driving: Yes Communication Communication: No difficulties Dominant Hand: Right         Vision/Perception Vision - History Baseline Vision: No visual deficits   Cognition  Cognition Arousal/Alertness: Awake/alert Behavior During Therapy: WFL for tasks assessed/performed Overall Cognitive Status: Within Functional Limits for tasks assessed    Extremity/Trunk Assessment Right Upper Extremity Assessment RUE ROM/Strength/Tone: [facility] for tasks assessed;Deficits RUE ROM/Strength/Tone Deficits: 3+/5 RUE Sensation: WFL - Light Touch;WFL - Proprioception RUE Coordination: Deficits RUE Coordination Deficits: decreased smoothness/accuracy of fine motor Left Upper Extremity Assessment LUE ROM/Strength/Tone: WFL for tasks assessed (generally weak 3+/5 - 4/5) LUE Sensation: WFL - Light Touch;WFL - Proprioception LUE Coordination: WFL - gross/fine motor     Mobility Bed Mobility Bed Mobility: Not assessed Transfers Transfers: Sit to Stand;Stand to Sit Sit to Stand: 6: Modified independent (Device/Increase time);From bed Stand to Sit: 6: Modified independent (Device/Increase time);To bed     Exercise     Balance  End of Session OT - End of Session Activity Tolerance: Patient tolerated treatment well Patient left: in bed;with bed alarm set;with call bell/phone within reach  GO Functional Assessment Tool Used: clinical judgement Functional Limitation: Self care Self Care Current Status [id number]): 0 percent impaired, limited or restricted Self Care Goal Status [id number]): 0 percent impaired, limited or restricted Self Care Discharge Status [phone]): 0 percent impaired, limited or restricted  02/07/2013 Darrol Jump OTR/L Pager [phone] Office [phone]  Darrol Jump 02/07/2013, 3:50 PM

## 2013-02-08 DIAGNOSIS — G459 Transient cerebral ischemic attack, unspecified: Secondary | ICD-10-CM

## 2013-02-08 LAB — RAPID URINE DRUG SCREEN, HOSP PERFORMED
Benzodiazepines: NOT DETECTED
Cocaine: NOT DETECTED

## 2013-02-08 LAB — BASIC METABOLIC PANEL
BUN: 16 mg/dL (ref 6–23)
CO2: 26 mEq/L (ref 19–32)
Calcium: 9.1 mg/dL (ref 8.4–10.5)
Creatinine, Ser: 1.09 mg/dL (ref 0.50–1.10)
GFR calc Af Amer: 59 mL/min — ABNORMAL LOW (ref >90)
Glucose, Bld: 99 mg/dL (ref 70–99)

## 2013-02-08 MED ORDER — POLYETHYLENE GLYCOL 3350 17 G PO PACK
17.0000 g | PACK | Freq: Every day | ORAL | Status: DC
  Administered 2013-02-08: 17 g via ORAL
  Filled 2013-02-08: qty 1

## 2013-02-08 MED ORDER — POTASSIUM CHLORIDE CRYS ER 20 MEQ PO TBCR
40.0000 meq | EXTENDED_RELEASE_TABLET | Freq: Once | ORAL | Status: AC
  Administered 2013-02-08: 40 meq via ORAL
  Filled 2013-02-08: qty 2

## 2013-02-08 MED ORDER — LOSARTAN POTASSIUM 50 MG PO TABS: 100.0000 mg | ORAL_TABLET | Freq: Every day | ORAL | Status: DC

## 2013-02-08 MED ORDER — ISOSORBIDE MONONITRATE ER 60 MG PO TB24
60.0000 mg | ORAL_TABLET | Freq: Every day | ORAL | Status: DC
  Administered 2013-02-08: 60 mg via ORAL
  Filled 2013-02-08: qty 1

## 2013-02-08 MED ORDER — LOSARTAN POTASSIUM 50 MG PO TABS
100.0000 mg | ORAL_TABLET | Freq: Every day | ORAL | Status: DC
  Administered 2013-02-08: 100 mg via ORAL
  Filled 2013-02-08: qty 2

## 2013-02-08 NOTE — Care Management Note (Signed)
    Page 1 of 2   02/08/2013     3:31:59 PM   CARE MANAGEMENT NOTE 02/08/2013  Patient:  [name], [name]   Account Number:  [id number]  Date Initiated:  02/07/2013  Documentation initiated by:  Fuller Plan  Subjective/Objective Assessment:   admitted for TIA workup     Action/Plan:   PT/OT evals   Anticipated DC Date:  02/08/2013   Anticipated DC Plan:  [place]  CM consult      Choice offered to / List presented to:  C-1 Patient   DME arranged  Vassie Moselle      DME agency  Center arranged  [place]   Status of service:  Completed, signed off Medicare Important Message given?   (If response is "NO", the following Medicare IM given date fields will be blank) Date Medicare IM given:   Date Additional Medicare IM given:    Discharge Disposition:  [place]  Per UR Regulation:  Reviewed for med. necessity/level of care/duration of stay  If discussed at [place] of Stay Meetings, dates discussed:    Comments:  02/08/13 Received order for HHPT and [place]. Spoke with patient about HHC, she selected [facility] which she had worked with in the past. [employer] with [place] and set up [place] and [place].Contacted [facility] and requested rolling walker be delivered to patient's room. Fuller Plan RN, BSN, CCM

## 2013-02-08 NOTE — Progress Notes (Signed)
D/c instructions reviewed with pt, copy of instructions given to pt. Pt also given handouts of stroke/TIA education and discussed with pt, able to teach back. And discussed meds.  Pt will go home later this afternoon. Pt states daughter will be picking her up when she gets off work.

## 2013-02-08 NOTE — Discharge Summary (Signed)
Physician Discharge Summary  [name] MRN: [id number] DOB/AGE: [birth year] 72 y.o.  Admit date: 02/07/2013 Discharge date: 02/08/2013  Primary Care Physician:  Salena Saner., MD  Discharge Diagnoses:    . TIA (transient ischemic attack) . Coronary artery disease . Hypertension . Hyperlipemia . CHF (congestive heart failure) . Pacemaker . Morbid obesity  Consults:  Neurology/stroke service  Discharge Medications:   Medication List    TAKE these medications       aspirin 81 MG chewable tablet  Chew 1 tablet (81 mg total) by mouth daily.     clopidogrel 75 MG tablet  Commonly known as:  PLAVIX  Take 75 mg by mouth daily.     diltiazem 360 MG 24 hr capsule  Commonly known as:  CARDIZEM CD  Take 360 mg by mouth daily.     furosemide 40 MG tablet  Commonly known as:  LASIX  Take 0.5 tablets (20 mg total) by mouth daily. For fluid     hydrALAZINE 25 MG tablet  Commonly known as:  APRESOLINE  Take 25 mg by mouth every 8 (eight) hours.     HYDROcodone-acetaminophen 5-325 MG per tablet  Commonly known as:  NORCO/VICODIN  Take 1 tablet by mouth every 6 (six) hours as needed. For pain     isosorbide mononitrate 60 MG 24 hr tablet  Commonly known as:  IMDUR  Take 60 mg by mouth daily.     losartan 100 MG tablet  Commonly known as:  COZAAR  Take 100 mg by mouth daily.     meclizine 25 MG tablet  Commonly known as:  ANTIVERT  Take 12.5-25 mg by mouth 3 (three) times daily as needed. For dizziness     metoCLOPramide 10 MG tablet  Commonly known as:  REGLAN  Take 10 mg by mouth every 6 (six) hours as needed. For indigestion     metoprolol succinate 100 MG 24 hr tablet  Commonly known as:  TOPROL-XL  Take 100 mg by mouth 2 (two) times daily.     nitroGLYCERIN 0.4 MG SL tablet  Commonly known as:  NITROSTAT  Place 0.4 mg under the tongue every 5 (five) minutes as needed for chest pain.     oxybutynin 5 MG tablet  Commonly known as:   DITROPAN  Take 5 mg by mouth 2 (two) times daily.     pantoprazole 40 MG tablet  Commonly known as:  PROTONIX  Take 40 mg by mouth daily.     potassium chloride SA 20 MEQ tablet  Commonly known as:  K-DUR,KLOR-CON  Take 20 mEq by mouth daily.     rosuvastatin 10 MG tablet  Commonly known as:  CRESTOR  Take 10 mg by mouth daily.     traMADol 50 MG tablet  Commonly known as:  ULTRAM  Take 50 mg by mouth 2 (two) times daily as needed. For pain     Vitamin D 2000 UNITS tablet  Take 2,000 Units by mouth daily.         Brief H and P: For complete details please refer to admission H and P, but in brief Patient is a 72 year old female with history of coronary disease, hypertension, hyperlipidemia, history of prior TIA/CVA, pacemaker for second-degree heart block presented to [facility] ED with headache and dysarthria, right hand numbness at night before the admission . History was obtained from the patient who stated that she went to bed around 11 PM but then woke up again around  1 AM and had severe right frontal headache, 9/10 in intensity sharp. She also noticed numbness in her right hand and difficulty speaking. Patient is on aspirin and Plavix prior to the admission.    Hospital Course:  TIA (transient ischemic attack): Multiple risk factors including hypertension, hyperlipidemia, prior CVA, coronary artery disease, CHF. Patient was admitted for full CVA workup. CT head was negative for acute stroke. Unfortunately cannot have MRI due to pacemaker  - CT angiogram of the head showed no acute stroke or hemorrhage, no intracranial vascular occlusion. - 2-D echo showed EF of 50-55%, mild hypokinesis of the mid anteroseptal myocardium. This was discussed with the patient in detail and she will followup with her cardiologist, Dr. Doylene Canard and outpatient stress test was recommended. -  carotid Dopplers showed no evidence of hemodynamically significant internal carotid artery stenosis - Patient was  continued on aspirin and Plavix. LDL 60, cholesterol 125 - ESR to rule out any GCA with her headache was checked and was 21 - EEG was done which was normal.  Coronary artery disease  - Currently stable, no chest pain or shortness of breath, continue telemetry  - Continue aspirin, Plavix, Cardizem, beta blocker   Hypertension - Continue outpatient antihypertensives Hyperlipemia - LDL 60, cholesterol 125, continue Crestor  CHF (congestive heart failure) - Currently stable, compensated  Pacemaker: Due to history of second-degree heart block   Morbid obesity:  - Patient advised for diet and weight control    Day of Discharge BP 151/86  Pulse 69  Temp(Src) 97.9 F (36.6 C) (Oral)  Resp 20  Ht 5\' 6"  (1.676 m)  Wt 160.5 kg (353 lb 13.4 oz)  BMI 57.14 kg/m2  SpO2 96%  Physical Exam: General: Alert and awake oriented x3 not in any acute distress. CVS: S1-S2 clear no murmur rubs or gallops Chest: clear to auscultation bilaterally, no wheezing rales or rhonchi Abdomen: soft nontender, nondistended, normal bowel sounds Extremities: no cyanosis, clubbing or edema noted bilaterally Neuro: Cranial nerves II-XII intact, no focal neurological deficits   The results of significant diagnostics from this hospitalization (including imaging, microbiology, ancillary and laboratory) are listed below for reference.    LAB RESULTS: Basic Metabolic Panel:  Recent Labs Lab 02/07/13 0117 02/07/13 0812 02/08/13 0508  NA 141  --  140  K 3.7  --  3.4*  CL 105  --  106  CO2 27  --  26  GLUCOSE 132*  --  99  BUN 21  --  16  CREATININE 1.30* 1.10 1.09  CALCIUM 9.3  --  9.1   Liver Function Tests:  Recent Labs Lab 02/07/13 0117  AST 18  ALT 10  ALKPHOS 105  BILITOT 0.2*  PROT 7.4  ALBUMIN 3.6   No results found for this basename: LIPASE, AMYLASE,  in the last 168 hours No results found for this basename: AMMONIA,  in the last 168 hours CBC:  Recent Labs Lab 02/07/13 0117  02/07/13 0812  WBC 5.7 5.9  NEUTROABS 3.2  --   HGB 13.5 12.4  HCT 40.4 37.0  MCV 95.3 91.8  PLT 200 201   Cardiac Enzymes:  Recent Labs Lab 02/07/13 0117  TROPONINI <0.30   BNP: No components found with this basename: POCBNP,  CBG:  Recent Labs Lab 02/07/13 0702  GLUCAP 111*    Significant Diagnostic Studies:  Ct Angio Head W/cm &/or Wo Cm  02/07/2013  *RADIOLOGY REPORT*  Clinical Data:  Transient ischemic attack, with dysarthria and right hand  numbness. Now resolved.  CT ANGIOGRAPHY HEAD  Technique:  Multidetector CT imaging of the head was performed using the standard protocol during bolus administration of intravenous contrast.  Multiplanar CT image reconstructions including MIPs were obtained to evaluate the vascular anatomy.  Contrast: 25mL OMNIPAQUE IOHEXOL 350 MG/ML SOLN  Comparison:   None.  Findings:  No acute cortical infarct or hemorrhage is identified. There is no extra-axial fluid.  Mild atrophy.  Moderate chronic microvascular ischemic change.  Remote infarct right paramedian genu corpus callosum,  ACA territory.  No worrisome osseous lesions.  Internal carotid arteries patent with only mild calcific atheromatous change.  Moderate bilateral carotid ectasia. Bilateral patent anterior cerebral arteries, left dominant.  Normal anterior communicating artery.  Distal ACAs patent.  Normal MCA  origins bilaterally.  No proximal MCA stenosis.  No MCA branch occlusion.  Bilateral posterior cerebral arteries widely patent.  Rudimentary connection of both PCAs with the basilar. Right vertebral is dominant with left vertebral functionally occluded.  No cerebellar branch occlusion.  No intracranial aneurysm.  Hypoplastic basilar relates to fetal PCA origins.   Review of the MIP images confirms the above findings.  IMPRESSION: No acute stroke or hemorrhage.  No intracranial vascular occlusion.  Similar appearance to prior CTA 10/18/2012.   Original Report Authenticated By: Rolla Flatten,  M.D.    Dg Chest 2 View  02/07/2013  *RADIOLOGY REPORT*  Clinical Data: Headache.  CHEST - 2 VIEW  Comparison: Chest radiograph performed 10/17/2012  Findings: The lungs are well-aerated.  Mild vascular congestion is noted.  There is no evidence of focal opacification, pleural effusion or pneumothorax.  The heart is mildly enlarged.  A pacemaker is seen at the right chest wall, with leads ending at the right atrium and right ventricle.  No acute osseous abnormalities are seen.  IMPRESSION: Mild vascular congestion and mild cardiomegaly; lungs remain grossly clear.   Original Report Authenticated By: Santa Lighter, M.D.    Ct Head Wo Contrast  02/07/2013  *RADIOLOGY REPORT*  Clinical Data: Hypertensive hyperlipidemic patient presenting with right-sided headache accompanying by difficulty with speech lasting for 2-3 hours and completely resolve.  The patient noted some weakness and right hand.  Prior stroke with right-sided body weakness.  Pacemaker.  CT HEAD WITHOUT CONTRAST  Technique:  Contiguous axial images were obtained from the base of the skull through the vertex without contrast.at the Circle of Willis.  Comparison: 02/07/2013 CT.  Findings: The present examination was to be a CT angiogram however, contrast bolus (100 ml Omnipaque 350) was missed by the scanner and CT angiogram not able to be performed.  Precontrast CT scan with results as discussed below.  Dr. Tana Coast was informed of the situation.  The patient will be hydrated and CT angiogram will be reattempted in 8-10 hours.  No intracranial hemorrhage.  Artifact through the right temporal lobe.  Small vessel disease type changes.  These findings limit detection of small acute infarct.  No CT evidence of large acute infarct.  No intracranial mass lesion detected on this unenhanced exam.  Remote infarct genu right corpus callosum.  No intracranial mass lesion detected on this unenhanced exam.  No hydrocephalus.  Vascular calcifications.  Exophthalmos.   IMPRESSION: No intracranial hemorrhage or CT evidence of large acute infarct.  Remote infarcts and small vessel disease type changes as detailed above.   Original Report Authenticated By: Genia Del, M.D.    Ct Head Wo Contrast  02/07/2013  *RADIOLOGY REPORT*  Clinical Data: Severe headache.  CT  HEAD WITHOUT CONTRAST  Technique:  Contiguous axial images were obtained from the base of the skull through the vertex without contrast.  Comparison: CTA of the head performed 10/18/2012, and CT of the head performed 10/17/2012  Findings: There is no evidence of acute infarction, mass lesion, or intra- or extra-axial hemorrhage on CT.  Scattered periventricular and subcortical white matter change likely reflects small vessel ischemic microangiopathy.  A chronic lacunar infarct is noted anterior to the frontal horn of the right lateral ventricle.  Mild hyperostosis frontalis interna is noted.  The posterior fossa, including the cerebellum, brainstem and fourth ventricle, is within normal limits.  The third and lateral ventricles, and basal ganglia are unremarkable in appearance.  No mass effect or midline shift is seen.  There is no evidence of fracture; visualized osseous structures are unremarkable in appearance.  The visualized portions of the orbits are within normal limits.  The paranasal sinuses and mastoid air cells are well-aerated.  No significant soft tissue abnormalities are seen.  IMPRESSION:  1.  No acute intracranial pathology seen on CT. 2.  Scattered small vessel ischemic microangiopathy; chronic lacunar infarct noted anterior to the frontal horn of the right lateral ventricle.   Original Report Authenticated By: Santa Lighter, M.D.     2D ECHO: Study Conclusions  - Left ventricle: The cavity size was normal. There was moderate concentric hypertrophy. Systolic function was preserved. The estimated ejection fraction was in the range of 50% to 55%. There is mild hypokinesis of the midanteroseptal  myocardium. 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 - Left atrium: The atrium was mildly dilated   Disposition and Follow-up:     Discharge Orders   Future Orders Complete By Expires     Diet - low sodium heart healthy  As directed     Increase activity slowly  As directed         DISPOSITION: Home with home PT DIET: Heart healthy diet ACTIVITY: As tolerated  DISCHARGE FOLLOW-UP Follow-up Information   Follow up with Salena Saner., MD. Schedule an appointment as soon as possible for a visit in 2 weeks.   Contact information:   [place] [address] [phone]       Follow up with [facility], MD. Schedule an appointment as soon as possible for a visit in 2 weeks.   Contact information:   [address] [place] [address] [phone]       Follow up with Forbes Cellar, MD. Schedule an appointment as soon as possible for a visit in 2 months. (for follow-up)    Contact information:   [address] [place] [address] [phone]       Time spent on Discharge: 36 mins  Signed:   RAI,RIPUDEEP M.D. [facility] 02/08/2013, 12:19 PM Pager: [phone]

## 2013-02-08 NOTE — Progress Notes (Signed)
Stroke Team Progress Note  HISTORY [name] is an 72 y.o. female With a complicated past medical history as delineated below, transferred from [facility] ED for further evaluation and management of probable TIA.  [name] stated that she went to bed around 11 pm last 02/06/2013 night feeling well, but woke up at 1 am with a severe, sharp headache located over the right side of the head. The headache was not associated with nausea, vomiting, visual disturbances, confusion, or focal numbness but was accompanied by difficult speaking that lasted for 2-3 hours and then completely resolved. She said that she also noticed " some weakness of the right hand". According to [name], she had a stroke last year that caused symptoms in the right side of her body but did not affect her ability to function. She presented to [facility] where she had a brain CT scan that showed no acute abnormalities. Transferred to [facility]. Takes aspirin daily. Presently, she offers no neurological complains. Patient was not a TPA candidate secondary to delay in arrival. She was admitted for further evaluation and treatment.  SUBJECTIVE No family  is at the bedside.  Overall she feels her condition is unchanged. Hand still numb, still with headache. Denies weakness.  OBJECTIVE Most recent Vital Signs: Filed Vitals:   02/07/13 2108 02/08/13 0041 02/08/13 0613 02/08/13 1045  BP: 146/74 148/76 161/97 151/86  Pulse: 70 70 70 69  Temp: 98.4 F (36.9 C) 98.3 F (36.8 C) 98.1 F (36.7 C) 97.9 F (36.6 C)  TempSrc: Oral Oral Oral Oral  Resp: 18 20 20 20   Height:      Weight:      SpO2: 98% 97% 95% 96%   CBG (last 3)   Recent Labs  02/07/13 0702  GLUCAP 111*    IV Fluid Intake:     MEDICATIONS  . aspirin  81 mg Oral Daily  . atorvastatin  20 mg Oral q1800  . cholecalciferol  2,000 Units Oral Daily  . clopidogrel  75 mg Oral Q breakfast  . diltiazem  360 mg Oral Daily  . enoxaparin  (LOVENOX) injection  80 mg Subcutaneous Q24H  . isosorbide mononitrate  60 mg Oral Daily  . losartan  100 mg Oral Daily  . metoprolol succinate  100 mg Oral BID  . oxybutynin  5 mg Oral BID  . pantoprazole  40 mg Oral Daily  . pneumococcal 23 valent vaccine  0.5 mL Intramuscular Tomorrow-1000  . polyethylene glycol  17 g Oral Daily   PRN:  acetaminophen, HYDROcodone-acetaminophen, meclizine, traMADol  Diet:  Cardiac thin liquids Activity:  OOB with assistance DVT Prophylaxis:  Lovenox 80 mg sq daily   CLINICALLY SIGNIFICANT STUDIES Basic Metabolic Panel:  Recent Labs Lab 02/07/13 0117 02/07/13 0812 02/08/13 0508  NA 141  --  140  K 3.7  --  3.4*  CL 105  --  106  CO2 27  --  26  GLUCOSE 132*  --  99  BUN 21  --  16  CREATININE 1.30* 1.10 1.09  CALCIUM 9.3  --  9.1   Liver Function Tests:  Recent Labs Lab 02/07/13 0117  AST 18  ALT 10  ALKPHOS 105  BILITOT 0.2*  PROT 7.4  ALBUMIN 3.6   CBC:  Recent Labs Lab 02/07/13 0117 02/07/13 0812  WBC 5.7 5.9  NEUTROABS 3.2  --   HGB 13.5 12.4  HCT 40.4 37.0  MCV 95.3 91.8  PLT 200 201  Coagulation: No results found for this basename: LABPROT, INR,  in the last 168 hours Cardiac Enzymes:  Recent Labs Lab 02/07/13 0117  TROPONINI <0.30   Urinalysis:  Recent Labs Lab 02/07/13 0203  COLORURINE YELLOW  LABSPEC 1.017  PHURINE 6.0  GLUCOSEU NEGATIVE  HGBUR NEGATIVE  BILIRUBINUR NEGATIVE  KETONESUR NEGATIVE  PROTEINUR NEGATIVE  UROBILINOGEN 0.2  NITRITE NEGATIVE  LEUKOCYTESUR NEGATIVE   Lipid Panel    Component Value Date/Time   CHOL 125 02/07/2013 0812   TRIG 78 02/07/2013 0812   HDL 49 02/07/2013 0812   CHOLHDL 2.6 02/07/2013 0812   VLDL 16 02/07/2013 0812   LDLCALC 60 02/07/2013 0812   HgbA1C  Lab Results  Component Value Date   HGBA1C 5.8* 02/07/2013    Urine Drug Screen:     Component Value Date/Time   LABOPIA POSITIVE* 02/08/2013 0611   COCAINSCRNUR NONE DETECTED 02/08/2013 0611   LABBENZ NONE  DETECTED 02/08/2013 0611   AMPHETMU NONE DETECTED 02/08/2013 0611   THCU NONE DETECTED 02/08/2013 0611   LABBARB NONE DETECTED 02/08/2013 0611    Alcohol Level: No results found for this basename: ETH,  in the last 168 hours   CT of the brain   02/07/2013  No intracranial hemorrhage or CT evidence of large acute infarct.  Remote infarcts and small vessel disease type changes 02/07/2013   1.  No acute intracranial pathology seen on CT. 2.  Scattered small vessel ischemic microangiopathy; chronic lacunar infarct noted anterior to the frontal horn of the right lateral ventricle.     CT Angio Head 02/07/2013   No acute stroke or hemorrhage.  No intracranial vascular occlusion.  Similar appearance to prior CTA 10/18/2012.     MRI/A of the brain  Too large  2D Echocardiogram  EF 50-55% with no source of embolus.  Carotid Doppler  No evidence of hemodynamically significant internal carotid artery stenosis. Vertebral artery flow is antegrade.   CXR  02/07/2013  Mild vascular congestion and mild cardiomegaly; lungs remain grossly clear.  Therapy Recommendations no therapy needs  Physical Exam  General: The patient is alert and cooperative at the time of the examination. The patient is markedly obese.  Skin: No significant peripheral edema is noted.   Neurologic Exam  Cranial nerves: Facial symmetry is present. Speech is normal, no aphasia or dysarthria is noted. Extraocular movements are full. Visual fields are full.  Motor: The patient has good strength in all 4 extremities, with the exception that there is trace weakness of the upper right arm.  Coordination: The patient has good finger-nose-finger and heel-to-shin bilaterally.  Gait and station: The gait was not tested. The patient has minimal right arm drift.  Reflexes: Deep tendon reflexes are symmetric, but are depressed.    ASSESSMENT [name] is a 72 y.o. female presenting with headache and right side weakness.  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 Suspect left brain stroke not confirmed with MRI.  On aspirin 81 mg orally every day and clopidogrel 75 mg orally every day prior to admission. Now on aspirin 81 mg orally every day and clopidogrel 75 mg orally every day for secondary stroke prevention. Patient with no resultant neuro deficits. Work up completed.  Hospital day # 1  TREATMENT/PLAN  Continue aspirin 81 mg orally every day and clopidogrel 75 mg orally every day for secondary stroke prevention.  Agree with plans for discharge today No further stroke workup indicated. Patient has a 10-15% risk of having another stroke  over the next year, the highest risk is within 2 weeks of the most recent stroke/TIA (risk of having a stroke following a stroke or TIA is the same). Ongoing risk factor control by Primary Care Physician Stroke Service will sign off. Please call should any needs arise. Follow up with Dr. Leonie Man, [facility], in 2 months.   Burnetta Sabin, MSN, RN, ANVP-BC, ANP-BC, [facility] Pager: [phone] 02/08/2013 11:40 AM  I have personally obtained a history, examined the patient, evaluated imaging results, and formulated the assessment and plan of care. I agree with the above. Lenor Coffin

## 2013-03-07 ENCOUNTER — Other Ambulatory Visit: Payer: Self-pay | Admitting: Internal Medicine

## 2013-03-21 ENCOUNTER — Ambulatory Visit: Payer: Medicare Other | Attending: Internal Medicine | Admitting: Physical Therapy

## 2013-03-21 DIAGNOSIS — M545 Low back pain, unspecified: Secondary | ICD-10-CM | POA: Insufficient documentation

## 2013-03-21 DIAGNOSIS — M25569 Pain in unspecified knee: Secondary | ICD-10-CM | POA: Insufficient documentation

## 2013-03-21 DIAGNOSIS — R262 Difficulty in walking, not elsewhere classified: Secondary | ICD-10-CM | POA: Insufficient documentation

## 2013-03-21 DIAGNOSIS — IMO0001 Reserved for inherently not codable concepts without codable children: Secondary | ICD-10-CM | POA: Insufficient documentation

## 2013-03-29 ENCOUNTER — Ambulatory Visit: Payer: Medicare Other | Attending: Internal Medicine | Admitting: Physical Therapy

## 2013-03-29 DIAGNOSIS — M25569 Pain in unspecified knee: Secondary | ICD-10-CM | POA: Insufficient documentation

## 2013-03-29 DIAGNOSIS — R262 Difficulty in walking, not elsewhere classified: Secondary | ICD-10-CM | POA: Insufficient documentation

## 2013-03-29 DIAGNOSIS — M545 Low back pain, unspecified: Secondary | ICD-10-CM | POA: Insufficient documentation

## 2013-03-29 DIAGNOSIS — IMO0001 Reserved for inherently not codable concepts without codable children: Secondary | ICD-10-CM | POA: Insufficient documentation

## 2013-04-05 ENCOUNTER — Ambulatory Visit: Payer: Medicare Other | Admitting: Physical Therapy

## 2013-04-11 ENCOUNTER — Ambulatory Visit: Payer: Medicare Other | Admitting: Physical Therapy

## 2013-04-17 ENCOUNTER — Ambulatory Visit: Payer: Medicare Other | Admitting: Rehabilitation

## 2013-04-25 ENCOUNTER — Ambulatory Visit: Payer: Medicare Other | Attending: Internal Medicine | Admitting: Physical Therapy

## 2013-04-25 DIAGNOSIS — IMO0001 Reserved for inherently not codable concepts without codable children: Secondary | ICD-10-CM | POA: Insufficient documentation

## 2013-04-25 DIAGNOSIS — R262 Difficulty in walking, not elsewhere classified: Secondary | ICD-10-CM | POA: Insufficient documentation

## 2013-04-25 DIAGNOSIS — M25569 Pain in unspecified knee: Secondary | ICD-10-CM | POA: Insufficient documentation

## 2013-04-25 DIAGNOSIS — M545 Low back pain, unspecified: Secondary | ICD-10-CM | POA: Insufficient documentation

## 2013-04-30 ENCOUNTER — Ambulatory Visit: Payer: Medicare Other | Admitting: Physical Therapy

## 2013-05-01 ENCOUNTER — Other Ambulatory Visit: Payer: Self-pay | Admitting: Gastroenterology

## 2013-05-02 ENCOUNTER — Ambulatory Visit: Payer: Medicare Other | Admitting: Rehabilitation

## 2013-05-10 ENCOUNTER — Ambulatory Visit: Payer: Medicare Other | Admitting: Physical Therapy

## 2013-05-16 ENCOUNTER — Ambulatory Visit: Payer: Medicare Other | Admitting: Physical Therapy

## 2013-05-23 ENCOUNTER — Ambulatory Visit: Payer: Medicare Other | Admitting: Physical Therapy

## 2013-05-30 ENCOUNTER — Ambulatory Visit: Payer: Medicare Other | Admitting: Physical Therapy

## 2013-08-03 ENCOUNTER — Other Ambulatory Visit: Payer: Self-pay | Admitting: Ophthalmology

## 2013-08-03 NOTE — H&P (Signed)
Patient Record  [name], COLESON Patient Number:  [id number] Date of Birth:  [birth year] Age:  72 years old    Gender:  Female Date of Evaluation:  August 03, 2013  Chief Complaint:   72 year old female c/o sticking in the mornings, tearing, itching History of Present Illness:   72 yo female for follow up conjunctivitis and catract.  Has been on tobradex.  On no meds.  No longer has burning and itching.  Lids do not sticktogether. Has some mucus in the AM.  Presents today for follow up evaluation. Past History:  Allergies:  ampicillin (unspecified), Active Medications:   Other Medications:  clopidogrel (clopidogrel) tablet 75 mg 1 tablet by mouth once a day tablet, Benicar (olmesartan) tablet 40 mg 1 tablet by mouth once a day tablet, hydrocodone-acetaminophen (hydrocodone-acetaminophen) tablet 5-325 mg 1 tablet by mouth every six hours as needed tablet, meclizine (meclizine) tablet 25 mg 1 tablet by mouth three times a day tablet, hydralazine (hydralazine) tablet 25 mg 1 tablet by mouth three times a day tablet, Vitamin D3 (cholecalciferol (vitamin d3)) capsule 2,000 unit 1 capsule by mouth once a day capsule, aspirin (aspirin) tablet,delayed release (DR/EC) 81 mg 1 tablet by mouth once a day tablet, ginkgo biloba (ginkgo biloba) tablet 120 mg, Crestor (rosuvastatin) tablet 10 mg 1 tablet by mouth once a day tablet, metoclopramide HCl (metoclopramide hcl) tablet 10 mg 1 tablet by mouth every six hours as needed tablet, metoprolol succinate (metoprolol succinate) tablet extended release 24 hr 100 mg 1 tablet by mouth twice a day tablet, isosorbide mononitrate (isosorbide mononitrate) tablet extended release 24 hr 60 mg 1 tablet by mouth once a day tablet, tramadol (tramadol) tablet 50 mg 1 tablet by mouth twice a day tablet, diltiazem HCl (diltiazem hcl) tablet extended release 24 hr 360 mg 1 tablet by mouth once a day tablet, pantoprazole (pantoprazole) tablet,delayed release (DR/EC) 40 mg 1  tablet by mouth once a day tablet, fluticasone (fluticasone) Spray, Suspension 50 mcg/actuation 2 spray into both nostrils once a day Birth History:  none Past Ocular History:  none Past Medical History:   Hypertension, Elevated Cholesterol Past Surgical History:  none Family History:  no amblyopia, no blindness, no cataracts, + crossed eyes (sister), no diabetic retinopathy, no glaucoma, no macular degeneration, no retinal detachment, + cancer (father), + diabetes (sister), + heart disease (uncle, grandmother), + high blood pressure (brother, sister, mother), + stroke (mother) Social History:  Smoking Status: never smoker  Alcohol:  none  Driving status:  driving  Marital status:  widowed Review of Systems:   Constitutional:  no fever, no weight loss    Eyes: + decreased vision, + blurred vision  Ear/Nose/Throat: + sinus problems  Cardiovascular:  + high blood pressure, + high cholesterol  Respiratory: + shortness of breath   Gastrointestinal: + nausea  Genitourinary:  no blood in urine, no discomfort    Musculoskeletal: + joint pain, + arthritis  Integumentary skin/breast:  no rashes, no skin tumors    Neurological: + numbness  Psychiatric:  no anxiety, no depression    Endocrine:  no heat intolerance, no thyroid problems    Hematologic/Lymphatic:  no anemia, no unusual bleeding    Allergic/Immunologic: + seasonal allergies  Examination:  Visual Acuity:   Distance VA cc:  OD: 20/25-1    OS: 20/25  Distance VA [place]:  OD: 20/30-1    OS: 20/70 IOP:  OD:  12     OS:  14    @  10:14AM (Goldmann applanation) Manifest Refraction:    Sphere    Cyl Axis       VA         Add       VA Prism Base R:  +0.75  -0.50  170    20/25      +2.[name] Kitchen50                      L:  -0.25  -0.75  120    20/60       +2.50                        Confrontation visual field: OU:  Normal  Motility: OU:  Normal  Pupils: OU:  Shape, size, direct and consensual reaction normal  Adnexa:  Preauricular LN, lacrimal  drainage, lacrimal glands, orbit normal  Eyelids: Eyelids:  normal Conjunctiva: OU:  Papillary reaction, allergic ,   P2  Cornea: OU:  epithelium, stroma, endothelium, tear film normal  Anterior Chamber: OU:  depth normal, no cell, no flare, 3+ deep / clear  Iris: OU:  normal Dilation:  OU: AK-Dilate, Tropicacyl @ 10:07AM  Lens: OD:  2+ nuclear sclerotic cataract OS:  3+ nuclear sclerotic cataract,,  2+ cortical cataract, 1+ posterior subcapsular cataract  Vitreous: OU:  clear, with PVD  Optic Disc: OU:  cupping: 0.2   Macula: OU:  normal  Vessels: OU:  normal  Periphery: OU:  normal A Scan / IOLMaster:  Predicts  +19.50  Acrysof MA50 BM PC IOL for emmetropia OS  Orientation to person, place and time:  Normal  Mood and affect:  Normal  Impression:  366.19  Combined Cataract OU: NSC, Cortical, 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 We discussed the related symptoms , visual significance and when we intervene with surgery. We discussed the surgical techniques used, risks and benefits of surgery.  Her Cataract OS is visually significant and I recommend Phaco IOL OS. Risks benefits and alternatives wree discussed.  She indicates that she desires to proceed with the recommended treatment/care plan.  She indicated understanding our discussion and felt that her questions had been answered to her satisfaction.   Patient Instructions: Please do not eat anything after mignight the day before surgery. Return to clinic:  November 6th , 2014 4:45 for post-operative follow-up  Schedule:  Phacoemulsification, Posterior Chamber Intraocular Lens OS x 08/29/2013   (electronically signed)  Adonis Brook, MD

## 2013-08-17 ENCOUNTER — Other Ambulatory Visit: Payer: Self-pay | Admitting: Ophthalmology

## 2013-08-17 NOTE — H&P (Deleted)
Patient Record  [name], [name]  Patient Number:  [id number] Date of Birth:  October 25, 2072 Age:  72 years old    Gender:  female Date of Evaluation:  August 17, 2013  Chief Complaint:   72 year old female patient for c/o pain in both eyes when he blinks History of Present Illness:   72 yo female c/o blurred vision.  Does not see well for distance or near.  Also has pain and fb sensation ou.  Patient is non English speaiking [place] Glad.  Has an interpreter with him.  Presents for evaluation. Past History:  Allergies:  NKDA Medications:  none Birth History:  none Past Ocular History:   Cataracts Past Medical History:  none Past Surgical History:  none Family History:  no amblyopia, no blindness, no cataracts, no crossed eyes, no diabetic retinopathy, no glaucoma, no macular degeneration, no retinal detachment, no cancer, + diabetes (daughter), no heart disease, + high blood pressure (daughter), no stroke Social History:   Smoking Status: never smoker  Alcohol:  none   Driving status:  driving Marital status:  married Living arrangements:  with spouse Review of Systems:   Constitutional: + fever, + weight loss  Eyes: + blurred vision  Ear/Nose/Throat: + hearing loss, + sinus problems  Cardiovascular:  + chest pain  Respiratory: + shortness of breath  Gastrointestinal: + abdominal pain, + nausea  Genitourinary: + discomfort  Musculoskeletal: + joint pain, + low back pain  Integumentary skin/breast:  no rashes, no skin tumors    Neurological: + numbness, + weakness  Psychiatric:  no anxiety, no depression    Endocrine:  no heat intolerance, no thyroid problems    Hematologic/Lymphatic:  no anemia, no unusual bleeding    Allergic/Immunologic:  no hives, no seasonal allergies    All other systems are negative.  Examination:  Visual Acuity:   Distance VA [place]:  OD: 20/100-1    OS: 20/100-1 IOP:  OD:  12     OS:  12    @ 03:30PM (Goldmann applanation) Manifest Refraction:    Sphere    Cyl Axis        VA         Add       VA Prism Base R:  +2.50  -3.50   90   20/100                                  L:  +2.50  -3.25   90   20/100                                    Confrontation visual field:  OU:  Normal  Motility: OU:  Normal  Pupils: OU:  Shape, size, direct and consensual reaction normal  Adnexa:  Preauricular LN, lacrimal drainage, lacrimal glands, orbit normal  Eyelids: Eyelids:  normal Conjunctiva: OU:  bulbar, palpebral normal  Cornea: OU:  epithelium, stroma, endothelium, tear film normal  Anterior Chamber: OU:  depth normal, no cell, no flare  Iris: OU:  normal Dilation:  OU: Tropicamide 1%/ N 2.5% @ 02:29PM  Lens: OU:  3+ nuclear sclerosis, 2+ cortical cataract  Vitreous: OU:  normal  Optic Disc: OD:  cupping: 0.15   OS:  cupping: 0.2   Macula: OU:  normal  Vessels: OU:  normal  Periphery: OU:  normal  A Scan / IOLMaster:  AScan predicts +20.50 Acrysof MA50 BM for emmetropia OD   Orientation to person, place and time:  Normal  Mood and affect:  Normal  Impression:  366.19  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 We discussed the related symptoms , visual significance and when we intervene with surgery. We discussed the surgical techniques used, risks and benefits of surgery.  Discussed with his interpreter present. I have recommended Phaco IOL OD.  He indicated understanding our discussion and felt that his questions had been answered to his satisfaction.  He indicates that he desires to proceed with the recommended treatment/care plan.   Medications:   Continue Current Management Return to clinic:  November 6th at 4:45 PM for post-operative follow-up  Schedule:  Phacoemulsification, Posterior Chamber Intraocular Lens OD x 08/29/2013   (electronically signed)  Adonis Brook, MD

## 2013-08-17 NOTE — H&P (Signed)
Patient Record  [name], [name] Patient Number:  [id number] Date of Birth:  [birth year] Age:  72 years old    Gender:  Female Date of Evaluation:  August 03, 2013  Chief Complaint:   72 year old female c/o sticking in the mornings, tearing, itching History of Present Illness:   72 yo female for follow up conjunctivitis and catract.  Has been on tobradex.  On no meds.  No longer has burning and itching.  Lids do not sticktogether. Has some mucus in the AM.  Presents today for follow up evaluation. Past History:  Allergies:  ampicillin (unspecified), Active Medications:   Other Medications:  clopidogrel (clopidogrel) tablet 75 mg 1 tablet by mouth once a day tablet, Benicar (olmesartan) tablet 40 mg 1 tablet by mouth once a day tablet, hydrocodone-acetaminophen (hydrocodone-acetaminophen) tablet 5-325 mg 1 tablet by mouth every six hours as needed tablet, meclizine (meclizine) tablet 25 mg 1 tablet by mouth three times a day tablet, hydralazine (hydralazine) tablet 25 mg 1 tablet by mouth three times a day tablet, Vitamin D3 (cholecalciferol (vitamin d3)) capsule 2,000 unit 1 capsule by mouth once a day capsule, aspirin (aspirin) tablet,delayed release (DR/EC) 81 mg 1 tablet by mouth once a day tablet, ginkgo biloba (ginkgo biloba) tablet 120 mg, Crestor (rosuvastatin) tablet 10 mg 1 tablet by mouth once a day tablet, metoclopramide HCl (metoclopramide hcl) tablet 10 mg 1 tablet by mouth every six hours as needed tablet, metoprolol succinate (metoprolol succinate) tablet extended release 24 hr 100 mg 1 tablet by mouth twice a day tablet, isosorbide mononitrate (isosorbide mononitrate) tablet extended release 24 hr 60 mg 1 tablet by mouth once a day tablet, tramadol (tramadol) tablet 50 mg 1 tablet by mouth twice a day tablet, diltiazem HCl (diltiazem hcl) tablet extended release 24 hr 360 mg 1 tablet by mouth once a day tablet, pantoprazole (pantoprazole) tablet,delayed release (DR/EC) 40 mg 1  tablet by mouth once a day tablet, fluticasone (fluticasone) Spray, Suspension 50 mcg/actuation 2 spray into both nostrils once a day Birth History:  none Past Ocular History:  none Past Medical History:   Hypertension, Elevated Cholesterol Past Surgical History:  none Family History:  no amblyopia, no blindness, no cataracts, + crossed eyes (sister), no diabetic retinopathy, no glaucoma, no macular degeneration, no retinal detachment, + cancer (father), + diabetes (sister), + heart disease (uncle, grandmother), + high blood pressure (brother, sister, mother), + stroke (mother) Social History:  Smoking Status: never smoker  Alcohol:  none  Driving status:  driving  Marital status:  widowed Review of Systems:   Constitutional:  no fever, no weight loss    Eyes: + decreased vision, + blurred vision  Ear/Nose/Throat: + sinus problems  Cardiovascular:  + high blood pressure, + high cholesterol  Respiratory: + shortness of breath   Gastrointestinal: + nausea  Genitourinary:  no blood in urine, no discomfort    Musculoskeletal: + joint pain, + arthritis  Integumentary skin/breast:  no rashes, no skin tumors    Neurological: + numbness  Psychiatric:  no anxiety, no depression    Endocrine:  no heat intolerance, no thyroid problems    Hematologic/Lymphatic:  no anemia, no unusual bleeding    Allergic/Immunologic: + seasonal allergies  Examination:  Visual Acuity:   Distance VA cc:  OD: 20/25-1    OS: 20/25  Distance VA :  OD: 20/30-1    OS: 20/70 IOP:  OD:  12     OS:  14    @  10:14AM (Goldmann applanation) Manifest Refraction:    Sphere    Cyl Axis       VA         Add       VA Prism Base R:  +0.75  -0.50  170    20/25      +2.[name] Kitchen50                      L:  -0.25  -0.75  120    20/60       +2.50                        Confrontation visual field: OU:  Normal  Motility: OU:  Normal  Pupils: OU:  Shape, size, direct and consensual reaction normal  Adnexa:  Preauricular LN, lacrimal  drainage, lacrimal glands, orbit normal  Eyelids: Eyelids:  normal Conjunctiva: OU:  Papillary reaction, allergic ,   P2  Cornea: OU:  epithelium, stroma, endothelium, tear film normal  Anterior Chamber: OU:  depth normal, no cell, no flare, 3+ deep / clear  Iris: OU:  normal Dilation:  OU: AK-Dilate, Tropicacyl @ 10:07AM  Lens: OD:  2+ nuclear sclerotic cataract OS:  3+ nuclear sclerotic cataract,,  2+ cortical cataract, 1+ posterior subcapsular cataract  Vitreous: OU:  clear, with PVD  Optic Disc: OU:  cupping: 0.2   Macula: OU:  normal  Vessels: OU:  normal  Periphery: OU:  normal A Scan / IOLMaster:  Predicts  +19.50  Acrysof MA50 BM PC IOL for emmetropia OS  Orientation to person, place and time:  Normal  Mood and affect:  Normal  Impression:  366.19  Combined Cataract OU: NSC, Cortical, 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 We discussed the related symptoms , visual significance and when we intervene with surgery. We discussed the surgical techniques used, risks and benefits of surgery.  Her Cataract OS is visually significant and I recommend Phaco IOL OS. Risks benefits and alternatives wree discussed.  She indicates that she desires to proceed with the recommended treatment/care plan.  She indicated understanding our discussion and felt that her questions had been answered to her satisfaction.   Patient Instructions: Please do not eat anything after mignight the day before surgery. Return to clinic:  November 6th , 2014 4:45 for post-operative follow-up  Schedule:  Phacoemulsification, Posterior Chamber Intraocular Lens OS x 08/29/2013   (electronically signed)  Adonis Brook, MD

## 2013-08-24 ENCOUNTER — Encounter (HOSPITAL_COMMUNITY): Payer: Self-pay | Admitting: Pharmacy Technician

## 2013-08-27 ENCOUNTER — Encounter (HOSPITAL_COMMUNITY): Payer: Self-pay

## 2013-08-27 ENCOUNTER — Encounter (HOSPITAL_COMMUNITY)
Admission: RE | Admit: 2013-08-27 | Discharge: 2013-08-27 | Disposition: A | Discharge status: Home / Self Care | Payer: Medicare Other | Source: Ambulatory Visit | Attending: Ophthalmology | Admitting: Ophthalmology

## 2013-08-27 ENCOUNTER — Other Ambulatory Visit (HOSPITAL_COMMUNITY): Payer: Medicare Other

## 2013-08-27 HISTORY — DX: Depression, unspecified: F32.A

## 2013-08-27 HISTORY — DX: Nocturia: R35.1

## 2013-08-27 HISTORY — DX: Major depressive disorder, single episode, unspecified: F32.9

## 2013-08-27 LAB — CBC
HCT: 39.1 % (ref 36.0–46.0)
Hemoglobin: 12.9 g/dL (ref 12.0–15.0)
MCH: 31.8 pg (ref 26.0–34.0)
MCV: 96.3 fL (ref 78.0–100.0)
Platelets: 192 10*3/uL (ref 150–400)
RBC: 4.06 MIL/uL (ref 3.87–5.11)
RDW: 14.4 % (ref 11.5–15.5)
WBC: 6.4 10*3/uL (ref 4.0–10.5)

## 2013-08-27 LAB — BASIC METABOLIC PANEL
BUN: 22 mg/dL (ref 6–23)
CO2: 28 mEq/L (ref 19–32)
Calcium: 9.1 mg/dL (ref 8.4–10.5)
Chloride: 106 mEq/L (ref 96–112)
Creatinine, Ser: 1.45 mg/dL — ABNORMAL HIGH (ref 0.50–1.10)

## 2013-08-27 NOTE — Progress Notes (Signed)
Primary physician- Dr. Karlton Lemon Cardiologist - dr. Bari Edward in October, no other recent cardiac testing - will request records

## 2013-08-27 NOTE — Pre-Procedure Instructions (Signed)
CAMRIE ROTTMANN  08/27/2013   Your procedure is scheduled on:  Wednesday, November 5th  Report to Main Entrance "A" and check in with admitting at 1215 PM.  Call this number if you have problems the morning of surgery: [phone]   Remember:   Do not eat food or drink liquids after midnight.   Take these medicines the morning of surgery with A SIP OF WATER: cardizem, hydralazine, imdur, toprol, protonix, vicodin or ultram if needed   Do not wear jewelry, make-up or nail polish.  Do not wear lotions, powders, or perfumes. You may wear deodorant.  Do not shave 48 hours prior to surgery. Men may shave face and neck.  Do not bring valuables to the hospital.  [facility] is not responsible  for any belongings or valuables.               Contacts, dentures or bridgework may not be worn into surgery.  Leave suitcase in the car. After surgery it may be brought to your room.  For patients admitted to the hospital, discharge time is determined by your  treatment team.               Patients discharged the day of surgery will not be allowed to drive home.   Special Instructions: Shower using CHG 2 nights before surgery and the night before surgery.  If you shower the day of surgery use CHG.  Use special wash - you have one bottle of CHG for all showers.  You should use approximately 1/3 of the bottle for each shower.   Please read over the following fact sheets that you were given: Pain Booklet, Coughing and Deep Breathing and Surgical Site Infection Prevention

## 2013-08-28 NOTE — Progress Notes (Signed)
Call to Dr. Doylene Canard office, spoke with Vidal Schwalbe & requested that their office fax last ov note & stress test.  Also requested that the MD complete the perioperative Rx for her pacemaker relative to the care that she will require for while she is in surgery.

## 2013-08-29 ENCOUNTER — Ambulatory Visit (HOSPITAL_COMMUNITY): Payer: Medicare Other | Admitting: Anesthesiology

## 2013-08-29 ENCOUNTER — Encounter (HOSPITAL_COMMUNITY): Payer: Medicare Other | Admitting: Anesthesiology

## 2013-08-29 ENCOUNTER — Ambulatory Visit (HOSPITAL_COMMUNITY)
Admission: RE | Admit: 2013-08-29 | Discharge: 2013-08-29 | Disposition: A | Discharge status: Home / Self Care | Payer: Medicare Other | Source: Ambulatory Visit | Attending: Ophthalmology | Admitting: Ophthalmology

## 2013-08-29 ENCOUNTER — Encounter (HOSPITAL_COMMUNITY)
Admission: RE | Disposition: A | Discharge status: Home / Self Care | Payer: Self-pay | Source: Ambulatory Visit | Attending: Ophthalmology

## 2013-08-29 ENCOUNTER — Encounter (HOSPITAL_COMMUNITY): Payer: Self-pay | Admitting: Surgery

## 2013-08-29 DIAGNOSIS — H109 Unspecified conjunctivitis: Secondary | ICD-10-CM | POA: Insufficient documentation

## 2013-08-29 DIAGNOSIS — K219 Gastro-esophageal reflux disease without esophagitis: Secondary | ICD-10-CM | POA: Insufficient documentation

## 2013-08-29 DIAGNOSIS — H2589 Other age-related cataract: Secondary | ICD-10-CM | POA: Insufficient documentation

## 2013-08-29 DIAGNOSIS — G4730 Sleep apnea, unspecified: Secondary | ICD-10-CM | POA: Insufficient documentation

## 2013-08-29 DIAGNOSIS — I10 Essential (primary) hypertension: Secondary | ICD-10-CM | POA: Insufficient documentation

## 2013-08-29 DIAGNOSIS — Z950 Presence of cardiac pacemaker: Secondary | ICD-10-CM | POA: Insufficient documentation

## 2013-08-29 HISTORY — PX: CATARACT EXTRACTION W/PHACO: SHX586

## 2013-08-29 SURGERY — PHACOEMULSIFICATION, CATARACT, WITH IOL INSERTION
Anesthesia: Monitor Anesthesia Care | Site: Eye | Laterality: Left | Wound class: Clean

## 2013-08-29 MED ORDER — BUPIVACAINE HCL (PF) 0.75 % IJ SOLN
INTRAMUSCULAR | Status: AC
  Filled 2013-08-29: qty 10

## 2013-08-29 MED ORDER — NA CHONDROIT SULF-NA HYALURON 40-30 MG/ML IO SOLN
INTRAOCULAR | Status: AC
  Filled 2013-08-29: qty 0.5

## 2013-08-29 MED ORDER — ACETAZOLAMIDE SODIUM 500 MG IJ SOLR
INTRAMUSCULAR | Status: AC
  Filled 2013-08-29: qty 500

## 2013-08-29 MED ORDER — SODIUM CHLORIDE 0.9 % IV SOLN
INTRAVENOUS | Status: DC | PRN
  Administered 2013-08-29: 12:00:00 via INTRAVENOUS

## 2013-08-29 MED ORDER — PREDNISOLONE ACETATE 1 % OP SUSP
1.0000 [drp] | OPHTHALMIC | Status: AC
  Administered 2013-08-29: 1 [drp] via OPHTHALMIC
  Filled 2013-08-29: qty 5

## 2013-08-29 MED ORDER — SODIUM HYALURONATE 10 MG/ML IO SOLN
INTRAOCULAR | Status: AC
  Filled 2013-08-29: qty 0.85

## 2013-08-29 MED ORDER — VANCOMYCIN SUBCONJUNCTIVAL INJECTION 25 MG/0.5 ML
25.0000 mg | INTRAOCULAR | Status: DC
  Filled 2013-08-29: qty 0.5

## 2013-08-29 MED ORDER — GATIFLOXACIN 0.5 % OP SOLN
1.0000 [drp] | OPHTHALMIC | Status: AC | PRN
  Administered 2013-08-29 (×3): 1 [drp] via OPHTHALMIC
  Filled 2013-08-29: qty 2.5

## 2013-08-29 MED ORDER — NA CHONDROIT SULF-NA HYALURON 40-30 MG/ML IO SOLN
INTRAOCULAR | Status: DC | PRN
  Administered 2013-08-29: 0.5 mL via INTRAOCULAR

## 2013-08-29 MED ORDER — FENTANYL CITRATE 0.05 MG/ML IJ SOLN
INTRAMUSCULAR | Status: DC | PRN
  Administered 2013-08-29: 50 ug via INTRAVENOUS

## 2013-08-29 MED ORDER — ONDANSETRON HCL 4 MG/2ML IJ SOLN: 4.0000 mg | Freq: Once | INTRAMUSCULAR | Status: DC | PRN

## 2013-08-29 MED ORDER — PROVISC 10 MG/ML IO SOLN
INTRAOCULAR | Status: DC | PRN
  Administered 2013-08-29: 0.85 mL via INTRAOCULAR

## 2013-08-29 MED ORDER — HYPROMELLOSE (GONIOSCOPIC) 2.5 % OP SOLN
OPHTHALMIC | Status: AC
  Filled 2013-08-29: qty 15

## 2013-08-29 MED ORDER — EPINEPHRINE HCL 1 MG/ML IJ SOLN
INTRAMUSCULAR | Status: AC
  Filled 2013-08-29: qty 1

## 2013-08-29 MED ORDER — BACITRACIN-POLYMYXIN B 500-10000 UNIT/GM OP OINT
TOPICAL_OINTMENT | OPHTHALMIC | Status: DC | PRN
  Administered 2013-08-29: 1 via OPHTHALMIC

## 2013-08-29 MED ORDER — BSS IO SOLN
INTRAOCULAR | Status: AC
  Filled 2013-08-29: qty 500

## 2013-08-29 MED ORDER — STERILE WATER FOR IRRIGATION IR SOLN
Status: DC | PRN
  Administered 2013-08-29: 200 mL

## 2013-08-29 MED ORDER — MEPERIDINE HCL 25 MG/ML IJ SOLN: 6.2500 mg | INTRAMUSCULAR | Status: DC | PRN

## 2013-08-29 MED ORDER — OXYCODONE HCL 5 MG PO TABS: 5.0000 mg | ORAL_TABLET | Freq: Once | ORAL | Status: DC | PRN

## 2013-08-29 MED ORDER — VANCOMYCIN INTRAVITREAL INJECTION 1 MG/0.1 ML
INTRAOCULAR | Status: DC | PRN
  Administered 2013-08-29: 1 mg via INTRAVITREAL

## 2013-08-29 MED ORDER — SODIUM CHLORIDE 0.9 % IV SOLN
INTRAVENOUS | Status: DC
  Administered 2013-08-29: 12:00:00 via INTRAVENOUS

## 2013-08-29 MED ORDER — PROPOFOL 10 MG/ML IV BOLUS
INTRAVENOUS | Status: DC | PRN
  Administered 2013-08-29: 50 mg via INTRAVENOUS

## 2013-08-29 MED ORDER — TRAMADOL HCL 50 MG PO TABS: 50.0000 mg | ORAL_TABLET | ORAL | Status: DC | PRN

## 2013-08-29 MED ORDER — TETRACAINE HCL 0.5 % OP SOLN
OPHTHALMIC | Status: AC
  Filled 2013-08-29: qty 2

## 2013-08-29 MED ORDER — DEXAMETHASONE SODIUM PHOSPHATE 10 MG/ML IJ SOLN
INTRAMUSCULAR | Status: AC
  Filled 2013-08-29: qty 1

## 2013-08-29 MED ORDER — ONDANSETRON HCL 4 MG/2ML IJ SOLN
INTRAMUSCULAR | Status: DC | PRN
  Administered 2013-08-29: 4 mg via INTRAVENOUS

## 2013-08-29 MED ORDER — SODIUM CHLORIDE 0.9 % IV SOLN: INTRAVENOUS | Status: DC

## 2013-08-29 MED ORDER — EPINEPHRINE HCL 1 MG/ML IJ SOLN
INTRAMUSCULAR | Status: DC | PRN
  Administered 2013-08-29: 12:00:00

## 2013-08-29 MED ORDER — PHENYLEPHRINE HCL 2.5 % OP SOLN
1.0000 [drp] | OPHTHALMIC | Status: AC | PRN
  Administered 2013-08-29 (×3): 1 [drp] via OPHTHALMIC
  Filled 2013-08-29: qty 2

## 2013-08-29 MED ORDER — HYPROMELLOSE (GONIOSCOPIC) 2.5 % OP SOLN
OPHTHALMIC | Status: DC | PRN
  Administered 2013-08-29: 2 [drp] via OPHTHALMIC

## 2013-08-29 MED ORDER — HYDROMORPHONE HCL PF 1 MG/ML IJ SOLN: 0.2500 mg | INTRAMUSCULAR | Status: DC | PRN

## 2013-08-29 MED ORDER — LIDOCAINE HCL 2 % IJ SOLN
INTRAMUSCULAR | Status: DC | PRN
  Administered 2013-08-29: 12:00:00 via RETROBULBAR

## 2013-08-29 MED ORDER — OXYCODONE HCL 5 MG/5ML PO SOLN: 5.0000 mg | Freq: Once | ORAL | Status: DC | PRN

## 2013-08-29 MED ORDER — BACITRACIN-POLYMYXIN B 500-10000 UNIT/GM OP OINT
TOPICAL_OINTMENT | OPHTHALMIC | Status: AC
  Filled 2013-08-29: qty 3.5

## 2013-08-29 MED ORDER — TRIAMCINOLONE ACETONIDE 40 MG/ML IJ SUSP
INTRAMUSCULAR | Status: AC
  Filled 2013-08-29: qty 5

## 2013-08-29 MED ORDER — LIDOCAINE HCL 2 % IJ SOLN
INTRAMUSCULAR | Status: AC
  Filled 2013-08-29: qty 20

## 2013-08-29 MED ORDER — TETRACAINE HCL 0.5 % OP SOLN
2.0000 [drp] | OPHTHALMIC | Status: AC
  Administered 2013-08-29: 2 [drp] via OPHTHALMIC
  Filled 2013-08-29: qty 2

## 2013-08-29 MED ORDER — 0.9 % SODIUM CHLORIDE (POUR BTL) OPTIME
TOPICAL | Status: DC | PRN
  Administered 2013-08-29: 200 mL

## 2013-08-29 MED ORDER — DEXAMETHASONE SODIUM PHOSPHATE 10 MG/ML IJ SOLN
INTRAMUSCULAR | Status: DC | PRN
  Administered 2013-08-29: 20 mg

## 2013-08-29 SURGICAL SUPPLY — 49 items
APPLICATOR COTTON TIP 6IN STRL (MISCELLANEOUS) ×2 IMPLANT
APPLICATOR DR MATTHEWS STRL (MISCELLANEOUS) ×2 IMPLANT
BLADE KERATOME 2.75 (BLADE) ×2 IMPLANT
BLADE STAB KNIFE 15DEG (BLADE) IMPLANT
COVER MAYO STAND STRL (DRAPES) ×2 IMPLANT
DRAPE OPHTHALMIC 77X100 STRL (CUSTOM PROCEDURE TRAY) ×2 IMPLANT
DRAPE POUCH INSTRU U-SHP 10X18 (DRAPES) ×2 IMPLANT
DRSG TEGADERM 4X4.75 (GAUZE/BANDAGES/DRESSINGS) ×2 IMPLANT
FILTER BLUE MILLIPORE (MISCELLANEOUS) IMPLANT
GLOVE SS BIOGEL STRL SZ 6.5 (GLOVE) ×1 IMPLANT
GLOVE SUPERSENSE BIOGEL SZ 6.5 (GLOVE) ×1
GLOVE SURG ORTHO 7.0 STRL STRW (GLOVE) ×4 IMPLANT
GLOVE SURG SS PI 7.0 STRL IVOR (GLOVE) ×6 IMPLANT
GOWN SRG XL XLNG 56XLVL 4 (GOWN DISPOSABLE) ×1 IMPLANT
GOWN STRL NON-REIN LRG LVL3 (GOWN DISPOSABLE) ×2 IMPLANT
GOWN STRL NON-REIN XL XLG LVL4 (GOWN DISPOSABLE) ×1
KIT BASIN OR (CUSTOM PROCEDURE TRAY) ×2 IMPLANT
KIT ROOM TURNOVER OR (KITS) ×2 IMPLANT
KNIFE GRIESHABER SHARP 2.5MM (MISCELLANEOUS) ×2 IMPLANT
LENS IOL ACRYSOF MP POST 19.5 (Intraocular Lens) ×2 IMPLANT
MASK EYE SHIELD (GAUZE/BANDAGES/DRESSINGS) ×2 IMPLANT
NEEDLE 22X1 1/2 (OR ONLY) (NEEDLE) IMPLANT
NEEDLE 25GX 5/8IN NON SAFETY (NEEDLE) ×4 IMPLANT
NEEDLE HYPO 30X.5 LL (NEEDLE) ×4 IMPLANT
NS IRRIG 1000ML POUR BTL (IV SOLUTION) ×2 IMPLANT
PACK CATARACT CUSTOM (CUSTOM PROCEDURE TRAY) ×2 IMPLANT
PAD ARMBOARD 7.5X6 YLW CONV (MISCELLANEOUS) ×2 IMPLANT
PAD EYE OVAL STERILE LF (GAUZE/BANDAGES/DRESSINGS) ×2 IMPLANT
PAK PIK CVS CATARACT (OPHTHALMIC) ×2 IMPLANT
PROBE ANTERIOR 20G W/INFUS NDL (MISCELLANEOUS) IMPLANT
SHUTTLE MONARCH TYPE A (NEEDLE) ×2 IMPLANT
SPEAR EYE SURG WECK-CEL (MISCELLANEOUS) IMPLANT
SUT ETHILON 10-0 CS-B-6CS-B-6 (SUTURE)
SUT ETHILON 5 0 P 3 18 (SUTURE)
SUT ETHILON 9 0 TG140 8 (SUTURE) IMPLANT
SUT NYLON ETHILON 5-0 P-3 1X18 (SUTURE) IMPLANT
SUT PLAIN 6 0 TG1408 (SUTURE) IMPLANT
SUT POLY NON ABSORB 10-0 8 STR (SUTURE) IMPLANT
SUT VICRYL 6 0 S 29 12 (SUTURE) IMPLANT
SUTURE EHLN 10-0 CS-B-6CS-B-6 (SUTURE) IMPLANT
SYR 20CC LL (SYRINGE) IMPLANT
SYR TB 1ML LUER SLIP (SYRINGE) IMPLANT
SYRINGE 10CC LL (SYRINGE) IMPLANT
TAPE PAPER MEDFIX 1IN X 10YD (GAUZE/BANDAGES/DRESSINGS) ×2 IMPLANT
TIP ABS 45DEG FLARED 0.9MM (TIP) ×2 IMPLANT
TOWEL OR 17X24 6PK STRL BLUE (TOWEL DISPOSABLE) ×4 IMPLANT
WATER STERILE IRR 1000ML POUR (IV SOLUTION) ×2 IMPLANT
WIPE INSTRUMENT ADHESIVE BACK (MISCELLANEOUS) ×2 IMPLANT
WIPE INSTRUMENT VISIWIPE 73X73 (MISCELLANEOUS) ×2 IMPLANT

## 2013-08-29 NOTE — Op Note (Signed)
ILINCA ROCHA 08/29/2013 Cataract: Combined, Nuclear  Procedure: Phacoemulsification, Posterior Chamber Intra-ocular Lens Operative Eye:  left eye  Surgeon: Adonis Brook Estimated Blood Loss: minimal Specimens for Pathology:  None Complications: none  The patient was prepared and draped in the usual manner for ocular surgery on the left eye. A Cook lid speculum was placed. A peripheral clear corneal incision was made at the surgical limbus centered at the 11:00 meridian. A separate clear corneal stab incision was made with a 15 degree blade at the 2:00 meridian to permit bi-manual technique. Viscoat and  Provisc as an underlying layer next to the capsule was instilled into the anterior chamber through that incision.  A keratome was used to create a self sealing incision entering the anterior chamber at the 11:00 meridian. A capsulorhexis was performed using a bent 25g needle. The lens was hydrodissected and the nucleus was hydrodilineated using a Nichammin cannula. The Chang chopper was inserted and used to rotate the lens to insure adequate lens mobility. The phacoemulsification handpiece was inserted and a combined phaco-chop technique was employed, fracturing the lens into separate sections with subsequent removal with the phaco handpiece. 19  The I/A cannula was used to remove remaining lens cortex. Provisc was instilled and used to deepen the anterior chamber and posterior capsule bag. The Monarch injector was used to place a folded Acrysof MA50BM PC IOL, +19.50  diopters, into the capsule bag. A Sinskey lens hook was used to dial in the trailing haptic.  The I/A cannula was used to remove the viscoelastic from the anterior chamber. BSS was used to bring IOP to the desired range and the wound was checked to insure it was watertight. Subconjunctival injections of Ancef 100/0.13ml and Dexamethasone 0.5 ml of a 10mg /9ml solution were placed without complication. The lid speculum and drapes  were removed and the patient's eye was patched with Polymixin/Bacitracin ophthalmic ointment. An eye shield was placed and the patient was transferred alert and conversant from the operating room to the post-operative recovery area.   Adonis Brook, MD

## 2013-08-29 NOTE — Transfer of Care (Signed)
Immediate Anesthesia Transfer of Care Note  Patient: [name]  Procedure(s) Performed: Procedure(s): CATARACT EXTRACTION PHACO AND INTRAOCULAR LENS PLACEMENT ([facility]) (Left)  Patient Location: PACU  Anesthesia Type:MAC  Level of Consciousness: awake, alert  and oriented  Airway & Oxygen Therapy: Patient Spontanous Breathing  Post-op Assessment: Report given to PACU RN, Post -op Vital signs reviewed and stable and Patient moving all extremities  Post vital signs: Reviewed and stable  Complications: No apparent anesthesia complications

## 2013-08-29 NOTE — Interval H&P Note (Signed)
History and Physical Interval Note:  08/29/2013 11:39 AM  [name]  has presented today for surgery, with the diagnosis of Combined Cataract Left  The various methods of treatment have been discussed with the patient and family. After consideration of risks, benefits and other options for treatment, the patient has consented to  Procedure(s): CATARACT EXTRACTION PHACO AND INTRAOCULAR LENS PLACEMENT ([place]) (Left) as a surgical intervention .  The patient's history has been reviewed, patient examined, no change in status, stable for surgery.  I have reviewed the patient's chart and labs.  Questions were answered to the patient's satisfaction.     Norissa Bartee, Lavella Hammock

## 2013-08-29 NOTE — Anesthesia Postprocedure Evaluation (Signed)
Anesthesia Post Note  Patient: [name]  Procedure(s) Performed: Procedure(s) (LRB): CATARACT EXTRACTION PHACO AND INTRAOCULAR LENS PLACEMENT ([facility]) (Left)  Anesthesia type: general  Patient location: PACU  Post pain: Pain level controlled  Post assessment: Patient's Cardiovascular Status Stable  Last Vitals:  Filed Vitals:   08/29/13 1330  BP: 189/106  Pulse: 79  Temp:   Resp: 20    Post vital signs: Reviewed and stable  Level of consciousness: sedated  Complications: No apparent anesthesia complications

## 2013-08-29 NOTE — Preoperative (Signed)
Beta Blockers   Reason not to administer Beta Blockers:Not Applicable 

## 2013-08-29 NOTE — Anesthesia Preprocedure Evaluation (Addendum)
Anesthesia Evaluation  Patient identified by MRN, date of birth, ID band Patient awake    Reviewed: Allergy & Precautions, H&P , NPO status , Patient's Chart, lab work & pertinent test results  Airway Mallampati: I TM Distance: >3 FB Neck ROM: Full    Dental  (+) Partial Upper, Partial Lower and Dental Advisory Given   Pulmonary sleep apnea ,          Cardiovascular hypertension, Pt. on medications + pacemaker     Neuro/Psych    GI/Hepatic GERD-  Medicated and Controlled,  Endo/Other    Renal/GU      Musculoskeletal   Abdominal   Peds  Hematology   Anesthesia Other Findings   Reproductive/Obstetrics                          Anesthesia Physical Anesthesia Plan  ASA: III  Anesthesia Plan: MAC   Post-op Pain Management:    Induction: Intravenous  Airway Management Planned: Nasal Cannula  Additional Equipment:   Intra-op Plan:   Post-operative Plan: Extubation in OR  Informed Consent: I have reviewed the patients History and Physical, chart, labs and discussed the procedure including the risks, benefits and alternatives for the proposed anesthesia with the patient or authorized representative who has indicated his/her understanding and acceptance.     Plan Discussed with: CRNA and Surgeon  Anesthesia Plan Comments:        Anesthesia Quick Evaluation

## 2013-08-29 NOTE — H&P (View-Only) (Signed)
Patient Record  Play, Piw  Patient Number:  [id number] Date of Birth:  [birth year] Age:  72 years old    Gender:  female Date of Evaluation:  August 17, 2013  Chief Complaint:   72 year old female patient for c/o pain in both eyes when he blinks History of Present Illness:   72 yo female c/o blurred vision.  Does not see well for distance or near.  Also has pain and fb sensation ou.  Patient is non English speaiking Santiago Glad.  Has an interpreter with him.  Presents for evaluation. Past History:  Allergies:  NKDA Medications:  none Birth History:  none Past Ocular History:   Cataracts Past Medical History:  none Past Surgical History:  none Family History:  no amblyopia, no blindness, no cataracts, no crossed eyes, no diabetic retinopathy, no glaucoma, no macular degeneration, no retinal detachment, no cancer, + diabetes (daughter), no heart disease, + high blood pressure (daughter), no stroke Social History:   Smoking Status: never smoker  Alcohol:  none   Driving status:  driving Marital status:  married Living arrangements:  with spouse Review of Systems:   Constitutional: + fever, + weight loss  Eyes: + blurred vision  Ear/Nose/Throat: + hearing loss, + sinus problems  Cardiovascular:  + chest pain  Respiratory: + shortness of breath  Gastrointestinal: + abdominal pain, + nausea  Genitourinary: + discomfort  Musculoskeletal: + joint pain, + low back pain  Integumentary skin/breast:  no rashes, no skin tumors    Neurological: + numbness, + weakness  Psychiatric:  no anxiety, no depression    Endocrine:  no heat intolerance, no thyroid problems    Hematologic/Lymphatic:  no anemia, no unusual bleeding    Allergic/Immunologic:  no hives, no seasonal allergies    All other systems are negative.  Examination:  Visual Acuity:   Distance VA [place]:  OD: 20/100-1    OS: 20/100-1 IOP:  OD:  12     OS:  12    @ 03:30PM (Goldmann applanation) Manifest Refraction:    Sphere    Cyl Axis        VA         Add       VA Prism Base R:  +2.50  -3.50   90   20/100                                  L:  +2.50  -3.25   90   20/100                                    Confrontation visual field:  OU:  Normal  Motility: OU:  Normal  Pupils: OU:  Shape, size, direct and consensual reaction normal  Adnexa:  Preauricular LN, lacrimal drainage, lacrimal glands, orbit normal  Eyelids: Eyelids:  normal Conjunctiva: OU:  bulbar, palpebral normal  Cornea: OU:  epithelium, stroma, endothelium, tear film normal  Anterior Chamber: OU:  depth normal, no cell, no flare  Iris: OU:  normal Dilation:  OU: Tropicamide 1%/ N 2.5% @ 02:29PM  Lens: OU:  3+ nuclear sclerosis, 2+ cortical cataract  Vitreous: OU:  normal  Optic Disc: OD:  cupping: 0.15   OS:  cupping: 0.2   Macula: OU:  normal  Vessels: OU:  normal  Periphery: OU:  normal  A Scan / IOLMaster:  AScan predicts +20.50 Acrysof MA50 BM for emmetropia OD   Orientation to person, place and time:  Normal  Mood and affect:  Normal  Impression:  366.19  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 We discussed the related symptoms , visual significance and when we intervene with surgery. We discussed the surgical techniques used, risks and benefits of surgery.  Discussed with his interpreter present. I have recommended Phaco IOL OD.  He indicated understanding our discussion and felt that his questions had been answered to his satisfaction.  He indicates that he desires to proceed with the recommended treatment/care plan.   Medications:   Continue Current Management Return to clinic:  November 6th at 4:45 PM for post-operative follow-up  Schedule:  Phacoemulsification, Posterior Chamber Intraocular Lens OD x 08/29/2013   (electronically signed)  Adonis Brook, MD

## 2013-08-31 ENCOUNTER — Encounter (HOSPITAL_COMMUNITY): Payer: Self-pay | Admitting: Ophthalmology

## 2013-10-05 ENCOUNTER — Other Ambulatory Visit: Payer: Self-pay | Admitting: Gastroenterology

## 2013-10-08 ENCOUNTER — Encounter (HOSPITAL_COMMUNITY): Admission: RE | Payer: Self-pay | Source: Ambulatory Visit

## 2013-10-08 ENCOUNTER — Ambulatory Visit (HOSPITAL_COMMUNITY): Admission: RE | Admit: 2013-10-08 | Payer: Medicare Other | Source: Ambulatory Visit | Admitting: Gastroenterology

## 2013-10-08 SURGERY — COLONOSCOPY
Anesthesia: Moderate Sedation

## 2013-12-17 ENCOUNTER — Encounter (HOSPITAL_COMMUNITY): Payer: Self-pay | Admitting: *Deleted

## 2013-12-18 ENCOUNTER — Encounter (HOSPITAL_COMMUNITY): Payer: Self-pay | Admitting: Pharmacy Technician

## 2014-01-07 ENCOUNTER — Ambulatory Visit (HOSPITAL_COMMUNITY): Payer: Medicare Other | Admitting: Anesthesiology

## 2014-01-07 ENCOUNTER — Encounter (HOSPITAL_COMMUNITY): Payer: Self-pay

## 2014-01-07 ENCOUNTER — Encounter (HOSPITAL_COMMUNITY): Payer: Medicare Other | Admitting: Anesthesiology

## 2014-01-07 ENCOUNTER — Encounter (HOSPITAL_COMMUNITY)
Admission: RE | Disposition: A | Discharge status: Home / Self Care | Payer: Self-pay | Source: Ambulatory Visit | Attending: Gastroenterology

## 2014-01-07 ENCOUNTER — Ambulatory Visit (HOSPITAL_COMMUNITY)
Admission: RE | Admit: 2014-01-07 | Discharge: 2014-01-07 | Disposition: A | Discharge status: Home / Self Care | Payer: Medicare Other | Source: Ambulatory Visit | Attending: Gastroenterology | Admitting: Gastroenterology

## 2014-01-07 DIAGNOSIS — G4730 Sleep apnea, unspecified: Secondary | ICD-10-CM | POA: Insufficient documentation

## 2014-01-07 DIAGNOSIS — I252 Old myocardial infarction: Secondary | ICD-10-CM | POA: Insufficient documentation

## 2014-01-07 DIAGNOSIS — Z7982 Long term (current) use of aspirin: Secondary | ICD-10-CM | POA: Insufficient documentation

## 2014-01-07 DIAGNOSIS — E785 Hyperlipidemia, unspecified: Secondary | ICD-10-CM | POA: Insufficient documentation

## 2014-01-07 DIAGNOSIS — Z7902 Long term (current) use of antithrombotics/antiplatelets: Secondary | ICD-10-CM | POA: Insufficient documentation

## 2014-01-07 DIAGNOSIS — I739 Peripheral vascular disease, unspecified: Secondary | ICD-10-CM | POA: Insufficient documentation

## 2014-01-07 DIAGNOSIS — K5730 Diverticulosis of large intestine without perforation or abscess without bleeding: Secondary | ICD-10-CM | POA: Insufficient documentation

## 2014-01-07 DIAGNOSIS — Z950 Presence of cardiac pacemaker: Secondary | ICD-10-CM | POA: Insufficient documentation

## 2014-01-07 DIAGNOSIS — E7800 Pure hypercholesterolemia, unspecified: Secondary | ICD-10-CM | POA: Insufficient documentation

## 2014-01-07 DIAGNOSIS — Z90710 Acquired absence of both cervix and uterus: Secondary | ICD-10-CM | POA: Insufficient documentation

## 2014-01-07 DIAGNOSIS — Z79899 Other long term (current) drug therapy: Secondary | ICD-10-CM | POA: Insufficient documentation

## 2014-01-07 DIAGNOSIS — K5900 Constipation, unspecified: Secondary | ICD-10-CM | POA: Insufficient documentation

## 2014-01-07 DIAGNOSIS — I2510 Atherosclerotic heart disease of native coronary artery without angina pectoris: Secondary | ICD-10-CM | POA: Insufficient documentation

## 2014-01-07 DIAGNOSIS — I10 Essential (primary) hypertension: Secondary | ICD-10-CM | POA: Insufficient documentation

## 2014-01-07 DIAGNOSIS — K648 Other hemorrhoids: Secondary | ICD-10-CM | POA: Insufficient documentation

## 2014-01-07 DIAGNOSIS — I509 Heart failure, unspecified: Secondary | ICD-10-CM | POA: Insufficient documentation

## 2014-01-07 DIAGNOSIS — I6990 Unspecified sequelae of unspecified cerebrovascular disease: Secondary | ICD-10-CM | POA: Insufficient documentation

## 2014-01-07 DIAGNOSIS — K219 Gastro-esophageal reflux disease without esophagitis: Secondary | ICD-10-CM | POA: Insufficient documentation

## 2014-01-07 HISTORY — PX: COLONOSCOPY WITH PROPOFOL: SHX5780

## 2014-01-07 LAB — BASIC METABOLIC PANEL
BUN: 11 mg/dL (ref 6–23)
CO2: 26 meq/L (ref 19–32)
Calcium: 9.6 mg/dL (ref 8.4–10.5)
Chloride: 104 mEq/L (ref 96–112)
Creatinine, Ser: 1.28 mg/dL — ABNORMAL HIGH (ref 0.50–1.10)
GFR calc Af Amer: 48 mL/min — ABNORMAL LOW (ref >90)
GFR calc non Af Amer: 41 mL/min — ABNORMAL LOW (ref >90)
GLUCOSE: 94 mg/dL (ref 70–99)
POTASSIUM: 3.6 meq/L — AB (ref 3.7–5.3)
Sodium: 144 mEq/L (ref 137–147)

## 2014-01-07 SURGERY — COLONOSCOPY WITH PROPOFOL
Anesthesia: Monitor Anesthesia Care

## 2014-01-07 MED ORDER — LACTATED RINGERS IV SOLN: INTRAVENOUS | Status: DC

## 2014-01-07 MED ORDER — PROPOFOL INFUSION 10 MG/ML OPTIME
INTRAVENOUS | Status: DC | PRN
  Administered 2014-01-07: 75 ug/kg/min via INTRAVENOUS

## 2014-01-07 MED ORDER — FENTANYL CITRATE 0.05 MG/ML IJ SOLN: 25.0000 ug | INTRAMUSCULAR | Status: DC | PRN

## 2014-01-07 MED ORDER — KETAMINE HCL 10 MG/ML IJ SOLN
INTRAMUSCULAR | Status: AC
  Filled 2014-01-07: qty 1

## 2014-01-07 MED ORDER — SODIUM CHLORIDE 0.9 % IV SOLN: INTRAVENOUS | Status: DC

## 2014-01-07 MED ORDER — LIDOCAINE HCL (CARDIAC) 20 MG/ML IV SOLN
INTRAVENOUS | Status: AC
  Filled 2014-01-07: qty 5

## 2014-01-07 MED ORDER — PROPOFOL 10 MG/ML IV BOLUS
INTRAVENOUS | Status: AC
  Filled 2014-01-07: qty 20

## 2014-01-07 MED ORDER — METOPROLOL TARTRATE 1 MG/ML IV SOLN
5.0000 mg | Freq: Once | INTRAVENOUS | Status: AC
  Administered 2014-01-07: 5 mg via INTRAVENOUS

## 2014-01-07 MED ORDER — METOPROLOL TARTRATE 1 MG/ML IV SOLN
INTRAVENOUS | Status: AC
  Filled 2014-01-07: qty 5

## 2014-01-07 MED ORDER — LACTATED RINGERS IV SOLN
INTRAVENOUS | Status: DC | PRN
  Administered 2014-01-07: 13:00:00 via INTRAVENOUS

## 2014-01-07 MED ORDER — KETAMINE HCL 10 MG/ML IJ SOLN
INTRAMUSCULAR | Status: DC | PRN
Start: 2014-01-07 — End: 2014-01-07
  Administered 2014-01-07: 30 mg via INTRAVENOUS

## 2014-01-07 MED ORDER — LIDOCAINE HCL (CARDIAC) 20 MG/ML IV SOLN
INTRAVENOUS | Status: DC | PRN
  Administered 2014-01-07: 100 mg via INTRAVENOUS

## 2014-01-07 SURGICAL SUPPLY — 21 items

## 2014-01-07 NOTE — H&P (Addendum)
[name] is an 73 y.o. female.   Chief Complaint: Colorectal cancer screening. HPI: Patient is here for a colonoscopy. See office notes for details.  Past Medical History  Diagnosis Date  . Coronary artery disease     Cath 04/2006 normal coronaries, mild-mod slow flow in all coronaries with slow distall runoff, mild-mod LV enlargement, EF 55%   . Hypertension   . Hyperlipemia   . Arthritis   . CHF (congestive heart failure)     05/2011 echo severe concentric LVH, normal systolic fxn, grade 1 diastolic dysfxn, paradoxical septal motion  . Vertigo     takes meclizine daily  . 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 Hypercholesterolemia   . Morbid obesity   . GERD (gastroesophageal reflux disease)   . Dysrhythmia   . Peripheral vascular disease   . 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 Depression   . Sleep apnea     needs cpap but does not wear  . Nocturia   . MI (myocardial infarction)     NOT SURE HOW MANY  . Pneumonia NONE RECENT  . Stroke    Past Surgical History  Procedure Laterality Date  . Pacemaker insertion    . Pacemaker insertion    . Cardiac catheterization    . Dilation and curettage of uterus    . Insert / replace / remove pacemaker    . Tubal ligation    . Vascular surgery      vericose vein surgery  . Abdominal hysterectomy    . Cataract extraction w/phaco Left 08/29/2013    Procedure: CATARACT EXTRACTION PHACO AND INTRAOCULAR LENS PLACEMENT (IOC);  Surgeon: Adonis Brook, MD;  Location: [facility];  Service: Ophthalmology;  Laterality: Left;   History reviewed. No pertinent family history. Social History:  reports that she has never smoked. She has never used smokeless tobacco. She reports that she does not drink alcohol or use illicit drugs.  Allergies:  Allergies  Allergen Reactions  . Ampicillin Other (See Comments)    Increases heart rhythm    Medications Prior to Admission   Medication Sig Dispense Refill  . aspirin 81 MG chewable tablet Chew 1 tablet (81 mg total) by mouth daily.      . clopidogrel (PLAVIX) 75 MG tablet Take 75 mg by mouth every morning.       . diltiazem (CARDIZEM CD) 360 MG 24 hr capsule Take 360 mg by mouth every morning.       . furosemide (LASIX) 40 MG tablet Take 40 mg by mouth every morning.      . hydrALAZINE (APRESOLINE) 25 MG tablet Take 25 mg by mouth 3 (three) times daily.       [name] Kitchen HYDROcodone-acetaminophen (NORCO/VICODIN) 5-325 MG per tablet Take 1 tablet by mouth every 6 (six) hours as needed. For pain      . isosorbide mononitrate (IMDUR) 60 MG 24 hr tablet Take 60 mg by mouth every morning.       . meclizine (ANTIVERT) 25 MG tablet Take 12.5-25 mg by mouth 3 (three) times daily as needed. For dizziness      . metoCLOPramide (REGLAN) 10 MG tablet Take 10 mg by mouth every 6 (six) hours as needed for nausea.       . pantoprazole (PROTONIX) 40 MG tablet Take 40 mg by mouth daily.       . potassium chloride SA (  K-DUR,KLOR-CON) 20 MEQ tablet Take 20 mEq by mouth daily.       . rosuvastatin (CRESTOR) 10 MG tablet Take 10 mg by mouth every evening.       . traMADol (ULTRAM) 50 MG tablet Take 50 mg by mouth 2 (two) times daily as needed for pain.       . nitroGLYCERIN (NITROSTAT) 0.4 MG SL tablet Place 0.4 mg under the tongue every 5 (five) minutes as needed for chest pain.        No results found for this or any previous visit (from the past 48 hour(s)). No results found.  Review of Systems  Constitutional: Negative.   Eyes: Negative.   Cardiovascular: Negative.   Gastrointestinal: Positive for heartburn and constipation.  Genitourinary: Negative.   Musculoskeletal: Positive for joint pain.  Skin: Negative.    Blood pressure 170/117, pulse 78, temperature 98.8 F (37.1 C), temperature source Oral, resp. rate 12, height 5\' 6"  (1.676 m), weight 155.13 kg (342 lb), SpO2 95.00%. Physical Exam  Constitutional: She is oriented to  person, place, and time. She appears well-developed.  Morbidly obese  HENT:  Head: Normocephalic and atraumatic.  Eyes: Conjunctivae and EOM are normal. Pupils are equal, round, and reactive to light.  Neck: Normal range of motion. Neck supple.  Cardiovascular: Normal rate and regular rhythm.   Pacemaker present  Respiratory: Effort normal and breath sounds normal.  GI: Soft. Bowel sounds are normal.  Musculoskeletal: Normal range of motion.  Neurological: She is alert and oriented to person, place, and time.  Skin: Skin is warm and dry.  Psychiatric: She has a normal mood and affect. Her behavior is normal. Thought content normal.    Assessment/Plan Colorectal cancer screening: proceed with a colonoscopy at this time.   [name] 01/07/2014, 1:18 PM

## 2014-01-07 NOTE — Preoperative (Signed)
Beta Blockers   Reason not to administer Beta Blockers:Not Applicable 

## 2014-01-07 NOTE — Op Note (Signed)
[facility] [place], [address]   OPERATIVE PROCEDURE REPORT  PATIENT: [name], [name]  MR#: [id number] BIRTHDATE: 1940-10-30 GENDER: Female ENDOSCOPIST: Edmonia James, MD ASSISTANT:   Elna Breslow, RN Sharon Mt, Endo technician Unknown Jim, technician. PROCEDURE DATE: 01/07/2014 PRE-PROCEDURE PREPARATION: The patient was prepped with a gallon of Golytely the night prior to the procedure.  The patient was fasted for 8 hours prior to the procedure.  PRE-PROCEDURE PHYSICAL: Patient is morbidly obese with stable vital signs.  Neck is supple.  There is no JVD, thyromegaly or LAD. Chest clear to auscultation.  S1 and S2 regular.  Abdomen soft, morbidly obese, non-distended, non-tender with NABS. PROCEDURE:     Colonoscopy, diagnostic ASA CLASS:     Class IV INDICATIONS:     1.  Colorectal cancer screening 2. Constipation.  MEDICATIONS:     MAC sedation, administered by CRNA  DESCRIPTION OF PROCEDURE: After the risks, benefits, and alternatives of the procedure were thoroughly explained [including a 10% missed rate of cancer and polyps], informed consent was obtained.  Digital rectal exam was performed. The Pentax Adult Colonscope [id number]  was introduced through the anus  and advanced to the cecum, which was identified by both the appendix and ileocecal valve , limited by No adverse events experienced. The quality of the prep was fair. . Multiple washes were done. Small lesions could be missed. The instrument was then slowly withdrawn as the colon was fully examined.     COLON FINDINGS: Moderate diverticulosis was noted in the sigmoid colon.   The rest of the RESentire colonic mucosa appeared healthy with a normal vascular pattern.  No masses, polyps or AVMs were noted.  The appendiceal orifice and the ICV were identified and photographed. Retroflexed views revealed small internal hemorrhoids. The patient tolerated the  procedure without immediate complications.  The scope was then withdrawn from the patient and the procedure terminated.  TIME TO CECUM:   6 minutes 00 seconds WITHDRAW TIME:  6 minutes 00 seconds  IMPRESSION:     Small internal hemorrhoids and moderate diverticulosis was noted in the sigmoid colon  RECOMMENDATIONS:     1.  Continue surveillance. 2.  High fiber diet with liberal fluid intake. 3.  Continue current medications. 4.  OP follow-up is advised on a PRN basis.   REPEAT EXAM:      In 10 years  for a repeat colonoscopy.  If the patient has any abnormal GI symptoms in the interim, she have been advised to contact the office as soon as possible for further recommendations.   CPT CODES:     [id number], Colorectal Screening   DIAGNOSIS CODES:     562.10, 564.00, V76.51 Colorectal cancer screening   REFERRED [id number] [place], M.D.  Heath Gold, M.D.  eSigned:  Dr. Edmonia James, MD 01/07/2014 2:14 PM   PATIENT NAME:  [name], [name] MR#: [id number]

## 2014-01-07 NOTE — Discharge Instructions (Signed)
Colonoscopy, Care After °Refer to this sheet in the next few weeks. These instructions provide you with information on caring for yourself after your procedure. Your health care provider may also give you more specific instructions. Your treatment has been planned according to current medical practices, but problems sometimes occur. Call your health care provider if you have any problems or questions after your procedure. °WHAT TO EXPECT AFTER THE PROCEDURE  °After your procedure, it is typical to have the following: °· A small amount of blood in your stool. °· Moderate amounts of gas and mild abdominal cramping or bloating. °HOME CARE INSTRUCTIONS °· Do not drive, operate machinery, or sign important documents for 24 hours. °· You may shower and resume your regular physical activities, but move at a slower pace for the first 24 hours. °· Take frequent rest periods for the first 24 hours. °· Walk around or put a warm pack on your abdomen to help reduce abdominal cramping and bloating. °· Drink enough fluids to keep your urine clear or pale yellow. °· You may resume your normal diet as instructed by your health care provider. Avoid heavy or fried foods that are hard to digest. °· Avoid drinking alcohol for 24 hours or as instructed by your health care provider. °· Only take over-the-counter or prescription medicines as directed by your health care provider. °· If a tissue sample (biopsy) was taken during your procedure: °· Do not take aspirin or blood thinners for 7 days, or as instructed by your health care provider. °· Do not drink alcohol for 7 days, or as instructed by your health care provider. °· Eat soft foods for the first 24 hours. °SEEK MEDICAL CARE IF: °You have persistent spotting of blood in your stool 2 3 days after the procedure. °SEEK IMMEDIATE MEDICAL CARE IF: °· You have more than a small spotting of blood in your stool. °· You pass large blood clots in your stool. °· Your abdomen is swollen  (distended). °· You have nausea or vomiting. °· You have a fever. °· You have increasing abdominal pain that is not relieved with medicine. °Document Released: 05/25/2004 Document Revised: 08/01/2013 Document Reviewed: 06/18/2013 °ExitCare® Patient Information ©2014 [employer]. ° °

## 2014-01-07 NOTE — Anesthesia Preprocedure Evaluation (Addendum)
Anesthesia Evaluation  Patient identified by MRN, date of birth, ID band Patient awake    Reviewed: Allergy & Precautions, H&P , NPO status , Patient's Chart, lab work & pertinent test results  Airway Mallampati: II TM Distance: >3 FB Neck ROM: full    Dental  (+) Edentulous Upper, Missing, Dental Advisory Given Missing most of lower front teeth:   Pulmonary sleep apnea ,  breath sounds clear to auscultation  Pulmonary exam normal       Cardiovascular hypertension, Pt. on medications + CAD, + Past MI and +CHF Rhythm:regular Rate:Normal  Normal systolic function but grade 1 diastolic dysfunction.  Cath 2007 normal coronary arteries.   Neuro/Psych  Headaches, Depression Vertigo.  TIA and left vertebral artery occlusion 2012. TIACVA, Residual Symptoms negative psych ROS   GI/Hepatic negative GI ROS, Neg liver ROS,   Endo/Other  negative endocrine ROSMorbid obesity  Renal/GU negative Renal ROS  negative genitourinary   Musculoskeletal   Abdominal (+) + obese,   Peds  Hematology negative hematology ROS (+)   Anesthesia Other Findings   Reproductive/Obstetrics negative OB ROS                       Anesthesia Physical Anesthesia Plan  ASA: III  Anesthesia Plan: MAC   Post-op Pain Management:    Induction:   Airway Management Planned: Simple Face Mask  Additional Equipment:   Intra-op Plan:   Post-operative Plan:   Informed Consent: I have reviewed the patients History and Physical, chart, labs and discussed the procedure including the risks, benefits and alternatives for the proposed anesthesia with the patient or authorized representative who has indicated his/her understanding and acceptance.   Dental Advisory Given  Plan Discussed with: CRNA and Surgeon  Anesthesia Plan Comments:         Anesthesia Quick Evaluation

## 2014-01-07 NOTE — Transfer of Care (Signed)
Immediate Anesthesia Transfer of Care Note  Patient: [name]  Procedure(s) Performed: Procedure(s): COLONOSCOPY WITH PROPOFOL (N/A)  Patient Location: PACU and Endoscopy Unit  Anesthesia Type:MAC  Level of Consciousness: awake, alert , oriented and patient cooperative  Airway & Oxygen Therapy: Patient Spontanous Breathing and Patient connected to face mask oxygen  Post-op Assessment: Report given to PACU RN, Post -op Vital signs reviewed and stable and Patient moving all extremities  Post vital signs: Reviewed and stable  Complications: No apparent anesthesia complications

## 2014-01-07 NOTE — Anesthesia Postprocedure Evaluation (Signed)
  Anesthesia Post-op Note  Patient: [name]  Procedure(s) Performed: Procedure(s) (LRB): COLONOSCOPY WITH PROPOFOL (N/A)  Patient Location: PACU  Anesthesia Type: MAC  Level of Consciousness: awake and alert   Airway and Oxygen Therapy: Patient Spontanous Breathing  Post-op Pain: mild  Post-op Assessment: Post-op Vital signs reviewed, Patient's Cardiovascular Status Stable, Respiratory Function Stable, Patent Airway and No signs of Nausea or vomiting  Last Vitals:  Filed Vitals:   01/07/14 1520  BP: 185/102  Pulse:   Temp:   Resp: 10    Post-op Vital Signs: stable   Complications: No apparent anesthesia complications

## 2014-01-08 ENCOUNTER — Encounter (HOSPITAL_COMMUNITY): Payer: Self-pay | Admitting: Gastroenterology

## 2014-03-19 NOTE — Progress Notes (Signed)
Asked by Dr Doylene Canard to assist with a dual chamber pacemaker changeout (St. 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 Last device check reviewed - 92% A paced, 58% V paced good sensing/capture/lead impedances. No significant tachyarrhythmia. Discussed the pacemaker changeout procedure, possible risks/complications with the patient. She agrees to meet next Wednesday, June 3 at 1200h, with anticipated procedure at 1400h the same day.  Sanda Klein, MD, [facility] [phone] office [phone] pager

## 2014-03-22 ENCOUNTER — Encounter (HOSPITAL_COMMUNITY): Payer: Self-pay | Admitting: Pharmacy Technician

## 2014-03-27 ENCOUNTER — Ambulatory Visit (HOSPITAL_COMMUNITY)
Admission: RE | Admit: 2014-03-27 | Discharge: 2014-03-27 | Disposition: A | Discharge status: Home / Self Care | Payer: Medicare Other | Source: Ambulatory Visit | Attending: Cardiovascular Disease | Admitting: Cardiovascular Disease

## 2014-03-27 ENCOUNTER — Encounter (HOSPITAL_COMMUNITY)
Admission: RE | Disposition: A | Discharge status: Home / Self Care | Payer: Self-pay | Source: Ambulatory Visit | Attending: Cardiovascular Disease

## 2014-03-27 DIAGNOSIS — I442 Atrioventricular block, complete: Secondary | ICD-10-CM | POA: Insufficient documentation

## 2014-03-27 DIAGNOSIS — I10 Essential (primary) hypertension: Secondary | ICD-10-CM | POA: Insufficient documentation

## 2014-03-27 DIAGNOSIS — I2510 Atherosclerotic heart disease of native coronary artery without angina pectoris: Secondary | ICD-10-CM | POA: Insufficient documentation

## 2014-03-27 DIAGNOSIS — Z45010 Encounter for checking and testing of cardiac pacemaker pulse generator [battery]: Secondary | ICD-10-CM

## 2014-03-27 DIAGNOSIS — Z7982 Long term (current) use of aspirin: Secondary | ICD-10-CM | POA: Insufficient documentation

## 2014-03-27 DIAGNOSIS — Z45018 Encounter for adjustment and management of other part of cardiac pacemaker: Secondary | ICD-10-CM | POA: Insufficient documentation

## 2014-03-27 DIAGNOSIS — G4733 Obstructive sleep apnea (adult) (pediatric): Secondary | ICD-10-CM | POA: Insufficient documentation

## 2014-03-27 DIAGNOSIS — F329 Major depressive disorder, single episode, unspecified: Secondary | ICD-10-CM | POA: Insufficient documentation

## 2014-03-27 DIAGNOSIS — Z8673 Personal history of transient ischemic attack (TIA), and cerebral infarction without residual deficits: Secondary | ICD-10-CM | POA: Insufficient documentation

## 2014-03-27 DIAGNOSIS — I739 Peripheral vascular disease, unspecified: Secondary | ICD-10-CM | POA: Insufficient documentation

## 2014-03-27 DIAGNOSIS — R001 Bradycardia, unspecified: Secondary | ICD-10-CM

## 2014-03-27 DIAGNOSIS — I509 Heart failure, unspecified: Secondary | ICD-10-CM | POA: Insufficient documentation

## 2014-03-27 DIAGNOSIS — I498 Other specified cardiac arrhythmias: Secondary | ICD-10-CM | POA: Insufficient documentation

## 2014-03-27 DIAGNOSIS — Z7902 Long term (current) use of antithrombotics/antiplatelets: Secondary | ICD-10-CM | POA: Insufficient documentation

## 2014-03-27 DIAGNOSIS — K219 Gastro-esophageal reflux disease without esophagitis: Secondary | ICD-10-CM | POA: Insufficient documentation

## 2014-03-27 DIAGNOSIS — E785 Hyperlipidemia, unspecified: Secondary | ICD-10-CM | POA: Insufficient documentation

## 2014-03-27 DIAGNOSIS — E7800 Pure hypercholesterolemia, unspecified: Secondary | ICD-10-CM | POA: Insufficient documentation

## 2014-03-27 DIAGNOSIS — I252 Old myocardial infarction: Secondary | ICD-10-CM | POA: Insufficient documentation

## 2014-03-27 DIAGNOSIS — I5032 Chronic diastolic (congestive) heart failure: Secondary | ICD-10-CM | POA: Insufficient documentation

## 2014-03-27 DIAGNOSIS — F3289 Other specified depressive episodes: Secondary | ICD-10-CM | POA: Insufficient documentation

## 2014-03-27 DIAGNOSIS — I495 Sick sinus syndrome: Secondary | ICD-10-CM

## 2014-03-27 HISTORY — PX: PACEMAKER GENERATOR CHANGE: SHX5481

## 2014-03-27 LAB — BASIC METABOLIC PANEL
BUN: 14 mg/dL (ref 6–23)
CALCIUM: 9.3 mg/dL (ref 8.4–10.5)
CHLORIDE: 106 meq/L (ref 96–112)
CO2: 27 meq/L (ref 19–32)
Creatinine, Ser: 1.47 mg/dL — ABNORMAL HIGH (ref 0.50–1.10)
GFR calc Af Amer: 41 mL/min — ABNORMAL LOW (ref >90)
GFR calc non Af Amer: 35 mL/min — ABNORMAL LOW (ref >90)
GLUCOSE: 128 mg/dL — AB (ref 70–99)
POTASSIUM: 3.9 meq/L (ref 3.7–5.3)
SODIUM: 142 meq/L (ref 137–147)

## 2014-03-27 LAB — CBC
HEMATOCRIT: 41.9 % (ref 36.0–46.0)
HEMOGLOBIN: 14 g/dL (ref 12.0–15.0)
MCH: 32.2 pg (ref 26.0–34.0)
MCHC: 33.4 g/dL (ref 30.0–36.0)
MCV: 96.3 fL (ref 78.0–100.0)
Platelets: 227 10*3/uL (ref 150–400)
RBC: 4.35 MIL/uL (ref 3.87–5.11)
RDW: 14.7 % (ref 11.5–15.5)
WBC: 5.5 10*3/uL (ref 4.0–10.5)

## 2014-03-27 LAB — SURGICAL PCR SCREEN
MRSA, PCR: NEGATIVE
Staphylococcus aureus: NEGATIVE

## 2014-03-27 SURGERY — PACEMAKER GENERATOR CHANGE
Anesthesia: LOCAL

## 2014-03-27 MED ORDER — FENTANYL CITRATE 0.05 MG/ML IJ SOLN
INTRAMUSCULAR | Status: AC
  Filled 2014-03-27: qty 2

## 2014-03-27 MED ORDER — MIDAZOLAM HCL 5 MG/5ML IJ SOLN
INTRAMUSCULAR | Status: AC
  Filled 2014-03-27: qty 5

## 2014-03-27 MED ORDER — SODIUM CHLORIDE 0.9 % IV SOLN: INTRAVENOUS | Status: DC

## 2014-03-27 MED ORDER — HEPARIN (PORCINE) IN NACL 2-0.9 UNIT/ML-% IJ SOLN
INTRAMUSCULAR | Status: AC
  Filled 2014-03-27: qty 500

## 2014-03-27 MED ORDER — VANCOMYCIN HCL IN DEXTROSE 1-5 GM/200ML-% IV SOLN: 1000.0000 mg | Freq: Two times a day (BID) | INTRAVENOUS | Status: DC

## 2014-03-27 MED ORDER — MUPIROCIN 2 % EX OINT
TOPICAL_OINTMENT | CUTANEOUS | Status: AC
  Filled 2014-03-27: qty 22

## 2014-03-27 MED ORDER — MUPIROCIN 2 % EX OINT
TOPICAL_OINTMENT | Freq: Two times a day (BID) | CUTANEOUS | Status: DC
  Administered 2014-03-27: 1 via NASAL
  Filled 2014-03-27: qty 22

## 2014-03-27 MED ORDER — ONDANSETRON HCL 4 MG/2ML IJ SOLN: 4.0000 mg | Freq: Four times a day (QID) | INTRAMUSCULAR | Status: DC | PRN

## 2014-03-27 MED ORDER — HEPARIN SODIUM (PORCINE) 1000 UNIT/ML IJ SOLN
INTRAMUSCULAR | Status: AC
  Filled 2014-03-27: qty 1

## 2014-03-27 MED ORDER — VANCOMYCIN HCL 10 G IV SOLR
1500.0000 mg | INTRAVENOUS | Status: DC
  Filled 2014-03-27 (×2): qty 1500

## 2014-03-27 MED ORDER — SODIUM CHLORIDE 0.9 % IV SOLN
INTRAVENOUS | Status: DC
  Administered 2014-03-27: 15:00:00 via INTRAVENOUS

## 2014-03-27 MED ORDER — SODIUM CHLORIDE 0.9 % IR SOLN
80.0000 mg | Status: DC
  Filled 2014-03-27 (×2): qty 2

## 2014-03-27 MED ORDER — LIDOCAINE HCL (PF) 1 % IJ SOLN
INTRAMUSCULAR | Status: AC
Start: 2014-03-27 — End: 2014-03-27
  Filled 2014-03-27: qty 60

## 2014-03-27 MED ORDER — HYDROCODONE-ACETAMINOPHEN 5-325 MG PO TABS: 1.0000 | ORAL_TABLET | ORAL | Status: DC | PRN

## 2014-03-27 MED ORDER — ACETAMINOPHEN 325 MG PO TABS: 325.0000 mg | ORAL_TABLET | ORAL | Status: DC | PRN

## 2014-03-27 NOTE — Discharge Instructions (Signed)
Supplemental Discharge Instructions for  Pacemaker/Defibrillator Patients  Activity No restrictions  WOUND CARE   Keep the wound area clean and dry.  Remove the dressing the day after you return home (usually 48 hours after the procedure).   DO NOT SUBMERGE UNDER WATER UNTIL FULLY HEALED (no tub baths, hot tubs, swimming pools, etc.).    You  may shower or take a sponge bath after the dressing is removed. DO NOT SOAK the area and do not allow the shower to directly spray on the site.   If you have staples, these will be removed in the office in 7-14 days.   If you have tape/steri-strips on your wound, these will fall off; do not pull them off prematurely.     No bandage is needed on the site.  DO  NOT apply any creams, oils, or ointments to the wound area.   If you notice any drainage or discharge from the wound, any swelling, excessive redness or bruising at the site, or if you develop a fever > 101? F after you are discharged home, call the office at once.  Special Instructions   You are still able to use cellular telephones.  Avoid carrying your cellular phone near your device.   When traveling through airports, show security personnel your identification card to avoid being screened in the metal detectors.    Avoid arc welding equipment, MRI testing (magnetic resonance imaging), TENS units (transcutaneous nerve stimulators).  Call the office for questions about other devices.   Avoid electrical appliances that are in poor condition or are not properly grounded.   Microwave ovens are safe to be near or to operate.

## 2014-03-27 NOTE — Op Note (Addendum)
error 

## 2014-03-27 NOTE — Op Note (Signed)
Procedure report  Procedure performed:  1. Dual chamber generator changeout    Reason for procedure:  1. Device generator at elective replacement interval  2. History of third degree AV block 3. Symptomatic sinus bradycardia Procedure performed by:  Sanda Klein, MD  Complications:  None  Estimated blood loss:  <5 mL  Medications administered during procedure:  Vancomycin 1.5 g intravenously, lidocaine 1% 30 mL locally  Device details:   [place] number [id number], serial number [id number] Right atrial lead (chronic) St. Jude, model number [id number], serial number [id number] (implanted 2005) Right ventricular lead (chronic)  St. Jude , model number 1336T-52, serial number [id number] (implanted 2005)  Explanted generator St. Jude Identity XL DR,  model number [id number], serial number  [id number] (implanted 2005)  Procedure details:  After the risks and benefits of the procedure were discussed the patient provided informed consent. She was brought to the cardiac catheter lab in the fasting state. The patient was prepped and draped in usual sterile fashion. Local anesthesia with 1% lidocaine was administered to to the right infraclavicular area. A 5-6cm horizontal incision was made parallel with and 2-3 cm caudal to the right clavicle, in the area of an old scar. An older scar was seen closer to the right clavicle. Using minimal electrocautery and mostly sharp and blunt dissection the prepectoral pocket was opened carefully to avoid injury to the loops of chronic leads. Extensive dissection was not necessary. The device was explanted. The pocket was carefully inspected for hemostasis and flushed with copious amounts of antibiotic solution.  The leads were disconnected from the old generator and testing of the lead parameters via telemetry showed excellent values. The new generator was connected to the chronic leads, with appropriate pacing noted.   The entire system was then  carefully inserted in the pocket with care been taking that the leads and device assumed a comfortable position without pressure on the incision. Great care was taken that the leads be located deep to the generator. The pocket was then closed in layers using 2 layers of 2-0 Vicryl and cutaneous staples after which a sterile dressing was applied.   At the end of the procedure the following lead parameters were encountered:   Right atrial lead sensed P waves 3.9 mV, impedance 510 ohms, threshold 0.7V at 0.4 ms pulse width.  Right ventricular lead sensed R waves  9.0 mV, impedance 460 ohms, threshold 1.0 at 0.4 ms pulse width.  Sanda Klein, MD, [facility] [phone] office [phone] pager

## 2014-03-27 NOTE — H&P (Signed)
Chief Complaint:  Pacemaker generator at [facility]  HPI:  This is my first encounter with [name], referred by Dr. Doylene Canard for pacemaker generator changeout  This is a 73 y.o. 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 permanent pacemaker in 2005, device now at [facility]. Records report second degree AV block , but pacemaker interrogation shows virtually 100% A pacing with 1:1 native AV conduction. Not pacer dependent - underlying sinus bradycardia in low 40s. Good chronic lead parameters by recent device check.  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 Coronary artery disease     Cath 04/2006 normal coronaries, mild-mod slow flow in all coronaries with slow distall runoff, mild-mod LV enlargement, EF 55%   . Hypertension   . Hyperlipemia   . Arthritis   . CHF (congestive heart failure)     05/2011 echo severe concentric LVH, normal systolic fxn, grade 1 diastolic dysfxn, paradoxical septal motion  . Vertigo     takes meclizine daily  . 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 Hypercholesterolemia   . Morbid obesity   . GERD (gastroesophageal reflux disease)   . Dysrhythmia   . Peripheral vascular disease   . 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 Depression   . Sleep apnea     needs cpap but does not wear  . Nocturia   . MI (myocardial infarction)     NOT SURE HOW MANY  . Pneumonia NONE RECENT  . Stroke     Past Surgical History  Procedure Laterality Date  . Pacemaker insertion    . Pacemaker insertion    . Cardiac  catheterization    . Dilation and curettage of uterus    . Insert / replace / remove pacemaker    . Tubal ligation    . Vascular surgery      vericose vein surgery  . Abdominal hysterectomy    . Cataract extraction w/phaco Left 08/29/2013    Procedure: CATARACT EXTRACTION PHACO AND INTRAOCULAR LENS PLACEMENT (IOC);  Surgeon: Adonis Brook, MD;  Location: [place];  Service: Ophthalmology;  Laterality: Left;  . Colonoscopy with propofol N/A 01/07/2014    Procedure: COLONOSCOPY WITH PROPOFOL;  Surgeon: Juanita Craver, MD;  Location: [facility];  Service: Endoscopy;  Laterality: N/A;    FAMHx:  No family history of arrhythmia or premature CAD.  SOCHx:   reports that she has never smoked. She has never used smokeless tobacco. She reports that she does not drink alcohol or use illicit drugs.  ALLERGIES:  Allergies  Allergen Reactions  . Ampicillin Other (See Comments)    Increases heart rhythm     ROS: Pertinent items are noted in HPI. She is very sedentary The patient specifically denies any chest pain at rest, dyspnea at rest, orthopnea, paroxysmal nocturnal dyspnea, syncope, palpitations, new focal neurological deficits, intermittent claudication, change in chronic lower extremity edema (left ankle "always swollen"), unexplained weight gain, cough, hemoptysis or wheezing.   HOME MEDS: Medications Prior to Admission  Medication Sig Dispense Refill  .  aspirin 81 MG chewable tablet Chew 1 tablet (81 mg total) by mouth daily.      . clopidogrel (PLAVIX) 75 MG tablet Take 75 mg by mouth every morning.       . diltiazem (CARDIZEM CD) 360 MG 24 hr capsule Take 360 mg by mouth every morning.       . furosemide (LASIX) 40 MG tablet Take 40 mg by mouth every morning.      . hydrALAZINE (APRESOLINE) 25 MG tablet Take 25 mg by mouth 3 (three) times daily.       [name] Kitchen HYDROcodone-acetaminophen (NORCO/VICODIN) 5-325 MG per tablet Take 1 tablet by mouth every 6 (six) hours as needed. For pain      .  isosorbide mononitrate (IMDUR) 60 MG 24 hr tablet Take 60 mg by mouth every morning.       . meclizine (ANTIVERT) 25 MG tablet Take 12.5-25 mg by mouth 3 (three) times daily as needed. For dizziness      . metoCLOPramide (REGLAN) 10 MG tablet Take 10 mg by mouth every 6 (six) hours as needed for nausea.       . nitroGLYCERIN (NITROSTAT) 0.4 MG SL tablet Place 0.4 mg under the tongue every 5 (five) minutes as needed for chest pain.       . pantoprazole (PROTONIX) 40 MG tablet Take 40 mg by mouth daily.       . potassium chloride SA (K-DUR,KLOR-CON) 20 MEQ tablet Take 20 mEq by mouth daily.       . rosuvastatin (CRESTOR) 10 MG tablet Take 10 mg by mouth every evening.       . traMADol (ULTRAM) 50 MG tablet Take 50 mg by mouth 2 (two) times daily as needed for pain.         LABS/IMAGING: Results for orders placed during the hospital encounter of 03/27/14 (from the past 48 hour(s))  BASIC METABOLIC PANEL     Status: Abnormal   Collection Time    03/27/14  2:20 PM      Result Value Ref Range   Sodium 142  137 - 147 mEq/L   Potassium 3.9  3.7 - 5.3 mEq/L   Chloride 106  96 - 112 mEq/L   CO2 27  19 - 32 mEq/L   Glucose, Bld 128 (*) 70 - 99 mg/dL   BUN 14  6 - 23 mg/dL   Creatinine, Ser 1.47 (*) 0.50 - 1.10 mg/dL   Calcium 9.3  8.4 - 10.5 mg/dL   GFR calc non Af Amer 35 (*) >90 mL/min   GFR calc Af Amer 41 (*) >90 mL/min   Comment: (NOTE)     The eGFR has been calculated using the CKD EPI equation.     This calculation has not been validated in all clinical situations.     eGFR's persistently <90 mL/min signify possible Chronic Kidney     Disease.  CBC     Status: None   Collection Time    03/27/14  2:20 PM      Result Value Ref Range   WBC 5.5  4.0 - 10.5 K/uL   RBC 4.35  3.87 - 5.11 MIL/uL   Hemoglobin 14.0  12.0 - 15.0 g/dL   HCT 41.9  36.0 - 46.0 %   MCV 96.3  78.0 - 100.0 fL   MCH 32.2  26.0 - 34.0 pg   MCHC 33.4  30.0 - 36.0 g/dL   RDW 14.7  11.5 - 15.5 %  Platelets 227  150  - 400 K/uL   No results found.  VITALS: Blood pressure 179/88, pulse 64, temperature 98 F (36.7 C), temperature source Oral, resp. rate 20, height '5\' 6"'  (1.676 m), weight 332 lb (150.594 kg), SpO2 99.00%.  EXAM:  General: Alert, oriented x3, no distress Head: no evidence of trauma, PERRL, EOMI, no exophtalmos or lid lag, no myxedema, no xanthelasma; normal ears, nose and oropharynx Neck: Normal jugular venous pulsations and no hepatojugular reflux; brisk carotid pulses without delay and no carotid bruits Chest: clear to auscultation, no signs of consolidation by percussion or palpation, normal fremitus, symmetrical and full respiratory excursions Cardiovascular: unable to  locate the apical impulse, regular rhythm, normal first heart sound and second heart sound, no rubs or gallops, no murmur Abdomen: obesity limits her exam, no tenderness or distention, no masses by palpation, no abnormal pulsatility or arterial bruits, normal bowel sounds, no hepatosplenomegaly Extremities: no clubbing, cyanosis; unilateral 2+ left ankle edema (chronic); 2+ radial, ulnar and brachial pulses bilaterally; 2+ right femoral, posterior tibial and dorsalis pedis pulses; 2+ left femoral, posterior tibial and dorsalis pedis pulses; no subclavian or femoral bruits Neurological: grossly nonfocal   IMPRESSION: Symptomatic sinus bradycardia Intermittent 3rd degree AV block and history of syncope Pacemaker at E[facility]  PLAN: Pacemaker generator change This procedure has been fully reviewed with the patient and written informed consent has been obtained.   Sanda Klein, MD, [facility] [phone] office [phone] pager  03/27/2014, 1:54 PM

## 2014-03-28 ENCOUNTER — Telehealth: Payer: Self-pay | Admitting: Cardiovascular Disease

## 2014-03-28 ENCOUNTER — Emergency Department (HOSPITAL_BASED_OUTPATIENT_CLINIC_OR_DEPARTMENT_OTHER)
Admission: EM | Admit: 2014-03-28 | Discharge: 2014-03-28 | Disposition: A | Discharge status: Home / Self Care | Payer: Medicare Other | Attending: Emergency Medicine | Admitting: Emergency Medicine

## 2014-03-28 ENCOUNTER — Encounter (HOSPITAL_BASED_OUTPATIENT_CLINIC_OR_DEPARTMENT_OTHER): Payer: Self-pay | Admitting: Emergency Medicine

## 2014-03-28 DIAGNOSIS — F3289 Other specified depressive episodes: Secondary | ICD-10-CM | POA: Insufficient documentation

## 2014-03-28 DIAGNOSIS — G8918 Other acute postprocedural pain: Secondary | ICD-10-CM | POA: Insufficient documentation

## 2014-03-28 DIAGNOSIS — Z9889 Other specified postprocedural states: Secondary | ICD-10-CM | POA: Insufficient documentation

## 2014-03-28 DIAGNOSIS — I509 Heart failure, unspecified: Secondary | ICD-10-CM | POA: Insufficient documentation

## 2014-03-28 DIAGNOSIS — I252 Old myocardial infarction: Secondary | ICD-10-CM | POA: Insufficient documentation

## 2014-03-28 DIAGNOSIS — F329 Major depressive disorder, single episode, unspecified: Secondary | ICD-10-CM | POA: Insufficient documentation

## 2014-03-28 DIAGNOSIS — I10 Essential (primary) hypertension: Secondary | ICD-10-CM | POA: Insufficient documentation

## 2014-03-28 DIAGNOSIS — I2510 Atherosclerotic heart disease of native coronary artery without angina pectoris: Secondary | ICD-10-CM | POA: Insufficient documentation

## 2014-03-28 DIAGNOSIS — Z950 Presence of cardiac pacemaker: Secondary | ICD-10-CM | POA: Insufficient documentation

## 2014-03-28 DIAGNOSIS — Z8701 Personal history of pneumonia (recurrent): Secondary | ICD-10-CM | POA: Insufficient documentation

## 2014-03-28 DIAGNOSIS — M129 Arthropathy, unspecified: Secondary | ICD-10-CM | POA: Insufficient documentation

## 2014-03-28 DIAGNOSIS — Y831 Surgical operation with implant of artificial internal device as the cause of abnormal reaction of the patient, or of later complication, without mention of misadventure at the time of the procedure: Secondary | ICD-10-CM | POA: Insufficient documentation

## 2014-03-28 DIAGNOSIS — E7800 Pure hypercholesterolemia, unspecified: Secondary | ICD-10-CM | POA: Insufficient documentation

## 2014-03-28 DIAGNOSIS — Z8673 Personal history of transient ischemic attack (TIA), and cerebral infarction without residual deficits: Secondary | ICD-10-CM | POA: Insufficient documentation

## 2014-03-28 DIAGNOSIS — IMO0002 Reserved for concepts with insufficient information to code with codable children: Secondary | ICD-10-CM | POA: Insufficient documentation

## 2014-03-28 DIAGNOSIS — K219 Gastro-esophageal reflux disease without esophagitis: Secondary | ICD-10-CM | POA: Insufficient documentation

## 2014-03-28 DIAGNOSIS — Z79899 Other long term (current) drug therapy: Secondary | ICD-10-CM | POA: Insufficient documentation

## 2014-03-28 DIAGNOSIS — Z7982 Long term (current) use of aspirin: Secondary | ICD-10-CM | POA: Insufficient documentation

## 2014-03-28 DIAGNOSIS — Z7902 Long term (current) use of antithrombotics/antiplatelets: Secondary | ICD-10-CM | POA: Insufficient documentation

## 2014-03-28 MED ORDER — TRAMADOL HCL 50 MG PO TABS: 50.0000 mg | ORAL_TABLET | Freq: Four times a day (QID) | ORAL | Status: DC | PRN

## 2014-03-28 MED ORDER — BACITRACIN 500 UNIT/GM EX OINT
1.0000 "application " | TOPICAL_OINTMENT | Freq: Once | CUTANEOUS | Status: AC
  Administered 2014-03-28: 1 via TOPICAL
  Filled 2014-03-28: qty 0.9

## 2014-03-28 MED ORDER — TRAMADOL HCL 50 MG PO TABS
50.0000 mg | ORAL_TABLET | Freq: Once | ORAL | Status: AC
  Administered 2014-03-28: 50 mg via ORAL
  Filled 2014-03-28: qty 1

## 2014-03-28 MED ORDER — TRAMADOL HCL 50 MG PO TABS: 50.0000 mg | ORAL_TABLET | Freq: Once | ORAL | Status: DC

## 2014-03-28 NOTE — Discharge Instructions (Signed)
The bleeding is probably from the aspirin and clopidogrel that you are taking for your heart. You need to continue taking these. The pain is from some bleeding around where the pacemaker battery was inserted. This will gradually get better. Please follow up with your cardiologist tomorrow.  There are some spots were the top layer of skin came off when your dressing was removed. These will heal on their own. You can apply bacitracin ointment or Neosporin ointment as needed.  Tramadol tablets What is this medicine? TRAMADOL (TRA ma dole) is a pain reliever. It is used to treat moderate to severe pain in adults. This medicine may be used for other purposes; ask your health care provider or pharmacist if you have questions. COMMON BRAND NAME(S): Ultram What should I tell my health care provider before I take this medicine? They need to know if you have any of these conditions: -brain tumor -depression -drug abuse or addiction -head injury -if you frequently drink alcohol containing drinks -kidney disease or trouble passing urine -liver disease -lung disease, asthma, or breathing problems -seizures or epilepsy -suicidal thoughts, plans, or attempt; a previous suicide attempt by you or a family member -an unusual or allergic reaction to tramadol, codeine, other medicines, foods, dyes, or preservatives -pregnant or trying to get pregnant -breast-feeding How should I use this medicine? Take this medicine by mouth with a full glass of water. Follow the directions on the prescription label. If the medicine upsets your stomach, take it with food or milk. Do not take more medicine than you are told to take. Talk to your pediatrician regarding the use of this medicine in children. Special care may be needed. Overdosage: If you think you have taken too much of this medicine contact a poison control center or emergency room at once. NOTE: This medicine is only for you. Do not share this medicine with  others. What if I miss a dose? If you miss a dose, take it as soon as you can. If it is almost time for your next dose, take only that dose. Do not take double or extra doses. What may interact with this medicine? Do not take this medicine with any of the following medications: -MAOIs like Carbex, Eldepryl, Marplan, Nardil, and Parnate This medicine may also interact with the following medications: -alcohol or medicines that contain alcohol -antihistamines -benzodiazepines -bupropion -carbamazepine or oxcarbazepine -clozapine -cyclobenzaprine -digoxin -furazolidone -linezolid -medicines for depression, anxiety, or psychotic disturbances -medicines for migraine headache like almotriptan, eletriptan, frovatriptan, naratriptan, rizatriptan, sumatriptan, zolmitriptan -medicines for pain like pentazocine, buprenorphine, butorphanol, meperidine, nalbuphine, and propoxyphene -medicines for sleep -muscle relaxants -naltrexone -phenobarbital -phenothiazines like perphenazine, thioridazine, chlorpromazine, mesoridazine, fluphenazine, prochlorperazine, promazine, and trifluoperazine -procarbazine -warfarin This list may not describe all possible interactions. Give your health care provider a list of all the medicines, herbs, non-prescription drugs, or dietary supplements you use. Also tell them if you smoke, drink alcohol, or use illegal drugs. Some items may interact with your medicine. What should I watch for while using this medicine? Tell your doctor or health care professional if your pain does not go away, if it gets worse, or if you have new or a different type of pain. You may develop tolerance to the medicine. Tolerance means that you will need a higher dose of the medicine for pain relief. Tolerance is normal and is expected if you take this medicine for a long time. Do not suddenly stop taking your medicine because you may develop a severe reaction. Your body  becomes used to the  medicine. This does NOT mean you are addicted. Addiction is a behavior related to getting and using a drug for a non-medical reason. If you have pain, you have a medical reason to take pain medicine. Your doctor will tell you how much medicine to take. If your doctor wants you to stop the medicine, the dose will be slowly lowered over time to avoid any side effects. You may get drowsy or dizzy. Do not drive, use machinery, or do anything that needs mental alertness until you know how this medicine affects you. Do not stand or sit up quickly, especially if you are an older patient. This reduces the risk of dizzy or fainting spells. Alcohol can increase or decrease the effects of this medicine. Avoid alcoholic drinks. You may have constipation. Try to have a bowel movement at least every 2 to 3 days. If you do not have a bowel movement for 3 days, call your doctor or health care professional. Your mouth may get dry. Chewing sugarless gum or sucking hard candy, and drinking plenty of water may help. Contact your doctor if the problem does not go away or is severe. What side effects may I notice from receiving this medicine? Side effects that you should report to your doctor or health care professional as soon as possible: -allergic reactions like skin rash, itching or hives, swelling of the face, lips, or tongue -breathing difficulties, wheezing -confusion -itching -light headedness or fainting spells -redness, blistering, peeling or loosening of the skin, including inside the mouth -seizures Side effects that usually do not require medical attention (report to your doctor or health care professional if they continue or are bothersome): -constipation -dizziness -drowsiness -headache -nausea, vomiting This list may not describe all possible side effects. Call your doctor for medical advice about side effects. You may report side effects to FDA at 1-800-FDA-1088. Where should I keep my medicine? Keep  out of the reach of children. Store at room temperature between 15 and 30 degrees C (59 and 86 degrees F). Keep container tightly closed. Throw away any unused medicine after the expiration date. NOTE: This sheet is a summary. It may not cover all possible information. If you have questions about this medicine, talk to your doctor, pharmacist, or health care provider.  2014, Elsevier/Gold Standard. (2010-06-24 11:55:44)

## 2014-03-28 NOTE — ED Provider Notes (Signed)
CSN: [id number]     Arrival date & time 03/28/14  1854 History  This chart was scribed for Delora Fuel, MD by Randa Evens, ED Scribe. This patient was seen in room MH09/MH09 and the patient's care was started at 7:45 PM.      Chief Complaint  Patient presents with  . Wound Check    Patient is a 73 y.o. female presenting with wound check. The history is provided by the patient. No language interpreter was used.  Wound Check   HPI Comments: [name] is a 73 y.o. female who presents to the Emergency Department complaining of wound onset today. She states that her severity of her pain is 8/10. She states that movement worsens the pain. She states she had the battery replaced in her pacemaker yesterday. She states she has been taking her aspirin and Plavix regularly. She states that where the incision was made, she is still showing signs of bleeding. She states the incision site is tender to palpation. There has been no fever, chills, sweats.     Past Medical History  Diagnosis Date  . Coronary artery disease     Cath 04/2006 normal coronaries, mild-mod slow flow in all coronaries with slow distall runoff, mild-mod LV enlargement, EF 55%   . Hypertension   . Hyperlipemia   . Arthritis   . CHF (congestive heart failure)     05/2011 echo severe concentric LVH, normal systolic fxn, grade 1 diastolic dysfxn, paradoxical septal motion  . Vertigo     takes meclizine daily  . 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 Hypercholesterolemia   . Morbid obesity   . GERD (gastroesophageal reflux disease)   . Dysrhythmia   . Peripheral vascular disease   . 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 Depression   . Sleep apnea     needs cpap but does not wear  . Nocturia   . MI (myocardial infarction)     NOT SURE HOW MANY  . Pneumonia NONE RECENT  . Stroke    Past Surgical History  Procedure Laterality Date  . Pacemaker  insertion    . Pacemaker insertion    . Cardiac catheterization    . Dilation and curettage of uterus    . Insert / replace / remove pacemaker    . Tubal ligation    . Vascular surgery      vericose vein surgery  . Abdominal hysterectomy    . Cataract extraction w/phaco Left 08/29/2013    Procedure: CATARACT EXTRACTION PHACO AND INTRAOCULAR LENS PLACEMENT (IOC);  Surgeon: Adonis Brook, MD;  Location: [place];  Service: Ophthalmology;  Laterality: Left;  . Colonoscopy with propofol N/A 01/07/2014    Procedure: COLONOSCOPY WITH PROPOFOL;  Surgeon: Juanita Craver, MD;  Location[facility];  Service: Endoscopy;  Laterality: N/A;   No family history on file. History  Substance Use Topics  . Smoking status: Never Smoker   . Smokeless tobacco: Never Used  . Alcohol Use: No   OB History   Grav Para Term Preterm Abortions TAB SAB Ect Mult Living                 Review of Systems  Skin: Positive for wound.  All other systems reviewed and are negative.  Allergies  Ampicillin  Home Medications   Prior to Admission medications   Medication Sig Start Date End Date Taking?  Authorizing Provider  aspirin 81 MG chewable tablet Chew 1 tablet (81 mg total) by mouth daily. 10/19/12   Ripudeep Krystal Eaton, MD  clopidogrel (PLAVIX) 75 MG tablet Take 75 mg by mouth every morning.     Historical Provider, MD  diltiazem (CARDIZEM CD) 360 MG 24 hr capsule Take 360 mg by mouth every morning.     Historical Provider, MD  furosemide (LASIX) 40 MG tablet Take 40 mg by mouth every morning.    Historical Provider, MD  hydrALAZINE (APRESOLINE) 25 MG tablet Take 25 mg by mouth 3 (three) times daily.     Historical Provider, MD  HYDROcodone-acetaminophen (NORCO/VICODIN) 5-325 MG per tablet Take 1 tablet by mouth every 6 (six) hours as needed. For pain 09/11/11   Theodis Blaze, MD  isosorbide mononitrate (IMDUR) 60 MG 24 hr tablet Take 60 mg by mouth every morning.     Historical Provider, MD  meclizine (ANTIVERT) 25  MG tablet Take 12.5-25 mg by mouth 3 (three) times daily as needed. For dizziness    Historical Provider, MD  metoCLOPramide (REGLAN) 10 MG tablet Take 10 mg by mouth every 6 (six) hours as needed for nausea.     Historical Provider, MD  nitroGLYCERIN (NITROSTAT) 0.4 MG SL tablet Place 0.4 mg under the tongue every 5 (five) minutes as needed for chest pain.     Historical Provider, MD  pantoprazole (PROTONIX) 40 MG tablet Take 40 mg by mouth daily.     Historical Provider, MD  potassium chloride SA (K-DUR,KLOR-CON) 20 MEQ tablet Take 20 mEq by mouth daily.     Historical Provider, MD  rosuvastatin (CRESTOR) 10 MG tablet Take 10 mg by mouth every evening.     Historical Provider, MD  traMADol (ULTRAM) 50 MG tablet Take 50 mg by mouth 2 (two) times daily as needed for pain.     Historical Provider, MD   Triage Vitals: BP 148/88  Pulse 67  Temp(Src) 99.5 F (37.5 C) (Oral)  Resp 18  SpO2 97%  Physical Exam  Nursing note and vitals reviewed. Constitutional: She is oriented to person, place, and time. She appears well-developed and well-nourished. No distress.  HENT:  Head: Normocephalic and atraumatic.  Eyes: Conjunctivae and EOM are normal. Pupils are equal, round, and reactive to light.  Neck: Normal range of motion. Neck supple. No JVD present.  Cardiovascular: Normal rate and regular rhythm.   No murmur heard. Pulmonary/Chest: Effort normal and breath sounds normal. No respiratory distress. She has no wheezes. She has no rales.  Dressing in place right subclavian. Mild ecchymosis and mild swelling, mild to moderate tenderness present in areas of swelling, surgical staples in place, and wound appears clean without signs of infection, no active drainage from wound but dressing has moderate amount of blood, epidermal avulsion occurred at margins of op-site dressing  Abdominal: Soft. Bowel sounds are normal. She exhibits no mass. There is no tenderness.  Musculoskeletal: Normal range of  motion. She exhibits no edema.  Lymphadenopathy:    She has no cervical adenopathy.  Neurological: She is alert and oriented to person, place, and time. No cranial nerve deficit. She exhibits normal muscle tone. Coordination normal.  Skin: Skin is warm and dry. No rash noted.  Psychiatric: She has a normal mood and affect. Her behavior is normal. Thought content normal.    ED Course  Procedures (including critical care time) DIAGNOSTIC STUDIES: Oxygen Saturation is 97% on RA, adequate by my interpretation.    COORDINATION OF  CARE: 7:51 PM-Discussed treatment plan which includes sterile dressing, pain medication, follow up with cardiologist tomorrow morning with pt at bedside and pt agreed to plan.    MDM   Final diagnoses:  Post-op pain    Pain and mild bleeding from incision for pacemaker battery replacement. Old records are reviewed and surgery for pacemaker replacement yesterday was unremarkable. She shows no signs of infection on clinical exam and area of tenderness seems to be confined to the area over the pacemaker pocket. There is no ongoing bleeding but I suspect that she has a hematoma in the pocket which is causing her pain. New dressing is applied. Of note, there was some desquamation of the skin where the dressing was removed and this is treated with topical bacitracin. She is to followup with her cardiologist.  I personally performed the services described in this documentation, which was scribed in my presence. The recorded information has been reviewed and is accurate.       Delora Fuel, MD [id number]

## 2014-03-28 NOTE — ED Notes (Signed)
She had her pacemaker battery replaced yesterday. Her insertion site has been oozing blood since.

## 2014-03-28 NOTE — Telephone Encounter (Signed)
Closed encounter °

## 2014-03-29 ENCOUNTER — Telehealth: Payer: Self-pay | Admitting: Cardiovascular Disease

## 2014-03-29 NOTE — Telephone Encounter (Signed)
Pt have had her battery in pacemaker on Wednesday and had problems on Thursday. She went To [facility] in [place] told him to call here today and tell you what actually took place.Please call so he can give you this information.

## 2014-03-29 NOTE — Telephone Encounter (Signed)
[name] and [name] are working on this issue from [address].
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
## 2014-04-09 ENCOUNTER — Encounter: Payer: Self-pay | Admitting: Cardiology

## 2014-04-09 ENCOUNTER — Ambulatory Visit (INDEPENDENT_AMBULATORY_CARE_PROVIDER_SITE_OTHER): Payer: Medicare Other | Admitting: Cardiology

## 2014-04-09 VITALS — BP 132/80 | HR 60 | Ht 66.0 in | Wt 336.0 lb

## 2014-04-09 DIAGNOSIS — Z950 Presence of cardiac pacemaker: Secondary | ICD-10-CM

## 2014-04-09 NOTE — Progress Notes (Signed)
[name], female   DOB: 12-Apr-1941, 73 y.o.   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 Sallyanne Kuster on 03/27/2014.  She states that she has been doing well since her procedure. She denies any signs and symptoms of local or systemic infection. She denies drainage, swelling, erythema, pain, fevers and chills.     Current Outpatient Prescriptions  Medication Sig Dispense Refill  . aspirin 81 MG chewable tablet Chew 1 tablet (81 mg total) by mouth daily.      . clopidogrel (PLAVIX) 75 MG tablet Take 75 mg by mouth every morning.       . diltiazem (CARDIZEM CD) 360 MG 24 hr capsule Take 360 mg by mouth every morning.       . furosemide (LASIX) 40 MG tablet Take 40 mg by mouth every morning.      . hydrALAZINE (APRESOLINE) 25 MG tablet Take 25 mg by mouth 3 (three) times daily.       [name] Kitchen HYDROcodone-acetaminophen (NORCO/VICODIN) 5-325 MG per tablet Take 1 tablet by mouth every 6 (six) hours as needed. For pain      . isosorbide mononitrate (IMDUR) 60 MG 24 hr tablet Take 60 mg by mouth every morning.       . meclizine (ANTIVERT) 25 MG tablet Take 12.5-25 mg by mouth 3 (three) times daily as needed. For dizziness      . metoCLOPramide (REGLAN) 10 MG tablet Take 10 mg by mouth every 6 (six) hours as needed for nausea.       . nitroGLYCERIN (NITROSTAT) 0.4 MG SL tablet Place 0.4 mg under the tongue every 5 (five) minutes as needed for chest pain.       . pantoprazole (PROTONIX) 40 MG tablet Take 40 mg by mouth daily.       . potassium chloride SA  (K-DUR,KLOR-CON) 20 MEQ tablet Take 20 mEq by mouth daily.       . rosuvastatin (CRESTOR) 10 MG tablet Take 10 mg by mouth every evening.       . traMADol (ULTRAM) 50 MG tablet Take 50 mg by mouth 2 (two) times daily as needed for pain.       . traMADol (ULTRAM) 50 MG tablet Take 1 tablet (50 mg total) by mouth every 6 (six) hours as needed.  15 tablet  0   No current facility-administered medications for this visit.    Allergies  Allergen Reactions  . Ampicillin Other (See Comments)    Increases heart rhythm     History   Social History  . Marital Status: Widowed    Spouse Name: N/A    Number of Children: N/A  . Years of Education: N/A   Occupational History  . Not on file.   Social History Main Topics  . Smoking status: Never Smoker   . Smokeless tobacco: Never Used  . Alcohol Use: No  . Drug Use: No  . Sexual Activity: No   Other Topics Concern  . Not on file   Social History Narrative  . No narrative on file     Review of Systems:  General: negative for chills, fever, night sweats or weight changes.  Cardiovascular: negative for chest pain, dyspnea on exertion, edema, orthopnea, palpitations, paroxysmal nocturnal dyspnea or shortness of breath Dermatological: negative for rash Respiratory: negative for cough or wheezing Urologic: negative for hematuria Abdominal: negative for nausea, vomiting, diarrhea, bright red blood per rectum, melena, or hematemesis Neurologic: negative for visual changes, syncope, or dizziness All other systems reviewed and are otherwise negative except as noted above.    Blood pressure 132/80, pulse 60, height 5\' 6"  (1.676 m), weight 336 lb (152.409 kg).  General appearance: alert, cooperative and no distress Neck: no carotid bruit and no JVD Lungs: clear to auscultation bilaterally Heart: regular rate and rhythm, S1, S2 normal, no murmur, click, rub or gallop Extremities: trace bilateral LEE Pulses: 2+ and symmetric Skin: warm and  dry; right-sided wound incision: Well healed, surrounding skin is intact, no or edema, swelling or drainage. Neurologic: Grossly normal  EKG: not performed  ASSESSMENT AND PLAN:   1. Permanent pacemaker: For third-degree AV block and symptomatic bradycardia. Status post recent generator change out, due the ERI on 03/27/2014. The skin surrounding the wound is intact and well healed, without erythema, swelling and drainage. The staples were removed without difficulty and Betadine was applied to the area. She was advised to avoid standing water including bathtubs, swimming pools and hot tubs for at least another 3-4 weeks. She has been instructed to report any signs and symptoms of local or systemic infection to Dr. Doylene Canard.  PLAN:  Followup with Dr. Doylene Canard for routine cardiovascular care. No charge visit.  Lillieanna Tuohy, BRITTAINYPA-C 04/09/2014 2:26 PM

## 2014-10-03 ENCOUNTER — Encounter (HOSPITAL_COMMUNITY): Payer: Self-pay | Admitting: Cardiovascular Disease

## 2015-06-16 ENCOUNTER — Emergency Department (HOSPITAL_BASED_OUTPATIENT_CLINIC_OR_DEPARTMENT_OTHER)
Admission: EM | Admit: 2015-06-16 | Discharge: 2015-06-16 | Disposition: A | Discharge status: Home / Self Care | Payer: Medicare Other | Attending: Emergency Medicine | Admitting: Emergency Medicine

## 2015-06-16 ENCOUNTER — Emergency Department (HOSPITAL_BASED_OUTPATIENT_CLINIC_OR_DEPARTMENT_OTHER): Payer: Medicare Other

## 2015-06-16 DIAGNOSIS — E785 Hyperlipidemia, unspecified: Secondary | ICD-10-CM | POA: Diagnosis not present

## 2015-06-16 DIAGNOSIS — I10 Essential (primary) hypertension: Secondary | ICD-10-CM | POA: Diagnosis not present

## 2015-06-16 DIAGNOSIS — Z9889 Other specified postprocedural states: Secondary | ICD-10-CM | POA: Insufficient documentation

## 2015-06-16 DIAGNOSIS — F329 Major depressive disorder, single episode, unspecified: Secondary | ICD-10-CM | POA: Insufficient documentation

## 2015-06-16 DIAGNOSIS — Z950 Presence of cardiac pacemaker: Secondary | ICD-10-CM | POA: Insufficient documentation

## 2015-06-16 DIAGNOSIS — Z8669 Personal history of other diseases of the nervous system and sense organs: Secondary | ICD-10-CM | POA: Diagnosis not present

## 2015-06-16 DIAGNOSIS — I252 Old myocardial infarction: Secondary | ICD-10-CM | POA: Diagnosis not present

## 2015-06-16 DIAGNOSIS — I2510 Atherosclerotic heart disease of native coronary artery without angina pectoris: Secondary | ICD-10-CM | POA: Insufficient documentation

## 2015-06-16 DIAGNOSIS — Z8701 Personal history of pneumonia (recurrent): Secondary | ICD-10-CM | POA: Insufficient documentation

## 2015-06-16 DIAGNOSIS — Z79899 Other long term (current) drug therapy: Secondary | ICD-10-CM | POA: Insufficient documentation

## 2015-06-16 DIAGNOSIS — E780 Pure hypercholesterolemia: Secondary | ICD-10-CM | POA: Diagnosis not present

## 2015-06-16 DIAGNOSIS — K219 Gastro-esophageal reflux disease without esophagitis: Secondary | ICD-10-CM | POA: Insufficient documentation

## 2015-06-16 DIAGNOSIS — Z8673 Personal history of transient ischemic attack (TIA), and cerebral infarction without residual deficits: Secondary | ICD-10-CM | POA: Insufficient documentation

## 2015-06-16 DIAGNOSIS — J209 Acute bronchitis, unspecified: Secondary | ICD-10-CM | POA: Insufficient documentation

## 2015-06-16 DIAGNOSIS — M1990 Unspecified osteoarthritis, unspecified site: Secondary | ICD-10-CM | POA: Diagnosis not present

## 2015-06-16 DIAGNOSIS — Z880 Allergy status to penicillin: Secondary | ICD-10-CM | POA: Diagnosis not present

## 2015-06-16 DIAGNOSIS — Z7982 Long term (current) use of aspirin: Secondary | ICD-10-CM | POA: Diagnosis not present

## 2015-06-16 DIAGNOSIS — I509 Heart failure, unspecified: Secondary | ICD-10-CM | POA: Insufficient documentation

## 2015-06-16 DIAGNOSIS — R05 Cough: Secondary | ICD-10-CM | POA: Diagnosis present

## 2015-06-16 LAB — COMPREHENSIVE METABOLIC PANEL
ALT: 8 U/L — AB (ref 14–54)
AST: 18 U/L (ref 15–41)
Albumin: 3.4 g/dL — ABNORMAL LOW (ref 3.5–5.0)
Alkaline Phosphatase: 90 U/L (ref 38–126)
Anion gap: 7 (ref 5–15)
BUN: 21 mg/dL — AB (ref 6–20)
CHLORIDE: 112 mmol/L — AB (ref 101–111)
CO2: 24 mmol/L (ref 22–32)
CREATININE: 1.37 mg/dL — AB (ref 0.44–1.00)
Calcium: 8.8 mg/dL — ABNORMAL LOW (ref 8.9–10.3)
GFR calc Af Amer: 44 mL/min — ABNORMAL LOW (ref >60)
GFR, EST NON AFRICAN AMERICAN: 38 mL/min — AB (ref >60)
GLUCOSE: 131 mg/dL — AB (ref 65–99)
Potassium: 3.8 mmol/L (ref 3.5–5.1)
Sodium: 143 mmol/L (ref 135–145)
Total Bilirubin: 0.5 mg/dL (ref 0.3–1.2)
Total Protein: 7.1 g/dL (ref 6.5–8.1)

## 2015-06-16 LAB — CBC WITH DIFFERENTIAL/PLATELET
Basophils Absolute: 0 10*3/uL (ref 0.0–0.1)
Basophils Relative: 1 % (ref 0–1)
Eosinophils Absolute: 0.4 10*3/uL (ref 0.0–0.7)
Eosinophils Relative: 6 % — ABNORMAL HIGH (ref 0–5)
HCT: 40.9 % (ref 36.0–46.0)
HEMOGLOBIN: 13 g/dL (ref 12.0–15.0)
LYMPHS ABS: 1.8 10*3/uL (ref 0.7–4.0)
Lymphocytes Relative: 29 % (ref 12–46)
MCH: 30 pg (ref 26.0–34.0)
MCHC: 31.8 g/dL (ref 30.0–36.0)
MCV: 94.5 fL (ref 78.0–100.0)
Monocytes Absolute: 0.5 10*3/uL (ref 0.1–1.0)
Monocytes Relative: 9 % (ref 3–12)
NEUTROS PCT: 57 % (ref 43–77)
Neutro Abs: 3.6 10*3/uL (ref 1.7–7.7)
Platelets: 222 10*3/uL (ref 150–400)
RBC: 4.33 MIL/uL (ref 3.87–5.11)
RDW: 15.9 % — ABNORMAL HIGH (ref 11.5–15.5)
WBC: 6.3 10*3/uL (ref 4.0–10.5)

## 2015-06-16 LAB — TROPONIN I: Troponin I: <0.03 ng/mL (ref <0.031)

## 2015-06-16 MED ORDER — AEROCHAMBER PLUS W/MASK MISC
1.0000 | Freq: Once | Status: DC
  Filled 2015-06-16: qty 1

## 2015-06-16 MED ORDER — IPRATROPIUM-ALBUTEROL 0.5-2.5 (3) MG/3ML IN SOLN
3.0000 mL | RESPIRATORY_TRACT | Status: DC
  Administered 2015-06-16: 3 mL via RESPIRATORY_TRACT
  Filled 2015-06-16: qty 3

## 2015-06-16 MED ORDER — DOXYCYCLINE HYCLATE 100 MG PO TABS
100.0000 mg | ORAL_TABLET | Freq: Once | ORAL | Status: AC
  Administered 2015-06-16: 100 mg via ORAL
  Filled 2015-06-16: qty 1

## 2015-06-16 MED ORDER — ALBUTEROL SULFATE HFA 108 (90 BASE) MCG/ACT IN AERS
2.0000 | INHALATION_SPRAY | RESPIRATORY_TRACT | Status: DC | PRN
  Administered 2015-06-16: 2 via RESPIRATORY_TRACT
  Filled 2015-06-16: qty 6.7

## 2015-06-16 MED ORDER — DOXYCYCLINE HYCLATE 100 MG PO CAPS: 100.0000 mg | ORAL_CAPSULE | Freq: Two times a day (BID) | ORAL | Status: DC

## 2015-06-16 NOTE — ED Provider Notes (Signed)
CSN: [id number]     Arrival date & time 06/16/15  0222 History   First MD Initiated Contact with Patient 06/16/15 0335     Chief Complaint  Patient presents with  . Cough     (Consider location/radiation/quality/duration/timing/severity/associated sxs/prior Treatment) HPI  This is a 74 year old female with a three-day history of cough. The cough is nonproductive and persistent. She has also had some intermittent right-sided chest pain. She describes the pain as throbbing and occurs with coughing or deep breathing. The pain is mild to moderate. She was noted to have a low-grade fever on arrival. She has used an inhaler in the past but not presently. She has had some wheezing with this. She has not short of breath it rest but has been short of breath with exertion; she feels like she cannot take in a full deep breath. The chest pain is not exertional. She has chronic edema of the lower legs and takes Lasix for CHF.  Past Medical History  Diagnosis Date  . Coronary artery disease     Cath 04/2006 normal coronaries, mild-mod slow flow in all coronaries with slow distall runoff, mild-mod LV enlargement, EF 55%   . Hypertension   . Hyperlipemia   . Arthritis   . CHF (congestive heart failure)     05/2011 echo severe concentric LVH, normal systolic fxn, grade 1 diastolic dysfxn, paradoxical septal motion  . Vertigo     takes meclizine daily  . 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 Hypercholesterolemia   . Morbid obesity   . GERD (gastroesophageal reflux disease)   . Dysrhythmia   . Peripheral vascular disease   . 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 Depression   . Sleep apnea     needs cpap but does not wear  . Nocturia   . MI (myocardial infarction)     NOT SURE HOW MANY  . Pneumonia NONE RECENT  . Stroke    Past Surgical History  Procedure Laterality Date  . Pacemaker insertion    . Pacemaker insertion    . Cardiac  catheterization    . Dilation and curettage of uterus    . Insert / replace / remove pacemaker    . Tubal ligation    . Vascular surgery      vericose vein surgery  . Abdominal hysterectomy    . Cataract extraction w/phaco Left 08/29/2013    Procedure: CATARACT EXTRACTION PHACO AND INTRAOCULAR LENS PLACEMENT (IOC);  Surgeon: Adonis Brook, MD;  Location: [facility];  Service: Ophthalmology;  Laterality: Left;  . Colonoscopy with propofol N/A 01/07/2014    Procedure: COLONOSCOPY WITH PROPOFOL;  Surgeon: Juanita Craver, MD;  Location: [facility];  Service: Endoscopy;  Laterality: N/A;  . Pacemaker generator change N/A 03/27/2014    Procedure: PACEMAKER GENERATOR CHANGE;  Surgeon: Sanda Klein, MD;  Location: [facility];  Service: Cardiovascular;  Laterality: N/A;   No family history on file. Social History  Substance Use Topics  . Smoking status: Never Smoker   . Smokeless tobacco: Never Used  . Alcohol Use: No   OB History    No data available     Review of Systems  All other systems reviewed and are negative.   Allergies  Ampicillin  Home Medications   Prior to Admission medications   Medication Sig Start Date End Date Taking? Authorizing Provider  aspirin 81 MG chewable tablet  Chew 1 tablet (81 mg total) by mouth daily. 10/19/12   Ripudeep Krystal Eaton, MD  clopidogrel (PLAVIX) 75 MG tablet Take 75 mg by mouth every morning.     Historical Provider, MD  diltiazem (CARDIZEM CD) 360 MG 24 hr capsule Take 360 mg by mouth every morning.     Historical Provider, MD  furosemide (LASIX) 40 MG tablet Take 40 mg by mouth every morning.    Historical Provider, MD  hydrALAZINE (APRESOLINE) 25 MG tablet Take 25 mg by mouth 3 (three) times daily.     Historical Provider, MD  HYDROcodone-acetaminophen (NORCO/VICODIN) 5-325 MG per tablet Take 1 tablet by mouth every 6 (six) hours as needed. For pain 09/11/11   Theodis Blaze, MD  isosorbide mononitrate (IMDUR) 60 MG 24 hr tablet Take 60 mg by mouth every  morning.     Historical Provider, MD  meclizine (ANTIVERT) 25 MG tablet Take 12.5-25 mg by mouth 3 (three) times daily as needed. For dizziness    Historical Provider, MD  metoCLOPramide (REGLAN) 10 MG tablet Take 10 mg by mouth every 6 (six) hours as needed for nausea.     Historical Provider, MD  nitroGLYCERIN (NITROSTAT) 0.4 MG SL tablet Place 0.4 mg under the tongue every 5 (five) minutes as needed for chest pain.     Historical Provider, MD  pantoprazole (PROTONIX) 40 MG tablet Take 40 mg by mouth daily.     Historical Provider, MD  potassium chloride SA (K-DUR,KLOR-CON) 20 MEQ tablet Take 20 mEq by mouth daily.     Historical Provider, MD  rosuvastatin (CRESTOR) 10 MG tablet Take 10 mg by mouth every evening.     Historical Provider, MD  traMADol (ULTRAM) 50 MG tablet Take 50 mg by mouth 2 (two) times daily as needed for pain.     Historical Provider, MD  traMADol (ULTRAM) 50 MG tablet Take 1 tablet (50 mg total) by mouth every 6 (six) hours as needed. [id number]   Delora Fuel, MD   BP Q000111Q mmHg  Pulse 69  Temp(Src) 99.2 F (37.3 C) (Oral)  Resp 20  Ht 5\' 6"  (1.676 m)  Wt 299 lb (135.626 kg)  BMI 48.28 kg/m2  SpO2 94%   Physical Exam  General: Well-developed, obese female in no acute distress; appearance consistent with age of record HENT: normocephalic; atraumatic; poor dentition Eyes: pupils equal, round and reactive to light; extraocular muscles intact; left lens implant Neck: supple Heart: regular rate and rhythm; distant sounds Lungs: Decreased air movement bilaterally without frank wheezing; occasional paroxysms of cough Chest: Right parasternal tenderness that reproduces the pain reported in the history of present illness Abdomen: soft; obese; mild diffuse tenderness; bowel sounds present Extremities: No deformity; full range of motion; 2+ edema of lower legs Neurologic: Awake, alert and oriented; motor function intact in all extremities and symmetric; no facial droop Skin:  Warm and dry Psychiatric: Normal mood and affect    ED Course  Procedures (including critical care time)   EKG Interpretation   Date/Time:  Monday June 16 2015 02:57:01 EDT Ventricular Rate:  66 PR Interval:  276 QRS Duration: 130 QT Interval:  450 QTC Calculation: 471 R Axis:   67 Text Interpretation:  Atrial-paced rhythm with prolonged AV conduction  Left bundle branch block Cannot rule out Anterior infarct , age  undetermined T wave abnormality, consider inferolateral ischemia Abnormal  ECG Axis has changed Confirmed by Florina Ou  MD, Jenny Reichmann ([id number]) on 06/16/2015  3:36:26 AM  MDM  Nursing notes and vitals signs, including pulse oximetry, reviewed.  Summary of this visit's results, reviewed by myself:  Labs:  Results for orders placed or performed during the hospital encounter of 06/16/15 (from the past 24 hour(s))  CBC with Differential     Status: Abnormal   Collection Time: 06/16/15  3:20 AM  Result Value Ref Range   WBC 6.3 4.0 - 10.5 K/uL   RBC 4.33 3.87 - 5.11 MIL/uL   Hemoglobin 13.0 12.0 - 15.0 g/dL   HCT 40.9 36.0 - 46.0 %   MCV 94.5 78.0 - 100.0 fL   MCH 30.0 26.0 - 34.0 pg   MCHC 31.8 30.0 - 36.0 g/dL   RDW 15.9 (H) 11.5 - 15.5 %   Platelets 222 150 - 400 K/uL   Neutrophils Relative % 57 43 - 77 %   Neutro Abs 3.6 1.7 - 7.7 K/uL   Lymphocytes Relative 29 12 - 46 %   Lymphs Abs 1.8 0.7 - 4.0 K/uL   Monocytes Relative 9 3 - 12 %   Monocytes Absolute 0.5 0.1 - 1.0 K/uL   Eosinophils Relative 6 (H) 0 - 5 %   Eosinophils Absolute 0.4 0.0 - 0.7 K/uL   Basophils Relative 1 0 - 1 %   Basophils Absolute 0.0 0.0 - 0.1 K/uL  Troponin I     Status: None   Collection Time: 06/16/15  3:20 AM  Result Value Ref Range   Troponin I <0.03 <0.031 ng/mL  Comprehensive metabolic panel     Status: Abnormal   Collection Time: 06/16/15  3:20 AM  Result Value Ref Range   Sodium 143 135 - 145 mmol/L   Potassium 3.8 3.5 - 5.1 mmol/L   Chloride 112 (H) 101 - 111 mmol/L    CO2 24 22 - 32 mmol/L   Glucose, Bld 131 (H) 65 - 99 mg/dL   BUN 21 (H) 6 - 20 mg/dL   Creatinine, Ser 1.37 (H) 0.44 - 1.00 mg/dL   Calcium 8.8 (L) 8.9 - 10.3 mg/dL   Total Protein 7.1 6.5 - 8.1 g/dL   Albumin 3.4 (L) 3.5 - 5.0 g/dL   AST 18 15 - 41 U/L   ALT 8 (L) 14 - 54 U/L   Alkaline Phosphatase 90 38 - 126 U/L   Total Bilirubin 0.5 0.3 - 1.2 mg/dL   GFR calc non Af Amer 38 (L) >60 mL/min   GFR calc Af Amer 44 (L) >60 mL/min   Anion gap 7 5 - 15    Imaging Studies: Dg Chest 2 View  06/16/2015   CLINICAL DATA:  Cough. Right-sided chest pain with left hand numbness.  EXAM: CHEST  2 VIEW  COMPARISON:  02/07/2013  FINDINGS: Dual lead right-sided pacemaker in place. The heart is enlarged. There is tortuosity of the thoracic aorta. The lungs are hyperinflated. No pulmonary edema, confluent airspace disease, pleural effusion or pneumothorax. No acute osseous abnormality.  IMPRESSION: Chronic cardiomegaly and tortuosity of thoracic aorta. No acute process.   Electronically Signed   By: Jeb Levering M.D.   On: 06/16/2015 03:42   4:19 AM Air movement improved after DuoNeb treatment.  4:57 AM We'll treat for acute bronchitis.   Shanon Rosser, MD 06/16/15 [phone]

## 2015-06-16 NOTE — Discharge Instructions (Signed)

## 2015-06-16 NOTE — ED Notes (Signed)
Patient transported to X-ray 

## 2015-06-16 NOTE — ED Notes (Addendum)
Pt reports right chest pain but denies radiation but sometimes her left hand gets numb. Pt reports cardiac hx with pacemaker placement 2003 by Dr Glade Lloyd

## 2015-06-19 ENCOUNTER — Emergency Department (HOSPITAL_BASED_OUTPATIENT_CLINIC_OR_DEPARTMENT_OTHER)
Admission: EM | Admit: 2015-06-19 | Discharge: 2015-06-19 | Disposition: A | Discharge status: Home / Self Care | Payer: Medicare Other | Attending: Emergency Medicine | Admitting: Emergency Medicine

## 2015-06-19 ENCOUNTER — Encounter (HOSPITAL_BASED_OUTPATIENT_CLINIC_OR_DEPARTMENT_OTHER): Payer: Self-pay | Admitting: *Deleted

## 2015-06-19 DIAGNOSIS — Z8701 Personal history of pneumonia (recurrent): Secondary | ICD-10-CM | POA: Diagnosis not present

## 2015-06-19 DIAGNOSIS — F329 Major depressive disorder, single episode, unspecified: Secondary | ICD-10-CM | POA: Diagnosis not present

## 2015-06-19 DIAGNOSIS — T364X5A Adverse effect of tetracyclines, initial encounter: Secondary | ICD-10-CM | POA: Insufficient documentation

## 2015-06-19 DIAGNOSIS — T3695XA Adverse effect of unspecified systemic antibiotic, initial encounter: Secondary | ICD-10-CM

## 2015-06-19 DIAGNOSIS — Z950 Presence of cardiac pacemaker: Secondary | ICD-10-CM | POA: Diagnosis not present

## 2015-06-19 DIAGNOSIS — Z8673 Personal history of transient ischemic attack (TIA), and cerebral infarction without residual deficits: Secondary | ICD-10-CM | POA: Diagnosis not present

## 2015-06-19 DIAGNOSIS — R1110 Vomiting, unspecified: Secondary | ICD-10-CM | POA: Insufficient documentation

## 2015-06-19 DIAGNOSIS — E785 Hyperlipidemia, unspecified: Secondary | ICD-10-CM | POA: Diagnosis not present

## 2015-06-19 DIAGNOSIS — E780 Pure hypercholesterolemia: Secondary | ICD-10-CM | POA: Diagnosis not present

## 2015-06-19 DIAGNOSIS — M1990 Unspecified osteoarthritis, unspecified site: Secondary | ICD-10-CM | POA: Insufficient documentation

## 2015-06-19 DIAGNOSIS — Z8669 Personal history of other diseases of the nervous system and sense organs: Secondary | ICD-10-CM | POA: Insufficient documentation

## 2015-06-19 DIAGNOSIS — I252 Old myocardial infarction: Secondary | ICD-10-CM | POA: Diagnosis not present

## 2015-06-19 DIAGNOSIS — Z7902 Long term (current) use of antithrombotics/antiplatelets: Secondary | ICD-10-CM | POA: Diagnosis not present

## 2015-06-19 DIAGNOSIS — Z7982 Long term (current) use of aspirin: Secondary | ICD-10-CM | POA: Diagnosis not present

## 2015-06-19 DIAGNOSIS — I509 Heart failure, unspecified: Secondary | ICD-10-CM | POA: Insufficient documentation

## 2015-06-19 DIAGNOSIS — I10 Essential (primary) hypertension: Secondary | ICD-10-CM | POA: Diagnosis not present

## 2015-06-19 DIAGNOSIS — Z79899 Other long term (current) drug therapy: Secondary | ICD-10-CM | POA: Insufficient documentation

## 2015-06-19 DIAGNOSIS — K219 Gastro-esophageal reflux disease without esophagitis: Secondary | ICD-10-CM | POA: Insufficient documentation

## 2015-06-19 DIAGNOSIS — I2510 Atherosclerotic heart disease of native coronary artery without angina pectoris: Secondary | ICD-10-CM | POA: Diagnosis not present

## 2015-06-19 MED ORDER — AZITHROMYCIN 250 MG PO TABS: 250.0000 mg | ORAL_TABLET | Freq: Every day | ORAL | Status: DC

## 2015-06-19 NOTE — ED Notes (Signed)
Seen here on 8/19 and started on doxycycline. States she is vomiting after each dose. Requesting med change

## 2015-06-19 NOTE — ED Provider Notes (Signed)
CSN: [id number]     Arrival date & time 06/19/15  1042 History   First MD Initiated Contact with Patient 06/19/15 1059     Chief Complaint  Patient presents with  . Medication Reaction     (Consider location/radiation/quality/duration/timing/severity/associated sxs/prior Treatment) HPI Comments: Patient is a 74 year old female with a history of coronary artery disease, hypertension, hyperlipidemia, morbid obesity, CHF status post pacemaker and stroke who was seen here approximately 3 days ago and diagnosed with bronchitis. At that time she was started on doxycycline however she states every time she takes the doxycycline she vomits. She is here requesting a different antibiotic.  She denies any worsening of her symptoms, new fever or change in her productive cough. Vomiting is only related to the doxycycline as long as she has not taken that medication she is able to eat and hold down food  The history is provided by the patient.    Past Medical History  Diagnosis Date  . Coronary artery disease     Cath 04/2006 normal coronaries, mild-mod slow flow in all coronaries with slow distall runoff, mild-mod LV enlargement, EF 55%   . Hypertension   . Hyperlipemia   . Arthritis   . CHF (congestive heart failure)     05/2011 echo severe concentric LVH, normal systolic fxn, grade 1 diastolic dysfxn, paradoxical septal motion  . Vertigo     takes meclizine daily  . 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 Hypercholesterolemia   . Morbid obesity   . GERD (gastroesophageal reflux disease)   . Dysrhythmia   . Peripheral vascular disease   . 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 Depression   . Sleep apnea     needs cpap but does not wear  . Nocturia   . MI (myocardial infarction)     NOT SURE HOW MANY  . Pneumonia NONE RECENT  . Stroke    Past Surgical History  Procedure Laterality Date  . Pacemaker insertion    . Pacemaker  insertion    . Cardiac catheterization    . Dilation and curettage of uterus    . Insert / replace / remove pacemaker    . Tubal ligation    . Vascular surgery      vericose vein surgery  . Abdominal hysterectomy    . Cataract extraction w/phaco Left 08/29/2013    Procedure: CATARACT EXTRACTION PHACO AND INTRAOCULAR LENS PLACEMENT (IOC);  Surgeon: Adonis Brook, MD;  Location: [facility];  Service: Ophthalmology;  Laterality: Left;  . Colonoscopy with propofol N/A 01/07/2014    Procedure: COLONOSCOPY WITH PROPOFOL;  Surgeon: Juanita Craver, MD;  Location: [facility];  Service: Endoscopy;  Laterality: N/A;  . Pacemaker generator change N/A 03/27/2014    Procedure: PACEMAKER GENERATOR CHANGE;  Surgeon: Sanda Klein, MD;  Location: [facility];  Service: Cardiovascular;  Laterality: N/A;   No family history on file. Social History  Substance Use Topics  . Smoking status: Never Smoker   . Smokeless tobacco: Never Used  . Alcohol Use: No   OB History    No data available     Review of Systems  All other systems reviewed and are negative.     Allergies  Ampicillin  Home Medications   Prior to Admission medications   Medication Sig Start Date End Date Taking? Authorizing Provider  aspirin 81 MG chewable tablet Chew 1 tablet (81 mg  total) by mouth daily. 10/19/12   Ripudeep Krystal Eaton, MD  azithromycin (ZITHROMAX) 250 MG tablet Take 1 tablet (250 mg total) by mouth daily. Take first 2 tablets together, then 1 every day until finished. 06/19/15   Blanchie Dessert, MD  clopidogrel (PLAVIX) 75 MG tablet Take 75 mg by mouth every morning.     Historical Provider, MD  diltiazem (CARDIZEM CD) 360 MG 24 hr capsule Take 360 mg by mouth every morning.     Historical Provider, MD  doxycycline (VIBRAMYCIN) 100 MG capsule Take 1 capsule (100 mg total) by mouth 2 (two) times daily. One po bid x 7 days 06/16/15   Shanon Rosser, MD  furosemide (LASIX) 40 MG tablet Take 40 mg by mouth every morning.    Historical  Provider, MD  hydrALAZINE (APRESOLINE) 25 MG tablet Take 25 mg by mouth 3 (three) times daily.     Historical Provider, MD  HYDROcodone-acetaminophen (NORCO/VICODIN) 5-325 MG per tablet Take 1 tablet by mouth every 6 (six) hours as needed. For pain 09/11/11   Theodis Blaze, MD  isosorbide mononitrate (IMDUR) 60 MG 24 hr tablet Take 60 mg by mouth every morning.     Historical Provider, MD  meclizine (ANTIVERT) 25 MG tablet Take 12.5-25 mg by mouth 3 (three) times daily as needed. For dizziness    Historical Provider, MD  metoCLOPramide (REGLAN) 10 MG tablet Take 10 mg by mouth every 6 (six) hours as needed for nausea.     Historical Provider, MD  nitroGLYCERIN (NITROSTAT) 0.4 MG SL tablet Place 0.4 mg under the tongue every 5 (five) minutes as needed for chest pain.     Historical Provider, MD  pantoprazole (PROTONIX) 40 MG tablet Take 40 mg by mouth daily.     Historical Provider, MD  potassium chloride SA (K-DUR,KLOR-CON) 20 MEQ tablet Take 20 mEq by mouth daily.     Historical Provider, MD  rosuvastatin (CRESTOR) 10 MG tablet Take 10 mg by mouth every evening.     Historical Provider, MD  traMADol (ULTRAM) 50 MG tablet Take 50 mg by mouth 2 (two) times daily as needed for pain.     Historical Provider, MD  traMADol (ULTRAM) 50 MG tablet Take 1 tablet (50 mg total) by mouth every 6 (six) hours as needed. [id number]   Delora Fuel, MD   BP 99991111 mmHg  Pulse 64  Temp(Src) 98.6 F (37 C) (Oral)  Resp 18  Ht 5\' 6"  (1.676 m)  Wt 299 lb (135.626 kg)  BMI 48.28 kg/m2  SpO2 95% Physical Exam  Constitutional: She is oriented to person, place, and time. She appears well-developed and well-nourished. No distress.  Morbidly obese  HENT:  Head: Normocephalic and atraumatic.  Mouth/Throat: Oropharynx is clear and moist.  Eyes: EOM are normal. Pupils are equal, round, and reactive to light.  Cardiovascular: Normal rate, regular rhythm and intact distal pulses.   Pulmonary/Chest: Effort normal. No  tachypnea. She has decreased breath sounds. She has no wheezes.  Musculoskeletal: She exhibits edema.  Neurological: She is alert and oriented to person, place, and time.  Skin: Skin is warm and dry.  Psychiatric: She has a normal mood and affect. Her behavior is normal.  Nursing note and vitals reviewed.   ED Course  Procedures (including critical care time) Labs Review Labs Reviewed - No data to display  Imaging Review No results found. I have personally reviewed and evaluated these images and lab results as part of my medical decision-making.  EKG Interpretation None      MDM   Final diagnoses:  Adverse reaction to antibiotic, initial encounter    Patient recently seen 3 days prior to arrival and diagnosed with bronchitis. She is returning today because she is having an adverse reaction to the doxycycline. Every time she attempts to take it she vomits. She is here requesting a different anti-ionic. Her symptoms have not worsened and have may be slightly improved. She has an inhaler at home that she is using as needed for shortness of breath. No new chest pain, fevers, worsening swelling or abdominal pain. Patient was changed to a Z-Pak and doxycycline was DC'd.    Blanchie Dessert, MD 06/19/15 1116

## 2015-06-19 NOTE — ED Notes (Signed)
Directed to pharmacy to pick up prescription

## 2015-08-01 DIAGNOSIS — I495 Sick sinus syndrome: Secondary | ICD-10-CM | POA: Insufficient documentation

## 2017-01-05 ENCOUNTER — Encounter (HOSPITAL_BASED_OUTPATIENT_CLINIC_OR_DEPARTMENT_OTHER): Payer: Self-pay | Admitting: Emergency Medicine

## 2017-01-05 ENCOUNTER — Emergency Department (HOSPITAL_BASED_OUTPATIENT_CLINIC_OR_DEPARTMENT_OTHER): Payer: Medicare Other

## 2017-01-05 ENCOUNTER — Emergency Department (HOSPITAL_BASED_OUTPATIENT_CLINIC_OR_DEPARTMENT_OTHER)
Admission: EM | Admit: 2017-01-05 | Discharge: 2017-01-06 | Disposition: A | Discharge status: Home / Self Care | Payer: Medicare Other | Attending: Emergency Medicine | Admitting: Emergency Medicine

## 2017-01-05 DIAGNOSIS — I509 Heart failure, unspecified: Secondary | ICD-10-CM | POA: Insufficient documentation

## 2017-01-05 DIAGNOSIS — I110 Hypertensive heart disease with heart failure: Secondary | ICD-10-CM | POA: Diagnosis not present

## 2017-01-05 DIAGNOSIS — Z79899 Other long term (current) drug therapy: Secondary | ICD-10-CM | POA: Insufficient documentation

## 2017-01-05 DIAGNOSIS — Z950 Presence of cardiac pacemaker: Secondary | ICD-10-CM | POA: Insufficient documentation

## 2017-01-05 DIAGNOSIS — R51 Headache: Secondary | ICD-10-CM | POA: Insufficient documentation

## 2017-01-05 DIAGNOSIS — I2510 Atherosclerotic heart disease of native coronary artery without angina pectoris: Secondary | ICD-10-CM | POA: Diagnosis not present

## 2017-01-05 DIAGNOSIS — R519 Headache, unspecified: Secondary | ICD-10-CM

## 2017-01-05 DIAGNOSIS — Z7982 Long term (current) use of aspirin: Secondary | ICD-10-CM | POA: Diagnosis not present

## 2017-01-05 LAB — CBC WITH DIFFERENTIAL/PLATELET
Basophils Absolute: 0 10*3/uL (ref 0.0–0.1)
Basophils Relative: 1 %
EOS ABS: 0.3 10*3/uL (ref 0.0–0.7)
EOS PCT: 4 %
HCT: 42.1 % (ref 36.0–46.0)
Hemoglobin: 13.6 g/dL (ref 12.0–15.0)
LYMPHS ABS: 1.6 10*3/uL (ref 0.7–4.0)
Lymphocytes Relative: 27 %
MCH: 31.1 pg (ref 26.0–34.0)
MCHC: 32.3 g/dL (ref 30.0–36.0)
MCV: 96.1 fL (ref 78.0–100.0)
MONOS PCT: 10 %
Monocytes Absolute: 0.6 10*3/uL (ref 0.1–1.0)
Neutro Abs: 3.6 10*3/uL (ref 1.7–7.7)
Neutrophils Relative %: 58 %
PLATELETS: 200 10*3/uL (ref 150–400)
RBC: 4.38 MIL/uL (ref 3.87–5.11)
RDW: 15.7 % — ABNORMAL HIGH (ref 11.5–15.5)
WBC: 6.1 10*3/uL (ref 4.0–10.5)

## 2017-01-05 LAB — BASIC METABOLIC PANEL
Anion gap: 6 (ref 5–15)
BUN: 15 mg/dL (ref 6–20)
CALCIUM: 8.9 mg/dL (ref 8.9–10.3)
CO2: 29 mmol/L (ref 22–32)
Chloride: 106 mmol/L (ref 101–111)
Creatinine, Ser: 1.23 mg/dL — ABNORMAL HIGH (ref 0.44–1.00)
GFR calc Af Amer: 49 mL/min — ABNORMAL LOW (ref >60)
GFR, EST NON AFRICAN AMERICAN: 42 mL/min — AB (ref >60)
Glucose, Bld: 100 mg/dL — ABNORMAL HIGH (ref 65–99)
Potassium: 3.7 mmol/L (ref 3.5–5.1)
SODIUM: 141 mmol/L (ref 135–145)

## 2017-01-05 NOTE — ED Triage Notes (Addendum)
Headache since this morning.  Took Ibuprofen 2-3 hrs ago with no improvement. Pt reports generalized head pain but worse on right, radiating down to right side of neck.

## 2017-01-05 NOTE — ED Provider Notes (Signed)
[facility] DEPT [facility] Provider Note: Georgena Spurling, MD, FACEP  CSN: [id number] MRN: [id number] ARRIVAL: 01/05/17 at [place]: [id number]  By signing my name below, I, Gwenlyn Fudge, attest that this documentation has been prepared under the direction and in the presence of Shanon Rosser, MD. Electronically Signed: Gwenlyn Fudge, ED Scribe. 01/05/17. 11:29 PM.   CHIEF COMPLAINT  Headache   HISTORY OF PRESENT ILLNESS  [name] is a 76 y.o. female with PMHx of CHF, MI, HTN, HLD and TIA who presents to the Emergency Department complaining of sudden onset, gradually worsening, dull, right sided headache beginning this morning. Her headache is not made worse when rotating her head from side to side but is worse with palpation; the pain radiates to the right side of her neck. She rates her pain as a 9 out of 10. She took Ibuprofen 2-3 hours PTA with no improvement. Pt reports associated chills. She denies blurred vision, nausea, vomiting, fever, chest pain and shortness of breath. She denies new numbness or weakness. She has some intermittent numbness in the ulnar distribution of the right forearm but this is not new. She also has a history of CVA with mild residual RUE weakness.   PCP: Dr. Laurin Coder of [facility].  Past Medical History:  Diagnosis Date  . Arthritis   . CHF (congestive heart failure) ([place])    05/2011 echo severe concentric LVH, normal systolic fxn, grade 1 diastolic dysfxn, paradoxical septal motion  . Coronary artery disease    Cath 04/2006 normal coronaries, mild-mod slow flow in all coronaries with slow distall runoff, mild-mod LV enlargement, EF 55%   . Depression   . Dysrhythmia   . GERD (gastroesophageal reflux disease)   . Hypercholesterolemia   . Hyperlipemia   . Hypertension   . MI (myocardial infarction)    NOT SURE HOW MANY  . Morbid obesity ([place])   . Nocturia   . 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 Peripheral vascular disease ([place])    . Pneumonia NONE RECENT  . Sleep apnea    needs cpap but does not wear  . Stroke ([place])   . 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 Vertigo    takes meclizine daily    Past Surgical History:  Procedure Laterality Date  . ABDOMINAL HYSTERECTOMY    . CARDIAC CATHETERIZATION    . CATARACT EXTRACTION W/PHACO Left 08/29/2013   Procedure: CATARACT EXTRACTION PHACO AND INTRAOCULAR LENS PLACEMENT (IOC);  Surgeon: Adonis Brook, MD;  Location: [facility];  Service: Ophthalmology;  Laterality: Left;  . COLONOSCOPY WITH PROPOFOL N/A 01/07/2014   Procedure: COLONOSCOPY WITH PROPOFOL;  Surgeon: Juanita Craver, MD;  Location: [facility];  Service: Endoscopy;  Laterality: N/A;  . DILATION AND CURETTAGE OF UTERUS    . INSERT / REPLACE / REMOVE PACEMAKER    . PACEMAKER GENERATOR CHANGE N/A 03/27/2014   Procedure: PACEMAKER GENERATOR CHANGE;  Surgeon: Sanda Klein, MD;  Location: [facility] CATH LAB;  Service: Cardiovascular;  Laterality: N/A;  . PACEMAKER INSERTION    . PACEMAKER INSERTION    . TUBAL LIGATION    . VASCULAR SURGERY     vericose vein surgery    No family history on file.  Social History  Substance Use Topics  . Smoking status: Never Smoker  . Smokeless tobacco: Never Used  . Alcohol use No    Prior to Admission medications   Medication  Sig Start Date End Date Taking? Authorizing Provider  allopurinol (ZYLOPRIM) 100 MG tablet Take 100 mg by mouth daily.   Yes Historical Provider, MD  aspirin 81 MG chewable tablet Chew 1 tablet (81 mg total) by mouth daily. 10/19/12  Yes Ripudeep Krystal Eaton, MD  diltiazem (CARDIZEM CD) 360 MG 24 hr capsule Take 360 mg by mouth every morning.    Yes Historical Provider, MD  furosemide (LASIX) 40 MG tablet Take 40 mg by mouth every morning.   Yes Historical Provider, MD  hydrALAZINE (APRESOLINE) 25 MG tablet Take 50 mg by mouth 3 (three) times daily.    Yes Historical Provider, MD  isosorbide mononitrate  (IMDUR) 60 MG 24 hr tablet Take 60 mg by mouth every morning.    Yes Historical Provider, MD  losartan (COZAAR) 100 MG tablet Take 100 mg by mouth daily.   Yes Historical Provider, MD  metoCLOPramide (REGLAN) 10 MG tablet Take 10 mg by mouth every 6 (six) hours as needed for nausea.    Yes Historical Provider, MD  omeprazole (PRILOSEC) 40 MG capsule Take 40 mg by mouth daily.   Yes Historical Provider, MD  oxybutynin (DITROPAN) 5 MG tablet Take 5 mg by mouth 2 (two) times daily.   Yes Historical Provider, MD  pantoprazole (PROTONIX) 40 MG tablet Take 40 mg by mouth daily.    Yes Historical Provider, MD  potassium chloride SA (K-DUR,KLOR-CON) 20 MEQ tablet Take 20 mEq by mouth daily.    Yes Historical Provider, MD  rosuvastatin (CRESTOR) 10 MG tablet Take 10 mg by mouth every evening.    Yes Historical Provider, MD  HYDROcodone-acetaminophen (NORCO/VICODIN) 5-325 MG tablet Take 1 tablet by mouth every 6 (six) hours as needed (for pain; may cause constipation). For pain 01/06/17   Shanon Rosser, MD  meclizine (ANTIVERT) 25 MG tablet Take 12.5-25 mg by mouth 3 (three) times daily as needed. For dizziness    Historical Provider, MD  nitroGLYCERIN (NITROSTAT) 0.4 MG SL tablet Place 0.4 mg under the tongue every 5 (five) minutes as needed for chest pain.     Historical Provider, MD    Allergies Ampicillin   REVIEW OF SYSTEMS  Negative except as noted here or in the History of Present Illness.   PHYSICAL EXAMINATION  Initial Vital Signs Blood pressure 174/99, pulse (!) 59, temperature 98.8 F (37.1 C), temperature source Oral, resp. rate 18, height 5\' 6"  (1.676 m), weight (!) 349 lb (158.3 kg), SpO2 97 %.  Examination General: Well-developed, well-nourished female in no acute distress; appearance consistent with age of record HENT: normocephalic; atraumatic; tenderness and hyperesthesia of right side of head and neck Eyes: pupils equal, round and reactive to light; extraocular muscles  intact Neck: supple Heart: regular rate and rhythm;  Lungs: clear to auscultation bilaterally Abdomen: soft; nondistended; nontender; no masses or hepatosplenomegaly; bowel sounds present Extremities: No deformity; full range of motion; 3+ pitting edema of the lower leg with chronic appearing stasis changes Neurologic: Awake, alert and oriented; motor function intact in all extremities with strength +4/5 in the RUE and LLE; no facial droop, sensation intact and symmetric bilaterally; negative Romberg; normal finger-to-nose Skin: Warm and dry Psychiatric: Normal mood and affect   RESULTS  Summary of this visit's results, reviewed by myself:   EKG Interpretation  Date/Time:    Ventricular Rate:    PR Interval:    QRS Duration:   QT Interval:    QTC Calculation:   R Axis:     Text  Interpretation:        Laboratory Studies: Results for orders placed or performed during the hospital encounter of 01/05/17 (from the past 24 hour(s))  CBC with Differential/Platelet     Status: Abnormal   Collection Time: 01/05/17 11:31 PM  Result Value Ref Range   WBC 6.1 4.0 - 10.5 K/uL   RBC 4.38 3.87 - 5.11 MIL/uL   Hemoglobin 13.6 12.0 - 15.0 g/dL   HCT 42.1 36.0 - 46.0 %   MCV 96.1 78.0 - 100.0 fL   MCH 31.1 26.0 - 34.0 pg   MCHC 32.3 30.0 - 36.0 g/dL   RDW 15.7 (H) 11.5 - 15.5 %   Platelets 200 150 - 400 K/uL   Neutrophils Relative % 58 %   Neutro Abs 3.6 1.7 - 7.7 K/uL   Lymphocytes Relative 27 %   Lymphs Abs 1.6 0.7 - 4.0 K/uL   Monocytes Relative 10 %   Monocytes Absolute 0.6 0.1 - 1.0 K/uL   Eosinophils Relative 4 %   Eosinophils Absolute 0.3 0.0 - 0.7 K/uL   Basophils Relative 1 %   Basophils Absolute 0.0 0.0 - 0.1 K/uL  Basic metabolic panel     Status: Abnormal   Collection Time: 01/05/17 11:31 PM  Result Value Ref Range   Sodium 141 135 - 145 mmol/L   Potassium 3.7 3.5 - 5.1 mmol/L   Chloride 106 101 - 111 mmol/L   CO2 29 22 - 32 mmol/L   Glucose, Bld 100 (H) 65 - 99  mg/dL   BUN 15 6 - 20 mg/dL   Creatinine, Ser 1.23 (H) 0.44 - 1.00 mg/dL   Calcium 8.9 8.9 - 10.3 mg/dL   GFR calc non Af Amer 42 (L) >60 mL/min   GFR calc Af Amer 49 (L) >60 mL/min   Anion gap 6 5 - 15   Imaging Studies: Ct Head Wo Contrast  Result Date: 01/06/2017 CLINICAL DATA:  Headaches for 12 hours. EXAM: CT HEAD WITHOUT CONTRAST TECHNIQUE: Contiguous axial images were obtained from the base of the skull through the vertex without intravenous contrast. COMPARISON:  02/07/2013 FINDINGS: Brain: No evidence of acute infarction, hemorrhage, hydrocephalus, extra-axial collection or mass lesion/mass effect. Patchy low-attenuation changes in the deep white matter likely to represent small vessel ischemia. Vascular: No hyperdense vessel or unexpected calcification. Skull: Calvarium appears intact. Sinuses/Orbits: Paranasal sinuses and mastoid air cells are not opacified. Other: No significant changes since prior study. IMPRESSION: No acute intracranial abnormalities. Chronic white matter changes likely representing small vessel ischemia. Electronically Signed   By: Lucienne Capers M.D.   On: 01/06/2017 00:13    ED COURSE  Nursing notes and initial vitals signs, including pulse oximetry, reviewed.  Vitals:   01/05/17 2306 01/05/17 2307  BP: 174/99   Pulse: (!) 59   Resp: 18   Temp: 98.8 F (37.1 C)   TempSrc: Oral   SpO2: 97%   Weight:  (!) 349 lb (158.3 kg)  Height:  5\' 6"  (1.676 m)   12:24 AM The patient's pain is more consistent with a neuropathy and is not consistent with an acute stroke. There is no evidence of acute intracranial hemorrhage. We will treat her pain and have her follow-up with her PCP.  PROCEDURES    ED DIAGNOSES     ICD-9-CM ICD-10-CM   1. Right sided facial pain 784.0 R51     I personally performed the services described in this documentation, which was scribed in my presence. The recorded information  has been reviewed and is accurate.     Shanon Rosser,  MD 01/06/17 [phone]

## 2017-01-05 NOTE — ED Notes (Signed)
ED Provider at bedside. 

## 2017-01-06 MED ORDER — HYDROCODONE-ACETAMINOPHEN 5-325 MG PO TABS
1.0000 | ORAL_TABLET | Freq: Once | ORAL | Status: AC
  Administered 2017-01-06: 1 via ORAL
  Filled 2017-01-06: qty 1

## 2017-01-06 MED ORDER — HYDROCODONE-ACETAMINOPHEN 5-325 MG PO TABS
1.0000 | ORAL_TABLET | Freq: Four times a day (QID) | ORAL | 0 refills | Status: DC | PRN
Start: 2017-01-06 — End: 2019-12-01

## 2019-06-18 ENCOUNTER — Inpatient Hospital Stay (HOSPITAL_COMMUNITY): Payer: Medicare Other

## 2019-06-18 ENCOUNTER — Inpatient Hospital Stay (HOSPITAL_COMMUNITY)
Admission: AD | Admit: 2019-06-18 | Discharge: 2019-06-20 | DRG: 291 | Disposition: A | Discharge status: Home Health | Payer: Medicare Other | Source: Ambulatory Visit | Attending: Cardiovascular Disease | Admitting: Cardiovascular Disease

## 2019-06-18 DIAGNOSIS — E785 Hyperlipidemia, unspecified: Secondary | ICD-10-CM | POA: Diagnosis present

## 2019-06-18 DIAGNOSIS — Z950 Presence of cardiac pacemaker: Secondary | ICD-10-CM | POA: Diagnosis not present

## 2019-06-18 DIAGNOSIS — N183 Chronic kidney disease, stage 3 (moderate): Secondary | ICD-10-CM | POA: Diagnosis present

## 2019-06-18 DIAGNOSIS — G4730 Sleep apnea, unspecified: Secondary | ICD-10-CM | POA: Diagnosis present

## 2019-06-18 DIAGNOSIS — M1990 Unspecified osteoarthritis, unspecified site: Secondary | ICD-10-CM | POA: Diagnosis present

## 2019-06-18 DIAGNOSIS — E7800 Pure hypercholesterolemia, unspecified: Secondary | ICD-10-CM | POA: Diagnosis present

## 2019-06-18 DIAGNOSIS — Z7982 Long term (current) use of aspirin: Secondary | ICD-10-CM | POA: Diagnosis not present

## 2019-06-18 DIAGNOSIS — K219 Gastro-esophageal reflux disease without esophagitis: Secondary | ICD-10-CM | POA: Diagnosis present

## 2019-06-18 DIAGNOSIS — I509 Heart failure, unspecified: Secondary | ICD-10-CM

## 2019-06-18 DIAGNOSIS — I130 Hypertensive heart and chronic kidney disease with heart failure and stage 1 through stage 4 chronic kidney disease, or unspecified chronic kidney disease: Secondary | ICD-10-CM | POA: Diagnosis present

## 2019-06-18 DIAGNOSIS — I252 Old myocardial infarction: Secondary | ICD-10-CM

## 2019-06-18 DIAGNOSIS — R0602 Shortness of breath: Secondary | ICD-10-CM | POA: Diagnosis present

## 2019-06-18 DIAGNOSIS — Z9842 Cataract extraction status, left eye: Secondary | ICD-10-CM

## 2019-06-18 DIAGNOSIS — Z888 Allergy status to other drugs, medicaments and biological substances status: Secondary | ICD-10-CM | POA: Diagnosis not present

## 2019-06-18 DIAGNOSIS — I739 Peripheral vascular disease, unspecified: Secondary | ICD-10-CM | POA: Diagnosis present

## 2019-06-18 DIAGNOSIS — I5041 Acute combined systolic (congestive) and diastolic (congestive) heart failure: Secondary | ICD-10-CM | POA: Diagnosis present

## 2019-06-18 DIAGNOSIS — I2510 Atherosclerotic heart disease of native coronary artery without angina pectoris: Secondary | ICD-10-CM | POA: Diagnosis present

## 2019-06-18 DIAGNOSIS — F329 Major depressive disorder, single episode, unspecified: Secondary | ICD-10-CM | POA: Diagnosis present

## 2019-06-18 DIAGNOSIS — Z79899 Other long term (current) drug therapy: Secondary | ICD-10-CM | POA: Diagnosis not present

## 2019-06-18 DIAGNOSIS — I5021 Acute systolic (congestive) heart failure: Secondary | ICD-10-CM | POA: Diagnosis present

## 2019-06-18 DIAGNOSIS — Z20828 Contact with and (suspected) exposure to other viral communicable diseases: Secondary | ICD-10-CM | POA: Diagnosis present

## 2019-06-18 DIAGNOSIS — R002 Palpitations: Secondary | ICD-10-CM | POA: Diagnosis present

## 2019-06-18 DIAGNOSIS — I10 Essential (primary) hypertension: Secondary | ICD-10-CM | POA: Diagnosis present

## 2019-06-18 DIAGNOSIS — Z961 Presence of intraocular lens: Secondary | ICD-10-CM | POA: Diagnosis present

## 2019-06-18 DIAGNOSIS — Z79891 Long term (current) use of opiate analgesic: Secondary | ICD-10-CM | POA: Diagnosis not present

## 2019-06-18 DIAGNOSIS — Z8673 Personal history of transient ischemic attack (TIA), and cerebral infarction without residual deficits: Secondary | ICD-10-CM | POA: Diagnosis not present

## 2019-06-18 DIAGNOSIS — Z9841 Cataract extraction status, right eye: Secondary | ICD-10-CM | POA: Diagnosis not present

## 2019-06-18 LAB — COMPREHENSIVE METABOLIC PANEL
ALT: 10 U/L (ref 0–44)
AST: 16 U/L (ref 15–41)
Albumin: 3.6 g/dL (ref 3.5–5.0)
Alkaline Phosphatase: 84 U/L (ref 38–126)
Anion gap: 12 (ref 5–15)
BUN: 20 mg/dL (ref 8–23)
CO2: 21 mmol/L — ABNORMAL LOW (ref 22–32)
Calcium: 8.9 mg/dL (ref 8.9–10.3)
Chloride: 109 mmol/L (ref 98–111)
Creatinine, Ser: 1.81 mg/dL — ABNORMAL HIGH (ref 0.44–1.00)
GFR calc Af Amer: 31 mL/min — ABNORMAL LOW (ref >60)
GFR calc non Af Amer: 27 mL/min — ABNORMAL LOW (ref >60)
Glucose, Bld: 112 mg/dL — ABNORMAL HIGH (ref 70–99)
Potassium: 3.8 mmol/L (ref 3.5–5.1)
Sodium: 142 mmol/L (ref 135–145)
Total Bilirubin: 0.9 mg/dL (ref 0.3–1.2)
Total Protein: 7.2 g/dL (ref 6.5–8.1)

## 2019-06-18 LAB — CBC WITH DIFFERENTIAL/PLATELET
Abs Immature Granulocytes: 0.02 10*3/uL (ref 0.00–0.07)
Basophils Absolute: 0.1 10*3/uL (ref 0.0–0.1)
Basophils Relative: 1 %
Eosinophils Absolute: 0.3 10*3/uL (ref 0.0–0.5)
Eosinophils Relative: 7 %
HCT: 42.3 % (ref 36.0–46.0)
Hemoglobin: 13.5 g/dL (ref 12.0–15.0)
Immature Granulocytes: 1 %
Lymphocytes Relative: 27 %
Lymphs Abs: 1.1 10*3/uL (ref 0.7–4.0)
MCH: 31.1 pg (ref 26.0–34.0)
MCHC: 31.9 g/dL (ref 30.0–36.0)
MCV: 97.5 fL (ref 80.0–100.0)
Monocytes Absolute: 0.4 10*3/uL (ref 0.1–1.0)
Monocytes Relative: 10 %
Neutro Abs: 2.3 10*3/uL (ref 1.7–7.7)
Neutrophils Relative %: 54 %
Platelets: 214 10*3/uL (ref 150–400)
RBC: 4.34 MIL/uL (ref 3.87–5.11)
RDW: 15.2 % (ref 11.5–15.5)
WBC: 4.2 10*3/uL (ref 4.0–10.5)
nRBC: 0 % (ref 0.0–0.2)

## 2019-06-18 LAB — TSH: TSH: 2.486 u[IU]/mL (ref 0.350–4.500)

## 2019-06-18 LAB — BRAIN NATRIURETIC PEPTIDE: B Natriuretic Peptide: 63.5 pg/mL (ref 0.0–100.0)

## 2019-06-18 MED ORDER — SODIUM CHLORIDE 0.9 % IV SOLN: 250.0000 mL | INTRAVENOUS | Status: DC | PRN

## 2019-06-18 MED ORDER — DILTIAZEM HCL ER COATED BEADS 180 MG PO CP24
360.0000 mg | ORAL_CAPSULE | Freq: Every morning | ORAL | Status: DC
  Administered 2019-06-19 – 2019-06-20 (×2): 360 mg via ORAL
  Filled 2019-06-18 (×3): qty 2

## 2019-06-18 MED ORDER — SODIUM CHLORIDE 0.9% FLUSH
3.0000 mL | Freq: Two times a day (BID) | INTRAVENOUS | Status: DC
  Administered 2019-06-18 – 2019-06-20 (×4): 3 mL via INTRAVENOUS

## 2019-06-18 MED ORDER — PANTOPRAZOLE SODIUM 40 MG PO TBEC
40.0000 mg | DELAYED_RELEASE_TABLET | Freq: Every day | ORAL | Status: DC
  Administered 2019-06-18 – 2019-06-20 (×3): 40 mg via ORAL
  Filled 2019-06-18 (×3): qty 1

## 2019-06-18 MED ORDER — ACETAMINOPHEN 325 MG PO TABS: 650.0000 mg | ORAL_TABLET | ORAL | Status: DC | PRN

## 2019-06-18 MED ORDER — OXYBUTYNIN CHLORIDE 5 MG PO TABS
5.0000 mg | ORAL_TABLET | Freq: Two times a day (BID) | ORAL | Status: DC
  Administered 2019-06-18 – 2019-06-20 (×4): 5 mg via ORAL
  Filled 2019-06-18 (×4): qty 1

## 2019-06-18 MED ORDER — HYDROCODONE-ACETAMINOPHEN 5-325 MG PO TABS
1.0000 | ORAL_TABLET | Freq: Four times a day (QID) | ORAL | Status: DC | PRN
  Administered 2019-06-19 (×2): 1 via ORAL
  Filled 2019-06-18 (×2): qty 1

## 2019-06-18 MED ORDER — ALLOPURINOL 100 MG PO TABS
100.0000 mg | ORAL_TABLET | Freq: Every day | ORAL | Status: DC
  Administered 2019-06-18 – 2019-06-20 (×3): 100 mg via ORAL
  Filled 2019-06-18 (×3): qty 1

## 2019-06-18 MED ORDER — FUROSEMIDE 10 MG/ML IJ SOLN
40.0000 mg | Freq: Two times a day (BID) | INTRAMUSCULAR | Status: DC
  Administered 2019-06-18 – 2019-06-19 (×3): 40 mg via INTRAVENOUS
  Filled 2019-06-18 (×4): qty 4

## 2019-06-18 MED ORDER — POTASSIUM CHLORIDE CRYS ER 20 MEQ PO TBCR
20.0000 meq | EXTENDED_RELEASE_TABLET | Freq: Every day | ORAL | Status: DC
  Administered 2019-06-18: 20 meq via ORAL
  Filled 2019-06-18: qty 1

## 2019-06-18 MED ORDER — HEPARIN SODIUM (PORCINE) 5000 UNIT/ML IJ SOLN: 5000.0000 [IU] | Freq: Three times a day (TID) | INTRAMUSCULAR | Status: DC

## 2019-06-18 MED ORDER — ISOSORBIDE MONONITRATE ER 60 MG PO TB24
60.0000 mg | ORAL_TABLET | Freq: Every morning | ORAL | Status: DC
  Administered 2019-06-19 – 2019-06-20 (×2): 60 mg via ORAL
  Filled 2019-06-18 (×2): qty 1

## 2019-06-18 MED ORDER — ONDANSETRON HCL 4 MG/2ML IJ SOLN: 4.0000 mg | Freq: Four times a day (QID) | INTRAMUSCULAR | Status: DC | PRN

## 2019-06-18 MED ORDER — APIXABAN 5 MG PO TABS
5.0000 mg | ORAL_TABLET | Freq: Two times a day (BID) | ORAL | Status: DC
  Administered 2019-06-18 – 2019-06-20 (×4): 5 mg via ORAL
  Filled 2019-06-18 (×4): qty 1

## 2019-06-18 MED ORDER — LOSARTAN POTASSIUM 50 MG PO TABS
100.0000 mg | ORAL_TABLET | Freq: Every day | ORAL | Status: DC
  Administered 2019-06-18 – 2019-06-20 (×3): 100 mg via ORAL
  Filled 2019-06-18 (×3): qty 2

## 2019-06-18 MED ORDER — ASPIRIN 81 MG PO CHEW
81.0000 mg | CHEWABLE_TABLET | Freq: Every day | ORAL | Status: DC
  Administered 2019-06-18 – 2019-06-20 (×3): 81 mg via ORAL
  Filled 2019-06-18 (×3): qty 1

## 2019-06-18 MED ORDER — ROSUVASTATIN CALCIUM 5 MG PO TABS
10.0000 mg | ORAL_TABLET | Freq: Every evening | ORAL | Status: DC
  Administered 2019-06-18 – 2019-06-19 (×2): 10 mg via ORAL
  Filled 2019-06-18 (×3): qty 2

## 2019-06-18 MED ORDER — SODIUM CHLORIDE 0.9% FLUSH: 3.0000 mL | INTRAVENOUS | Status: DC | PRN

## 2019-06-18 MED ORDER — HYDRALAZINE HCL 25 MG PO TABS
25.0000 mg | ORAL_TABLET | Freq: Two times a day (BID) | ORAL | Status: DC
  Administered 2019-06-19 – 2019-06-20 (×3): 25 mg via ORAL
  Filled 2019-06-18 (×4): qty 1

## 2019-06-18 NOTE — H&P (Signed)
Referring Physician: Shayona Lavoie is an 78 y.o. female.                       Chief Complaint: Shortness of breath and Palpitation  HPI: 78 years old female with CAD, HTN, Hyperlipidemia, morbid obesity, permanent pacemaker and depression has 30 pounds of weight gain in 3-6 months with increasing shortness of breath. She denies fever and cough. She has noticed palpitations off and on also.  Past Medical History:  Diagnosis Date  . Arthritis   . CHF (congestive heart failure) ([place])    05/2011 echo severe concentric LVH, normal systolic fxn, grade 1 diastolic dysfxn, paradoxical septal motion  . Coronary artery disease    Cath 04/2006 normal coronaries, mild-mod slow flow in all coronaries with slow distall runoff, mild-mod LV enlargement, EF 55%   . Depression   . Dysrhythmia   . GERD (gastroesophageal reflux disease)   . Hypercholesterolemia   . Hyperlipemia   . Hypertension   . MI (myocardial infarction)    NOT SURE HOW MANY  . Morbid obesity ([place])   . Nocturia   . 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 Peripheral vascular disease ([place])   . Pneumonia NONE RECENT  . Sleep apnea    needs cpap but does not wear  . Stroke ([place])   . 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 Vertigo    takes meclizine daily      Past Surgical History:  Procedure Laterality Date  . ABDOMINAL HYSTERECTOMY    . CARDIAC CATHETERIZATION    . CATARACT EXTRACTION W/PHACO Left 08/29/2013   Procedure: CATARACT EXTRACTION PHACO AND INTRAOCULAR LENS PLACEMENT (IOC);  Surgeon: Adonis Brook, MD;  Location: [facility];  Service: Ophthalmology;  Laterality: Left;  . COLONOSCOPY WITH PROPOFOL N/A 01/07/2014   Procedure: COLONOSCOPY WITH PROPOFOL;  Surgeon: Juanita Craver, MD;  [facility];  Service: Endoscopy;  Laterality: N/A;  . DILATION AND CURETTAGE OF UTERUS    . INSERT / REPLACE / REMOVE PACEMAKER    . PACEMAKER  GENERATOR CHANGE N/A 03/27/2014   Procedure: PACEMAKER GENERATOR CHANGE;  Surgeon: Sanda Klein, MD;  Location: [facility] CATH LAB;  Service: Cardiovascular;  Laterality: N/A;  . PACEMAKER INSERTION    . PACEMAKER INSERTION    . TUBAL LIGATION    . VASCULAR SURGERY     vericose vein surgery    No family history on file. Social History:  reports that she has never smoked. She has never used smokeless tobacco. She reports that she does not drink alcohol or use drugs.  Allergies:  Allergies  Allergen Reactions  . Ampicillin Other (See Comments)    Increases heart rhythm     Medications Prior to Admission  Medication Sig Dispense Refill  . allopurinol (ZYLOPRIM) 100 MG tablet Take 100 mg by mouth daily.    [name] Kitchen aspirin 81 MG chewable tablet Chew 1 tablet (81 mg total) by mouth daily.    [name] Kitchen diltiazem (CARDIZEM CD) 360 MG 24 hr capsule Take 360 mg by mouth every morning.     . furosemide (LASIX) 40 MG tablet Take 40 mg by mouth every morning.    . hydrALAZINE (APRESOLINE) 25 MG tablet Take 50 mg by mouth 3 (three) times daily.     [name] Kitchen HYDROcodone-acetaminophen (NORCO/VICODIN) 5-325 MG tablet Take 1 tablet by mouth every 6 (six) hours as needed (  for pain; may cause constipation). For pain 12 tablet 0  . isosorbide mononitrate (IMDUR) 60 MG 24 hr tablet Take 60 mg by mouth every morning.     [name] Kitchen losartan (COZAAR) 100 MG tablet Take 100 mg by mouth daily.    . meclizine (ANTIVERT) 25 MG tablet Take 12.5-25 mg by mouth 3 (three) times daily as needed. For dizziness    . metoCLOPramide (REGLAN) 10 MG tablet Take 10 mg by mouth every 6 (six) hours as needed for nausea.     . nitroGLYCERIN (NITROSTAT) 0.4 MG SL tablet Place 0.4 mg under the tongue every 5 (five) minutes as needed for chest pain.     [name] Kitchen omeprazole (PRILOSEC) 40 MG capsule Take 40 mg by mouth daily.    [name] Kitchen oxybutynin (DITROPAN) 5 MG tablet Take 5 mg by mouth 2 (two) times daily.    . pantoprazole (PROTONIX) 40 MG tablet Take 40 mg by mouth daily.      . potassium chloride SA (K-DUR,KLOR-CON) 20 MEQ tablet Take 20 mEq by mouth daily.     . rosuvastatin (CRESTOR) 10 MG tablet Take 10 mg by mouth every evening.       No results found for this or any previous visit (from the past 48 hour(s)). No results found.  Review Of Systems Constitutional: No fever, chills, weight loss or gain. Eyes: No vision change, wears glasses. No discharge or pain. Ears: No hearing loss, No tinnitus. Respiratory: No asthma, COPD, pneumonias. Positive shortness of breath. No hemoptysis. Cardiovascular: Positive chest pain, palpitation, leg edema. Gastrointestinal: No nausea, vomiting, diarrhea, constipation. No GI bleed. No hepatitis. Genitourinary: No dysuria, hematuria, kidney stone. No incontinance. Neurological: No headache, positive stroke, no seizures.  Psychiatry: No psych facility admission for anxiety, depression, suicide. No detox. Skin: No rash. Musculoskeletal: Positive joint pain, fibromyalgia, neck pain, back pain. Lymphadenopathy: No lymphadenopathy. Hematology: No anemia or easy bruising.   Blood pressure 110/68, pulse 63, temperature 99.4 F (37.4 C), temperature source Oral, resp. rate 18, SpO2 95 %. There is no height or weight on file to calculate BMI. General appearance: alert, cooperative, appears stated age and moderate respiratory distress Head: Normocephalic, atraumatic. Eyes: Brown eyes, pink conjunctiva, corneas clear. PERRL, EOM's intact. Neck: No adenopathy, no carotid bruit, + JVD, supple, symmetrical, trachea midline and thyroid not enlarged. Resp: Basal crackles to auscultation bilaterally. Cardio: Regular rate and rhythm, S1, S2 normal, II/VI systolic murmur, no click, rub or gallop GI: Soft, non-tender; bowel sounds normal; no organomegaly. Extremities: 4 + edema, cyanosis or clubbing. Skin: Warm and dry.  Neurologic: Alert and oriented X 3, normal strength. Normal coordination and very slow  gait.  Assessment/Plan Acute left systolic failure CAD HTN Morbid obesity Depression S/P Permanent pacemaker Palpitations  Admit IV lasix Blood work. Echocardiogram  Time spent: Review of old records, Lab, x-rays, EKG, other cardiac tests, examination, discussion with patient over 70 minutes.  Birdie Riddle, MD  06/18/2019, 5:52 PM

## 2019-06-19 ENCOUNTER — Inpatient Hospital Stay (HOSPITAL_COMMUNITY): Payer: Medicare Other

## 2019-06-19 ENCOUNTER — Other Ambulatory Visit: Payer: Self-pay

## 2019-06-19 ENCOUNTER — Encounter (HOSPITAL_COMMUNITY): Payer: Self-pay

## 2019-06-19 LAB — SARS CORONAVIRUS 2 (TAT 6-24 HRS): SARS Coronavirus 2: NEGATIVE

## 2019-06-19 LAB — BASIC METABOLIC PANEL
Anion gap: 7 (ref 5–15)
BUN: 23 mg/dL (ref 8–23)
CO2: 23 mmol/L (ref 22–32)
Calcium: 8.4 mg/dL — ABNORMAL LOW (ref 8.9–10.3)
Chloride: 112 mmol/L — ABNORMAL HIGH (ref 98–111)
Creatinine, Ser: 1.57 mg/dL — ABNORMAL HIGH (ref 0.44–1.00)
GFR calc Af Amer: 37 mL/min — ABNORMAL LOW (ref >60)
GFR calc non Af Amer: 32 mL/min — ABNORMAL LOW (ref >60)
Glucose, Bld: 99 mg/dL (ref 70–99)
Potassium: 3.3 mmol/L — ABNORMAL LOW (ref 3.5–5.1)
Sodium: 142 mmol/L (ref 135–145)

## 2019-06-19 LAB — ECHOCARDIOGRAM COMPLETE
Height: 66 in
Weight: 5652.8 oz

## 2019-06-19 MED ORDER — FUROSEMIDE 10 MG/ML IJ SOLN
40.0000 mg | Freq: Two times a day (BID) | INTRAMUSCULAR | Status: DC
  Administered 2019-06-20: 40 mg via INTRAVENOUS
  Filled 2019-06-19: qty 4

## 2019-06-19 MED ORDER — POTASSIUM CHLORIDE CRYS ER 20 MEQ PO TBCR
20.0000 meq | EXTENDED_RELEASE_TABLET | Freq: Three times a day (TID) | ORAL | Status: DC
  Administered 2019-06-19 – 2019-06-20 (×4): 20 meq via ORAL
  Filled 2019-06-19 (×5): qty 1

## 2019-06-19 NOTE — Progress Notes (Signed)
Nutrition Brief Note  Nutrition Education Note RD working remotely.  RD consulted for nutrition education regarding new onset CHF. Unable to reach patient via phone today.  RD to mail "Low Sodium Nutrition Therapy" handout from the [employer] of [employer]. Reviewed patient's dietary recall. Handout provides examples on ways to decrease sodium intake in diet including limiting intake of processed foods and use of salt shaker, encourages fresh fruits and vegetables, as well as whole grain sources of carbohydrates to maximize fiber intake.   Handout explains why it is important for patient to adhere to diet recommendations, and emphasizes the role of fluids, foods to avoid, and importance of weighing self daily.    Body mass index is 57.02 kg/m. Pt meets criteria for morbid obesity based on current BMI.  Current diet order is HH/1500 mL, patient is consuming approximately 100% of meals at this time. Labs and medications reviewed. No further nutrition interventions warranted at this time. RD contact information provided. If additional nutrition issues arise, please re-consult RD.   [name], [place], [place] [phone] After Hours/Weekend Pager: [phone]

## 2019-06-19 NOTE — Progress Notes (Signed)
  Echocardiogram 2D Echocardiogram has been performed.  Burnett Kanaris 06/19/2019, 10:55 AM

## 2019-06-19 NOTE — Progress Notes (Signed)
Ref: System, Pcp Not In   Subjective:  Feeling little better. Shortness of breath with exertion continues. 2 + leg edema continues. Some diuresis with IV lasix.  Echocardiogram with preserved LV systolic function and mild LVH and mild diastolic dysfunction.  Objective:  Vital Signs in the last 24 hours: Temp:  [98.2 F (36.8 C)-98.9 F (37.2 C)] 98.2 F (36.8 C) (08/25 1927) Pulse Rate:  [59-60] 59 (08/25 1927) Cardiac Rhythm: Heart block;Bundle branch block (08/25 0800) Resp:  [18-20] 18 (08/25 1927) BP: (107-129)/(50-77) 127/77 (08/25 1927) SpO2:  [97 %-100 %] 99 % (08/25 1927) Weight:  [160.3 kg] 160.3 kg (08/25 0318)  Physical Exam: BP Readings from Last 1 Encounters:  06/19/19 127/77     Wt Readings from Last 1 Encounters:  06/19/19 (!) 160.3 kg    Weight change:  Body mass index is 57.02 kg/m. HEENT: [place]/AT, Eyes-Brown, PERL, EOMI, Conjunctiva-Pink, Sclera-Non-icteric Neck: No JVD, No bruit, Trachea midline. Lungs:  Clearing, Bilateral. Cardiac:  Regular rhythm, normal S1 and S2, no S3. II/VI systolic murmur. Abdomen:  Soft, non-tender. BS present. Extremities:  2 + edema present. No cyanosis. No clubbing. CNS: AxOx3, Cranial nerves grossly intact, moves all 4 extremities.  Skin: Warm and dry.   Intake/Output from previous day: 08/24 0701 - 08/25 0700 In: 250 [P.O.:250] Out: 1400 [Urine:1400]    Lab Results: BMET    Component Value Date/Time   NA 142 06/19/2019 0619   NA 142 06/18/2019 1812   NA 141 01/05/2017 2331   K 3.3 (L) 06/19/2019 0619   K 3.8 06/18/2019 1812   K 3.7 01/05/2017 2331   CL 112 (H) 06/19/2019 0619   CL 109 06/18/2019 1812   CL 106 01/05/2017 2331   CO2 23 06/19/2019 0619   CO2 21 (L) 06/18/2019 1812   CO2 29 01/05/2017 2331   GLUCOSE 99 06/19/2019 0619   GLUCOSE 112 (H) 06/18/2019 1812   GLUCOSE 100 (H) 01/05/2017 2331   BUN 23 06/19/2019 0619   BUN 20 06/18/2019 1812   BUN 15 01/05/2017 2331   CREATININE 1.57 (H) 06/19/2019  0619   CREATININE 1.81 (H) 06/18/2019 1812   CREATININE 1.23 (H) 01/05/2017 2331   CALCIUM 8.4 (L) 06/19/2019 0619   CALCIUM 8.9 06/18/2019 1812   CALCIUM 8.9 01/05/2017 2331   GFRNONAA 32 (L) 06/19/2019 0619   GFRNONAA 27 (L) 06/18/2019 1812   GFRNONAA 42 (L) 01/05/2017 2331   GFRAA 37 (L) 06/19/2019 0619   GFRAA 31 (L) 06/18/2019 1812   GFRAA 49 (L) 01/05/2017 2331   CBC    Component Value Date/Time   WBC 4.2 06/18/2019 1812   RBC 4.34 06/18/2019 1812   HGB 13.5 06/18/2019 1812   HCT 42.3 06/18/2019 1812   PLT 214 06/18/2019 1812   MCV 97.5 06/18/2019 1812   MCH 31.1 06/18/2019 1812   MCHC 31.9 06/18/2019 1812   RDW 15.2 06/18/2019 1812   LYMPHSABS 1.1 06/18/2019 1812   MONOABS 0.4 06/18/2019 1812   EOSABS 0.3 06/18/2019 1812   BASOSABS 0.1 06/18/2019 1812   HEPATIC Function Panel Recent Labs    06/18/19 1812  PROT 7.2   HEMOGLOBIN A1C No components found for: HGA1C,  MPG CARDIAC ENZYMES Lab Results  Component Value Date   CKTOTAL 63 09/10/2011   CKMB 1.7 09/10/2011   TROPONINI <0.03 06/16/2015   TROPONINI <0.30 02/07/2013   TROPONINI <0.30 10/17/2012   BNP No results for input(s): PROBNP in the last 8760 hours. TSH Recent Labs  06/18/19 1812  TSH 2.486   CHOLESTEROL No results for input(s): CHOL in the last 8760 hours.  Scheduled Meds: . allopurinol  100 mg Oral Daily  . apixaban  5 mg Oral BID  . aspirin  81 mg Oral Daily  . diltiazem  360 mg Oral q morning - 10a  . furosemide  40 mg Intravenous Q12H  . hydrALAZINE  25 mg Oral BID  . isosorbide mononitrate  60 mg Oral q morning - 10a  . losartan  100 mg Oral Daily  . oxybutynin  5 mg Oral BID  . pantoprazole  40 mg Oral Daily  . potassium chloride SA  20 mEq Oral TID  . rosuvastatin  10 mg Oral QPM  . sodium chloride flush  3 mL Intravenous Q12H   Continuous Infusions: . sodium chloride     PRN Meds:.sodium chloride, acetaminophen, HYDROcodone-acetaminophen, ondansetron (ZOFRAN) IV,  sodium chloride flush  Assessment/Plan: Acute diastolic left heart failure, HFpEF CAD HTN Morbid obesity Depression S/P permanent pacemaker Palpitations  Increase activity. Home in AM.   LOS: 1 day   Time spent including chart review, lab review, examination, discussion with patient : 30 min   Dixie Dials  MD  06/19/2019, 7:43 PM

## 2019-06-20 LAB — BASIC METABOLIC PANEL
Anion gap: 11 (ref 5–15)
BUN: 25 mg/dL — ABNORMAL HIGH (ref 8–23)
CO2: 22 mmol/L (ref 22–32)
Calcium: 8.7 mg/dL — ABNORMAL LOW (ref 8.9–10.3)
Chloride: 108 mmol/L (ref 98–111)
Creatinine, Ser: 1.61 mg/dL — ABNORMAL HIGH (ref 0.44–1.00)
GFR calc Af Amer: 36 mL/min — ABNORMAL LOW (ref >60)
GFR calc non Af Amer: 31 mL/min — ABNORMAL LOW (ref >60)
Glucose, Bld: 102 mg/dL — ABNORMAL HIGH (ref 70–99)
Potassium: 3.5 mmol/L (ref 3.5–5.1)
Sodium: 141 mmol/L (ref 135–145)

## 2019-06-20 LAB — LIPID PANEL
Cholesterol: 152 mg/dL (ref 0–200)
HDL: 38 mg/dL — ABNORMAL LOW (ref >40)
LDL Cholesterol: 98 mg/dL (ref 0–99)
Total CHOL/HDL Ratio: 4 RATIO
Triglycerides: 82 mg/dL (ref <150)
VLDL: 16 mg/dL (ref 0–40)

## 2019-06-20 LAB — MAGNESIUM: Magnesium: 1.8 mg/dL (ref 1.7–2.4)

## 2019-06-20 NOTE — Discharge Summary (Signed)
Physician Discharge Summary  [name] MRN: [id number] DOB/AGE: 76-09-1941 76 y.o.  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 She also complained of palpitations off and on. Her BNP and Chest x-ray were unremarkable for systolic heart failure and echocardiogram showed preserved LV systolic function with LVH and diastolic dysfunction. She responded well to IV lasix and had about 5 pounds of negative fluid balance in 2 days.  She was discharged home in stable condition with follow up by me in 1 week. Patient understood to adjust her diet and fluid intake by about 50 % reduction, weigh daily and increase activity.  Consults: cardiology  Significant Diagnostic Studies: labs: Normal CBC, BNP, TSH, magnesium and other electrolytes. Elevated Creatinine of 1.81 mg.  EKG: NSR, 1st degree AV block and LBBB.  CXR: Stable.  Echocardiogram: Normal LV systolic function, mild LVH and mild diastolic dysfunction.  Treatments: cardiac meds: diltiazem, furosemide, Eliquis, Aspirin, Isosorbide, hydralazine, SL NTG, Rosuvastatin and losartan.  Discharge Exam: Blood pressure 104/60, pulse (!) 59, temperature 98.1 F (36.7 C), temperature source Oral, resp. rate 18, height 5\' 6"  (1.676 m), weight (!) 160 kg, SpO2 96  %. General appearance: alert, cooperative and appears stated age. Head: Normocephalic, atraumatic. Eyes: Brown eyes, pink conjunctiva, corneas clear. PERRL, EOM's intact.  Neck: No adenopathy, no carotid bruit, no JVD, supple, symmetrical, trachea midline and thyroid not enlarged. Resp: Clear to auscultation bilaterally. Cardio: Regular rate and rhythm, S1, S2 normal, II/VI systolic murmur, no click, rub or gallop. GI: Soft, non-tender; bowel sounds normal; no organomegaly. Extremities: 1-2 + lower leg edema with discoloration from venous stasis, no cyanosis or clubbing. Skin: Warm and dry.  Neurologic: Alert and oriented X 3, normal strength and tone. Normal coordination and very slow gait with walker.  Disposition: Discharge disposition: 01-Home or Self Care        Allergies as of 06/20/2019      Reactions   Ampicillin Other (See Comments)   Increases heart rhythm      Medication List    STOP taking these medications   omeprazole 40 MG capsule Commonly known as: PRILOSEC     TAKE these medications   allopurinol 100 MG tablet Commonly known as: ZYLOPRIM Take 100 mg by mouth daily.   apixaban 5 MG Tabs tablet Commonly known as: ELIQUIS Take 5 mg by mouth 2 (two) times daily.   aspirin 81 MG chewable tablet Chew 1 tablet (81 mg total) by mouth daily.   diltiazem 360 MG 24 hr capsule Commonly known as: CARDIZEM CD Take 360 mg by mouth every morning.   furosemide 40 MG tablet Commonly known as: LASIX Take 40 mg by mouth every morning.   hydrALAZINE 25 MG tablet Commonly known as: APRESOLINE Take 25 mg by mouth 2 (two) times daily.   HYDROcodone-acetaminophen 5-325 MG tablet Commonly known as: NORCO/VICODIN Take 1 tablet  by mouth every 6 (six) hours as needed (for pain; may cause constipation). For pain   isosorbide mononitrate 60 MG 24 hr tablet Commonly known as: IMDUR Take 60 mg by mouth every morning.   losartan 100 MG tablet Commonly known as:  COZAAR Take 100 mg by mouth daily.   meclizine 25 MG tablet Commonly known as: ANTIVERT Take 12.5-25 mg by mouth 3 (three) times daily as needed. For dizziness   metoCLOPramide 10 MG tablet Commonly known as: REGLAN Take 10 mg by mouth every 6 (six) hours as needed for nausea.   nitroGLYCERIN 0.4 MG SL tablet Commonly known as: NITROSTAT Place 0.4 mg under the tongue every 5 (five) minutes as needed for chest pain.   oxybutynin 5 MG tablet Commonly known as: DITROPAN Take 5 mg by mouth 2 (two) times daily.   pantoprazole 40 MG tablet Commonly known as: PROTONIX Take 40 mg by mouth daily.   potassium chloride SA 20 MEQ tablet Commonly known as: K-DUR Take 20 mEq by mouth daily.   rosuvastatin 10 MG tablet Commonly known as: CRESTOR Take 10 mg by mouth every evening.      Follow-up Information    Dixie Dials, MD. Schedule an appointment as soon as possible for a visit in 1 week(s).   Specialty: Cardiology Contact information: [place] [address] [phone]           Time spent: Review of old chart, current chart, lab, x-ray, cardiac tests and discussion with patient over 60 minutes.  Signed: Birdie Riddle 06/20/2019, 9:27 AM

## 2019-06-20 NOTE — Evaluation (Signed)
Physical Therapy Evaluation Patient Details Name: [name] MRN: [id number] DOB: 1941/04/01 Today's Date: 06/20/2019   History of Present Illness  78 years old female with CAD, HTN, Hyperlipidemia, morbid obesity, permanent pacemaker and depression has 30 pounds of weight gain in 3-6 months with increasing shortness of breath. Admitted 06/18/19 for SoB and palpitation. treated for Acute left diastolic heart failure.   Clinical Impression  PTA pt living with grandson in single story home with 2 steps to enter. Pt utilizing RW for household level ambulation, has HHAide 5x/week, 1.5 hr/day to assist with bathing and dressing. Pt is limited in safe mobility by edema and 3/4 DoE in presence of decreased strength, balance and endurance. Pt currently requires min A for transfers and min guard for ambulation of 25 feet with RW. PT recommending HHPT at discharge to improve safe mobility in pt's home environment. PT will continue to follow acutely.    Follow Up Recommendations Home health PT;Supervision - Intermittent    Equipment Recommendations  None recommended by PT    Recommendations for Other Services       Precautions / Restrictions Precautions Precautions: None Restrictions Weight Bearing Restrictions: No      Mobility  Bed Mobility               General bed mobility comments: OOB in recliner on entry  Transfers Overall transfer level: Needs assistance Equipment used: Rolling walker (2 wheeled) Transfers: Sit to/from Stand Sit to Stand: Min assist         General transfer comment: min A for power up and steadying, increased effort and use of momentum to come to standing  Ambulation/Gait Ambulation/Gait assistance: Min guard Gait Distance (Feet): 25 Feet Assistive device: Rolling walker (2 wheeled) Gait Pattern/deviations: Step-through pattern;Decreased step length - right;Decreased step length - left;Trunk flexed;Wide base of support;Shuffle Gait  velocity: slowed Gait velocity interpretation: <1.31 ft/sec, indicative of household ambulator General Gait Details: hands on min guard for safety, pt can not tolerate upright posture due to increased back pain, pt utilizes walker with elbows on hand grips, grasping the crossbar in front        Balance Overall balance assessment: Needs assistance Sitting-balance support: No upper extremity supported;Feet supported Sitting balance-Leahy Scale: Fair     Standing balance support: Bilateral upper extremity supported Standing balance-Leahy Scale: Poor Standing balance comment: requires steadying on forearms with RW                             Pertinent Vitals/Pain Pain Assessment: Faces Faces Pain Scale: Hurts even more Pain Location: bilateral knees and back  Pain Descriptors / Indicators: Grimacing;Guarding;Aching Pain Intervention(s): Limited activity within patient's tolerance;Monitored during session;Repositioned    Home Living Family/patient expects to be discharged to:: Private residence Living Arrangements: Other relatives(grandson) Available Help at Discharge: Family;Personal care attendant;Available PRN/intermittently Type of Home: House Home Access: Stairs to enter Entrance Stairs-Rails: None Entrance Stairs-Number of Steps: 2 Home Layout: One level Home Equipment: Walker - 2 wheels;Wheelchair - manual;Bedside commode;Grab bars - tub/shower;Hand held shower head;Shower seat      Prior Function Level of Independence: Needs assistance   Gait / Transfers Assistance Needed: ambulates household distances with RW  ADL's / Homemaking Assistance Needed: HHAide 5xweek 1.5hr/day, assist with bathing and dressing           Extremity/Trunk Assessment   Upper Extremity Assessment Upper Extremity Assessment: RUE deficits/detail;LUE deficits/detail RUE Deficits / Details: limited shoulder flexion  LUE Deficits / Details: limited shoulder flexion     Lower  Extremity Assessment Lower Extremity Assessment: RLE deficits/detail;LLE deficits/detail RLE Deficits / Details: decreased hip, knee and ankle ROM limited due to body habitus and edema, strength grossly assessed at 3+/5 RLE Sensation: WNL RLE Coordination: decreased fine motor LLE Deficits / Details: decreased hip, knee and ankle ROM limited due to body habitus and edema, strength grossly assessed at 3+/5 LLE Sensation: WNL LLE Coordination: decreased fine motor    Cervical / Trunk Assessment Cervical / Trunk Assessment: Kyphotic(prefers flexed posture due to back pain)  Communication   Communication: No difficulties  Cognition Arousal/Alertness: Awake/alert Behavior During Therapy: WFL for tasks assessed/performed Overall Cognitive Status: Within Functional Limits for tasks assessed                                        General Comments General comments (skin integrity, edema, etc.): pt continues to have 1+ pitting edema in upper calves, 3/4 DoE with ambulation SaO2 on RA 93%O2        Assessment/Plan    PT Assessment Patient needs continued PT services  PT Problem List Decreased strength;Decreased range of motion;Decreased activity tolerance;Decreased balance;Decreased mobility;Decreased coordination;Pain;Obesity       PT Treatment Interventions DME instruction;Gait training;Stair training;Functional mobility training;Therapeutic activities;Therapeutic exercise;Balance training;Cognitive remediation;Patient/family education    PT Goals (Current goals can be found in the Care Plan section)  Acute Rehab PT Goals Patient Stated Goal: be less SoB PT Goal Formulation: With patient Time For Goal Achievement: 07/04/19 Potential to Achieve Goals: Fair    Frequency Min 3X/week    AM-PAC PT "6 Clicks" Mobility  Outcome Measure Help needed turning from your back to your side while in a flat bed without using bedrails?: A Little Help needed moving from lying on  your back to sitting on the side of a flat bed without using bedrails?: A Little Help needed moving to and from a bed to a chair (including a wheelchair)?: A Little Help needed standing up from a chair using your arms (e.g., wheelchair or bedside chair)?: A Little Help needed to walk in hospital room?: A Little Help needed climbing 3-5 steps with a railing? : A Lot 6 Click Score: 17    End of Session   Activity Tolerance: Patient tolerated treatment well;Patient limited by fatigue Patient left: in chair;with call bell/phone within reach;with chair alarm set Nurse Communication: Mobility status PT Visit Diagnosis: Unsteadiness on feet (R26.81);Other abnormalities of gait and mobility (R26.89);Muscle weakness (generalized) (M62.81);Difficulty in walking, not elsewhere classified (R26.2);Pain Pain - Right/Left: (bilateral ) Pain - part of body: Knee    Time: [id number] PT Time Calculation (min) (ACUTE ONLY): 20 min   Charges:   PT Evaluation $PT Eval Moderate Complexity: 1 Mod          Halimah Bewick B. Migdalia Dk PT, DPT Acute Rehabilitation Services Pager [phone] Office [phone]   [place] 06/20/2019, 10:32 AM

## 2019-06-20 NOTE — Plan of Care (Signed)

## 2019-06-20 NOTE — Progress Notes (Signed)
Medications returned to patient she is being discharged. Writer gave her discharge instructions. Her PIV was removed as well as tele box. CCMD informed of removal of tele box. Patient instructed to call for her ride for discharge home. CNA instructed to assist patient with dressing. No further changes noted this time.

## 2019-12-01 ENCOUNTER — Encounter (HOSPITAL_COMMUNITY): Payer: Self-pay | Admitting: *Deleted

## 2019-12-01 ENCOUNTER — Emergency Department (HOSPITAL_COMMUNITY)
Admission: EM | Admit: 2019-12-01 | Discharge: 2019-12-01 | Disposition: A | Discharge status: Home / Self Care | Payer: Medicare Other | Attending: Emergency Medicine | Admitting: Emergency Medicine

## 2019-12-01 ENCOUNTER — Emergency Department (HOSPITAL_COMMUNITY): Payer: Medicare Other

## 2019-12-01 ENCOUNTER — Other Ambulatory Visit: Payer: Self-pay

## 2019-12-01 DIAGNOSIS — R42 Dizziness and giddiness: Secondary | ICD-10-CM | POA: Insufficient documentation

## 2019-12-01 DIAGNOSIS — Z79899 Other long term (current) drug therapy: Secondary | ICD-10-CM | POA: Insufficient documentation

## 2019-12-01 DIAGNOSIS — Z950 Presence of cardiac pacemaker: Secondary | ICD-10-CM | POA: Insufficient documentation

## 2019-12-01 DIAGNOSIS — I2510 Atherosclerotic heart disease of native coronary artery without angina pectoris: Secondary | ICD-10-CM | POA: Insufficient documentation

## 2019-12-01 DIAGNOSIS — I509 Heart failure, unspecified: Secondary | ICD-10-CM | POA: Insufficient documentation

## 2019-12-01 DIAGNOSIS — R112 Nausea with vomiting, unspecified: Secondary | ICD-10-CM

## 2019-12-01 DIAGNOSIS — R197 Diarrhea, unspecified: Secondary | ICD-10-CM | POA: Diagnosis not present

## 2019-12-01 DIAGNOSIS — E860 Dehydration: Secondary | ICD-10-CM

## 2019-12-01 DIAGNOSIS — Z20822 Contact with and (suspected) exposure to covid-19: Secondary | ICD-10-CM | POA: Insufficient documentation

## 2019-12-01 DIAGNOSIS — I110 Hypertensive heart disease with heart failure: Secondary | ICD-10-CM | POA: Diagnosis not present

## 2019-12-01 DIAGNOSIS — Z7982 Long term (current) use of aspirin: Secondary | ICD-10-CM | POA: Insufficient documentation

## 2019-12-01 LAB — BASIC METABOLIC PANEL
Anion gap: 12 (ref 5–15)
BUN: 13 mg/dL (ref 8–23)
CO2: 20 mmol/L — ABNORMAL LOW (ref 22–32)
Calcium: 9.1 mg/dL (ref 8.9–10.3)
Chloride: 111 mmol/L (ref 98–111)
Creatinine, Ser: 1.39 mg/dL — ABNORMAL HIGH (ref 0.44–1.00)
GFR calc Af Amer: 43 mL/min — ABNORMAL LOW (ref >60)
GFR calc non Af Amer: 37 mL/min — ABNORMAL LOW (ref >60)
Glucose, Bld: 96 mg/dL (ref 70–99)
Potassium: 4.1 mmol/L (ref 3.5–5.1)
Sodium: 143 mmol/L (ref 135–145)

## 2019-12-01 LAB — CBC
HCT: 44.7 % (ref 36.0–46.0)
Hemoglobin: 14.3 g/dL (ref 12.0–15.0)
MCH: 31.8 pg (ref 26.0–34.0)
MCHC: 32 g/dL (ref 30.0–36.0)
MCV: 99.3 fL (ref 80.0–100.0)
Platelets: 171 10*3/uL (ref 150–400)
RBC: 4.5 MIL/uL (ref 3.87–5.11)
RDW: 14.8 % (ref 11.5–15.5)
WBC: 4.3 10*3/uL (ref 4.0–10.5)
nRBC: 0 % (ref 0.0–0.2)

## 2019-12-01 LAB — URINALYSIS, ROUTINE W REFLEX MICROSCOPIC
Bilirubin Urine: NEGATIVE
Glucose, UA: NEGATIVE mg/dL
Hgb urine dipstick: NEGATIVE
Ketones, ur: NEGATIVE mg/dL
Leukocytes,Ua: NEGATIVE
Nitrite: NEGATIVE
Protein, ur: NEGATIVE mg/dL
Specific Gravity, Urine: 1.009 (ref 1.005–1.030)
pH: 7 (ref 5.0–8.0)

## 2019-12-01 LAB — BRAIN NATRIURETIC PEPTIDE: B Natriuretic Peptide: 105.3 pg/mL — ABNORMAL HIGH (ref 0.0–100.0)

## 2019-12-01 LAB — CBG MONITORING, ED: Glucose-Capillary: 85 mg/dL (ref 70–99)

## 2019-12-01 LAB — TROPONIN I (HIGH SENSITIVITY)
Troponin I (High Sensitivity): 7 ng/L (ref <18)
Troponin I (High Sensitivity): 6 ng/L (ref <18)

## 2019-12-01 LAB — POC SARS CORONAVIRUS 2 AG -  ED: SARS Coronavirus 2 Ag: NEGATIVE

## 2019-12-01 MED ORDER — ONDANSETRON HCL 4 MG/2ML IJ SOLN
4.0000 mg | Freq: Once | INTRAMUSCULAR | Status: AC
  Administered 2019-12-01: 15:00:00 4 mg via INTRAVENOUS
  Filled 2019-12-01: qty 2

## 2019-12-01 MED ORDER — SODIUM CHLORIDE 0.9 % IV BOLUS
500.0000 mL | Freq: Once | INTRAVENOUS | Status: AC
  Administered 2019-12-01: 15:00:00 500 mL via INTRAVENOUS

## 2019-12-01 MED ORDER — SODIUM CHLORIDE 0.9% FLUSH: 3.0000 mL | Freq: Once | INTRAVENOUS | Status: DC

## 2019-12-01 NOTE — ED Triage Notes (Signed)
Pt is here by ems from home due to weakness and dizziness since yesterday.  Pt states that she spoke with her cardiologist and he advised her to come to the ED.  No Cp with this.  Pt has some sob and n/v with this.  Pt has CHF but denies any weight gain.  VSS for ems.  No IV

## 2019-12-01 NOTE — Discharge Instructions (Addendum)
Please stop taking Linzess.  Please call Dr. Merrilee Jansky office on Monday morning in order to set up an appointment to see him.  If you experience any worsening symptoms, weakness please return to the emergency department.

## 2019-12-01 NOTE — ED Provider Notes (Signed)
[facility] EMERGENCY DEPARTMENT Provider Note   CSN: [id number] Arrival date & time: 12/01/19  1243     History Chief Complaint  Patient presents with  . Weakness    [name] is a 79 y.o. female.  79 y.o female with an extensive PMH including CHF, GERD,MI, CAD, pacemaker presents to the ED with a chief complaint of weakness, dizziness, and emesis x yesterday.  According to patient, she recently was placed on a new medication for constipation, according to her chart this was Linzess, states she took this Tuesday but had severe diarrhea yesterday, reports several episodes of diarrhea.  After this occurred she began to feel generalized weakness, fatigue along with dizziness.  Describes her dizziness as ringing to her right ear, and waves along the ocean.  This is not positional.  She also endorses multiple episodes of nonbilious, nonbloody emesis today, ports she has been nauseated most of the day. She reports no recent sick contacts. No trauma, compliance with medications. Does endorse SOB but this is chronic.   The history is provided by the patient and the EMS personnel.  Weakness Associated symptoms: dizziness, nausea, urgency and vomiting   Associated symptoms: no abdominal pain, no chest pain (reports soreness from emesis.), no diarrhea, no fever, no myalgias and no shortness of breath        Past Medical History:  Diagnosis Date  . Arthritis   . CHF (congestive heart failure) ([place])    05/2011 echo severe concentric LVH, normal systolic fxn, grade 1 diastolic dysfxn, paradoxical septal motion  . Coronary artery disease    Cath 04/2006 normal coronaries, mild-mod slow flow in all coronaries with slow distall runoff, mild-mod LV enlargement, EF 55%   . Depression   . Dysrhythmia   . GERD (gastroesophageal reflux disease)   . Hypercholesterolemia   . Hyperlipemia   . Hypertension   . MI (myocardial infarction) ([place])    NOT SURE HOW MANY  . Morbid  obesity ([place])   . Nocturia   . 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 Peripheral vascular disease ([place])   . Pneumonia NONE RECENT  . Sleep apnea    needs cpap but does not wear  . Stroke ([place])   . 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 Vertigo    takes meclizine daily    Patient Active Problem List   Diagnosis Date Noted  . Acute left systolic heart failure ([place]) 06/18/2019  . Acute systolic heart failure ([place]) 06/18/2019  . 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 Symptomatic sinus bradycardia 03/27/2014  . Pacemaker at end of battery life 03/27/2014  . CVA (cerebral infarction) 10/17/2012  . Headache(784.0) 10/17/2012  . Coronary artery disease   . Hypertension   . Hyperlipemia   . Arthritis   . CHF (congestive heart failure) ([place])   . Vertigo   . TIA (transient ischemic attack)   . Pacemaker   . MI (myocardial infarction) ([place])   . Morbid obesity ([place])     Past Surgical History:  Procedure Laterality Date  . ABDOMINAL HYSTERECTOMY    . CARDIAC CATHETERIZATION    . CATARACT EXTRACTION W/PHACO Left 08/29/2013   Procedure: CATARACT EXTRACTION PHACO AND INTRAOCULAR LENS PLACEMENT (IOC);  Surgeon: Adonis Brook, MD;  Location: [facility];  Service: Ophthalmology;  Laterality: Left;  . COLONOSCOPY WITH PROPOFOL N/A 01/07/2014   Procedure: COLONOSCOPY WITH PROPOFOL;  Surgeon: Juanita Craver, MD;  Location: [facility];  Service: Endoscopy;  Laterality: N/A;  . DILATION AND CURETTAGE OF UTERUS    . INSERT / REPLACE / REMOVE PACEMAKER    . PACEMAKER GENERATOR CHANGE N/A 03/27/2014   Procedure: PACEMAKER GENERATOR CHANGE;  Surgeon: Sanda Klein, MD;  Location: [facility] CATH LAB;  Service: Cardiovascular;  Laterality: N/A;  . PACEMAKER INSERTION    . PACEMAKER INSERTION    . TUBAL LIGATION    . VASCULAR SURGERY     vericose vein surgery     OB History   No obstetric history on file.     No family history on file.   Social History   Tobacco Use  . Smoking status: Never Smoker  . Smokeless tobacco: Never Used  Substance Use Topics  . Alcohol use: No  . Drug use: No    Home Medications Prior to Admission medications   Medication Sig Start Date End Date Taking? Authorizing Provider  albuterol (VENTOLIN HFA) 108 (90 Base) MCG/ACT inhaler Inhale 2 puffs into the lungs every 6 (six) hours as needed for wheezing or shortness of breath.  10/12/19  Yes [provider]  allopurinol (ZYLOPRIM) 100 MG tablet Take 100 mg by mouth daily.   Yes [provider]  amLODipine (NORVASC) 10 MG tablet Take 10 mg by mouth daily. 11/20/19  Yes [provider]  apixaban (ELIQUIS) 5 MG TABS tablet Take 5 mg by mouth 2 (two) times daily. 02/11/17  Yes [provider]  aspirin EC 81 MG tablet Take 81 mg by mouth daily.   Yes [provider]  clotrimazole (LOTRIMIN) 1 % cream Apply 1 application topically daily as needed (rash).  11/22/19  Yes [provider]  famotidine (PEPCID) 40 MG tablet Take 40 mg by mouth daily. 11/29/19  Yes [provider]  fluticasone (FLONASE) 50 MCG/ACT nasal spray Place 2 sprays into both nostrils at bedtime as needed for allergies or rhinitis.  07/08/19  Yes [provider]  furosemide (LASIX) 40 MG tablet Take 20 mg by mouth 2 (two) times daily.    Yes [provider]  hydrALAZINE (APRESOLINE) 50 MG tablet Take 50 mg by mouth 3 (three) times daily. 09/10/19  Yes [provider]  isosorbide mononitrate (IMDUR) 60 MG 24 hr tablet Take 60 mg by mouth every morning.    Yes [provider]  losartan (COZAAR) 100 MG tablet Take 100 mg by mouth daily.   Yes [provider]  lubiprostone (AMITIZA) 24 MCG capsule Take 24 mcg by mouth at bedtime. 11/26/19  Yes [provider]  meclizine (ANTIVERT) 25 MG tablet Take 12.5-25 mg by mouth 3 (three) times daily as needed for dizziness. For dizziness   Yes  [provider]  metoprolol succinate (TOPROL-XL) 100 MG 24 hr tablet Take 50-100 mg by mouth See admin instructions. Take one tablet (100 mg) by mouth every morning and 1/2 tablet (50 mg) at night 10/29/19  Yes [provider]  nitroGLYCERIN (NITROSTAT) 0.4 MG SL tablet Place 0.4 mg under the tongue every 5 (five) minutes as needed for chest pain.    Yes [provider]  oxybutynin (DITROPAN) 5 MG tablet Take 5 mg by mouth 2 (two) times daily.   Yes [provider]  oxyCODONE-acetaminophen (PERCOCET/ROXICET) 5-325 MG tablet Take 1 tablet by mouth 2 (two) times daily as needed (pain).  11/20/19  Yes [provider]  pantoprazole (PROTONIX) 40 MG tablet Take 40 mg by mouth  2 (two) times daily.    Yes [provider]  potassium chloride SA (K-DUR,KLOR-CON) 20 MEQ tablet Take 20 mEq by mouth 2 (two) times daily.    Yes [provider]  rosuvastatin (CRESTOR) 10 MG tablet Take 10 mg by mouth daily.    Yes [provider]  Vitamin D, Ergocalciferol, (DRISDOL) 1.25 MG (50000 UNIT) CAPS capsule Take 50,000 Units by mouth every Monday. 10/01/19  Yes [provider]  LINZESS 145 MCG CAPS capsule Take 145 mcg by mouth every morning. 11/28/19   [provider]    Allergies    Ampicillin  Review of Systems   Review of Systems  Constitutional: Negative for chills and fever.  HENT: Negative for sore throat.   Respiratory: Negative for shortness of breath.   Cardiovascular: Negative for chest pain (reports soreness from emesis.).  Gastrointestinal: Positive for nausea and vomiting. Negative for abdominal pain, constipation and diarrhea.  Genitourinary: Positive for urgency. Negative for flank pain.  Musculoskeletal: Negative for myalgias.  Skin: Negative for pallor and wound.  Neurological: Positive for dizziness and weakness.    Physical Exam Updated Vital Signs BP 138/80   Pulse 60   Temp 98.7 F (37.1 C) (Oral)    Resp 16   Wt (!) 160 kg   SpO2 98%   BMI 56.93 kg/m   Physical Exam Vitals and nursing note reviewed.  Constitutional:      General: She is not in acute distress.    Appearance: She is well-developed. She is obese.  HENT:     Head: Normocephalic and atraumatic.     Mouth/Throat:     Pharynx: No oropharyngeal exudate.  Eyes:     Pupils: Pupils are equal, round, and reactive to light.  Cardiovascular:     Rate and Rhythm: Regular rhythm.     Heart sounds: Normal heart sounds.  Pulmonary:     Effort: Pulmonary effort is normal. No respiratory distress.     Breath sounds: Normal breath sounds.  Abdominal:     General: Bowel sounds are normal. There is no distension.     Palpations: Abdomen is soft.     Tenderness: There is no abdominal tenderness.  Musculoskeletal:        General: No tenderness or deformity.     Cervical back: Normal range of motion.     Right lower leg: No edema.     Left lower leg: No edema.  Skin:    General: Skin is warm and dry.  Neurological:     Mental Status: She is alert and oriented to person, place, and time.     ED Results / Procedures / Treatments   Labs (all labs ordered are listed, but only abnormal results are displayed) Labs Reviewed  BASIC METABOLIC PANEL - Abnormal; Notable for the following components:      Result Value   CO2 20 (*)    Creatinine, Ser 1.39 (*)    GFR calc non Af Amer 37 (*)    GFR calc Af Amer 43 (*)    All other components within normal limits  URINALYSIS, ROUTINE W REFLEX MICROSCOPIC - Abnormal; Notable for the following components:   APPearance HAZY (*)    All other components within normal limits  BRAIN NATRIURETIC PEPTIDE - Abnormal; Notable for the following components:   B Natriuretic Peptide 105.3 (*)    All other components within normal limits  CBC  CBG MONITORING, ED  POC SARS CORONAVIRUS 2 AG -  ED  TROPONIN I (HIGH SENSITIVITY)  TROPONIN I (HIGH SENSITIVITY)    EKG EKG Interpretation   Date/Time:  Saturday December 01 2019 12:55:30 EST Ventricular Rate:  60 PR Interval:    QRS Duration: 183 QT Interval:  487 QTC Calculation: 487 R Axis:   -78 Text Interpretation: Sinus or ectopic atrial rhythm Probable left atrial enlargement Left bundle branch block No significant change since 8/20 Confirmed by Aletta Edouard [phone]) on 12/01/2019 1:10:16 PM   Radiology DG Chest Portable 1 View  Result Date: 12/01/2019 CLINICAL DATA:  Acute onset of generalized weakness and dizziness that began yesterday. Indwelling pacemaker. EXAM: PORTABLE CHEST 1 VIEW COMPARISON:  06/18/2019 and earlier. FINDINGS: Cardiac silhouette markedly enlarged, unchanged. RIGHT subclavian dual lead transvenous pacemaker unchanged and appears intact. Lungs clear. Bronchovascular markings normal. Pulmonary vascularity normal. No visible pleural effusions. No pneumothorax. IMPRESSION: Stable marked cardiomegaly. No acute cardiopulmonary disease. Electronically Signed   By: Evangeline Dakin M.D.   On: 12/01/2019 14:15    Procedures Procedures (including critical care time)  Medications Ordered in ED Medications  sodium chloride flush (NS) 0.9 % injection 3 mL (3 mLs Intravenous Not Given 12/01/19 1323)  ondansetron (ZOFRAN) injection 4 mg (4 mg Intravenous Given 12/01/19 1509)  sodium chloride 0.9 % bolus 500 mL (0 mLs Intravenous Stopped 12/01/19 1651)    ED Course  I have reviewed the triage vital signs and the nursing notes.  Pertinent labs & imaging results that were available during my care of the patient were reviewed by me and considered in my medical decision making (see chart for details).  Clinical Course as of Nov 30 1745  Sat Feb 06, 686  135 79 year old female presenting with generalized weakness and frequent bouts of diarrhea after starting a new medication.  Vitals unremarkable and abdominal exam soft without any masses guarding or rebound.  Getting some screening labs.  Likely should be able to go  home if you find any serious complications.   [MB]    Clinical Course User Index [MB] Hayden Rasmussen, MD   MDM Rules/Calculators/A&P    With a past medical history of CHF presents to the ED with new onset of dizziness, nausea, vomiting since yesterday.  According to patient's records, she was recently placed on Linzess for constipation, previously taken [place]. She reports taking this medication Tuesday, had several episodes of diarrhea yesterday, today she began to experience nonbilious, nonbloody emesis.  She also reports dizziness, describing these as waves in the ocean.  She reports compliance with her other medication.  No fevers, blood in her stool, blood in her emesis.  CBC without any leukocytosis, hemoglobin is unremarkable.  BMP without any electrolyte abnormality, and level is within normal limits.  BNP slightly elevated at 105.3, however chest x-ray does not show any signs of pulmonary edema, stable cardiomegaly noted.  CBG on arrival was 85.  2:27 PM spoke to Dr. Doylene Canard who recommended patient receive half a bolus of saline along with hold Linzess.  She should not be taking both of those medications combine (amitizia & linzess).  She will need to schedule an appointment with him to see him Monday. Delta troponins remain flat.  Covid negative.  Patient was reassessed, reports improvement in her symptoms after Zofran.  Reports she has not been taking Amitiza along with Linzess.  Was advised to discontinue Linzess.  She did receive 500 bolus while in the ED. p.o. challenge is currently waiting.  No signs of infection, suspect the symptoms were  likely being caused due to dehydration as she has not used Linzess in the past.  6:00 PM p.o. challenge has been completed, patient is requesting discharge home as daughter is coming to pick her up.  Vitals are within normal limits.  Patient stable for discharge.   Portions of this note were generated with [employer].  Dictation errors may occur despite best attempts at proofreading.  Final Clinical Impression(s) / ED Diagnoses Final diagnoses:  Dehydration  Nausea vomiting and diarrhea    Rx / DC Orders ED Discharge Orders    None       Janeece Fitting, PA-C 12/01/19 1800    Hayden Rasmussen, MD 12/01/19 2219

## 2019-12-02 ENCOUNTER — Other Ambulatory Visit: Payer: Self-pay

## 2019-12-02 ENCOUNTER — Emergency Department (HOSPITAL_COMMUNITY)
Admission: EM | Admit: 2019-12-02 | Discharge: 2019-12-02 | Disposition: A | Discharge status: Home / Self Care | Payer: Medicare Other | Attending: Emergency Medicine | Admitting: Emergency Medicine

## 2019-12-02 ENCOUNTER — Emergency Department (HOSPITAL_COMMUNITY): Payer: Medicare Other

## 2019-12-02 ENCOUNTER — Encounter (HOSPITAL_COMMUNITY): Payer: Self-pay

## 2019-12-02 DIAGNOSIS — Z7982 Long term (current) use of aspirin: Secondary | ICD-10-CM | POA: Insufficient documentation

## 2019-12-02 DIAGNOSIS — R1030 Lower abdominal pain, unspecified: Secondary | ICD-10-CM | POA: Diagnosis present

## 2019-12-02 DIAGNOSIS — I252 Old myocardial infarction: Secondary | ICD-10-CM | POA: Insufficient documentation

## 2019-12-02 DIAGNOSIS — Z7901 Long term (current) use of anticoagulants: Secondary | ICD-10-CM | POA: Insufficient documentation

## 2019-12-02 DIAGNOSIS — I110 Hypertensive heart disease with heart failure: Secondary | ICD-10-CM | POA: Diagnosis not present

## 2019-12-02 DIAGNOSIS — I5020 Unspecified systolic (congestive) heart failure: Secondary | ICD-10-CM | POA: Diagnosis not present

## 2019-12-02 DIAGNOSIS — R1084 Generalized abdominal pain: Secondary | ICD-10-CM | POA: Insufficient documentation

## 2019-12-02 DIAGNOSIS — R197 Diarrhea, unspecified: Secondary | ICD-10-CM | POA: Insufficient documentation

## 2019-12-02 DIAGNOSIS — Z950 Presence of cardiac pacemaker: Secondary | ICD-10-CM | POA: Insufficient documentation

## 2019-12-02 DIAGNOSIS — Z79899 Other long term (current) drug therapy: Secondary | ICD-10-CM | POA: Insufficient documentation

## 2019-12-02 DIAGNOSIS — Z8673 Personal history of transient ischemic attack (TIA), and cerebral infarction without residual deficits: Secondary | ICD-10-CM | POA: Diagnosis not present

## 2019-12-02 LAB — CBC WITH DIFFERENTIAL/PLATELET
Abs Immature Granulocytes: 0.02 10*3/uL (ref 0.00–0.07)
Basophils Absolute: 0.1 10*3/uL (ref 0.0–0.1)
Basophils Relative: 1 %
Eosinophils Absolute: 0.2 10*3/uL (ref 0.0–0.5)
Eosinophils Relative: 4 %
HCT: 44.5 % (ref 36.0–46.0)
Hemoglobin: 13.9 g/dL (ref 12.0–15.0)
Immature Granulocytes: 1 %
Lymphocytes Relative: 21 %
Lymphs Abs: 0.9 10*3/uL (ref 0.7–4.0)
MCH: 31.3 pg (ref 26.0–34.0)
MCHC: 31.2 g/dL (ref 30.0–36.0)
MCV: 100.2 fL — ABNORMAL HIGH (ref 80.0–100.0)
Monocytes Absolute: 0.4 10*3/uL (ref 0.1–1.0)
Monocytes Relative: 9 %
Neutro Abs: 2.7 10*3/uL (ref 1.7–7.7)
Neutrophils Relative %: 64 %
Platelets: 180 10*3/uL (ref 150–400)
RBC: 4.44 MIL/uL (ref 3.87–5.11)
RDW: 14.8 % (ref 11.5–15.5)
WBC: 4.2 10*3/uL (ref 4.0–10.5)
nRBC: 0 % (ref 0.0–0.2)

## 2019-12-02 LAB — COMPREHENSIVE METABOLIC PANEL
ALT: 13 U/L (ref 0–44)
AST: 20 U/L (ref 15–41)
Albumin: 3.5 g/dL (ref 3.5–5.0)
Alkaline Phosphatase: 87 U/L (ref 38–126)
Anion gap: 11 (ref 5–15)
BUN: 16 mg/dL (ref 8–23)
CO2: 22 mmol/L (ref 22–32)
Calcium: 9.4 mg/dL (ref 8.9–10.3)
Chloride: 111 mmol/L (ref 98–111)
Creatinine, Ser: 1.54 mg/dL — ABNORMAL HIGH (ref 0.44–1.00)
GFR calc Af Amer: 38 mL/min — ABNORMAL LOW (ref >60)
GFR calc non Af Amer: 32 mL/min — ABNORMAL LOW (ref >60)
Glucose, Bld: 115 mg/dL — ABNORMAL HIGH (ref 70–99)
Potassium: 3.7 mmol/L (ref 3.5–5.1)
Sodium: 144 mmol/L (ref 135–145)
Total Bilirubin: 0.6 mg/dL (ref 0.3–1.2)
Total Protein: 7.1 g/dL (ref 6.5–8.1)

## 2019-12-02 LAB — LIPASE, BLOOD: Lipase: 17 U/L (ref 11–51)

## 2019-12-02 MED ORDER — HYDROCODONE-ACETAMINOPHEN 5-325 MG PO TABS
1.0000 | ORAL_TABLET | Freq: Once | ORAL | Status: AC
  Administered 2019-12-02: 1 via ORAL
  Filled 2019-12-02: qty 1

## 2019-12-02 NOTE — ED Notes (Signed)
Pt had a bowel movement while on the back of the Ambulance. With my assistance and a walker Pt was able to walk next door to the bathroom to have 2 more Bowel movements. Cleaned Pt and helped Pt back to RM and helped her back in Bed. Pt still complaining of her abdomen hurting

## 2019-12-02 NOTE — Discharge Instructions (Addendum)
Follow-up with your primary care provider.  Return to ER for new or worsening symptoms.

## 2019-12-02 NOTE — ED Triage Notes (Signed)
Patient arrived by [facility] for abdominal cramping. Took laxative this am and had result in back of EMS truck. Patient with large amount of stool on arrival and assisted with cleaning. Reports pain resolved

## 2019-12-02 NOTE — ED Provider Notes (Signed)
[facility] EMERGENCY DEPARTMENT Provider Note   CSN: [id number] Arrival date & time: 12/02/19  1047     History Chief Complaint  Patient presents with  . Abdominal Pain    [name] is a 79 y.o. female.  79yo female with past medical history of CHF, CVA, pacemaker in place, on Eliquis brought in by EMS from home for abdominal pain. Patient states she had diarrhea on Friday after taking her Linzess (new rx), was seen in the ER and dc home. Patient states she was constipated yesterday so she took a stool softener and a laxative today and then developed cramping lower abdominal pain.  Patient called EMS for her abdominal pain, in route to the hospital began having diarrhea.  Patient continues to have multiple episodes of loose stool while in the ER, nonbloody.  Reports 1 episode of vomiting while in the ambulance.  Reports baseline shortness of breath, not having any chest pain, fevers, chills or changes in bladder habits.  Patient was seen emergency room yesterday for dizziness with nausea and vomiting, reportedly had had diarrhea the day prior as she been taking her new medication Linzess.  No other complaints or concerns today.        Past Medical History:  Diagnosis Date  . Arthritis   . CHF (congestive heart failure) ([place])    05/2011 echo severe concentric LVH, normal systolic fxn, grade 1 diastolic dysfxn, paradoxical septal motion  . Coronary artery disease    Cath 04/2006 normal coronaries, mild-mod slow flow in all coronaries with slow distall runoff, mild-mod LV enlargement, EF 55%   . Depression   . Dysrhythmia   . GERD (gastroesophageal reflux disease)   . Hypercholesterolemia   . Hyperlipemia   . Hypertension   . MI (myocardial infarction) ([place])    NOT SURE HOW MANY  . Morbid obesity ([place])   . Nocturia   . 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 Peripheral vascular disease ([place])   . Pneumonia NONE RECENT  . Sleep apnea    needs cpap  but does not wear  . Stroke ([place])   . 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 Vertigo    takes meclizine daily    Patient Active Problem List   Diagnosis Date Noted  . Acute left systolic heart failure ([place]) 06/18/2019  . Acute systolic heart failure ([place]) 06/18/2019  . 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 Symptomatic sinus bradycardia 03/27/2014  . Pacemaker at end of battery life 03/27/2014  . CVA (cerebral infarction) 10/17/2012  . Headache(784.0) 10/17/2012  . Coronary artery disease   . Hypertension   . Hyperlipemia   . Arthritis   . CHF (congestive heart failure) ([place])   . Vertigo   . TIA (transient ischemic attack)   . Pacemaker   . MI (myocardial infarction) ([place])   . Morbid obesity ([place])     Past Surgical History:  Procedure Laterality Date  . ABDOMINAL HYSTERECTOMY    . CARDIAC CATHETERIZATION    . CATARACT EXTRACTION W/PHACO Left 08/29/2013   Procedure: CATARACT EXTRACTION PHACO AND INTRAOCULAR LENS PLACEMENT (IOC);  Surgeon: Adonis Brook, MD;  Location: [facility];  Service: Ophthalmology;  Laterality: Left;  . COLONOSCOPY WITH PROPOFOL N/A 01/07/2014   Procedure: COLONOSCOPY WITH PROPOFOL;  Surgeon: Juanita Craver, MD;  Location: [facility];  Service: Endoscopy;  Laterality: N/A;  . DILATION AND CURETTAGE OF  UTERUS    . INSERT / REPLACE / REMOVE PACEMAKER    . PACEMAKER GENERATOR CHANGE N/A 03/27/2014   Procedure: PACEMAKER GENERATOR CHANGE;  Surgeon: Sanda Klein, MD;  Location: [facility] CATH [facility];  Service: Cardiovascular;  Laterality: N/A;  . PACEMAKER INSERTION    . PACEMAKER INSERTION    . TUBAL LIGATION    . VASCULAR SURGERY     vericose vein surgery     OB History   No obstetric history on file.     No family history on file.  Social History   Tobacco Use  . Smoking status: Never Smoker  . Smokeless tobacco: Never Used  Substance Use Topics  . Alcohol use: No  . Drug use: No    Home  Medications Prior to Admission medications   Medication Sig Start Date End Date Taking? Authorizing Provider  albuterol (VENTOLIN HFA) 108 (90 Base) MCG/ACT inhaler Inhale 2 puffs into the lungs every 6 (six) hours as needed for wheezing or shortness of breath.  10/12/19   [provider]  allopurinol (ZYLOPRIM) 100 MG tablet Take 100 mg by mouth daily.    [provider]  amLODipine (NORVASC) 10 MG tablet Take 10 mg by mouth daily. 11/20/19   [provider]  apixaban (ELIQUIS) 5 MG TABS tablet Take 5 mg by mouth 2 (two) times daily. 02/11/17   [provider]  aspirin EC 81 MG tablet Take 81 mg by mouth daily.    [provider]  clotrimazole (LOTRIMIN) 1 % cream Apply 1 application topically daily as needed (rash).  11/22/19   [provider]  famotidine (PEPCID) 40 MG tablet Take 40 mg by mouth daily. 11/29/19   [provider]  fluticasone (FLONASE) 50 MCG/ACT nasal spray Place 2 sprays into both nostrils at bedtime as needed for allergies or rhinitis.  07/08/19   [provider]  furosemide (LASIX) 40 MG tablet Take 20 mg by mouth 2 (two) times daily.     [provider]  hydrALAZINE (APRESOLINE) 50 MG tablet Take 50 mg by mouth 3 (three) times daily. 09/10/19   [provider]  isosorbide mononitrate (IMDUR) 60 MG 24 hr tablet Take 60 mg by mouth every morning.     [provider]  LINZESS 145 MCG CAPS capsule Take 145 mcg by mouth every morning. 11/28/19   [provider]  losartan (COZAAR) 100 MG tablet Take 100 mg by mouth daily.    [provider]  lubiprostone (AMITIZA) 24 MCG capsule Take 24 mcg by mouth at bedtime. 11/26/19   [provider]  meclizine (ANTIVERT) 25 MG tablet Take 12.5-25 mg by mouth 3 (three) times daily as needed for dizziness. For dizziness    [provider]  metoprolol succinate (TOPROL-XL) 100 MG 24 hr tablet Take 50-100 mg by mouth See  admin instructions. Take one tablet (100 mg) by mouth every morning and 1/2 tablet (50 mg) at night 10/29/19   [provider]  nitroGLYCERIN (NITROSTAT) 0.4 MG SL tablet Place 0.4 mg under the tongue every 5 (five) minutes as needed for chest pain.     [provider]  oxybutynin (DITROPAN) 5 MG tablet Take 5 mg by mouth 2 (two) times daily.    [provider]  oxyCODONE-acetaminophen (PERCOCET/ROXICET) 5-325 MG tablet Take 1 tablet by mouth 2 (two) times daily as needed (pain).  11/20/19   [provider]  pantoprazole (PROTONIX) 40 MG tablet Take 40 mg by mouth 2 (  two) times daily.     [provider]  potassium chloride SA (K-DUR,KLOR-CON) 20 MEQ tablet Take 20 mEq by mouth 2 (two) times daily.     [provider]  rosuvastatin (CRESTOR) 10 MG tablet Take 10 mg by mouth daily.     [provider]  Vitamin D, Ergocalciferol, (DRISDOL) 1.25 MG (50000 UNIT) CAPS capsule Take 50,000 Units by mouth every Monday. 10/01/19   [provider]    Allergies    Ampicillin  Review of Systems   Review of Systems  Constitutional: Negative for chills, diaphoresis and fever.  Respiratory: Negative for shortness of breath.   Cardiovascular: Negative for chest pain.  Gastrointestinal: Positive for abdominal pain, diarrhea, nausea and vomiting. Negative for blood in stool and constipation.  Genitourinary: Negative for difficulty urinating and dysuria.  Musculoskeletal: Negative for arthralgias and myalgias.  Skin: Negative for rash and wound.  Allergic/Immunologic: Negative for immunocompromised state.  Neurological: Negative for weakness.  Hematological: Negative for adenopathy.  Psychiatric/Behavioral: Negative for confusion.  All other systems reviewed and are negative.   Physical Exam Updated Vital Signs BP (!) 152/77   Pulse 60   Temp 98.6 F (37 C) (Oral)   Resp 17   SpO2 100%   Physical Exam Vitals and nursing note  reviewed.  Constitutional:      General: She is not in acute distress.    Appearance: She is well-developed. She is obese. She is not diaphoretic.  HENT:     Head: Normocephalic and atraumatic.  Cardiovascular:     Rate and Rhythm: Normal rate and regular rhythm.     Heart sounds: Normal heart sounds.  Pulmonary:     Effort: Pulmonary effort is normal.     Breath sounds: Normal breath sounds.  Abdominal:     Palpations: Abdomen is soft.     Tenderness: There is abdominal tenderness in the suprapubic area and left lower quadrant. There is no right CVA tenderness or left CVA tenderness.  Skin:    General: Skin is warm and dry.     Findings: No rash.  Neurological:     Mental Status: She is alert and oriented to person, place, and time.  Psychiatric:        Behavior: Behavior normal.     ED Results / Procedures / Treatments   Labs (all labs ordered are listed, but only abnormal results are displayed) Labs Reviewed  CBC WITH DIFFERENTIAL/PLATELET - Abnormal; Notable for the following components:      Result Value   MCV 100.2 (*)    All other components within normal limits  COMPREHENSIVE METABOLIC PANEL - Abnormal; Notable for the following components:   Glucose, Bld 115 (*)    Creatinine, Ser 1.54 (*)    GFR calc non Af Amer 32 (*)    GFR calc Af Amer 38 (*)    All other components within normal limits  LIPASE, BLOOD  URINALYSIS, ROUTINE W REFLEX MICROSCOPIC    EKG None  Radiology CT Abdomen Pelvis Wo Contrast  Result Date: 12/02/2019 CLINICAL DATA:  79 year old presenting with acute onset of diffuse crampy abdominal pain. EXAM: CT ABDOMEN AND PELVIS WITHOUT CONTRAST TECHNIQUE: Multidetector CT imaging of the abdomen and pelvis was performed following the standard protocol without IV contrast. COMPARISON:  None. FINDINGS: Lower chest: Visualized lung bases clear. Mild BILATERAL lower lobe bronchiectasis, LEFT greater than RIGHT. Heart enlarged. Transvenous pacemaker lead  tips at the RV apex and in the RIGHT atrial appendage. Hepatobiliary:  Normal unenhanced appearance of the liver. Small gallstones layering dependently in the gallbladder, on the order of 4-5 mm in size. No pericholecystic edema/inflammation. No biliary ductal dilation. Pancreas: Markedly atrophic and fatty replaced. No pancreatic mass or peripancreatic edema/inflammation. Spleen: Normal unenhanced appearance. Adrenals/Urinary Tract: Normal appearing adrenal glands. Benign exophytic cyst arising from the LOWER pole of the RIGHT kidney measuring approximately 6.2 x 3.8 x 3.7 cm. Allowing for the unenhanced technique, no solid masses involving either kidney. No hydronephrosis. No urinary tract calculi. Normal appearing urinary bladder. Stomach/Bowel: Small hiatal hernia. Otherwise normal appearing decompressed stomach. Normal-appearing small bowel. Entire colon relatively decompressed. Diffuse colonic diverticulosis, most extensive in the sigmoid region, without evidence of acute diverticulitis. Normal appearing gas-filled appendix in the RIGHT upper pelvis. Vascular/Lymphatic: Mild aortoiliac atherosclerosis without evidence of aneurysm. No pathologic lymphadenopathy. Reproductive: Surgically absent uterus. No adnexal masses. Other: Phleboliths in both sides of the pelvis. Musculoskeletal: DISH and spondylosis involving the lower thoracic spine. Facet degenerative changes diffusely throughout the lumbar spine. Degenerative disc disease and spondylosis at L3-4, L4-5 and L5-S1. No acute findings. IMPRESSION: 1. No acute abnormalities involving the abdomen or pelvis. 2. Diffuse colonic diverticulosis, most extensive in the sigmoid region, without evidence of acute diverticulitis. 3. Cholelithiasis without evidence of acute cholecystitis. 4. Small hiatal hernia. Aortic Atherosclerosis, mild.  (ICD10-I70.0). Electronically Signed   By: Evangeline Dakin M.D.   On: 12/02/2019 12:43   DG Chest Portable 1 View  Result  Date: 12/01/2019 CLINICAL DATA:  Acute onset of generalized weakness and dizziness that began yesterday. Indwelling pacemaker. EXAM: PORTABLE CHEST 1 VIEW COMPARISON:  06/18/2019 and earlier. FINDINGS: Cardiac silhouette markedly enlarged, unchanged. RIGHT subclavian dual lead transvenous pacemaker unchanged and appears intact. Lungs clear. Bronchovascular markings normal. Pulmonary vascularity normal. No visible pleural effusions. No pneumothorax. IMPRESSION: Stable marked cardiomegaly. No acute cardiopulmonary disease. Electronically Signed   By: Evangeline Dakin M.D.   On: 12/01/2019 14:15    Procedures Procedures (including critical care time)  Medications Ordered in ED Medications  HYDROcodone-acetaminophen (NORCO/VICODIN) 5-325 MG per tablet 1 tablet (1 tablet Oral Given 12/02/19 1402)    ED Course  I have reviewed the triage vital signs and the nursing notes.  Pertinent labs & imaging results that were available during my care of the patient were reviewed by me and considered in my medical decision making (see chart for details).  Clinical Course as of Dec 01 1630  Sun Dec 01, 6230  [id number] 79 year old female brought in by EMS, reports abdominal pain and diarrhea after taking a stool softener and laxative today.  Also vomiting x1 episode in the ambulance.  On exam patient has left-sided abdominal tenderness, reports frequent nonbloody stools, no changes in bladder habits, no fevers or chills.  Labs today include CBC and CMP as well as lipase, no significant changes from patient's previous.  Urinalysis ordered however no urinary symptoms, urinalysis from yesterday unremarkable.  Patient is afebrile, vitals reviewed.  CT scan is unremarkable, shows diverticulosis without signs of diverticulitis, no source for patient's symptoms today.  Discussed results with patient's granddaughter with patient's permission.  Recommend patient follow-up with her PCP, return to ER for new   [LM]    Clinical Course  User Index [LM] Roque Lias   MDM Rules/Calculators/A&P                      Final Clinical Impression(s) / ED Diagnoses Final diagnoses:  Diarrhea, unspecified type  Generalized abdominal pain  Rx / DC Orders ED Discharge Orders    None       Roque Lias 12/02/19 1632    Lacretia Leigh, MD 12/04/19 [phone]

## 2020-05-20 DIAGNOSIS — I6990 Unspecified sequelae of unspecified cerebrovascular disease: Secondary | ICD-10-CM | POA: Insufficient documentation

## 2020-05-20 DIAGNOSIS — G4730 Sleep apnea, unspecified: Secondary | ICD-10-CM | POA: Insufficient documentation

## 2020-05-20 DIAGNOSIS — G4710 Hypersomnia, unspecified: Secondary | ICD-10-CM | POA: Insufficient documentation

## 2020-05-20 DIAGNOSIS — M109 Gout, unspecified: Secondary | ICD-10-CM | POA: Insufficient documentation

## 2020-05-20 DIAGNOSIS — R609 Edema, unspecified: Secondary | ICD-10-CM | POA: Insufficient documentation

## 2020-05-20 DIAGNOSIS — M899 Disorder of bone, unspecified: Secondary | ICD-10-CM

## 2020-05-20 HISTORY — DX: Disorder of bone, unspecified: M89.9

## 2020-05-20 HISTORY — DX: Disorder of mineral metabolism, unspecified: E83.9

## 2020-05-22 ENCOUNTER — Inpatient Hospital Stay (HOSPITAL_COMMUNITY)
Admission: AD | Admit: 2020-05-22 | Discharge: 2020-05-28 | DRG: 286 | Disposition: A | Discharge status: Home Health | Payer: Medicare Other | Source: Ambulatory Visit | Attending: Cardiovascular Disease | Admitting: Cardiovascular Disease

## 2020-05-22 ENCOUNTER — Inpatient Hospital Stay (HOSPITAL_COMMUNITY): Payer: Medicare Other

## 2020-05-22 ENCOUNTER — Encounter (HOSPITAL_COMMUNITY): Payer: Self-pay | Admitting: Cardiovascular Disease

## 2020-05-22 DIAGNOSIS — I739 Peripheral vascular disease, unspecified: Secondary | ICD-10-CM | POA: Diagnosis present

## 2020-05-22 DIAGNOSIS — Z23 Encounter for immunization: Secondary | ICD-10-CM

## 2020-05-22 DIAGNOSIS — I447 Left bundle-branch block, unspecified: Secondary | ICD-10-CM | POA: Diagnosis present

## 2020-05-22 DIAGNOSIS — I252 Old myocardial infarction: Secondary | ICD-10-CM

## 2020-05-22 DIAGNOSIS — I5033 Acute on chronic diastolic (congestive) heart failure: Secondary | ICD-10-CM | POA: Diagnosis present

## 2020-05-22 DIAGNOSIS — E876 Hypokalemia: Secondary | ICD-10-CM | POA: Diagnosis present

## 2020-05-22 DIAGNOSIS — E7800 Pure hypercholesterolemia, unspecified: Secondary | ICD-10-CM | POA: Diagnosis present

## 2020-05-22 DIAGNOSIS — Z961 Presence of intraocular lens: Secondary | ICD-10-CM | POA: Diagnosis present

## 2020-05-22 DIAGNOSIS — Z950 Presence of cardiac pacemaker: Secondary | ICD-10-CM

## 2020-05-22 DIAGNOSIS — I441 Atrioventricular block, second degree: Secondary | ICD-10-CM | POA: Diagnosis present

## 2020-05-22 DIAGNOSIS — Z6841 Body Mass Index (BMI) 40.0 and over, adult: Secondary | ICD-10-CM

## 2020-05-22 DIAGNOSIS — Z7901 Long term (current) use of anticoagulants: Secondary | ICD-10-CM

## 2020-05-22 DIAGNOSIS — R0602 Shortness of breath: Secondary | ICD-10-CM | POA: Diagnosis present

## 2020-05-22 DIAGNOSIS — E785 Hyperlipidemia, unspecified: Secondary | ICD-10-CM | POA: Diagnosis present

## 2020-05-22 DIAGNOSIS — N184 Chronic kidney disease, stage 4 (severe): Secondary | ICD-10-CM | POA: Diagnosis present

## 2020-05-22 DIAGNOSIS — Z90710 Acquired absence of both cervix and uterus: Secondary | ICD-10-CM | POA: Diagnosis not present

## 2020-05-22 DIAGNOSIS — I130 Hypertensive heart and chronic kidney disease with heart failure and stage 1 through stage 4 chronic kidney disease, or unspecified chronic kidney disease: Principal | ICD-10-CM | POA: Diagnosis present

## 2020-05-22 DIAGNOSIS — Z880 Allergy status to penicillin: Secondary | ICD-10-CM

## 2020-05-22 DIAGNOSIS — M1990 Unspecified osteoarthritis, unspecified site: Secondary | ICD-10-CM | POA: Diagnosis present

## 2020-05-22 DIAGNOSIS — Z79899 Other long term (current) drug therapy: Secondary | ICD-10-CM

## 2020-05-22 DIAGNOSIS — K219 Gastro-esophageal reflux disease without esophagitis: Secondary | ICD-10-CM | POA: Diagnosis present

## 2020-05-22 DIAGNOSIS — I890 Lymphedema, not elsewhere classified: Secondary | ICD-10-CM | POA: Diagnosis present

## 2020-05-22 DIAGNOSIS — Z8673 Personal history of transient ischemic attack (TIA), and cerebral infarction without residual deficits: Secondary | ICD-10-CM | POA: Diagnosis not present

## 2020-05-22 DIAGNOSIS — R0603 Acute respiratory distress: Secondary | ICD-10-CM | POA: Diagnosis present

## 2020-05-22 DIAGNOSIS — Z888 Allergy status to other drugs, medicaments and biological substances status: Secondary | ICD-10-CM

## 2020-05-22 DIAGNOSIS — I5032 Chronic diastolic (congestive) heart failure: Secondary | ICD-10-CM | POA: Diagnosis present

## 2020-05-22 DIAGNOSIS — Z9842 Cataract extraction status, left eye: Secondary | ICD-10-CM | POA: Diagnosis not present

## 2020-05-22 DIAGNOSIS — I878 Other specified disorders of veins: Secondary | ICD-10-CM | POA: Diagnosis present

## 2020-05-22 DIAGNOSIS — I10 Essential (primary) hypertension: Secondary | ICD-10-CM | POA: Diagnosis present

## 2020-05-22 DIAGNOSIS — G4730 Sleep apnea, unspecified: Secondary | ICD-10-CM | POA: Diagnosis present

## 2020-05-22 DIAGNOSIS — Z7982 Long term (current) use of aspirin: Secondary | ICD-10-CM

## 2020-05-22 DIAGNOSIS — I25118 Atherosclerotic heart disease of native coronary artery with other forms of angina pectoris: Secondary | ICD-10-CM | POA: Diagnosis present

## 2020-05-22 DIAGNOSIS — I2510 Atherosclerotic heart disease of native coronary artery without angina pectoris: Secondary | ICD-10-CM | POA: Diagnosis present

## 2020-05-22 DIAGNOSIS — Z20822 Contact with and (suspected) exposure to covid-19: Secondary | ICD-10-CM | POA: Diagnosis present

## 2020-05-22 DIAGNOSIS — R002 Palpitations: Secondary | ICD-10-CM | POA: Diagnosis present

## 2020-05-22 DIAGNOSIS — R9439 Abnormal result of other cardiovascular function study: Secondary | ICD-10-CM | POA: Diagnosis present

## 2020-05-22 HISTORY — DX: Dyspnea, unspecified: R06.00

## 2020-05-22 LAB — COMPREHENSIVE METABOLIC PANEL
ALT: 12 U/L (ref 0–44)
AST: 19 U/L (ref 15–41)
Albumin: 3.5 g/dL (ref 3.5–5.0)
Alkaline Phosphatase: 78 U/L (ref 38–126)
Anion gap: 11 (ref 5–15)
BUN: 25 mg/dL — ABNORMAL HIGH (ref 8–23)
CO2: 28 mmol/L (ref 22–32)
Calcium: 8.6 mg/dL — ABNORMAL LOW (ref 8.9–10.3)
Chloride: 105 mmol/L (ref 98–111)
Creatinine, Ser: 1.91 mg/dL — ABNORMAL HIGH (ref 0.44–1.00)
GFR calc Af Amer: 29 mL/min — ABNORMAL LOW (ref >60)
GFR calc non Af Amer: 25 mL/min — ABNORMAL LOW (ref >60)
Glucose, Bld: 116 mg/dL — ABNORMAL HIGH (ref 70–99)
Potassium: 3.1 mmol/L — ABNORMAL LOW (ref 3.5–5.1)
Sodium: 144 mmol/L (ref 135–145)
Total Bilirubin: 1 mg/dL (ref 0.3–1.2)
Total Protein: 7 g/dL (ref 6.5–8.1)

## 2020-05-22 LAB — CBC WITH DIFFERENTIAL/PLATELET
Abs Immature Granulocytes: 0.03 10*3/uL (ref 0.00–0.07)
Basophils Absolute: 0.1 10*3/uL (ref 0.0–0.1)
Basophils Relative: 1 %
Eosinophils Absolute: 0.2 10*3/uL (ref 0.0–0.5)
Eosinophils Relative: 4 %
HCT: 38.9 % (ref 36.0–46.0)
Hemoglobin: 12.9 g/dL (ref 12.0–15.0)
Immature Granulocytes: 1 %
Lymphocytes Relative: 27 %
Lymphs Abs: 1.5 10*3/uL (ref 0.7–4.0)
MCH: 31.9 pg (ref 26.0–34.0)
MCHC: 33.2 g/dL (ref 30.0–36.0)
MCV: 96 fL (ref 80.0–100.0)
Monocytes Absolute: 0.8 10*3/uL (ref 0.1–1.0)
Monocytes Relative: 14 %
Neutro Abs: 2.9 10*3/uL (ref 1.7–7.7)
Neutrophils Relative %: 53 %
Platelets: 189 10*3/uL (ref 150–400)
RBC: 4.05 MIL/uL (ref 3.87–5.11)
RDW: 15.2 % (ref 11.5–15.5)
WBC: 5.5 10*3/uL (ref 4.0–10.5)
nRBC: 0 % (ref 0.0–0.2)

## 2020-05-22 LAB — BRAIN NATRIURETIC PEPTIDE: B Natriuretic Peptide: 73.5 pg/mL (ref 0.0–100.0)

## 2020-05-22 LAB — HEMOGLOBIN A1C
Hgb A1c MFr Bld: 6 % — ABNORMAL HIGH (ref 4.8–5.6)
Mean Plasma Glucose: 125.5 mg/dL

## 2020-05-22 LAB — SARS CORONAVIRUS 2 BY RT PCR (HOSPITAL ORDER, PERFORMED IN ~~LOC~~ HOSPITAL LAB): SARS Coronavirus 2: NEGATIVE

## 2020-05-22 LAB — MAGNESIUM: Magnesium: 1.4 mg/dL — ABNORMAL LOW (ref 1.7–2.4)

## 2020-05-22 MED ORDER — FAMOTIDINE 20 MG PO TABS
40.0000 mg | ORAL_TABLET | Freq: Every day | ORAL | Status: DC
  Administered 2020-05-23 – 2020-05-28 (×6): 40 mg via ORAL
  Filled 2020-05-22 (×6): qty 2

## 2020-05-22 MED ORDER — ISOSORBIDE MONONITRATE ER 60 MG PO TB24
60.0000 mg | ORAL_TABLET | Freq: Every morning | ORAL | Status: DC
  Administered 2020-05-23 – 2020-05-28 (×6): 60 mg via ORAL
  Filled 2020-05-22 (×6): qty 1

## 2020-05-22 MED ORDER — METOPROLOL SUCCINATE ER 100 MG PO TB24
100.0000 mg | ORAL_TABLET | Freq: Every day | ORAL | Status: DC
  Administered 2020-05-23 – 2020-05-28 (×6): 100 mg via ORAL
  Filled 2020-05-22: qty 1
  Filled 2020-05-22 (×3): qty 2
  Filled 2020-05-22: qty 1
  Filled 2020-05-22: qty 2

## 2020-05-22 MED ORDER — PNEUMOCOCCAL VAC POLYVALENT 25 MCG/0.5ML IJ INJ
0.5000 mL | INJECTION | INTRAMUSCULAR | Status: AC
  Administered 2020-05-28: 0.5 mL via INTRAMUSCULAR
  Filled 2020-05-22: qty 0.5

## 2020-05-22 MED ORDER — ACETAMINOPHEN 325 MG PO TABS
650.0000 mg | ORAL_TABLET | ORAL | Status: DC | PRN
  Administered 2020-05-26: 650 mg via ORAL
  Filled 2020-05-22 (×2): qty 2

## 2020-05-22 MED ORDER — ONDANSETRON HCL 4 MG/2ML IJ SOLN: 4.0000 mg | Freq: Four times a day (QID) | INTRAMUSCULAR | Status: DC | PRN

## 2020-05-22 MED ORDER — OXYBUTYNIN CHLORIDE 5 MG PO TABS
5.0000 mg | ORAL_TABLET | Freq: Two times a day (BID) | ORAL | Status: DC
  Administered 2020-05-22 – 2020-05-28 (×12): 5 mg via ORAL
  Filled 2020-05-22 (×12): qty 1

## 2020-05-22 MED ORDER — ALLOPURINOL 100 MG PO TABS
100.0000 mg | ORAL_TABLET | Freq: Every day | ORAL | Status: DC
  Administered 2020-05-23 – 2020-05-28 (×6): 100 mg via ORAL
  Filled 2020-05-22 (×6): qty 1

## 2020-05-22 MED ORDER — ASPIRIN EC 81 MG PO TBEC
81.0000 mg | DELAYED_RELEASE_TABLET | Freq: Every day | ORAL | Status: DC
  Administered 2020-05-23 – 2020-05-28 (×7): 81 mg via ORAL
  Filled 2020-05-22 (×7): qty 1

## 2020-05-22 MED ORDER — FLUTICASONE PROPIONATE 50 MCG/ACT NA SUSP
2.0000 | Freq: Every evening | NASAL | Status: DC | PRN
  Filled 2020-05-22: qty 16

## 2020-05-22 MED ORDER — ROSUVASTATIN CALCIUM 5 MG PO TABS
10.0000 mg | ORAL_TABLET | Freq: Every day | ORAL | Status: DC
  Administered 2020-05-23 – 2020-05-28 (×6): 10 mg via ORAL
  Filled 2020-05-22 (×7): qty 2

## 2020-05-22 MED ORDER — FUROSEMIDE 10 MG/ML IJ SOLN
40.0000 mg | Freq: Two times a day (BID) | INTRAMUSCULAR | Status: DC
  Administered 2020-05-22 – 2020-05-23 (×2): 40 mg via INTRAVENOUS
  Filled 2020-05-22 (×2): qty 4

## 2020-05-22 MED ORDER — ALBUTEROL SULFATE (2.5 MG/3ML) 0.083% IN NEBU: 3.0000 mL | INHALATION_SOLUTION | Freq: Four times a day (QID) | RESPIRATORY_TRACT | Status: DC | PRN

## 2020-05-22 MED ORDER — MAGNESIUM SULFATE 2 GM/50ML IV SOLN
2.0000 g | Freq: Once | INTRAVENOUS | Status: AC
  Administered 2020-05-22: 2 g via INTRAVENOUS
  Filled 2020-05-22: qty 50

## 2020-05-22 MED ORDER — SODIUM CHLORIDE 0.9% FLUSH: 3.0000 mL | INTRAVENOUS | Status: DC | PRN

## 2020-05-22 MED ORDER — HYDRALAZINE HCL 50 MG PO TABS
50.0000 mg | ORAL_TABLET | Freq: Three times a day (TID) | ORAL | Status: DC
  Administered 2020-05-22 – 2020-05-28 (×17): 50 mg via ORAL
  Filled 2020-05-22 (×16): qty 1

## 2020-05-22 MED ORDER — LOSARTAN POTASSIUM 50 MG PO TABS: 100.0000 mg | ORAL_TABLET | Freq: Every day | ORAL | Status: DC

## 2020-05-22 MED ORDER — NITROGLYCERIN 0.4 MG SL SUBL: 0.4000 mg | SUBLINGUAL_TABLET | SUBLINGUAL | Status: DC | PRN

## 2020-05-22 MED ORDER — SODIUM CHLORIDE 0.9 % IV SOLN: 250.0000 mL | INTRAVENOUS | Status: DC | PRN

## 2020-05-22 MED ORDER — OXYCODONE-ACETAMINOPHEN 5-325 MG PO TABS
1.0000 | ORAL_TABLET | Freq: Two times a day (BID) | ORAL | Status: DC | PRN
  Administered 2020-05-22 – 2020-05-28 (×8): 1 via ORAL
  Filled 2020-05-22 (×8): qty 1

## 2020-05-22 MED ORDER — APIXABAN 5 MG PO TABS
5.0000 mg | ORAL_TABLET | Freq: Two times a day (BID) | ORAL | Status: DC
  Administered 2020-05-22 – 2020-05-25 (×6): 5 mg via ORAL
  Filled 2020-05-22 (×6): qty 1

## 2020-05-22 MED ORDER — POTASSIUM CHLORIDE CRYS ER 20 MEQ PO TBCR
40.0000 meq | EXTENDED_RELEASE_TABLET | Freq: Two times a day (BID) | ORAL | Status: DC
  Administered 2020-05-22 – 2020-05-23 (×3): 40 meq via ORAL
  Filled 2020-05-22 (×4): qty 2

## 2020-05-22 MED ORDER — AMLODIPINE BESYLATE 10 MG PO TABS
10.0000 mg | ORAL_TABLET | Freq: Every day | ORAL | Status: DC
  Administered 2020-05-23 – 2020-05-28 (×6): 10 mg via ORAL
  Filled 2020-05-22 (×6): qty 1

## 2020-05-22 MED ORDER — SODIUM CHLORIDE 0.9% FLUSH
3.0000 mL | Freq: Two times a day (BID) | INTRAVENOUS | Status: DC
  Administered 2020-05-22 – 2020-05-26 (×8): 3 mL via INTRAVENOUS

## 2020-05-22 MED ORDER — POTASSIUM CHLORIDE CRYS ER 20 MEQ PO TBCR: 20.0000 meq | EXTENDED_RELEASE_TABLET | Freq: Two times a day (BID) | ORAL | Status: DC

## 2020-05-22 MED ORDER — METOPROLOL SUCCINATE ER 50 MG PO TB24
50.0000 mg | ORAL_TABLET | Freq: Every day | ORAL | Status: DC
  Administered 2020-05-22 – 2020-05-27 (×6): 50 mg via ORAL
  Filled 2020-05-22 (×6): qty 1

## 2020-05-22 NOTE — H&P (Addendum)
Referring Physician:  [name] is an 79 y.o. female.                       Chief Complaint: Shortness of breath and palpitation  HPI: 79 years old black female with morbid obesity, CAD, HTN, Hyperlipidemia, S/P pacemaker for 2nd. Degree HB, stroke has gradually gained 10-15 pounds of weight with increasing shortness of breath and palpitations. She denies fever, cough or chest pain.   Past Medical History:  Diagnosis Date  . Arthritis   . CHF (congestive heart failure) ([place])    05/2011 echo severe concentric LVH, normal systolic fxn, grade 1 diastolic dysfxn, paradoxical septal motion  . Coronary artery disease    Cath 04/2006 normal coronaries, mild-mod slow flow in all coronaries with slow distall runoff, mild-mod LV enlargement, EF 55%   . Depression   . Dysrhythmia   . GERD (gastroesophageal reflux disease)   . Hypercholesterolemia   . Hyperlipemia   . Hypertension   . MI (myocardial infarction) ([place])    NOT SURE HOW MANY  . Morbid obesity ([place])   . Nocturia   . 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 Peripheral vascular disease ([place])   . Pneumonia NONE RECENT  . Sleep apnea    needs cpap but does not wear  . Stroke ([place])   . 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 Vertigo    takes meclizine daily      Past Surgical History:  Procedure Laterality Date  . ABDOMINAL HYSTERECTOMY    . CARDIAC CATHETERIZATION    . CATARACT EXTRACTION W/PHACO Left 08/29/2013   Procedure: CATARACT EXTRACTION PHACO AND INTRAOCULAR LENS PLACEMENT (IOC);  Surgeon: Adonis Brook, MD;  Location: [facility];  Service: Ophthalmology;  Laterality: Left;  . COLONOSCOPY WITH PROPOFOL N/A 01/07/2014   Procedure: COLONOSCOPY WITH PROPOFOL;  Surgeon: Juanita Craver, MD;  [facility];  Service: Endoscopy;  Laterality: N/A;  . DILATION AND CURETTAGE OF UTERUS    . INSERT / REPLACE / REMOVE PACEMAKER    . PACEMAKER GENERATOR CHANGE N/A  03/27/2014   Procedure: PACEMAKER GENERATOR CHANGE;  Surgeon: Sanda Klein, MD;  Location: [facility];  Service: Cardiovascular;  Laterality: N/A;  . PACEMAKER INSERTION    . PACEMAKER INSERTION    . TUBAL LIGATION    . VASCULAR SURGERY     vericose vein surgery    No family history on file. Social History:  reports that she has never smoked. She has never used smokeless tobacco. She reports that she does not drink alcohol and does not use drugs.  Allergies:  Allergies  Allergen Reactions  . Ampicillin Other (See Comments)    Increases heart rhythm     Medications Prior to Admission  Medication Sig Dispense Refill  . albuterol (VENTOLIN HFA) 108 (90 Base) MCG/ACT inhaler Inhale 2 puffs into the lungs every 6 (six) hours as needed for wheezing or shortness of breath.     . allopurinol (ZYLOPRIM) 100 MG tablet Take 100 mg by mouth daily.    [name] Kitchen amLODipine (NORVASC) 10 MG tablet Take 10 mg by mouth daily.    [name] Kitchen apixaban (ELIQUIS) 5 MG TABS tablet Take 5 mg by mouth 2 (two) times daily.    [name] Kitchen aspirin EC 81 MG tablet Take 81 mg by mouth daily.    . clotrimazole (LOTRIMIN) 1 % cream Apply 1 application topically  daily as needed (rash).     . famotidine (PEPCID) 40 MG tablet Take 40 mg by mouth daily.    . fluticasone (FLONASE) 50 MCG/ACT nasal spray Place 2 sprays into both nostrils at bedtime as needed for allergies or rhinitis.     . furosemide (LASIX) 40 MG tablet Take 20 mg by mouth 2 (two) times daily.     . hydrALAZINE (APRESOLINE) 50 MG tablet Take 50 mg by mouth 3 (three) times daily.    . isosorbide mononitrate (IMDUR) 60 MG 24 hr tablet Take 60 mg by mouth every morning.     [name] Kitchen LINZESS 145 MCG CAPS capsule Take 145 mcg by mouth every morning.    [name] Kitchen losartan (COZAAR) 100 MG tablet Take 100 mg by mouth daily.    [name] Kitchen lubiprostone (AMITIZA) 24 MCG capsule Take 24 mcg by mouth at bedtime.    . meclizine (ANTIVERT) 25 MG tablet Take 12.5-25 mg by mouth 3 (three) times daily as needed for  dizziness. For dizziness    . metoprolol succinate (TOPROL-XL) 100 MG 24 hr tablet Take 50-100 mg by mouth See admin instructions. Take one tablet (100 mg) by mouth every morning and 1/2 tablet (50 mg) at night    . nitroGLYCERIN (NITROSTAT) 0.4 MG SL tablet Place 0.4 mg under the tongue every 5 (five) minutes as needed for chest pain.     [name] Kitchen oxybutynin (DITROPAN) 5 MG tablet Take 5 mg by mouth 2 (two) times daily.    [name] Kitchen oxyCODONE-acetaminophen (PERCOCET/ROXICET) 5-325 MG tablet Take 1 tablet by mouth 2 (two) times daily as needed (pain).     . pantoprazole (PROTONIX) 40 MG tablet Take 40 mg by mouth 2 (two) times daily.     . potassium chloride SA (K-DUR,KLOR-CON) 20 MEQ tablet Take 20 mEq by mouth 2 (two) times daily.     . rosuvastatin (CRESTOR) 10 MG tablet Take 10 mg by mouth daily.     . Vitamin D, Ergocalciferol, (DRISDOL) 1.25 MG (50000 UNIT) CAPS capsule Take 50,000 Units by mouth every Monday.      No results found for this or any previous visit (from the past 48 hour(s)). No results found.  Review Of Systems Constitutional: No fever, chills, Chronic weight gain. Eyes: No vision change, wears glasses. No discharge or pain. Ears: No hearing loss, No tinnitus. Respiratory: No asthma, COPD, pneumonias. Positive shortness of breath. No hemoptysis. Cardiovascular: Positive chest pain, palpitation, leg edema. Gastrointestinal: No nausea, vomiting, diarrhea, constipation. No GI bleed. No hepatitis. Genitourinary: No dysuria, hematuria, kidney stone. No incontinance. Neurological: Positive headache, stroke, no seizures.  Psychiatry: No psych facility admission for anxiety, depression, suicide. No detox. Skin: No rash. Musculoskeletal: Positive joint pain, fibromyalgia, neck pain, back pain. Lymphadenopathy: No lymphadenopathy. Hematology: No anemia or easy bruising.   Blood pressure (!) 114/91, pulse 67, temperature 99.1 F (37.3 C), temperature source Oral, SpO2 97 %. There is no  height or weight on file to calculate BMI. General appearance: alert, cooperative, appears stated age and positive respiratory distress Head: Normocephalic, atraumatic. Eyes: Brown eyes, wears glasses, pink conjunctiva, corneas clear. PERRL, EOM's intact. Neck: No adenopathy, no carotid bruit, Positive JVD, supple, symmetrical, trachea midline and thyroid not enlarged. Resp: Fine basal crackles to auscultation bilaterally. Cardio: Regular rate and rhythm, S1, S2 normal, II/VI systolic murmur, no click, rub or gallop GI: Soft, non-tender; bowel sounds normal; no organomegaly. Extremities: 4 + edema, no cyanosis or clubbing. Skin: Warm and dry.  Neurologic: Alert and oriented X  3, normal strength. Normal coordination and slow gait with walker use.  Assessment/Plan Shortness of breath r/o CHF Palpitations Bilateral leg edema/Venous stasis/Lymphedema HTN Hyperlipidemia Morbid obesity S/P stroke CAD S/P pacemaker for 2nd degree HB  Admit CXR, BNP, Echocardiogram, home medications, IV lasix.  Time spent: Review of old records, Lab, x-rays, EKG, other cardiac tests, examination, discussion with patient over 70 minutes.  Birdie Riddle, MD  05/22/2020, 4:04 PM

## 2020-05-23 ENCOUNTER — Inpatient Hospital Stay (HOSPITAL_COMMUNITY): Payer: Medicare Other

## 2020-05-23 ENCOUNTER — Other Ambulatory Visit: Payer: Self-pay

## 2020-05-23 LAB — BASIC METABOLIC PANEL
Anion gap: 14 (ref 5–15)
BUN: 25 mg/dL — ABNORMAL HIGH (ref 8–23)
CO2: 24 mmol/L (ref 22–32)
Calcium: 8.3 mg/dL — ABNORMAL LOW (ref 8.9–10.3)
Chloride: 104 mmol/L (ref 98–111)
Creatinine, Ser: 1.82 mg/dL — ABNORMAL HIGH (ref 0.44–1.00)
GFR calc Af Amer: 31 mL/min — ABNORMAL LOW (ref >60)
GFR calc non Af Amer: 27 mL/min — ABNORMAL LOW (ref >60)
Glucose, Bld: 119 mg/dL — ABNORMAL HIGH (ref 70–99)
Potassium: 2.7 mmol/L — CL (ref 3.5–5.1)
Sodium: 142 mmol/L (ref 135–145)

## 2020-05-23 LAB — ECHOCARDIOGRAM COMPLETE
Area-P 1/2: 2.29 cm2
S' Lateral: 3.1 cm
Weight: 5640 oz

## 2020-05-23 LAB — MAGNESIUM: Magnesium: 1.8 mg/dL (ref 1.7–2.4)

## 2020-05-23 LAB — LIPID PANEL
Cholesterol: 114 mg/dL (ref 0–200)
HDL: 42 mg/dL (ref >40)
LDL Cholesterol: 55 mg/dL (ref 0–99)
Total CHOL/HDL Ratio: 2.7 RATIO
Triglycerides: 85 mg/dL (ref <150)
VLDL: 17 mg/dL (ref 0–40)

## 2020-05-23 MED ORDER — POTASSIUM CHLORIDE CRYS ER 20 MEQ PO TBCR
40.0000 meq | EXTENDED_RELEASE_TABLET | ORAL | Status: AC
  Administered 2020-05-23 (×2): 40 meq via ORAL
  Filled 2020-05-23 (×2): qty 2

## 2020-05-23 MED ORDER — TECHNETIUM TC 99M TETROFOSMIN IV KIT
31.6000 | PACK | Freq: Once | INTRAVENOUS | Status: AC | PRN
  Administered 2020-05-23: 31.6 via INTRAVENOUS

## 2020-05-23 NOTE — Progress Notes (Signed)
CRITICAL VALUE ALERT  Critical Value:  K- 2.7  Date & Time Notied:  05/23/20  03:20  Provider Notified: Dr Doylene Canard  Orders Received/Actions taken: Yes, additional [place]

## 2020-05-23 NOTE — Social Work (Signed)
CSW acknowledging consult for PT/OT evaluation. TOC team will follow for discharge planning needs.  Criss Alvine, [place] Social Worker

## 2020-05-23 NOTE — Progress Notes (Signed)
Ref: System, Pcp Not In   Subjective:  Good diuresis with lasix use. Patient feels less short of breath. Needs potassium supplement for hypokalemia. Her magnesium level is corrected with IV magnesium. She has CKD, IIIb.  She is scheduled for 2 day protocol of NM myocardial perfusion stress test.  CXR suggestive of right apical low density with CT scan recommendation.  Objective:  Vital Signs in the last 24 hours: Temp:  [97.7 F (36.5 C)-98.5 F (36.9 C)] 98.3 F (36.8 C) (07/30 1600) Pulse Rate:  [59-61] 60 (07/30 1600) Cardiac Rhythm: Atrial paced (07/30 0755) Resp:  [17-20] 20 (07/30 1600) BP: (111-135)/(60-70) 135/70 (07/30 1600) SpO2:  [94 %-97 %] 97 % (07/30 1600) Weight:  [143.8 kg-159.9 kg] 159.9 kg (07/30 0413)  Physical Exam: BP Readings from Last 1 Encounters:  05/23/20 (!) 135/70     Wt Readings from Last 1 Encounters:  05/23/20 (!) 159.9 kg    Weight change:  Body mass index is 56.89 kg/m. HEENT: [place]/AT, Eyes-Brown, PERL, EOMI, Conjunctiva-Pink, Sclera-Non-icteric Neck: No JVD, No bruit, Trachea midline. Lungs:  Clearing, Bilateral. Cardiac:  Regular rhythm, normal S1 and S2, no S3. II/VI systolic murmur. Abdomen:  Soft, non-tender. BS present. Extremities:  2 + edema present. No cyanosis. No clubbing. CNS: AxOx3, Cranial nerves grossly intact, moves all 4 extremities.  Skin: Warm and dry.   Intake/Output from previous day: 07/29 0701 - 07/30 0700 In: 576 [P.O.:576] Out: 2400 [Urine:2400]    Lab Results: BMET    Component Value Date/Time   NA 142 05/23/2020 0114   NA 144 05/22/2020 1615   NA 144 12/02/2019 1145   K 2.7 (LL) 05/23/2020 0114   K 3.1 (L) 05/22/2020 1615   K 3.7 12/02/2019 1145   CL 104 05/23/2020 0114   CL 105 05/22/2020 1615   CL 111 12/02/2019 1145   CO2 24 05/23/2020 0114   CO2 28 05/22/2020 1615   CO2 22 12/02/2019 1145   GLUCOSE 119 (H) 05/23/2020 0114   GLUCOSE 116 (H) 05/22/2020 1615   GLUCOSE 115 (H) 12/02/2019 1145    BUN 25 (H) 05/23/2020 0114   BUN 25 (H) 05/22/2020 1615   BUN 16 12/02/2019 1145   CREATININE 1.82 (H) 05/23/2020 0114   CREATININE 1.91 (H) 05/22/2020 1615   CREATININE 1.54 (H) 12/02/2019 1145   CALCIUM 8.3 (L) 05/23/2020 0114   CALCIUM 8.6 (L) 05/22/2020 1615   CALCIUM 9.4 12/02/2019 1145   GFRNONAA 27 (L) 05/23/2020 0114   GFRNONAA 25 (L) 05/22/2020 1615   GFRNONAA 32 (L) 12/02/2019 1145   GFRAA 31 (L) 05/23/2020 0114   GFRAA 29 (L) 05/22/2020 1615   GFRAA 38 (L) 12/02/2019 1145   CBC    Component Value Date/Time   WBC 5.5 05/22/2020 1615   RBC 4.05 05/22/2020 1615   HGB 12.9 05/22/2020 1615   HCT 38.9 05/22/2020 1615   PLT 189 05/22/2020 1615   MCV 96.0 05/22/2020 1615   MCH 31.9 05/22/2020 1615   MCHC 33.2 05/22/2020 1615   RDW 15.2 05/22/2020 1615   LYMPHSABS 1.5 05/22/2020 1615   MONOABS 0.8 05/22/2020 1615   EOSABS 0.2 05/22/2020 1615   BASOSABS 0.1 05/22/2020 1615   HEPATIC Function Panel Recent Labs    06/18/19 1812 12/02/19 1145 05/22/20 1615  PROT 7.2 7.1 7.0   HEMOGLOBIN A1C No components found for: HGA1C,  MPG CARDIAC ENZYMES Lab Results  Component Value Date   CKTOTAL 63 09/10/2011   CKMB 1.7 09/10/2011   TROPONINI <  0.03 06/16/2015   TROPONINI <0.30 02/07/2013   TROPONINI <0.30 10/17/2012   BNP No results for input(s): PROBNP in the last 8760 hours. TSH Recent Labs    06/18/19 1812  TSH 2.486   CHOLESTEROL Recent Labs    06/20/19 0509 05/23/20 0114  CHOL 152 114    Scheduled Meds: . allopurinol  100 mg Oral Daily  . amLODipine  10 mg Oral Daily  . apixaban  5 mg Oral BID  . aspirin EC  81 mg Oral Daily  . famotidine  40 mg Oral Daily  . hydrALAZINE  50 mg Oral TID  . isosorbide mononitrate  60 mg Oral q morning - 10a  . metoprolol succinate  100 mg Oral Daily  . metoprolol succinate  50 mg Oral QHS  . oxybutynin  5 mg Oral BID  . pneumococcal 23 valent vaccine  0.5 mL Intramuscular Tomorrow-1000  . potassium chloride SA   40 mEq Oral BID  . rosuvastatin  10 mg Oral Daily  . sodium chloride flush  3 mL Intravenous Q12H   Continuous Infusions: . sodium chloride     PRN Meds:.sodium chloride, acetaminophen, albuterol, fluticasone, nitroGLYCERIN, ondansetron (ZOFRAN) IV, oxyCODONE-acetaminophen, sodium chloride flush  Assessment/Plan: Shortness of breath Palpitations Bilateral leg edema Hypertension Hyperlipidemia Morbid obesity S/P stroke CAD S/P pacemaker placement Right lung opacity r/o cancer CKD, IV Hypokalemia Hypomagnesemia, corrected  CT of chest. Potassium supplementation Hold IV lasix today. Nuclear stress test.   LOS: 1 day   Time spent including chart review, lab review, examination, discussion with patient : 30 min   Dixie Dials  MD  05/23/2020, 4:49 PM

## 2020-05-23 NOTE — Progress Notes (Signed)
  Echocardiogram 2D Echocardiogram has been performed.  Randa Lynn Gary Gabrielsen 05/23/2020, 10:46 AM

## 2020-05-24 LAB — BASIC METABOLIC PANEL
Anion gap: 10 (ref 5–15)
BUN: 26 mg/dL — ABNORMAL HIGH (ref 8–23)
CO2: 25 mmol/L (ref 22–32)
Calcium: 8.2 mg/dL — ABNORMAL LOW (ref 8.9–10.3)
Chloride: 105 mmol/L (ref 98–111)
Creatinine, Ser: 1.61 mg/dL — ABNORMAL HIGH (ref 0.44–1.00)
GFR calc Af Amer: 36 mL/min — ABNORMAL LOW (ref >60)
GFR calc non Af Amer: 31 mL/min — ABNORMAL LOW (ref >60)
Glucose, Bld: 106 mg/dL — ABNORMAL HIGH (ref 70–99)
Potassium: 3 mmol/L — ABNORMAL LOW (ref 3.5–5.1)
Sodium: 140 mmol/L (ref 135–145)

## 2020-05-24 MED ORDER — POLYETHYLENE GLYCOL 3350 17 G PO PACK
17.0000 g | PACK | Freq: Every day | ORAL | Status: DC
  Administered 2020-05-24 – 2020-05-28 (×5): 17 g via ORAL
  Filled 2020-05-24 (×4): qty 1

## 2020-05-24 MED ORDER — POTASSIUM CHLORIDE CRYS ER 20 MEQ PO TBCR
20.0000 meq | EXTENDED_RELEASE_TABLET | Freq: Two times a day (BID) | ORAL | Status: DC
  Administered 2020-05-24 – 2020-05-28 (×9): 20 meq via ORAL
  Filled 2020-05-24 (×8): qty 1

## 2020-05-24 MED ORDER — REGADENOSON 0.4 MG/5ML IV SOLN
0.4000 mg | Freq: Once | INTRAVENOUS | Status: AC
  Filled 2020-05-24: qty 5

## 2020-05-24 MED ORDER — REGADENOSON 0.4 MG/5ML IV SOLN
INTRAVENOUS | Status: AC
  Administered 2020-05-24: 0.4 mg via INTRAVENOUS
  Filled 2020-05-24: qty 5

## 2020-05-24 NOTE — Progress Notes (Signed)
Stress test positive for ischemia Dr.Kadakia reported make patient NPO after mid night on Sunday 08/01 for heart cath on Monday 08/02-patient informed is ok with treatment plan.

## 2020-05-24 NOTE — Discharge Instructions (Signed)

## 2020-05-24 NOTE — Progress Notes (Signed)
Subjective:  Patient denies any chest pain but complains of shortness of breath.  CT of the chest still pending.  Tolerated stress portion of the nuclear stress test.  Objective:  Vital Signs in the last 24 hours: Temp:  [97.5 F (36.4 C)-98.7 F (37.1 C)] 98.4 F (36.9 C) (07/31 0815) Pulse Rate:  [59-60] 59 (07/31 0815) Resp:  [15-20] 19 (07/31 0815) BP: (121-135)/(59-70) 123/64 (07/31 0945) SpO2:  [94 %-100 %] 94 % (07/31 0815) Weight:  [161.1 kg] 161.1 kg (07/31 0311)  Intake/Output from previous day: 07/30 0701 - 07/31 0700 In: 1200 [P.O.:1200] Out: 1100 [Urine:1100] Intake/Output from this shift: Total I/O In: -  Out: 200 [Urine:200]  Physical Exam: Neck: no adenopathy, no carotid bruit, no JVD and supple, symmetrical, trachea midline Lungs: Decreased breath sound at bases Heart: Paced rhythm on the monitor W4-R1 soft soft systolic murmur noted Abdomen: soft, non-tender; bowel sounds normal; no masses,  no organomegaly Extremities: No clubbing cyanosis 1+ edema with chronic dermatosis noted  Lab Results: Recent Labs    05/22/20 1615  WBC 5.5  HGB 12.9  PLT 189   Recent Labs    05/23/20 0114 05/24/20 0358  NA 142 140  K 2.7* 3.0*  CL 104 105  CO2 24 25  GLUCOSE 119* 106*  BUN 25* 26*  CREATININE 1.82* 1.61*   No results for input(s): TROPONINI in the last 72 hours.  Invalid input(s): CK, MB Hepatic Function Panel Recent Labs    05/22/20 1615  PROT 7.0  ALBUMIN 3.5  AST 19  ALT 12  ALKPHOS 78  BILITOT 1.0   Recent Labs    05/23/20 0114  CHOL 114   No results for input(s): PROTIME in the last 72 hours.  Imaging: Imaging results have been reviewed and DG Chest 2 View  Result Date: 05/22/2020 CLINICAL DATA:  Shortness of breath. EXAM: CHEST - 2 VIEW COMPARISON:  December 01, 2019. FINDINGS: Stable cardiomegaly. Right-sided pacemaker is unchanged. No pneumothorax or pleural effusion is noted. Left lung is clear. Right apical low density is  noted which may represent pneumonia, but neoplasm cannot be excluded. Bony thorax is unremarkable. IMPRESSION: Right apical low density is noted which may represent pneumonia, but neoplasm cannot be excluded. CT scan of the chest is recommended for further evaluation. Electronically Signed   By: Marijo Conception M.D.   On: 05/22/2020 18:50   ECHOCARDIOGRAM COMPLETE  Result Date: 05/23/2020    ECHOCARDIOGRAM REPORT   Patient Name:   [name] Date of Exam: 05/23/2020 Medical Rec #:  [id number]             Height:       66.0 in Accession #:    [id number]            Weight:       352.5 lb Date of Birth:  December 28, 1940            BSA:          2.544 m Patient Age:    79 years              BP:           124/66 mmHg Patient Gender: F                     HR:           60 bpm. Exam Location:  Inpatient Procedure: 2D Echo, Cardiac Doppler and Color Doppler Indications:  I50.33 Acute on chronic diastolic (congestive) heart failure  History:         Patient has prior history of Echocardiogram examinations, most                  recent 06/19/2019. Previous Myocardial Infarction and CAD,                  Pacemaker, TIA and Stroke; Signs/Symptoms:Dyspnea.  Sonographer:     Jonelle Sidle Dance Referring Phys:  [id number] Dixie Dials Diagnosing Phys: Dixie Dials MD  Sonographer Comments: No subcostal window and patient is morbidly obese. IMPRESSIONS  1. Left ventricular ejection fraction, by estimation, is 55 to 60%. The left ventricle has low normal function. The left ventricle demonstrates regional wall motion abnormalities (see scoring diagram/findings for description). There is moderate asymmetric left ventricular hypertrophy of the septal segment. Left ventricular diastolic parameters are consistent with Grade I diastolic dysfunction (impaired relaxation). There is mild hypokinesis of the left ventricular, basal-mid inferoseptal wall.  2. Right ventricular systolic function is normal. The right ventricular size is normal.  There is normal pulmonary artery systolic pressure.  3. Left atrial size was moderately dilated.  4. Right atrial size was mildly dilated.  5. The mitral valve is normal in structure. Mild mitral valve regurgitation.  6. The aortic valve is tricuspid. Aortic valve regurgitation is trivial. Mild aortic valve sclerosis is present, with no evidence of aortic valve stenosis.  7. There is borderline dilatation of the ascending aorta. There is mild (Grade II) atheroma plaque involving the aortic root and ascending aorta.  8. The inferior vena cava is normal in size with <50% respiratory variability, suggesting right atrial pressure of 8 mmHg. FINDINGS  Left Ventricle: Left ventricular ejection fraction, by estimation, is 55 to 60%. The left ventricle has low normal function. The left ventricle demonstrates regional wall motion abnormalities. Mild hypokinesis of the left ventricular, basal-mid inferoseptal wall. The left ventricular internal cavity size was normal in size. There is moderate asymmetric left ventricular hypertrophy of the septal segment. Left ventricular diastolic parameters are consistent with Grade I diastolic dysfunction (impaired relaxation).  LV Wall Scoring: The inferior wall, mid inferoseptal segment, and basal inferoseptal segment are hypokinetic. The entire anterior wall, entire lateral wall, entire anterior septum, and entire apex are normal. Right Ventricle: The right ventricular size is normal. No increase in right ventricular wall thickness. Right ventricular systolic function is normal. There is normal pulmonary artery systolic pressure. The tricuspid regurgitant velocity is 2.45 m/s, and  with an assumed right atrial pressure of 8 mmHg, the estimated right ventricular systolic pressure is 08.1 mmHg. Left Atrium: Left atrial size was moderately dilated. Right Atrium: Right atrial size was mildly dilated. Pericardium: There is no evidence of pericardial effusion. Mitral Valve: The mitral valve  is normal in structure. Mild mitral valve regurgitation. Tricuspid Valve: The tricuspid valve is normal in structure. Tricuspid valve regurgitation is mild. Aortic Valve: The aortic valve is tricuspid. . There is mild thickening and mild calcification of the aortic valve. Aortic valve regurgitation is trivial. Mild aortic valve sclerosis is present, with no evidence of aortic valve stenosis. There is mild thickening of the aortic valve. There is mild calcification of the aortic valve. Pulmonic Valve: The pulmonic valve was normal in structure. Pulmonic valve regurgitation is not visualized. Aorta: The aortic root is normal in size and structure. There is borderline dilatation of the ascending aorta. There is mild (Grade II) atheroma plaque involving the aortic root and ascending  aorta. Venous: The inferior vena cava is normal in size with less than 50% respiratory variability, suggesting right atrial pressure of 8 mmHg. IAS/Shunts: The interatrial septum was not assessed.  LEFT VENTRICLE PLAX 2D LVIDd:         4.70 cm  Diastology LVIDs:         3.10 cm  LV e' lateral:   5.66 cm/s LV PW:         1.30 cm  LV E/e' lateral: 7.9 LV IVS:        1.90 cm  LV e' medial:    4.24 cm/s LVOT diam:     2.10 cm  LV E/e' medial:  10.5 LV SV:         47 LV SV Index:   18 LVOT Area:     3.46 cm  RIGHT VENTRICLE RV S prime:     13.20 cm/s TAPSE (M-mode): 1.9 cm LEFT ATRIUM             Index       RIGHT ATRIUM           Index LA diam:        4.20 cm 1.65 cm/m  RA Area:     13.90 cm LA Vol (A2C):   77.2 ml 30.34 ml/m RA Volume:   28.10 ml  11.04 ml/m LA Vol (A4C):   43.4 ml 17.06 ml/m LA Biplane Vol: 59.5 ml 23.38 ml/m  AORTIC VALVE LVOT Vmax:   60.70 cm/s LVOT Vmean:  43.800 cm/s LVOT VTI:    0.135 m  AORTA Ao Root diam: 3.70 cm Ao Asc diam:  3.90 cm MITRAL VALVE               TRICUSPID VALVE MV Area (PHT): 2.29 cm    TR Peak grad:   24.0 mmHg MV Decel Time: 331 msec    TR Vmax:        245.00 cm/s MV E velocity: 44.60 cm/s MV A  velocity: 67.30 cm/s  SHUNTS MV E/A ratio:  0.66        Systemic VTI:  0.14 m                            Systemic Diam: 2.10 cm Dixie Dials MD Electronically signed by Dixie Dials MD Signature Date/Time: 05/23/2020/10:58:31 AM    Final     Cardiac Studies:  Assessment/Plan:  Exertional dyspnea questionable etiology Status post episodic palpitations Bilateral leg edema Hypertension Hyperlipidemia Morbid obesity Rule out obstructive sleep apnea S/P stroke CAD S/P pacemaker placement Right lung opacity r/o cancer CKD, IV Hypokalemia Hypomagnesemia, corrected  Plan Replace K Continue present management Increase ambulation as tolerated PT consult awaited Check labs in a.m. Check CT results  LOS: 2 days    Charolette Forward 05/24/2020, 9:51 AM

## 2020-05-24 NOTE — Progress Notes (Signed)
Paged MD for diet orders

## 2020-05-25 LAB — BASIC METABOLIC PANEL
Anion gap: 8 (ref 5–15)
BUN: 22 mg/dL (ref 8–23)
CO2: 28 mmol/L (ref 22–32)
Calcium: 8.5 mg/dL — ABNORMAL LOW (ref 8.9–10.3)
Chloride: 104 mmol/L (ref 98–111)
Creatinine, Ser: 1.6 mg/dL — ABNORMAL HIGH (ref 0.44–1.00)
GFR calc Af Amer: 36 mL/min — ABNORMAL LOW (ref >60)
GFR calc non Af Amer: 31 mL/min — ABNORMAL LOW (ref >60)
Glucose, Bld: 105 mg/dL — ABNORMAL HIGH (ref 70–99)
Potassium: 3.3 mmol/L — ABNORMAL LOW (ref 3.5–5.1)
Sodium: 140 mmol/L (ref 135–145)

## 2020-05-25 LAB — CBC
HCT: 36.6 % (ref 36.0–46.0)
Hemoglobin: 11.5 g/dL — ABNORMAL LOW (ref 12.0–15.0)
MCH: 30.7 pg (ref 26.0–34.0)
MCHC: 31.4 g/dL (ref 30.0–36.0)
MCV: 97.6 fL (ref 80.0–100.0)
Platelets: 159 10*3/uL (ref 150–400)
RBC: 3.75 MIL/uL — ABNORMAL LOW (ref 3.87–5.11)
RDW: 15.5 % (ref 11.5–15.5)
WBC: 5.3 10*3/uL (ref 4.0–10.5)
nRBC: 0 % (ref 0.0–0.2)

## 2020-05-25 MED ORDER — SODIUM CHLORIDE 0.9% FLUSH
3.0000 mL | Freq: Two times a day (BID) | INTRAVENOUS | Status: DC
  Administered 2020-05-25 – 2020-05-27 (×5): 3 mL via INTRAVENOUS

## 2020-05-25 MED ORDER — SODIUM CHLORIDE 0.9% FLUSH: 3.0000 mL | INTRAVENOUS | Status: DC | PRN

## 2020-05-25 MED ORDER — SODIUM CHLORIDE 0.9 % WEIGHT BASED INFUSION
1.0000 mL/kg/h | INTRAVENOUS | Status: DC
  Administered 2020-05-26 (×2): 1 mL/kg/h via INTRAVENOUS

## 2020-05-25 MED ORDER — SODIUM CHLORIDE 0.9 % IV SOLN: 250.0000 mL | INTRAVENOUS | Status: DC | PRN

## 2020-05-25 MED ORDER — ASPIRIN 81 MG PO CHEW
81.0000 mg | CHEWABLE_TABLET | ORAL | Status: AC
  Administered 2020-05-26: 81 mg via ORAL

## 2020-05-25 MED ORDER — SODIUM CHLORIDE 0.9 % WEIGHT BASED INFUSION
3.0000 mL/kg/h | INTRAVENOUS | Status: DC
  Administered 2020-05-26: 3 mL/kg/h via INTRAVENOUS

## 2020-05-25 NOTE — Plan of Care (Signed)

## 2020-05-25 NOTE — Progress Notes (Signed)
Subjective:  Patient denies any chest pain at present but does give history of chest pain with exertion associated with shortness of breath.  Nuclear stress test showed large area of moderate to severely reduced reversible perfusion defect involving apex and inferior wall of the left ventricle with EF of 69%.  Objective:  Vital Signs in the last 24 hours: Temp:  [98 F (36.7 C)-98.2 F (36.8 C)] 98.2 F (36.8 C) (08/01 0407) Pulse Rate:  [60-62] 60 (08/01 0407) Resp:  [20] 20 (08/01 0407) BP: (113-135)/(52-71) 113/52 (08/01 0407) SpO2:  [94 %-99 %] 94 % (08/01 0407) Weight:  [161.3 kg] 161.3 kg (08/01 0407)  Intake/Output from previous day: 07/31 0701 - 08/01 0700 In: 600 [P.O.:600] Out: 950 [Urine:950] Intake/Output from this shift: No intake/output data recorded.  Physical Exam: Neck: no adenopathy, no carotid bruit, no JVD and supple, symmetrical, trachea midline Lungs: clear to auscultation bilaterally Heart: regular rate and rhythm, S1, S2 normal and Soft systolic murmur noted Abdomen: soft, non-tender; bowel sounds normal; no masses,  no organomegaly Extremities: No clubbing cyanosis 1+ edema with chronic dermatosis noted  Lab Results: Recent Labs    05/22/20 1615 05/25/20 0634  WBC 5.5 5.3  HGB 12.9 11.5*  PLT 189 159   Recent Labs    05/24/20 0358 05/25/20 0634  NA 140 140  K 3.0* 3.3*  CL 105 104  CO2 25 28  GLUCOSE 106* 105*  BUN 26* 22  CREATININE 1.61* 1.60*   No results for input(s): TROPONINI in the last 72 hours.  Invalid input(s): CK, MB Hepatic Function Panel Recent Labs    05/22/20 1615  PROT 7.0  ALBUMIN 3.5  AST 19  ALT 12  ALKPHOS 78  BILITOT 1.0   Recent Labs    05/23/20 0114  CHOL 114   No results for input(s): PROTIME in the last 72 hours.  Imaging: Imaging results have been reviewed and NM Myocar Multi W/Spect W/Wall Motion / EF  Result Date: 05/24/2020 CLINICAL DATA:  Chest pain. EXAM: MYOCARDIAL IMAGING WITH SPECT  (REST AND PHARMACOLOGIC-STRESS - 2 DAY PROTOCOL) GATED LEFT VENTRICULAR WALL MOTION STUDY LEFT VENTRICULAR EJECTION FRACTION TECHNIQUE: Standard myocardial SPECT imaging was performed after resting intravenous injection of 30 mCi Tc-50m tetrofosmin. Subsequently, on a second day, intravenous infusion of Lexiscan was performed under the supervision of the Cardiology staff. At peak effect of the drug, 30 mCi Tc-51m tetrofosmin was injected intravenously and standard myocardial SPECT imaging was performed. Quantitative gated imaging was also performed to evaluate left ventricular wall motion, and estimate left ventricular ejection fraction. COMPARISON:  Chest radiograph-05/22/2020; chest CT-05/24/2019 FINDINGS: Rest images: Moderate amount of breast and chest wall attenuation is seen on both provided rest stress images. There is a minimal amount of GI attenuation and patient motion artifact on both provided rest stress images. Perfusion: There is a large territory of moderate to severely reduced perfusion involving the apex and inferior wall of the left ventricle on the provided stress images worrisome for a large of pharmacologically induced ischemia. No discrete matched areas non perfusion with regional wall motion abnormality to suggest previous infarction. Wall Motion: Normal left ventricular wall motion. No left ventricular dilation. Left Ventricular Ejection Fraction: 69 % End diastolic volume 505 ml End systolic volume 35 ml IMPRESSION: 1. Large territory of moderate to severely reduced reversible perfusion involving the apex and inferior wall of the left ventricle worrisome for a large area of pharmacologically induced ischemia. 2. No definitive scintigraphic evidence of prior infarction.  3. Normal wall motion. 4. Ejection fraction - 69%. These results will be called to the ordering clinician or representative by the Radiologist Assistant, and communication documented in the PACS or [employer].  Electronically Signed   By: Sandi Mariscal M.D.   On: 05/24/2020 12:40   ECHOCARDIOGRAM COMPLETE  Result Date: 05/23/2020    ECHOCARDIOGRAM REPORT   Patient Name:   [name] Date of Exam: 05/23/2020 Medical Rec #:  [id number]             Height:       66.0 in Accession #:    [id number]            Weight:       352.5 lb Date of Birth:  June 05, 1941            BSA:          2.544 m Patient Age:    79 years              BP:           124/66 mmHg Patient Gender: F                     HR:           60 bpm. Exam Location:  Inpatient Procedure: 2D Echo, Cardiac Doppler and Color Doppler Indications:     I50.33 Acute on chronic diastolic (congestive) heart failure  History:         Patient has prior history of Echocardiogram examinations, most                  recent 06/19/2019. Previous Myocardial Infarction and CAD,                  Pacemaker, TIA and Stroke; Signs/Symptoms:Dyspnea.  Sonographer:     Jonelle Sidle Dance Referring Phys:  [id number] Dixie Dials Diagnosing Phys: Dixie Dials MD  Sonographer Comments: No subcostal window and patient is morbidly obese. IMPRESSIONS  1. Left ventricular ejection fraction, by estimation, is 55 to 60%. The left ventricle has low normal function. The left ventricle demonstrates regional wall motion abnormalities (see scoring diagram/findings for description). There is moderate asymmetric left ventricular hypertrophy of the septal segment. Left ventricular diastolic parameters are consistent with Grade I diastolic dysfunction (impaired relaxation). There is mild hypokinesis of the left ventricular, basal-mid inferoseptal wall.  2. Right ventricular systolic function is normal. The right ventricular size is normal. There is normal pulmonary artery systolic pressure.  3. Left atrial size was moderately dilated.  4. Right atrial size was mildly dilated.  5. The mitral valve is normal in structure. Mild mitral valve regurgitation.  6. The aortic valve is tricuspid. Aortic valve  regurgitation is trivial. Mild aortic valve sclerosis is present, with no evidence of aortic valve stenosis.  7. There is borderline dilatation of the ascending aorta. There is mild (Grade II) atheroma plaque involving the aortic root and ascending aorta.  8. The inferior vena cava is normal in size with <50% respiratory variability, suggesting right atrial pressure of 8 mmHg. FINDINGS  Left Ventricle: Left ventricular ejection fraction, by estimation, is 55 to 60%. The left ventricle has low normal function. The left ventricle demonstrates regional wall motion abnormalities. Mild hypokinesis of the left ventricular, basal-mid inferoseptal wall. The left ventricular internal cavity size was normal in size. There is moderate asymmetric left ventricular hypertrophy of the septal segment. Left ventricular diastolic parameters are consistent with Grade I diastolic  dysfunction (impaired relaxation).  LV Wall Scoring: The inferior wall, mid inferoseptal segment, and basal inferoseptal segment are hypokinetic. The entire anterior wall, entire lateral wall, entire anterior septum, and entire apex are normal. Right Ventricle: The right ventricular size is normal. No increase in right ventricular wall thickness. Right ventricular systolic function is normal. There is normal pulmonary artery systolic pressure. The tricuspid regurgitant velocity is 2.45 m/s, and  with an assumed right atrial pressure of 8 mmHg, the estimated right ventricular systolic pressure is 20.2 mmHg. Left Atrium: Left atrial size was moderately dilated. Right Atrium: Right atrial size was mildly dilated. Pericardium: There is no evidence of pericardial effusion. Mitral Valve: The mitral valve is normal in structure. Mild mitral valve regurgitation. Tricuspid Valve: The tricuspid valve is normal in structure. Tricuspid valve regurgitation is mild. Aortic Valve: The aortic valve is tricuspid. . There is mild thickening and mild calcification of the aortic  valve. Aortic valve regurgitation is trivial. Mild aortic valve sclerosis is present, with no evidence of aortic valve stenosis. There is mild thickening of the aortic valve. There is mild calcification of the aortic valve. Pulmonic Valve: The pulmonic valve was normal in structure. Pulmonic valve regurgitation is not visualized. Aorta: The aortic root is normal in size and structure. There is borderline dilatation of the ascending aorta. There is mild (Grade II) atheroma plaque involving the aortic root and ascending aorta. Venous: The inferior vena cava is normal in size with less than 50% respiratory variability, suggesting right atrial pressure of 8 mmHg. IAS/Shunts: The interatrial septum was not assessed.  LEFT VENTRICLE PLAX 2D LVIDd:         4.70 cm  Diastology LVIDs:         3.10 cm  LV e' lateral:   5.66 cm/s LV PW:         1.30 cm  LV E/e' lateral: 7.9 LV IVS:        1.90 cm  LV e' medial:    4.24 cm/s LVOT diam:     2.10 cm  LV E/e' medial:  10.5 LV SV:         47 LV SV Index:   18 LVOT Area:     3.46 cm  RIGHT VENTRICLE RV S prime:     13.20 cm/s TAPSE (M-mode): 1.9 cm LEFT ATRIUM             Index       RIGHT ATRIUM           Index LA diam:        4.20 cm 1.65 cm/m  RA Area:     13.90 cm LA Vol (A2C):   77.2 ml 30.34 ml/m RA Volume:   28.10 ml  11.04 ml/m LA Vol (A4C):   43.4 ml 17.06 ml/m LA Biplane Vol: 59.5 ml 23.38 ml/m  AORTIC VALVE LVOT Vmax:   60.70 cm/s LVOT Vmean:  43.800 cm/s LVOT VTI:    0.135 m  AORTA Ao Root diam: 3.70 cm Ao Asc diam:  3.90 cm MITRAL VALVE               TRICUSPID VALVE MV Area (PHT): 2.29 cm    TR Peak grad:   24.0 mmHg MV Decel Time: 331 msec    TR Vmax:        245.00 cm/s MV E velocity: 44.60 cm/s MV A velocity: 67.30 cm/s  SHUNTS MV E/A ratio:  0.66        Systemic  VTI:  0.14 m                            Systemic Diam: 2.10 cm Dixie Dials MD Electronically signed by Dixie Dials MD Signature Date/Time: 05/23/2020/10:58:31 AM    Final     Cardiac  Studies:  Assessment/Plan:  Chest pain/exertional dyspnea probably anginal equivalent abnormal nuclear stress test Status post episodic palpitations Bilateral leg edema Hypertension Hyperlipidemia Morbid obesity Rule out obstructive sleep apnea S/P stroke CAD S/P pacemaker placement Right lung opacity r/o cancer CKD, IV Hypokalemia improved Hypomagnesemia, corrected  Plan Discussed with patient at length regarding nuclear stress test and left cardiac catheterization possible PTCA stenting its risk and benefits i.e. death MI stroke need for emergency CABG local vascular complications worsening renal function etc. and consents for PCI  LOS: 3 days    Charolette Forward 05/25/2020, 9:59 AM

## 2020-05-26 LAB — BASIC METABOLIC PANEL
Anion gap: 8 (ref 5–15)
BUN: 23 mg/dL (ref 8–23)
CO2: 26 mmol/L (ref 22–32)
Calcium: 8.8 mg/dL — ABNORMAL LOW (ref 8.9–10.3)
Chloride: 106 mmol/L (ref 98–111)
Creatinine, Ser: 1.45 mg/dL — ABNORMAL HIGH (ref 0.44–1.00)
GFR calc Af Amer: 40 mL/min — ABNORMAL LOW (ref >60)
GFR calc non Af Amer: 35 mL/min — ABNORMAL LOW (ref >60)
Glucose, Bld: 97 mg/dL (ref 70–99)
Potassium: 3.7 mmol/L (ref 3.5–5.1)
Sodium: 140 mmol/L (ref 135–145)

## 2020-05-26 MED ORDER — FUROSEMIDE 10 MG/ML IJ SOLN
20.0000 mg | Freq: Once | INTRAMUSCULAR | Status: AC
  Administered 2020-05-26: 20 mg via INTRAVENOUS
  Filled 2020-05-26: qty 2

## 2020-05-26 NOTE — Progress Notes (Signed)
Ref: System, Pcp Not In   Subjective:  Mild respiratory distress at rest.  VS stable. Potassium level has improved. She is aware of abnormal nuclear stress test. She is off Eliquis about 24 hours.  Objective:  Vital Signs in the last 24 hours: Temp:  [97.9 F (36.6 C)-98.4 F (36.9 C)] 98 F (36.7 C) (08/02 0444) Pulse Rate:  [59-60] 60 (08/02 0444) Cardiac Rhythm: A-V Sequential paced (08/02 0717) Resp:  [16-20] 20 (08/02 0444) BP: (124-132)/(65-80) 124/65 (08/02 0444) SpO2:  [94 %-95 %] 94 % (08/02 0444) Weight:  [162.8 kg] 162.8 kg (08/02 0444)  Physical Exam: BP Readings from Last 1 Encounters:  05/26/20 124/65     Wt Readings from Last 1 Encounters:  05/26/20 (!) 162.8 kg    Weight change: 1.497 kg Body mass index is 57.93 kg/m. HEENT: /AT, Eyes-Brown, PERL, EOMI, Conjunctiva-Pink, Sclera-Non-icteric Neck: No JVD, No bruit, Trachea midline. Lungs:  Clearing, Bilateral. Cardiac:  Regular rhythm, normal S1 and S2, no S3. II/VI systolic murmur. Abdomen:  Soft, non-tender. BS present. Extremities:  2 + edema present. No cyanosis. No clubbing. CNS: AxOx3, Cranial nerves grossly intact, moves all 4 extremities.  Skin: Warm and dry.   Intake/Output from previous day: 08/01 0701 - 08/02 0700 In: 1481 [P.O.:1020; I.V.:461] Out: 950 [Urine:950]    Lab Results: BMET    Component Value Date/Time   NA 140 05/26/2020 0527   NA 140 05/25/2020 0634   NA 140 05/24/2020 0358   K 3.7 05/26/2020 0527   K 3.3 (L) 05/25/2020 0634   K 3.0 (L) 05/24/2020 0358   CL 106 05/26/2020 0527   CL 104 05/25/2020 0634   CL 105 05/24/2020 0358   CO2 26 05/26/2020 0527   CO2 28 05/25/2020 0634   CO2 25 05/24/2020 0358   GLUCOSE 97 05/26/2020 0527   GLUCOSE 105 (H) 05/25/2020 0634   GLUCOSE 106 (H) 05/24/2020 0358   BUN 23 05/26/2020 0527   BUN 22 05/25/2020 0634   BUN 26 (H) 05/24/2020 0358   CREATININE 1.45 (H) 05/26/2020 0527   CREATININE 1.60 (H) 05/25/2020 0634    CREATININE 1.61 (H) 05/24/2020 0358   CALCIUM 8.8 (L) 05/26/2020 0527   CALCIUM 8.5 (L) 05/25/2020 0634   CALCIUM 8.2 (L) 05/24/2020 0358   GFRNONAA 35 (L) 05/26/2020 0527   GFRNONAA 31 (L) 05/25/2020 0634   GFRNONAA 31 (L) 05/24/2020 0358   GFRAA 40 (L) 05/26/2020 0527   GFRAA 36 (L) 05/25/2020 0634   GFRAA 36 (L) 05/24/2020 0358   CBC    Component Value Date/Time   WBC 5.3 05/25/2020 0634   RBC 3.75 (L) 05/25/2020 0634   HGB 11.5 (L) 05/25/2020 0634   HCT 36.6 05/25/2020 0634   PLT 159 05/25/2020 0634   MCV 97.6 05/25/2020 0634   MCH 30.7 05/25/2020 0634   MCHC 31.4 05/25/2020 0634   RDW 15.5 05/25/2020 0634   LYMPHSABS 1.5 05/22/2020 1615   MONOABS 0.8 05/22/2020 1615   EOSABS 0.2 05/22/2020 1615   BASOSABS 0.1 05/22/2020 1615   HEPATIC Function Panel Recent Labs    06/18/19 1812 12/02/19 1145 05/22/20 1615  PROT 7.2 7.1 7.0   HEMOGLOBIN A1C No components found for: HGA1C,  MPG CARDIAC ENZYMES Lab Results  Component Value Date   CKTOTAL 63 09/10/2011   CKMB 1.7 09/10/2011   TROPONINI <0.03 06/16/2015   TROPONINI <0.30 02/07/2013   TROPONINI <0.30 10/17/2012   BNP No results for input(s): PROBNP in the last 8760 hours.  TSH Recent Labs    06/18/19 1812  TSH 2.486   CHOLESTEROL Recent Labs    06/20/19 0509 05/23/20 0114  CHOL 152 114    Scheduled Meds: . allopurinol  100 mg Oral Daily  . amLODipine  10 mg Oral Daily  . aspirin EC  81 mg Oral Daily  . famotidine  40 mg Oral Daily  . hydrALAZINE  50 mg Oral TID  . isosorbide mononitrate  60 mg Oral q morning - 10a  . metoprolol succinate  100 mg Oral Daily  . metoprolol succinate  50 mg Oral QHS  . oxybutynin  5 mg Oral BID  . pneumococcal 23 valent vaccine  0.5 mL Intramuscular Tomorrow-1000  . polyethylene glycol  17 g Oral Daily  . potassium chloride SA  20 mEq Oral BID  . rosuvastatin  10 mg Oral Daily  . sodium chloride flush  3 mL Intravenous Q12H  . sodium chloride flush  3 mL  Intravenous Q12H   Continuous Infusions: . sodium chloride    . sodium chloride     PRN Meds:.sodium chloride, sodium chloride, acetaminophen, albuterol, fluticasone, nitroGLYCERIN, ondansetron (ZOFRAN) IV, oxyCODONE-acetaminophen, sodium chloride flush, sodium chloride flush  Assessment/Plan: Shortness of breath Palpitations Hypertension hyperlipidemia Hypokalemia resolved Hypomagnesemia, resolved CKD, IIIa CAD S/P stroke S/P permanent pacemaker placement Right lung opacity Morbid Obesity  Schedule cardiac cath tomorrow. Awaiting chest CT report.   LOS: 4 days   Time spent including chart review, lab review, examination, discussion with patient : 30 min   Dixie Dials  MD  05/26/2020, 9:39 AM

## 2020-05-26 NOTE — H&P (View-Only) (Signed)
Ref: System, Pcp Not In   Subjective:  Mild respiratory distress at rest.  VS stable. Potassium level has improved. She is aware of abnormal nuclear stress test. She is off Eliquis about 24 hours.  Objective:  Vital Signs in the last 24 hours: Temp:  [97.9 F (36.6 C)-98.4 F (36.9 C)] 98 F (36.7 C) (08/02 0444) Pulse Rate:  [59-60] 60 (08/02 0444) Cardiac Rhythm: A-V Sequential paced (08/02 0717) Resp:  [16-20] 20 (08/02 0444) BP: (124-132)/(65-80) 124/65 (08/02 0444) SpO2:  [94 %-95 %] 94 % (08/02 0444) Weight:  [162.8 kg] 162.8 kg (08/02 0444)  Physical Exam: BP Readings from Last 1 Encounters:  05/26/20 124/65     Wt Readings from Last 1 Encounters:  05/26/20 (!) 162.8 kg    Weight change: 1.497 kg Body mass index is 57.93 kg/m. HEENT: [place]/AT, Eyes-Brown, PERL, EOMI, Conjunctiva-Pink, Sclera-Non-icteric Neck: No JVD, No bruit, Trachea midline. Lungs:  Clearing, Bilateral. Cardiac:  Regular rhythm, normal S1 and S2, no S3. II/VI systolic murmur. Abdomen:  Soft, non-tender. BS present. Extremities:  2 + edema present. No cyanosis. No clubbing. CNS: AxOx3, Cranial nerves grossly intact, moves all 4 extremities.  Skin: Warm and dry.   Intake/Output from previous day: 08/01 0701 - 08/02 0700 In: 1481 [P.O.:1020; I.V.:461] Out: 950 [Urine:950]    Lab Results: BMET    Component Value Date/Time   NA 140 05/26/2020 0527   NA 140 05/25/2020 0634   NA 140 05/24/2020 0358   K 3.7 05/26/2020 0527   K 3.3 (L) 05/25/2020 0634   K 3.0 (L) 05/24/2020 0358   CL 106 05/26/2020 0527   CL 104 05/25/2020 0634   CL 105 05/24/2020 0358   CO2 26 05/26/2020 0527   CO2 28 05/25/2020 0634   CO2 25 05/24/2020 0358   GLUCOSE 97 05/26/2020 0527   GLUCOSE 105 (H) 05/25/2020 0634   GLUCOSE 106 (H) 05/24/2020 0358   BUN 23 05/26/2020 0527   BUN 22 05/25/2020 0634   BUN 26 (H) 05/24/2020 0358   CREATININE 1.45 (H) 05/26/2020 0527   CREATININE 1.60 (H) 05/25/2020 0634    CREATININE 1.61 (H) 05/24/2020 0358   CALCIUM 8.8 (L) 05/26/2020 0527   CALCIUM 8.5 (L) 05/25/2020 0634   CALCIUM 8.2 (L) 05/24/2020 0358   GFRNONAA 35 (L) 05/26/2020 0527   GFRNONAA 31 (L) 05/25/2020 0634   GFRNONAA 31 (L) 05/24/2020 0358   GFRAA 40 (L) 05/26/2020 0527   GFRAA 36 (L) 05/25/2020 0634   GFRAA 36 (L) 05/24/2020 0358   CBC    Component Value Date/Time   WBC 5.3 05/25/2020 0634   RBC 3.75 (L) 05/25/2020 0634   HGB 11.5 (L) 05/25/2020 0634   HCT 36.6 05/25/2020 0634   PLT 159 05/25/2020 0634   MCV 97.6 05/25/2020 0634   MCH 30.7 05/25/2020 0634   MCHC 31.4 05/25/2020 0634   RDW 15.5 05/25/2020 0634   LYMPHSABS 1.5 05/22/2020 1615   MONOABS 0.8 05/22/2020 1615   EOSABS 0.2 05/22/2020 1615   BASOSABS 0.1 05/22/2020 1615   HEPATIC Function Panel Recent Labs    06/18/19 1812 12/02/19 1145 05/22/20 1615  PROT 7.2 7.1 7.0   HEMOGLOBIN A1C No components found for: HGA1C,  MPG CARDIAC ENZYMES Lab Results  Component Value Date   CKTOTAL 63 09/10/2011   CKMB 1.7 09/10/2011   TROPONINI <0.03 06/16/2015   TROPONINI <0.30 02/07/2013   TROPONINI <0.30 10/17/2012   BNP No results for input(s): PROBNP in the last 8760 hours.  TSH Recent Labs    06/18/19 1812  TSH 2.486   CHOLESTEROL Recent Labs    06/20/19 0509 05/23/20 0114  CHOL 152 114    Scheduled Meds: . allopurinol  100 mg Oral Daily  . amLODipine  10 mg Oral Daily  . aspirin EC  81 mg Oral Daily  . famotidine  40 mg Oral Daily  . hydrALAZINE  50 mg Oral TID  . isosorbide mononitrate  60 mg Oral q morning - 10a  . metoprolol succinate  100 mg Oral Daily  . metoprolol succinate  50 mg Oral QHS  . oxybutynin  5 mg Oral BID  . pneumococcal 23 valent vaccine  0.5 mL Intramuscular Tomorrow-1000  . polyethylene glycol  17 g Oral Daily  . potassium chloride SA  20 mEq Oral BID  . rosuvastatin  10 mg Oral Daily  . sodium chloride flush  3 mL Intravenous Q12H  . sodium chloride flush  3 mL  Intravenous Q12H   Continuous Infusions: . sodium chloride    . sodium chloride     PRN Meds:.sodium chloride, sodium chloride, acetaminophen, albuterol, fluticasone, nitroGLYCERIN, ondansetron (ZOFRAN) IV, oxyCODONE-acetaminophen, sodium chloride flush, sodium chloride flush  Assessment/Plan: Shortness of breath Palpitations Hypertension hyperlipidemia Hypokalemia resolved Hypomagnesemia, resolved CKD, IIIa CAD S/P stroke S/P permanent pacemaker placement Right lung opacity Morbid Obesity  Schedule cardiac cath tomorrow. Awaiting chest CT report.   LOS: 4 days   Time spent including chart review, lab review, examination, discussion with patient : 30 min   Dixie Dials  MD  05/26/2020, 9:39 AM

## 2020-05-26 NOTE — Plan of Care (Signed)
  Problem: Education: Goal: Ability to demonstrate management of disease process will improve Outcome: Progressing Goal: Ability to verbalize understanding of medication therapies will improve Outcome: Progressing Goal: Individualized Educational Video(s) Outcome: Progressing   Problem: Activity: Goal: Capacity to carry out activities will improve Outcome: Progressing   Problem: Cardiac: Goal: Ability to achieve and maintain adequate cardiopulmonary perfusion will improve Outcome: Progressing   Problem: Education: Goal: Understanding of CV disease, CV risk reduction, and recovery process will improve Outcome: Progressing Goal: Individualized Educational Video(s) Outcome: Progressing   Problem: Activity: Goal: Ability to return to baseline activity level will improve Outcome: Progressing   Problem: Cardiovascular: Goal: Ability to achieve and maintain adequate cardiovascular perfusion will improve Outcome: Progressing Goal: Vascular access site(s) Level 0-1 will be maintained Outcome: Progressing   Problem: Health Behavior/Discharge Planning: Goal: Ability to safely manage health-related needs after discharge will improve Outcome: Progressing   

## 2020-05-27 ENCOUNTER — Encounter (HOSPITAL_COMMUNITY): Payer: Self-pay | Admitting: Cardiovascular Disease

## 2020-05-27 ENCOUNTER — Encounter (HOSPITAL_COMMUNITY)
Admission: AD | Disposition: A | Discharge status: Home Health | Payer: Self-pay | Source: Ambulatory Visit | Attending: Cardiovascular Disease

## 2020-05-27 HISTORY — PX: CORONARY ANGIOGRAPHY: CATH118303

## 2020-05-27 LAB — BASIC METABOLIC PANEL
Anion gap: 10 (ref 5–15)
BUN: 21 mg/dL (ref 8–23)
CO2: 25 mmol/L (ref 22–32)
Calcium: 8.7 mg/dL — ABNORMAL LOW (ref 8.9–10.3)
Chloride: 106 mmol/L (ref 98–111)
Creatinine, Ser: 1.29 mg/dL — ABNORMAL HIGH (ref 0.44–1.00)
GFR calc Af Amer: 47 mL/min — ABNORMAL LOW (ref >60)
GFR calc non Af Amer: 40 mL/min — ABNORMAL LOW (ref >60)
Glucose, Bld: 96 mg/dL (ref 70–99)
Potassium: 4.1 mmol/L (ref 3.5–5.1)
Sodium: 141 mmol/L (ref 135–145)

## 2020-05-27 SURGERY — CORONARY ANGIOGRAPHY (CATH LAB)
Anesthesia: LOCAL

## 2020-05-27 MED ORDER — MIDAZOLAM HCL 2 MG/2ML IJ SOLN
INTRAMUSCULAR | Status: AC
  Filled 2020-05-27: qty 2

## 2020-05-27 MED ORDER — FENTANYL CITRATE (PF) 100 MCG/2ML IJ SOLN
INTRAMUSCULAR | Status: AC
  Filled 2020-05-27: qty 2

## 2020-05-27 MED ORDER — LABETALOL HCL 5 MG/ML IV SOLN: 10.0000 mg | INTRAVENOUS | Status: AC | PRN

## 2020-05-27 MED ORDER — SODIUM CHLORIDE 0.9 % IV SOLN: 250.0000 mL | INTRAVENOUS | Status: DC | PRN

## 2020-05-27 MED ORDER — LIDOCAINE HCL (PF) 1 % IJ SOLN
INTRAMUSCULAR | Status: AC
  Filled 2020-05-27: qty 30

## 2020-05-27 MED ORDER — IOHEXOL 350 MG/ML SOLN
INTRAVENOUS | Status: DC | PRN
  Administered 2020-05-27: 115 mL

## 2020-05-27 MED ORDER — VERAPAMIL HCL 2.5 MG/ML IV SOLN
INTRAVENOUS | Status: DC | PRN
  Administered 2020-05-27: 10 mL via INTRA_ARTERIAL

## 2020-05-27 MED ORDER — SODIUM CHLORIDE 0.9% FLUSH: 3.0000 mL | INTRAVENOUS | Status: DC | PRN

## 2020-05-27 MED ORDER — FENTANYL CITRATE (PF) 100 MCG/2ML IJ SOLN
INTRAMUSCULAR | Status: DC | PRN
  Administered 2020-05-27 (×4): 25 ug via INTRAVENOUS

## 2020-05-27 MED ORDER — HEPARIN (PORCINE) IN NACL 1000-0.9 UT/500ML-% IV SOLN
INTRAVENOUS | Status: AC
  Filled 2020-05-27: qty 1000

## 2020-05-27 MED ORDER — SODIUM CHLORIDE 0.9 % IV SOLN: INTRAVENOUS | Status: AC

## 2020-05-27 MED ORDER — HEPARIN SODIUM (PORCINE) 1000 UNIT/ML IJ SOLN
INTRAMUSCULAR | Status: AC
  Filled 2020-05-27: qty 1

## 2020-05-27 MED ORDER — HYDRALAZINE HCL 20 MG/ML IJ SOLN: 10.0000 mg | INTRAMUSCULAR | Status: AC | PRN

## 2020-05-27 MED ORDER — SODIUM CHLORIDE 0.9% FLUSH
3.0000 mL | Freq: Two times a day (BID) | INTRAVENOUS | Status: DC
  Administered 2020-05-27: 3 mL via INTRAVENOUS

## 2020-05-27 MED ORDER — HEPARIN (PORCINE) IN NACL 1000-0.9 UT/500ML-% IV SOLN
INTRAVENOUS | Status: DC | PRN
  Administered 2020-05-27 (×2): 500 mL

## 2020-05-27 MED ORDER — HEPARIN SODIUM (PORCINE) 1000 UNIT/ML IJ SOLN
INTRAMUSCULAR | Status: DC | PRN
  Administered 2020-05-27: 3000 [IU] via INTRAVENOUS
  Administered 2020-05-27: 2000 [IU] via INTRAVENOUS
  Administered 2020-05-27: 3000 [IU] via INTRAVENOUS

## 2020-05-27 MED ORDER — MIDAZOLAM HCL 2 MG/2ML IJ SOLN
INTRAMUSCULAR | Status: DC | PRN
  Administered 2020-05-27 (×4): 1 mg via INTRAVENOUS

## 2020-05-27 MED ORDER — LIDOCAINE HCL (PF) 1 % IJ SOLN
INTRAMUSCULAR | Status: DC | PRN
  Administered 2020-05-27: 2 mL

## 2020-05-27 MED ORDER — VERAPAMIL HCL 2.5 MG/ML IV SOLN
INTRAVENOUS | Status: AC
  Filled 2020-05-27: qty 2

## 2020-05-27 SURGICAL SUPPLY — 18 items
CATH 5FR JL3.5 JR4 ANG PIG MP (CATHETERS) ×1 IMPLANT
CATH INFINITI 5F JL4 125CM (CATHETERS) ×1 IMPLANT
CATH INFINITI 5FR JR4 125CM (CATHETERS) ×1 IMPLANT
CATH LAUNCHER 5F EBU3.0 (CATHETERS) IMPLANT
CATH LAUNCHER 5F EBU3.5 (CATHETERS) ×1 IMPLANT
CATH LAUNCHER 5F JR4 (CATHETERS) ×1 IMPLANT
CATHETER LAUNCHER 5F EBU3.0 (CATHETERS) ×2
GLIDESHEATH SLEND SS 6F .021 (SHEATH) ×1 IMPLANT
GUIDEWIRE INQWIRE 1.5J.035X260 (WIRE) IMPLANT
GUIDEWIRE SAF TJ AMPL .035X180 (WIRE) ×1 IMPLANT
HOVERMATT SINGLE USE (MISCELLANEOUS) ×1 IMPLANT
INQWIRE 1.5J .035X260CM (WIRE) ×2
KIT ESSENTIALS PG (KITS) ×1 IMPLANT
KIT HEART LEFT (KITS) ×2 IMPLANT
PACK CARDIAC CATHETERIZATION (CUSTOM PROCEDURE TRAY) ×2 IMPLANT
SHEATH PROBE COVER 6X72 (BAG) ×1 IMPLANT
TRANSDUCER W/STOPCOCK (MISCELLANEOUS) ×2 IMPLANT
WIRE HI TORQ VERSACORE-J 145CM (WIRE) ×1 IMPLANT

## 2020-05-27 NOTE — Interval H&P Note (Signed)
History and Physical Interval Note:  05/27/2020 7:34 AM  [name]  has presented today for surgery, with the diagnosis of nstemi.  The various methods of treatment have been discussed with the patient and family. After consideration of risks, benefits and other options for treatment, the patient has consented to  Procedure(s): LEFT HEART CATH AND CORONARY ANGIOGRAPHY (N/A) as a surgical intervention.  The patient's history has been reviewed, patient examined, no change in status, stable for surgery.  I have reviewed the patient's chart and labs.  Questions were answered to the patient's satisfaction.     Birdie Riddle

## 2020-05-28 LAB — BASIC METABOLIC PANEL
Anion gap: 9 (ref 5–15)
BUN: 19 mg/dL (ref 8–23)
CO2: 24 mmol/L (ref 22–32)
Calcium: 8.7 mg/dL — ABNORMAL LOW (ref 8.9–10.3)
Chloride: 107 mmol/L (ref 98–111)
Creatinine, Ser: 1.39 mg/dL — ABNORMAL HIGH (ref 0.44–1.00)
GFR calc Af Amer: 43 mL/min — ABNORMAL LOW (ref >60)
GFR calc non Af Amer: 37 mL/min — ABNORMAL LOW (ref >60)
Glucose, Bld: 136 mg/dL — ABNORMAL HIGH (ref 70–99)
Potassium: 3.9 mmol/L (ref 3.5–5.1)
Sodium: 140 mmol/L (ref 135–145)

## 2020-05-28 LAB — CBC
HCT: 34.9 % — ABNORMAL LOW (ref 36.0–46.0)
Hemoglobin: 11.2 g/dL — ABNORMAL LOW (ref 12.0–15.0)
MCH: 31.6 pg (ref 26.0–34.0)
MCHC: 32.1 g/dL (ref 30.0–36.0)
MCV: 98.6 fL (ref 80.0–100.0)
Platelets: 171 10*3/uL (ref 150–400)
RBC: 3.54 MIL/uL — ABNORMAL LOW (ref 3.87–5.11)
RDW: 15.8 % — ABNORMAL HIGH (ref 11.5–15.5)
WBC: 4.7 10*3/uL (ref 4.0–10.5)
nRBC: 0 % (ref 0.0–0.2)

## 2020-05-28 NOTE — Care Management Important Message (Signed)
Important Message  Patient Details  Name: [name] MRN: [id number] Date of Birth: 10/18/1941   Medicare Important Message Given:  Yes     Shelda Altes 05/28/2020, 1:07 PM

## 2020-05-28 NOTE — Progress Notes (Signed)
D/C instructions given and reviewed. Questions answered. Tele and IV's removed. Tolerated well. Awaiting family to transport at approx 1600.

## 2020-05-28 NOTE — Discharge Summary (Signed)
Physician Discharge Summary  Patient ID: ZAHIRAH CHESLOCK MRN: [id number] DOB/AGE: 1941/01/12 79 y.o.  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 She denied fever, cough or chest pain. She responded to IV furosemide. Her hypokalemia was corrected with supplementation. Her creatinine was 1.91 mg.on admission to 1.39 mg. On day of discharge.  She underwent nuclear stress test that showed large apical and inferior wall reversible ischemia. Her cardiac cath showed normal dominant RCA and LCX and possible mid LAD mild disease. Her chest x-ray showed right apical density and CT chest report is pending. Home Health nurse for CHF follow up was arranged and she will see me in 2-4 weeks.  Consults: cardiology  Significant Diagnostic Studies: labs: CBC was normal. BMET showed potassium of 3.1 on admission to 3.9 on day of discharge Her lipid panel was excellent with LDL level of 55 mg. and triglycerides of 85 mg.  Hgb A1C was 6.0 %.  BNP was normal at 73.5 pg.  EKG: NSR, LBBB.  Chest x-ray: Right apical density. CT chest recommended. CT chest for cancer screening: Report pending.  NM myocardial perfusion stress test: Large  apical and Inferior wall reversible ischemia.  Cardiac cath by radial approach, easy access but tortuous subclavian, innominate artery requiring several catheters trial: Normal dominant RCA and LCX and possible mild mid - LAD disease otherwise unremarkable.  Echocardiogram on 05/23/2020 showed 55-60 % EF, moderate asymmetrical septal hypertrophy without obstruction, Mild MR and TR and diastolic dysfunction.  Treatments: cardiac meds: Aspirin, Eliquis, Rosuvastatin, metoprolol, amlodipine, furosemide, amiodarone and Potassium  Discharge Exam: Blood pressure 112/66, pulse 60, temperature 98.8 F (37.1 C), temperature source Oral, resp. rate 20, height 5\' 6"  (1.676 m), weight (!) 161.5 kg, SpO2 95 %. General appearance: alert, cooperative and appears stated age. Mild respiratory distress. Head: Normocephalic, atraumatic. Eyes: Brown eyes, pink conjunctiva, corneas clear. PERRL, EOM's intact.  Neck: No adenopathy, no carotid bruit, no JVD, supple, symmetrical, trachea midline and thyroid not enlarged. Resp: Clearing to auscultation bilaterally. Cardio: Regular rate and rhythm, S1, S2 normal, II/VI systolic murmur, no click, rub or gallop. GI: Soft, non-tender; bowel sounds normal; no organomegaly. Extremities: 1 + edema, cyanosis or clubbing. Right radial cath site is normal with small dressing. Skin: Warm and dry.  Neurologic: Alert and oriented X 3, normal strength and tone. Normal coordination and slow gait with walker.  Disposition: Discharge disposition: 01-Home or Self Care         Allergies as of 05/28/2020       Reactions   Ampicillin Other (See Comments)   Increases heart rhythm   Penicillins Other (See Comments)   Increases heart rhythm   Linzess [linaclotide] Other (See Comments)   Severe stomach pains        Medication List  STOP taking these medications    Linzess 145 MCG Caps capsule Generic drug: linaclotide       TAKE these medications    albuterol  108 (90 Base) MCG/ACT inhaler Commonly known as: VENTOLIN HFA Inhale 2 puffs into the lungs every 6 (six) hours as needed for wheezing or shortness of breath.   allopurinol 100 MG tablet Commonly known as: ZYLOPRIM Take 100 mg by mouth daily.   amiodarone 200 MG tablet Commonly known as: PACERONE Take 200 mg by mouth daily.   amLODipine 10 MG tablet Commonly known as: NORVASC Take 10 mg by mouth daily.   apixaban 5 MG Tabs tablet Commonly known as: ELIQUIS Take 5 mg by mouth 2 (two) times daily.   aspirin EC 81 MG tablet Take 81 mg by mouth daily.   clotrimazole 1 % cream Commonly known as: LOTRIMIN Apply 1 application topically daily as needed (to any rashes).   famotidine 40 MG tablet Commonly known as: PEPCID Take 40 mg by mouth daily as needed for heartburn or indigestion.   fluticasone 50 MCG/ACT nasal spray Commonly known as: FLONASE Place 2 sprays into both nostrils at bedtime as needed for allergies or rhinitis.   furosemide 40 MG tablet Commonly known as: LASIX Take 40 mg by mouth in the morning.   hydrALAZINE 50 MG tablet Commonly known as: APRESOLINE Take 50 mg by mouth 3 (three) times daily.   isosorbide mononitrate 60 MG 24 hr tablet Commonly known as: IMDUR Take 60 mg by mouth every morning.   losartan 100 MG tablet Commonly known as: COZAAR Take 100 mg by mouth daily.   lubiprostone 24 MCG capsule Commonly known as: AMITIZA Take 24 mcg by mouth 2 (two) times daily as needed for constipation.   meclizine 25 MG tablet Commonly known as: ANTIVERT Take 25 mg by mouth 2 (two) times daily.   METAMUCIL PO Take by mouth See admin instructions. Mix 1 teaspoonful into 8 ounces of water and drink once a day   metolazone 2.5 MG tablet Commonly known as: ZAROXOLYN Take 2.5 mg by mouth daily.   metoprolol succinate 100 MG 24 hr tablet Commonly known as: TOPROL-XL Take 50-100 mg by mouth See admin instructions. Take 100 mg by mouth in the morning  and 50 mg at bedtime   nitroGLYCERIN 0.4 MG SL tablet Commonly known as: NITROSTAT Place 0.4 mg under the tongue every 5 (five) minutes as needed for chest pain.   oxybutynin 5 MG tablet Commonly known as: DITROPAN Take 5 mg by mouth 2 (two) times daily.   oxyCODONE-acetaminophen 5-325 MG tablet Commonly known as: PERCOCET/ROXICET Take 1 tablet by mouth See admin instructions. Take 1 tablet by mouth two to three times a day   pantoprazole 40 MG tablet Commonly known as: PROTONIX Take 40 mg by mouth 2 (two) times daily.   potassium chloride SA 20 MEQ tablet Commonly known as: KLOR-CON Take 20 mEq by mouth 2 (two) times daily.   rosuvastatin 10 MG tablet Commonly known as: CRESTOR Take 10 mg by mouth at bedtime.   Vitamin D (Ergocalciferol) 1.25 MG (50000 UNIT) Caps capsule Commonly known as: DRISDOL Take 50,000 Units by mouth every Monday.        Follow-up Information     Dixie Dials, MD. Go on 07/07/2020.   Specialty: Cardiology Why: @3pm  Contact information: [place] [address] [phone]                 Time spent:  Review of old chart, current chart, lab, x-ray, cardiac tests and discussion with patient over 60 minutes.  Signed: Birdie Riddle 05/28/2020, 9:21 AM

## 2020-05-28 NOTE — TOC Initial Note (Addendum)
Transition of Care [facility]) - Initial/Assessment Note    Patient Details  Name: [name] MRN: [id number] Date of Birth: 30-Nov-1940  [facility] CM/SW Contact:    Zenon Mayo, RN Phone Number: 05/28/2020, 10:06 AM  Clinical Narrative:                 Patient is for dc, she states she would like to have [facility] for CHF management, since she does not know much about the disease.  NCM offered choice, she does not have a preferenc,  NCM made referral to [facility] with [place], he will check to see if they can take the referral.  Patient states she has transportation at dc, she has no issues with getting medications, she goes to [facility] on [address] in [place].  NCM received notification from [facility] with [facility] and he states he can not take this referral due to staffing,  NCM made referral to [place], they are able to take referral with soc on 8/9.  This will be ok.     Expected Discharge Plan: [place] Barriers to Discharge: No Barriers Identified   Patient Goals and CMS Choice Patient states their goals for this hospitalization and ongoing recovery are:: get better CMS Medicare.gov Compare Post Acute Care list provided to:: Patient Choice offered to / list presented to : Patient  Expected Discharge Plan and Services Expected Discharge Plan: [place]   Discharge Planning Services: CM Consult Post Acute Care Choice: [place] arrangements for the past 2 months: Single Family Home Expected Discharge Date: 05/28/20                 DME Agency: NA       HH Arranged: RN, Disease Management [place] Agency: [place] Date [facility] Agency Contacted: 05/28/20 Time HH Agency Contacted: 49 Representative spoke with at [place]: Tommi Rumps  Prior Living Arrangements/Services Living arrangements for the past 2 months: [place] Lives with:: Self Patient language and need for interpreter reviewed::  Yes Do you feel safe going back to the place where you live?: Yes      Need for Family Participation in Patient Care: Yes (Comment) Care giver support system in place?: Yes (comment) Current home services: DME (walker) Criminal Activity/Legal Involvement Pertinent to Current Situation/Hospitalization: No - Comment as needed  Activities of Daily Living Home Assistive Devices/Equipment: Walker (specify type) ADL Screening (condition at time of admission) Patient's cognitive ability adequate to safely complete daily activities?: Yes Is the patient deaf or have difficulty hearing?: No Does the patient have difficulty seeing, even when wearing glasses/contacts?: No Does the patient have difficulty concentrating, remembering, or making decisions?: No Patient able to express need for assistance with ADLs?: Yes Does the patient have difficulty dressing or bathing?: No Independently performs ADLs?: Yes (appropriate for developmental age) Does the patient have difficulty walking or climbing stairs?: Yes Weakness of Legs: Both Weakness of Arms/Hands: None  Permission Sought/Granted                  Emotional Assessment Appearance:: Appears stated age Attitude/Demeanor/Rapport: Engaged Affect (typically observed): Appropriate Orientation: : Oriented to Self, Oriented to Place, Oriented to  Time, Oriented to Situation Alcohol / Substance Use: Not Applicable Psych Involvement: No (comment)  Admission diagnosis:  Shortness of breath [R06.02] Patient Active Problem List   Diagnosis Date Noted  . Shortness of breath at rest 05/22/2020    Class:  Acute  . Palpitation 05/22/2020    Class: Acute  . Shortness of breath 05/22/2020  . Acute left systolic heart failure ([place]) 06/18/2019  . Acute systolic heart failure ([place]) 06/18/2019  . Third degree AV block ([place]) 03/27/2014  . Symptomatic sinus bradycardia 03/27/2014  . Pacemaker at end of battery life 03/27/2014  . CVA (cerebral  infarction) 10/17/2012  . Headache(784.0) 10/17/2012  . Coronary artery disease   . Hypertension   . Hyperlipemia   . Arthritis   . CHF (congestive heart failure) ([place])   . Vertigo   . TIA (transient ischemic attack)   . Pacemaker   . MI (myocardial infarction) ([place])   . Morbid obesity ([place])    PCP:  System, Pcp Not In Pharmacy:   [place] [place] [address] Phone: [phone] Fax: [phone]  [id number] - [place], [place] - [address] AT [address] [place] [address] Phone: [phone] Fax: [phone]     Social Determinants of Health (SDOH) Interventions    Readmission Risk Interventions Readmission Risk Prevention Plan 05/28/2020  Transportation Screening Complete  PCP or Specialist Appt within 3-5 Days Complete  HRI or [place] Complete  Social Work Consult for [place] Planning/Counseling Complete  Palliative Care Screening Not Applicable  Medication Review [employer]) Complete  Some recent data might be hidden

## 2020-06-01 DIAGNOSIS — E876 Hypokalemia: Secondary | ICD-10-CM | POA: Insufficient documentation

## 2020-06-24 DIAGNOSIS — Z6841 Body Mass Index (BMI) 40.0 and over, adult: Secondary | ICD-10-CM | POA: Insufficient documentation

## 2020-07-15 ENCOUNTER — Emergency Department (HOSPITAL_COMMUNITY): Payer: Medicare Other

## 2020-07-15 ENCOUNTER — Other Ambulatory Visit: Payer: Self-pay

## 2020-07-15 ENCOUNTER — Inpatient Hospital Stay (HOSPITAL_COMMUNITY)
Admission: EM | Admit: 2020-07-15 | Discharge: 2020-07-25 | DRG: 871 | Disposition: A | Discharge status: Home Health | Payer: Medicare Other | Attending: Internal Medicine | Admitting: Internal Medicine

## 2020-07-15 DIAGNOSIS — A4151 Sepsis due to Escherichia coli [E. coli]: Principal | ICD-10-CM | POA: Diagnosis present

## 2020-07-15 DIAGNOSIS — Z7901 Long term (current) use of anticoagulants: Secondary | ICD-10-CM

## 2020-07-15 DIAGNOSIS — I10 Essential (primary) hypertension: Secondary | ICD-10-CM | POA: Diagnosis present

## 2020-07-15 DIAGNOSIS — Z6841 Body Mass Index (BMI) 40.0 and over, adult: Secondary | ICD-10-CM

## 2020-07-15 DIAGNOSIS — G9340 Encephalopathy, unspecified: Secondary | ICD-10-CM | POA: Diagnosis present

## 2020-07-15 DIAGNOSIS — R112 Nausea with vomiting, unspecified: Secondary | ICD-10-CM

## 2020-07-15 DIAGNOSIS — J189 Pneumonia, unspecified organism: Secondary | ICD-10-CM | POA: Diagnosis present

## 2020-07-15 DIAGNOSIS — Z8673 Personal history of transient ischemic attack (TIA), and cerebral infarction without residual deficits: Secondary | ICD-10-CM

## 2020-07-15 DIAGNOSIS — K219 Gastro-esophageal reflux disease without esophagitis: Secondary | ICD-10-CM | POA: Diagnosis present

## 2020-07-15 DIAGNOSIS — I214 Non-ST elevation (NSTEMI) myocardial infarction: Secondary | ICD-10-CM

## 2020-07-15 DIAGNOSIS — N179 Acute kidney failure, unspecified: Secondary | ICD-10-CM | POA: Diagnosis present

## 2020-07-15 DIAGNOSIS — N1832 Chronic kidney disease, stage 3b: Secondary | ICD-10-CM | POA: Diagnosis present

## 2020-07-15 DIAGNOSIS — A419 Sepsis, unspecified organism: Secondary | ICD-10-CM | POA: Diagnosis present

## 2020-07-15 DIAGNOSIS — N390 Urinary tract infection, site not specified: Secondary | ICD-10-CM | POA: Diagnosis present

## 2020-07-15 DIAGNOSIS — I959 Hypotension, unspecified: Secondary | ICD-10-CM | POA: Diagnosis present

## 2020-07-15 DIAGNOSIS — D539 Nutritional anemia, unspecified: Secondary | ICD-10-CM

## 2020-07-15 DIAGNOSIS — M1990 Unspecified osteoarthritis, unspecified site: Secondary | ICD-10-CM | POA: Diagnosis present

## 2020-07-15 DIAGNOSIS — L03115 Cellulitis of right lower limb: Secondary | ICD-10-CM

## 2020-07-15 DIAGNOSIS — Z20822 Contact with and (suspected) exposure to covid-19: Secondary | ICD-10-CM | POA: Diagnosis present

## 2020-07-15 DIAGNOSIS — Z9119 Patient's noncompliance with other medical treatment and regimen: Secondary | ICD-10-CM

## 2020-07-15 DIAGNOSIS — I890 Lymphedema, not elsewhere classified: Secondary | ICD-10-CM | POA: Diagnosis present

## 2020-07-15 DIAGNOSIS — Z7982 Long term (current) use of aspirin: Secondary | ICD-10-CM

## 2020-07-15 DIAGNOSIS — R6520 Severe sepsis without septic shock: Secondary | ICD-10-CM | POA: Diagnosis present

## 2020-07-15 DIAGNOSIS — I5042 Chronic combined systolic (congestive) and diastolic (congestive) heart failure: Secondary | ICD-10-CM | POA: Diagnosis present

## 2020-07-15 DIAGNOSIS — M1711 Unilateral primary osteoarthritis, right knee: Secondary | ICD-10-CM | POA: Diagnosis present

## 2020-07-15 DIAGNOSIS — G4733 Obstructive sleep apnea (adult) (pediatric): Secondary | ICD-10-CM | POA: Diagnosis present

## 2020-07-15 DIAGNOSIS — N289 Disorder of kidney and ureter, unspecified: Secondary | ICD-10-CM | POA: Diagnosis not present

## 2020-07-15 DIAGNOSIS — I878 Other specified disorders of veins: Secondary | ICD-10-CM | POA: Diagnosis present

## 2020-07-15 DIAGNOSIS — I252 Old myocardial infarction: Secondary | ICD-10-CM

## 2020-07-15 DIAGNOSIS — I480 Paroxysmal atrial fibrillation: Secondary | ICD-10-CM

## 2020-07-15 DIAGNOSIS — I2510 Atherosclerotic heart disease of native coronary artery without angina pectoris: Secondary | ICD-10-CM | POA: Diagnosis present

## 2020-07-15 DIAGNOSIS — E785 Hyperlipidemia, unspecified: Secondary | ICD-10-CM | POA: Diagnosis present

## 2020-07-15 DIAGNOSIS — I442 Atrioventricular block, complete: Secondary | ICD-10-CM | POA: Diagnosis present

## 2020-07-15 DIAGNOSIS — I130 Hypertensive heart and chronic kidney disease with heart failure and stage 1 through stage 4 chronic kidney disease, or unspecified chronic kidney disease: Secondary | ICD-10-CM | POA: Diagnosis present

## 2020-07-15 DIAGNOSIS — I5032 Chronic diastolic (congestive) heart failure: Secondary | ICD-10-CM

## 2020-07-15 DIAGNOSIS — Z950 Presence of cardiac pacemaker: Secondary | ICD-10-CM | POA: Diagnosis present

## 2020-07-15 LAB — COMPREHENSIVE METABOLIC PANEL
ALT: 13 U/L (ref 0–44)
AST: 22 U/L (ref 15–41)
Albumin: 3.5 g/dL (ref 3.5–5.0)
Alkaline Phosphatase: 75 U/L (ref 38–126)
Anion gap: 10 (ref 5–15)
BUN: 20 mg/dL (ref 8–23)
CO2: 24 mmol/L (ref 22–32)
Calcium: 8.6 mg/dL — ABNORMAL LOW (ref 8.9–10.3)
Chloride: 105 mmol/L (ref 98–111)
Creatinine, Ser: 1.74 mg/dL — ABNORMAL HIGH (ref 0.44–1.00)
GFR calc Af Amer: 32 mL/min — ABNORMAL LOW (ref >60)
GFR calc non Af Amer: 28 mL/min — ABNORMAL LOW (ref >60)
Glucose, Bld: 128 mg/dL — ABNORMAL HIGH (ref 70–99)
Potassium: 3.8 mmol/L (ref 3.5–5.1)
Sodium: 139 mmol/L (ref 135–145)
Total Bilirubin: 0.7 mg/dL (ref 0.3–1.2)
Total Protein: 7.3 g/dL (ref 6.5–8.1)

## 2020-07-15 LAB — CBC
HCT: 38.8 % (ref 36.0–46.0)
Hemoglobin: 12.2 g/dL (ref 12.0–15.0)
MCH: 31.7 pg (ref 26.0–34.0)
MCHC: 31.4 g/dL (ref 30.0–36.0)
MCV: 100.8 fL — ABNORMAL HIGH (ref 80.0–100.0)
Platelets: 203 10*3/uL (ref 150–400)
RBC: 3.85 MIL/uL — ABNORMAL LOW (ref 3.87–5.11)
RDW: 16.2 % — ABNORMAL HIGH (ref 11.5–15.5)
WBC: 11.4 10*3/uL — ABNORMAL HIGH (ref 4.0–10.5)
nRBC: 0 % (ref 0.0–0.2)

## 2020-07-15 LAB — TROPONIN I (HIGH SENSITIVITY): Troponin I (High Sensitivity): 9 ng/L (ref <18)

## 2020-07-15 LAB — LIPASE, BLOOD: Lipase: 18 U/L (ref 11–51)

## 2020-07-15 MED ORDER — FAMOTIDINE IN NACL 20-0.9 MG/50ML-% IV SOLN
20.0000 mg | Freq: Once | INTRAVENOUS | Status: AC
  Administered 2020-07-15: 20 mg via INTRAVENOUS
  Filled 2020-07-15: qty 50

## 2020-07-15 MED ORDER — ACETAMINOPHEN 500 MG PO TABS
1000.0000 mg | ORAL_TABLET | Freq: Once | ORAL | Status: AC
  Administered 2020-07-15: 1000 mg via ORAL
  Filled 2020-07-15: qty 2

## 2020-07-15 MED ORDER — MORPHINE SULFATE (PF) 4 MG/ML IV SOLN
4.0000 mg | Freq: Once | INTRAVENOUS | Status: AC
  Administered 2020-07-15: 4 mg via INTRAVENOUS
  Filled 2020-07-15: qty 1

## 2020-07-15 MED ORDER — PROMETHAZINE HCL 25 MG/ML IJ SOLN
12.5000 mg | Freq: Once | INTRAMUSCULAR | Status: AC
  Administered 2020-07-15: 12.5 mg via INTRAVENOUS
  Filled 2020-07-15: qty 1

## 2020-07-15 NOTE — ED Provider Notes (Signed)
[facility] EMERGENCY DEPARTMENT Provider Note   CSN: [id number] Arrival date & time: 07/15/20  2100     History Chief Complaint  Patient presents with  . Headache  . Nausea    [name] is a 79 y.o. female.  Pt presents to the ED today with n/v, cp, abd pain.  Pt said sx started around 1700.  She was given zofran by EMS, but still feels nauseous.  She was unaware she had a fever.  No diarrhea.        Past Medical History:  Diagnosis Date  . Arthritis   . CHF (congestive heart failure) ([place])    05/2011 echo severe concentric LVH, normal systolic fxn, grade 1 diastolic dysfxn, paradoxical septal motion  . Coronary artery disease    Cath 04/2006 normal coronaries, mild-mod slow flow in all coronaries with slow distall runoff, mild-mod LV enlargement, EF 55%   . Depression   . Dyspnea   . Dysrhythmia   . GERD (gastroesophageal reflux disease)   . Hypercholesterolemia   . Hyperlipemia   . Hypertension   . MI (myocardial infarction) ([place])    NOT SURE HOW MANY  . Morbid obesity ([place])   . Nocturia   . 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 Peripheral vascular disease ([place])   . Pneumonia NONE RECENT  . Sleep apnea    needs cpap but does not wear  . Stroke ([place])   . 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 Vertigo    takes meclizine daily    Patient Active Problem List   Diagnosis Date Noted  . Shortness of breath at rest 05/22/2020    Class: Acute  . Palpitation 05/22/2020    Class: Acute  . Shortness of breath 05/22/2020  . Acute left systolic heart failure ([place]) 06/18/2019  . Acute systolic heart failure ([place]) 06/18/2019  . 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 Symptomatic sinus bradycardia 03/27/2014  . Pacemaker at end of battery life 03/27/2014  . CVA (cerebral infarction) 10/17/2012  . Headache(784.0) 10/17/2012  . Coronary artery disease   . Hypertension   .  Hyperlipemia   . Arthritis   . CHF (congestive heart failure) ([place])   . Vertigo   . TIA (transient ischemic attack)   . Pacemaker   . MI (myocardial infarction) ([place])   . Morbid obesity ([place])     Past Surgical History:  Procedure Laterality Date  . ABDOMINAL HYSTERECTOMY    . CARDIAC CATHETERIZATION    . CATARACT EXTRACTION W/PHACO Left 08/29/2013   Procedure: CATARACT EXTRACTION PHACO AND INTRAOCULAR LENS PLACEMENT (IOC);  Surgeon: Adonis Brook, MD;  Location: [facility];  Service: Ophthalmology;  Laterality: Left;  . COLONOSCOPY WITH PROPOFOL N/A 01/07/2014   Procedure: COLONOSCOPY WITH PROPOFOL;  Surgeon: Juanita Craver, MD;  Location: [facility] END[facility];  Service: Endoscopy;  Laterality: N/A;  . CORONARY ANGIOGRAPHY N/A 05/27/2020   Procedure: CORONARY ANGIOGRAPHY;  Surgeon: Dixie Dials, MD;  Location: [facility] CV LAB;  Service: Cardiovascular;  Laterality: N/A;  . DILATION AND CURETTAGE OF UTERUS    . INSERT / REPLACE / REMOVE PACEMAKER    . PACEMAKER GENERATOR CHANGE N/A 03/27/2014   Procedure: PACEMAKER GENERATOR CHANGE;  Surgeon: Sanda Klein, MD;  Location: [facility] CATH LAB;  Service: Cardiovascular;  Laterality: N/A;  . PACEMAKER INSERTION    . PACEMAKER INSERTION    . TUBAL LIGATION    .  VASCULAR SURGERY     vericose vein surgery     OB History   No obstetric history on file.     No family history on file.  Social History   Tobacco Use  . Smoking status: Never Smoker  . Smokeless tobacco: Never Used  Vaping Use  . Vaping Use: Never used  Substance Use Topics  . Alcohol use: No  . Drug use: No    Home Medications Prior to Admission medications   Medication Sig Start Date End Date Taking? Authorizing Provider  albuterol (VENTOLIN HFA) 108 (90 Base) MCG/ACT inhaler Inhale 2 puffs into the lungs every 6 (six) hours as needed for wheezing or shortness of breath.  10/12/19   [provider]  allopurinol (ZYLOPRIM) 100 MG tablet Take 100 mg by mouth daily.     [provider]  amiodarone (PACERONE) 200 MG tablet Take 200 mg by mouth daily.    [provider]  amLODipine (NORVASC) 10 MG tablet Take 10 mg by mouth daily. 11/20/19   [provider]  apixaban (ELIQUIS) 5 MG TABS tablet Take 5 mg by mouth 2 (two) times daily. 02/11/17   [provider]  aspirin EC 81 MG tablet Take 81 mg by mouth daily.    [provider]  clotrimazole (LOTRIMIN) 1 % cream Apply 1 application topically daily as needed (to any rashes).  11/22/19   [provider]  famotidine (PEPCID) 40 MG tablet Take 40 mg by mouth daily as needed for heartburn or indigestion.  11/29/19   [provider]  fluticasone (FLONASE) 50 MCG/ACT nasal spray Place 2 sprays into both nostrils at bedtime as needed for allergies or rhinitis.  07/08/19   [provider]  furosemide (LASIX) 40 MG tablet Take 40 mg by mouth in the morning.     [provider]  hydrALAZINE (APRESOLINE) 50 MG tablet Take 50 mg by mouth 3 (three) times daily. 09/10/19   [provider]  isosorbide mononitrate (IMDUR) 60 MG 24 hr tablet Take 60 mg by mouth every morning.     [provider]  losartan (COZAAR) 100 MG tablet Take 100 mg by mouth daily.    [provider]  lubiprostone (AMITIZA) 24 MCG capsule Take 24 mcg by mouth 2 (two) times daily as needed for constipation.  11/26/19   [provider]  meclizine (ANTIVERT) 25 MG tablet Take 25 mg by mouth 2 (two) times daily.     [provider]  metolazone (ZAROXOLYN) 2.5 MG tablet Take 2.5 mg by mouth daily.    [provider]  metoprolol succinate (TOPROL-XL) 100 MG 24 hr tablet Take 50-100 mg by mouth See admin instructions. Take 100 mg by mouth in the morning and 50 mg at bedtime 10/29/19   [provider]  nitroGLYCERIN (NITROSTAT) 0.4 MG SL tablet Place 0.4 mg under the tongue every 5 (five) minutes as needed for chest pain.     [provider]  oxybutynin (DITROPAN) 5 MG tablet Take 5 mg by mouth 2 (two) times daily.    [provider]  oxyCODONE-acetaminophen (PERCOCET/ROXICET) 5-325 MG tablet Take 1 tablet by mouth See admin instructions. Take 1 tablet by mouth two to three times a day 11/20/19   [provider]  pantoprazole (PROTONIX) 40 MG tablet Take 40 mg by mouth 2 (two) times daily.     [provider]  potassium chloride SA (K-DUR,KLOR-CON) 20 MEQ tablet Take 20 mEq by mouth  2 (two) times daily.     [provider]  Psyllium (METAMUCIL PO) Take by mouth See admin instructions. Mix 1 teaspoonful into 8 ounces of water and drink once a day    [provider]  rosuvastatin (CRESTOR) 10 MG tablet Take 10 mg by mouth at bedtime.     [provider]  Vitamin D, Ergocalciferol, (DRISDOL) 1.25 MG (50000 UNIT) CAPS capsule Take 50,000 Units by mouth every Monday. 10/01/19   [provider]    Allergies    Ampicillin, Penicillins, and Linzess [linaclotide]  Review of Systems   Review of Systems  Cardiovascular: Positive for chest pain.  Gastrointestinal: Positive for abdominal pain, nausea and vomiting.  Neurological: Positive for headaches.  All other systems reviewed and are negative.   Physical Exam Updated Vital Signs BP (!) 153/89 (BP Location: Left Arm)   Pulse 63   Temp (!) 100.8 F (38.2 C) (Oral)   Resp 11   Ht 5\' 6"  (1.676 m)   Wt (!) 150.1 kg   SpO2 100%   BMI 53.42 kg/m   Physical Exam Vitals and nursing note reviewed.  Constitutional:      Appearance: She is well-developed. She is obese. She is ill-appearing.  HENT:     Head: Normocephalic and atraumatic.     Mouth/Throat:     Mouth: Mucous membranes are dry.  Eyes:     Extraocular Movements: Extraocular movements intact.     Pupils: Pupils are equal, round, and reactive to light.  Cardiovascular:     Rate and Rhythm: Normal rate and regular rhythm.  Pulmonary:      Effort: Pulmonary effort is normal.     Breath sounds: Normal breath sounds.  Abdominal:     General: Bowel sounds are normal.     Palpations: Abdomen is soft.     Tenderness: There is generalized abdominal tenderness.  Musculoskeletal:        General: Normal range of motion.     Cervical back: Normal range of motion and neck supple.  Skin:    General: Skin is warm.     Capillary Refill: Capillary refill takes less than 2 seconds.  Neurological:     Mental Status: She is alert and oriented to person, place, and time.  Psychiatric:        Mood and Affect: Mood normal.        Speech: Speech normal.        Behavior: Behavior normal.     ED Results / Procedures / Treatments   Labs (all labs ordered are listed, but only abnormal results are displayed) Labs Reviewed  CBC - Abnormal; Notable for the following components:      Result Value   WBC 11.4 (*)    RBC 3.85 (*)    MCV 100.8 (*)    RDW 16.2 (*)    All other components within normal limits  COMPREHENSIVE METABOLIC PANEL - Abnormal; Notable for the following components:   Glucose, Bld 128 (*)    Creatinine, Ser 1.74 (*)    Calcium 8.6 (*)    GFR calc non Af Amer 28 (*)    GFR calc Af Amer 32 (*)    All other components within normal limits  SARS CORONAVIRUS 2 BY RT PCR (HOSPITAL ORDER, [facility])  LIPASE, BLOOD  URINALYSIS, ROUTINE W REFLEX MICROSCOPIC  TROPONIN I (HIGH SENSITIVITY)  TROPONIN I (HIGH SENSITIVITY)    EKG EKG Interpretation  Date/Time:  Tuesday July 15 2020 21:20:29 EDT Ventricular Rate:  63 PR Interval:    QRS Duration: 194 QT Interval:  496 QTC Calculation: 508 R Axis:   -70 Text Interpretation: Sinus rhythm Prolonged PR interval Probable left atrial enlargement Left bundle branch block No significant change since last tracing Confirmed by Isla Pence [phone]) on 07/15/2020 10:09:48 PM   Radiology DG Chest Port 1 View  Result Date: 07/15/2020 CLINICAL  DATA:  Headache, vomiting, chest pain EXAM: PORTABLE CHEST 1 VIEW COMPARISON:  05/22/2020 FINDINGS: Single frontal view of the chest demonstrates stable dual lead pacemaker. The cardiac silhouette is enlarged. There is increased central vascular congestion, without airspace disease, effusion, or pneumothorax. No acute bony abnormalities. IMPRESSION: 1. Central vascular congestion without overt edema. 2. Stable enlarged cardiac silhouette. Electronically Signed   By: Randa Ngo M.D.   On: 07/15/2020 21:25    Procedures Procedures (including critical care time)  Medications Ordered in ED Medications  morphine 4 MG/ML injection 4 mg (4 mg Intravenous Given 07/15/20 2208)  famotidine (PEPCID) IVPB 20 mg premix (0 mg Intravenous Stopped 07/15/20 2309)  promethazine (PHENERGAN) injection 12.5 mg (12.5 mg Intravenous Given 07/15/20 2207)  acetaminophen (TYLENOL) tablet 1,000 mg (1,000 mg Oral Given 07/15/20 2301)    ED Course  I have reviewed the triage vital signs and the nursing notes.  Pertinent labs & imaging results that were available during my care of the patient were reviewed by me and considered in my medical decision making (see chart for details).    MDM Rules/Calculators/A&P                          Pt's urine and CTs are pending at shift change.   Pt signed out to Dr. Roxanne Mins at shift change.  Final Clinical Impression(s) / ED Diagnoses Final diagnoses:  Nausea and vomiting, intractability of vomiting not specified, unspecified vomiting type    Rx / DC Orders ED Discharge Orders    None       Isla Pence, MD 07/15/20 2311

## 2020-07-15 NOTE — ED Triage Notes (Signed)
EMS arrival from home co headache and vomiting starting around 5pm. Denies chest pain until arrival to [facility], active vomiting with EMS. Cardiac hx, pacemaker [employer], stroke screen clear per EMS.

## 2020-07-16 ENCOUNTER — Encounter (HOSPITAL_COMMUNITY): Payer: Self-pay | Admitting: Family Medicine

## 2020-07-16 ENCOUNTER — Emergency Department (HOSPITAL_COMMUNITY): Payer: Medicare Other

## 2020-07-16 ENCOUNTER — Inpatient Hospital Stay (HOSPITAL_COMMUNITY): Payer: Medicare Other

## 2020-07-16 DIAGNOSIS — Z9119 Patient's noncompliance with other medical treatment and regimen: Secondary | ICD-10-CM | POA: Diagnosis not present

## 2020-07-16 DIAGNOSIS — M1711 Unilateral primary osteoarthritis, right knee: Secondary | ICD-10-CM | POA: Diagnosis present

## 2020-07-16 DIAGNOSIS — E785 Hyperlipidemia, unspecified: Secondary | ICD-10-CM | POA: Diagnosis present

## 2020-07-16 DIAGNOSIS — Z6841 Body Mass Index (BMI) 40.0 and over, adult: Secondary | ICD-10-CM | POA: Diagnosis not present

## 2020-07-16 DIAGNOSIS — Z8673 Personal history of transient ischemic attack (TIA), and cerebral infarction without residual deficits: Secondary | ICD-10-CM | POA: Diagnosis not present

## 2020-07-16 DIAGNOSIS — I2510 Atherosclerotic heart disease of native coronary artery without angina pectoris: Secondary | ICD-10-CM | POA: Diagnosis present

## 2020-07-16 DIAGNOSIS — M7989 Other specified soft tissue disorders: Secondary | ICD-10-CM | POA: Diagnosis not present

## 2020-07-16 DIAGNOSIS — I5032 Chronic diastolic (congestive) heart failure: Secondary | ICD-10-CM

## 2020-07-16 DIAGNOSIS — I890 Lymphedema, not elsewhere classified: Secondary | ICD-10-CM | POA: Diagnosis present

## 2020-07-16 DIAGNOSIS — N390 Urinary tract infection, site not specified: Secondary | ICD-10-CM | POA: Diagnosis present

## 2020-07-16 DIAGNOSIS — R6520 Severe sepsis without septic shock: Secondary | ICD-10-CM | POA: Diagnosis present

## 2020-07-16 DIAGNOSIS — N289 Disorder of kidney and ureter, unspecified: Secondary | ICD-10-CM | POA: Diagnosis present

## 2020-07-16 DIAGNOSIS — A4151 Sepsis due to Escherichia coli [E. coli]: Secondary | ICD-10-CM | POA: Diagnosis present

## 2020-07-16 DIAGNOSIS — I959 Hypotension, unspecified: Secondary | ICD-10-CM | POA: Diagnosis not present

## 2020-07-16 DIAGNOSIS — L538 Other specified erythematous conditions: Secondary | ICD-10-CM | POA: Diagnosis not present

## 2020-07-16 DIAGNOSIS — I252 Old myocardial infarction: Secondary | ICD-10-CM | POA: Diagnosis not present

## 2020-07-16 DIAGNOSIS — N179 Acute kidney failure, unspecified: Secondary | ICD-10-CM | POA: Diagnosis present

## 2020-07-16 DIAGNOSIS — I130 Hypertensive heart and chronic kidney disease with heart failure and stage 1 through stage 4 chronic kidney disease, or unspecified chronic kidney disease: Secondary | ICD-10-CM | POA: Diagnosis present

## 2020-07-16 DIAGNOSIS — N1832 Chronic kidney disease, stage 3b: Secondary | ICD-10-CM | POA: Diagnosis present

## 2020-07-16 DIAGNOSIS — I214 Non-ST elevation (NSTEMI) myocardial infarction: Secondary | ICD-10-CM | POA: Diagnosis present

## 2020-07-16 DIAGNOSIS — A419 Sepsis, unspecified organism: Secondary | ICD-10-CM | POA: Diagnosis present

## 2020-07-16 DIAGNOSIS — G9340 Encephalopathy, unspecified: Secondary | ICD-10-CM | POA: Diagnosis present

## 2020-07-16 DIAGNOSIS — L03115 Cellulitis of right lower limb: Secondary | ICD-10-CM | POA: Diagnosis present

## 2020-07-16 DIAGNOSIS — Z20822 Contact with and (suspected) exposure to covid-19: Secondary | ICD-10-CM | POA: Diagnosis present

## 2020-07-16 DIAGNOSIS — I878 Other specified disorders of veins: Secondary | ICD-10-CM | POA: Diagnosis present

## 2020-07-16 DIAGNOSIS — I442 Atrioventricular block, complete: Secondary | ICD-10-CM | POA: Diagnosis present

## 2020-07-16 DIAGNOSIS — I480 Paroxysmal atrial fibrillation: Secondary | ICD-10-CM

## 2020-07-16 DIAGNOSIS — J189 Pneumonia, unspecified organism: Secondary | ICD-10-CM | POA: Diagnosis present

## 2020-07-16 DIAGNOSIS — I5042 Chronic combined systolic (congestive) and diastolic (congestive) heart failure: Secondary | ICD-10-CM | POA: Diagnosis present

## 2020-07-16 LAB — ECHOCARDIOGRAM COMPLETE
Area-P 1/2: 2.95 cm2
Height: 66 in
S' Lateral: 3.4 cm
Weight: 5296 oz

## 2020-07-16 LAB — RESPIRATORY PANEL BY PCR

## 2020-07-16 LAB — CBC
HCT: 39.2 % (ref 36.0–46.0)
Hemoglobin: 12.1 g/dL (ref 12.0–15.0)
MCH: 31.6 pg (ref 26.0–34.0)
MCHC: 30.9 g/dL (ref 30.0–36.0)
MCV: 102.3 fL — ABNORMAL HIGH (ref 80.0–100.0)
Platelets: 172 10*3/uL (ref 150–400)
RBC: 3.83 MIL/uL — ABNORMAL LOW (ref 3.87–5.11)
RDW: 16.8 % — ABNORMAL HIGH (ref 11.5–15.5)
WBC: 17 10*3/uL — ABNORMAL HIGH (ref 4.0–10.5)
nRBC: 0 % (ref 0.0–0.2)

## 2020-07-16 LAB — BASIC METABOLIC PANEL
Anion gap: 14 (ref 5–15)
BUN: 23 mg/dL (ref 8–23)
CO2: 21 mmol/L — ABNORMAL LOW (ref 22–32)
Calcium: 8.2 mg/dL — ABNORMAL LOW (ref 8.9–10.3)
Chloride: 107 mmol/L (ref 98–111)
Creatinine, Ser: 2.42 mg/dL — ABNORMAL HIGH (ref 0.44–1.00)
GFR calc Af Amer: 22 mL/min — ABNORMAL LOW (ref >60)
GFR calc non Af Amer: 19 mL/min — ABNORMAL LOW (ref >60)
Glucose, Bld: 130 mg/dL — ABNORMAL HIGH (ref 70–99)
Potassium: 4.3 mmol/L (ref 3.5–5.1)
Sodium: 142 mmol/L (ref 135–145)

## 2020-07-16 LAB — STREP PNEUMONIAE URINARY ANTIGEN: Strep Pneumo Urinary Antigen: NEGATIVE

## 2020-07-16 LAB — TROPONIN I (HIGH SENSITIVITY)
Troponin I (High Sensitivity): 39 ng/L — ABNORMAL HIGH (ref <18)
Troponin I (High Sensitivity): 849 ng/L (ref <18)
Troponin I (High Sensitivity): 813 ng/L (ref <18)
Troponin I (High Sensitivity): 925 ng/L (ref <18)
Troponin I (High Sensitivity): 664 ng/L (ref <18)

## 2020-07-16 LAB — URINALYSIS, ROUTINE W REFLEX MICROSCOPIC
Bilirubin Urine: NEGATIVE
Glucose, UA: NEGATIVE mg/dL
Hgb urine dipstick: NEGATIVE
Ketones, ur: NEGATIVE mg/dL
Nitrite: NEGATIVE
Protein, ur: NEGATIVE mg/dL
Specific Gravity, Urine: 1.015 (ref 1.005–1.030)
pH: 5 (ref 5.0–8.0)

## 2020-07-16 LAB — MAGNESIUM: Magnesium: 1.4 mg/dL — ABNORMAL LOW (ref 1.7–2.4)

## 2020-07-16 LAB — LIPID PANEL
Cholesterol: 100 mg/dL (ref 0–200)
HDL: 48 mg/dL (ref >40)
LDL Cholesterol: 43 mg/dL (ref 0–99)
Total CHOL/HDL Ratio: 2.1 RATIO
Triglycerides: 44 mg/dL (ref <150)
VLDL: 9 mg/dL (ref 0–40)

## 2020-07-16 LAB — HEPARIN LEVEL (UNFRACTIONATED): Heparin Unfractionated: 1.84 IU/mL — ABNORMAL HIGH (ref 0.30–0.70)

## 2020-07-16 LAB — LACTIC ACID, PLASMA
Lactic Acid, Venous: 2.8 mmol/L (ref 0.5–1.9)
Lactic Acid, Venous: 1.5 mmol/L (ref 0.5–1.9)

## 2020-07-16 LAB — PROCALCITONIN: Procalcitonin: 17.03 ng/mL

## 2020-07-16 LAB — PROTEIN / CREATININE RATIO, URINE
Creatinine, Urine: 173.21 mg/dL
Protein Creatinine Ratio: 0.05 mg/mg{Cre} (ref 0.00–0.15)
Total Protein, Urine: 9 mg/dL

## 2020-07-16 LAB — SODIUM, URINE, RANDOM: Sodium, Ur: 42 mmol/L

## 2020-07-16 LAB — SARS CORONAVIRUS 2 BY RT PCR (HOSPITAL ORDER, PERFORMED IN ~~LOC~~ HOSPITAL LAB): SARS Coronavirus 2: NEGATIVE

## 2020-07-16 LAB — APTT: aPTT: 77 seconds — ABNORMAL HIGH (ref 24–36)

## 2020-07-16 MED ORDER — ALBUTEROL SULFATE HFA 108 (90 BASE) MCG/ACT IN AERS
2.0000 | INHALATION_SPRAY | Freq: Four times a day (QID) | RESPIRATORY_TRACT | Status: DC | PRN
  Filled 2020-07-16: qty 6.7

## 2020-07-16 MED ORDER — MORPHINE SULFATE (PF) 4 MG/ML IV SOLN
4.0000 mg | Freq: Once | INTRAVENOUS | Status: AC
  Administered 2020-07-16: 4 mg via INTRAVENOUS
  Filled 2020-07-16: qty 1

## 2020-07-16 MED ORDER — ASPIRIN 300 MG RE SUPP: 300.0000 mg | RECTAL | Status: AC

## 2020-07-16 MED ORDER — HYDRALAZINE HCL 25 MG PO TABS: 50.0000 mg | ORAL_TABLET | Freq: Three times a day (TID) | ORAL | Status: DC

## 2020-07-16 MED ORDER — SODIUM CHLORIDE 0.9 % IV SOLN: 500.0000 mg | INTRAVENOUS | Status: DC

## 2020-07-16 MED ORDER — ISOSORBIDE MONONITRATE ER 30 MG PO TB24: 60.0000 mg | ORAL_TABLET | Freq: Every morning | ORAL | Status: DC

## 2020-07-16 MED ORDER — AMIODARONE HCL 200 MG PO TABS
200.0000 mg | ORAL_TABLET | Freq: Every day | ORAL | Status: DC
  Administered 2020-07-17 – 2020-07-25 (×9): 200 mg via ORAL
  Filled 2020-07-16 (×9): qty 1

## 2020-07-16 MED ORDER — ASPIRIN EC 81 MG PO TBEC
81.0000 mg | DELAYED_RELEASE_TABLET | Freq: Every day | ORAL | Status: DC
  Administered 2020-07-16: 81 mg via ORAL
  Filled 2020-07-16: qty 1

## 2020-07-16 MED ORDER — PSYLLIUM 95 % PO PACK
1.0000 | PACK | Freq: Every day | ORAL | Status: DC
  Administered 2020-07-17 – 2020-07-25 (×7): 1 via ORAL
  Filled 2020-07-16 (×10): qty 1

## 2020-07-16 MED ORDER — LOSARTAN POTASSIUM 50 MG PO TABS: 100.0000 mg | ORAL_TABLET | Freq: Every day | ORAL | Status: DC

## 2020-07-16 MED ORDER — HYDROCODONE-ACETAMINOPHEN 5-325 MG PO TABS
1.0000 | ORAL_TABLET | Freq: Four times a day (QID) | ORAL | Status: AC | PRN
  Administered 2020-07-16 – 2020-07-17 (×3): 1 via ORAL
  Filled 2020-07-16 (×3): qty 1

## 2020-07-16 MED ORDER — PROCHLORPERAZINE EDISYLATE 10 MG/2ML IJ SOLN
10.0000 mg | Freq: Once | INTRAMUSCULAR | Status: AC
  Administered 2020-07-16: 10 mg via INTRAVENOUS
  Filled 2020-07-16: qty 2

## 2020-07-16 MED ORDER — ASPIRIN 81 MG PO CHEW
324.0000 mg | CHEWABLE_TABLET | ORAL | Status: AC
  Administered 2020-07-16: 324 mg via ORAL
  Filled 2020-07-16: qty 4

## 2020-07-16 MED ORDER — MAGNESIUM SULFATE 2 GM/50ML IV SOLN
2.0000 g | Freq: Once | INTRAVENOUS | Status: AC
  Administered 2020-07-16: 2 g via INTRAVENOUS
  Filled 2020-07-16: qty 50

## 2020-07-16 MED ORDER — SODIUM CHLORIDE 0.9 % IV SOLN
1.0000 g | INTRAVENOUS | Status: DC
  Administered 2020-07-16 – 2020-07-17 (×2): 1 g via INTRAVENOUS
  Filled 2020-07-16 (×2): qty 10

## 2020-07-16 MED ORDER — DOXYCYCLINE HYCLATE 100 MG PO TABS
100.0000 mg | ORAL_TABLET | Freq: Two times a day (BID) | ORAL | Status: DC
  Administered 2020-07-16 – 2020-07-19 (×7): 100 mg via ORAL
  Filled 2020-07-16 (×7): qty 1

## 2020-07-16 MED ORDER — METOPROLOL SUCCINATE ER 25 MG PO TB24: 50.0000 mg | ORAL_TABLET | ORAL | Status: DC

## 2020-07-16 MED ORDER — ROSUVASTATIN CALCIUM 5 MG PO TABS
10.0000 mg | ORAL_TABLET | Freq: Every day | ORAL | Status: DC
  Administered 2020-07-16 – 2020-07-24 (×10): 10 mg via ORAL
  Filled 2020-07-16 (×11): qty 2

## 2020-07-16 MED ORDER — ALLOPURINOL 100 MG PO TABS
100.0000 mg | ORAL_TABLET | Freq: Every day | ORAL | Status: DC
  Administered 2020-07-17 – 2020-07-25 (×9): 100 mg via ORAL
  Filled 2020-07-16 (×10): qty 1

## 2020-07-16 MED ORDER — SODIUM CHLORIDE 0.9 % IV SOLN: INTRAVENOUS | Status: DC

## 2020-07-16 MED ORDER — ASPIRIN 81 MG PO CHEW
324.0000 mg | CHEWABLE_TABLET | Freq: Once | ORAL | Status: AC
  Administered 2020-07-16: 324 mg via ORAL
  Filled 2020-07-16: qty 4

## 2020-07-16 MED ORDER — ACETAMINOPHEN 325 MG PO TABS
650.0000 mg | ORAL_TABLET | ORAL | Status: DC | PRN
  Administered 2020-07-16 – 2020-07-22 (×4): 650 mg via ORAL
  Filled 2020-07-16 (×4): qty 2

## 2020-07-16 MED ORDER — FUROSEMIDE 20 MG PO TABS
40.0000 mg | ORAL_TABLET | Freq: Every morning | ORAL | Status: DC
  Filled 2020-07-16: qty 2

## 2020-07-16 MED ORDER — SODIUM CHLORIDE 0.9 % IV BOLUS
1000.0000 mL | Freq: Once | INTRAVENOUS | Status: AC
  Administered 2020-07-16: 1000 mL via INTRAVENOUS

## 2020-07-16 MED ORDER — MORPHINE SULFATE (PF) 4 MG/ML IV SOLN
3.0000 mg | INTRAVENOUS | Status: AC | PRN
  Administered 2020-07-18 – 2020-07-19 (×2): 3 mg via INTRAVENOUS
  Filled 2020-07-16 (×2): qty 1

## 2020-07-16 MED ORDER — HEPARIN (PORCINE) 25000 UT/250ML-% IV SOLN
1600.0000 [IU]/h | INTRAVENOUS | Status: DC
  Administered 2020-07-16 – 2020-07-18 (×3): 1600 [IU]/h via INTRAVENOUS
  Filled 2020-07-16 (×4): qty 250

## 2020-07-16 MED ORDER — AMLODIPINE BESYLATE 5 MG PO TABS: 10.0000 mg | ORAL_TABLET | Freq: Every day | ORAL | Status: DC

## 2020-07-16 MED ORDER — POTASSIUM CHLORIDE CRYS ER 20 MEQ PO TBCR
20.0000 meq | EXTENDED_RELEASE_TABLET | Freq: Two times a day (BID) | ORAL | Status: DC
  Administered 2020-07-16 – 2020-07-25 (×18): 20 meq via ORAL
  Filled 2020-07-16 (×18): qty 1

## 2020-07-16 MED ORDER — ASPIRIN EC 81 MG PO TBEC
81.0000 mg | DELAYED_RELEASE_TABLET | Freq: Every day | ORAL | Status: DC
  Administered 2020-07-17 – 2020-07-25 (×8): 81 mg via ORAL
  Filled 2020-07-16 (×8): qty 1

## 2020-07-16 MED ORDER — FAMOTIDINE 20 MG PO TABS
40.0000 mg | ORAL_TABLET | Freq: Every day | ORAL | Status: DC | PRN
  Administered 2020-07-17 – 2020-07-19 (×3): 40 mg via ORAL
  Filled 2020-07-16 (×3): qty 2

## 2020-07-16 MED ORDER — NITROGLYCERIN 0.4 MG SL SUBL: 0.4000 mg | SUBLINGUAL_TABLET | SUBLINGUAL | Status: DC | PRN

## 2020-07-16 MED ORDER — PANTOPRAZOLE SODIUM 40 MG PO TBEC
40.0000 mg | DELAYED_RELEASE_TABLET | Freq: Two times a day (BID) | ORAL | Status: DC
  Administered 2020-07-16 – 2020-07-25 (×18): 40 mg via ORAL
  Filled 2020-07-16 (×18): qty 1

## 2020-07-16 MED ORDER — PROMETHAZINE HCL 25 MG/ML IJ SOLN: 12.5000 mg | Freq: Four times a day (QID) | INTRAMUSCULAR | Status: DC | PRN

## 2020-07-16 NOTE — Progress Notes (Signed)
PROGRESS NOTE  [name]  DOB: 1941/01/24  PCP: Kathyrn Lass SEG:[id number]  DOA: 07/15/2020  LOS: 0 days   Chief Complaint  Patient presents with  . Headache  . Nausea   Brief narrative: [name] is a 79 y.o. female with PMH of morbid obesity, sleep apnea, HTN, HLD, CAD/MI, diastolic CHF, paroxysmal A. 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  The pain lasted for 10 to 15 minutes and hence she called EMS.   She had a cardiac catheterization at the beginning of August of this year that showed a mid LAD 30% stenosis.   Reports compliance to all medications. She denies any prolonged immobilization.  She denies any aggravating factors for the chest pressure.  She states she does not think to take her nitroglycerin at home when she started having the chest pain.  In the ED, patient had a temperature of 100.8, blood pressure 153/89, heart rate in 60s, breathing room air Labs with troponin initially at 9 and subsequently increased to 39 in 2 hours Creatinine elevated 1.74, WBC count elevated to 11.4 CT abdomen/pelvis did not show any acute intraabdominal pathology but showed new groundglass opacities in both lung bases Covid PCR negative EKG with no significant ST-T wave changes, QTC prolonged at 508 ms  Subjective: Patient was seen and examined . Pleasant elderly African-American female. Not having chest pain at the time of my evaluation. However this morning, she is nonfebrile and blood pressures running low.    Assessment/Plan: Severe sepsis with hypotension-POA Bibasilar pneumonia/possible UTI Impending septic shock -Fever of 100.8 with tachypnea.  Chest imaging with new bibasilar groundglass opacities -Urinalysis with yellow hazy urine, moderate amount of leukocytes and  many bacteria. -Fever up to 102 this morning.   -Start sepsis protocol.  Send blood culture.  Start on IV Rocephin and doxycycline -Fluid bolus started.  Blood pressure persistently running low in 70s this morning.  Lactic acid level elevated.  Fluid bolus given. -Critical care consult called. Recent Labs  Lab 07/15/20 2111 07/16/20 0755 07/16/20 0851  WBC 11.4* 17.0*  --   LATICACIDVEN  --   --  2.8*  PROCALCITON  --   --  17.03   NSTEMI (non-ST elevated myocardial infarction)  -Presented with sudden onset chest pain at rest.  Has significant cardiac history.  -Troponin initially 9, subsequently rose to 849.  -Patient is currently on heparin drip which I will continue.  -Patient sees cardiologist Dr. Doylene Canard as an outpatient.  Consultation called -Continue aspirin, statin.  Recent Labs    07/15/20 2111 07/15/20 2311 07/16/20 0755  TROPONINIHS 9 81* 74*   CAD/ MI  HLD -She had a cardiac catheterization at the beginning of August of this year that showed a mid LAD 30% stenosis. -Continue aspirin and statin for now -Home meds include metoprolol, amlodipine, Lasix, metolazone, hydralazine, isosorbide, losartan. -Currently blood pressure is running on lower side normal. -Keep blood pressure medicines on hold.  h/o paroxysmal atrial fibrillation.  -Continue amiodarone, Eliquis.  Prolonged QT interval -QTC 500 ms  -On amiodarone.  Monitor K and mag level.    History of second-degree HB -Pacemaker in place  Morbid obesity - Body mass index is 53.42 kg/m. Patient has been advised to make an attempt to improve diet and exercise patterns to aid in weight loss.  Obstructive sleep apnea -Reportedly noncompliant to CPAP  Mobility: Encourage ambulation after hemodynamic stability Code Status:   Code Status: Full Code  Nutritional status: Body mass index is 53.42 kg/m.     Diet Order            Diet Heart Room service appropriate? Yes; Fluid consistency: Thin  Diet  effective now                 DVT prophylaxis: Heparin drip Antimicrobials:  IV Rocephin and doxycycline Fluid: Fluid boluses per sepsis protocol Consultants: Critical care, cardiology Family Communication:  None at bedside  Status is: Inpatient  Remains inpatient appropriate because:Hemodynamically unstable   Dispo: The patient is from: Home              Anticipated d/c is to: Home hopefully              Anticipated d/c date is: > 3 days              Patient currently is not medically stable to d/c.       Infusions:  . sodium chloride    . cefTRIAXone (ROCEPHIN)  IV Stopped (07/16/20 0933)  . heparin 1,600 Units/hr (07/16/20 0257)    Scheduled Meds: . amiodarone  200 mg Oral Daily  . aspirin EC  81 mg Oral Daily  . [START ON 07/17/2020] aspirin EC  81 mg Oral Daily  . doxycycline  100 mg Oral Q12H  . rosuvastatin  10 mg Oral QHS    Antimicrobials: Anti-infectives (From admission, onward)   Start     Dose/Rate Route Frequency Ordered Stop   07/16/20 1000  doxycycline (VIBRA-TABS) tablet 100 mg        100 mg Oral Every 12 hours 07/16/20 0830     07/16/20 0830  cefTRIAXone (ROCEPHIN) 1 g in sodium chloride 0.9 % 100 mL IVPB        1 g 200 mL/hr over 30 Minutes Intravenous Every 24 hours 07/16/20 0826     07/16/20 0830  azithromycin (ZITHROMAX) 500 mg in sodium chloride 0.9 % 250 mL IVPB  Status:  Discontinued        500 mg 250 mL/hr over 60 Minutes Intravenous Every 24 hours 07/16/20 0826 07/16/20 0830      PRN meds: acetaminophen, albuterol, famotidine, nitroGLYCERIN, promethazine   Objective: Vitals:   07/16/20 1100 07/16/20 1115  BP: (!) 90/54 (!) 90/56  Pulse:    Resp: 19 19  Temp:    SpO2:      Intake/Output Summary (Last 24 hours) at 07/16/2020 1142 Last data filed at 07/16/2020 1056 Gross per 24 hour  Intake 2203.33 ml  Output --  Net 2203.33 ml   Filed Weights   07/15/20 2115  Weight: (!) 150.1 kg   Weight change:  Body mass index is  53.42 kg/m.   Physical Exam: General exam: Appears calm and comfortable.  Skin: No rashes, lesions or ulcers. HEENT: Atraumatic, normocephalic, supple neck, no obvious bleeding Lungs: Clear to auscultation bilaterally CVS: Regular rate and rhythm GI/Abd soft, nontender, nondistended, bowel sound present CNS: Alert, awake, oriented to place and person Psychiatry: Mood appropriate Extremities: Chronic bilateral lower extremity lymphedema with stasis changes  Data Review: I have personally reviewed the laboratory data and studies available.  Recent Labs  Lab 07/15/20 2111 07/16/20 0755  WBC 11.4* 17.0*  HGB 12.2 12.1  HCT 38.8 39.2  MCV 100.8* 102.3*  PLT 203 172   Recent Labs  Lab  07/15/20 2111 07/16/20 0755  NA 139 142  K 3.8 4.3  CL 105 107  CO2 24 21*  GLUCOSE 128* 130*  BUN 20 23  CREATININE 1.74* 2.42*  CALCIUM 8.6* 8.2*    F/u labs ordered  Signed, Terrilee Croak, MD [facility] 07/16/2020

## 2020-07-16 NOTE — ED Provider Notes (Signed)
Care assumed from Dr. Gilford Raid, patient with chest and abdominal discomfort and headache pending results of CT head, CT abdomen and pelvis as well as repeat troponin.  CT head is unremarkable.  CT abdomen pelvis shows groundglass densities in the lung bases compatible with COVID-19, but COVID-19 PCR test is negative.  Repeat troponin has shown a significant increase from 9 to 39.  She is still complaining of some chest discomfort as well as nausea.  She is given prochlorperazine, oral aspirin and is started on heparin.  Case is discussed with Dr. Laurier Nancy of [facility], who agrees to admit the patient.  CRITICAL CARE Performed by: Delora Fuel Total critical care time: 35 minutes Critical care time was exclusive of separately billable procedures and treating other patients. Critical care was necessary to treat or prevent imminent or life-threatening deterioration. Critical care was time spent personally by me on the following activities: development of treatment plan with patient and/or surrogate as well as nursing, discussions with consultants, evaluation of patient's response to treatment, examination of patient, obtaining history from patient or surrogate, ordering and performing treatments and interventions, ordering and review of laboratory studies, ordering and review of radiographic studies, pulse oximetry and re-evaluation of patient's condition.  Results for orders placed or performed during the hospital encounter of 07/15/20  SARS Coronavirus 2 by RT PCR (hospital order, performed in [facility] hospital lab) Nasopharyngeal Nasopharyngeal Swab   Specimen: Nasopharyngeal Swab  Result Value Ref Range   SARS Coronavirus 2 NEGATIVE NEGATIVE  CBC  Result Value Ref Range   WBC 11.4 (H) 4.0 - 10.5 K/uL   RBC 3.85 (L) 3.87 - 5.11 MIL/uL   Hemoglobin 12.2 12.0 - 15.0 g/dL   HCT 38.8 36 - 46 %   MCV 100.8 (H) 80.0 - 100.0 fL   MCH 31.7 26.0 - 34.0 pg   MCHC 31.4 30.0 - 36.0 g/dL   RDW  16.2 (H) 11.5 - 15.5 %   Platelets 203 150 - 400 K/uL   nRBC 0.0 0.0 - 0.2 %  Comprehensive metabolic panel  Result Value Ref Range   Sodium 139 135 - 145 mmol/L   Potassium 3.8 3.5 - 5.1 mmol/L   Chloride 105 98 - 111 mmol/L   CO2 24 22 - 32 mmol/L   Glucose, Bld 128 (H) 70 - 99 mg/dL   BUN 20 8 - 23 mg/dL   Creatinine, Ser 1.74 (H) 0.44 - 1.00 mg/dL   Calcium 8.6 (L) 8.9 - 10.3 mg/dL   Total Protein 7.3 6.5 - 8.1 g/dL   Albumin 3.5 3.5 - 5.0 g/dL   AST 22 15 - 41 U/L   ALT 13 0 - 44 U/L   Alkaline Phosphatase 75 38 - 126 U/L   Total Bilirubin 0.7 0.3 - 1.2 mg/dL   GFR calc non Af Amer 28 (L) >60 mL/min   GFR calc Af Amer 32 (L) >60 mL/min   Anion gap 10 5 - 15  Lipase, blood  Result Value Ref Range   Lipase 18 11 - 51 U/L  Troponin I (High Sensitivity)  Result Value Ref Range   Troponin I (High Sensitivity) 9 <18 ng/L  Troponin I (High Sensitivity)  Result Value Ref Range   Troponin I (High Sensitivity) 39 (H) <18 ng/L   CT ABDOMEN PELVIS WO CONTRAST  Result Date: 07/16/2020 CLINICAL DATA:  Generalized abdominal pain with nausea and vomiting EXAM: CT ABDOMEN AND PELVIS WITHOUT CONTRAST TECHNIQUE: Multidetector CT imaging of the abdomen and pelvis  was performed following the standard protocol without IV contrast. COMPARISON:  12/02/2019 FINDINGS: Lower chest: Lung bases demonstrate some mild ground-glass opacities new from the prior exam. Correlate with COVID-19 testing. Hepatobiliary: No focal liver abnormality is seen. No gallstones, gallbladder wall thickening, or biliary dilatation. Pancreas: Pancreas demonstrates fatty replacement. No mass lesion is seen. Spleen: Normal in size without focal abnormality. Adrenals/Urinary Tract: Adrenal glands are unremarkable. Kidneys demonstrate no renal calculi or obstructive change. Right renal cyst is again noted. The bladder is well distended. Stomach/Bowel: Scattered diverticular change of the colon is noted. No obstructive changes are  seen. The appendix is within normal limits. Small bowel and stomach are unremarkable with the exception of a sliding-type hiatal hernia stable from the prior study. Vascular/Lymphatic: Aortic atherosclerosis. No enlarged abdominal or pelvic lymph nodes. Reproductive: Status post hysterectomy. No adnexal masses. Other: No abdominal wall hernia or abnormality. No abdominopelvic ascites. Musculoskeletal: Degenerative changes of lumbar spine are noted. IMPRESSION: New ground-glass opacities in the lung bases. Correlate with COVID-19 testing which is pending. Chronic changes stable from the prior study. Electronically Signed   By: Inez Catalina M.D.   On: 07/16/2020 00:39   CT Head Wo Contrast  Result Date: 07/15/2020 CLINICAL DATA:  New onset headaches EXAM: CT HEAD WITHOUT CONTRAST TECHNIQUE: Contiguous axial images were obtained from the base of the skull through the vertex without intravenous contrast. COMPARISON:  01/06/2017 FINDINGS: Brain: No evidence of acute infarction, hemorrhage, hydrocephalus, extra-axial collection or mass lesion/mass effect. Mild chronic white matter ischemic change is again seen and stable. Vascular: No hyperdense vessel or unexpected calcification. Skull: Normal. Negative for fracture or focal lesion. Sinuses/Orbits: No acute finding. Other: None. IMPRESSION: Chronic ischemic changes without acute abnormality. Electronically Signed   By: Inez Catalina M.D.   On: 07/15/2020 23:09   DG Chest Port 1 View  Result Date: 07/15/2020 CLINICAL DATA:  Headache, vomiting, chest pain EXAM: PORTABLE CHEST 1 VIEW COMPARISON:  05/22/2020 FINDINGS: Single frontal view of the chest demonstrates stable dual lead pacemaker. The cardiac silhouette is enlarged. There is increased central vascular congestion, without airspace disease, effusion, or pneumothorax. No acute bony abnormalities. IMPRESSION: 1. Central vascular congestion without overt edema. 2. Stable enlarged cardiac silhouette.  Electronically Signed   By: Randa Ngo M.D.   On: 93/79/0240 97:35      Delora Fuel, MD 32/99/24 [id number]

## 2020-07-16 NOTE — Plan of Care (Signed)
  Problem: Education: Goal: Knowledge of General Education information will improve Description: Including pain rating scale, medication(s)/side effects and non-pharmacologic comfort measures Outcome: Progressing   Problem: Clinical Measurements: Goal: Ability to maintain clinical measurements within normal limits will improve Outcome: Progressing   Problem: Clinical Measurements: Goal: Diagnostic test results will improve Outcome: Progressing   Problem: Coping: Goal: Level of anxiety will decrease Outcome: Progressing   Problem: Pain Managment: Goal: General experience of comfort will improve Outcome: Progressing   Problem: Skin Integrity: Goal: Risk for impaired skin integrity will decrease Outcome: Progressing

## 2020-07-16 NOTE — ED Notes (Signed)
Magda Paganini from [facility] calling to report on pacemaker, per Magda Paganini, no episodes noted to pacemaker and battery at full charge.

## 2020-07-16 NOTE — Consult Note (Signed)
Referring Physician:  [name] is an 79 y.o. female.                       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 She has elevated Troponin I. EKG shows sinus rhythm with 1st. Degree AV block and LBBB without significant ST elevations. CXR showed central vascular congestion. CT of abdomen showed ground glass opacities in both lung bases. Patient is on Eliquis 5 mg. Twice daily.  Past Medical History:  Diagnosis Date  . Arthritis   . CHF (congestive heart failure) ([place])    05/2011 echo severe concentric LVH, normal systolic fxn, grade 1 diastolic dysfxn, paradoxical septal motion  . Coronary artery disease    Cath 04/2006 normal coronaries, mild-mod slow flow in all coronaries with slow distall runoff, mild-mod LV enlargement, EF 55%   . Depression   . Dyspnea   . Dysrhythmia   . GERD (gastroesophageal reflux disease)   . Hypercholesterolemia   . Hyperlipemia   . Hypertension   . MI (myocardial infarction) ([facility])    NOT SURE HOW MANY  . Morbid obesity ([place])   . Nocturia   . 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 Peripheral vascular disease ([place])   . Pneumonia NONE RECENT  . Sleep apnea    needs cpap but does not wear  . Stroke ([place])   . 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 Vertigo    takes meclizine daily      Past Surgical History:  Procedure Laterality Date  . ABDOMINAL HYSTERECTOMY    . CARDIAC CATHETERIZATION    . CATARACT EXTRACTION W/PHACO Left 08/29/2013   Procedure: CATARACT EXTRACTION PHACO AND INTRAOCULAR LENS PLACEMENT (IOC);  Surgeon: Adonis Brook, MD;  Location: [facility];  Service: Ophthalmology;  Laterality: Left;  . COLONOSCOPY WITH PROPOFOL N/A 01/07/2014    Procedure: COLONOSCOPY WITH PROPOFOL;  Surgeon: Juanita Craver, MD;  Location: [facility];  Service: Endoscopy;  Laterality: N/A;  . CORONARY ANGIOGRAPHY N/A 05/27/2020   Procedure: CORONARY ANGIOGRAPHY;  Surgeon: Dixie Dials, MD;  Location: [facility];  Service: Cardiovascular;  Laterality: N/A;  . DILATION AND CURETTAGE OF UTERUS    . INSERT / REPLACE / REMOVE PACEMAKER    . PACEMAKER GENERATOR CHANGE N/A 03/27/2014   Procedure: PACEMAKER GENERATOR CHANGE;  Surgeon: Sanda Klein, MD;  Location: [facility];  Service: Cardiovascular;  Laterality: N/A;  . PACEMAKER INSERTION    . PACEMAKER INSERTION    . TUBAL LIGATION    . VASCULAR SURGERY     vericose vein surgery    History reviewed. No pertinent family history. Social History:  reports that she has never smoked. She has never used smokeless tobacco. She reports that she does not drink alcohol and does not use drugs.  Allergies:  Allergies  Allergen Reactions  . Ampicillin Other (See Comments)    Increases heart rhythm   . Penicillins Other (See Comments)    Increases heart rhythm   . Linzess [Linaclotide] Other (See Comments)    Severe stomach pains    (Not in a hospital admission)   Results for orders placed or performed during the hospital encounter of 07/15/20 (from the  past 48 hour(s))  CBC     Status: Abnormal   Collection Time: 07/15/20  9:11 PM  Result Value Ref Range   WBC 11.4 (H) 4.0 - 10.5 K/uL   RBC 3.85 (L) 3.87 - 5.11 MIL/uL   Hemoglobin 12.2 12.0 - 15.0 g/dL   HCT 38.8 36 - 46 %   MCV 100.8 (H) 80.0 - 100.0 fL   MCH 31.7 26.0 - 34.0 pg   MCHC 31.4 30.0 - 36.0 g/dL   RDW 16.2 (H) 11.5 - 15.5 %   Platelets 203 150 - 400 K/uL   nRBC 0.0 0.0 - 0.2 %    Comment: Performed at [facility] [address] [place], [place] [address]  Troponin I (High Sensitivity)     Status: None   Collection Time: 07/15/20  9:11 PM  Result Value Ref Range   Troponin I (High Sensitivity) 9 <18 ng/L     Comment: (NOTE) Elevated high sensitivity troponin I (hsTnI) values and significant  changes across serial measurements may suggest ACS but many other  chronic and acute conditions are known to elevate hsTnI results.  Refer to the "Links" section for chest pain algorithms and additional  guidance. Performed at [facility], [place] [address], [place], [place] [address]   Comprehensive metabolic panel     Status: Abnormal   Collection Time: 07/15/20  9:11 PM  Result Value Ref Range   Sodium 139 135 - 145 mmol/L   Potassium 3.8 3.5 - 5.1 mmol/L   Chloride 105 98 - 111 mmol/L   CO2 24 22 - 32 mmol/L   Glucose, Bld 128 (H) 70 - 99 mg/dL    Comment: Glucose reference range applies only to samples taken after fasting for at least 8 hours.   BUN 20 8 - 23 mg/dL   Creatinine, Ser 1.74 (H) 0.44 - 1.00 mg/dL   Calcium 8.6 (L) 8.9 - 10.3 mg/dL   Total Protein 7.3 6.5 - 8.1 g/dL   Albumin 3.5 3.5 - 5.0 g/dL   AST 22 15 - 41 U/L   ALT 13 0 - 44 U/L   Alkaline Phosphatase 75 38 - 126 U/L   Total Bilirubin 0.7 0.3 - 1.2 mg/dL   GFR calc non Af Amer 28 (L) >60 mL/min   GFR calc Af Amer 32 (L) >60 mL/min   Anion gap 10 5 - 15    Comment: Performed at [facility] [address] [place], [place] [address]  Lipase, blood     Status: None   Collection Time: 07/15/20  9:41 PM  Result Value Ref Range   Lipase 18 11 - 51 U/L    Comment: Performed at [facility] [address] [place], [place] [address]  SARS Coronavirus 2 by RT PCR (hospital order, performed in [facility] hospital lab) Nasopharyngeal Nasopharyngeal Swab     Status: None   Collection Time: 07/15/20 11:02 PM   Specimen: Nasopharyngeal Swab  Result Value Ref Range   SARS Coronavirus 2 NEGATIVE NEGATIVE    Comment: (NOTE) SARS-CoV-2 target nucleic acids are NOT DETECTED.  The SARS-CoV-2 RNA is generally detectable in upper and lower respiratory specimens during the acute phase of infection. The  lowest concentration of SARS-CoV-2 viral copies this assay can detect is 250 copies / mL. A negative result does not preclude SARS-CoV-2 infection and should not be used as the sole basis for treatment or other patient management decisions.  A  negative result may occur with improper specimen collection / handling, submission of specimen other than nasopharyngeal swab, presence of viral mutation(s) within the areas targeted by this assay, and inadequate number of viral copies (<250 copies / mL). A negative result must be combined with clinical observations, patient history, and epidemiological information.  Fact Sheet for Patients:   StrictlyIdeas.no  Fact Sheet for Healthcare Providers: BankingDealers.co.za  This test is not yet approved or  cleared by the [place] FDA and has been authorized for detection and/or diagnosis of SARS-CoV-2 by FDA under an Emergency Use Authorization (EUA).  This EUA will remain in effect (meaning this test can be used) for the duration of the COVID-19 declaration under Section 564(b)(1) of the Act, 21 U.S.C. section 360bbb-3(b)(1), unless the authorization is terminated or revoked sooner.  Performed at [facility], [place] [address] [place], [place] [address]   Troponin I (High Sensitivity)     Status: Abnormal   Collection Time: 07/15/20 11:11 PM  Result Value Ref Range   Troponin I (High Sensitivity) 39 (H) <18 ng/L    Comment: RESULT CALLED TO, READ BACK BY AND VERIFIED WITH: Florentina Jenny RN [id number] [phone] M GARRETT (NOTE) Elevated high sensitivity troponin I (hsTnI) values and significant  changes across serial measurements may suggest ACS but many other  chronic and acute conditions are known to elevate hsTnI results.  Refer to the Links section for chest pain algorithms and additional  guidance. Performed at [facility], [place] [address], [place], [place] [address]   Troponin I  (High Sensitivity)     Status: Abnormal   Collection Time: 07/16/20  7:55 AM  Result Value Ref Range   Troponin I (High Sensitivity) 849 (HH) <18 ng/L    Comment: CRITICAL RESULT CALLED TO, READ BACK BY AND VERIFIED WITH: BERTRAND,S RN @1120  ON [id number] BY FLEMINGS (NOTE) Elevated high sensitivity troponin I (hsTnI) values and significant  changes across serial measurements may suggest ACS but many other  chronic and acute conditions are known to elevate hsTnI results.  Refer to the Links section for chest pain algorithms and additional  guidance. Performed at [facility], [place] [address] [place], [place] [address]   Basic metabolic panel     Status: Abnormal   Collection Time: 07/16/20  7:55 AM  Result Value Ref Range   Sodium 142 135 - 145 mmol/L   Potassium 4.3 3.5 - 5.1 mmol/L   Chloride 107 98 - 111 mmol/L   CO2 21 (L) 22 - 32 mmol/L   Glucose, Bld 130 (H) 70 - 99 mg/dL    Comment: Glucose reference range applies only to samples taken after fasting for at least 8 hours.   BUN 23 8 - 23 mg/dL   Creatinine, Ser 2.42 (H) 0.44 - 1.00 mg/dL   Calcium 8.2 (L) 8.9 - 10.3 mg/dL   GFR calc non Af Amer 19 (L) >60 mL/min   GFR calc Af Amer 22 (L) >60 mL/min   Anion gap 14 5 - 15    Comment: Performed at [facility] [address], [place], [place] [address]  Lipid panel     Status: None   Collection Time: 07/16/20  7:55 AM  Result Value Ref Range   Cholesterol 100 0 - 200 mg/dL   Triglycerides 44 <150 mg/dL   HDL 48 >40 mg/dL   Total CHOL/HDL Ratio 2.1 RATIO   VLDL 9 0 - 40 mg/dL   LDL  Cholesterol 43 0 - 99 mg/dL    Comment:        Total Cholesterol/HDL:CHD Risk Coronary Heart Disease Risk Table                     Men   Women  1/2 Average Risk   3.4   3.3  Average Risk       5.0   4.4  2 X Average Risk   9.6   7.1  3 X Average Risk  23.4   11.0        Use the calculated Patient Ratio above and the CHD Risk Table to determine the patient's CHD Risk.         ATP III CLASSIFICATION (LDL):  <100     mg/dL   Optimal  100-129  mg/dL   Near or Above                    Optimal  130-159  mg/dL   Borderline  160-189  mg/dL   High  >190     mg/dL   Very High Performed at [facility] [address], [place], [place] [address]   CBC     Status: Abnormal   Collection Time: 07/16/20  7:55 AM  Result Value Ref Range   WBC 17.0 (H) 4.0 - 10.5 K/uL   RBC 3.83 (L) 3.87 - 5.11 MIL/uL   Hemoglobin 12.1 12.0 - 15.0 g/dL   HCT 39.2 36 - 46 %   MCV 102.3 (H) 80.0 - 100.0 fL   MCH 31.6 26.0 - 34.0 pg   MCHC 30.9 30.0 - 36.0 g/dL   RDW 16.8 (H) 11.5 - 15.5 %   Platelets 172 150 - 400 K/uL   nRBC 0.0 0.0 - 0.2 %    Comment: Performed at [facility], [place] [address] [place], [place] [address]  Lactic acid, plasma     Status: Abnormal   Collection Time: 07/16/20  8:51 AM  Result Value Ref Range   Lactic Acid, Venous 2.8 (HH) 0.5 - 1.9 mmol/L    Comment: CRITICAL RESULT CALLED TO, READ BACK BY AND VERIFIED WITH: BLUE,J RN @0943  ON [id number] BY FLEMINGS Performed at [facility] [address], [place], [place] [address]   Culture, blood (routine x 2)     Status: None (Preliminary result)   Collection Time: 07/16/20  8:51 AM   Specimen: BLOOD  Result Value Ref Range   Specimen Description BLOOD RIGHT ANTECUBITAL    Special Requests      BOTTLES DRAWN AEROBIC AND ANAEROBIC Blood Culture adequate volume   Culture      NO GROWTH < 12 HOURS Performed at [facility], [place] [address], [place], [place] [address]    Report Status PENDING   Culture, blood (routine x 2)     Status: None (Preliminary result)   Collection Time: 07/16/20  8:51 AM   Specimen: BLOOD  Result Value Ref Range   Specimen Description BLOOD LEFT ANTECUBITAL    Special Requests      BOTTLES DRAWN AEROBIC AND ANAEROBIC Blood Culture adequate volume   Culture      NO GROWTH < 12 HOURS Performed at [facility], [place] [address] [place], [place]  [address]    Report Status PENDING   Procalcitonin - Baseline     Status: None   Collection Time: 07/16/20  8:51 AM  Result Value  Ref Range   Procalcitonin 17.03 ng/mL    Comment:        Interpretation: PCT >= 10 ng/mL: Important systemic inflammatory response, almost exclusively due to severe bacterial sepsis or septic shock. (NOTE)       Sepsis PCT Algorithm           Lower Respiratory Tract                                      Infection PCT Algorithm    ----------------------------     ----------------------------         PCT < 0.25 ng/mL                PCT < 0.10 ng/mL          Strongly encourage             Strongly discourage   discontinuation of antibiotics    initiation of antibiotics    ----------------------------     -----------------------------       PCT 0.25 - 0.50 ng/mL            PCT 0.10 - 0.25 ng/mL               OR       >80% decrease in PCT            Discourage initiation of                                            antibiotics      Encourage discontinuation           of antibiotics    ----------------------------     -----------------------------         PCT >= 0.50 ng/mL              PCT 0.26 - 0.50 ng/mL                AND       <80% decrease in PCT             Encourage initiation of                                             antibiotics       Encourage continuation           of antibiotics    ----------------------------     -----------------------------        PCT >= 0.50 ng/mL                  PCT > 0.50 ng/mL               AND         increase in PCT                  Strongly encourage                                      initiation of antibiotics    Strongly encourage escalation  of antibiotics                                     -----------------------------                                           PCT <= 0.25 ng/mL                                                 OR                                        > 80% decrease in PCT                                       Discontinue / Do not initiate                                             antibiotics  Performed at [facility], [place] [address], [place], [place] [address]   Urinalysis, Routine w reflex microscopic Urine, Clean Catch     Status: Abnormal   Collection Time: 07/16/20  9:48 AM  Result Value Ref Range   Color, Urine YELLOW YELLOW   APPearance HAZY (A) CLEAR   Specific Gravity, Urine 1.015 1.005 - 1.030   pH 5.0 5.0 - 8.0   Glucose, UA NEGATIVE NEGATIVE mg/dL   Hgb urine dipstick NEGATIVE NEGATIVE   Bilirubin Urine NEGATIVE NEGATIVE   Ketones, ur NEGATIVE NEGATIVE mg/dL   Protein, ur NEGATIVE NEGATIVE mg/dL   Nitrite NEGATIVE NEGATIVE   Leukocytes,Ua MODERATE (A) NEGATIVE   RBC / HPF 0-5 0 - 5 RBC/hpf   WBC, UA 6-10 0 - 5 WBC/hpf   Bacteria, UA MANY (A) NONE SEEN   Squamous Epithelial / LPF 0-5 0 - 5   Hyaline Casts, UA PRESENT     Comment: Performed at [facility], [address], [place], [place] [address]  Heparin level (unfractionated)     Status: Abnormal   Collection Time: 07/16/20 10:42 AM  Result Value Ref Range   Heparin Unfractionated 1.84 (H) 0.30 - 0.70 IU/mL    Comment: RESULTS CONFIRMED BY MANUAL DILUTION (NOTE) If heparin results are below expected values, and patient dosage has  been confirmed, suggest follow up testing of antithrombin III levels. Performed at [facility], [place] [address], [place], [place] [address]   APTT     Status: Abnormal   Collection Time: 07/16/20 10:42 AM  Result Value Ref Range   aPTT 77 (H) 24 - 36 seconds    Comment:        IF BASELINE aPTT IS ELEVATED, SUGGEST PATIENT RISK ASSESSMENT BE USED TO DETERMINE APPROPRIATE ANTICOAGULANT THERAPY. Performed at [facility], [place] [address], [place], [place] [address]   Troponin I (High Sensitivity)  Status: Abnormal   Collection Time: 07/16/20 10:42 AM  Result Value Ref Range   Troponin I (High Sensitivity) 813 (HH) <18 ng/L     Comment: CRITICAL VALUE NOTED.  VALUE IS CONSISTENT WITH PREVIOUSLY REPORTED AND CALLED VALUE. (NOTE) Elevated high sensitivity troponin I (hsTnI) values and significant  changes across serial measurements may suggest ACS but many other  chronic and acute conditions are known to elevate hsTnI results.  Refer to the Links section for chest pain algorithms and additional  guidance. Performed at [facility], [place] [address] [place], [place] [address]   Troponin I (High Sensitivity)     Status: Abnormal   Collection Time: 07/16/20 12:10 PM  Result Value Ref Range   Troponin I (High Sensitivity) 925 (HH) <18 ng/L    Comment: CRITICAL VALUE NOTED.  VALUE IS CONSISTENT WITH PREVIOUSLY REPORTED AND CALLED VALUE. (NOTE) Elevated high sensitivity troponin I (hsTnI) values and significant  changes across serial measurements may suggest ACS but many other  chronic and acute conditions are known to elevate hsTnI results.  Refer to the Links section for chest pain algorithms and additional  guidance. Performed at [facility], [place] [address] [place], [place] [address]    CT ABDOMEN PELVIS WO CONTRAST  Result Date: 07/16/2020 CLINICAL DATA:  Generalized abdominal pain with nausea and vomiting EXAM: CT ABDOMEN AND PELVIS WITHOUT CONTRAST TECHNIQUE: Multidetector CT imaging of the abdomen and pelvis was performed following the standard protocol without IV contrast. COMPARISON:  12/02/2019 FINDINGS: Lower chest: Lung bases demonstrate some mild ground-glass opacities new from the prior exam. Correlate with COVID-19 testing. Hepatobiliary: No focal liver abnormality is seen. No gallstones, gallbladder wall thickening, or biliary dilatation. Pancreas: Pancreas demonstrates fatty replacement. No mass lesion is seen. Spleen: Normal in size without focal abnormality. Adrenals/Urinary Tract: Adrenal glands are unremarkable. Kidneys demonstrate no renal calculi or obstructive change. Right renal  cyst is again noted. The bladder is well distended. Stomach/Bowel: Scattered diverticular change of the colon is noted. No obstructive changes are seen. The appendix is within normal limits. Small bowel and stomach are unremarkable with the exception of a sliding-type hiatal hernia stable from the prior study. Vascular/Lymphatic: Aortic atherosclerosis. No enlarged abdominal or pelvic lymph nodes. Reproductive: Status post hysterectomy. No adnexal masses. Other: No abdominal wall hernia or abnormality. No abdominopelvic ascites. Musculoskeletal: Degenerative changes of lumbar spine are noted. IMPRESSION: New ground-glass opacities in the lung bases. Correlate with COVID-19 testing which is pending. Chronic changes stable from the prior study. Electronically Signed   By: Inez Catalina M.D.   On: 07/16/2020 00:39   CT Head Wo Contrast  Result Date: 07/15/2020 CLINICAL DATA:  New onset headaches EXAM: CT HEAD WITHOUT CONTRAST TECHNIQUE: Contiguous axial images were obtained from the base of the skull through the vertex without intravenous contrast. COMPARISON:  01/06/2017 FINDINGS: Brain: No evidence of acute infarction, hemorrhage, hydrocephalus, extra-axial collection or mass lesion/mass effect. Mild chronic white matter ischemic change is again seen and stable. Vascular: No hyperdense vessel or unexpected calcification. Skull: Normal. Negative for fracture or focal lesion. Sinuses/Orbits: No acute finding. Other: None. IMPRESSION: Chronic ischemic changes without acute abnormality. Electronically Signed   By: Inez Catalina M.D.   On: 07/15/2020 23:09   DG Chest Port 1 View  Result Date: 07/15/2020 CLINICAL DATA:  Headache, vomiting, chest pain EXAM: PORTABLE CHEST 1 VIEW COMPARISON:  05/22/2020 FINDINGS: Single frontal view of the chest demonstrates stable dual lead pacemaker. The cardiac silhouette is  enlarged. There is increased central vascular congestion, without airspace disease, effusion, or  pneumothorax. No acute bony abnormalities. IMPRESSION: 1. Central vascular congestion without overt edema. 2. Stable enlarged cardiac silhouette. Electronically Signed   By: Randa Ngo M.D.   On: 07/15/2020 21:25    Review Of Systems Constitutional: No fever, chills, weight loss or gain. Eyes: No vision change, wears glasses. No discharge or pain. Ears: No hearing loss, No tinnitus. Respiratory:  asthma, COPD, pneumonias. Positive shortness of breath. No hemoptysis. Cardiovascular: Positive chest pain, palpitation, leg edema. Gastrointestinal: No nausea, vomiting, diarrhea, constipation. No GI bleed. No hepatitis. Genitourinary: No dysuria, hematuria, kidney stone. No incontinance. Neurological: Positive headache, stroke, seizures.  Psychiatry: No psych facility admission for anxiety, depression, suicide. No detox. Skin: No rash. Musculoskeletal: Positive joint pain, fibromyalgia, neck pain, back pain. Lymphadenopathy: No lymphadenopathy. Hematology: No anemia or easy bruising.   Blood pressure (!) 84/50, pulse 62, temperature 99.8 F (37.7 C), temperature source Oral, resp. rate 16, height 5\' 6"  (1.676 m), weight (!) 150.1 kg, SpO2 98 %. Body mass index is 53.42 kg/m. General appearance: awake, cooperative, appears stated age and no distress Head: Normocephalic, atraumatic. Eyes: Brown eyes, pink conjunctiva, corneas clear. PERRL, EOM's intact. Neck: No adenopathy, no carotid bruit, no JVD, supple, symmetrical, trachea midline and thyroid not enlarged. Resp: Basal rhonchi to auscultation bilaterally. Cardio: Regular rate and rhythm, S1, S2 normal, II/VI systolic murmur, no click, rub or gallop GI: Soft, non-tender; bowel sounds normal; no organomegaly. Extremities: No edema, cyanosis or clubbing. Skin: Warm and dry.  Neurologic: Alert and oriented X 1, normal strength. .  Assessment/Plan NSTEMI CAD Paroxysmal atrial fibrillation HTN Morbid obesity Hyperlipidemia S/P  stroke S/P pacemaker for 3rd degree heart block  Hold Eliquis. Use IV fluids and IV heparin  Cardiac catheterization Echocardiogram for LV function..  Time spent: Review of old records, Lab, x-rays, EKG, other cardiac tests, examination, discussion with patient over 70 minutes.  Birdie Riddle, MD  07/16/2020, 2:02 PM

## 2020-07-16 NOTE — ED Notes (Signed)
Lactic 2.8

## 2020-07-16 NOTE — Progress Notes (Signed)
ANTICOAGULATION CONSULT NOTE - Initial Consult  Pharmacy Consult for heparin Indication: chest pain/ACS  Allergies  Allergen Reactions  . Ampicillin Other (See Comments)    Increases heart rhythm   . Penicillins Other (See Comments)    Increases heart rhythm   . Linzess [Linaclotide] Other (See Comments)    Severe stomach pains    Patient Measurements: Height: 5\' 6"  (167.6 cm) Weight: (!) 150.1 kg (331 lb) IBW/kg (Calculated) : 59.3 Heparin Dosing Weight: 100kg  Vital Signs: Temp: 100.8 F (38.2 C) (09/21 2110) Temp Source: Oral (09/21 2110) BP: 128/66 (09/21 2330) Pulse Rate: 73 (09/21 2330)  Labs: Recent Labs    07/15/20 2111 07/15/20 2311  HGB 12.2  --   HCT 38.8  --   PLT 203  --   CREATININE 1.74*  --   TROPONINIHS 9 39*    Estimated Creatinine Clearance: 41.5 mL/min (A) (by C-G formula based on SCr of 1.74 mg/dL (H)).   Medical History: Past Medical History:  Diagnosis Date  . Arthritis   . CHF (congestive heart failure) ([place])    05/2011 echo severe concentric LVH, normal systolic fxn, grade 1 diastolic dysfxn, paradoxical septal motion  . Coronary artery disease    Cath 04/2006 normal coronaries, mild-mod slow flow in all coronaries with slow distall runoff, mild-mod LV enlargement, EF 55%   . Depression   . Dyspnea   . Dysrhythmia   . GERD (gastroesophageal reflux disease)   . Hypercholesterolemia   . Hyperlipemia   . Hypertension   . MI (myocardial infarction) ([place])    NOT SURE HOW MANY  . Morbid obesity ([place])   . Nocturia   . 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 Peripheral vascular disease ([place])   . Pneumonia NONE RECENT  . Sleep apnea    needs cpap but does not wear  . Stroke ([place])   . 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  Goal of Therapy:  Heparin level 0.3-0.7 units/ml aPTT 66-102 seconds Monitor platelets by anticoagulation protocol: Yes   Plan:  Will start heparin gtt at 1600 units/hr and monitor heparin levels, aPTT (while Eliquis is affecting anti-Xa), and CBC.  Wynona Neat, PharmD, BCPS  07/16/2020,12:57 AM
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
## 2020-07-16 NOTE — H&P (Signed)
History and Physical    [name] OZD:[id number] DOB: 09/15/1941 DOA: 07/15/2020  PCP: Pcp, No   Patient coming from: Home  Chief Complaint: chest pain/pressure, upper abdominal pain, n,v  HPI: [name] is a 79 y.o. female with medical history significant for CAD, diastolic CHF, hypertension, hyperlipidemia, osteoarthritis, paroxysmal atrial fibrillation, history of third-degree heart block, has pacemaker.  She presents by EMS with complaint of chest pain.  She reports that on July 15, 2020 she was watching TV at home when she developed substernal chest pressure while sitting in her chair.  She reports the pressure as a tight squeezing sensation that she rated as a 9 out of 10 at its worst.  She had associated shortness of breath, nausea, vomiting and some upper abdominal discomfort and indigestion.  When the pain lasted for 10 to 15 minutes she became concerned and called EMS.  She had a cardiac catheterization at the beginning of August of this year that showed a mid LAD 30% stenosis.  She states she has been taking all of her medications as prescribed.  She denies any prolonged immobilization.  She denies any aggravating factors for the chest pressure.  She states she does not think to take her nitroglycerin at home when she started having the chest pain.  ED Course: In the emergency room patient had work-up that revealed an elevated troponin.  Of her initial troponin was 9 and a repeat troponin a few hours later was 39.  She had no EKG changes.  Patient was negative for COVID-19.  Hospital service been asked to admit for further management  Review of Systems:  General: Denies weakness, fever, chills, weight loss, night sweats.  Denies dizziness.  Denies change in appetite HENT: Denies head trauma, headache, denies change in hearing, tinnitus.  Denies nasal congestion or bleeding.  Denies sore throat, sores in mouth.  Denies difficulty swallowing Eyes: Denies blurry  vision, pain in eye, drainage.  Denies discoloration of eyes. Neck: Denies pain.  Denies swelling.  Denies pain with movement. Cardiovascular: Reports substernal chest pain/pressure. Denies palpitations.  Denies edema.  Denies orthopnea Respiratory: Reports shortness of breath with chest pain. No cough.  Denies wheezing.  Denies sputum production Gastrointestinal: Reports upper abdominal pain. Reports nausea, vomiting. Denies diarrhea.  Denies melena.  Denies hematemesis. Musculoskeletal: Denies limitation of movement.  Denies deformity or swelling.  Denies pain.  Denies arthralgias or myalgias. Genitourinary: Denies pelvic pain.  Denies urinary frequency or hesitancy.  Denies dysuria.  Skin: Denies rash.  Denies petechiae, purpura, ecchymosis. Neurological: Denies headache.  Denies syncope.  Denies seizure activity.  Denies weakness or paresthesia.  Denies slurred speech, drooping face.  Denies visual change. Psychiatric: Denies depression, anxiety.  Denies suicidal thoughts or ideation.  Denies hallucinations.  Past Medical History:  Diagnosis Date  . Arthritis   . CHF (congestive heart failure) ([place])    05/2011 echo severe concentric LVH, normal systolic fxn, grade 1 diastolic dysfxn, paradoxical septal motion  . Coronary artery disease    Cath 04/2006 normal coronaries, mild-mod slow flow in all coronaries with slow distall runoff, mild-mod LV enlargement, EF 55%   . Depression   . Dyspnea   . Dysrhythmia   . GERD (gastroesophageal reflux disease)   . Hypercholesterolemia   . Hyperlipemia   . Hypertension   . MI (myocardial infarction) ([place])    NOT SURE HOW MANY  . Morbid obesity ([place])   . Nocturia   . Pacemaker 2003   For  second degree HB  . Peripheral vascular disease ([place])   . Pneumonia NONE RECENT  . Sleep apnea    needs cpap but does not wear  . Stroke ([place])   . 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 Vertigo     takes meclizine daily    Past Surgical History:  Procedure Laterality Date  . ABDOMINAL HYSTERECTOMY    . CARDIAC CATHETERIZATION    . CATARACT EXTRACTION W/PHACO Left 08/29/2013   Procedure: CATARACT EXTRACTION PHACO AND INTRAOCULAR LENS PLACEMENT (IOC);  Surgeon: Adonis Brook, MD;  Location: [facility];  Service: Ophthalmology;  Laterality: Left;  . COLONOSCOPY WITH PROPOFOL N/A 01/07/2014   Procedure: COLONOSCOPY WITH PROPOFOL;  Surgeon: Juanita Craver, MD;  Location: [facility];  Service: Endoscopy;  Laterality: N/A;  . CORONARY ANGIOGRAPHY N/A 05/27/2020   Procedure: CORONARY ANGIOGRAPHY;  Surgeon: Dixie Dials, MD;  Location: [facility];  Service: Cardiovascular;  Laterality: N/A;  . DILATION AND CURETTAGE OF UTERUS    . INSERT / REPLACE / REMOVE PACEMAKER    . PACEMAKER GENERATOR CHANGE N/A 03/27/2014   Procedure: PACEMAKER GENERATOR CHANGE;  Surgeon: Sanda Klein, MD;  Location: [facility] [facility];  Service: Cardiovascular;  Laterality: N/A;  . PACEMAKER INSERTION    . PACEMAKER INSERTION    . TUBAL LIGATION    . VASCULAR SURGERY     vericose vein surgery    Social History  reports that she has never smoked. She has never used smokeless tobacco. She reports that she does not drink alcohol and does not use drugs.  Allergies  Allergen Reactions  . Ampicillin Other (See Comments)    Increases heart rhythm   . Penicillins Other (See Comments)    Increases heart rhythm   . Linzess [Linaclotide] Other (See Comments)    Severe stomach pains    History reviewed. No pertinent family history.   Prior to Admission medications   Medication Sig Start Date End Date Taking? Authorizing Provider  albuterol (VENTOLIN HFA) 108 (90 Base) MCG/ACT inhaler Inhale 2 puffs into the lungs every 6 (six) hours as needed for wheezing or shortness of breath.  10/12/19   [provider]  allopurinol (ZYLOPRIM) 100 MG tablet Take 100 mg by mouth daily.    [provider]  amiodarone  (PACERONE) 200 MG tablet Take 200 mg by mouth daily.    [provider]  amLODipine (NORVASC) 10 MG tablet Take 10 mg by mouth daily. 11/20/19   [provider]  apixaban (ELIQUIS) 5 MG TABS tablet Take 5 mg by mouth 2 (two) times daily. 02/11/17   [provider]  aspirin EC 81 MG tablet Take 81 mg by mouth daily.    [provider]  clotrimazole (LOTRIMIN) 1 % cream Apply 1 application topically daily as needed (to any rashes).  11/22/19   [provider]  famotidine (PEPCID) 40 MG tablet Take 40 mg by mouth daily as needed for heartburn or indigestion.  11/29/19   [provider]  fluticasone (FLONASE) 50 MCG/ACT nasal spray Place 2 sprays into both nostrils at bedtime as needed for allergies or rhinitis.  07/08/19   [provider]  furosemide (LASIX) 40 MG tablet Take 40 mg by mouth in the morning.     [provider]  hydrALAZINE (APRESOLINE) 50 MG tablet Take 50 mg by mouth 3 (three) times daily. 09/10/19   [provider]  isosorbide mononitrate (  IMDUR) 60 MG 24 hr tablet Take 60 mg by mouth every morning.     [provider]  losartan (COZAAR) 100 MG tablet Take 100 mg by mouth daily.    [provider]  lubiprostone (AMITIZA) 24 MCG capsule Take 24 mcg by mouth 2 (two) times daily as needed for constipation.  11/26/19   [provider]  meclizine (ANTIVERT) 25 MG tablet Take 25 mg by mouth 2 (two) times daily.     [provider]  metolazone (ZAROXOLYN) 2.5 MG tablet Take 2.5 mg by mouth daily.    [provider]  metoprolol succinate (TOPROL-XL) 100 MG 24 hr tablet Take 50-100 mg by mouth See admin instructions. Take 100 mg by mouth in the morning and 50 mg at bedtime 10/29/19   [provider]  nitroGLYCERIN (NITROSTAT) 0.4 MG SL tablet Place 0.4 mg under the tongue every 5 (five) minutes as needed for chest pain.     [provider]  oxybutynin (DITROPAN)  5 MG tablet Take 5 mg by mouth 2 (two) times daily.    [provider]  oxyCODONE-acetaminophen (PERCOCET/ROXICET) 5-325 MG tablet Take 1 tablet by mouth See admin instructions. Take 1 tablet by mouth two to three times a day 11/20/19   [provider]  pantoprazole (PROTONIX) 40 MG tablet Take 40 mg by mouth 2 (two) times daily.     [provider]  potassium chloride SA (K-DUR,KLOR-CON) 20 MEQ tablet Take 20 mEq by mouth 2 (two) times daily.     [provider]  Psyllium (METAMUCIL PO) Take by mouth See admin instructions. Mix 1 teaspoonful into 8 ounces of water and drink once a day    [provider]  rosuvastatin (CRESTOR) 10 MG tablet Take 10 mg by mouth at bedtime.     [provider]  Vitamin D, Ergocalciferol, (DRISDOL) 1.25 MG (50000 UNIT) CAPS capsule Take 50,000 Units by mouth every Monday. 10/01/19   [provider]    Physical Exam: Vitals:   07/15/20 2200 07/15/20 2300 07/15/20 2330 07/16/20 0000  BP: (!) 142/92  128/66 128/67  Pulse: 69  73   Resp: (!) 24  (!) 21 (!) 24  Temp:      TempSrc:      SpO2: 99% 98% 98% 98%  Weight:      Height:        Constitutional: NAD, calm, comfortable Vitals:   07/15/20 2200 07/15/20 2300 07/15/20 2330 07/16/20 0000  BP: (!) 142/92  128/66 128/67  Pulse: 69  73   Resp: (!) 24  (!) 21 (!) 24  Temp:      TempSrc:      SpO2: 99% 98% 98% 98%  Weight:      Height:       General: WDWN, Alert and oriented x3.  Eyes: EOMI, PERRL, lids and conjunctivae normal.  Sclera nonicteric HENT:  [place]/AT, external ears normal.  Nares patent without epistasis.  Mucous membranes are moist. Posterior pharynx clear of any exudate or lesions.  Neck: Soft, normal range of motion, supple, no masses, Trachea midline Respiratory: clear to auscultation bilaterally, no wheezing, no crackles. Normal respiratory effort. No accessory muscle use.  Cardiovascular: Regular rate and rhythm, no murmurs / rubs  / gallops. No extremity edema. 1+ pedal pulses.  Abdomen: Soft, no tenderness, nondistended, obese. no rebound or guarding.  No masses palpated. Bowel sounds normoactive Musculoskeletal: FROM. no clubbing / cyanosis. No joint deformity upper and lower extremities.  Normal muscle tone.  Skin: Warm, dry, intact no rashes, lesions, ulcers. No induration Neurologic: CN 2-12 grossly intact.  Normal speech.  Sensation intact,  Strength 5/5 in all extremities.   Psychiatric: Normal judgment and insight.  Normal mood.    Labs on Admission: I have personally reviewed following labs and imaging studies  CBC: Recent Labs  Lab 07/15/20 2111  WBC 11.4*  HGB 12.2  HCT 38.8  MCV 100.8*  PLT 510    Basic Metabolic Panel: Recent Labs  Lab 07/15/20 2111  NA 139  K 3.8  CL 105  CO2 24  GLUCOSE 128*  BUN 20  CREATININE 1.74*  CALCIUM 8.6*    GFR: Estimated Creatinine Clearance: 41.5 mL/min (A) (by C-G formula based on SCr of 1.74 mg/dL (H)).  Liver Function Tests: Recent Labs  Lab 07/15/20 2111  AST 22  ALT 13  ALKPHOS 75  BILITOT 0.7  PROT 7.3  ALBUMIN 3.5    Urine analysis:    Component Value Date/Time   COLORURINE YELLOW 12/01/2019 1530   APPEARANCEUR HAZY (A) 12/01/2019 1530   LABSPEC 1.009 12/01/2019 1530   PHURINE 7.0 12/01/2019 1530   GLUCOSEU NEGATIVE 12/01/2019 1530   HGBUR NEGATIVE 12/01/2019 1530   BILIRUBINUR NEGATIVE 12/01/2019 1530   KETONESUR NEGATIVE 12/01/2019 1530   PROTEINUR NEGATIVE 12/01/2019 1530   UROBILINOGEN 0.2 02/07/2013 0203   NITRITE NEGATIVE 12/01/2019 1530   LEUKOCYTESUR NEGATIVE 12/01/2019 1530    Radiological Exams on Admission: CT ABDOMEN PELVIS WO CONTRAST  Result Date: 07/16/2020 CLINICAL DATA:  Generalized abdominal pain with nausea and vomiting EXAM: CT ABDOMEN AND PELVIS WITHOUT CONTRAST TECHNIQUE: Multidetector CT imaging of the abdomen and pelvis was performed following the standard protocol without IV contrast. COMPARISON:   12/02/2019 FINDINGS: Lower chest: Lung bases demonstrate some mild ground-glass opacities new from the prior exam. Correlate with COVID-19 testing. Hepatobiliary: No focal liver abnormality is seen. No gallstones, gallbladder wall thickening, or biliary dilatation. Pancreas: Pancreas demonstrates fatty replacement. No mass lesion is seen. Spleen: Normal in size without focal abnormality. Adrenals/Urinary Tract: Adrenal glands are unremarkable. Kidneys demonstrate no renal calculi or obstructive change. Right renal cyst is again noted. The bladder is well distended. Stomach/Bowel: Scattered diverticular change of the colon is noted. No obstructive changes are seen. The appendix is within normal limits. Small bowel and stomach are unremarkable with the exception of a sliding-type hiatal hernia stable from the prior study. Vascular/Lymphatic: Aortic atherosclerosis. No enlarged abdominal or pelvic lymph nodes. Reproductive: Status post hysterectomy. No adnexal masses. Other: No abdominal wall hernia or abnormality. No abdominopelvic ascites. Musculoskeletal: Degenerative changes of lumbar spine are noted. IMPRESSION: New ground-glass opacities in the lung bases. Correlate with COVID-19 testing which is pending. Chronic changes stable from the prior study. Electronically Signed   By: Inez Catalina M.D.   On: 07/16/2020 00:39   CT Head Wo Contrast  Result Date: 07/15/2020 CLINICAL DATA:  New onset headaches EXAM: CT HEAD WITHOUT CONTRAST TECHNIQUE: Contiguous axial images were obtained from the base of the skull through the vertex without intravenous contrast. COMPARISON:  01/06/2017 FINDINGS: Brain: No evidence of acute infarction, hemorrhage, hydrocephalus, extra-axial collection or mass lesion/mass effect. Mild chronic white matter ischemic change is again seen and stable. Vascular: No hyperdense vessel or unexpected calcification. Skull: Normal. Negative for fracture or focal lesion. Sinuses/Orbits: No acute  finding. Other: None. IMPRESSION: Chronic ischemic changes without acute abnormality. Electronically Signed   By: Inez Catalina M.D.   On: 07/15/2020 23:09  DG Chest Port 1 View  Result Date: 07/15/2020 CLINICAL DATA:  Headache, vomiting, chest pain EXAM: PORTABLE CHEST 1 VIEW COMPARISON:  05/22/2020 FINDINGS: Single frontal view of the chest demonstrates stable dual lead pacemaker. The cardiac silhouette is enlarged. There is increased central vascular congestion, without airspace disease, effusion, or pneumothorax. No acute bony abnormalities. IMPRESSION: 1. Central vascular congestion without overt edema. 2. Stable enlarged cardiac silhouette. Electronically Signed   By: Randa Ngo M.D.   On: 07/15/2020 21:25    EKG: Independently reviewed.  EKG is reviewed.  Normal sinus rhythm with left bundle branch block.  QTc is prolonged at 508  Assessment/Plan Principal Problem:   NSTEMI (non-ST elevated myocardial infarction) [facility]) Patient admitted on cardiac telemetry for observation for chest pain.  Obtain serial troponin levels. Consult cardiology in am. Antiplatelet therapy with aspirin daily. Placed on heparin for anticoagulation with NSTEMI.  Check lipid panel.  Continue statin therapy.  Monitor blood pressure.  Nitroglycerin as needed.  Supplemental oxygen as needed to maintain O2 sat between 92-96%  Active Problems:   Coronary artery disease History of CAD.  Patient had cardiac catheterization in May 27, 2020 percent stenosis of mid LAD    Hypertension Continue home dose of metoprolol, hydralazine, isosorbide, Cozaar.  Monitor blood pressure    Hyperlipemia Continue home dose of Crestor.  Check lipid panel.    Chronic diastolic CHF (congestive heart failure) (HCC) Chronic diastolic CHF.  Patient had echocardiogram a few months ago.  Results were reviewed.  Patient be continued on home medications including Lasix.  Monitor I&O's and avoid volume overload    Paroxysmal atrial  fibrillation [facility]) Patient has a history of paroxysmal atrial fibrillation.  She is currently on amiodarone, metoprolol and Eliquis.  Last dose of Eliquis was early Tuesday morning (07/15/20).    Prolonged QT interval Avoid medications that can further prolong QT.  Patient does have a pacemaker.  Monitor on telemetry    Pacemaker   Morbid obesity ([place])     DVT prophylaxis: Was placed on heparin for anticoagulation with ACS.  Pharmacy is monitoring and dosing heparin Code Status:   Full code Family Communication:  Diagnosis plan discussed with patient.  Patient verbalized understanding agrees with plan.  Questions answered.  Further recommendation to follow prescription medication Disposition Plan:   Patient is from:  Home  Anticipated DC to:  Home  Anticipated DC date:  Anticipate more than 2 midnight stay in the hospital  Anticipated DC barriers: No barriers to discharge identified at this time  Consults called:  Cardiology will need to consult in the morning for NSTEMI Admission status:  Inpatient  Severity of Illness: The appropriate patient status for this patient is INPATIENT. Inpatient status is judged to be reasonable and necessary in order to provide the required intensity of service to ensure the patient's safety. The patient's presenting symptoms, physical exam findings, and initial radiographic and laboratory data in the context of their chronic comorbidities is felt to place them at high risk for further clinical deterioration. Furthermore, it is not anticipated that the patient will be medically stable for discharge from the hospital within 2 midnights of admission. The following factors support the patient status of inpatient.   * I certify that at the point of admission it is my clinical judgment that the patient will require inpatient hospital care spanning beyond 2 midnights from the point of admission due to high intensity of service, high risk for further deterioration  and high frequency of  surveillance required.Yevonne Aline Celestine Bougie MD [facility]  How to contact the [facility] Attending or Consulting provider [place] or covering provider during after hours [place], for this patient?   1. Check the care team in [facility] and look for a) attending/consulting [facility] provider listed and b) the [facility] team listed 2. Log into [email or web address] and use [place]'s universal password to access. If you do not have the password, please contact the hospital operator. 3. Locate the [facility] provider you are looking for under [facility] and page to a number that you can be directly reached. 4. If you still have difficulty reaching the provider, please page the [facility] (Director on Call) for the Hospitalists listed on amion for assistance.  07/16/2020, 2:30 AM

## 2020-07-16 NOTE — Progress Notes (Addendum)
  Still awaiting ICU bed.  Patient in NAD.  Found sleeping.  Easily arouses.  No complaints.    Remains on room air.  No vasopressors required thus far. Lactic cleared 2.8->1.5  Blood pressure (!) 124/55 (74), pulse 64 NSR, temperature 99.8 F (37.7 C), temperature source Oral, resp. rate 14, height 5\' 6"  (1.676 m), weight (!) 150.1 kg, SpO2 98 %.   Spoke with Dr. Pietro Cassis.  Given her hemodynamic stability this afternoon, patient does not need ICU at this time and is appropriate for PCU.  Patient will be transferred back to [facility] service.  PCCM will sign off.  Please do not hesitate to call us back if we can be of any further assistance or if her condition deteriorates.       Kennieth Rad, MSN, AGACNP-BC [facility] 07/16/2020, 5:24 PM  See Amion for personal pager PCCM on call pager [phone]

## 2020-07-16 NOTE — ED Notes (Signed)
Date and time results received: 07/16/20 0044 (use smartphrase ".now" to insert current time)  Test: Trop Critical Value: 39  Name of Provider Notified: Roxanne Mins   Orders Received? Or Actions Taken?:

## 2020-07-16 NOTE — Progress Notes (Signed)
*  PRELIMINARY RESULTS* Echocardiogram 2D Echocardiogram has been performed.  [name] 07/16/2020, 4:00 PM

## 2020-07-16 NOTE — ED Notes (Signed)
RN attempted to call report to [facility] RN

## 2020-07-16 NOTE — ED Notes (Signed)
Patient transported to CT 

## 2020-07-16 NOTE — Progress Notes (Signed)
[address] for heparin Indication: chest pain/ACS  Allergies  Allergen Reactions  . Ampicillin Other (See Comments)    Increases heart rhythm   . Penicillins Other (See Comments)    Increases heart rhythm   . Linzess [Linaclotide] Other (See Comments)    Severe stomach pains    Patient Measurements: Height: 5\' 6"  (167.6 cm) Weight: (!) 150.1 kg (331 lb) IBW/kg (Calculated) : 59.3 Heparin Dosing Weight: 100kg  Vital Signs: Temp: 99.8 F (37.7 C) (09/22 1025) Temp Source: Oral (09/22 1025) BP: 75/46 (09/22 1200) Pulse Rate: 62 (09/22 1200)  Labs: Recent Labs    07/15/20 2111 07/15/20 2311 07/16/20 0755 07/16/20 1042  HGB 12.2  --  12.1  --   HCT 38.8  --  39.2  --   PLT 203  --  172  --   APTT  --   --   --  77*  HEPARINUNFRC  --   --   --  1.84*  CREATININE 1.74*  --  2.42*  --   TROPONINIHS 9 39* 849*  --     Estimated Creatinine Clearance: 29.8 mL/min (A) (by C-G formula based on SCr of 2.42 mg/dL (H)).   Medical History: Past Medical History:  Diagnosis Date  . Arthritis   . CHF (congestive heart failure) ([place])    05/2011 echo severe concentric LVH, normal systolic fxn, grade 1 diastolic dysfxn, paradoxical septal motion  . Coronary artery disease    Cath 04/2006 normal coronaries, mild-mod slow flow in all coronaries with slow distall runoff, mild-mod LV enlargement, EF 55%   . Depression   . Dyspnea   . Dysrhythmia   . GERD (gastroesophageal reflux disease)   . Hypercholesterolemia   . Hyperlipemia   . Hypertension   . MI (myocardial infarction) ([place])    NOT SURE HOW MANY  . Morbid obesity ([place])   . Nocturia   . 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 Peripheral vascular disease ([place])   . Pneumonia NONE RECENT  . Sleep apnea    needs cpap but does not wear  . Stroke ([place])   . 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  Initial aPTT therapeutic at 77 seconds, anti-Xa level elevated as expected with eliquis dose.  Will continue to dose based on aPTT until Anti-Xa correlated.    Goal of Therapy:  Heparin level 0.3-0.7 units/ml aPTT 66-102 seconds Monitor platelets by anticoagulation protocol: Yes   Plan:  Continue heparin gtt at 1600 units/hr Daily aPTT/HL, CBC, s/s bleeding F/u ability to transition back to [place], PharmD Clinical Pharmacist ED Pharmacist Phone # [phone] 07/16/2020 12:39 PM

## 2020-07-17 LAB — CBC WITH DIFFERENTIAL/PLATELET
Abs Immature Granulocytes: 0.09 10*3/uL — ABNORMAL HIGH (ref 0.00–0.07)
Basophils Absolute: 0.1 10*3/uL (ref 0.0–0.1)
Basophils Relative: 1 %
Eosinophils Absolute: 0 10*3/uL (ref 0.0–0.5)
Eosinophils Relative: 0 %
HCT: 39.9 % (ref 36.0–46.0)
Hemoglobin: 12 g/dL (ref 12.0–15.0)
Immature Granulocytes: 1 %
Lymphocytes Relative: 12 %
Lymphs Abs: 1.5 10*3/uL (ref 0.7–4.0)
MCH: 31.3 pg (ref 26.0–34.0)
MCHC: 30.1 g/dL (ref 30.0–36.0)
MCV: 104.2 fL — ABNORMAL HIGH (ref 80.0–100.0)
Monocytes Absolute: 1.1 10*3/uL — ABNORMAL HIGH (ref 0.1–1.0)
Monocytes Relative: 9 %
Neutro Abs: 9.8 10*3/uL — ABNORMAL HIGH (ref 1.7–7.7)
Neutrophils Relative %: 77 %
Platelets: 146 10*3/uL — ABNORMAL LOW (ref 150–400)
RBC: 3.83 MIL/uL — ABNORMAL LOW (ref 3.87–5.11)
RDW: 17 % — ABNORMAL HIGH (ref 11.5–15.5)
WBC: 12.5 10*3/uL — ABNORMAL HIGH (ref 4.0–10.5)
nRBC: 0 % (ref 0.0–0.2)

## 2020-07-17 LAB — BASIC METABOLIC PANEL
Anion gap: 11 (ref 5–15)
BUN: 24 mg/dL — ABNORMAL HIGH (ref 8–23)
CO2: 23 mmol/L (ref 22–32)
Calcium: 8.1 mg/dL — ABNORMAL LOW (ref 8.9–10.3)
Chloride: 109 mmol/L (ref 98–111)
Creatinine, Ser: 1.94 mg/dL — ABNORMAL HIGH (ref 0.44–1.00)
GFR calc Af Amer: 28 mL/min — ABNORMAL LOW (ref >60)
GFR calc non Af Amer: 25 mL/min — ABNORMAL LOW (ref >60)
Glucose, Bld: 104 mg/dL — ABNORMAL HIGH (ref 70–99)
Potassium: 4.7 mmol/L (ref 3.5–5.1)
Sodium: 143 mmol/L (ref 135–145)

## 2020-07-17 LAB — MRSA PCR SCREENING: MRSA by PCR: NEGATIVE

## 2020-07-17 LAB — RESPIRATORY PANEL BY RT PCR (FLU A&B, COVID)
Influenza A by PCR: NEGATIVE
Influenza B by PCR: NEGATIVE
SARS Coronavirus 2 by RT PCR: NEGATIVE

## 2020-07-17 LAB — TROPONIN I (HIGH SENSITIVITY): Troponin I (High Sensitivity): 1188 ng/L (ref <18)

## 2020-07-17 LAB — HEPARIN LEVEL (UNFRACTIONATED): Heparin Unfractionated: 1.32 IU/mL — ABNORMAL HIGH (ref 0.30–0.70)

## 2020-07-17 LAB — PROCALCITONIN: Procalcitonin: 10.67 ng/mL

## 2020-07-17 LAB — PHOSPHORUS: Phosphorus: 3.1 mg/dL (ref 2.5–4.6)

## 2020-07-17 LAB — APTT: aPTT: 98 seconds — ABNORMAL HIGH (ref 24–36)

## 2020-07-17 LAB — MAGNESIUM: Magnesium: 2 mg/dL (ref 1.7–2.4)

## 2020-07-17 MED ORDER — TRAMADOL HCL 50 MG PO TABS
50.0000 mg | ORAL_TABLET | Freq: Two times a day (BID) | ORAL | Status: DC | PRN
  Administered 2020-07-17 – 2020-07-19 (×4): 50 mg via ORAL
  Filled 2020-07-17 (×4): qty 1

## 2020-07-17 MED ORDER — SODIUM CHLORIDE 0.9 % IV SOLN
2.0000 g | INTRAVENOUS | Status: DC
  Administered 2020-07-18 – 2020-07-19 (×2): 2 g via INTRAVENOUS
  Filled 2020-07-17 (×2): qty 2
  Filled 2020-07-17: qty 20

## 2020-07-17 MED ORDER — ONDANSETRON HCL 4 MG/2ML IJ SOLN
4.0000 mg | Freq: Four times a day (QID) | INTRAMUSCULAR | Status: DC | PRN
  Administered 2020-07-17 – 2020-07-25 (×4): 4 mg via INTRAVENOUS
  Filled 2020-07-17 (×4): qty 2

## 2020-07-17 MED ORDER — LIDOCAINE 5 % EX PTCH
1.0000 | MEDICATED_PATCH | CUTANEOUS | Status: DC
  Administered 2020-07-17 – 2020-07-25 (×8): 1 via TRANSDERMAL
  Filled 2020-07-17 (×9): qty 1
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
## 2020-07-17 NOTE — Plan of Care (Signed)
  Problem: Education: Goal: Knowledge of General Education information will improve Description: Including pain rating scale, medication(s)/side effects and non-pharmacologic comfort measures Outcome: Progressing   Problem: Health Behavior/Discharge Planning: Goal: Ability to manage health-related needs will improve Outcome: Progressing   Problem: Clinical Measurements: Goal: Ability to maintain clinical measurements within normal limits will improve Outcome: Progressing   Problem: Clinical Measurements: Goal: Diagnostic test results will improve Outcome: Progressing   Problem: Clinical Measurements: Goal: Cardiovascular complication will be avoided Outcome: Progressing   Problem: Nutrition: Goal: Adequate nutrition will be maintained Outcome: Progressing   Problem: Pain Managment: Goal: General experience of comfort will improve Outcome: Progressing   Problem: Skin Integrity: Goal: Risk for impaired skin integrity will decrease Outcome: Progressing   

## 2020-07-17 NOTE — Plan of Care (Signed)

## 2020-07-17 NOTE — Progress Notes (Signed)
[place] for heparin Indication: chest pain/ACS  Allergies  Allergen Reactions  . Ampicillin Other (See Comments)    Increases heart rhythm   . Penicillins Other (See Comments)    Increases heart rhythm   . Linzess [Linaclotide] Other (See Comments)    Severe stomach pains    Patient Measurements: Height: 5\' 6"  (167.6 cm) Weight: (!) 168 kg (370 lb 6 oz) IBW/kg (Calculated) : 59.3 Heparin Dosing Weight: 100kg  Vital Signs: Temp: 100.1 F (37.8 C) (09/23 0740) Temp Source: Oral (09/23 0740) BP: 136/57 (09/23 0800) Pulse Rate: 73 (09/23 0800)  Labs: Recent Labs    07/15/20 2111 07/15/20 2311 07/16/20 0755 07/16/20 0755 07/16/20 1042 07/16/20 1210 07/16/20 1421 07/17/20 0543  HGB 12.2   < > 12.1  --   --   --   --  12.0  HCT 38.8  --  39.2  --   --   --   --  39.9  PLT 203  --  172  --   --   --   --  146*  APTT  --   --   --   --  77*  --   --  98*  HEPARINUNFRC  --   --   --   --  1.84*  --   --  1.32*  CREATININE 1.74*  --  2.42*  --   --   --   --  1.94*  TROPONINIHS 9   < > 849*   < > 813* 925* 664*  --    < > = values in this interval not displayed.    Estimated Creatinine Clearance: 40 mL/min (A) (by C-G formula based on SCr of 1.94 mg/dL (H)).   Medical History: Past Medical History:  Diagnosis Date  . Arthritis   . CHF (congestive heart failure) ([place])    05/2011 echo severe concentric LVH, normal systolic fxn, grade 1 diastolic dysfxn, paradoxical septal motion  . Coronary artery disease    Cath 04/2006 normal coronaries, mild-mod slow flow in all coronaries with slow distall runoff, mild-mod LV enlargement, EF 55%   . Depression   . Dyspnea   . Dysrhythmia   . GERD (gastroesophageal reflux disease)   . Hypercholesterolemia   . Hyperlipemia   . Hypertension   . MI (myocardial infarction) ([place])    NOT SURE HOW MANY  . Morbid obesity ([place])   . Nocturia   . 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  Peripheral vascular disease ([place])   . Pneumonia NONE RECENT  . Sleep apnea    needs cpap but does not wear  . Stroke ([place])   . 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  Initial aPTT therapeutic at 77 seconds, repeat 98, anti-Xa level elevated as expected with eliquis dose.  Will continue to dose based on aPTT until Anti-Xa correlated.    No bleeding issues noted.   Goal of Therapy:  Heparin level 0.3-0.7 units/ml aPTT 66-102 seconds Monitor platelets by anticoagulation protocol: Yes   Plan:  Continue heparin gtt at  1600 units/hr Daily aPTT/HL, CBC, s/s bleeding F/u ability to transition back to [place] PharmD., BCPS Clinical Pharmacist 07/17/2020 9:16 AM

## 2020-07-17 NOTE — H&P (View-Only) (Signed)
Ref: Pcp, No   Subjective:  Awake. Chest pain with breathing. Denies chest heaviness. Troponin I is trending down.  Pneumonia and sepsis treatment is ongoing. Creatinine down to 1.94 mg.  Objective:  Vital Signs in the last 24 hours: Temp:  [98.5 F (36.9 C)-102.9 F (39.4 C)] 100.1 F (37.8 C) (09/23 0740) Pulse Rate:  [60-75] 73 (09/23 0800) Cardiac Rhythm: Heart block;Bundle branch block (09/23 0744) Resp:  [14-25] 19 (09/23 0800) BP: (71-138)/(42-108) 136/57 (09/23 0800) SpO2:  [92 %-98 %] 95 % (09/23 0800) Weight:  [188 kg] 168 kg (09/22 2056)  Physical Exam: BP Readings from Last 1 Encounters:  07/17/20 (!) 136/57     Wt Readings from Last 1 Encounters:  07/16/20 (!) 168 kg    Weight change: 17.9 kg Body mass index is 59.78 kg/m. HEENT: [place]/AT, Eyes-Brown, Conjunctiva-Pink, Sclera-Non-icteric Neck: No JVD, No bruit, Trachea midline. Lungs:  Clearing, Bilateral. Cardiac:  Regular rhythm, normal S1 and S2, no S3. II/VI systolic murmur. Abdomen:  Soft, non-tender. BS present. Extremities:  2 + lower leg edema present. No cyanosis. No clubbing. CNS: AxOx2, Cranial nerves grossly intact, moves all 4 extremities.  Skin: Warm and dry.   Intake/Output from previous day: 09/22 0701 - 09/23 0700 In: 4979.2 [P.O.:720; I.V.:2109.2; IV Piggyback:2150] Out: -     Lab Results: BMET    Component Value Date/Time   NA 143 07/17/2020 0543   NA 142 07/16/2020 0755   NA 139 07/15/2020 2111   K 4.7 07/17/2020 0543   K 4.3 07/16/2020 0755   K 3.8 07/15/2020 2111   CL 109 07/17/2020 0543   CL 107 07/16/2020 0755   CL 105 07/15/2020 2111   CO2 23 07/17/2020 0543   CO2 21 (L) 07/16/2020 0755   CO2 24 07/15/2020 2111   GLUCOSE 104 (H) 07/17/2020 0543   GLUCOSE 130 (H) 07/16/2020 0755   GLUCOSE 128 (H) 07/15/2020 2111   BUN 24 (H) 07/17/2020 0543   BUN 23 07/16/2020 0755   BUN 20 07/15/2020 2111   CREATININE 1.94 (H) 07/17/2020 0543   CREATININE 2.42 (H) 07/16/2020 0755    CREATININE 1.74 (H) 07/15/2020 2111   CALCIUM 8.1 (L) 07/17/2020 0543   CALCIUM 8.2 (L) 07/16/2020 0755   CALCIUM 8.6 (L) 07/15/2020 2111   GFRNONAA 25 (L) 07/17/2020 0543   GFRNONAA 19 (L) 07/16/2020 0755   GFRNONAA 28 (L) 07/15/2020 2111   GFRAA 28 (L) 07/17/2020 0543   GFRAA 22 (L) 07/16/2020 0755   GFRAA 32 (L) 07/15/2020 2111   CBC    Component Value Date/Time   WBC 12.5 (H) 07/17/2020 0543   RBC 3.83 (L) 07/17/2020 0543   HGB 12.0 07/17/2020 0543   HCT 39.9 07/17/2020 0543   PLT 146 (L) 07/17/2020 0543   MCV 104.2 (H) 07/17/2020 0543   MCH 31.3 07/17/2020 0543   MCHC 30.1 07/17/2020 0543   RDW 17.0 (H) 07/17/2020 0543   LYMPHSABS 1.5 07/17/2020 0543   MONOABS 1.1 (H) 07/17/2020 0543   EOSABS 0.0 07/17/2020 0543   BASOSABS 0.1 07/17/2020 0543   HEPATIC Function Panel Recent Labs    12/02/19 1145 05/22/20 1615 07/15/20 2111  PROT 7.1 7.0 7.3   HEMOGLOBIN A1C No components found for: HGA1C,  MPG CARDIAC ENZYMES Lab Results  Component Value Date   CKTOTAL 63 09/10/2011   CKMB 1.7 09/10/2011   TROPONINI <0.03 06/16/2015   TROPONINI <0.30 02/07/2013   TROPONINI <0.30 10/17/2012   BNP No results for input(s): PROBNP in  the last 8760 hours. TSH No results for input(s): TSH in the last 8760 hours. CHOLESTEROL Recent Labs    05/23/20 0114 07/16/20 0755  CHOL 114 100    Scheduled Meds: . allopurinol  100 mg Oral Daily  . amiodarone  200 mg Oral Daily  . aspirin EC  81 mg Oral Daily  . doxycycline  100 mg Oral Q12H  . pantoprazole  40 mg Oral BID  . potassium chloride SA  20 mEq Oral BID  . psyllium  1 packet Oral Daily  . rosuvastatin  10 mg Oral QHS   Continuous Infusions: . sodium chloride 125 mL/hr at 07/16/20 1209  . [START ON 07/18/2020] cefTRIAXone (ROCEPHIN)  IV    . heparin 1,600 Units/hr (07/16/20 0257)   PRN Meds:.acetaminophen, albuterol, famotidine, HYDROcodone-acetaminophen, morphine injection, ondansetron (ZOFRAN)  IV  Assessment/Plan: NSTEMI CAD Paroxysmal atrial fibrillation HTN Morbid obesity Hyperlipidemia S/P stroke S/P pacemaker for 3rd. Degree heart block  Discussed postponing cardiac catheterization for few days till sepsis and pneumonia under control and renal function is stable. Recent cardiac cath suggests possible small vessel MI. Continue IV heparin and IV antibiotics.   LOS: 1 day   Time spent including chart review, lab review, examination, discussion with patient and nurse : 30 min   Dixie Dials  MD  07/17/2020, 10:17 AM

## 2020-07-17 NOTE — Consult Note (Signed)
Ref: Pcp, No   Subjective:  Awake. Chest pain with breathing. Denies chest heaviness. Troponin I is trending down.  Pneumonia and sepsis treatment is ongoing. Creatinine down to 1.94 mg.  Objective:  Vital Signs in the last 24 hours: Temp:  [98.5 F (36.9 C)-102.9 F (39.4 C)] 100.1 F (37.8 C) (09/23 0740) Pulse Rate:  [60-75] 73 (09/23 0800) Cardiac Rhythm: Heart block;Bundle branch block (09/23 0744) Resp:  [14-25] 19 (09/23 0800) BP: (71-138)/(42-108) 136/57 (09/23 0800) SpO2:  [92 %-98 %] 95 % (09/23 0800) Weight:  [607 kg] 168 kg (09/22 2056)  Physical Exam: BP Readings from Last 1 Encounters:  07/17/20 (!) 136/57     Wt Readings from Last 1 Encounters:  07/16/20 (!) 168 kg    Weight change: 17.9 kg Body mass index is 59.78 kg/m. HEENT: [place]/AT, Eyes-Brown, Conjunctiva-Pink, Sclera-Non-icteric Neck: No JVD, No bruit, Trachea midline. Lungs:  Clearing, Bilateral. Cardiac:  Regular rhythm, normal S1 and S2, no S3. II/VI systolic murmur. Abdomen:  Soft, non-tender. BS present. Extremities:  2 + lower leg edema present. No cyanosis. No clubbing. CNS: AxOx2, Cranial nerves grossly intact, moves all 4 extremities.  Skin: Warm and dry.   Intake/Output from previous day: 09/22 0701 - 09/23 0700 In: 4979.2 [P.O.:720; I.V.:2109.2; IV Piggyback:2150] Out: -     Lab Results: BMET    Component Value Date/Time   NA 143 07/17/2020 0543   NA 142 07/16/2020 0755   NA 139 07/15/2020 2111   K 4.7 07/17/2020 0543   K 4.3 07/16/2020 0755   K 3.8 07/15/2020 2111   CL 109 07/17/2020 0543   CL 107 07/16/2020 0755   CL 105 07/15/2020 2111   CO2 23 07/17/2020 0543   CO2 21 (L) 07/16/2020 0755   CO2 24 07/15/2020 2111   GLUCOSE 104 (H) 07/17/2020 0543   GLUCOSE 130 (H) 07/16/2020 0755   GLUCOSE 128 (H) 07/15/2020 2111   BUN 24 (H) 07/17/2020 0543   BUN 23 07/16/2020 0755   BUN 20 07/15/2020 2111   CREATININE 1.94 (H) 07/17/2020 0543   CREATININE 2.42 (H) 07/16/2020 0755    CREATININE 1.74 (H) 07/15/2020 2111   CALCIUM 8.1 (L) 07/17/2020 0543   CALCIUM 8.2 (L) 07/16/2020 0755   CALCIUM 8.6 (L) 07/15/2020 2111   GFRNONAA 25 (L) 07/17/2020 0543   GFRNONAA 19 (L) 07/16/2020 0755   GFRNONAA 28 (L) 07/15/2020 2111   GFRAA 28 (L) 07/17/2020 0543   GFRAA 22 (L) 07/16/2020 0755   GFRAA 32 (L) 07/15/2020 2111   CBC    Component Value Date/Time   WBC 12.5 (H) 07/17/2020 0543   RBC 3.83 (L) 07/17/2020 0543   HGB 12.0 07/17/2020 0543   HCT 39.9 07/17/2020 0543   PLT 146 (L) 07/17/2020 0543   MCV 104.2 (H) 07/17/2020 0543   MCH 31.3 07/17/2020 0543   MCHC 30.1 07/17/2020 0543   RDW 17.0 (H) 07/17/2020 0543   LYMPHSABS 1.5 07/17/2020 0543   MONOABS 1.1 (H) 07/17/2020 0543   EOSABS 0.0 07/17/2020 0543   BASOSABS 0.1 07/17/2020 0543   HEPATIC Function Panel Recent Labs    12/02/19 1145 05/22/20 1615 07/15/20 2111  PROT 7.1 7.0 7.3   HEMOGLOBIN A1C No components found for: HGA1C,  MPG CARDIAC ENZYMES Lab Results  Component Value Date   CKTOTAL 63 09/10/2011   CKMB 1.7 09/10/2011   TROPONINI <0.03 06/16/2015   TROPONINI <0.30 02/07/2013   TROPONINI <0.30 10/17/2012   BNP No results for input(s): PROBNP in  the last 8760 hours. TSH No results for input(s): TSH in the last 8760 hours. CHOLESTEROL Recent Labs    05/23/20 0114 07/16/20 0755  CHOL 114 100    Scheduled Meds: . allopurinol  100 mg Oral Daily  . amiodarone  200 mg Oral Daily  . aspirin EC  81 mg Oral Daily  . doxycycline  100 mg Oral Q12H  . pantoprazole  40 mg Oral BID  . potassium chloride SA  20 mEq Oral BID  . psyllium  1 packet Oral Daily  . rosuvastatin  10 mg Oral QHS   Continuous Infusions: . sodium chloride 125 mL/hr at 07/16/20 1209  . [START ON 07/18/2020] cefTRIAXone (ROCEPHIN)  IV    . heparin 1,600 Units/hr (07/16/20 0257)   PRN Meds:.acetaminophen, albuterol, famotidine, HYDROcodone-acetaminophen, morphine injection, ondansetron (ZOFRAN)  IV  Assessment/Plan: NSTEMI CAD Paroxysmal atrial fibrillation HTN Morbid obesity Hyperlipidemia S/P stroke S/P pacemaker for 3rd. Degree heart block  Discussed postponing cardiac catheterization for few days till sepsis and pneumonia under control and renal function is stable. Recent cardiac cath suggests possible small vessel MI. Continue IV heparin and IV antibiotics.   LOS: 1 day   Time spent including chart review, lab review, examination, discussion with patient and nurse : 30 min   Dixie Dials  MD  07/17/2020, 10:17 AM

## 2020-07-18 ENCOUNTER — Encounter (HOSPITAL_COMMUNITY): Payer: Self-pay | Admitting: Cardiovascular Disease

## 2020-07-18 ENCOUNTER — Inpatient Hospital Stay (HOSPITAL_COMMUNITY)
Admission: EM | Disposition: A | Discharge status: Home Health | Payer: Self-pay | Source: Home / Self Care | Attending: Internal Medicine

## 2020-07-18 HISTORY — PX: LEFT HEART CATH AND CORONARY ANGIOGRAPHY: CATH118249

## 2020-07-18 LAB — BASIC METABOLIC PANEL
Anion gap: 8 (ref 5–15)
BUN: 17 mg/dL (ref 8–23)
CO2: 23 mmol/L (ref 22–32)
Calcium: 8 mg/dL — ABNORMAL LOW (ref 8.9–10.3)
Chloride: 108 mmol/L (ref 98–111)
Creatinine, Ser: 1.5 mg/dL — ABNORMAL HIGH (ref 0.44–1.00)
GFR calc Af Amer: 39 mL/min — ABNORMAL LOW (ref >60)
GFR calc non Af Amer: 33 mL/min — ABNORMAL LOW (ref >60)
Glucose, Bld: 97 mg/dL (ref 70–99)
Potassium: 4.4 mmol/L (ref 3.5–5.1)
Sodium: 139 mmol/L (ref 135–145)

## 2020-07-18 LAB — CBC WITH DIFFERENTIAL/PLATELET
Abs Immature Granulocytes: 0.03 10*3/uL (ref 0.00–0.07)
Basophils Absolute: 0.1 10*3/uL (ref 0.0–0.1)
Basophils Relative: 1 %
Eosinophils Absolute: 0.3 10*3/uL (ref 0.0–0.5)
Eosinophils Relative: 4 %
HCT: 34.4 % — ABNORMAL LOW (ref 36.0–46.0)
Hemoglobin: 10.8 g/dL — ABNORMAL LOW (ref 12.0–15.0)
Immature Granulocytes: 0 %
Lymphocytes Relative: 17 %
Lymphs Abs: 1.2 10*3/uL (ref 0.7–4.0)
MCH: 32.4 pg (ref 26.0–34.0)
MCHC: 31.4 g/dL (ref 30.0–36.0)
MCV: 103.3 fL — ABNORMAL HIGH (ref 80.0–100.0)
Monocytes Absolute: 0.7 10*3/uL (ref 0.1–1.0)
Monocytes Relative: 9 %
Neutro Abs: 5 10*3/uL (ref 1.7–7.7)
Neutrophils Relative %: 69 %
Platelets: 145 10*3/uL — ABNORMAL LOW (ref 150–400)
RBC: 3.33 MIL/uL — ABNORMAL LOW (ref 3.87–5.11)
RDW: 16.6 % — ABNORMAL HIGH (ref 11.5–15.5)
WBC: 7.2 10*3/uL (ref 4.0–10.5)
nRBC: 0.3 % — ABNORMAL HIGH (ref 0.0–0.2)

## 2020-07-18 LAB — URINE CULTURE: Culture: >=100000 — AB

## 2020-07-18 LAB — APTT: aPTT: 78 seconds — ABNORMAL HIGH (ref 24–36)

## 2020-07-18 LAB — HEPARIN LEVEL (UNFRACTIONATED): Heparin Unfractionated: 0.65 IU/mL (ref 0.30–0.70)

## 2020-07-18 LAB — PROCALCITONIN: Procalcitonin: 7.32 ng/mL

## 2020-07-18 LAB — LEGIONELLA PNEUMOPHILA SEROGP 1 UR AG: L. pneumophila Serogp 1 Ur Ag: NEGATIVE

## 2020-07-18 SURGERY — LEFT HEART CATH AND CORONARY ANGIOGRAPHY
Anesthesia: LOCAL

## 2020-07-18 MED ORDER — SODIUM CHLORIDE 0.9% FLUSH
3.0000 mL | INTRAVENOUS | Status: DC | PRN
  Administered 2020-07-21: 3 mL via INTRAVENOUS

## 2020-07-18 MED ORDER — SODIUM CHLORIDE 0.9% FLUSH
3.0000 mL | Freq: Two times a day (BID) | INTRAVENOUS | Status: DC
  Administered 2020-07-18 – 2020-07-25 (×7): 3 mL via INTRAVENOUS

## 2020-07-18 MED ORDER — MIDAZOLAM HCL 2 MG/2ML IJ SOLN
INTRAMUSCULAR | Status: DC | PRN
  Administered 2020-07-18 (×3): 1 mg via INTRAVENOUS

## 2020-07-18 MED ORDER — VERAPAMIL HCL 2.5 MG/ML IV SOLN
INTRAVENOUS | Status: AC
  Filled 2020-07-18: qty 2

## 2020-07-18 MED ORDER — MIDAZOLAM HCL 2 MG/2ML IJ SOLN
INTRAMUSCULAR | Status: AC
  Filled 2020-07-18: qty 2

## 2020-07-18 MED ORDER — HEPARIN (PORCINE) IN NACL 1000-0.9 UT/500ML-% IV SOLN
INTRAVENOUS | Status: AC
  Filled 2020-07-18: qty 1000

## 2020-07-18 MED ORDER — IOHEXOL 350 MG/ML SOLN
INTRAVENOUS | Status: DC | PRN
  Administered 2020-07-18: 150 mL

## 2020-07-18 MED ORDER — VERAPAMIL HCL 2.5 MG/ML IV SOLN
INTRAVENOUS | Status: DC | PRN
  Administered 2020-07-18: 10 mL via INTRA_ARTERIAL

## 2020-07-18 MED ORDER — SODIUM CHLORIDE 0.9 % IV SOLN: INTRAVENOUS | Status: DC

## 2020-07-18 MED ORDER — HEPARIN SODIUM (PORCINE) 1000 UNIT/ML IJ SOLN
INTRAMUSCULAR | Status: AC
  Filled 2020-07-18: qty 1

## 2020-07-18 MED ORDER — HEPARIN SODIUM (PORCINE) 1000 UNIT/ML IJ SOLN
INTRAMUSCULAR | Status: DC | PRN
  Administered 2020-07-18 (×2): 5000 [IU] via INTRAVENOUS

## 2020-07-18 MED ORDER — HYDRALAZINE HCL 20 MG/ML IJ SOLN: 10.0000 mg | INTRAMUSCULAR | Status: AC | PRN

## 2020-07-18 MED ORDER — FENTANYL CITRATE (PF) 100 MCG/2ML IJ SOLN
INTRAMUSCULAR | Status: AC
  Filled 2020-07-18: qty 2

## 2020-07-18 MED ORDER — APIXABAN 5 MG PO TABS
5.0000 mg | ORAL_TABLET | Freq: Two times a day (BID) | ORAL | Status: DC
  Administered 2020-07-19 – 2020-07-25 (×13): 5 mg via ORAL
  Filled 2020-07-18 (×13): qty 1

## 2020-07-18 MED ORDER — SODIUM CHLORIDE 0.9 % IV SOLN: 250.0000 mL | INTRAVENOUS | Status: DC | PRN

## 2020-07-18 MED ORDER — SODIUM CHLORIDE 0.9% FLUSH
3.0000 mL | Freq: Two times a day (BID) | INTRAVENOUS | Status: DC
  Administered 2020-07-19 – 2020-07-25 (×8): 3 mL via INTRAVENOUS

## 2020-07-18 MED ORDER — LIDOCAINE HCL (PF) 1 % IJ SOLN
INTRAMUSCULAR | Status: DC | PRN
  Administered 2020-07-18: 2 mL

## 2020-07-18 MED ORDER — LIDOCAINE HCL (PF) 1 % IJ SOLN
INTRAMUSCULAR | Status: AC
  Filled 2020-07-18: qty 30

## 2020-07-18 MED ORDER — LABETALOL HCL 5 MG/ML IV SOLN: 10.0000 mg | INTRAVENOUS | Status: AC | PRN

## 2020-07-18 MED ORDER — SODIUM CHLORIDE 0.9% FLUSH: 3.0000 mL | INTRAVENOUS | Status: DC | PRN

## 2020-07-18 MED ORDER — SODIUM CHLORIDE 0.9 % IV SOLN: INTRAVENOUS | Status: AC

## 2020-07-18 MED ORDER — ASPIRIN 81 MG PO CHEW
81.0000 mg | CHEWABLE_TABLET | ORAL | Status: AC
  Administered 2020-07-18: 81 mg via ORAL
  Filled 2020-07-18: qty 1

## 2020-07-18 MED ORDER — FENTANYL CITRATE (PF) 100 MCG/2ML IJ SOLN
INTRAMUSCULAR | Status: DC | PRN
Start: 2020-07-18 — End: 2020-07-18
  Administered 2020-07-18: 25 ug via INTRAVENOUS
  Administered 2020-07-18: 50 ug via INTRAVENOUS
  Administered 2020-07-18: 25 ug via INTRAVENOUS

## 2020-07-18 SURGICAL SUPPLY — 11 items
CATH INFINITI 5FR MULTPACK ANG (CATHETERS) ×1 IMPLANT
DEVICE RAD COMP TR BAND LRG (VASCULAR PRODUCTS) ×1 IMPLANT
GLIDESHEATH SLEND SS 6F .021 (SHEATH) ×1 IMPLANT
GUIDEWIRE INQWIRE 1.5J.035X260 (WIRE) IMPLANT
INQWIRE 1.5J .035X260CM (WIRE) ×2
KIT HEART LEFT (KITS) ×2 IMPLANT
MAT PREVALON FULL STRYKER (MISCELLANEOUS) ×1 IMPLANT
PACK CARDIAC CATHETERIZATION (CUSTOM PROCEDURE TRAY) ×2 IMPLANT
SHEATH 6FR 75 DEST SLENDER (SHEATH) ×1 IMPLANT
SHEATH PROBE COVER 6X72 (BAG) ×1 IMPLANT
TRANSDUCER W/STOPCOCK (MISCELLANEOUS) ×2 IMPLANT

## 2020-07-18 NOTE — Interval H&P Note (Signed)
History and Physical Interval Note:  07/18/2020 11:12 AM  [name]  has presented today for surgery, with the diagnosis of nonstemi.  The various methods of treatment have been discussed with the patient and family. After consideration of risks, benefits and other options for treatment, the patient has consented to  Procedure(s): LEFT HEART CATH AND CORONARY ANGIOGRAPHY (N/A) as a surgical intervention.  The patient's history has been reviewed, patient examined, no change in status, stable for surgery.  I have reviewed the patient's chart and labs.  Questions were answered to the patient's satisfaction.     Birdie Riddle

## 2020-07-18 NOTE — Plan of Care (Signed)

## 2020-07-18 NOTE — Progress Notes (Signed)
[place] for heparin Indication: chest pain/ACS  Allergies  Allergen Reactions  . Ampicillin Other (See Comments)    Increases heart rhythm   . Penicillins Other (See Comments)    Increases heart rhythm   . Linzess [Linaclotide] Other (See Comments)    Severe stomach pains    Patient Measurements: Height: 5\' 6"  (167.6 cm) Weight: (!) 168 kg (370 lb 6 oz) IBW/kg (Calculated) : 59.3 Heparin Dosing Weight: 100kg  Vital Signs: Temp: 99.3 F (37.4 C) (09/24 0818) Temp Source: Oral (09/24 0818) BP: 140/67 (09/24 0818) Pulse Rate: 74 (09/24 0818)  Labs: Recent Labs    07/16/20 0755 07/16/20 0755 07/16/20 1042 07/16/20 1042 07/16/20 1210 07/16/20 1421 07/17/20 0543 07/17/20 1043 07/18/20 0055  HGB 12.1   < >  --   --   --   --  12.0  --  10.8*  HCT 39.2  --   --   --   --   --  39.9  --  34.4*  PLT 172  --   --   --   --   --  146*  --  145*  APTT  --   --  77*  --   --   --  98*  --  78*  HEPARINUNFRC  --   --  1.84*  --   --   --  1.32*  --  0.65  CREATININE 2.42*  --   --   --   --   --  1.94*  --  1.50*  TROPONINIHS 849*   < > 813*   < > 925* 664*  --  1,188*  --    < > = values in this interval not displayed.    Estimated Creatinine Clearance: 51.8 mL/min (A) (by C-G formula based on SCr of 1.5 mg/dL (H)).   Medical History: Past Medical History:  Diagnosis Date  . Arthritis   . CHF (congestive heart failure) ([place])    05/2011 echo severe concentric LVH, normal systolic fxn, grade 1 diastolic dysfxn, paradoxical septal motion  . Coronary artery disease    Cath 04/2006 normal coronaries, mild-mod slow flow in all coronaries with slow distall runoff, mild-mod LV enlargement, EF 55%   . Depression   . Dyspnea   . Dysrhythmia   . GERD (gastroesophageal reflux disease)   . Hypercholesterolemia   . Hyperlipemia   . Hypertension   . MI (myocardial infarction) ([place])    NOT SURE HOW MANY  . Morbid obesity ([place])   .  Nocturia   . 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 Peripheral vascular disease ([place])   . Pneumonia NONE RECENT  . Sleep apnea    needs cpap but does not wear  . Stroke ([place])   . 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  aPTT therapeutic at 78 seconds, anti-Xa level elevated as expected with eliquis dose but trending down close to therapeutic range.  Will continue to dose based on aPTT until Anti-Xa correlated, will likely be correlated in am 9/25. No bleeding issues noted.  Cath delayed d/t renal function.   No bleeding issues noted.   Goal of Therapy:  Heparin level 0.3-0.7 units/ml aPTT 66-102 seconds Monitor platelets by anticoagulation protocol: Yes   Plan:  Continue heparin gtt at 1600 units/hr Daily aPTT/HL, CBC, s/s bleeding F/u ability to transition back to PO when appropriate  Erin Hearing PharmD., BCPS Clinical Pharmacist 07/18/2020 9:28 AM
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
## 2020-07-19 ENCOUNTER — Inpatient Hospital Stay (HOSPITAL_COMMUNITY): Payer: Medicare Other

## 2020-07-19 LAB — CBC WITH DIFFERENTIAL/PLATELET
Abs Immature Granulocytes: 0.04 10*3/uL (ref 0.00–0.07)
Basophils Absolute: 0.1 10*3/uL (ref 0.0–0.1)
Basophils Relative: 1 %
Eosinophils Absolute: 0.2 10*3/uL (ref 0.0–0.5)
Eosinophils Relative: 3 %
HCT: 33.5 % — ABNORMAL LOW (ref 36.0–46.0)
Hemoglobin: 10.6 g/dL — ABNORMAL LOW (ref 12.0–15.0)
Immature Granulocytes: 1 %
Lymphocytes Relative: 15 %
Lymphs Abs: 1 10*3/uL (ref 0.7–4.0)
MCH: 31.9 pg (ref 26.0–34.0)
MCHC: 31.6 g/dL (ref 30.0–36.0)
MCV: 100.9 fL — ABNORMAL HIGH (ref 80.0–100.0)
Monocytes Absolute: 0.8 10*3/uL (ref 0.1–1.0)
Monocytes Relative: 11 %
Neutro Abs: 4.7 10*3/uL (ref 1.7–7.7)
Neutrophils Relative %: 69 %
Platelets: 125 10*3/uL — ABNORMAL LOW (ref 150–400)
RBC: 3.32 MIL/uL — ABNORMAL LOW (ref 3.87–5.11)
RDW: 16.1 % — ABNORMAL HIGH (ref 11.5–15.5)
WBC: 6.7 10*3/uL (ref 4.0–10.5)
nRBC: 0.3 % — ABNORMAL HIGH (ref 0.0–0.2)

## 2020-07-19 LAB — BASIC METABOLIC PANEL
Anion gap: 10 (ref 5–15)
BUN: 13 mg/dL (ref 8–23)
CO2: 23 mmol/L (ref 22–32)
Calcium: 8.4 mg/dL — ABNORMAL LOW (ref 8.9–10.3)
Chloride: 106 mmol/L (ref 98–111)
Creatinine, Ser: 1.35 mg/dL — ABNORMAL HIGH (ref 0.44–1.00)
GFR calc Af Amer: 44 mL/min — ABNORMAL LOW (ref >60)
GFR calc non Af Amer: 38 mL/min — ABNORMAL LOW (ref >60)
Glucose, Bld: 110 mg/dL — ABNORMAL HIGH (ref 70–99)
Potassium: 4.5 mmol/L (ref 3.5–5.1)
Sodium: 139 mmol/L (ref 135–145)

## 2020-07-19 MED ORDER — OXYCODONE-ACETAMINOPHEN 5-325 MG PO TABS
1.0000 | ORAL_TABLET | Freq: Four times a day (QID) | ORAL | Status: DC | PRN
  Administered 2020-07-19 – 2020-07-23 (×9): 1 via ORAL
  Filled 2020-07-19 (×9): qty 1

## 2020-07-19 MED ORDER — ALUM & MAG HYDROXIDE-SIMETH 200-200-20 MG/5ML PO SUSP
30.0000 mL | Freq: Four times a day (QID) | ORAL | Status: DC | PRN
  Administered 2020-07-19 – 2020-07-23 (×3): 30 mL via ORAL
  Filled 2020-07-19 (×4): qty 30

## 2020-07-19 MED ORDER — SODIUM CHLORIDE 0.9 % IV SOLN
2.0000 g | INTRAVENOUS | Status: DC
  Filled 2020-07-19: qty 20

## 2020-07-19 NOTE — Progress Notes (Signed)
Echocardiogram 2D Echocardiogram has been performed.  Oneal Deputy Sharronda Schweers 07/19/2020, 8:55 AM

## 2020-07-19 NOTE — Evaluation (Signed)
Physical Therapy Evaluation Patient Details Name: [name] MRN: [id number] DOB: 1941-06-27 Today's Date: 07/19/2020   History of Present Illness  79 y.o. female with medical history significant for CAD, diastolic CHF, hypertension, hyperlipidemia, osteoarthritis, paroxysmal atrial fibrillation, history of third-degree heart block, has pacemaker.  She presents by EMS with complaint of chest pain. She had associated shortness of breath, nausea, vomiting and some upper abdominal discomfort and indigestion. Pt admitted for NSTEMI, PNA, UTI. PT underwent cardiac cath on 07/18/2020 showing distal small vessel diffuse disease.  Clinical Impression  Pt presents to PT with deficits in functional mobility, gait, balance, strength, power, endurance, and with pain. Pt demonstrates significantly reduced activity tolerance, performing transfers and brief periods of standing before requiring a return to sitting. Pt also requires physical assistance to perform all transfers at this time due to LE weakness. Pt will benefit from acute PT POC to improve mobility quality and tolerance while also reducing falls risk. Pt will benefit from having a bariatric RW and bariatric bedside commode to utilize during this admission, RN made aware. PT recommends SNF placement at the time of discharge to aide in improving mobility quality.    Follow Up Recommendations SNF    Equipment Recommendations  Other (comment) (bariatric bedside commode)    Recommendations for Other Services       Precautions / Restrictions Precautions Precautions: Fall Restrictions Weight Bearing Restrictions: No      Mobility  Bed Mobility               General bed mobility comments: pt received and left seated in recliner  Transfers Overall transfer level: Needs assistance Equipment used: Rolling walker (2 wheeled) Transfers: Sit to/from Stand Sit to Stand: Mod assist         General transfer comment: PT encourages  forward trunk lean during transfer  Ambulation/Gait Ambulation/Gait assistance: Min assist Gait Distance (Feet): 1 Feet Assistive device: Rolling walker (2 wheeled) Gait Pattern/deviations: Shuffle Gait velocity: reduced Gait velocity interpretation: <1.31 ft/sec, indicative of household ambulator General Gait Details: pt takes one step forward, fatigues rapidly and requires assistance to maintain balance, increased forward trunk lean  Stairs            Wheelchair Mobility    Modified Rankin (Stroke Patients Only)       Balance Overall balance assessment: Needs assistance Sitting-balance support: Bilateral upper extremity supported;Feet supported Sitting balance-Leahy Scale: Poor Sitting balance - Comments: reliant on BUE support of recliner   Standing balance support: Bilateral upper extremity supported Standing balance-Leahy Scale: Poor Standing balance comment: min-modA with BUE support of RW                             Pertinent Vitals/Pain Pain Assessment: Faces Faces Pain Scale: Hurts little more Pain Location: generalized Pain Descriptors / Indicators: Grimacing Pain Intervention(s): Monitored during session    Home Living Family/patient expects to be discharged to:: Private residence Living Arrangements: Alone Available Help at Discharge: Family;Personal care attendant;Available PRN/intermittently Type of Home: House Home Access: Stairs to enter Entrance Stairs-Rails: None Entrance Stairs-Number of Steps: 2 Home Layout: One level Home Equipment: Walker - 2 wheels;Wheelchair - [employer];Shower seat;Hand held shower head;Grab bars - tub/shower (lift chair) Additional Comments: pt receives home health aide assistance 5 days/week for 1.5 hours/day. Aide assists with ADLs and IADLs    Prior Function Level of Independence: Needs assistance   Gait / Transfers Assistance Needed: pt ambulates short  household distances independently with  use of RW  ADL's / Homemaking Assistance Needed: pt requires assistance for all ADLs and IADLs        Hand Dominance   Dominant Hand: Right    Extremity/Trunk Assessment   Upper Extremity Assessment Upper Extremity Assessment: Generalized weakness    Lower Extremity Assessment Lower Extremity Assessment: Generalized weakness    Cervical / Trunk Assessment Cervical / Trunk Assessment: Other exceptions Cervical / Trunk Exceptions: morbid obesity  Communication   Communication: No difficulties  Cognition Arousal/Alertness: Awake/alert Behavior During Therapy: WFL for tasks assessed/performed Overall Cognitive Status: Within Functional Limits for tasks assessed                                        General Comments General comments (skin integrity, edema, etc.): pt on RA upon PT arrival, sats ranging from 90-94% at rest. With activity pt desats to 87% but does return to 90s with seated rest and use of pursed lip breathing techniques. Pt fatigues rapidly.    Exercises     Assessment/Plan    PT Assessment Patient needs continued PT services  PT Problem List Decreased strength;Decreased activity tolerance;Decreased balance;Decreased mobility;Decreased knowledge of use of DME;Cardiopulmonary status limiting activity;Pain       PT Treatment Interventions DME instruction;Gait training;Stair training;Functional mobility training;Therapeutic activities;Therapeutic exercise;Balance training;Neuromuscular re-education;Patient/family education    PT Goals (Current goals can be found in the Care Plan section)  Acute Rehab PT Goals Patient Stated Goal: To improve activity tolerance and mobility quality PT Goal Formulation: With patient Time For Goal Achievement: 08/02/20 Potential to Achieve Goals: Fair    Frequency Min 2X/week   Barriers to discharge        Co-evaluation               AM-PAC PT "6 Clicks" Mobility  Outcome Measure Help needed  turning from your back to your side while in a flat bed without using bedrails?: A Lot Help needed moving from lying on your back to sitting on the side of a flat bed without using bedrails?: A Lot Help needed moving to and from a bed to a chair (including a wheelchair)?: A Lot Help needed standing up from a chair using your arms (e.g., wheelchair or bedside chair)?: A Lot Help needed to walk in hospital room?: A Lot Help needed climbing 3-5 steps with a railing? : Total 6 Click Score: 11    End of Session   Activity Tolerance: Patient limited by fatigue Patient left: in chair;with call bell/phone within reach Nurse Communication: Mobility status PT Visit Diagnosis: Unsteadiness on feet (R26.81);Other abnormalities of gait and mobility (R26.89);Muscle weakness (generalized) (M62.81);Other (comment) (cardiopulmonary impairments)    Time: 1540-0867 PT Time Calculation (min) (ACUTE ONLY): 23 min   Charges:   PT Evaluation $PT Eval Moderate Complexity: 1 Mod          Zenaida Niece, PT, DPT Acute Rehabilitation Pager: [phone]   Zenaida Niece 07/19/2020, 2:16 PM
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
## 2020-07-19 NOTE — Progress Notes (Signed)
Subjective:  Patient denies any anginal chest pain.  Underwent left cardiac catheterization with selective left and right coronary angiography and noting the graft.  Via left radial approach yesterday tolerated the procedure well.  Objective:  Vital Signs in the last 24 hours: Temp:  [98.9 F (37.2 C)-99.1 F (37.3 C)] 99.1 F (37.3 C) (09/25 0800) Pulse Rate:  [60-91] 85 (09/25 0800) Resp:  [0-57] 18 (09/25 0800) BP: (106-191)/(49-100) 106/49 (09/25 0800) SpO2:  [92 %-99 %] 94 % (09/25 0800)  Intake/Output from previous day: 09/24 0701 - 09/25 0700 In: 207.9 [I.V.:107.9; IV Piggyback:100] Out: 1850 [Urine:1850] Intake/Output from this shift: No intake/output data recorded.  Physical Exam: Neck: no adenopathy, no carotid bruit, no JVD and supple, symmetrical, trachea midline Lungs: Decreased breath sound at bases Heart: regular rate and rhythm, S1, S2 normal and Soft systolic murmur noted Abdomen: soft, non-tender; bowel sounds normal; no masses,  no organomegaly Extremities: No clubbing cyanosis 2+ edema noted Left radial artery site okay Lab Results: Recent Labs    07/18/20 0055 07/19/20 0115  WBC 7.2 6.7  HGB 10.8* 10.6*  PLT 145* 125*   Recent Labs    07/18/20 0055 07/19/20 0115  NA 139 139  K 4.4 4.5  CL 108 106  CO2 23 23  GLUCOSE 97 110*  BUN 17 13  CREATININE 1.50* 1.35*   No results for input(s): TROPONINI in the last 72 hours.  Invalid input(s): CK, MB Hepatic Function Panel No results for input(s): PROT, ALBUMIN, AST, ALT, ALKPHOS, BILITOT, BILIDIR, IBILI in the last 72 hours. No results for input(s): CHOL in the last 72 hours. No results for input(s): PROTIME in the last 72 hours.  Imaging: Imaging results have been reviewed and CARDIAC CATHETERIZATION  Result Date: 07/18/2020  Mid LAD lesion is 30% stenosed.  Ost LAD to Prox LAD lesion is 30% stenosed.  Medical treatment.    Cardiac Studies:  Assessment/Plan:  Acute coronary syndrome  status post left cardiac catheterization with distal small vessel diffuse disease Hypertension History of paroxysmal A. 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   LOS: 3 days    Charolette Forward 07/19/2020, 9:40 AM

## 2020-07-20 ENCOUNTER — Encounter (HOSPITAL_COMMUNITY): Payer: Medicare Other

## 2020-07-20 MED ORDER — FUROSEMIDE 40 MG PO TABS
40.0000 mg | ORAL_TABLET | Freq: Once | ORAL | Status: AC
  Administered 2020-07-20: 40 mg via ORAL
  Filled 2020-07-20: qty 1

## 2020-07-20 MED ORDER — CEPHALEXIN 500 MG PO CAPS
500.0000 mg | ORAL_CAPSULE | Freq: Four times a day (QID) | ORAL | Status: DC
  Administered 2020-07-20 – 2020-07-21 (×5): 500 mg via ORAL
  Filled 2020-07-20 (×6): qty 1

## 2020-07-20 MED ORDER — METOPROLOL TARTRATE 12.5 MG HALF TABLET
12.5000 mg | ORAL_TABLET | Freq: Two times a day (BID) | ORAL | Status: DC
  Administered 2020-07-20 – 2020-07-25 (×11): 12.5 mg via ORAL
  Filled 2020-07-20 (×11): qty 1

## 2020-07-20 NOTE — Progress Notes (Signed)
Pt's rhythm throughout the day was between NSR, fire pacers and A-fib, Md aware, metoprolol resumed, pt sat in a recliner pretty much whole day, walking to the bathroom and in a room, oxygen given just for the comfort this evening, will continue to monitor the patient  Palma Holter, RN
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
## 2020-07-20 NOTE — Progress Notes (Signed)
Subjective:  Patient denies any chest pain or shortness of breath had episode of A. fib with moderate ventricular response earlier converted back to sinus now.  States overall feels better than yesterday up in chair  Objective:  Vital Signs in the last 24 hours: Temp:  [98.4 F (36.9 C)-99 F (37.2 C)] 98.8 F (37.1 C) (09/26 0723) Pulse Rate:  [77-85] 78 (09/26 0723) Resp:  [15-20] 17 (09/26 0723) BP: (92-124)/(52-72) 122/52 (09/26 0723) SpO2:  [92 %-100 %] 99 % (09/26 0723)  Intake/Output from previous day: 09/25 0701 - 09/26 0700 In: 720 [P.O.:720] Out: 400 [Urine:400] Intake/Output from this shift: Total I/O In: 240 [P.O.:240] Out: 400 [Urine:400]  Physical Exam: Exam unchanged  Lab Results: Recent Labs    07/18/20 0055 07/19/20 0115  WBC 7.2 6.7  HGB 10.8* 10.6*  PLT 145* 125*   Recent Labs    07/18/20 0055 07/19/20 0115  NA 139 139  K 4.4 4.5  CL 108 106  CO2 23 23  GLUCOSE 97 110*  BUN 17 13  CREATININE 1.50* 1.35*   No results for input(s): TROPONINI in the last 72 hours.  Invalid input(s): CK, MB Hepatic Function Panel No results for input(s): PROT, ALBUMIN, AST, ALT, ALKPHOS, BILITOT, BILIDIR, IBILI in the last 72 hours. No results for input(s): CHOL in the last 72 hours. No results for input(s): PROTIME in the last 72 hours.  Imaging: Imaging results have been reviewed and No results found.  Cardiac Studies:  Assessment/Plan:  Acute coronary syndrome status post left cardiac catheterization with distal small vessel diffuse disease Hypertension History of paroxysmal A. 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 Continue amiodarone Awaiting disposition Increase ambulation as tolerated  LOS: 4 days    Charolette Forward 07/20/2020, 11:25 AM

## 2020-07-20 NOTE — Progress Notes (Signed)
Pt had a complain of right leg pain, some redness noticed over rt lower leg and skin warm to touch, informed to MD, VAS [place] is in place to rule out DVT, will closely monitor  Palma Holter, RN

## 2020-07-20 NOTE — Progress Notes (Signed)
Dr. Pietro Cassis came to bedside and d/c'd order to start IV

## 2020-07-21 ENCOUNTER — Inpatient Hospital Stay (HOSPITAL_COMMUNITY): Payer: Medicare Other

## 2020-07-21 DIAGNOSIS — L538 Other specified erythematous conditions: Secondary | ICD-10-CM

## 2020-07-21 DIAGNOSIS — M7989 Other specified soft tissue disorders: Secondary | ICD-10-CM

## 2020-07-21 LAB — CBC WITH DIFFERENTIAL/PLATELET
Abs Immature Granulocytes: 0.11 10*3/uL — ABNORMAL HIGH (ref 0.00–0.07)
Basophils Absolute: 0.1 10*3/uL (ref 0.0–0.1)
Basophils Relative: 1 %
Eosinophils Absolute: 0.6 10*3/uL — ABNORMAL HIGH (ref 0.0–0.5)
Eosinophils Relative: 8 %
HCT: 30.9 % — ABNORMAL LOW (ref 36.0–46.0)
Hemoglobin: 9.6 g/dL — ABNORMAL LOW (ref 12.0–15.0)
Immature Granulocytes: 2 %
Lymphocytes Relative: 20 %
Lymphs Abs: 1.4 10*3/uL (ref 0.7–4.0)
MCH: 31.2 pg (ref 26.0–34.0)
MCHC: 31.1 g/dL (ref 30.0–36.0)
MCV: 100.3 fL — ABNORMAL HIGH (ref 80.0–100.0)
Monocytes Absolute: 0.8 10*3/uL (ref 0.1–1.0)
Monocytes Relative: 12 %
Neutro Abs: 4.1 10*3/uL (ref 1.7–7.7)
Neutrophils Relative %: 57 %
Platelets: 158 10*3/uL (ref 150–400)
RBC: 3.08 MIL/uL — ABNORMAL LOW (ref 3.87–5.11)
RDW: 15.9 % — ABNORMAL HIGH (ref 11.5–15.5)
WBC: 7.1 10*3/uL (ref 4.0–10.5)
nRBC: 0.4 % — ABNORMAL HIGH (ref 0.0–0.2)

## 2020-07-21 LAB — CULTURE, BLOOD (ROUTINE X 2)
Culture: NO GROWTH
Culture: NO GROWTH
Special Requests: ADEQUATE
Special Requests: ADEQUATE

## 2020-07-21 LAB — BASIC METABOLIC PANEL
Anion gap: 8 (ref 5–15)
BUN: 15 mg/dL (ref 8–23)
CO2: 25 mmol/L (ref 22–32)
Calcium: 8.5 mg/dL — ABNORMAL LOW (ref 8.9–10.3)
Chloride: 105 mmol/L (ref 98–111)
Creatinine, Ser: 1.46 mg/dL — ABNORMAL HIGH (ref 0.44–1.00)
GFR calc Af Amer: 40 mL/min — ABNORMAL LOW (ref >60)
GFR calc non Af Amer: 35 mL/min — ABNORMAL LOW (ref >60)
Glucose, Bld: 92 mg/dL (ref 70–99)
Potassium: 4.7 mmol/L (ref 3.5–5.1)
Sodium: 138 mmol/L (ref 135–145)

## 2020-07-21 MED ORDER — SODIUM CHLORIDE 0.9 % IV SOLN
2.0000 g | INTRAVENOUS | Status: DC
  Administered 2020-07-21 – 2020-07-25 (×5): 2 g via INTRAVENOUS
  Filled 2020-07-21 (×2): qty 20
  Filled 2020-07-21: qty 2
  Filled 2020-07-21: qty 20
  Filled 2020-07-21 (×2): qty 2

## 2020-07-21 MED FILL — Verapamil HCl IV Soln 2.5 MG/ML: INTRAVENOUS | Qty: 2 | Status: AC

## 2020-07-21 MED FILL — Heparin Sod (Porcine)-NaCl IV Soln 1000 Unit/500ML-0.9%: INTRAVENOUS | Qty: 1000 | Status: AC

## 2020-07-21 NOTE — Progress Notes (Signed)
CSW met with patient at bedside, discussed doctor and PT recommendation of SNF, patient advised CSW that she was not interested in going to SNF, stated that she has a home health aide that comes in 5x per week. Patient also stated that her grandson will be moving in with her to help. CSW will let RNCM know to follow.

## 2020-07-21 NOTE — Progress Notes (Signed)
Lower extremity venous RT study completed.   Please see CV Proc for preliminary results.   Maleke Feria, RDMS  
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
## 2020-07-22 ENCOUNTER — Inpatient Hospital Stay (HOSPITAL_COMMUNITY): Payer: Medicare Other

## 2020-07-22 LAB — CBC WITH DIFFERENTIAL/PLATELET
Abs Immature Granulocytes: 0.06 10*3/uL (ref 0.00–0.07)
Basophils Absolute: 0.1 10*3/uL (ref 0.0–0.1)
Basophils Relative: 1 %
Eosinophils Absolute: 0.4 10*3/uL (ref 0.0–0.5)
Eosinophils Relative: 7 %
HCT: 33.5 % — ABNORMAL LOW (ref 36.0–46.0)
Hemoglobin: 10.5 g/dL — ABNORMAL LOW (ref 12.0–15.0)
Immature Granulocytes: 1 %
Lymphocytes Relative: 19 %
Lymphs Abs: 1.2 10*3/uL (ref 0.7–4.0)
MCH: 31.3 pg (ref 26.0–34.0)
MCHC: 31.3 g/dL (ref 30.0–36.0)
MCV: 100 fL (ref 80.0–100.0)
Monocytes Absolute: 0.7 10*3/uL (ref 0.1–1.0)
Monocytes Relative: 11 %
Neutro Abs: 3.7 10*3/uL (ref 1.7–7.7)
Neutrophils Relative %: 61 %
Platelets: 166 10*3/uL (ref 150–400)
RBC: 3.35 MIL/uL — ABNORMAL LOW (ref 3.87–5.11)
RDW: 15.9 % — ABNORMAL HIGH (ref 11.5–15.5)
WBC: 6.2 10*3/uL (ref 4.0–10.5)
nRBC: 0.3 % — ABNORMAL HIGH (ref 0.0–0.2)

## 2020-07-22 LAB — BASIC METABOLIC PANEL
Anion gap: 10 (ref 5–15)
BUN: 14 mg/dL (ref 8–23)
CO2: 24 mmol/L (ref 22–32)
Calcium: 8.6 mg/dL — ABNORMAL LOW (ref 8.9–10.3)
Chloride: 104 mmol/L (ref 98–111)
Creatinine, Ser: 1.37 mg/dL — ABNORMAL HIGH (ref 0.44–1.00)
GFR calc Af Amer: 43 mL/min — ABNORMAL LOW (ref >60)
GFR calc non Af Amer: 37 mL/min — ABNORMAL LOW (ref >60)
Glucose, Bld: 125 mg/dL — ABNORMAL HIGH (ref 70–99)
Potassium: 4.4 mmol/L (ref 3.5–5.1)
Sodium: 138 mmol/L (ref 135–145)

## 2020-07-22 LAB — TRANSFERRIN: Transferrin: 160 mg/dL — ABNORMAL LOW (ref 192–382)

## 2020-07-22 LAB — LACTIC ACID, PLASMA: Lactic Acid, Venous: 1.6 mmol/L (ref 0.5–1.9)

## 2020-07-22 LAB — PROCALCITONIN: Procalcitonin: 1.05 ng/mL

## 2020-07-22 LAB — MAGNESIUM: Magnesium: 1.8 mg/dL (ref 1.7–2.4)

## 2020-07-22 LAB — FOLATE: Folate: 11.2 ng/mL (ref >5.9)

## 2020-07-22 LAB — VITAMIN B12: Vitamin B-12: 219 pg/mL (ref 180–914)

## 2020-07-22 LAB — IRON AND TIBC
Iron: 25 ug/dL — ABNORMAL LOW (ref 28–170)
Saturation Ratios: 11 % (ref 10.4–31.8)
TIBC: 224 ug/dL — ABNORMAL LOW (ref 250–450)
UIBC: 199 ug/dL

## 2020-07-22 LAB — FERRITIN: Ferritin: 140 ng/mL (ref 11–307)

## 2020-07-22 MED ORDER — IOHEXOL 300 MG/ML  SOLN
100.0000 mL | Freq: Once | INTRAMUSCULAR | Status: AC | PRN
  Administered 2020-07-22: 100 mL via INTRAVENOUS

## 2020-07-22 MED ORDER — FUROSEMIDE 20 MG PO TABS
20.0000 mg | ORAL_TABLET | Freq: Every day | ORAL | Status: DC
  Administered 2020-07-22 – 2020-07-25 (×4): 20 mg via ORAL
  Filled 2020-07-22 (×4): qty 1
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
## 2020-07-22 NOTE — Progress Notes (Signed)
Physical Therapy Treatment Patient Details Name: [name] MRN: [id number] DOB: 18-Mar-1941 Today's Date: 07/22/2020    History of Present Illness 79 y.o. female with medical history significant for CAD, diastolic CHF, hypertension, hyperlipidemia, osteoarthritis, paroxysmal atrial fibrillation, history of third-degree heart block, has pacemaker.  She presents by EMS with complaint of chest pain. She had associated shortness of breath, nausea, vomiting and some upper abdominal discomfort and indigestion. Pt admitted for NSTEMI, PNA, UTI. PT underwent cardiac cath on 07/18/2020 showing distal small vessel diffuse disease.    PT Comments    Pt received in recliner with c/o R hip pain. She reports having just transferred to recliner after using BSC, all with +2 nursing assist and RW. Sit to stand with RW x 2 trials mod assist. Pillow placed under R hip in recliner to assist with pain management. Feet elevated with folded blanket placed under R distal LE. Pt reports improved comfort in recliner with these measures in place. Pt instructed to perform ankle pumps throughout the day. Recommending SNF but pt declining. She has PCA 5 days/week for 1.5 hrs/day. Her great grandson is planning to move in with her. She ambulated household distances at baseline with RW but does have a w/c at home if needed.   Follow Up Recommendations  SNF;Supervision/Assistance - 24 hour     Equipment Recommendations  3in1 (PT) (bariatric BSC)    Recommendations for Other Services       Precautions / Restrictions Precautions Precautions: Fall Restrictions Weight Bearing Restrictions: No    Mobility  Bed Mobility               General bed mobility comments: pt received and left seated in recliner  Transfers Overall transfer level: Needs assistance Equipment used: Rolling walker (2 wheeled) Transfers: Sit to/from Stand Sit to Stand: Mod assist         General transfer comment: sit to stand  from recliner x 2 trials, increased time, pt using momentum to power up, cues for posture  Ambulation/Gait                 Stairs             Wheelchair Mobility    Modified Rankin (Stroke Patients Only)       Balance Overall balance assessment: Needs assistance Sitting-balance support: Feet supported;No upper extremity supported Sitting balance-Leahy Scale: Fair     Standing balance support: Bilateral upper extremity supported Standing balance-Leahy Scale: Poor Standing balance comment: reliant on external support                            Cognition Arousal/Alertness: Awake/alert Behavior During Therapy: WFL for tasks assessed/performed Overall Cognitive Status: Within Functional Limits for tasks assessed                                        Exercises General Exercises - Lower Extremity Ankle Circles/Pumps: AROM;Both;20 reps    General Comments General comments (skin integrity, edema, etc.): Pt on 2L O2 with SpO2 > 90%.      Pertinent Vitals/Pain Pain Assessment: Faces Faces Pain Scale: Hurts even more Pain Location: R hip/LE Pain Descriptors / Indicators: Discomfort;Grimacing Pain Intervention(s): Monitored during session;Repositioned    Home Living  Prior Function            PT Goals (current goals can now be found in the care plan section) Acute Rehab PT Goals Patient Stated Goal: home Progress towards PT goals: Progressing toward goals    Frequency    Min 3X/week      PT Plan Current plan remains appropriate;Frequency needs to be updated    Co-evaluation              AM-PAC PT "6 Clicks" Mobility   Outcome Measure  Help needed turning from your back to your side while in a flat bed without using bedrails?: A Lot Help needed moving from lying on your back to sitting on the side of a flat bed without using bedrails?: A Lot Help needed moving to and from a bed to  a chair (including a wheelchair)?: A Lot Help needed standing up from a chair using your arms (e.g., wheelchair or bedside chair)?: A Lot Help needed to walk in hospital room?: A Lot Help needed climbing 3-5 steps with a railing? : Total 6 Click Score: 11    End of Session Equipment Utilized During Treatment: Gait belt;Oxygen Activity Tolerance: Patient limited by fatigue;Patient limited by pain Patient left: in chair;with call bell/phone within reach Nurse Communication: Mobility status PT Visit Diagnosis: Unsteadiness on feet (R26.81);Other abnormalities of gait and mobility (R26.89);Muscle weakness (generalized) (M62.81);Other (comment)     Time: 6734-1937 PT Time Calculation (min) (ACUTE ONLY): 17 min  Charges:  $Therapeutic Activity: 8-22 mins                     Lorrin Goodell, PT  Office # [phone] Pager [phone]    Lorriane Shire 07/22/2020, 10:38 AM

## 2020-07-22 NOTE — Care Management Important Message (Signed)
Important Message  Patient Details  Name: [name] MRN: [id number] Date of Birth: 13-Dec-1940   Medicare Important Message Given:  Yes     Orbie Pyo 07/22/2020, 3:32 PM

## 2020-07-22 NOTE — TOC Progression Note (Addendum)
Transition of Care [facility]) - Progression Note    Patient Details  Name: [name] MRN: [id number] Date of Birth: Dec 20, 1940  [facility]) CM/SW Contact  Zenon Mayo, RN Phone Number: 07/22/2020, 9:25 AM  Clinical Narrative:    NCM contacted Luellen Pucker with [employer] ,she states patient is still active with them for [facility] [place], and [place]. Will need to get resumption orders prior to dc.  NCM informed MD to put orders in for [facility] services.        Expected Discharge Plan and Services                                                 Social Determinants of Health (SDOH) Interventions    Readmission Risk Interventions Readmission Risk Prevention Plan 05/28/2020  Transportation Screening Complete  PCP or Specialist Appt within 3-5 Days Complete  HRI or [place] Complete  Social Work Consult for [place] Planning/Counseling Complete  Palliative Care Screening Not Applicable  Medication Review Press photographer) Complete  Some recent data might be hidden

## 2020-07-22 NOTE — Progress Notes (Signed)
CSW received call from [facility], stated pt currently receives PT, OT, and nursing services once or twice a week.

## 2020-07-23 LAB — ECHOCARDIOGRAM LIMITED
Height: 66 in
Weight: 5925.96 oz

## 2020-07-23 LAB — PROCALCITONIN: Procalcitonin: 0.66 ng/mL

## 2020-07-23 MED ORDER — OXYCODONE-ACETAMINOPHEN 5-325 MG PO TABS
1.0000 | ORAL_TABLET | ORAL | Status: DC | PRN
  Administered 2020-07-23 – 2020-07-25 (×9): 1 via ORAL
  Filled 2020-07-23 (×9): qty 1

## 2020-07-23 NOTE — Evaluation (Signed)
Occupational Therapy Evaluation Patient Details Name: [name] MRN: [id number] DOB: [birth year] Today's Date: 07/23/2020    History of Present Illness Pt is 79 y.o. female with medical history significant for CAD, diastolic CHF, hypertension, hyperlipidemia, osteoarthritis, paroxysmal atrial fibrillation, history of third-degree heart block, has pacemaker.  She presents by EMS with complaint of chest pain. She had associated shortness of breath, nausea, vomiting and some upper abdominal discomfort and indigestion. Pt admitted for NSTEMI, PNA, UTI. PT underwent cardiac cath on 07/18/2020 showing distal small vessel diffuse disease.   Clinical Impression   Pt walks short distances with RW and is assisted for bathing, dressing, heavy meal prep and housekeeping by an aide who comes 1/1/2 hours per day. Pt is able to self feed, take herself to the bathroom and assemble a simple meal. Pt presents with generalized weakness, decreased activity tolerance and impaired standing balance. She requires set up to total assist for ADL. Pt could benefit from SNF for further rehab prior to returning home, but is declining due to fear of COVID. Will follow acutely.     Follow Up Recommendations  SNF;Supervision/Assistance - 24 hour (HHOT if pt continues to decline SNF)    Equipment Recommendations  3 in 1 bedside commode (bariatric)    Recommendations for Other Services       Precautions / Restrictions Precautions Precautions: Fall      Mobility Bed Mobility Overal bed mobility: Needs Assistance Bed Mobility: Sit to Supine;Supine to Sit     Supine to sit: Mod assist Sit to supine: Mod assist   General bed mobility comments: Required assist with bil legs and trunk OOB and with LEs back into bed  Transfers Overall transfer level: Needs assistance Equipment used: Rolling walker (2 wheeled) Transfers: Sit to/from Stand Sit to Stand: Mod assist Stand pivot transfers: Min assist;+2  physical assistance;+2 safety/equipment       General transfer comment: increased time, use of momentum, cues for technique    Balance Overall balance assessment: Needs assistance Sitting-balance support: Feet supported;No upper extremity supported Sitting balance-Leahy Scale: Good     Standing balance support: Bilateral upper extremity supported Standing balance-Leahy Scale: Poor Standing balance comment: reliant on RW and external support                           ADL either performed or assessed with clinical judgement   ADL Overall ADL's : Needs assistance/impaired Eating/Feeding: Independent;Bed level   Grooming: Wash/dry hands;Wash/dry face;Sitting;Set up   Upper Body Bathing: Moderate assistance;Sitting   Lower Body Bathing: Total assistance;+2 for physical assistance;Sit to/from stand   Upper Body Dressing : Minimal assistance;Sitting   Lower Body Dressing: Total assistance;+2 for physical assistance;Sit to/from stand   Toilet Transfer: +2 for physical assistance;Minimal assistance;Stand-pivot;RW;BSC   Toileting- Clothing Manipulation and Hygiene: Total assistance;+2 for physical assistance;Sit to/from stand               Vision Baseline Vision/History: Wears glasses Wears Glasses: At all times Patient Visual Report: No change from baseline       Perception     Praxis      Pertinent Vitals/Pain Pain Assessment: Faces Faces Pain Scale: Hurts whole lot Pain Location: R lower leg Pain Descriptors / Indicators: Grimacing;Sharp Pain Intervention(s): Monitored during session;Repositioned     Hand Dominance Right   Extremity/Trunk Assessment Upper Extremity Assessment Upper Extremity Assessment: Generalized weakness   Lower Extremity Assessment Lower Extremity Assessment: Defer to PT evaluation  Cervical / Trunk Assessment Cervical / Trunk Assessment: Other exceptions Cervical / Trunk Exceptions: morbid obesity   Communication  Communication Communication: No difficulties   Cognition Arousal/Alertness: Awake/alert Behavior During Therapy: WFL for tasks assessed/performed Overall Cognitive Status: Within Functional Limits for tasks assessed                                     General Comments  Pt on 2 L O2 with SpO2 >94% .  Discussed recommendation for SNF due to limited mobility.  Pt reports she would like to go to SNF but is too afraid of COVID.  States her great grandson who is 31 is coming to stay with her at home.  Has a w/c that will fit through doorways. Reports BSC is broke and needs a new one.  Has multiple RWs/rollators.  Does have 2 steps to enter home - will likely need EMS transport to get into home safely. Grandson could boost down steps in w/c in emergency.    Exercises     Shoulder Instructions      Home Living Family/patient expects to be discharged to:: Private residence Living Arrangements: Alone Available Help at Discharge: Family;Personal care attendant;Available PRN/intermittently Type of Home: House Home Access: Stairs to enter [employer] of Steps: 2 Entrance Stairs-Rails: None Home Layout: One level     Bathroom Shower/Tub: Occupational psychologist: Standard     Home Equipment: Environmental consultant - 2 wheels;Wheelchair - [employer];Shower seat;Hand held shower head;Grab bars - tub/shower;Other (comment) (lift chair)   Additional Comments: pt receives home health aide assistance 5 days/week for 1.5 hours/day. Aide assists with ADLs and IADLs      Prior Functioning/Environment Level of Independence: Needs assistance  Gait / Transfers Assistance Needed: pt ambulates short household distances independently with use of RW ADL's / Homemaking Assistance Needed: assisted for bathing and dressing, can self feed and prepare a light meal            OT Problem List: Decreased strength;Decreased activity tolerance;Impaired balance (sitting and/or  standing);Decreased knowledge of use of DME or AE;Obesity;Pain      OT Treatment/Interventions: Self-care/ADL training;DME and/or AE instruction;Patient/family education;Therapeutic activities    OT Goals(Current goals can be found in the care plan section) Acute Rehab OT Goals Patient Stated Goal: home OT Goal Formulation: With patient Time For Goal Achievement: 08/06/20 Potential to Achieve Goals: Good ADL Goals Pt Will Perform Grooming: with supervision;standing Pt Will Perform Upper Body Bathing: with min assist;sitting Pt Will Perform Upper Body Dressing: with set-up;with supervision;sitting Pt Will Transfer to Toilet: with supervision;ambulating;bedside commode Pt Will Perform Toileting - Clothing Manipulation and hygiene: with min assist;sit to/from stand Additional ADL Goal #1: Pt will perform bed mobility with min assist in preparation for ADL.  OT Frequency: Min 2X/week   Barriers to D/C:            Co-evaluation              AM-PAC OT "6 Clicks" Daily Activity     Outcome Measure Help from another person eating meals?: None Help from another person taking care of personal grooming?: A Little Help from another person toileting, which includes using toliet, bedpan, or urinal?: A Lot Help from another person bathing (including washing, rinsing, drying)?: A Lot Help from another person to put on and taking off regular upper body clothing?: A Little Help from another person to  put on and taking off regular lower body clothing?: Total 6 Click Score: 15   End of Session Equipment Utilized During Treatment: Rolling walker;Gait belt  Activity Tolerance: Patient tolerated treatment well Patient left: in bed;with call bell/phone within reach  OT Visit Diagnosis: Unsteadiness on feet (R26.81);Pain;Other abnormalities of gait and mobility (R26.89);Muscle weakness (generalized) (M62.81)                Time: 1344-1400 OT Time Calculation (min): 16 min Charges:  OT  General Charges $OT Visit: 1 Visit OT Evaluation $OT Eval Moderate Complexity: 1 Mod  Nestor Lewandowsky, OTR/L Acute Rehabilitation Services Pager: [phone] Office: [phone] [name] 07/23/2020, 3:17 PM

## 2020-07-23 NOTE — Progress Notes (Signed)
Physical Therapy Treatment Patient Details Name: [name] MRN: [id number] DOB: [birth year] Today's Date: 07/23/2020    History of Present Illness Pt is 79 y.o. female with medical history significant for CAD, diastolic CHF, hypertension, hyperlipidemia, osteoarthritis, paroxysmal atrial fibrillation, history of third-degree heart block, has pacemaker.  She presents by EMS with complaint of chest pain. She had associated shortness of breath, nausea, vomiting and some upper abdominal discomfort and indigestion. Pt admitted for NSTEMI, PNA, UTI. PT underwent cardiac cath on 07/18/2020 showing distal small vessel diffuse disease.    PT Comments    Pt making gradual progress but remains limited by fatigue and R leg pain from cellulitis.  She required min-mod A of 2 for all transfers with limited gait distance.  Required cues for transfer techniques.  PT recommends SNF at d/c but pt declining due to fear of COVID.  If going home will need 24 hr assist (pt reports great grandson to come live with her), bariatric BSC, and [facility] services (PT, OT, increased aide hours). She reports she has RWs and w/c.  Additionally, pt has 2 steps to enter home and would need EMS transport to return home. Notified case manager of needs via secure chat.    Follow Up Recommendations  SNF;Supervision/Assistance - 24 hour (SNF but pt refuses  - if home then 24 hr assist and [facility] services)     Equipment Recommendations  3in1 (PT) (bariatric BSC)    Recommendations for Other Services       Precautions / Restrictions Precautions Precautions: Fall    Mobility  Bed Mobility Overal bed mobility: Needs Assistance Bed Mobility: Sit to Supine       Sit to supine: Mod assist;+2 for physical assistance   General bed mobility comments: Required assist with bil legs and trunk  Transfers Overall transfer level: Needs assistance Equipment used: Rolling walker (2 wheeled) Transfers: Sit to/from [employer] Transfers Sit to Stand: Min assist;+2 physical assistance;+2 safety/equipment Stand pivot transfers: Min assist;+2 physical assistance;+2 safety/equipment       General transfer comment: Performed x 2; required cues to get legs under body and lean forward, increased time to power up with cues for posture  Ambulation/Gait Ambulation/Gait assistance: Min assist;+2 safety/equipment Gait Distance (Feet): 5 Feet Assistive device: Rolling walker (2 wheeled) Gait Pattern/deviations: Shuffle     General Gait Details: Took a few steps forward with chair follow; cued for posture and RW; limited by pain   Stairs             Wheelchair Mobility    Modified Rankin (Stroke Patients Only)       Balance Overall balance assessment: Needs assistance Sitting-balance support: Feet supported;No upper extremity supported Sitting balance-Leahy Scale: Good     Standing balance support: Bilateral upper extremity supported Standing balance-Leahy Scale: Poor Standing balance comment: reliant on external support                            Cognition Arousal/Alertness: Awake/alert Behavior During Therapy: WFL for tasks assessed/performed Overall Cognitive Status: Within Functional Limits for tasks assessed                                        Exercises      General Comments General comments (skin integrity, edema, etc.): Pt on 2 L O2 with SpO2 >94% .  Discussed recommendation for SNF due to limited mobility.  Pt reports she would like to go to SNF but is too afraid of COVID.  States her great grandson who is 46 is coming to stay with her at home.  Has a w/c that will fit through doorways. Reports BSC is broke and needs a new one.  Has multiple RWs/rollators.  Does have 2 steps to enter home - will likely need EMS transport to get into home safely. Grandson could boost down steps in w/c in emergency.      Pertinent Vitals/Pain Pain Assessment:  Faces Faces Pain Scale: Hurts whole lot Pain Location: R lower leg Pain Descriptors / Indicators: Grimacing;Sharp Pain Intervention(s): Monitored during session;Repositioned    Home Living                      Prior Function            PT Goals (current goals can now be found in the care plan section) Acute Rehab PT Goals Patient Stated Goal: home PT Goal Formulation: With patient Time For Goal Achievement: 08/02/20 Potential to Achieve Goals: Fair Progress towards PT goals: Progressing toward goals    Frequency    Min 3X/week      PT Plan Current plan remains appropriate    Co-evaluation              AM-PAC PT "6 Clicks" Mobility   Outcome Measure  Help needed turning from your back to your side while in a flat bed without using bedrails?: A Lot Help needed moving from lying on your back to sitting on the side of a flat bed without using bedrails?: A Lot Help needed moving to and from a bed to a chair (including a wheelchair)?: A Lot Help needed standing up from a chair using your arms (e.g., wheelchair or bedside chair)?: A Lot Help needed to walk in hospital room?: A Lot Help needed climbing 3-5 steps with a railing? : Total 6 Click Score: 11    End of Session Equipment Utilized During Treatment: Gait belt;Oxygen Activity Tolerance: Patient limited by fatigue;Patient limited by pain Patient left: with call bell/phone within reach;in bed Nurse Communication: Mobility status;Patient requests pain meds (needs new purewick - notified tech) PT Visit Diagnosis: Unsteadiness on feet (R26.81);Other abnormalities of gait and mobility (R26.89);Muscle weakness (generalized) (M62.81);Other (comment)     Time: 3710-6269 PT Time Calculation (min) (ACUTE ONLY): 28 min  Charges:  $Gait Training: 8-22 mins $Therapeutic Activity: 8-22 mins                     Abran Richard, PT Acute Rehab Services Pager [phone] [facility] [place] 07/23/2020, 1:49 PM
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
## 2020-07-24 NOTE — Consult Note (Signed)
Ref: Pcp, No   Subjective:  Awake. Afebrile. VS stable. Wants to go home instead of SNF.  Objective:  Vital Signs in the last 24 hours: Temp:  [97.5 F (36.4 C)-98.7 F (37.1 C)] 97.5 F (36.4 C) (09/30 1200) Pulse Rate:  [63-94] 94 (09/30 0755) Cardiac Rhythm: Normal sinus rhythm (09/30 0755) Resp:  [12-19] 15 (09/30 0755) BP: (94-143)/(56-84) 143/77 (09/30 1200) SpO2:  [92 %-95 %] 95 % (09/30 0404)  Physical Exam: BP Readings from Last 1 Encounters:  07/24/20 (!) 143/77     Wt Readings from Last 1 Encounters:  07/16/20 (!) 168 kg    Weight change:  Body mass index is 59.78 kg/m. HEENT: [place]/AT, Eyes-Brown, Conjunctiva-Pink, Sclera-Non-icteric Neck: No JVD, No bruit, Trachea midline. Lungs:  Clearing, Bilateral. Cardiac:  Regular rhythm, normal S1 and S2, no S3. II/VI systolic murmur. Abdomen:  Soft, non-tender. BS present. Extremities:  Right leg swelling and wound as before. edema present. No cyanosis. No clubbing. CNS: AxOx3, Cranial nerves grossly intact, moves all 4 extremities.  Skin: Warm and dry.   Intake/Output from previous day: 09/29 0701 - 09/30 0700 In: -  Out: 1000 [Urine:1000]    Lab Results: BMET    Component Value Date/Time   NA 138 07/22/2020 0124   NA 138 07/21/2020 0512   NA 139 07/19/2020 0115   K 4.4 07/22/2020 0124   K 4.7 07/21/2020 0512   K 4.5 07/19/2020 0115   CL 104 07/22/2020 0124   CL 105 07/21/2020 0512   CL 106 07/19/2020 0115   CO2 24 07/22/2020 0124   CO2 25 07/21/2020 0512   CO2 23 07/19/2020 0115   GLUCOSE 125 (H) 07/22/2020 0124   GLUCOSE 92 07/21/2020 0512   GLUCOSE 110 (H) 07/19/2020 0115   BUN 14 07/22/2020 0124   BUN 15 07/21/2020 0512   BUN 13 07/19/2020 0115   CREATININE 1.37 (H) 07/22/2020 0124   CREATININE 1.46 (H) 07/21/2020 0512   CREATININE 1.35 (H) 07/19/2020 0115   CALCIUM 8.6 (L) 07/22/2020 0124   CALCIUM 8.5 (L) 07/21/2020 0512   CALCIUM 8.4 (L) 07/19/2020 0115   GFRNONAA 37 (L) 07/22/2020 0124    GFRNONAA 35 (L) 07/21/2020 0512   GFRNONAA 38 (L) 07/19/2020 0115   GFRAA 43 (L) 07/22/2020 0124   GFRAA 40 (L) 07/21/2020 0512   GFRAA 44 (L) 07/19/2020 0115   CBC    Component Value Date/Time   WBC 6.2 07/22/2020 0124   RBC 3.35 (L) 07/22/2020 0124   HGB 10.5 (L) 07/22/2020 0124   HCT 33.5 (L) 07/22/2020 0124   PLT 166 07/22/2020 0124   MCV 100.0 07/22/2020 0124   MCH 31.3 07/22/2020 0124   MCHC 31.3 07/22/2020 0124   RDW 15.9 (H) 07/22/2020 0124   LYMPHSABS 1.2 07/22/2020 0124   MONOABS 0.7 07/22/2020 0124   EOSABS 0.4 07/22/2020 0124   BASOSABS 0.1 07/22/2020 0124   HEPATIC Function Panel Recent Labs    12/02/19 1145 05/22/20 1615 07/15/20 2111  PROT 7.1 7.0 7.3   HEMOGLOBIN A1C No components found for: HGA1C,  MPG CARDIAC ENZYMES Lab Results  Component Value Date   CKTOTAL 63 09/10/2011   CKMB 1.7 09/10/2011   TROPONINI <0.03 06/16/2015   TROPONINI <0.30 02/07/2013   TROPONINI <0.30 10/17/2012   BNP No results for input(s): PROBNP in the last 8760 hours. TSH No results for input(s): TSH in the last 8760 hours. CHOLESTEROL Recent Labs    05/23/20 0114 07/16/20 0755  CHOL 114 100  Scheduled Meds: . allopurinol  100 mg Oral Daily  . amiodarone  200 mg Oral Daily  . apixaban  5 mg Oral BID  . aspirin EC  81 mg Oral Daily  . furosemide  20 mg Oral Daily  . lidocaine  1 patch Transdermal Q24H  . metoprolol tartrate  12.5 mg Oral BID  . pantoprazole  40 mg Oral BID  . potassium chloride SA  20 mEq Oral BID  . psyllium  1 packet Oral Daily  . rosuvastatin  10 mg Oral QHS  . sodium chloride flush  3 mL Intravenous Q12H  . sodium chloride flush  3 mL Intravenous Q12H   Continuous Infusions: . sodium chloride    . sodium chloride    . cefTRIAXone (ROCEPHIN)  IV 2 g (07/24/20 1036)   PRN Meds:.sodium chloride, sodium chloride, acetaminophen, albuterol, alum & mag hydroxide-simeth, ondansetron (ZOFRAN) IV, oxyCODONE-acetaminophen, sodium chloride  flush, sodium chloride flush  Assessment/Plan: NSTEMI CAD Paroxysmal atrial fibrillation, CHA2DS2VASc score of 8 HTN Morbid obesity Hyperlipidemia Right leg cellulitis S/P stroke S/P pacemaker  Follow diet, activity and medication instructions. F/U in office in 1 week.   LOS: 8 days   Time spent including chart review, lab review, examination, discussion with patient and hospitalist : 30 min   Dixie Dials  MD  07/24/2020, 2:18 PM

## 2020-07-24 NOTE — Consult Note (Signed)
Late entry. Ref: Pcp, No   Subjective:  Feeling better except right lower leg edema and pain. IV antibiotics continues.  Objective:  Vital Signs in the last 24 hours: Temp:  [97.5 F (36.4 C)-98.7 F (37.1 C)] 97.5 F (36.4 C) (09/30 1200) Pulse Rate:  [63-94] 94 (09/30 0755) Cardiac Rhythm: Normal sinus rhythm (09/30 0755) Resp:  [12-19] 15 (09/30 0755) BP: (94-143)/(56-84) 143/77 (09/30 1200) SpO2:  [92 %-95 %] 95 % (09/30 0404)  Physical Exam: BP Readings from Last 1 Encounters:  07/24/20 (!) 143/77     Wt Readings from Last 1 Encounters:  07/16/20 (!) 168 kg    Weight change:  Body mass index is 59.78 kg/m. HEENT: [place]/AT, Eyes-Brown, Conjunctiva-Pink, Sclera-Non-icteric Neck: No JVD, No bruit, Trachea midline. Lungs:  Clearing, Bilateral. Cardiac:  Regular rhythm, normal S1 and S2, no S3. II/VI systolic murmur. Abdomen:  Soft, non-tender. BS present. Extremities:  2 + right lower leg edema with blisters and tenderness present. No cyanosis. No clubbing. CNS: AxOx3, Cranial nerves grossly intact, moves all 4 extremities.  Skin: Warm and dry.   Intake/Output from previous day: 09/29 0701 - 09/30 0700 In: -  Out: 1000 [Urine:1000]    Lab Results: BMET    Component Value Date/Time   NA 138 07/22/2020 0124   NA 138 07/21/2020 0512   NA 139 07/19/2020 0115   K 4.4 07/22/2020 0124   K 4.7 07/21/2020 0512   K 4.5 07/19/2020 0115   CL 104 07/22/2020 0124   CL 105 07/21/2020 0512   CL 106 07/19/2020 0115   CO2 24 07/22/2020 0124   CO2 25 07/21/2020 0512   CO2 23 07/19/2020 0115   GLUCOSE 125 (H) 07/22/2020 0124   GLUCOSE 92 07/21/2020 0512   GLUCOSE 110 (H) 07/19/2020 0115   BUN 14 07/22/2020 0124   BUN 15 07/21/2020 0512   BUN 13 07/19/2020 0115   CREATININE 1.37 (H) 07/22/2020 0124   CREATININE 1.46 (H) 07/21/2020 0512   CREATININE 1.35 (H) 07/19/2020 0115   CALCIUM 8.6 (L) 07/22/2020 0124   CALCIUM 8.5 (L) 07/21/2020 0512   CALCIUM 8.4 (L) 07/19/2020  0115   GFRNONAA 37 (L) 07/22/2020 0124   GFRNONAA 35 (L) 07/21/2020 0512   GFRNONAA 38 (L) 07/19/2020 0115   GFRAA 43 (L) 07/22/2020 0124   GFRAA 40 (L) 07/21/2020 0512   GFRAA 44 (L) 07/19/2020 0115   CBC    Component Value Date/Time   WBC 6.2 07/22/2020 0124   RBC 3.35 (L) 07/22/2020 0124   HGB 10.5 (L) 07/22/2020 0124   HCT 33.5 (L) 07/22/2020 0124   PLT 166 07/22/2020 0124   MCV 100.0 07/22/2020 0124   MCH 31.3 07/22/2020 0124   MCHC 31.3 07/22/2020 0124   RDW 15.9 (H) 07/22/2020 0124   LYMPHSABS 1.2 07/22/2020 0124   MONOABS 0.7 07/22/2020 0124   EOSABS 0.4 07/22/2020 0124   BASOSABS 0.1 07/22/2020 0124   HEPATIC Function Panel Recent Labs    12/02/19 1145 05/22/20 1615 07/15/20 2111  PROT 7.1 7.0 7.3   HEMOGLOBIN A1C No components found for: HGA1C,  MPG CARDIAC ENZYMES Lab Results  Component Value Date   CKTOTAL 63 09/10/2011   CKMB 1.7 09/10/2011   TROPONINI <0.03 06/16/2015   TROPONINI <0.30 02/07/2013   TROPONINI <0.30 10/17/2012   BNP No results for input(s): PROBNP in the last 8760 hours. TSH No results for input(s): TSH in the last 8760 hours. CHOLESTEROL Recent Labs    05/23/20 0114 07/16/20  0755  CHOL 114 100    Scheduled Meds: . allopurinol  100 mg Oral Daily  . amiodarone  200 mg Oral Daily  . apixaban  5 mg Oral BID  . aspirin EC  81 mg Oral Daily  . furosemide  20 mg Oral Daily  . lidocaine  1 patch Transdermal Q24H  . metoprolol tartrate  12.5 mg Oral BID  . pantoprazole  40 mg Oral BID  . potassium chloride SA  20 mEq Oral BID  . psyllium  1 packet Oral Daily  . rosuvastatin  10 mg Oral QHS  . sodium chloride flush  3 mL Intravenous Q12H  . sodium chloride flush  3 mL Intravenous Q12H   Continuous Infusions: . sodium chloride    . sodium chloride    . cefTRIAXone (ROCEPHIN)  IV 2 g (07/24/20 1036)   PRN Meds:.sodium chloride, sodium chloride, acetaminophen, albuterol, alum & mag hydroxide-simeth, ondansetron (ZOFRAN) IV,  oxyCODONE-acetaminophen, sodium chloride flush, sodium chloride flush  Assessment/Plan: NSTEMI CAD Paroxysmal atrial fibrillation HTN Morbid obesity Hyperlipidemia S/P stroke S/P pacemaker Right leg cellulitis  Continue medical treatment. Increase activity as tolerated.   LOS: 8 days   Time spent including chart review, lab review, examination, discussion with patient: 25 min   Dixie Dials  MD  07/24/2020, 2:13 PM

## 2020-07-24 NOTE — Progress Notes (Signed)
    Durable Medical Equipment  (From admission, onward)         Start     Ordered   07/24/20 1016  For home use only DME Hospital bed  Once       Question Answer Comment  Length of Need Lifetime   Patient has (list medical condition): CHF   The above medical condition requires: Patient requires the ability to reposition frequently   Head must be elevated greater than: 30 degrees   Bed type Semi-electric   Trapeze Bar Yes   Support Surface: Gel Overlay      07/24/20 1016

## 2020-07-24 NOTE — Progress Notes (Signed)
    Durable Medical Equipment  (From admission, onward)         Start     Ordered   07/24/20 1640  For home use only DME Bedside commode  Once       Comments: HD  Question:  Patient needs a bedside commode to treat with the following condition  Answer:  Weakness   07/24/20 1640   07/24/20 1016  For home use only DME Hospital bed  Once       Question Answer Comment  Length of Need Lifetime   Patient has (list medical condition): CHF   The above medical condition requires: Patient requires the ability to reposition frequently   Head must be elevated greater than: 30 degrees   Bed type Semi-electric   Trapeze Bar Yes   Support Surface: Gel Overlay      07/24/20 1016

## 2020-07-25 DIAGNOSIS — A419 Sepsis, unspecified organism: Secondary | ICD-10-CM

## 2020-07-25 DIAGNOSIS — L03115 Cellulitis of right lower limb: Secondary | ICD-10-CM

## 2020-07-25 MED ORDER — POTASSIUM CHLORIDE CRYS ER 20 MEQ PO TBCR: 20.0000 meq | EXTENDED_RELEASE_TABLET | Freq: Every day | ORAL | 0 refills | Status: DC

## 2020-07-25 MED ORDER — CEFDINIR 300 MG PO CAPS: 300.0000 mg | ORAL_CAPSULE | Freq: Two times a day (BID) | ORAL | 0 refills | Status: AC

## 2020-07-25 MED ORDER — SACCHAROMYCES BOULARDII 250 MG PO CAPS: 250.0000 mg | ORAL_CAPSULE | Freq: Two times a day (BID) | ORAL | 0 refills | Status: AC

## 2020-07-25 MED ORDER — VITAMIN B-12 1000 MCG PO TABS: 1000.0000 ug | ORAL_TABLET | Freq: Every day | ORAL | 0 refills | Status: AC

## 2020-07-25 MED ORDER — OXYCODONE HCL 5 MG PO TABS
5.0000 mg | ORAL_TABLET | Freq: Four times a day (QID) | ORAL | 0 refills | Status: AC | PRN
Start: 2020-07-25 — End: 2020-07-30

## 2020-07-25 MED ORDER — METOPROLOL TARTRATE 25 MG PO TABS: 12.5000 mg | ORAL_TABLET | Freq: Two times a day (BID) | ORAL | 0 refills | Status: DC

## 2020-07-25 NOTE — Progress Notes (Signed)
Physical Therapy Treatment Patient Details Name: [name] MRN: [id number] DOB: 29-Jun-1941 Today's Date: 07/25/2020    History of Present Illness Pt is 79 y.o. female with medical history significant for CAD, diastolic CHF, hypertension, hyperlipidemia, osteoarthritis, paroxysmal atrial fibrillation, history of third-degree heart block, has pacemaker.  She presents by EMS with complaint of chest pain. She had associated shortness of breath, nausea, vomiting and some upper abdominal discomfort and indigestion. Pt admitted for NSTEMI, PNA, UTI. PT underwent cardiac cath on 07/18/2020 showing distal small vessel diffuse disease.    PT Comments    Pt demonstrating gradual progress.  Still requiring assist for transfers, high fall risk, and fatigues easily.  Required cues for sequencing and safe transfer technique.  Pt now reporting that great grandson is not coming to stay with her and she will not have 24 hr supervision.  Spent increased time discussing safety concerns with return home, but pt continues to express extreme fear of COVID at SNF (had friend with vaccine die from [place] she caught at [facility]).  Discussed still recommending SNF, but if going home needs 24 hr supervision.     Follow Up Recommendations  SNF;Supervision/Assistance - 24 hour (pt likely still refusing SNF)     Equipment Recommendations  3in1 (PT) (bariatric BSC, EMS transport, hospital bed)    Recommendations for Other Services       Precautions / Restrictions Precautions Precautions: Fall    Mobility  Bed Mobility Overal bed mobility: Needs Assistance Bed Mobility: Sit to Supine       Sit to supine: Min assist   General bed mobility comments: Required min A to get bil legs into bed.  Pt was able to pull self up in bed with air mattress in trendelenberg (but could not have done on regular mattress and flat)  Transfers Overall transfer level: Needs assistance Equipment used: Rolling walker (2  wheeled) Transfers: Sit to/from [facility] Sit to Stand: Min assist;+2 safety/equipment Stand pivot transfers: Min assist;+2 safety/equipment       General transfer comment: Pt performed sit to stand from [facility] and from recliner.  From [facility] required min A to stop forward momentum with standing and from chair required min A to boost.  Pt required increased time for transfers with cues for hand placement and getting feet under body.  For toielting ADLs tried to have pt do on her own since she wants to go home alone. She was not able to do in sitting with weight shift.  In standing required min guard for safety and was not able to complete ADLs - required assist.  Ambulation/Gait Ambulation/Gait assistance: Min assist;+2 safety/equipment Gait Distance (Feet): 3 Feet (3'x2) Assistive device: Rolling walker (2 wheeled) Gait Pattern/deviations: Shuffle;Trunk flexed Gait velocity: reduced   General Gait Details: Steps from bsc to chair and then to bed.  Pt becoming very anxious , stating "I'm going to fall."  Required cues for relaxation, sequencing, and to step all the way back to seating surface.   Stairs             Wheelchair Mobility    Modified Rankin (Stroke Patients Only)       Balance Overall balance assessment: Needs assistance Sitting-balance support: Feet supported;No upper extremity supported Sitting balance-Leahy Scale: Good     Standing balance support: Bilateral upper extremity supported Standing balance-Leahy Scale: Poor Standing balance comment: reliant on RW and external support  Cognition Arousal/Alertness: Awake/alert Behavior During Therapy: Anxious (anxious during transfers) Overall Cognitive Status: Within Functional Limits for tasks assessed                                        Exercises      General Comments General comments (skin integrity, edema, etc.):  Pt was on 2 L O2  with sats 98%.  Placed on RA and sats maintained 94% with rest but dropped to 88% with transfers.   Replaced 2 L O2 for second transfer.    Pt has reported that great grandson will not be able to stay with her.  States she has aide 2 hr day 5 days a week.  She listed several family members that "can assist/check in some," but she was not able to say that she would for sure have near 24 hr supervision.  Discussed safety concerns at home in regards to not being able to transfer or do ADLs alone, fall risk, and how she would get out in emergency if alone (has 2 steps).  Pt understands but reports she is afraid of COVID at facility.  States she has had her vaccine but that she had a friend that was vaccinated , went to SNF, caught COVID and died.  Reports she will consider options - discussed she has d/c order today.  Still STRONGLY recommend SNF but if home needs 24 hr supervision.      Pertinent Vitals/Pain Pain Assessment: Faces Faces Pain Scale: Hurts little more Pain Location: R lower leg Pain Descriptors / Indicators: Grimacing Pain Intervention(s): Limited activity within patient's tolerance;Monitored during session;Relaxation    Home Living                      Prior Function            PT Goals (current goals can now be found in the care plan section) Acute Rehab PT Goals Patient Stated Goal: home PT Goal Formulation: With patient Time For Goal Achievement: 08/02/20 Potential to Achieve Goals: Fair Progress towards PT goals: Progressing toward goals    Frequency    Min 3X/week      PT Plan Current plan remains appropriate    Co-evaluation              AM-PAC PT "6 Clicks" Mobility   Outcome Measure  Help needed turning from your back to your side while in a flat bed without using bedrails?: A Lot Help needed moving from lying on your back to sitting on the side of a flat bed without using bedrails?: A Lot Help needed moving to and from a bed to a chair  (including a wheelchair)?: A Lot Help needed standing up from a chair using your arms (e.g., wheelchair or bedside chair)?: A Little Help needed to walk in hospital room?: A Lot Help needed climbing 3-5 steps with a railing? : Total 6 Click Score: 12    End of Session Equipment Utilized During Treatment: Gait belt;Oxygen Activity Tolerance: Patient limited by fatigue Patient left: with call bell/phone within reach;in bed Nurse Communication: Mobility status PT Visit Diagnosis: Unsteadiness on feet (R26.81);Other abnormalities of gait and mobility (R26.89);Muscle weakness (generalized) (M62.81);Other (comment)     Time: 1028-1100 PT Time Calculation (min) (ACUTE ONLY): 32 min  Charges:  $Gait Training: 8-22 mins $Therapeutic Activity: 8-22 mins  Abran Richard, PT Acute Rehab Services Pager [phone] [facility] [place] 07/25/2020, 11:30 AM

## 2020-07-25 NOTE — Discharge Summary (Signed)
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         5.00 cm  Diastology LVIDs:         3.40 cm  LV e' medial:    6.96 cm/s LV PW:         1.40 cm  LV E/e' medial:  7.8 LV IVS:        1.40 cm  LV e' lateral:   6.42 cm/s LVOT diam:     2.10 cm  LV E/e' lateral: 8.4 LVOT Area:     3.46 cm  RIGHT VENTRICLE RV S prime:     10.20 cm/s TAPSE (M-mode): 2.4 cm LEFT ATRIUM             Index       RIGHT ATRIUM           Index LA diam:        4.50 cm 1.82 cm/m  RA Area:     22.00 cm LA Vol (A2C):   52.5 ml 21.19 ml/m RA Volume:   62.30 ml  25.15 ml/m LA Vol (A4C):   80.2 ml 32.37 ml/m LA Biplane Vol: 69.6 ml 28.10 ml/m   AORTA Ao Root diam: 3.30 cm MITRAL VALVE               TRICUSPID VALVE MV Area (PHT): 2.95 cm    TR Peak grad:   44.4 mmHg MV Decel Time: 257 msec    TR Vmax:        333.00 cm/s MV E velocity: 54.00 cm/s MV A  velocity: 67.70 cm/s  SHUNTS MV E/A ratio:  0.80        Systemic Diam: 2.10 cm Dixie Dials MD Electronically signed by Dixie Dials MD Signature Date/Time: 07/16/2020/5:48:46 PM    Final      Labs:   Basic Metabolic Panel: Recent Labs  Lab 07/19/20 0115 07/19/20 0115 07/21/20 0512 07/22/20 0124  NA 139  --  138 138  K 4.5   < > 4.7 4.4  CL 106  --  105 104  CO2 23  --  25 24  GLUCOSE 110*  --  92 125*  BUN 13  --  15 14  CREATININE 1.35*  --  1.46* 1.37*  CALCIUM 8.4*  --  8.5* 8.6*  MG  --   --   --  1.8   < > = values in this interval not displayed.   GFR Estimated Creatinine Clearance: 56.7 mL/min (A) (by C-G formula based on SCr of 1.37 mg/dL (H)). Liver Function Tests: No results for  input(s): AST, ALT, ALKPHOS, BILITOT, PROT, ALBUMIN in the last 168 hours. No results for input(s): LIPASE, AMYLASE in the last 168 hours. No results for input(s): AMMONIA in the last 168 hours. Coagulation profile No results for input(s): INR, PROTIME in the last 168 hours.  CBC: Recent Labs  Lab 07/19/20 0115 07/21/20 0512 07/22/20 0124  WBC 6.7 7.1 6.2  NEUTROABS 4.7 4.1 3.7  HGB 10.6* 9.6* 10.5*  HCT 33.5* 30.9* 33.5*  MCV 100.9* 100.3* 100.0  PLT 125* 158 166   Cardiac Enzymes: No results for input(s): CKTOTAL, CKMB, CKMBINDEX, TROPONINI in the last 168 hours. BNP: Invalid input(s): POCBNP CBG: No results for input(s): GLUCAP in the last 168 hours. D-Dimer No results for input(s): DDIMER in the last 72 hours. Hgb A1c No results for input(s): HGBA1C in the last 72 hours. Lipid Profile No results for input(s): CHOL, HDL, LDLCALC, TRIG, CHOLHDL, LDLDIRECT in the last 72 hours. Thyroid function studies No results for input(s): TSH, T4TOTAL, T3FREE, THYROIDAB in the last 72 hours.  Invalid input(s): FREET3 Anemia work up No results for input(s): VITAMINB12, FOLATE, FERRITIN, TIBC, IRON, RETICCTPCT in the last 72 hours. Microbiology Recent Results (from the past 240  hour(s))  SARS Coronavirus 2 by RT PCR (hospital order, performed in [facility] hospital lab) Nasopharyngeal Nasopharyngeal Swab     Status: None   Collection Time: 07/15/20 11:02 PM   Specimen: Nasopharyngeal Swab  Result Value Ref Range Status   SARS Coronavirus 2 NEGATIVE NEGATIVE Final    Comment: (NOTE) SARS-CoV-2 target nucleic acids are NOT DETECTED.  The SARS-CoV-2 RNA is generally detectable in upper and lower respiratory specimens during the acute phase of infection. The lowest concentration of SARS-CoV-2 viral copies this assay can detect is 250 copies / mL. A negative result does not preclude SARS-CoV-2 infection and should not be used as the sole basis for treatment or other patient management decisions.  A negative result may occur with improper specimen collection / handling, submission of specimen other than nasopharyngeal swab, presence of viral mutation(s) within the areas targeted by this assay, and inadequate number of viral copies (<250 copies / mL). A negative result must be combined with clinical observations, patient history, and epidemiological information.  Fact Sheet for Patients:   StrictlyIdeas.no  Fact Sheet for Healthcare Providers: BankingDealers.co.za  This test is not yet approved or  cleared by the [place] FDA and has been authorized for detection and/or diagnosis of SARS-CoV-2 by FDA under an Emergency Use Authorization (EUA).  This EUA will remain in effect (meaning this test can be used) for the duration of the COVID-19 declaration under Section 564(b)(1) of the Act, 21 U.S.C. section 360bbb-3(b)(1), unless the authorization is terminated or revoked sooner.  Performed at [facility], [place] [address], [place], [place] [address]   Culture, blood (routine x 2)     Status: None   Collection Time: 07/16/20  8:51 AM   Specimen: BLOOD  Result Value Ref Range Status   Specimen  Description BLOOD RIGHT ANTECUBITAL  Final   Special Requests   Final    BOTTLES DRAWN AEROBIC AND ANAEROBIC Blood Culture adequate volume   Culture   Final    NO GROWTH 5 DAYS Performed at [facility], [place] [address], [place], [place] [address]    Report Status 07/21/2020 FINAL  Final  Culture, blood (routine x 2)     Status: None   Collection Time: 07/16/20  8:51 AM   Specimen:  BLOOD  Result Value Ref Range Status   Specimen Description BLOOD LEFT ANTECUBITAL  Final   Special Requests   Final    BOTTLES DRAWN AEROBIC AND ANAEROBIC Blood Culture adequate volume   Culture   Final    NO GROWTH 5 DAYS Performed at [facility], [address] [place], [place] [address]    Report Status 07/21/2020 FINAL  Final  Culture, Urine     Status: Abnormal   Collection Time: 07/16/20  9:48 AM   Specimen: Urine, Random  Result Value Ref Range Status   Specimen Description URINE, RANDOM  Final   Special Requests   Final    NONE Performed at [facility], [place] [address] [place], [place] [address]    Culture >=100,000 COLONIES/mL ESCHERICHIA COLI (A)  Final   Report Status 07/18/2020 FINAL  Final   Organism ID, Bacteria ESCHERICHIA COLI (A)  Final      Susceptibility   Escherichia coli - MIC*    AMPICILLIN <=2 SENSITIVE Sensitive     CEFAZOLIN <=4 SENSITIVE Sensitive     CEFTRIAXONE <=0.25 SENSITIVE Sensitive     CIPROFLOXACIN <=0.25 SENSITIVE Sensitive     GENTAMICIN <=1 SENSITIVE Sensitive     IMIPENEM <=0.25 SENSITIVE Sensitive     NITROFURANTOIN 64 INTERMEDIATE Intermediate     TRIMETH/SULFA <=20 SENSITIVE Sensitive     AMPICILLIN/SULBACTAM <=2 SENSITIVE Sensitive     PIP/TAZO <=4 SENSITIVE Sensitive     * >=100,000 COLONIES/mL ESCHERICHIA COLI  Respiratory Panel by PCR     Status: None   Collection Time: 07/16/20  2:21 PM   Specimen: Nasopharyngeal Swab; Respiratory  Result Value Ref Range Status   Adenovirus NOT DETECTED NOT DETECTED Final   Coronavirus  229E NOT DETECTED NOT DETECTED Final    Comment: (NOTE) The Coronavirus on the Respiratory Panel, DOES NOT test for the novel  Coronavirus (2019 nCoV)    Coronavirus HKU1 NOT DETECTED NOT DETECTED Final   Coronavirus NL63 NOT DETECTED NOT DETECTED Final   Coronavirus OC43 NOT DETECTED NOT DETECTED Final   Metapneumovirus NOT DETECTED NOT DETECTED Final   Rhinovirus / Enterovirus NOT DETECTED NOT DETECTED Final   Influenza A NOT DETECTED NOT DETECTED Final   Influenza B NOT DETECTED NOT DETECTED Final   Parainfluenza Virus 1 NOT DETECTED NOT DETECTED Final   Parainfluenza Virus 2 NOT DETECTED NOT DETECTED Final   Parainfluenza Virus 3 NOT DETECTED NOT DETECTED Final   Parainfluenza Virus 4 NOT DETECTED NOT DETECTED Final   Respiratory Syncytial Virus NOT DETECTED NOT DETECTED Final   Bordetella pertussis NOT DETECTED NOT DETECTED Final   Chlamydophila pneumoniae NOT DETECTED NOT DETECTED Final   Mycoplasma pneumoniae NOT DETECTED NOT DETECTED Final    Comment: Performed at [facility], [place]. [address] [place], [place] [address]  MRSA PCR Screening     Status: None   Collection Time: 07/16/20 10:43 PM   Specimen: Nasal Mucosa; Nasopharyngeal  Result Value Ref Range Status   MRSA by PCR NEGATIVE NEGATIVE Final    Comment:        The GeneXpert MRSA Assay (FDA approved for NASAL specimens only), is one component of a comprehensive MRSA colonization surveillance program. It is not intended to diagnose MRSA infection nor to guide or monitor treatment for MRSA infections. Performed at [facility], [place] [address], [place], [place] [address]   Respiratory Panel by RT PCR (Flu A&B, Covid) - Nasopharyngeal Swab  Status: None   Collection Time: 07/17/20 11:19 AM   Specimen: Nasopharyngeal Swab  Result Value Ref Range Status   SARS Coronavirus 2 by RT PCR NEGATIVE NEGATIVE Final    Comment: (NOTE) SARS-CoV-2 target nucleic acids are NOT DETECTED.  The SARS-CoV-2 RNA  is generally detectable in upper respiratoy specimens during the acute phase of infection. The lowest concentration of SARS-CoV-2 viral copies this assay can detect is 131 copies/mL. A negative result does not preclude SARS-Cov-2 infection and should not be used as the sole basis for treatment or other patient management decisions. A negative result may occur with  improper specimen collection/handling, submission of specimen other than nasopharyngeal swab, presence of viral mutation(s) within the areas targeted by this assay, and inadequate number of viral copies (<131 copies/mL). A negative result must be combined with clinical observations, patient history, and epidemiological information. The expected result is Negative.  Fact Sheet for Patients:  PinkCheek.be  Fact Sheet for Healthcare Providers:  GravelBags.it  This test is no t yet approved or cleared by the [place] FDA and  has been authorized for detection and/or diagnosis of SARS-CoV-2 by FDA under an Emergency Use Authorization (EUA). This EUA will remain  in effect (meaning this test can be used) for the duration of the COVID-19 declaration under Section 564(b)(1) of the Act, 21 U.S.C. section 360bbb-3(b)(1), unless the authorization is terminated or revoked sooner.     Influenza A by PCR NEGATIVE NEGATIVE Final   Influenza B by PCR NEGATIVE NEGATIVE Final    Comment: (NOTE) The Xpert Xpress SARS-CoV-2/FLU/RSV assay is intended as an aid in  the diagnosis of influenza from Nasopharyngeal swab specimens and  should not be used as a sole basis for treatment. Nasal washings and  aspirates are unacceptable for Xpert Xpress SARS-CoV-2/FLU/RSV  testing.  Fact Sheet for Patients: PinkCheek.be  Fact Sheet for Healthcare Providers: GravelBags.it  This test is not yet approved or cleared by the [place] FDA and  has been authorized for detection and/or diagnosis of SARS-CoV-2 by  FDA under an Emergency Use Authorization (EUA). This EUA will remain  in effect (meaning this test can be used) for the duration of the  Covid-19 declaration under Section 564(b)(1) of the Act, 21  U.S.C. section 360bbb-3(b)(1), unless the authorization is  terminated or revoked. Performed at [facility], [place] [address] [place], [place] [address]      Signed: Terrilee Croak  [facility] 07/25/2020, 9:41 AM

## 2020-07-25 NOTE — TOC Transition Note (Addendum)
Transition of Care [facility] - CM/SW Discharge Note   Patient Details  Name: [name] MRN: [id number] Date of Birth: 1940-11-06  [facility] CM/SW Contact:  Zenon Mayo, RN Phone Number: 07/25/2020, 10:13 AM   Clinical Narrative:    Patient is for dc today, NCM spoke with Billee Cashing the grandson, he will be working til 4 pm and states he would like for transport to be set up by 3:30 for patient.  Also Shelia with [employer] will call him to coordinate the delivery of the DME.  Would like DME to be there before patient.   10/1-  Billee Cashing states his aunt will be at th house to received the hospital bed, he will call me  To let me know when to call ptar.  10/1- NCM called ptar at 2:50 pm for transport, she has several patient's in front of her so will be here in next couple of hours. NCM notified Luellen Pucker with [facility] that patient is for dc today.  Final next level of care: [place] Barriers to Discharge: Equipment Delay   Patient Goals and CMS Choice Patient states their goals for this hospitalization and ongoing recovery are:: get better CMS Medicare.gov Compare Post Acute Care list provided to:: Patient Choice offered to / list presented to : Patient  Discharge Placement                       Discharge Plan and Services                DME Arranged: Hospital bed, Bedside commode DME Agency: [employer] Date DME Agency Contacted: 08/23/20 Time DME Agency Contacted: [phone] Representative spoke with at DME Agency: Adela Lank HH Arranged: RN, PT, OT [place] Agency: [place] Date [place]: 08/23/20 Time HH Agency Contacted: 1600 Representative spoke with at [place]: [place] ([place]) Interventions     Readmission Risk Interventions Readmission Risk Prevention Plan 05/28/2020  Transportation Screening Complete  PCP or Specialist Appt within 3-5 Days Complete  HRI or [place] Complete  Social Work Consult for [place] Planning/Counseling [place] Not Applicable  Medication Review Press [employer]) Complete  Some recent data might be hidden

## 2020-07-25 NOTE — Progress Notes (Addendum)
RN provided detailed verbal discharge instructions to patient at bedside.  RN answered all questions. IV removed. Pt belongings packed in pt belongings bags. Hospital bed & [facility] delivered to home per grandson [place].  Attempted to call grandson on x 2 to give instruction for discharge with no answer.  Awaiting PTAR for pickup.   @1940  Evlyn Courier called to confirm medication he just picked up from pharmacy. RN advise grandson Billee Cashing of discharge instructions and medication list. RN advise of highlighted instructions on d/c papers which will be sent with patient.

## 2020-09-18 ENCOUNTER — Emergency Department (HOSPITAL_COMMUNITY): Payer: Medicare Other

## 2020-09-18 ENCOUNTER — Emergency Department (HOSPITAL_COMMUNITY)
Admission: EM | Admit: 2020-09-18 | Discharge: 2020-09-18 | Disposition: A | Discharge status: Home / Self Care | Payer: Medicare Other | Source: Home / Self Care | Attending: Emergency Medicine | Admitting: Emergency Medicine

## 2020-09-18 ENCOUNTER — Other Ambulatory Visit: Payer: Self-pay

## 2020-09-18 DIAGNOSIS — R7881 Bacteremia: Secondary | ICD-10-CM | POA: Diagnosis not present

## 2020-09-18 DIAGNOSIS — I2510 Atherosclerotic heart disease of native coronary artery without angina pectoris: Secondary | ICD-10-CM | POA: Insufficient documentation

## 2020-09-18 DIAGNOSIS — Z20822 Contact with and (suspected) exposure to covid-19: Secondary | ICD-10-CM | POA: Insufficient documentation

## 2020-09-18 DIAGNOSIS — Z7901 Long term (current) use of anticoagulants: Secondary | ICD-10-CM | POA: Insufficient documentation

## 2020-09-18 DIAGNOSIS — Z950 Presence of cardiac pacemaker: Secondary | ICD-10-CM | POA: Insufficient documentation

## 2020-09-18 DIAGNOSIS — Z7982 Long term (current) use of aspirin: Secondary | ICD-10-CM | POA: Insufficient documentation

## 2020-09-18 DIAGNOSIS — B349 Viral infection, unspecified: Secondary | ICD-10-CM | POA: Insufficient documentation

## 2020-09-18 DIAGNOSIS — A4151 Sepsis due to Escherichia coli [E. coli]: Secondary | ICD-10-CM | POA: Diagnosis not present

## 2020-09-18 DIAGNOSIS — Z79899 Other long term (current) drug therapy: Secondary | ICD-10-CM | POA: Insufficient documentation

## 2020-09-18 DIAGNOSIS — I5032 Chronic diastolic (congestive) heart failure: Secondary | ICD-10-CM | POA: Insufficient documentation

## 2020-09-18 DIAGNOSIS — I110 Hypertensive heart disease with heart failure: Secondary | ICD-10-CM | POA: Insufficient documentation

## 2020-09-18 DIAGNOSIS — Z955 Presence of coronary angioplasty implant and graft: Secondary | ICD-10-CM | POA: Insufficient documentation

## 2020-09-18 DIAGNOSIS — R609 Edema, unspecified: Secondary | ICD-10-CM | POA: Insufficient documentation

## 2020-09-18 DIAGNOSIS — Z8673 Personal history of transient ischemic attack (TIA), and cerebral infarction without residual deficits: Secondary | ICD-10-CM | POA: Insufficient documentation

## 2020-09-18 LAB — URINALYSIS, ROUTINE W REFLEX MICROSCOPIC
Bilirubin Urine: NEGATIVE
Glucose, UA: NEGATIVE mg/dL
Hgb urine dipstick: NEGATIVE
Ketones, ur: NEGATIVE mg/dL
Leukocytes,Ua: NEGATIVE
Nitrite: NEGATIVE
Protein, ur: NEGATIVE mg/dL
Specific Gravity, Urine: 1.006 (ref 1.005–1.030)
pH: 7 (ref 5.0–8.0)

## 2020-09-18 LAB — BRAIN NATRIURETIC PEPTIDE: B Natriuretic Peptide: 107.8 pg/mL — ABNORMAL HIGH (ref 0.0–100.0)

## 2020-09-18 LAB — COMPREHENSIVE METABOLIC PANEL
ALT: 14 U/L (ref 0–44)
AST: 23 U/L (ref 15–41)
Albumin: 3.3 g/dL — ABNORMAL LOW (ref 3.5–5.0)
Alkaline Phosphatase: 69 U/L (ref 38–126)
Anion gap: 10 (ref 5–15)
BUN: 15 mg/dL (ref 8–23)
CO2: 25 mmol/L (ref 22–32)
Calcium: 8.8 mg/dL — ABNORMAL LOW (ref 8.9–10.3)
Chloride: 106 mmol/L (ref 98–111)
Creatinine, Ser: 1.59 mg/dL — ABNORMAL HIGH (ref 0.44–1.00)
GFR, Estimated: 33 mL/min — ABNORMAL LOW (ref >60)
Glucose, Bld: 114 mg/dL — ABNORMAL HIGH (ref 70–99)
Potassium: 4.2 mmol/L (ref 3.5–5.1)
Sodium: 141 mmol/L (ref 135–145)
Total Bilirubin: 0.7 mg/dL (ref 0.3–1.2)
Total Protein: 7.6 g/dL (ref 6.5–8.1)

## 2020-09-18 LAB — TROPONIN I (HIGH SENSITIVITY)
Troponin I (High Sensitivity): 23 ng/L — ABNORMAL HIGH (ref <18)
Troponin I (High Sensitivity): 24 ng/L — ABNORMAL HIGH (ref <18)

## 2020-09-18 LAB — CBC WITH DIFFERENTIAL/PLATELET
Abs Immature Granulocytes: 0.03 10*3/uL (ref 0.00–0.07)
Basophils Absolute: 0 10*3/uL (ref 0.0–0.1)
Basophils Relative: 1 %
Eosinophils Absolute: 0 10*3/uL (ref 0.0–0.5)
Eosinophils Relative: 0 %
HCT: 37.9 % (ref 36.0–46.0)
Hemoglobin: 11.8 g/dL — ABNORMAL LOW (ref 12.0–15.0)
Immature Granulocytes: 1 %
Lymphocytes Relative: 12 %
Lymphs Abs: 0.6 10*3/uL — ABNORMAL LOW (ref 0.7–4.0)
MCH: 31.3 pg (ref 26.0–34.0)
MCHC: 31.1 g/dL (ref 30.0–36.0)
MCV: 100.5 fL — ABNORMAL HIGH (ref 80.0–100.0)
Monocytes Absolute: 0.5 10*3/uL (ref 0.1–1.0)
Monocytes Relative: 9 %
Neutro Abs: 4.2 10*3/uL (ref 1.7–7.7)
Neutrophils Relative %: 77 %
Platelets: 165 10*3/uL (ref 150–400)
RBC: 3.77 MIL/uL — ABNORMAL LOW (ref 3.87–5.11)
RDW: 16.4 % — ABNORMAL HIGH (ref 11.5–15.5)
WBC: 5.4 10*3/uL (ref 4.0–10.5)
nRBC: 0 % (ref 0.0–0.2)

## 2020-09-18 LAB — LACTIC ACID, PLASMA: Lactic Acid, Venous: 1.7 mmol/L (ref 0.5–1.9)

## 2020-09-18 LAB — RESP PANEL BY RT-PCR (RSV, FLU A&B, COVID)  RVPGX2
Influenza A by PCR: NEGATIVE
Influenza B by PCR: NEGATIVE
Resp Syncytial Virus by PCR: NEGATIVE
SARS Coronavirus 2 by RT PCR: NEGATIVE

## 2020-09-18 LAB — PROTIME-INR
INR: 1.3 — ABNORMAL HIGH (ref 0.8–1.2)
Prothrombin Time: 15.6 seconds — ABNORMAL HIGH (ref 11.4–15.2)

## 2020-09-18 LAB — APTT: aPTT: 32 seconds (ref 24–36)

## 2020-09-18 MED ORDER — ACETAMINOPHEN 325 MG PO TABS
650.0000 mg | ORAL_TABLET | Freq: Once | ORAL | Status: AC
  Administered 2020-09-18: 650 mg via ORAL
  Filled 2020-09-18: qty 2

## 2020-09-18 MED ORDER — SODIUM CHLORIDE 0.9 % IV SOLN
1.0000 g | INTRAVENOUS | Status: DC
  Administered 2020-09-18: 1 g via INTRAVENOUS
  Filled 2020-09-18: qty 10

## 2020-09-18 MED ORDER — LACTATED RINGERS IV BOLUS
1000.0000 mL | Freq: Once | INTRAVENOUS | Status: AC
  Administered 2020-09-18: 1000 mL via INTRAVENOUS

## 2020-09-18 MED ORDER — SODIUM CHLORIDE 0.9 % IV SOLN
500.0000 mg | INTRAVENOUS | Status: DC
  Administered 2020-09-18: 500 mg via INTRAVENOUS
  Filled 2020-09-18: qty 500

## 2020-09-18 MED ORDER — LACTATED RINGERS IV BOLUS: 1000.0000 mL | Freq: Once | INTRAVENOUS | Status: DC

## 2020-09-18 NOTE — ED Notes (Signed)
Pt given a half a cup of water for PO challenge. Will reassess in 30 minutes
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
## 2020-09-18 NOTE — ED Triage Notes (Signed)
To ED via [facility] from home with c/o chest pain-- denies fever, or covid exposure, took ASA 324mg , NTG x 2, does hurt to breath-  Hx pneumonia,  Temp on arrival 102.9

## 2020-09-18 NOTE — Discharge Instructions (Signed)
-  Continue to take tylenol as needed for body aches and fever. Take as directed on the bottle  Follow up with primary care doctor for symptom recheck early next week.  Case management referral placed and they should reach out to you to discuss needs.  Referral also sent to wound care. They should contact you to set up an appointment. If you do not hear from them  you can call the office number listed.  Return to the emergency department for any new or worsening symptoms

## 2020-09-18 NOTE — ED Notes (Addendum)
PA Kaitlyn made aware of pts temperature. PA advised pt to take tylenol at home in roughly an hour.

## 2020-09-18 NOTE — ED Notes (Signed)
Pt transported back from [facility]

## 2020-09-18 NOTE — ED Notes (Signed)
Pt given water for fluid challenge. Informed pt if she becomes nauseated stop drinking and inform staff

## 2020-09-18 NOTE — ED Notes (Signed)
Pt transported to X[facility] °

## 2020-09-19 ENCOUNTER — Telehealth (HOSPITAL_BASED_OUTPATIENT_CLINIC_OR_DEPARTMENT_OTHER): Payer: Self-pay | Admitting: Emergency Medicine

## 2020-09-19 ENCOUNTER — Emergency Department (HOSPITAL_COMMUNITY): Payer: Medicare Other

## 2020-09-19 ENCOUNTER — Encounter (HOSPITAL_COMMUNITY): Payer: Self-pay | Admitting: Emergency Medicine

## 2020-09-19 ENCOUNTER — Inpatient Hospital Stay (HOSPITAL_COMMUNITY)
Admission: EM | Admit: 2020-09-19 | Discharge: 2020-09-24 | DRG: 871 | Disposition: A | Discharge status: Home Health | Payer: Medicare Other | Attending: Internal Medicine | Admitting: Internal Medicine

## 2020-09-19 ENCOUNTER — Other Ambulatory Visit: Payer: Self-pay

## 2020-09-19 DIAGNOSIS — N1832 Chronic kidney disease, stage 3b: Secondary | ICD-10-CM | POA: Diagnosis not present

## 2020-09-19 DIAGNOSIS — R7881 Bacteremia: Secondary | ICD-10-CM | POA: Diagnosis present

## 2020-09-19 DIAGNOSIS — L89152 Pressure ulcer of sacral region, stage 2: Secondary | ICD-10-CM | POA: Diagnosis present

## 2020-09-19 DIAGNOSIS — B9620 Unspecified Escherichia coli [E. coli] as the cause of diseases classified elsewhere: Secondary | ICD-10-CM | POA: Diagnosis present

## 2020-09-19 DIAGNOSIS — E785 Hyperlipidemia, unspecified: Secondary | ICD-10-CM | POA: Diagnosis present

## 2020-09-19 DIAGNOSIS — I130 Hypertensive heart and chronic kidney disease with heart failure and stage 1 through stage 4 chronic kidney disease, or unspecified chronic kidney disease: Secondary | ICD-10-CM | POA: Diagnosis present

## 2020-09-19 DIAGNOSIS — L8990 Pressure ulcer of unspecified site, unspecified stage: Secondary | ICD-10-CM | POA: Diagnosis present

## 2020-09-19 DIAGNOSIS — A4150 Gram-negative sepsis, unspecified: Secondary | ICD-10-CM | POA: Insufficient documentation

## 2020-09-19 DIAGNOSIS — Z7901 Long term (current) use of anticoagulants: Secondary | ICD-10-CM

## 2020-09-19 DIAGNOSIS — N179 Acute kidney failure, unspecified: Secondary | ICD-10-CM | POA: Diagnosis present

## 2020-09-19 DIAGNOSIS — N3946 Mixed incontinence: Secondary | ICD-10-CM | POA: Diagnosis present

## 2020-09-19 DIAGNOSIS — I4892 Unspecified atrial flutter: Secondary | ICD-10-CM | POA: Diagnosis not present

## 2020-09-19 DIAGNOSIS — I480 Paroxysmal atrial fibrillation: Secondary | ICD-10-CM | POA: Diagnosis present

## 2020-09-19 DIAGNOSIS — G4730 Sleep apnea, unspecified: Secondary | ICD-10-CM | POA: Diagnosis present

## 2020-09-19 DIAGNOSIS — K219 Gastro-esophageal reflux disease without esophagitis: Secondary | ICD-10-CM | POA: Diagnosis present

## 2020-09-19 DIAGNOSIS — Z880 Allergy status to penicillin: Secondary | ICD-10-CM

## 2020-09-19 DIAGNOSIS — I739 Peripheral vascular disease, unspecified: Secondary | ICD-10-CM | POA: Diagnosis present

## 2020-09-19 DIAGNOSIS — Z888 Allergy status to other drugs, medicaments and biological substances status: Secondary | ICD-10-CM

## 2020-09-19 DIAGNOSIS — Z8673 Personal history of transient ischemic attack (TIA), and cerebral infarction without residual deficits: Secondary | ICD-10-CM

## 2020-09-19 DIAGNOSIS — K5900 Constipation, unspecified: Secondary | ICD-10-CM | POA: Diagnosis present

## 2020-09-19 DIAGNOSIS — D631 Anemia in chronic kidney disease: Secondary | ICD-10-CM | POA: Diagnosis present

## 2020-09-19 DIAGNOSIS — A4151 Sepsis due to Escherichia coli [E. coli]: Secondary | ICD-10-CM | POA: Diagnosis present

## 2020-09-19 DIAGNOSIS — Z7982 Long term (current) use of aspirin: Secondary | ICD-10-CM

## 2020-09-19 DIAGNOSIS — I872 Venous insufficiency (chronic) (peripheral): Secondary | ICD-10-CM | POA: Diagnosis present

## 2020-09-19 DIAGNOSIS — I2510 Atherosclerotic heart disease of native coronary artery without angina pectoris: Secondary | ICD-10-CM | POA: Diagnosis present

## 2020-09-19 DIAGNOSIS — Z79899 Other long term (current) drug therapy: Secondary | ICD-10-CM

## 2020-09-19 DIAGNOSIS — I5033 Acute on chronic diastolic (congestive) heart failure: Secondary | ICD-10-CM | POA: Diagnosis not present

## 2020-09-19 DIAGNOSIS — I495 Sick sinus syndrome: Secondary | ICD-10-CM | POA: Diagnosis present

## 2020-09-19 DIAGNOSIS — I252 Old myocardial infarction: Secondary | ICD-10-CM

## 2020-09-19 DIAGNOSIS — Z20822 Contact with and (suspected) exposure to covid-19: Secondary | ICD-10-CM | POA: Diagnosis present

## 2020-09-19 DIAGNOSIS — Z90710 Acquired absence of both cervix and uterus: Secondary | ICD-10-CM

## 2020-09-19 DIAGNOSIS — A419 Sepsis, unspecified organism: Secondary | ICD-10-CM | POA: Diagnosis not present

## 2020-09-19 DIAGNOSIS — Z7902 Long term (current) use of antithrombotics/antiplatelets: Secondary | ICD-10-CM

## 2020-09-19 DIAGNOSIS — Z8701 Personal history of pneumonia (recurrent): Secondary | ICD-10-CM

## 2020-09-19 DIAGNOSIS — Z950 Presence of cardiac pacemaker: Secondary | ICD-10-CM

## 2020-09-19 DIAGNOSIS — F32A Depression, unspecified: Secondary | ICD-10-CM | POA: Diagnosis present

## 2020-09-19 DIAGNOSIS — Z79891 Long term (current) use of opiate analgesic: Secondary | ICD-10-CM

## 2020-09-19 DIAGNOSIS — Z6841 Body Mass Index (BMI) 40.0 and over, adult: Secondary | ICD-10-CM | POA: Diagnosis not present

## 2020-09-19 LAB — COMPREHENSIVE METABOLIC PANEL
ALT: 13 U/L (ref 0–44)
AST: 27 U/L (ref 15–41)
Albumin: 3.1 g/dL — ABNORMAL LOW (ref 3.5–5.0)
Alkaline Phosphatase: 59 U/L (ref 38–126)
Anion gap: 11 (ref 5–15)
BUN: 14 mg/dL (ref 8–23)
CO2: 23 mmol/L (ref 22–32)
Calcium: 8.7 mg/dL — ABNORMAL LOW (ref 8.9–10.3)
Chloride: 106 mmol/L (ref 98–111)
Creatinine, Ser: 1.67 mg/dL — ABNORMAL HIGH (ref 0.44–1.00)
GFR, Estimated: 32 mL/min — ABNORMAL LOW (ref >60)
Glucose, Bld: 101 mg/dL — ABNORMAL HIGH (ref 70–99)
Potassium: 4.1 mmol/L (ref 3.5–5.1)
Sodium: 140 mmol/L (ref 135–145)
Total Bilirubin: 0.7 mg/dL (ref 0.3–1.2)
Total Protein: 7.3 g/dL (ref 6.5–8.1)

## 2020-09-19 LAB — BLOOD CULTURE ID PANEL (REFLEXED) - BCID2

## 2020-09-19 LAB — URINALYSIS, ROUTINE W REFLEX MICROSCOPIC
Bacteria, UA: NONE SEEN
Bilirubin Urine: NEGATIVE
Glucose, UA: NEGATIVE mg/dL
Ketones, ur: NEGATIVE mg/dL
Nitrite: NEGATIVE
Protein, ur: NEGATIVE mg/dL
Specific Gravity, Urine: 1.005 (ref 1.005–1.030)
pH: 7 (ref 5.0–8.0)

## 2020-09-19 LAB — CBC WITH DIFFERENTIAL/PLATELET
Abs Immature Granulocytes: 0.03 10*3/uL (ref 0.00–0.07)
Basophils Absolute: 0 10*3/uL (ref 0.0–0.1)
Basophils Relative: 1 %
Eosinophils Absolute: 0 10*3/uL (ref 0.0–0.5)
Eosinophils Relative: 0 %
HCT: 36.4 % (ref 36.0–46.0)
Hemoglobin: 11.4 g/dL — ABNORMAL LOW (ref 12.0–15.0)
Immature Granulocytes: 1 %
Lymphocytes Relative: 14 %
Lymphs Abs: 0.7 10*3/uL (ref 0.7–4.0)
MCH: 30.9 pg (ref 26.0–34.0)
MCHC: 31.3 g/dL (ref 30.0–36.0)
MCV: 98.6 fL (ref 80.0–100.0)
Monocytes Absolute: 0.5 10*3/uL (ref 0.1–1.0)
Monocytes Relative: 10 %
Neutro Abs: 3.5 10*3/uL (ref 1.7–7.7)
Neutrophils Relative %: 74 %
Platelets: 137 10*3/uL — ABNORMAL LOW (ref 150–400)
RBC: 3.69 MIL/uL — ABNORMAL LOW (ref 3.87–5.11)
RDW: 16.5 % — ABNORMAL HIGH (ref 11.5–15.5)
WBC: 4.7 10*3/uL (ref 4.0–10.5)
nRBC: 0 % (ref 0.0–0.2)

## 2020-09-19 LAB — LACTIC ACID, PLASMA
Lactic Acid, Venous: 1.2 mmol/L (ref 0.5–1.9)
Lactic Acid, Venous: 1.8 mmol/L (ref 0.5–1.9)

## 2020-09-19 LAB — APTT: aPTT: 29 seconds (ref 24–36)

## 2020-09-19 LAB — PROTIME-INR
INR: 1.5 — ABNORMAL HIGH (ref 0.8–1.2)
Prothrombin Time: 17.7 seconds — ABNORMAL HIGH (ref 11.4–15.2)

## 2020-09-19 MED ORDER — ONDANSETRON HCL 4 MG/2ML IJ SOLN
4.0000 mg | Freq: Four times a day (QID) | INTRAMUSCULAR | Status: DC | PRN
  Administered 2020-09-20: 4 mg via INTRAVENOUS
  Filled 2020-09-19: qty 2

## 2020-09-19 MED ORDER — LACTATED RINGERS IV BOLUS (SEPSIS): 1000.0000 mL | Freq: Once | INTRAVENOUS | Status: DC

## 2020-09-19 MED ORDER — ROSUVASTATIN CALCIUM 5 MG PO TABS
10.0000 mg | ORAL_TABLET | Freq: Every day | ORAL | Status: DC
  Administered 2020-09-19 – 2020-09-24 (×6): 10 mg via ORAL
  Filled 2020-09-19 (×6): qty 2

## 2020-09-19 MED ORDER — ACETAMINOPHEN 325 MG PO TABS
650.0000 mg | ORAL_TABLET | Freq: Four times a day (QID) | ORAL | Status: DC | PRN
  Administered 2020-09-19 – 2020-09-22 (×8): 650 mg via ORAL
  Filled 2020-09-19 (×8): qty 2

## 2020-09-19 MED ORDER — LACTATED RINGERS IV BOLUS (SEPSIS)
1000.0000 mL | Freq: Once | INTRAVENOUS | Status: AC
  Administered 2020-09-19: 1000 mL via INTRAVENOUS

## 2020-09-19 MED ORDER — ACETAMINOPHEN 500 MG PO TABS
1000.0000 mg | ORAL_TABLET | Freq: Once | ORAL | Status: AC
  Administered 2020-09-19: 1000 mg via ORAL
  Filled 2020-09-19: qty 2

## 2020-09-19 MED ORDER — ACETAMINOPHEN 650 MG RE SUPP: 650.0000 mg | Freq: Four times a day (QID) | RECTAL | Status: DC | PRN

## 2020-09-19 MED ORDER — GUAIFENESIN ER 600 MG PO TB12
600.0000 mg | ORAL_TABLET | Freq: Two times a day (BID) | ORAL | Status: DC
  Administered 2020-09-19 – 2020-09-24 (×11): 600 mg via ORAL
  Filled 2020-09-19 (×11): qty 1

## 2020-09-19 MED ORDER — PANTOPRAZOLE SODIUM 40 MG PO TBEC
40.0000 mg | DELAYED_RELEASE_TABLET | Freq: Two times a day (BID) | ORAL | Status: DC
  Administered 2020-09-19 – 2020-09-24 (×11): 40 mg via ORAL
  Filled 2020-09-19 (×11): qty 1

## 2020-09-19 MED ORDER — SODIUM CHLORIDE 0.9 % IV SOLN
2.0000 g | Freq: Once | INTRAVENOUS | Status: AC
  Administered 2020-09-19: 2 g via INTRAVENOUS
  Filled 2020-09-19: qty 20

## 2020-09-19 MED ORDER — LACTATED RINGERS IV SOLN: INTRAVENOUS | Status: DC

## 2020-09-19 MED ORDER — SODIUM CHLORIDE 0.9% FLUSH
3.0000 mL | Freq: Two times a day (BID) | INTRAVENOUS | Status: DC
  Administered 2020-09-19 – 2020-09-23 (×8): 3 mL via INTRAVENOUS

## 2020-09-19 MED ORDER — SENNOSIDES-DOCUSATE SODIUM 8.6-50 MG PO TABS
2.0000 | ORAL_TABLET | Freq: Every day | ORAL | Status: DC
  Administered 2020-09-19 – 2020-09-24 (×5): 2 via ORAL
  Filled 2020-09-19 (×6): qty 2

## 2020-09-19 MED ORDER — ENOXAPARIN SODIUM 40 MG/0.4ML ~~LOC~~ SOLN: 40.0000 mg | SUBCUTANEOUS | Status: DC

## 2020-09-19 MED ORDER — FAMOTIDINE 20 MG PO TABS
40.0000 mg | ORAL_TABLET | Freq: Every day | ORAL | Status: DC | PRN
  Administered 2020-09-20: 40 mg via ORAL
  Filled 2020-09-19: qty 2

## 2020-09-19 MED ORDER — ALBUTEROL SULFATE HFA 108 (90 BASE) MCG/ACT IN AERS
2.0000 | INHALATION_SPRAY | Freq: Four times a day (QID) | RESPIRATORY_TRACT | Status: DC | PRN
  Administered 2020-09-21 – 2020-09-22 (×3): 2 via RESPIRATORY_TRACT
  Filled 2020-09-19: qty 6.7

## 2020-09-19 MED ORDER — ALLOPURINOL 100 MG PO TABS: 100.0000 mg | ORAL_TABLET | Freq: Every day | ORAL | Status: DC

## 2020-09-19 MED ORDER — ONDANSETRON HCL 4 MG PO TABS: 4.0000 mg | ORAL_TABLET | Freq: Four times a day (QID) | ORAL | Status: DC | PRN

## 2020-09-19 MED ORDER — AMIODARONE HCL 200 MG PO TABS
200.0000 mg | ORAL_TABLET | Freq: Every day | ORAL | Status: DC
  Administered 2020-09-20 – 2020-09-24 (×5): 200 mg via ORAL
  Filled 2020-09-19 (×5): qty 1

## 2020-09-19 MED ORDER — FLUTICASONE PROPIONATE 50 MCG/ACT NA SUSP
2.0000 | Freq: Every evening | NASAL | Status: DC | PRN
  Filled 2020-09-19: qty 16

## 2020-09-19 MED ORDER — ASPIRIN EC 81 MG PO TBEC
81.0000 mg | DELAYED_RELEASE_TABLET | Freq: Every day | ORAL | Status: DC
  Administered 2020-09-20 – 2020-09-24 (×5): 81 mg via ORAL
  Filled 2020-09-19 (×5): qty 1

## 2020-09-19 MED ORDER — APIXABAN 5 MG PO TABS
5.0000 mg | ORAL_TABLET | Freq: Two times a day (BID) | ORAL | Status: DC
  Administered 2020-09-19 – 2020-09-24 (×10): 5 mg via ORAL
  Filled 2020-09-19 (×10): qty 1

## 2020-09-19 NOTE — Plan of Care (Signed)

## 2020-09-19 NOTE — ED Provider Notes (Signed)
[facility] EMERGENCY DEPARTMENT Provider Note   CSN: [id number] Arrival date & time: 09/19/20  1228     History Chief Complaint  Patient presents with  . abnormal labs    [name] is a 79 y.o. female.  HPI    79 yo female history of cad, chf, presents today after call back for positive blood cultures.  Patient's daughter is also at bedside and is additional historian.  Patient began having some fever to days prior to Thanksgiving.  He has had increased frequency of urination.  She also had some congestion and coughing.  Patient has had Covid vaccine.  She is seen here yesterday had blood cultures drawn and urine and urine culture obtained.  At that time, temperature was 103.  She received Rocephin and Zithromax yesterday.  She was initiated as code sepsis.  She was discharged home and has felt reasonably well but was called back due to positive urine culture and blood cultures.  Blood cultures grew out E. coli and Enterobacterales.  Urine grew out E. coli.  Today patient has continued to have fever at home.She continues to have frequency of urination and be generally weak.  She continues to have frequency of urination to be generally weak.  Past Medical History:  Diagnosis Date  . Arthritis   . CHF (congestive heart failure) ([place])    05/2011 echo severe concentric LVH, normal systolic fxn, grade 1 diastolic dysfxn, paradoxical septal motion  . Coronary artery disease    Cath 04/2006 normal coronaries, mild-mod slow flow in all coronaries with slow distall runoff, mild-mod LV enlargement, EF 55%   . Depression   . Dyspnea   . Dysrhythmia   . GERD (gastroesophageal reflux disease)   . Hypercholesterolemia   . Hyperlipemia   . Hypertension   . MI (myocardial infarction) ([place])    NOT SURE HOW MANY  . Morbid obesity ([place])   . Nocturia   . 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 Peripheral vascular disease ([place])   . Pneumonia NONE RECENT  . Sleep  apnea    needs cpap but does not wear  . Stroke ([place])   . 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 Vertigo    takes meclizine daily    Patient Active Problem List   Diagnosis Date Noted  . Cellulitis of right leg 07/25/2020  . NSTEMI (non-ST elevated myocardial infarction) ([place]) 07/16/2020  . Chronic diastolic CHF (congestive heart failure) ([place]) 07/16/2020  . Paroxysmal atrial fibrillation ([place]) 07/16/2020  . Hypotension 07/16/2020  . Shortness of breath at rest 05/22/2020    Class: Acute  . Palpitation 05/22/2020    Class: Acute  . Shortness of breath 05/22/2020  . Acute left systolic heart failure ([place]) 06/18/2019  . Acute systolic heart failure ([place]) 06/18/2019  . Third degree AV block ([place]) 03/27/2014  . Symptomatic sinus bradycardia 03/27/2014  . Pacemaker at end of battery life 03/27/2014  . CVA (cerebral infarction) 10/17/2012  . Headache(784.0) 10/17/2012  . Coronary artery disease   . Hypertension   . Hyperlipemia   . Arthritis   . CHF (congestive heart failure) ([place])   . Vertigo   . TIA (transient ischemic attack)   . Pacemaker   . MI (myocardial infarction) ([place])   . Morbid obesity ([place])     Past Surgical History:  Procedure Laterality Date  . ABDOMINAL HYSTERECTOMY    .  CARDIAC CATHETERIZATION    . CATARACT EXTRACTION W/PHACO Left 08/29/2013   Procedure: CATARACT EXTRACTION PHACO AND INTRAOCULAR LENS PLACEMENT (IOC);  Surgeon: Adonis Brook, MD;  Location: [facility];  Service: Ophthalmology;  Laterality: Left;  . COLONOSCOPY WITH PROPOFOL N/A 01/07/2014   Procedure: COLONOSCOPY WITH PROPOFOL;  Surgeon: Juanita Craver, MD;  Location: [facility];  Service: Endoscopy;  Laterality: N/A;  . CORONARY ANGIOGRAPHY N/A 05/27/2020   Procedure: CORONARY ANGIOGRAPHY;  Surgeon: Dixie Dials, MD;  Location: [facility] CV [facility];  Service: Cardiovascular;  Laterality: N/A;  . DILATION AND CURETTAGE OF UTERUS    . INSERT /  REPLACE / REMOVE PACEMAKER    . LEFT HEART CATH AND CORONARY ANGIOGRAPHY N/A 07/18/2020   Procedure: LEFT HEART CATH AND CORONARY ANGIOGRAPHY;  Surgeon: Dixie Dials, MD;  Location: [facility];  Service: Cardiovascular;  Laterality: N/A;  . PACEMAKER GENERATOR CHANGE N/A 03/27/2014   Procedure: PACEMAKER GENERATOR CHANGE;  Surgeon: Sanda Klein, MD;  Location: [facility];  Service: Cardiovascular;  Laterality: N/A;  . PACEMAKER INSERTION    . PACEMAKER INSERTION    . TUBAL LIGATION    . VASCULAR SURGERY     vericose vein surgery     OB History   No obstetric history on file.     History reviewed. No pertinent family history.  Social History   Tobacco Use  . Smoking status: Never Smoker  . Smokeless tobacco: Never Used  Vaping Use  . Vaping Use: Never used  Substance Use Topics  . Alcohol use: No  . Drug use: No    Home Medications Prior to Admission medications   Medication Sig Start Date End Date Taking? Authorizing Provider  albuterol (VENTOLIN HFA) 108 (90 Base) MCG/ACT inhaler Inhale 2 puffs into the lungs every 6 (six) hours as needed for wheezing or shortness of breath.  10/12/19   [provider]  allopurinol (ZYLOPRIM) 100 MG tablet Take 100 mg by mouth daily.    [provider]  amiodarone (PACERONE) 200 MG tablet Take 200 mg by mouth daily.    [provider]  apixaban (ELIQUIS) 5 MG TABS tablet Take 5 mg by mouth 2 (two) times daily. 02/11/17   [provider]  aspirin EC 81 MG tablet Take 81 mg by mouth daily.    [provider]  clotrimazole (LOTRIMIN) 1 % cream Apply 1 application topically daily as needed (to any rashes).  11/22/19   [provider]  famotidine (PEPCID) 40 MG tablet Take 40 mg by mouth daily as needed for heartburn or indigestion.  11/29/19   [provider]  fluticasone (FLONASE) 50 MCG/ACT nasal spray Place 2 sprays into both nostrils at bedtime as needed for allergies or  rhinitis.  07/08/19   [provider]  furosemide (LASIX) 40 MG tablet Take 40 mg by mouth in the morning.     [provider]  metoprolol tartrate (LOPRESSOR) 25 MG tablet Take 0.5 tablets (12.5 mg total) by mouth 2 (two) times daily. 07/25/20 08/24/20  Terrilee Croak, MD  nitroGLYCERIN (NITROSTAT) 0.4 MG SL tablet Place 0.4 mg under the tongue every 5 (five) minutes as needed for chest pain.     [provider]  oxybutynin (DITROPAN) 5 MG tablet Take 5 mg by mouth 2 (two) times daily.    [provider]  oxyCODONE-acetaminophen (PERCOCET/ROXICET) 5-325 MG tablet Take 1 tablet by mouth See admin instructions. Take 1 tablet by mouth two to three times a day  11/20/19   [provider]  pantoprazole (PROTONIX) 40 MG tablet Take 40 mg by mouth 2 (two) times daily.     [provider]  potassium chloride SA (KLOR-CON) 20 MEQ tablet Take 1 tablet (20 mEq total) by mouth daily. 07/25/20 08/24/20  Terrilee Croak, MD  Psyllium (METAMUCIL PO) Take by mouth See admin instructions. Mix 1 teaspoonful into 8 ounces of water and drink once a day    [provider]  rosuvastatin (CRESTOR) 10 MG tablet Take 10 mg by mouth at bedtime.     [provider]    Allergies    Ampicillin, Penicillins, and Linzess [linaclotide]  Review of Systems   Review of Systems  All other systems reviewed and are negative.   Physical Exam Updated Vital Signs BP 99/64   Pulse 71   Temp (!) 101.3 F (38.5 C) (Oral)   Resp 15   SpO2 95%   Physical Exam Vitals and nursing note reviewed.  Constitutional:      General: She is not in acute distress.    Appearance: Normal appearance. She is obese. She is ill-appearing.     Comments: Hypotensive and febrile  HENT:     Head: Normocephalic.     Right Ear: External ear normal.     Left Ear: External ear normal.     Nose: Nose normal.     Mouth/Throat:     Mouth: Mucous membranes are moist.  Eyes:      Pupils: Pupils are equal, round, and reactive to light.  Cardiovascular:     Pulses: Normal pulses.  Pulmonary:     Effort: Pulmonary effort is normal.  Abdominal:     General: Bowel sounds are normal.     Palpations: Abdomen is soft.  Musculoskeletal:        General: Normal range of motion.     Cervical back: Normal range of motion.     Comments: Chronic skin changes bilateral lower extremities with mild erythema  Skin:    General: Skin is warm and dry.     Capillary Refill: Capillary refill takes less than 2 seconds.  Neurological:     General: No focal deficit present.     Mental Status: She is alert.  Psychiatric:        Mood and Affect: Mood normal.     ED Results / Procedures / Treatments   Labs (all labs ordered are listed, but only abnormal results are displayed) Labs Reviewed  CULTURE, BLOOD (ROUTINE X 2)  CULTURE, BLOOD (ROUTINE X 2)  URINE CULTURE  CBC  LACTIC ACID, PLASMA  LACTIC ACID, PLASMA  COMPREHENSIVE METABOLIC PANEL  CBC WITH DIFFERENTIAL/PLATELET  PROTIME-INR  APTT  URINALYSIS, ROUTINE W REFLEX MICROSCOPIC    EKG None  Radiology DG Abdomen 1 View  Result Date: 09/18/2020 CLINICAL DATA:  Abdomen pain history of constipation EXAM: ABDOMEN - 1 VIEW COMPARISON:  05/30/2012 FINDINGS: Nonobstructed gas pattern with mild to moderate stool. No radiopaque calculi. IMPRESSION: Nonobstructed gas pattern with mild to moderate stool. Electronically Signed   By: Donavan Foil M.D.   On: 09/18/2020 18:08   DG Chest Port 1 View  Result Date: 09/18/2020 CLINICAL DATA:  Chest pain, questionable sepsis, history of pneumonia EXAM: PORTABLE CHEST 1 VIEW COMPARISON:  Portable exam 1358 hours compared to 07/15/2020 FINDINGS: RIGHT subclavian pacemaker leads project over RIGHT atrium and RIGHT ventricle. Enlargement of cardiac silhouette with slight vascular congestion. Probable mass effect upon RIGHT lateral aspect of the trachea,  despite patient rotation to LEFT.  Atherosclerotic calcification aorta. RIGHT basilar atelectasis with under penetration of LEFT lung base. Remaining lungs clear. No pleural effusion or pneumothorax. Osseous structures unremarkable. IMPRESSION: Enlargement of cardiac silhouette with vascular congestion post pacemaker. RIGHT basilar atelectasis. Suspect mass effect upon RIGHT lateral aspect of the trachea despite rotation to the LEFT, raising question of RIGHT thyroid mass; prior imaging reveals patient has a markedly enlarged RIGHT thyroid lobe on a prior CT exam of 10/18/2012. Electronically Signed   By: Lavonia Dana M.D.   On: 09/18/2020 14:07    Procedures .Critical Care Performed by: Pattricia Boss, MD Authorized by: Pattricia Boss, MD   Critical care provider statement:    Critical care time (minutes):  45   Critical care end time:  09/19/2020 3:26 PM   Critical care was necessary to treat or prevent imminent or life-threatening deterioration of the following conditions:  Sepsis   Critical care was time spent personally by me on the following activities:  Discussions with consultants, evaluation of patient's response to treatment, examination of patient, ordering and performing treatments and interventions, ordering and review of laboratory studies, ordering and review of radiographic studies, pulse oximetry, re-evaluation of patient's condition, obtaining history from patient or surrogate and review of old charts   (including critical care time)  Medications Ordered in ED Medications  cefTRIAXone (ROCEPHIN) 2 g in sodium chloride 0.9 % 100 mL IVPB (has no administration in time range)  lactated ringers infusion (has no administration in time range)  lactated ringers bolus 1,000 mL (has no administration in time range)    And  lactated ringers bolus 1,000 mL (has no administration in time range)    And  lactated ringers bolus 1,000 mL (has no administration in time range)    And  lactated ringers bolus 1,000 mL (has no  administration in time range)    And  lactated ringers bolus 1,000 mL (has no administration in time range)  acetaminophen (TYLENOL) tablet 1,000 mg (1,000 mg Oral Given by Other 09/19/20 1340)    ED Course  I have reviewed the triage vital signs and the nursing notes.  Pertinent labs & imaging results that were available during my care of the patient were reviewed by me and considered in my medical decision making (see chart for details).   Discussed with IM resident who will see for admission 79 yo female seen last night as code sepsis, patient treated for cap wth rocephin and zithromax.  [place] back today due to positive blood cxs.  Patient grew E. coli out of urine and blood.  She had some borderline low blood pressure in the 90s here.  No new blood cultures were sent.  She is receiving fluid bolus, Rocephin 2 g IV, and repeat blood work.  Patient care discussed with internal medicine teaching service and they will see for admission. MDM Rules/Calculators/A&P                          Patient presents today with fever and low blood pressure with known positive blood cultures.  IV fluid bolus and Rocephin ordered. Final Clinical Impression(s) / ED Diagnoses Final diagnoses:  Sepsis, due to unspecified organism, unspecified whether acute organ dysfunction present [facility])    Rx / DC Orders ED Discharge Orders    None       Pattricia Boss, MD 09/19/20 1526

## 2020-09-19 NOTE — ED Triage Notes (Signed)
Pt coming from home. D/C last night but received a call that lab work was abnormal and that patient needed to come back.
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
## 2020-09-19 NOTE — ED Notes (Signed)
Admitting providers at bedside

## 2020-09-20 DIAGNOSIS — L8990 Pressure ulcer of unspecified site, unspecified stage: Secondary | ICD-10-CM | POA: Diagnosis present

## 2020-09-20 DIAGNOSIS — I5033 Acute on chronic diastolic (congestive) heart failure: Secondary | ICD-10-CM | POA: Diagnosis present

## 2020-09-20 DIAGNOSIS — R7881 Bacteremia: Secondary | ICD-10-CM

## 2020-09-20 DIAGNOSIS — B9620 Unspecified Escherichia coli [E. coli] as the cause of diseases classified elsewhere: Secondary | ICD-10-CM

## 2020-09-20 DIAGNOSIS — N1832 Chronic kidney disease, stage 3b: Secondary | ICD-10-CM

## 2020-09-20 LAB — URINE CULTURE
Culture: >=100000 — AB
Culture: NO GROWTH

## 2020-09-20 LAB — COMPREHENSIVE METABOLIC PANEL
ALT: 12 U/L (ref 0–44)
AST: 21 U/L (ref 15–41)
Albumin: 2.6 g/dL — ABNORMAL LOW (ref 3.5–5.0)
Alkaline Phosphatase: 49 U/L (ref 38–126)
Anion gap: 9 (ref 5–15)
BUN: 13 mg/dL (ref 8–23)
CO2: 23 mmol/L (ref 22–32)
Calcium: 8.2 mg/dL — ABNORMAL LOW (ref 8.9–10.3)
Chloride: 107 mmol/L (ref 98–111)
Creatinine, Ser: 1.64 mg/dL — ABNORMAL HIGH (ref 0.44–1.00)
GFR, Estimated: 32 mL/min — ABNORMAL LOW (ref >60)
Glucose, Bld: 107 mg/dL — ABNORMAL HIGH (ref 70–99)
Potassium: 3.8 mmol/L (ref 3.5–5.1)
Sodium: 139 mmol/L (ref 135–145)
Total Bilirubin: 0.4 mg/dL (ref 0.3–1.2)
Total Protein: 6.1 g/dL — ABNORMAL LOW (ref 6.5–8.1)

## 2020-09-20 LAB — CBC
HCT: 32.3 % — ABNORMAL LOW (ref 36.0–46.0)
Hemoglobin: 10.3 g/dL — ABNORMAL LOW (ref 12.0–15.0)
MCH: 30.7 pg (ref 26.0–34.0)
MCHC: 31.9 g/dL (ref 30.0–36.0)
MCV: 96.4 fL (ref 80.0–100.0)
Platelets: 122 10*3/uL — ABNORMAL LOW (ref 150–400)
RBC: 3.35 MIL/uL — ABNORMAL LOW (ref 3.87–5.11)
RDW: 16.4 % — ABNORMAL HIGH (ref 11.5–15.5)
WBC: 4.3 10*3/uL (ref 4.0–10.5)
nRBC: 0 % (ref 0.0–0.2)

## 2020-09-20 LAB — TSH: TSH: 0.36 u[IU]/mL (ref 0.350–4.500)

## 2020-09-20 MED ORDER — POLYETHYLENE GLYCOL 3350 17 G PO PACK
17.0000 g | PACK | Freq: Every day | ORAL | Status: DC
  Administered 2020-09-20 – 2020-09-24 (×4): 17 g via ORAL
  Filled 2020-09-20 (×5): qty 1

## 2020-09-20 MED ORDER — SODIUM CHLORIDE 0.9 % IV SOLN
2.0000 g | INTRAVENOUS | Status: DC
  Administered 2020-09-20: 2 g via INTRAVENOUS
  Filled 2020-09-20: qty 20
  Filled 2020-09-20: qty 2

## 2020-09-20 MED ORDER — FUROSEMIDE 10 MG/ML IJ SOLN
40.0000 mg | Freq: Once | INTRAMUSCULAR | Status: AC
  Administered 2020-09-20: 40 mg via INTRAVENOUS
  Filled 2020-09-20: qty 4

## 2020-09-20 MED ORDER — INFLUENZA VAC A&B SA ADJ QUAD 0.5 ML IM PRSY
0.5000 mL | PREFILLED_SYRINGE | INTRAMUSCULAR | Status: DC
  Filled 2020-09-20: qty 0.5

## 2020-09-20 NOTE — Progress Notes (Signed)
PT Cancellation Note  Patient Details Name: [name] MRN: [id number] DOB: 01-14-1941   Cancelled Treatment:    Reason Eval/Treat Not Completed: Fatigue/lethargy limiting ability to participate.  Attempted x 3 to see patient today.  Each time she was asleep. This afternoon, I tried to arouse her but she kept falling back asleep.  She requested to defer today and begin PT tomorrow.     Shanna Cisco 09/20/2020, 2:51 PM

## 2020-09-20 NOTE — Progress Notes (Signed)
Subjective:  Patient endorses feeling like her heart is racing and endorses shortness of breath. Denies any chest pain at this time. Discussed continuing antibiotics and giving lasix today to decrease volume status.  Objective:  Vital signs in last 24 hours: Vitals:   09/19/20 1620 09/19/20 1708 09/19/20 2009 09/20/20 0430  BP:  130/67 (!) 157/67 (!) 142/69  Pulse:  68 76 85  Resp:  20 19 19   Temp: 99.4 F (37.4 C) 99.2 F (37.3 C) 99.8 F (37.7 C) 100.2 F (37.9 C)  TempSrc: Oral Oral Oral   SpO2:  100% 94% 97%  Weight:      Height:       Physical Exam: General: Pleasant, laying in bed, no acute distress Pulm: Crackles present bilaterally to mid-lung field. Mildly increased work of breathing. MSK: Non-pitting edema bilateral LE  Assessment/Plan: Ms. Cazarez is 79yo female (she/her) with HFpEF (last TTE 07/19/20), coronary artery disease, sick sinus syndrome s/p pacemaker, paroxysmal atrial fibrillation, hypertension, chronic kidney disease stage IIIb, hyperlipidemia admitted on 11/26 for E. Coli bacteremia, now hypervolemic 2/2 IVF yesterday.  Active Problems:   Bacteremia due to Escherichia coli  #Bacteremia 2/2 E. coli infection Patient has remained afebrile since presentation yesterday. Received 3L IVF in ED, blood pressure stable this AM. Awaiting suseptibilites from BCx from 11/25. R/p culture from 11/26 NGTD. Will continue with broad-spectrum antibiotic. Patient has complained of productive cough w/ yellow sputum, but CXR without obvious lobar opacity. Given ceftriaxone has broad coverage, if patient has pneumonia it is most likely covered. If patient's symptoms persist, can consider adding atypical coverage. - C/w ceftriaxone - F/u cultures - Can consider azithromycin if no improvement  #Acute on chronic diastolic heart failure Most recent Echo 9/25 s/p LHC, revealing normal EF, mild concentric LVH. Patient received 3L IVF yesterday in ED 2/2 hypotension. This AM,  patient with 3L oxygen requirement, slightly tachypneic. On exam, rales appreciated to mid-lung fields bilaterally. Most likely from fluids yesterday. Will give one dose IV lasix today and re-assess tor further diuretic requirements. - IV lasix 40mg  today - Can increase frequency/dose as clinically indicated  #AKI on CKD IIIb sCr 1.67 > 1.64 s/p fluids yesterday (baseline 1.39 in August). UOP 900cc over last 24hr. Given minimal increase after fluids, kidney injury likely 2/2 infection v new baseline. If creatinine does not improve with antibiotics can consider renal ultrasound for further characterization.   - Trend RFP - Strict I&O's - Avoid nephrotoxic medications  #Hypertension Blood pressures have been stable since hypotensive episode yesterday in ED. Will continue to hold home antihypertensives in the setting of bacteremia. - Hold home metoprolol, hydralazine  #Constipation No BM recorded since arrival. Given she has had worsening constipation over the last few days, will add osmotic laxative. Plan to continue monitor and adjust bowel regimen as necessary. - Senokot-S QHS - Miralax QD  #Paroxysmal atrial fibrillaiton Patient asymptomatic, EKG on arrival normal sinus rhythm. Will continue anticoagulation and rhythm control. Holding beta blocker in setting of infection. - C/w home eliquis 5mg  BID, amiodarone 200mg  QD - Hold home metoprolol  #Deconditioning Will be important for patient to work closely with PT/OT and for her to have good follow-up given her current state of health and co-morbidities. Appreciate social work and case management for assistance in appropriate disposition. Will have physical and occupational therapy assess need for rehabilitation. - Appreciate CM, CSW assistance - PT/OT  DIET: HH IVF: n/a DVT PPX: home eliquis BOWEL: Senokot-S, Miralax CODE: FULL FAM COM:  Dr. Rebeca Alert was able to update Ms. Johnnie May in patient's room this morning.  Prior to  Admission Living Arrangement: Home Anticipated Discharge Location: pending PT/OT evaluation Barriers to Discharge: medical management Dispo: Anticipated discharge in approximately 3-4 day(s).   Sanjuan Dame, MD 09/20/2020, 6:30 AM Pager: [phone] After 5pm on weekdays and 1pm on weekends: On Call pager [phone]

## 2020-09-20 NOTE — Progress Notes (Signed)
OT Cancellation Note  Patient Details Name: [name] MRN: [id number] DOB: 1940-12-27   Cancelled Treatment:    Reason Eval/Treat Not Completed: Patient declined, requesting tomorrow due to lethargy.    Jamerius Boeckman D Chantale Leugers 09/20/2020, 3:12 PM

## 2020-09-20 NOTE — Progress Notes (Signed)
  Date: 09/20/2020  Patient name: [name]  Medical record number: [id number]  Date of birth: [birth year]   I have seen and evaluated this patient and I have discussed the plan of care with the house staff. Please see their note for complete details. I concur with their findings with the following additions/corrections:  79 year old woman with history of HFpEF, CAD, sick sinus syndrome with a pacemaker, and CKD 3B who initially presented 2 days ago for chest pain and fever and was discharged from the ED, only to be called to return after E. coli bacteremia was identified.  Today she reports feeling better but is somewhat short of breath and newly on oxygen.  On exam, she has JVD, bibasilar crackles, and mild epigastric tenderness, but otherwise unremarkable.  T-max 103.1.  Blood and urine cultures are growing E. coli, pansensitive. -Continue ceftriaxone for bacteremia and sepsis due to E. coli UTI -Severe sepsis with AKI on CKD 3B, improved after IV fluid resuscitation, now volume overloaded -Continue cautious diuresis, furosemide 40 mg IV today, may increase frequency if needed -Continue holding home metoprolol and hydralazine with sepsis and acute decompensated HFpEF -Wean O2 as able as she diureses  Lenice Pressman, M.D., Ph.D. 09/20/2020, 2:27 PM

## 2020-09-21 LAB — CULTURE, BLOOD (ROUTINE X 2): Special Requests: ADEQUATE

## 2020-09-21 LAB — COMPREHENSIVE METABOLIC PANEL
ALT: 20 U/L (ref 0–44)
AST: 34 U/L (ref 15–41)
Albumin: 2.5 g/dL — ABNORMAL LOW (ref 3.5–5.0)
Alkaline Phosphatase: 49 U/L (ref 38–126)
Anion gap: 9 (ref 5–15)
BUN: 13 mg/dL (ref 8–23)
CO2: 25 mmol/L (ref 22–32)
Calcium: 8 mg/dL — ABNORMAL LOW (ref 8.9–10.3)
Chloride: 105 mmol/L (ref 98–111)
Creatinine, Ser: 1.56 mg/dL — ABNORMAL HIGH (ref 0.44–1.00)
GFR, Estimated: 34 mL/min — ABNORMAL LOW (ref >60)
Glucose, Bld: 105 mg/dL — ABNORMAL HIGH (ref 70–99)
Potassium: 3.7 mmol/L (ref 3.5–5.1)
Sodium: 139 mmol/L (ref 135–145)
Total Bilirubin: 0.4 mg/dL (ref 0.3–1.2)
Total Protein: 6.1 g/dL — ABNORMAL LOW (ref 6.5–8.1)

## 2020-09-21 LAB — CBC
HCT: 32.8 % — ABNORMAL LOW (ref 36.0–46.0)
Hemoglobin: 10.2 g/dL — ABNORMAL LOW (ref 12.0–15.0)
MCH: 30.6 pg (ref 26.0–34.0)
MCHC: 31.1 g/dL (ref 30.0–36.0)
MCV: 98.5 fL (ref 80.0–100.0)
Platelets: 117 10*3/uL — ABNORMAL LOW (ref 150–400)
RBC: 3.33 MIL/uL — ABNORMAL LOW (ref 3.87–5.11)
RDW: 16.2 % — ABNORMAL HIGH (ref 11.5–15.5)
WBC: 4.5 10*3/uL (ref 4.0–10.5)
nRBC: 0 % (ref 0.0–0.2)

## 2020-09-21 LAB — PHOSPHORUS: Phosphorus: 3.5 mg/dL (ref 2.5–4.6)

## 2020-09-21 LAB — MAGNESIUM: Magnesium: 1.8 mg/dL (ref 1.7–2.4)

## 2020-09-21 MED ORDER — FUROSEMIDE 10 MG/ML IJ SOLN
40.0000 mg | Freq: Every day | INTRAMUSCULAR | Status: DC
  Administered 2020-09-21: 40 mg via INTRAVENOUS
  Filled 2020-09-21: qty 4

## 2020-09-21 MED ORDER — CEFAZOLIN SODIUM-DEXTROSE 2-4 GM/100ML-% IV SOLN
2.0000 g | Freq: Three times a day (TID) | INTRAVENOUS | Status: DC
  Administered 2020-09-21 – 2020-09-22 (×3): 2 g via INTRAVENOUS
  Filled 2020-09-21 (×3): qty 100

## 2020-09-21 MED ORDER — POTASSIUM CHLORIDE ER 10 MEQ PO TBCR
10.0000 meq | EXTENDED_RELEASE_TABLET | Freq: Once | ORAL | Status: AC
  Administered 2020-09-21: 10 meq via ORAL
  Filled 2020-09-21: qty 1

## 2020-09-21 MED ORDER — DICLOFENAC SODIUM 1 % EX GEL
2.0000 g | Freq: Four times a day (QID) | CUTANEOUS | Status: DC | PRN
  Administered 2020-09-21: 17:00:00 2 g via TOPICAL
  Filled 2020-09-21: qty 100

## 2020-09-21 MED ORDER — HYDROCERIN EX CREA
1.0000 "application " | TOPICAL_CREAM | Freq: Every day | CUTANEOUS | Status: DC
  Administered 2020-09-21 – 2020-09-24 (×4): 1 via TOPICAL
  Filled 2020-09-21: qty 113

## 2020-09-21 NOTE — Evaluation (Signed)
Physical Therapy Evaluation Patient Details Name: [name] MRN: [id number] DOB: 1941/08/29 Today's Date: 09/21/2020   History of Present Illness  79 year old woman admitted with bacteremia and sepsis due to E Coli UTI.  PMH significant for  HFpEF, CAD, sick sinus syndrome with a pacemaker, CKD, HTN.  Clinical Impression   Pt admitted with above diagnosis. Comes from home where she lives in a single level house wit 2 steps to enter with her grandson, who does not give much assist above home management; Has an Aide who comes for 1.5 hours daily; Prior to admission, able to walk modest household distances with RW; Presents to PT with generalized muscle weakness, decr functional mobility, decr activity tolerance;   Pt currently with functional limitations due to the deficits listed below (see PT Problem List). Pt will benefit from skilled PT to increase their independence and safety with mobility to allow discharge to the venue listed below.       Follow Up Recommendations CIR    Equipment Recommendations  Wheelchair (measurements PT);Wheelchair cushion (measurements PT);Rolling walker with 5" wheels;3in1 (PT) Judie Petit)    Recommendations for Other Services       Precautions / Restrictions Precautions Precautions: Fall Restrictions Weight Bearing Restrictions: No      Mobility  Bed Mobility Overal bed mobility: Needs Assistance Bed Mobility: Supine to Sit;Sit to Supine Rolling: (P) Mod assist   Supine to sit: Min assist;+2 for physical assistance;+2 for safety/equipment Sit to supine: Min assist;+2 for physical assistance;+2 for safety/equipment   General bed mobility comments: assist to initate legs and pull up trunk into upright sitting from bariatric air mattress; assist with legs and controlled lowering of trunk back onto bed    Transfers Overall transfer level: Needs assistance Equipment used: Rolling walker (2 wheeled) Transfers: Sit to/from Stand Sit to  Stand: Mod assist;+2 physical assistance;+2 safety/equipment         General transfer comment: assist to rise and steady with extreme forward flexion prior to coming up to full stand. Able to take few small side steps up to [facility]  Ambulation/Gait             General Gait Details: Small sidesteps to [facility] with RW  Stairs            Wheelchair Mobility    Modified Rankin (Stroke Patients Only)       Balance Overall balance assessment: Needs assistance Sitting-balance support: Feet supported Sitting balance-Leahy Scale: Good     Standing balance support: Bilateral upper extremity supported;During functional activity Standing balance-Leahy Scale: Poor Standing balance comment: reliant on external support of RW and PT/OT                             Pertinent Vitals/Pain Pain Assessment: (P) 0-10 Pain Score: (P) 7  Pain Location: back; bil knees with WBing Pain Descriptors / Indicators: Aching;Sore Pain Intervention(s): Limited activity within patient's tolerance;Monitored during session;Repositioned    Home Living Family/patient expects to be discharged to:: (P) Private residence Living Arrangements: (P) Other (Comment) (Grandson is there but does not provide care) Available Help at Discharge: (P) Family;Personal care attendant;Available PRN/intermittently Type of Home: (P) House Home Access: (P) Stairs to enter Entrance Stairs-Rails: (P) None Entrance Stairs-Number of Steps: (P) 2 Home Layout: (P) One level Home Equipment: (P) Walker - 2 wheels;Wheelchair - [employer];Shower seat;Hand held shower head;Grab bars - tub/shower Additional Comments: (P) HHA 5x per week 1.5 hours at a  time    Prior Function Level of Independence: (P) Needs assistance   Gait / Transfers Assistance Needed: (P) Pt able to mobilize short household distances with RW  ADL's / Homemaking Assistance Needed: (P) uses BSC occasionally; Aide helps wash lower half and  manage household        Hand Dominance   Dominant Hand: (P) Right    Extremity/Trunk Assessment   Upper Extremity Assessment Upper Extremity Assessment: Generalized weakness    Lower Extremity Assessment Lower Extremity Assessment: Generalized weakness;Defer to PT evaluation    Cervical / Trunk Assessment Cervical / Trunk Assessment: (P) Other exceptions Cervical / Trunk Exceptions: (P) Morbid obesity  Communication   Communication: (P) No difficulties  Cognition Arousal/Alertness: Awake/alert Behavior During Therapy: WFL for tasks assessed/performed Overall Cognitive Status: Impaired/Different from baseline Area of Impairment: Problem solving                             Problem Solving: Slow processing;Requires verbal cues General Comments: increased time and cues to process basic task mobility tasks      General Comments      Exercises     Assessment/Plan    PT Assessment Patient needs continued PT services  PT Problem List Decreased strength;Decreased range of motion;Decreased activity tolerance;Decreased balance;Decreased mobility;Decreased coordination;Decreased cognition;Decreased knowledge of use of DME;Decreased safety awareness;Decreased knowledge of precautions;Obesity;Pain       PT Treatment Interventions DME instruction;Gait training;Functional mobility training;Therapeutic activities;Therapeutic exercise;Balance training;Cognitive remediation;Patient/family education    PT Goals (Current goals can be found in the Care Plan section)  Acute Rehab PT Goals Patient Stated Goal: return to independence PT Goal Formulation: With patient Time For Goal Achievement: 10/05/20 Potential to Achieve Goals: Good    Frequency Min 3X/week   Barriers to discharge        Co-evaluation PT/OT/SLP Co-Evaluation/Treatment: (P) Yes Reason for Co-Treatment: For patient/therapist safety;To address functional/ADL transfers PT goals addressed during  session: (P) Mobility/safety with mobility OT goals addressed during session: ADL's and self-care;Proper use of Adaptive equipment and DME;Strengthening/ROM       AM-PAC PT "6 Clicks" Mobility  Outcome Measure Help needed turning from your back to your side while in a flat bed without using bedrails?: A Lot Help needed moving from lying on your back to sitting on the side of a flat bed without using bedrails?: A Lot Help needed moving to and from a bed to a chair (including a wheelchair)?: A Lot Help needed standing up from a chair using your arms (e.g., wheelchair or bedside chair)?: A Lot Help needed to walk in hospital room?: A Lot Help needed climbing 3-5 steps with a railing? : Total 6 Click Score: 11    End of Session Equipment Utilized During Treatment:  (Bari RW) Activity Tolerance: Patient tolerated treatment well Patient left: in bed;with call bell/phone within reach;Other (comment) (attempted to get bed in semi-chair position) Nurse Communication: Mobility status PT Visit Diagnosis: Unsteadiness on feet (R26.81);Other abnormalities of gait and mobility (R26.89);Muscle weakness (generalized) (M62.81);Difficulty in walking, not elsewhere classified (R26.2)    Time: 1520-1550 PT Time Calculation (min) (ACUTE ONLY): 30 min   Charges:   PT Evaluation $PT Eval Moderate Complexity: 1 Mod          Roney [place], [place]  Acute Rehabilitation Services Pager [phone] Office [phone]   Colletta [place] 09/21/2020, 5:41 PM

## 2020-09-21 NOTE — Consult Note (Signed)
WOC consulted for venous stasis dermatitis. Verified with bedside nursing no open wounds via secure chart. Updated POC for use of Eucerin for the dermatitis associated with venous stasis.    Elevate legs as much as possible.   Re consult if needed, will not follow at this time. Thanks  Caydyn Sprung [employer], RN,CWOCN, CNS, [place] [phone])

## 2020-09-21 NOTE — Evaluation (Signed)
Occupational Therapy Evaluation Patient Details Name: [name] MRN: [id number] DOB: August 28, 1941 Today's Date: 09/21/2020    History of Present Illness 79 year old woman admitted with bacteremia and sepsis due to E Coli UTI.  PMH significant for  HFpEF, CAD, sick sinus syndrome with a pacemaker, CKD, HTN.   Clinical Impression   PTA pt living with grandson and functioning at mod I level using RW. Pt normally completes mobility a household distance and has support of a HHA for light housework. At time of eval, pt able to complete bed mobility at min A +2 level and sit <> stands with mod A +2 and RW. Pt presenting with poor activity tolerance and generalized weakness limiting ability to complete ADL at desired level of independence. Pt on 3L HFNC during session to maintain SpO2 sats >95%. Given current status, recommend CIR at d/c for intensive therapies to progress ADL independence and safety prior to d/c home. Will continue to follow per POC listed below.     Follow Up Recommendations  CIR    Equipment Recommendations  3 in 1 bedside commode;Wheelchair (measurements OT);Wheelchair cushion (measurements OT);Hospital bed (bariatric)    Recommendations for Other Services Rehab consult     Precautions / Restrictions Precautions Precautions: Fall Restrictions Weight Bearing Restrictions: No      Mobility Bed Mobility Overal bed mobility: Needs Assistance Bed Mobility: Supine to Sit;Sit to Supine Rolling: (P) Mod assist   Supine to sit: Min assist;+2 for physical assistance;+2 for safety/equipment Sit to supine: Min assist;+2 for physical assistance;+2 for safety/equipment   General bed mobility comments: assist to initate legs and pull up trunk into upright sitting from bariatric air mattress; assist with legs and controlled lowering of trunk back onto bed    Transfers Overall transfer level: Needs assistance Equipment used: Rolling walker (2 wheeled) Transfers: Sit  to/from Stand Sit to Stand: Mod assist;+2 physical assistance;+2 safety/equipment         General transfer comment: assist to rise and steady with extreme forward flexion prior to coming up to full stand. Able to take few small side steps up to [place] Overall balance assessment: Needs assistance Sitting-balance support: Feet supported Sitting balance-Leahy Scale: Good     Standing balance support: Bilateral upper extremity supported;During functional activity Standing balance-Leahy Scale: Poor Standing balance comment: reliant on external support of RW and PT/OT                           ADL either performed or assessed with clinical judgement   ADL Overall ADL's : Needs assistance/impaired Eating/Feeding: Set up;Sitting   Grooming: Set up;Sitting   Upper Body Bathing: Moderate assistance;Sitting   Lower Body Bathing: Maximal assistance;Sitting/lateral leans;Sit to/from stand   Upper Body Dressing : Minimal assistance;Sitting   Lower Body Dressing: Maximal assistance;Total assistance;Sitting/lateral leans;Sit to/from stand Lower Body Dressing Details (indicate cue type and reason): at baseline cannot don own socks Toilet Transfer: Moderate assistance;+2 for physical assistance;+2 for safety/equipment;BSC;RW   Toileting- Clothing Manipulation and Hygiene: Total assistance;Sitting/lateral lean;Sit to/from stand Toileting - Clothing Manipulation Details (indicate cue type and reason): incontinent     Functional mobility during ADLs: Moderate assistance;+2 for physical assistance;+2 for safety/equipment (side steps up to [facility] only)       Vision Baseline Vision/History: Wears glasses Wears Glasses: At all times Patient Visual Report: No change from baseline       Perception     Praxis  Pertinent Vitals/Pain Pain Assessment: (P) 0-10 Pain Score: (P) 7  Pain Location: back; bil knees with WBing Pain Descriptors / Indicators: Aching;Sore Pain  Intervention(s): Limited activity within patient's tolerance;Monitored during session;Repositioned     Hand Dominance (P) Right   Extremity/Trunk Assessment Upper Extremity Assessment Upper Extremity Assessment: Generalized weakness   Lower Extremity Assessment Lower Extremity Assessment: Generalized weakness;Defer to PT evaluation   Cervical / Trunk Assessment Cervical / Trunk Assessment: (P) Other exceptions Cervical / Trunk Exceptions: (P) Morbid obesity   Communication Communication Communication: (P) No difficulties   Cognition Arousal/Alertness: Awake/alert Behavior During Therapy: WFL for tasks assessed/performed Overall Cognitive Status: Impaired/Different from baseline Area of Impairment: Problem solving                             Problem Solving: Slow processing;Requires verbal cues General Comments: increased time and cues to process basic task mobility tasks   General Comments       Exercises     Shoulder Instructions      Home Living Family/patient expects to be discharged to:: (P) Private residence Living Arrangements: (P) Other (Comment) (Grandson is there but does not provide care) Available Help at Discharge: (P) Family;Personal care attendant;Available PRN/intermittently Type of Home: (P) House Home Access: (P) Stairs to enter Entrance Stairs-Number of Steps: (P) 2 Entrance Stairs-Rails: (P) None Home Layout: (P) One level     Bathroom Shower/Tub: (P) Walk-in shower;Curtain   Bathroom Toilet: (P) Standard Bathroom Accessibility: (P) Yes   Home Equipment: (P) Walker - 2 wheels;Wheelchair - [employer];Shower seat;Hand held shower head;Grab bars - tub/shower   Additional Comments: (P) HHA 5x per week 1.5 hours at a time      Prior Functioning/Environment Level of Independence: (P) Needs assistance  Gait / Transfers Assistance Needed: (P) Pt able to mobilize short household distances with RW ADL's / Homemaking  Assistance Needed: (P) uses BSC occasionally; Aide helps wash lower half and manage household            OT Problem List: Decreased strength;Decreased knowledge of use of DME or AE;Decreased activity tolerance;Cardiopulmonary status limiting activity;Impaired balance (sitting and/or standing)      OT Treatment/Interventions: Self-care/ADL training;Therapeutic exercise;Patient/family education;Balance training;Energy conservation;Therapeutic activities;DME and/or AE instruction    OT Goals(Current goals can be found in the care plan section) Acute Rehab OT Goals Patient Stated Goal: return to independence OT Goal Formulation: With patient/family Time For Goal Achievement: 10/05/20 Potential to Achieve Goals: Good  OT Frequency: Min 2X/week   Barriers to D/C:            Co-evaluation PT/OT/SLP Co-Evaluation/Treatment: Yes Reason for Co-Treatment: For patient/therapist safety;To address functional/ADL transfers PT goals addressed during session: (P) Mobility/safety with mobility OT goals addressed during session: ADL's and self-care;Proper use of Adaptive equipment and DME;Strengthening/ROM      AM-PAC OT "6 Clicks" Daily Activity     Outcome Measure Help from another person eating meals?: A Little Help from another person taking care of personal grooming?: A Little Help from another person toileting, which includes using toliet, bedpan, or urinal?: Total Help from another person bathing (including washing, rinsing, drying)?: A Lot Help from another person to put on and taking off regular upper body clothing?: A Lot Help from another person to put on and taking off regular lower body clothing?: Total 6 Click Score: 12   End of Session Equipment Utilized During Treatment: Rolling walker;Oxygen Nurse Communication: Mobility status  Activity  Tolerance: Patient tolerated treatment well Patient left: in chair;with call bell/phone within reach  OT Visit Diagnosis: Unsteadiness  on feet (R26.81);Other abnormalities of gait and mobility (R26.89);Muscle weakness (generalized) (M62.81)                Time: 4076-8088 OT Time Calculation (min): 45 min Charges:  OT General Charges $OT Visit: 1 Visit OT Evaluation $OT Eval Moderate Complexity: 1 Mod OT Treatments $Self Care/Home Management : 8-22 mins  Zenovia Jarred, MSOT, OTR/L Acute Rehabilitation Services [facility] Office Number: [phone] Pager: [phone]  Zenovia Jarred 09/21/2020, 5:37 PM

## 2020-09-21 NOTE — Plan of Care (Signed)

## 2020-09-21 NOTE — Progress Notes (Signed)
Subjective: HD 2 Overnight, no acute events reported.  This morning, Ms [name] was evaluated at bedside. She is resting comfortably in bed and notes feeling better this morning. She has improved energy and some improvement in her breathing, although still requiring oxygen supplementation. She endorses persistent right shoulder/arm pain since receiving her COVID shot.   Objective:  Vital signs in last 24 hours: Vitals:   09/20/20 0430 09/20/20 1338 09/20/20 2040 09/21/20 0454  BP: (!) 142/69 (!) 100/52 140/70 126/71  Pulse: 85 73 68 75  Resp: 19 17 20    Temp: 100.2 F (37.9 C) 99.7 F (37.6 C) 98.4 F (36.9 C) 98.7 F (37.1 C)  TempSrc:  Oral Oral Oral  SpO2: 97% 97% 97% 96%  Weight:      Height:       CBC Latest Ref Rng & Units 09/21/2020 09/20/2020 09/19/2020  WBC 4.0 - 10.5 K/uL 4.5 4.3 4.7  Hemoglobin 12.0 - 15.0 g/dL 10.2(L) 10.3(L) 11.4(L)  Hematocrit 36 - 46 % 32.8(L) 32.3(L) 36.4  Platelets 150 - 400 K/uL 117(L) 122(L) 137(L)   BMP Latest Ref Rng & Units 09/21/2020 09/20/2020 09/19/2020  Glucose 70 - 99 mg/dL 105(H) 107(H) 101(H)  BUN 8 - 23 mg/dL 13 13 14   Creatinine 0.44 - 1.00 mg/dL 1.56(H) 1.64(H) 1.67(H)  Sodium 135 - 145 mmol/L 139 139 140  Potassium 3.5 - 5.1 mmol/L 3.7 3.8 4.1  Chloride 98 - 111 mmol/L 105 107 106  CO2 22 - 32 mmol/L 25 23 23   Calcium 8.9 - 10.3 mg/dL 8.0(L) 8.2(L) 8.7(L)   Physical Exam  Constitutional: obese female, No distress.  Cardiovascular: Normal rate, regular rhythm, S1 and S2 present, systolic murmur, no rubs, gallops.  Distal pulses intact Respiratory: No respiratory distress, no accessory muscle use. Bibasilar crackles, on 3L O2 Musculoskeletal: Normal bulk and tone. Nonpitting edema of bilateral lower extremities  Neurological: Is alert and oriented x4, no apparent focal deficits noted. Skin: warm and dry; chronic venous stasis changes of bilat lower extremities   Assessment/Plan:  Principal Problem:    Bacteremia due to Escherichia coli Active Problems:   Morbid obesity ([place])   Paroxysmal atrial fibrillation (H[facility])   Pressure injury of skin   Acute on chronic heart failure with preserved ejection fraction (HFpEF) [facility])  Ms [name] is a 79 year old female with Hx of HFpEF, CAD, SSS s/p pacemaker, PAF, HTN, CKDIIIb, and HLD admitted for E coli bacteremia.   E coli bacteremia: Patient has remained afebrile on ceftriaxone over past 24 hours. She endorses overall improved energy this morning. No leukocytosis. Urine cx sensitivities with pan-sensitive E.coli. At this time, will switch to cefazolin for four days to complete seven days of antibiotic therapy. - Cefazolin 2g q8h  - CBC f/u   Acute on chronic diastolic heart failure: Patient given one dose of IV Lasix 40mg  yesterday in setting of hypervolemia with good urine output. Volume status is difficult to assess given her body habitus; however, continues to have 3L oxygen requirement. Patient does have persistent bibasilar crackles on exam and would benefit from further diuresis.  - IV Lasix 40mg  daily x2 days - Strict I&O - Monitor electrolytes   AKI on CKDIIIb sCr 1.64>1.56 following IV Lasix 40mg  yesterday with 2.7L UOP. Suspect her AKI was pre-renal in setting of heart failure as above. Will continue diuresis as above.  - Continue to monitor renal function - Strict I&O - Avoid nephrotoxic medications   Hypertension: Currently normotensive. Will resume home  metoprolol and hydralazine after diuresis.  Paroxysmal atrial fibrillation:  CHADS2-VASc score 7. EKG on arrival in [facility].  - Continue Eliquis 5mg  bid for anticoagulation - Continue amiodarone 200mg  daily for rate control - Holding home metoprolol at this time   DIET: [place] IVF: n/a DVT PPX: home eliquis BOWEL: Senokot-S, Miralax CODE: FULL  Prior to Admission Living Arrangement: Home  Anticipated Discharge Location: Pending PT/OT eval Barriers to Discharge:  Continued medical management  Dispo: Anticipated discharge in approximately 2-3 day(s).   Harvie Heck, MD  IM[facility] PGY-2 09/21/2020, 7:10 AM Pager: [phone] After 5pm on weekdays and 1pm on weekends: On Call pager [phone]

## 2020-09-21 NOTE — Consult Note (Signed)
Referring Physician:  [name] is an 79 y.o. female.                       Chief Complaint: Palpitation and leg edema  HPI: 79 years old female seen per patient request. She is here for E.Coli bacteremia and urosepsis. She has PMH of HFpEF, CAD, Sick sinus syndrome with a pacemaker, CKD 3B, Morbid obesity, xertional dyspnea and chronic venous stasis dermatitis.  She had good diuresis yesterday. Her vital signs are stable.  Past Medical History:  Diagnosis Date  . Arthritis   . CHF (congestive heart failure) ([place])    05/2011 echo severe concentric LVH, normal systolic fxn, grade 1 diastolic dysfxn, paradoxical septal motion  . Coronary artery disease    Cath 04/2006 normal coronaries, mild-mod slow flow in all coronaries with slow distall runoff, mild-mod LV enlargement, EF 55%   . Depression   . Dyspnea   . Dysrhythmia   . GERD (gastroesophageal reflux disease)   . Hypercholesterolemia   . Hyperlipemia   . Hypertension   . MI (myocardial infarction) ([place])    NOT SURE HOW MANY  . Morbid obesity ([place])   . Nocturia   . 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 Peripheral vascular disease ([place])   . Pneumonia NONE RECENT  . Sleep apnea    needs cpap but does not wear  . Stroke ([place])   . 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 Vertigo    takes meclizine daily      Past Surgical History:  Procedure Laterality Date  . ABDOMINAL HYSTERECTOMY    . CARDIAC CATHETERIZATION    . CATARACT EXTRACTION W/PHACO Left 08/29/2013   Procedure: CATARACT EXTRACTION PHACO AND INTRAOCULAR LENS PLACEMENT (IOC);  Surgeon: Adonis Brook, MD;  Location: [facility];  Service: Ophthalmology;  Laterality: Left;  . COLONOSCOPY WITH PROPOFOL N/A 01/07/2014   Procedure: COLONOSCOPY WITH PROPOFOL;  Surgeon: Juanita Craver, MD;  Location: [facility];  Service: Endoscopy;  Laterality: N/A;  . CORONARY ANGIOGRAPHY N/A 05/27/2020   Procedure: CORONARY  ANGIOGRAPHY;  Surgeon: Dixie Dials, MD;  Location: [facility];  Service: Cardiovascular;  Laterality: N/A;  . DILATION AND CURETTAGE OF UTERUS    . INSERT / REPLACE / REMOVE PACEMAKER    . LEFT HEART CATH AND CORONARY ANGIOGRAPHY N/A 07/18/2020   Procedure: LEFT HEART CATH AND CORONARY ANGIOGRAPHY;  Surgeon: Dixie Dials, MD;  Location: [facility];  Service: Cardiovascular;  Laterality: N/A;  . PACEMAKER GENERATOR CHANGE N/A 03/27/2014   Procedure: PACEMAKER GENERATOR CHANGE;  Surgeon: Sanda Klein, MD;  Location: [facility];  Service: Cardiovascular;  Laterality: N/A;  . PACEMAKER INSERTION    . PACEMAKER INSERTION    . TUBAL LIGATION    . VASCULAR SURGERY     vericose vein surgery    History reviewed. No pertinent family history. Social History:  reports that she has never smoked. She has never used smokeless tobacco. She reports that she does not drink alcohol and does not use drugs.  Allergies:  Allergies  Allergen Reactions  . Ampicillin Other (See Comments)    Increases heart rhythm  Has tolerated ceftriaxone in the past - 06/2020  . Penicillins Other (See Comments)    Increases heart rhythm   . Linzess [Linaclotide] Other (See Comments)    Severe stomach pains    Medications Prior to  Admission  Medication Sig Dispense Refill  . acetaminophen (TYLENOL) 500 MG tablet Take 1,000 mg by mouth every 6 (six) hours as needed for moderate pain.    [name] Kitchen albuterol (VENTOLIN HFA) 108 (90 Base) MCG/ACT inhaler Inhale 2 puffs into the lungs every 6 (six) hours as needed for wheezing or shortness of breath.     . allopurinol (ZYLOPRIM) 100 MG tablet Take 100 mg by mouth daily.    [name] Kitchen amiodarone (PACERONE) 200 MG tablet Take 200 mg by mouth daily.    [name] Kitchen apixaban (ELIQUIS) 5 MG TABS tablet Take 5 mg by mouth 2 (two) times daily.    [name] Kitchen aspirin EC 81 MG tablet Take 81 mg by mouth daily.    . famotidine (PEPCID) 40 MG tablet Take 40 mg by mouth daily as needed for heartburn or  indigestion.     . fluticasone (FLONASE) 50 MCG/ACT nasal spray Place 2 sprays into both nostrils at bedtime as needed for allergies or rhinitis.     . furosemide (LASIX) 40 MG tablet Take 40 mg by mouth in the morning.     . hydrALAZINE (APRESOLINE) 50 MG tablet Take 50 mg by mouth 3 (three) times daily.    . meclizine (ANTIVERT) 25 MG tablet Take 25 mg by mouth 3 (three) times daily as needed for dizziness or nausea.    . metoprolol tartrate (LOPRESSOR) 25 MG tablet Take 0.5 tablets (12.5 mg total) by mouth 2 (two) times daily. 30 tablet 0  . nitroGLYCERIN (NITROSTAT) 0.4 MG SL tablet Place 0.4 mg under the tongue every 5 (five) minutes as needed for chest pain.     [name] Kitchen oxybutynin (DITROPAN) 5 MG tablet Take 5 mg by mouth 2 (two) times daily.    [name] Kitchen oxyCODONE-acetaminophen (PERCOCET/ROXICET) 5-325 MG tablet Take 1 tablet by mouth See admin instructions. Take 1 tablet by mouth two to three times a day    . pantoprazole (PROTONIX) 40 MG tablet Take 40 mg by mouth 2 (two) times daily.     . potassium chloride SA (KLOR-CON) 20 MEQ tablet Take 1 tablet (20 mEq total) by mouth daily. (Patient taking differently: Take 20 mEq by mouth 2 (two) times daily. ) 30 tablet 0  . rosuvastatin (CRESTOR) 10 MG tablet Take 10 mg by mouth daily.       Results for orders placed or performed during the hospital encounter of 09/19/20 (from the past 48 hour(s))  Urine culture     Status: None   Collection Time: 09/19/20  2:17 PM   Specimen: In/Out Cath Urine  Result Value Ref Range   Specimen Description IN/OUT CATH URINE    Special Requests NONE    Culture      NO GROWTH Performed at [facility] [address], [place], [place] [address]    Report Status 09/20/2020 FINAL   Blood Culture (routine x 2)     Status: None (Preliminary result)   Collection Time: 09/19/20  2:22 PM   Specimen: BLOOD RIGHT HAND  Result Value Ref Range   Specimen Description BLOOD RIGHT HAND    Special Requests      BOTTLES  DRAWN AEROBIC AND ANAEROBIC Blood Culture adequate volume   Culture      NO GROWTH 2 DAYS Performed at [facility], [place] [address] [place], [place] [address]    Report Status PENDING   Lactic acid, plasma     Status: None   Collection Time: 09/19/20  2:43 PM  Result Value Ref Range   Lactic Acid, Venous 1.2 0.5 - 1.9 mmol/L    Comment: Performed at [facility] [address], [place], [place] [address]  Comprehensive metabolic panel     Status: Abnormal   Collection Time: 09/19/20  2:43 PM  Result Value Ref Range   Sodium 140 135 - 145 mmol/L   Potassium 4.1 3.5 - 5.1 mmol/L   Chloride 106 98 - 111 mmol/L   CO2 23 22 - 32 mmol/L   Glucose, Bld 101 (H) 70 - 99 mg/dL    Comment: Glucose reference range applies only to samples taken after fasting for at least 8 hours.   BUN 14 8 - 23 mg/dL   Creatinine, Ser 1.67 (H) 0.44 - 1.00 mg/dL   Calcium 8.7 (L) 8.9 - 10.3 mg/dL   Total Protein 7.3 6.5 - 8.1 g/dL   Albumin 3.1 (L) 3.5 - 5.0 g/dL   AST 27 15 - 41 U/L   ALT 13 0 - 44 U/L   Alkaline Phosphatase 59 38 - 126 U/L   Total Bilirubin 0.7 0.3 - 1.2 mg/dL   GFR, Estimated 32 (L) >60 mL/min    Comment: (NOTE) Calculated using the CKD-EPI Creatinine Equation (2021)    Anion gap 11 5 - 15    Comment: Performed at [facility] [address] [place], [place] [address]  CBC WITH DIFFERENTIAL     Status: Abnormal   Collection Time: 09/19/20  2:43 PM  Result Value Ref Range   WBC 4.7 4.0 - 10.5 K/uL   RBC 3.69 (L) 3.87 - 5.11 MIL/uL   Hemoglobin 11.4 (L) 12.0 - 15.0 g/dL   HCT 36.4 36 - 46 %   MCV 98.6 80.0 - 100.0 fL   MCH 30.9 26.0 - 34.0 pg   MCHC 31.3 30.0 - 36.0 g/dL   RDW 16.5 (H) 11.5 - 15.5 %   Platelets 137 (L) 150 - 400 K/uL   nRBC 0.0 0.0 - 0.2 %   Neutrophils Relative % 74 %   Neutro Abs 3.5 1.7 - 7.7 K/uL   Lymphocytes Relative 14 %   Lymphs Abs 0.7 0.7 - 4.0 K/uL   Monocytes Relative 10 %   Monocytes Absolute 0.5 0.1 - 1.0 K/uL    Eosinophils Relative 0 %   Eosinophils Absolute 0.0 0.0 - 0.5 K/uL   Basophils Relative 1 %   Basophils Absolute 0.0 0.0 - 0.1 K/uL   Immature Granulocytes 1 %   Abs Immature Granulocytes 0.03 0.00 - 0.07 K/uL    Comment: Performed at [facility] [address] [place], [place] [address]  Protime-INR     Status: Abnormal   Collection Time: 09/19/20  2:43 PM  Result Value Ref Range   Prothrombin Time 17.7 (H) 11.4 - 15.2 seconds   INR 1.5 (H) 0.8 - 1.2    Comment: (NOTE) INR goal varies based on device and disease states. Performed at [facility], [place] [address] [place], [place] [address]   APTT     Status: None   Collection Time: 09/19/20  2:43 PM  Result Value Ref Range   aPTT 29 24 - 36 seconds    Comment: Performed at [facility] [address] [place], [place] [address]  TSH     Status: None   Collection Time: 09/19/20  2:43 PM  Result Value Ref Range   TSH 0.360 0.350 - 4.500 uIU/mL  Comment: Performed by a 3rd Generation assay with a functional sensitivity of <=0.01 uIU/mL. Performed at [facility], [place] [address] [place], [place] [address]   Urinalysis, Routine w reflex microscopic Urine, Clean Catch     Status: Abnormal   Collection Time: 09/19/20  5:00 PM  Result Value Ref Range   Color, Urine STRAW (A) YELLOW   APPearance CLEAR CLEAR   Specific Gravity, Urine 1.005 1.005 - 1.030   pH 7.0 5.0 - 8.0   Glucose, UA NEGATIVE NEGATIVE mg/dL   Hgb urine dipstick SMALL (A) NEGATIVE   Bilirubin Urine NEGATIVE NEGATIVE   Ketones, ur NEGATIVE NEGATIVE mg/dL   Protein, ur NEGATIVE NEGATIVE mg/dL   Nitrite NEGATIVE NEGATIVE   Leukocytes,Ua MODERATE (A) NEGATIVE   RBC / HPF 0-5 0 - 5 RBC/hpf   WBC, UA 6-10 0 - 5 WBC/hpf   Bacteria, UA NONE SEEN NONE SEEN   Squamous Epithelial / LPF 0-5 0 - 5    Comment: Performed at [facility], [place] [address], [place], [place] [address]  Lactic acid, plasma     Status: None   Collection Time:  09/19/20  7:07 PM  Result Value Ref Range   Lactic Acid, Venous 1.8 0.5 - 1.9 mmol/L    Comment: Performed at [facility] [address], [place], [place] [address]  Comprehensive metabolic panel     Status: Abnormal   Collection Time: 09/20/20  1:24 AM  Result Value Ref Range   Sodium 139 135 - 145 mmol/L   Potassium 3.8 3.5 - 5.1 mmol/L   Chloride 107 98 - 111 mmol/L   CO2 23 22 - 32 mmol/L   Glucose, Bld 107 (H) 70 - 99 mg/dL    Comment: Glucose reference range applies only to samples taken after fasting for at least 8 hours.   BUN 13 8 - 23 mg/dL   Creatinine, Ser 1.64 (H) 0.44 - 1.00 mg/dL   Calcium 8.2 (L) 8.9 - 10.3 mg/dL   Total Protein 6.1 (L) 6.5 - 8.1 g/dL   Albumin 2.6 (L) 3.5 - 5.0 g/dL   AST 21 15 - 41 U/L   ALT 12 0 - 44 U/L   Alkaline Phosphatase 49 38 - 126 U/L   Total Bilirubin 0.4 0.3 - 1.2 mg/dL   GFR, Estimated 32 (L) >60 mL/min    Comment: (NOTE) Calculated using the CKD-EPI Creatinine Equation (2021)    Anion gap 9 5 - 15    Comment: Performed at [facility], [place] [address], [place], [place] [address]  CBC     Status: Abnormal   Collection Time: 09/20/20  1:24 AM  Result Value Ref Range   WBC 4.3 4.0 - 10.5 K/uL   RBC 3.35 (L) 3.87 - 5.11 MIL/uL   Hemoglobin 10.3 (L) 12.0 - 15.0 g/dL   HCT 32.3 (L) 36 - 46 %   MCV 96.4 80.0 - 100.0 fL   MCH 30.7 26.0 - 34.0 pg   MCHC 31.9 30.0 - 36.0 g/dL   RDW 16.4 (H) 11.5 - 15.5 %   Platelets 122 (L) 150 - 400 K/uL    Comment: REPEATED TO VERIFY   nRBC 0.0 0.0 - 0.2 %    Comment: Performed at [facility], [place] [address], [place],  [address]  CBC     Status: Abnormal   Collection Time: 09/21/20  1:42 AM  Result Value Ref Range   WBC 4.5  4.0 - 10.5 K/uL   RBC 3.33 (L) 3.87 - 5.11 MIL/uL   Hemoglobin 10.2 (L) 12.0 - 15.0 g/dL   HCT 32.8 (L) 36 - 46 %   MCV 98.5 80.0 - 100.0 fL   MCH 30.6 26.0 - 34.0 pg   MCHC 31.1 30.0 - 36.0 g/dL   RDW 16.2 (H) 11.5 - 15.5 %   Platelets 117 (L)  150 - 400 K/uL    Comment: REPEATED TO VERIFY PLATELET COUNT CONFIRMED BY SMEAR SPECIMEN CHECKED FOR CLOTS Immature Platelet Fraction may be clinically indicated, consider ordering this additional test [id number]    nRBC 0.0 0.0 - 0.2 %    Comment: Performed at [facility], [place] [address], [place], [place] [address]  Comprehensive metabolic panel     Status: Abnormal   Collection Time: 09/21/20  1:42 AM  Result Value Ref Range   Sodium 139 135 - 145 mmol/L   Potassium 3.7 3.5 - 5.1 mmol/L   Chloride 105 98 - 111 mmol/L   CO2 25 22 - 32 mmol/L   Glucose, Bld 105 (H) 70 - 99 mg/dL    Comment: Glucose reference range applies only to samples taken after fasting for at least 8 hours.   BUN 13 8 - 23 mg/dL   Creatinine, Ser 1.56 (H) 0.44 - 1.00 mg/dL   Calcium 8.0 (L) 8.9 - 10.3 mg/dL   Total Protein 6.1 (L) 6.5 - 8.1 g/dL   Albumin 2.5 (L) 3.5 - 5.0 g/dL   AST 34 15 - 41 U/L   ALT 20 0 - 44 U/L   Alkaline Phosphatase 49 38 - 126 U/L   Total Bilirubin 0.4 0.3 - 1.2 mg/dL   GFR, Estimated 34 (L) >60 mL/min    Comment: (NOTE) Calculated using the CKD-EPI Creatinine Equation (2021)    Anion gap 9 5 - 15    Comment: Performed at [facility], [place] [address], [place], [place] [address]  Magnesium     Status: None   Collection Time: 09/21/20  1:42 AM  Result Value Ref Range   Magnesium 1.8 1.7 - 2.4 mg/dL    Comment: Performed at [facility] [address] [place], [place] [address]  Phosphorus     Status: None   Collection Time: 09/21/20  1:42 AM  Result Value Ref Range   Phosphorus 3.5 2.5 - 4.6 mg/dL    Comment: Performed at [facility] [address] [place],  [address]   DG Chest Port 1 View  Result Date: 09/19/2020 CLINICAL DATA:  Questionable sepsis. EXAM: PORTABLE CHEST 1 VIEW COMPARISON:  Chest x-ray 09/18/2020, 07/15/2020.  CT 05/23/2020. FINDINGS: Left tracheal shift possibly related to goiter again noted. No interim change. Cardiac  pacer stable position. Stable cardiomegaly. Mild pulmonary venous congestion. Mild left base atelectasis/infiltrate cannot be excluded. No pleural effusion or pneumothorax. IMPRESSION: 1. Stable cardiomegaly. Mild pulmonary venous congestion. 2. Mild left base atelectasis/infiltrate cannot be excluded. 3. Left tracheal shift related to goiter again noted. No interim change. Electronically Signed   By: Marcello Moores  Register   On: 09/19/2020 14:57    Review Of Systems Constitutional: No fever, chills, Chronic weight gain. Eyes: No vision change, wears glasses. No discharge or pain. Ears: No hearing loss, No tinnitus. Respiratory: Positive asthma, COPD, pneumonias, shortness of breath. No hemoptysis. Cardiovascular: Positive chest pain, palpitation, leg edema. Gastrointestinal: No nausea, vomiting, diarrhea, constipation. No GI bleed. No hepatitis. Genitourinary: No dysuria, hematuria, kidney  stone. No incontinance. Neurological: Positive headache, stroke, no seizures.  Psychiatry: No psych facility admission for anxiety, depression, suicide. No detox. Skin: No rash. Musculoskeletal: Positive joint pain, fibromyalgia, neck pain, back pain. Lymphadenopathy: No lymphadenopathy. Hematology: No anemia or easy bruising.   Blood pressure 126/71, pulse 75, temperature 98.7 F (37.1 C), temperature source Oral, resp. rate 20, height 5\' 7"  (1.702 m), weight (!) 150.1 kg, SpO2 96 %. Body mass index is 51.84 kg/m. General appearance: alert, cooperative, appears stated age and no distress Head: Normocephalic, atraumatic. Eyes: Brown eyes, pink conjunctiva, corneas clear. PERRL, EOM's intact. Neck: No adenopathy, no carotid bruit, no JVD, supple, symmetrical, trachea midline and thyroid not enlarged. Resp: Clearing to auscultation bilaterally. Cardio: Regular rate and rhythm, S1, S2 normal, II/VI systolic murmur, no click, rub or gallop GI: Soft, non-tender; bowel sounds normal; no organomegaly. Extremities:  2 + edema, cyanosis or clubbing. Bilateral lower leg venous stasis dermatitis. Skin: Warm and dry.  Neurologic: Alert and oriented X 2, normal strength.   Assessment/Plan Acute urosepsis E.Coli bacteremia Acute on chronic diastolic left heart failure, HFpEF Paroxysmal atrial fibrillation, CHA2DS2VASc score of 7 HTN Morbid obesity Hyperlipidemia S/P stroke S/P pacemaker placement  Increase activity. Continue medical treatment.  Time spent: Review of old records, Lab, x-rays, EKG, other cardiac tests, examination, discussion with patient/Nurse over 70 minutes.  Birdie Riddle, MD  09/21/2020, 11:12 AM

## 2020-09-22 DIAGNOSIS — A419 Sepsis, unspecified organism: Secondary | ICD-10-CM

## 2020-09-22 DIAGNOSIS — I4892 Unspecified atrial flutter: Secondary | ICD-10-CM

## 2020-09-22 LAB — COMPREHENSIVE METABOLIC PANEL
ALT: 17 U/L (ref 0–44)
AST: 30 U/L (ref 15–41)
Albumin: 2.4 g/dL — ABNORMAL LOW (ref 3.5–5.0)
Alkaline Phosphatase: 50 U/L (ref 38–126)
Anion gap: 8 (ref 5–15)
BUN: 12 mg/dL (ref 8–23)
CO2: 28 mmol/L (ref 22–32)
Calcium: 8.3 mg/dL — ABNORMAL LOW (ref 8.9–10.3)
Chloride: 104 mmol/L (ref 98–111)
Creatinine, Ser: 1.5 mg/dL — ABNORMAL HIGH (ref 0.44–1.00)
GFR, Estimated: 36 mL/min — ABNORMAL LOW (ref >60)
Glucose, Bld: 97 mg/dL (ref 70–99)
Potassium: 3.4 mmol/L — ABNORMAL LOW (ref 3.5–5.1)
Sodium: 140 mmol/L (ref 135–145)
Total Bilirubin: 0.6 mg/dL (ref 0.3–1.2)
Total Protein: 6.4 g/dL — ABNORMAL LOW (ref 6.5–8.1)

## 2020-09-22 LAB — CBC
HCT: 33.9 % — ABNORMAL LOW (ref 36.0–46.0)
Hemoglobin: 10.5 g/dL — ABNORMAL LOW (ref 12.0–15.0)
MCH: 30.4 pg (ref 26.0–34.0)
MCHC: 31 g/dL (ref 30.0–36.0)
MCV: 98.3 fL (ref 80.0–100.0)
Platelets: 132 10*3/uL — ABNORMAL LOW (ref 150–400)
RBC: 3.45 MIL/uL — ABNORMAL LOW (ref 3.87–5.11)
RDW: 16.1 % — ABNORMAL HIGH (ref 11.5–15.5)
WBC: 3.7 10*3/uL — ABNORMAL LOW (ref 4.0–10.5)
nRBC: 0 % (ref 0.0–0.2)

## 2020-09-22 MED ORDER — FUROSEMIDE 10 MG/ML IJ SOLN: 40.0000 mg | Freq: Two times a day (BID) | INTRAMUSCULAR | Status: DC

## 2020-09-22 MED ORDER — POTASSIUM CHLORIDE 20 MEQ PO PACK
20.0000 meq | PACK | Freq: Two times a day (BID) | ORAL | Status: AC
  Administered 2020-09-22 (×2): 20 meq via ORAL
  Filled 2020-09-22 (×2): qty 1

## 2020-09-22 MED ORDER — FUROSEMIDE 10 MG/ML IJ SOLN
40.0000 mg | Freq: Two times a day (BID) | INTRAMUSCULAR | Status: DC
  Administered 2020-09-22 (×2): 40 mg via INTRAVENOUS
  Administered 2020-09-23: 20 mg via INTRAVENOUS
  Filled 2020-09-22 (×3): qty 4

## 2020-09-22 MED ORDER — AMLODIPINE BESYLATE 5 MG PO TABS
5.0000 mg | ORAL_TABLET | Freq: Every day | ORAL | Status: DC
  Administered 2020-09-22 – 2020-09-24 (×3): 5 mg via ORAL
  Filled 2020-09-22 (×3): qty 1

## 2020-09-22 MED ORDER — CEFDINIR 300 MG PO CAPS
300.0000 mg | ORAL_CAPSULE | Freq: Two times a day (BID) | ORAL | Status: DC
  Administered 2020-09-22 – 2020-09-24 (×6): 300 mg via ORAL
  Filled 2020-09-22 (×7): qty 1

## 2020-09-22 NOTE — Progress Notes (Signed)
   Subjective:  [name] was evaluated at bedside this morning with her daughter at bedside. Patient endorses that she feels better this morning. She did not get much sleep but still feeling alright overall. She was able to work with physical therapy yesterday. She notes that she will work with them again today to help her sit up at the side of the bed. Daughter believes that CIR may be too aggressive for her and she may not be able to tolerate.  Objective:  Vital signs in last 24 hours: Vitals:   09/21/20 1354 09/21/20 1912 09/21/20 2133 09/22/20 0442  BP: 139/71 114/70 112/62 (!) 153/73  Pulse: 72 (!) 108 69 61  Resp: 20  19 18   Temp: 98.9 F (37.2 C)  99 F (37.2 C) 98 F (36.7 C)  TempSrc: Oral  Oral Oral  SpO2: 97%  99% 97%  Weight:      Height:       Physical Exam: General: Pleasant, laying in bed, no acute distress Pulm: Bibasilar crackles present. No use of accessory muscles. MSK: Venous stasis dermatitis present bilateral lower extremities. No open wounds appreciated. Neruo: Awake, alert, oriented x4. Moving extremities appropriately.  Assessment/Plan: [name] is 79yo female (she/her) with HFpEF (last TTE 07/19/20), coronary artery disease, sick sinus syndrome s/p pacemaker, paroxysmal atrial fibrillation, hypertension, chronic kidney disease stage IIIb, hyperlipidemia admitted 11/26 for E. coli bacteremia, continuing to diurese 2/2 hypervolemia.  Principal Problem:   Bacteremia due to Escherichia coli Active Problems:   Morbid obesity (HCC)   Paroxysmal atrial fibrillation ([facility])   Pressure injury of skin   Acute on chronic heart failure with preserved ejection fraction (HFpEF) ([place])  #Bacteremia 2/2 E. coli infection Patient continues to be afebrile, hemodynamically stable. No leukocytosis present. BCx revealed pan-susceptible E. Coli. Given she has remained stable, will transition to oral antibiotics with plans for a total of 7 days. PT recommending CIR  at this time. - Start cefdinir 300mg  q12 (day 5/7) - Trend CBC - CIR consult  #Acute on chronic diastolic heart failure Over last 24 hours, patient has had appropriate UOP. Attempted O2 wean today, but continues to require 3L. Given she continues to have rales on exam, will increase frequency of lasix. Will continue to monitor daily. - IV lasix 40mg  BID - Strict I&O's - Trend BMP  #AKI on CKD IIIb sCr 1.50 < 1.56, good urine output s/p lasix. Expect renal function to continue improving with diuresis. - Trend RFP - Strict I&O's - Avoid nephrotoxic medications  Chronic: #Hypertension #Paroxysmal atrial fibrillation - Hold home antihypertensives in setting of infection, acute heart failure exacerbation - CHADS-VASc 7, EKG remains unchanged - C/w Eliquis 5mg  BID, amiodarone 200mg  qd  DIET: HH IVF: n/a DVT PPX: home eliquis BOWEL: Senokot-S, Miralax CODE: FULL FAM COM: Daughter at bedside this AM. Discussed patient's clinical course and today's plan. She verbalizes understanding.  Prior to Admission Living Arrangement: Home Anticipated Discharge Location: CIR Barriers to Discharge: medical management Dispo: Anticipated discharge in approximately 1-2 day(s).   Sanjuan Dame, MD 09/22/2020, 7:16 AM Pager: [phone] After 5pm on weekdays and 1pm on weekends: On Call pager [phone]

## 2020-09-22 NOTE — Progress Notes (Addendum)
Pt nose bleeding for left nare, pt stated earlier today that it has bled a little off and on, none seen by RN today (pt has been on O2, it was removed about an hour and a half ago. Bleed mod to large amount. Pressure held and doctor on call for [facility] internal medicine paged and informed of bleed and interventions attempted already.  (pager [phone]).  Pt is on Eliquis.  HOB declined back and pressure held, pt still bleeding, draining down throat, pt coughing it up due to it gagging her.  HOB raised back up some, and pressure continues to be held by pt and her daughter.  MD stated Will be up to see pt or place orders in chart.

## 2020-09-22 NOTE — Progress Notes (Signed)
Dr. Alexandria Lodge called and updated that pt's nose bleed has stopped at present. Advised to hold pm dose of Eliquis. Explained to pt at bedside. Will continue to monitor.

## 2020-09-22 NOTE — Progress Notes (Signed)
Dr Alexandria Lodge called at approx 1950 to see how pt was doing, pt continuing to have bleeding from nose. Some clotting noted, as noted by stringy clots coming out of nose or pt coughing up.  Daughter has been helping to hold pressure, states bleeding same. Dr Shon Baton advised to hold the end of the nose (anterior side) with pressure for 10 minutes without letting up any pressure.    MD advised to call her back if needed or bleed doesn't stop, on coming [facility] [place] made aware.

## 2020-09-22 NOTE — Discharge Instructions (Signed)

## 2020-09-22 NOTE — Progress Notes (Signed)
Rehab Admissions Coordinator Note:  Per PT and OT recommendation, this patient was screened by Raechel Ache for appropriateness for an Inpatient Acute Rehab Consult.  At this time, we are recommending Inpatient Rehab consult. AC will contact MD to request order.   Raechel Ache 09/22/2020, 12:15 PM  I can be reached at [phone].

## 2020-09-23 LAB — COMPREHENSIVE METABOLIC PANEL
ALT: 15 U/L (ref 0–44)
AST: 36 U/L (ref 15–41)
Albumin: 2.5 g/dL — ABNORMAL LOW (ref 3.5–5.0)
Alkaline Phosphatase: 58 U/L (ref 38–126)
Anion gap: 12 (ref 5–15)
BUN: 14 mg/dL (ref 8–23)
CO2: 28 mmol/L (ref 22–32)
Calcium: 8.7 mg/dL — ABNORMAL LOW (ref 8.9–10.3)
Chloride: 103 mmol/L (ref 98–111)
Creatinine, Ser: 1.51 mg/dL — ABNORMAL HIGH (ref 0.44–1.00)
GFR, Estimated: 36 mL/min — ABNORMAL LOW (ref >60)
Glucose, Bld: 126 mg/dL — ABNORMAL HIGH (ref 70–99)
Potassium: 3.6 mmol/L (ref 3.5–5.1)
Sodium: 143 mmol/L (ref 135–145)
Total Bilirubin: 0.8 mg/dL (ref 0.3–1.2)
Total Protein: 6.6 g/dL (ref 6.5–8.1)

## 2020-09-23 LAB — CBC
HCT: 36.2 % (ref 36.0–46.0)
Hemoglobin: 11.5 g/dL — ABNORMAL LOW (ref 12.0–15.0)
MCH: 30.5 pg (ref 26.0–34.0)
MCHC: 31.8 g/dL (ref 30.0–36.0)
MCV: 96 fL (ref 80.0–100.0)
Platelets: 173 10*3/uL (ref 150–400)
RBC: 3.77 MIL/uL — ABNORMAL LOW (ref 3.87–5.11)
RDW: 15.9 % — ABNORMAL HIGH (ref 11.5–15.5)
WBC: 3.5 10*3/uL — ABNORMAL LOW (ref 4.0–10.5)
nRBC: 0 % (ref 0.0–0.2)

## 2020-09-23 LAB — MAGNESIUM: Magnesium: 1.9 mg/dL (ref 1.7–2.4)

## 2020-09-23 LAB — BASIC METABOLIC PANEL
Anion gap: 8 (ref 5–15)
BUN: 14 mg/dL (ref 8–23)
CO2: 30 mmol/L (ref 22–32)
Calcium: 8.7 mg/dL — ABNORMAL LOW (ref 8.9–10.3)
Chloride: 103 mmol/L (ref 98–111)
Creatinine, Ser: 1.46 mg/dL — ABNORMAL HIGH (ref 0.44–1.00)
GFR, Estimated: 37 mL/min — ABNORMAL LOW (ref >60)
Glucose, Bld: 117 mg/dL — ABNORMAL HIGH (ref 70–99)
Potassium: 3.9 mmol/L (ref 3.5–5.1)
Sodium: 141 mmol/L (ref 135–145)

## 2020-09-23 MED ORDER — FUROSEMIDE 40 MG PO TABS
40.0000 mg | ORAL_TABLET | Freq: Every day | ORAL | Status: DC
  Filled 2020-09-23: qty 1

## 2020-09-23 MED ORDER — POTASSIUM CHLORIDE CRYS ER 20 MEQ PO TBCR
40.0000 meq | EXTENDED_RELEASE_TABLET | Freq: Once | ORAL | Status: AC
  Administered 2020-09-23: 40 meq via ORAL
  Filled 2020-09-23: qty 2

## 2020-09-23 NOTE — Progress Notes (Signed)
STAT 12 Lead EKG completed as ordered. Preliminary results uploaded and pending review. Confirmed results with Central Telemetry; informed pt switched back to SR with AV Block around 0400. Will continue to monitor.

## 2020-09-23 NOTE — Progress Notes (Signed)
Subjective: [name] evaluated at bedside this AM. States she feels better today after episode of palpitations and nose bleed yesterday. She endorses improvement in dyspnea, no longer requiring supplemental O2. Overall feels "wiped out" after diuresis yesterday. Was not able to work with PT yesterday, would like to be able to today. Daughter and patient prefer patient is discharged home w/ HH. Discussed risks and benefits with both. Daughter reports she will be staying with the patient at home and can help out with her daily ADL's.  Objective:  Vital signs in last 24 hours: Vitals:   09/22/20 1439 09/22/20 1900 09/22/20 2141 09/23/20 0338  BP: 133/78  137/76 111/76  Pulse: 64  70 75  Resp: 20  16 18   Temp: 98 F (36.7 C)  98.4 F (36.9 C) 98.5 F (36.9 C)  TempSrc: Oral  Oral Oral  SpO2: 93% 94% 90% 92%  Weight:      Height:       Physical Exam: General: Morbidly obese female, no acute distress CV: Regular rate, rhythm. No m/r/g appreciated Pulm: Bibasilar rales. No use of accessory muscles Neuro: Awake, alert, oriented x4. Moving extremities appropriately.  Assessment/Plan: [name] is 79yo female (she/her) with HFpEF (last TTE 07/19/20), coronary artery disease, sick sinus syndrome s/p pacemaker, paroxysmal atrial fibrillation, hypertension, chronic kidney disease staage IIIb, hyperlipidemia admitted 11/26 for E. coli bacteremia, found to be hypervolemic 2/2 IVF, good UOP with diuresis.   Principal Problem:   Bacteremia due to Escherichia coli Active Problems:   Morbid obesity (HCC)   Paroxysmal atrial fibrillation (HCC)   Pressure injury of skin   Acute on chronic heart failure with preserved ejection fraction (HFpEF) ([place])  #Bacteremia 2/2 E. coli infection Patient afebrile, hemodynamically stable. She did have episode of atrial flutter overnight, which resolved spontaneously. No chest pain or dizziness. Patient will continue with cefdinir for 7 days total.  Discussed disposition with patient and daughter today, who both prefer Home Health. Daughter mentions she will be able to care for patient 24/7 for at least the next month. - C/w cefdinir 300 q12 (day 6/7) - Trend CBC - PT/OT - Appreciate CSW assistance in setting up [facility]  #Acute on chronic diastolic heart failure Patient this AM no longer requiring supplemental O2, denies dyspnea. Patient still has bibasilar rales, although much improved from yesterday. Received 20mg  IV lasix this AM, will hold further diuresis today. Can re-assess tomorrow AM. - Hold further diuresis - Strict I&O's - Trend BMP  #AKI on CKD IIIb sCr stable, 1.51 < 1.50 (BL 1.4 in August) s/p lasix. >5L UOP yesterday with total 80mg  IV lasix. Holding further diuresis after AM dose, will re-assess tomorrow morning for further need. Possibly patient's new baseline Cr. - Trend RFP - Avoid nephrotoxic medications  #Epistaxis Patient experienced epistaxis last night, which resolved with pressure. Likely 2/2 dryness related to supplemental oxygen complicated by anticoagulation. PM Eliquis dose held, will re-start eliquis today. - Re-start eliquis 5mg  BID  #Atrial flutter #Paroxysmal atrial fibrillation Patient had episode of atrial flutter overnight. Denies chest pain, dyspnea, dizziness. EKG at the time without new changes. Will re-start Eliquis today and continue with amiodarone. - C/w eliquis bid, amiodarone 20mg  qd  DIET: HH IVF: n/a DVT PPX:  BOWEL: Miralax, Senokot-S CODE: FULL FAM COM: Discussed patient's clinical case with daughter at bedside this AM.  Prior to Admission Living Arrangement: Home Anticipated Discharge Location: Home w/ [facility] Barriers to Discharge: medical management Dispo: Anticipated discharge in approximately 1-2 day(s).  Sanjuan Dame, MD 09/23/2020, 6:17 AM Pager: [phone] After 5pm on weekdays and 1pm on weekends: On Call pager [phone]

## 2020-09-23 NOTE — Progress Notes (Signed)
Call received from [facility] Tele[facility] stating that pt went into A flutter this am around 0200. Pt reports feeling fluttering sensation at this time, denies chest pain, not SOB or in distress.  VSS. Dr. Shon Baton made aware of rhythm change and member status. Pt educated at bedside. Will continue to monitor.

## 2020-09-23 NOTE — Progress Notes (Signed)
Physical Therapy Treatment Patient Details Name: [name] MRN: [id number] DOB: 1941/07/26 Today's Date: 09/23/2020    History of Present Illness 79 year old woman admitted with bacteremia and sepsis due to E Coli UTI.  PMH significant for  HFpEF, CAD, sick sinus syndrome with a pacemaker, CKD, HTN.    PT Comments    Continuing work on functional mobility and activity tolerance;  Session focused on initial progressive amb and transfer training, in light of pt and daughter decision ot dc home; Continues to require Mod assist for sit to stand transfers (tells me she has a lift chair at home); Lots of encouragement to walk -- able to walk to the door of the room with chair push behind for safety;   Noted HR incr to 135bpm with activity; once she sat down, her HR persisted in the 120s for approx 10 minutes (while we were discussing plans and goals to work on for dc home) before her HR settled back into 70s; Pt's daughter voiced concern about her HR, and will likely purchase a pulse ox for home use;   Lengthy discussion of level of assist needed at home, recommended epuipment, and what to anticipate from [facility] services; pt's daughter plans to stay with her mom and be able to prvide around the clock assist until the first of the year.   Follow Up Recommendations  Home health PT;Supervision/Assistance - 24 hour (24 hour up to heavy mod assist; At this moment in time, I still favor post-acute rehab to maximize independence and safety with mobility; however with discussion, it sounds like pt and daughter are realistic with what is needed to go home, and they choose home with [facility] services)     Equipment Recommendations  Wheelchair (measurements PT);Wheelchair cushion (measurements PT);Rolling walker with 5" wheels;3in1 (PT) Judie Petit)    Recommendations for Other Services       Precautions / Restrictions Precautions Precautions: Fall    Mobility  Bed Mobility Overal bed mobility: Needs  Assistance Bed Mobility: Supine to Sit     Supine to sit: Min assist;+2 for physical assistance;+2 for safety/equipment     General bed mobility comments: assist to initate legs and pull up trunk into upright sitting from bariatric air mattress  Transfers Overall transfer level: Needs assistance Equipment used: Rolling walker (2 wheeled) Transfers: Sit to/from Stand Sit to Stand: Mod assist;+2 physical assistance;+2 safety/equipment         General transfer comment: assist to rise and steady with extreme forward flexion prior to coming up to full stand  Ambulation/Gait Ambulation/Gait assistance: Min assist;+2 safety/equipment Gait Distance (Feet): 25 Feet Assistive device: Rolling walker (2 wheeled) (and chair push behind) Gait Pattern/deviations: Step-through pattern;Decreased step length - right;Decreased step length - left;Trunk flexed     General Gait Details: Heavy dependence on RW; tending to keep trunk flexed   Stairs             Wheelchair Mobility    Modified Rankin (Stroke Patients Only)       Balance     Sitting balance-Leahy Scale: Good       Standing balance-Leahy Scale: Poor                              Cognition Arousal/Alertness: Awake/alert Behavior During Therapy: WFL for tasks assessed/performed Overall Cognitive Status: Impaired/Different from baseline  General Comments: increased time and cues to process basic task mobility tasks      Exercises      General Comments General comments (skin integrity, edema, etc.): Lengthy discussion re: managing at home with pt and her daughter      Pertinent Vitals/Pain Pain Assessment: Faces Faces Pain Scale: Hurts a little bit Pain Location: back; bil knees with WBing Pain Descriptors / Indicators: Aching;Sore Pain Intervention(s): Limited activity within patient's tolerance    Home Living                      Prior  Function            PT Goals (current goals can now be found in the care plan section) Acute Rehab PT Goals Patient Stated Goal: return to independence PT Goal Formulation: With patient Time For Goal Achievement: 10/05/20 Potential to Achieve Goals: Good Progress towards PT goals: Progressing toward goals    Frequency    Min 4X/week      PT Plan Discharge plan needs to be updated;Frequency needs to be updated    Co-evaluation              AM-PAC PT "6 Clicks" Mobility   Outcome Measure  Help needed turning from your back to your side while in a flat bed without using bedrails?: A Little Help needed moving from lying on your back to sitting on the side of a flat bed without using bedrails?: A Little Help needed moving to and from a bed to a chair (including a wheelchair)?: A Lot Help needed standing up from a chair using your arms (e.g., wheelchair or bedside chair)?: A Lot Help needed to walk in hospital room?: A Lot Help needed climbing 3-5 steps with a railing? : A Lot 6 Click Score: 14    End of Session Equipment Utilized During Treatment: Gait belt (rolled sheet as gait belt) Activity Tolerance: Patient tolerated treatment well Patient left: in chair;with call bell/phone within reach;with family/visitor present Nurse Communication: Mobility status PT Visit Diagnosis: Unsteadiness on feet (R26.81);Other abnormalities of gait and mobility (R26.89);Muscle weakness (generalized) (M62.81);Difficulty in walking, not elsewhere classified (R26.2)     Time: 1343-1430 PT Time Calculation (min) (ACUTE ONLY): 47 min  Charges:  $Gait Training: 8-22 mins $Therapeutic Activity: 8-22 mins $Self Care/Home Management: [place], PT  Acute Rehabilitation Services Pager [phone] Office [place] 09/23/2020, 4:09 PM

## 2020-09-23 NOTE — Plan of Care (Signed)
°  Problem: Education: Goal: Knowledge of General Education information will improve Description: Including pain rating scale, medication(s)/side effects and non-pharmacologic comfort measures Outcome: Progressing   Problem: Health Behavior/Discharge Planning: Goal: Ability to manage health-related needs will improve Outcome: Progressing   Problem: Clinical Measurements: Goal: Ability to maintain clinical measurements within normal limits will improve Outcome: Progressing Goal: Will remain free from infection Outcome: Progressing Goal: Diagnostic test results will improve Outcome: Progressing Goal: Respiratory complications will improve Outcome: Progressing Goal: Cardiovascular complication will be avoided Outcome: Progressing   Problem: Activity: Goal: Risk for activity intolerance will decrease Outcome: Progressing   Problem: Nutrition: Goal: Adequate nutrition will be maintained Outcome: Progressing   Problem: Elimination: Goal: Will not experience complications related to bowel motility Outcome: Progressing Goal: Will not experience complications related to urinary retention Outcome: Progressing   Problem: Safety: Goal: Ability to remain free from injury will improve Outcome: Progressing   Problem: Skin Integrity: Goal: Risk for impaired skin integrity will decrease Outcome: Progressing   Problem: Activity: Goal: Ability to tolerate increased activity will improve Outcome: Progressing   Problem: Cardiac: Goal: Ability to achieve and maintain adequate cardiopulmonary perfusion will improve Outcome: Progressing   Problem: Health Behavior/Discharge Planning: Goal: Ability to safely manage health-related needs after discharge will improve Outcome: Progressing

## 2020-09-23 NOTE — Progress Notes (Signed)
Inpatient Rehab Admissions:  Inpatient Rehab Consult received.  I spoke with patient's daughter over the phone for rehabilitation assessment and to discuss goals and expectations of an inpatient rehab admission.  At this time, they are preferring to discharge home with home health therapy.  I will let TOC team know and sign off for CIR at this time.   Signed: Shann Medal, PT, DPT Admissions Coordinator [phone] 09/23/20  12:46 PM

## 2020-09-23 NOTE — TOC Initial Note (Addendum)
Transition of Care [facility]) - Initial/Assessment Note    Patient Details  Name: [name] MRN: [id number] Date of Birth: [birth year]  Transition of [facility]) CM/SW Contact:    Marilu Favre, RN Phone Number: 09/23/2020, 1:56 PM  Clinical Narrative:                 Spoke to patient and daughter Rolan Lipa May at bedside.   Disscussed CIR, SNF and home health. Patient prefers to go home with home health. Daughter in agreement.   Patient already has a hospital bed , rolator at home.   Patient requesting trapeze bar for bed at home. Also recommended 3 in 1, rolling walker, and wheel chair.   NCM will order through [place], Johnnie May is contact person for payment and delivery, confirmed her cell. Explained [employer] will discuss payment. Insurance does not cover walker and wheel chair together etc.   Patient will need PTAR transport home. Address confirmed.   Provided choice for home health agency , they prefer [facility] . Santiago Glad at  [place] accepted referral. Will need to fax H and P , progress note and orders to Santiago Glad at [phone] . Fax sent   Expected Discharge Plan: National Barriers to Discharge: Continued Medical Work up   Patient Goals and CMS Choice Patient states their goals for this hospitalization and ongoing recovery are:: to return home CMS Medicare.gov Compare Post Acute Care list provided to:: Patient Choice offered to / list presented to : Patient, Adult Children  Expected Discharge Plan and Services Expected Discharge Plan: [place]   Discharge Planning Services: CM Consult Post Acute Care Choice: Home Health, Durable Medical Equipment Living arrangements for the past 2 months: [place]                 DME Arranged: 3-N-1, Trapeze, Walker rolling, Wheelchair manual DME Agency: [employer] Date DME Agency Contacted: 09/23/20 Time DME Agency Contacted: [id number] Representative spoke with at DME Agency:  Freda Munro HH Arranged: PT, OT, RN [place] Agency: [place] Date [place]: 09/23/20 Time [place]: 19 Representative spoke with at [place]: Santiago Glad  Prior Living Arrangements/Services Living arrangements for the past 2 months: Single Family Home Lives with:: Adult Children Patient language and need for interpreter reviewed:: Yes        Need for Family Participation in Patient Care: Yes (Comment) Care giver support system in place?: Yes (comment) Current home services: DME Criminal Activity/Legal Involvement Pertinent to Current Situation/Hospitalization: No - Comment as needed  Activities of Daily Living Home Assistive Devices/Equipment: Environmental consultant (specify type) ADL Screening (condition at time of admission) Patient's cognitive ability adequate to safely complete daily activities?: Yes Is the patient deaf or have difficulty hearing?: No Does the patient have difficulty seeing, even when wearing glasses/contacts?: No Does the patient have difficulty concentrating, remembering, or making decisions?: No Patient able to express need for assistance with ADLs?: Yes Does the patient have difficulty dressing or bathing?: Yes Independently performs ADLs?: No Communication: Independent Dressing (OT): Dependent Is this a change from baseline?: Pre-admission baseline Grooming: Needs assistance Feeding: Independent Does the patient have difficulty walking or climbing stairs?: Yes Weakness of Legs: Both Weakness of Arms/Hands: None  Permission Sought/Granted   Permission granted to share information with : Yes, Verbal Permission Granted  Share Information with NAME: daughter Johnnie May [phone]  Permission granted to share info w AGENCY: [facility]  Permission granted to share info w Relationship: daughter     Emotional Assessment Appearance:: Appears stated age Attitude/Demeanor/Rapport: Engaged Affect (typically observed): Accepting Orientation: :  Oriented to Self, Oriented to Place, Oriented to  Time, Oriented to Situation Alcohol / Substance Use: Not Applicable Psych Involvement: No (comment)  Admission diagnosis:  Bacteremia due to Escherichia coli [R78.81, B96.20] Sepsis, due to unspecified organism, unspecified whether acute organ dysfunction present [facility]) [A41.9] Patient Active Problem List   Diagnosis Date Noted  . Pressure injury of skin 09/20/2020  . Acute on chronic heart failure with preserved ejection fraction (HFpEF) ([place]) 09/20/2020  . Bacteremia due to Escherichia coli 09/19/2020  . Cellulitis of right leg 07/25/2020  . NSTEMI (non-ST elevated myocardial infarction) ([place]) 07/16/2020  . Chronic diastolic CHF (congestive heart failure) ([place]) 07/16/2020  . Paroxysmal atrial fibrillation ([place]) 07/16/2020  . Hypotension 07/16/2020  . Shortness of breath at rest 05/22/2020    Class: Acute  . Palpitation 05/22/2020    Class: Acute  . Shortness of breath 05/22/2020  . Acute left systolic heart failure ([place]) 06/18/2019  . Acute systolic heart failure ([place]) 06/18/2019  . 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 Symptomatic sinus bradycardia 03/27/2014  . Pacemaker at end of battery life 03/27/2014  . CVA (cerebral infarction) 10/17/2012  . Headache(784.0) 10/17/2012  . Coronary artery disease   . Hypertension   . Hyperlipemia   . Arthritis   . CHF (congestive heart failure) ([place])   . Vertigo   . TIA (transient ischemic attack)   . Pacemaker   . MI (myocardial infarction) ([place])   . Morbid obesity ([place])    PCP:  Rocky Morel, MD Pharmacy:   [facility], [place] [address] Phone: [phone] Fax: [phone]  [id number] - [place], [place] - [address] AT [place] [address] [place] [address] Phone: [phone] Fax: [phone]     Social Determinants of Health (SDOH)  Interventions    Readmission Risk Interventions Readmission Risk Prevention Plan 05/28/2020  Transportation Screening Complete  PCP or Specialist Appt within 3-5 Days Complete  HRI or [place] Complete  Social Work Consult for [place] Planning/Counseling Complete  Palliative Care Screening Not Applicable  Medication Review Press photographer) Complete  Some recent data might be hidden

## 2020-09-23 NOTE — Care Management (Signed)
    Durable Medical Equipment  (From admission, onward)         Start     Ordered   09/23/20 1349  For home use only DME standard manual wheelchair with seat cushion  Once       Comments: Patient suffers from Bacteremia due to Escherichia coli, CHF  which impairs their ability to perform daily activities like ADL's and ambulation  in the home.  A walker  will not resolve issue with performing activities of daily living. A wheelchair will allow patient to safely perform daily activities. Patient can safely propel the wheelchair in the home or has a caregiver who can provide assistance. Length of need lifetime . Accessories: elevating leg rests (ELRs), wheel locks, extensions and anti-tippers.  Seat and back cushions   09/23/20 1349   09/23/20 1347  For home use only DME Walker rolling  Once       Question Answer Comment  Walker: With [place]   Patient needs a walker to treat with the following condition Weakness      09/23/20 1349   09/23/20 1347  For home use only DME 3 n 1  Once        09/23/20 1349   09/23/20 1347  For home use only DME Other see comment  Once       Comments: Trapeze bar for her hospital bed ( already has hospital at [facility])   Call daughter [facility] May [phone] for delivery  Question:  Length of Need  Answer:  Lifetime   09/23/20 1349

## 2020-09-24 LAB — CBC
HCT: 36.3 % (ref 36.0–46.0)
Hemoglobin: 11.5 g/dL — ABNORMAL LOW (ref 12.0–15.0)
MCH: 30.4 pg (ref 26.0–34.0)
MCHC: 31.7 g/dL (ref 30.0–36.0)
MCV: 96 fL (ref 80.0–100.0)
Platelets: 212 10*3/uL (ref 150–400)
RBC: 3.78 MIL/uL — ABNORMAL LOW (ref 3.87–5.11)
RDW: 15.9 % — ABNORMAL HIGH (ref 11.5–15.5)
WBC: 4.3 10*3/uL (ref 4.0–10.5)
nRBC: 0 % (ref 0.0–0.2)

## 2020-09-24 LAB — COMPREHENSIVE METABOLIC PANEL
ALT: 13 U/L (ref 0–44)
AST: 29 U/L (ref 15–41)
Albumin: 2.5 g/dL — ABNORMAL LOW (ref 3.5–5.0)
Alkaline Phosphatase: 56 U/L (ref 38–126)
Anion gap: 10 (ref 5–15)
BUN: 19 mg/dL (ref 8–23)
CO2: 29 mmol/L (ref 22–32)
Calcium: 8.9 mg/dL (ref 8.9–10.3)
Chloride: 105 mmol/L (ref 98–111)
Creatinine, Ser: 1.6 mg/dL — ABNORMAL HIGH (ref 0.44–1.00)
GFR, Estimated: 33 mL/min — ABNORMAL LOW (ref >60)
Glucose, Bld: 97 mg/dL (ref 70–99)
Potassium: 3.9 mmol/L (ref 3.5–5.1)
Sodium: 144 mmol/L (ref 135–145)
Total Bilirubin: 0.5 mg/dL (ref 0.3–1.2)
Total Protein: 6.6 g/dL (ref 6.5–8.1)

## 2020-09-24 LAB — CULTURE, BLOOD (ROUTINE X 2)
Culture: NO GROWTH
Special Requests: ADEQUATE

## 2020-09-24 MED ORDER — AMLODIPINE BESYLATE 5 MG PO TABS
5.0000 mg | ORAL_TABLET | Freq: Every day | ORAL | 0 refills | Status: DC
Start: 2020-09-25 — End: 2021-07-23

## 2020-09-24 MED ORDER — FUROSEMIDE 40 MG PO TABS
40.0000 mg | ORAL_TABLET | Freq: Every morning | ORAL | Status: DC
  Administered 2020-09-24: 40 mg via ORAL

## 2020-09-24 MED ORDER — METOPROLOL TARTRATE 12.5 MG HALF TABLET
12.5000 mg | ORAL_TABLET | Freq: Two times a day (BID) | ORAL | Status: DC
  Administered 2020-09-24 (×2): 12.5 mg via ORAL
  Filled 2020-09-24 (×2): qty 1

## 2020-09-24 NOTE — Progress Notes (Signed)
Occupational Therapy Treatment Patient Details Name: [name] MRN: [id number] DOB: 1941/03/20 Today's Date: 09/24/2020    History of present illness 79 year old woman admitted with bacteremia and sepsis due to E Coli UTI.  PMH significant for  HFpEF, CAD, sick sinus syndrome with a pacemaker, CKD, HTN.   OT comments  Pt progressing to OOB ADL at sink in sitting and standing for grooming. Pt requires maxA for pericare in back, but pt set-upA for pericare in front. Pt Pt's daughter in room. OTR describing AE (reacher, LH sponge and drop arm BSC) to assist pt with promoting independence. Pt ambulating in room with RW and chair to follow. Pt's HR increasing from 90 to 135 BPM with exertion. Pt/family education on energy conservation, DME and AE. Pt very attentive and daughter writing notes. Pt would greatly benefit from continued OT skilled services for ADL, mobility and safety in [facility] setting. OT following acutely.    Follow Up Recommendations  Home health OT;Supervision/Assistance - 24 hour    Equipment Recommendations  3 in 1 bedside commode;Wheelchair (measurements OT);Wheelchair cushion (measurements OT);Hospital bed (drop arm BSC)    Recommendations for Other Services      Precautions / Restrictions Precautions Precautions: Fall Precaution Comments: monitor for shortness of breath Restrictions Weight Bearing Restrictions: No       Mobility Bed Mobility               General bed mobility comments: at EOB  Transfers Overall transfer level: Needs assistance Equipment used: Rolling walker (2 wheeled) Transfers: Sit to/from Stand Sit to Stand: Min assist         General transfer comment: MinA    Balance Overall balance assessment: Modified Independent                                         ADL either performed or assessed with clinical judgement   ADL Overall ADL's : Needs assistance/impaired     Grooming:  Supervision/safety;Standing;Sitting Grooming Details (indicate cue type and reason): standing, leaning over counter for brushing false teeth                 Toilet Transfer: Min guard;Ambulation;Comfort height toilet;RW Armed forces technical officer Details (indicate cue type and reason): increased time to ambulate from recliner to BA, but pt able with RW Toileting- Clothing Manipulation and Hygiene: Maximal assistance;Sitting/lateral lean;Sit to/from stand Toileting - Clothing Manipulation Details (indicate cue type and reason): ableto stand and bend over to assist NT     Functional mobility during ADLs: Min guard;Rolling walker;Cueing for safety General ADL Comments: Pt tolerating session well with increased ability to care for self safely. Pt with SOB noted, but appears to be  >90% on RA. HR increases to 135 from 90 BPM at rest. Pt      Vision   Vision Assessment?: No apparent visual deficits   Perception     Praxis      Cognition Arousal/Alertness: Awake/alert Behavior During Therapy: WFL for tasks assessed/performed Overall Cognitive Status: Within Functional Limits for tasks assessed                                 General Comments: increased time and cues to process basic task mobility tasks        Exercises     Shoulder Instructions  General Comments Pt's daughter in room. OTR describing AE (reacher, LH sponge and drop arm BSC) to assist pt with promoting independence.    Pertinent Vitals/ Pain       Pain Assessment: No/denies pain Pain Intervention(s): Monitored during session  Home Living                                          Prior Functioning/Environment              Frequency  Min 2X/week        Progress Toward Goals  OT Goals(current goals can now be found in the care plan section)  Progress towards OT goals: Progressing toward goals  Acute Rehab OT Goals Patient Stated Goal: return to independence OT Goal  Formulation: With patient/family Time For Goal Achievement: 10/05/20 Potential to Achieve Goals: Good ADL Goals Pt Will Perform Lower Body Bathing: with min assist;sitting/lateral leans;sit to/from stand;with adaptive equipment Pt Will Perform Lower Body Dressing: with min assist;sitting/lateral leans;sit to/from stand;with adaptive equipment Pt Will Transfer to Toilet: with min assist;ambulating;regular height toilet Pt/caregiver will Perform Home Exercise Program: Increased strength;Both right and left upper extremity;With theraband Additional ADL Goal #1: Pt will tolerate standing ADL for 3 minutes with min A  Plan Discharge plan needs to be updated    Co-evaluation    PT/OT/SLP Co-Evaluation/Treatment: Yes Reason for Co-Treatment: To address functional/ADL transfers;Complexity of the patient's impairments (multi-system involvement)          AM-PAC OT "6 Clicks" Daily Activity     Outcome Measure   Help from another person eating meals?: A Little Help from another person taking care of personal grooming?: A Little Help from another person toileting, which includes using toliet, bedpan, or urinal?: A Lot Help from another person bathing (including washing, rinsing, drying)?: A Lot Help from another person to put on and taking off regular upper body clothing?: A Lot Help from another person to put on and taking off regular lower body clothing?: Total 6 Click Score: 13    End of Session Equipment Utilized During Treatment: Rolling walker;Gait belt  OT Visit Diagnosis: Unsteadiness on feet (R26.81);Other abnormalities of gait and mobility (R26.89);Muscle weakness (generalized) (M62.81)   Activity Tolerance Patient tolerated treatment well   Patient Left in chair;with call bell/phone within reach;with family/visitor present;with chair alarm set   Nurse Communication Mobility status        Time: 4975-3005 OT Time Calculation (min): 27 min  Charges: OT General  Charges $OT Visit: 1 Visit OT Treatments $Self Care/Home Management : 8-22 mins  Jefferey Pica, OTR/L Acute Rehabilitation Services Pager: [phone] Office: [phone]    [name] 09/24/2020, 4:27 PM

## 2020-09-24 NOTE — Progress Notes (Signed)
PTAR here at this time to take pt home. Daughter at bedside.

## 2020-09-24 NOTE — TOC Progression Note (Addendum)
Transition of Care [facility]) - Progression Note    Patient Details  Name: [name] MRN: [id number] Date of Birth: [birth year]  Transition of Care [facility]) CM/SW Contact  Rider Ermis, Edson Snowball, RN Phone Number: 09/24/2020, 12:47 PM  Clinical Narrative:     [name] to patient and [name] at bedside. [employer] delivered 3 in1 and trapeze bar and ordered wheelchair.   Patient and daughter ready for discharge.   [place] aware and requesting discharge summary to be faxed to them at [phone].   PTAR paperwork work placed in chart. NCM will call when nurse ready.  PTAR called for 2:30 pm  Expected Discharge Plan: [place] Barriers to Discharge: Continued Medical Work up  Expected Discharge Plan and Services Expected Discharge Plan: [place]   Discharge Planning Services: CM Consult Post Acute Care Choice: Home Health, Durable Medical Equipment Living arrangements for the past 2 months: Single Family Home Expected Discharge Date: 09/24/20               DME Arranged: 3-N-1, Debera Lat rolling, Wheelchair manual DME Agency: [employer] Date DME Agency Contacted: 09/23/20 Time DME Agency Contacted: 1314 Representative spoke with at DME Agency: [name] HH Arranged: PT, OT, RN [place] Agency: [place] Date [place]: 09/23/20 Time [place]: 51 Representative spoke with at [place]: [place] ([place]) Interventions    Readmission Risk Interventions Readmission Risk Prevention Plan 05/28/2020  Transportation Screening Complete  PCP or Specialist Appt within 3-5 Days Complete  HRI or [place] Complete  Social Work Consult for [place] Planning/Counseling [place] Not Applicable  Medication Review Press photographer) Complete  Some recent data might be hidden

## 2020-09-24 NOTE — Care Management Important Message (Signed)
Important Message  Patient Details  Name: [name] MRN: [id number] Date of Birth: 1940/11/02   Medicare Important Message Given:  Yes     Orbie Pyo 09/24/2020, 3:06 PM

## 2020-09-24 NOTE — Progress Notes (Signed)
Physical Therapy Treatment Patient Details Name: [name] MRN: [id number] DOB: 1940/11/30 Today's Date: 09/24/2020    History of Present Illness 79 year old woman admitted with bacteremia and sepsis due to E Coli UTI.  PMH significant for  HFpEF, CAD, sick sinus syndrome with a pacemaker, CKD, HTN.    PT Comments    Continuing work on functional mobility and activity tolerance;  Session focused on gait and ADLs in prep for dc home; Walked to bathroom to use the [facility] (see OT note for details related to ADLs); Able to [place] amb, and noting very nice progress with sit to stand transfers; Questions solicited and answered; OK for dc home from PT standpoint ;   Pt's daughter with questions about managing ADLs at home -- passed them along to [name]; HR range 118 bpm highest observed; settled back down to 70s with seated rest; session conducted on Room air and O2 sats stayed in the 90s with a reliable pleth wave   Follow Up Recommendations  Home health PT;Supervision/Assistance - 24 hour     Equipment Recommendations  Wheelchair (measurements PT);Wheelchair cushion (measurements PT);Rolling walker with 5" wheels;3in1 (PT) (Drop-arm BSC; Aundra Dubin; agree with ambulance home)    Recommendations for Other Services       Precautions / Restrictions Precautions Precautions: Fall Precaution Comments: monitor for shortness of breath    Mobility  Bed Mobility Overal bed mobility: Needs Assistance Bed Mobility: Supine to Sit     Supine to sit: Min assist     General bed mobility comments: Incr time to complete task; min handheld assist to stabilize and pull with reciprocal scooting to EOB  Transfers Overall transfer level: Needs assistance Equipment used: Rolling walker (2 wheeled) Transfers: Sit to/from Stand Sit to Stand: Min assist;+2 safety/equipment;Min guard         General transfer comment: Min assist of 2 for bilateral support with forward weight shift; tends to  lift off with hips flexed and then pushes on RW to stand, when standing from EOB; Able to stand form 3in1 using armrests to push up with close guard for safety, but not needing physical assist  Ambulation/Gait Ambulation/Gait assistance: Min guard;+2 safety/equipment (chair push) Gait Distance (Feet): 30 Feet (10 to bathroom plus 20 to the door) Assistive device: Rolling walker (2 wheeled) (Bari RW at [employer] height) Gait Pattern/deviations: Step-through pattern;Decreased step length - right;Decreased step length - left;Trunk flexed     General Gait Details: Heavy dependence on RW; tending to keep trunk flexed; notable DOE 2/4, getting to 3/4 and needing seated rest; HR got as high as 118 bpm with amb today   Stairs             Wheelchair Mobility    Modified Rankin (Stroke Patients Only)       Balance     Sitting balance-Leahy Scale: Good       Standing balance-Leahy Scale:  (approaching Fair)                              Cognition Arousal/Alertness: Awake/alert Behavior During Therapy: WFL for tasks assessed/performed Overall Cognitive Status: Within Functional Limits for tasks assessed (for simple mobility tasks)                                        Exercises      General Comments  General comments (skin integrity, edema, etc.): Pt's daughter with questions about managing ADLs at home -- passed them along to [address]; HR range 118 bpm highest observed; settled back down to 70s with seated rest; session conducted on Room air and O2 sats stayed in the 90s with a reliable pleth wave      Pertinent Vitals/Pain Pain Assessment: No/denies pain Pain Intervention(s): Monitored during session    Home Living                      Prior Function            PT Goals (current goals can now be found in the care plan section) Acute Rehab PT Goals Patient Stated Goal: return to independence PT Goal Formulation: With patient Time  For Goal Achievement: 10/05/20 Potential to Achieve Goals: Good Progress towards PT goals: Progressing toward goals    Frequency    Min 4X/week      PT Plan Current plan remains appropriate    Co-evaluation PT/OT/SLP Co-Evaluation/Treatment: Yes ([place]) Reason for Co-Treatment: To address functional/ADL transfers;Other (comment) (portionof sessions together to prblem-solve dc home)          AM-PAC PT "6 Clicks" Mobility   Outcome Measure  Help needed turning from your back to your side while in a flat bed without using bedrails?: A Little Help needed moving from lying on your back to sitting on the side of a flat bed without using bedrails?: A Little Help needed moving to and from a bed to a chair (including a wheelchair)?: A Little Help needed standing up from a chair using your arms (e.g., wheelchair or bedside chair)?: A Little Help needed to walk in hospital room?: A Lot Help needed climbing 3-5 steps with a railing? : A Lot 6 Click Score: 16    End of Session Equipment Utilized During Treatment: Gait belt (rolled sheet as gait belt) Activity Tolerance: Patient tolerated treatment well Patient left: in chair;with call bell/phone within reach (with OT continuing session) Nurse Communication: Mobility status PT Visit Diagnosis: Unsteadiness on feet (R26.81);Other abnormalities of gait and mobility (R26.89);Muscle weakness (generalized) (M62.81);Difficulty in walking, not elsewhere classified (R26.2)     Time: 4665-9935 PT Time Calculation (min) (ACUTE ONLY): 41 min  Charges:  $Gait Training: 8-22 mins                     Roney Marion, [place] Pager [phone] Office [phone]    Colletta [place] 09/24/2020, 11:01 AM

## 2020-09-24 NOTE — Progress Notes (Signed)
Subjective:   [name] had no acute events overnight. She states that she feels better except for a new mild, bilateral frontal headache and pain behind her eyes. She notes that she has recently had new sores on her left leg although her previous wounds are healing well. She continues to endorse a mild productive cough of yellow sputum but denies any changes in urination including dysuria, and denies any CP, SOB, fevers, or chills.   Objective:  Vital signs in last 24 hours: Vitals:   09/23/20 0338 09/23/20 1305 09/23/20 2039 09/24/20 0515  BP: 111/76 133/79 113/70 138/67  Pulse: 75 (!) 57 79 (!) 59  Resp: 18 18 18 18   Temp: 98.5 F (36.9 C) 98.3 F (36.8 C) 98.2 F (36.8 C) 98 F (36.7 C)  TempSrc: Oral Oral Oral   SpO2: 92% 95% 96% 98%  Weight:      Height:       General: Patient is morbidly obese. She appears well. No acute distress. Eyes: Sclera non-icteric. No conjunctival injection.  HENT: MMM, no nasal discharge.  Respiratory: Lungs are CTA, bilaterally. No wheezes, rales, or rhonchi.  Cardiovascular: Regular rate and rhythm. No murmurs, rubs, or gallops. There is trace bilateral non-pitting edema. Abdominal: Soft and non-tender to palpation. Bowel sounds intact. No rebound or guarding. Skin: There are chronic venous stasis changes of the bilateral lower extremities. No other lesions or rashes noted. Psych: Normal affect. Normal tone of voice.   Assessment/Plan:  Principal Problem:   Bacteremia due to Escherichia coli Active Problems:   Morbid obesity (HCC)   Paroxysmal atrial fibrillation (HCC)   Pressure injury of skin   Acute on chronic heart failure with preserved ejection fraction (HFpEF) ([place])  # E. Coli Bacteremia  # Productive Cough Patient recently presented to the ED and was given ceftriaxone for presumed viral infection given recent increase in DOE, productive cough, decreased PO intake and discharged given unremarkable CXR and UA. However, blood  cultures returned positive after discharge for bacteremia. Patient was started on ABX and is finishing her last day (Day 7) of Cefdinir today. Repeat blood cultures this admission were negative x 2.  - Continue Cefdinir 300mg  twice daily today, stop tomorrow - Patient is ready for discharge home with [facility]   # Acute on Chronic Diastolic Heart Failure, EF 60%  Patient's shortness of breath, cough, and fatigue have significantly improved s/p diuresis with last dose of Lasix yesterday morning. She clinically appears nearly euvolemic with clear lungs on examination and minimal lower extremity swelling.  - Will restart home Lasix 40mg  daily   # AKI on CKD stage IIIb  Creatinine is 1.60, slightly increased from baseline ~1.39 in August 2021, likely due to recent diuresis.  - Avoid nephrotoxic agents with outpatient BMP  # HTN  Patient's home Lopressor and Hydralazine were held (likely due to mild CHF exacerbation/infection) and she was started on Amlodipine 5mg  daily, which she has responded to well.  - Will discharge on Amlodipine 5mg  daily  - Patient would benefit from switching to home medications after discharge  # Sick Sinus Syndrome s/p Pacemaker Placement # Paroxysmal A-Fib  Patient remains in NSR with minimal pacing on telemetry since yesterday. CHADs-VASc 7. - Continue home Eliquis 5mg  twice daily - Continue Amlodipine 200mg  daily   # Normocytic Anemia, Stable - May be followed up outpatient.  Prior to Admission Living Arrangement: Home Anticipated Discharge Location: Home with Home Health Services Barriers to Discharge: None Dispo: Anticipated  discharge today.  Darlene Bennett, MD 09/24/2020, 1:07 PM Pager: [phone] After 5pm on weekdays and 1pm on weekends: On Call pager [phone]

## 2020-09-24 NOTE — Discharge Summary (Signed)
Name: [name] MRN: [id number] DOB: [birth year] 79 y.o. PCP: Darlene Morel, MD  Date of Admission: 09/19/2020 12:31 PM Date of Discharge:  Attending Physician: Oda Kilts, MD  Discharge Diagnosis: Principal Problem:   Bacteremia due to Escherichia coli Active Problems:   Morbid obesity (HCC)   Paroxysmal atrial fibrillation (HCC)   Pressure injury of skin   Acute on chronic heart failure with preserved ejection fraction (HFpEF) (HCC)  1. E. Coli Bacteremia 2. Acute on chronic HFpEF  3. AKI on CKD stage 3b  4. Hypertension  5. Sick Sinus Syndrome w/ Episode of A-Flutter  6. Chronic normocytic anemia   Discharge Medications: Allergies as of 09/24/2020      Reactions   Ampicillin Other (See Comments)   Increases heart rhythm Has tolerated ceftriaxone in the past - 06/2020   Penicillins Other (See Comments)   Increases heart rhythm   Linzess [linaclotide] Other (See Comments)   Severe stomach pains      Medication List    STOP taking these medications   hydrALAZINE 50 MG tablet Commonly known as: APRESOLINE   meclizine 25 MG tablet Commonly known as: ANTIVERT     TAKE these medications   acetaminophen 500 MG tablet Commonly known as: TYLENOL Take 1,000 mg by mouth every 6 (six) hours as needed for moderate pain.   albuterol 108 (90 Base) MCG/ACT inhaler Commonly known as: VENTOLIN HFA Inhale 2 puffs into the lungs every 6 (six) hours as needed for wheezing or shortness of breath.   allopurinol 100 MG tablet Commonly known as: ZYLOPRIM Take 100 mg by mouth daily.   amiodarone 200 MG tablet Commonly known as: PACERONE Take 200 mg by mouth daily.   amLODipine 5 MG tablet Commonly known as: NORVASC Take 1 tablet (5 mg total) by mouth daily. Start taking on: September 25, 2020   apixaban 5 MG Tabs tablet Commonly known as: ELIQUIS Take 5 mg by mouth 2 (two) times daily.   aspirin EC 81 MG tablet Take 81 mg by mouth  daily.   famotidine 40 MG tablet Commonly known as: PEPCID Take 40 mg by mouth daily as needed for heartburn or indigestion.   fluticasone 50 MCG/ACT nasal spray Commonly known as: FLONASE Place 2 sprays into both nostrils at bedtime as needed for allergies or rhinitis.   furosemide 40 MG tablet Commonly known as: LASIX Take 40 mg by mouth in the morning.   metoprolol tartrate 25 MG tablet Commonly known as: LOPRESSOR Take 0.5 tablets (12.5 mg total) by mouth 2 (two) times daily.   nitroGLYCERIN 0.4 MG SL tablet Commonly known as: NITROSTAT Place 0.4 mg under the tongue every 5 (five) minutes as needed for chest pain.   oxybutynin 5 MG tablet Commonly known as: DITROPAN Take 5 mg by mouth 2 (two) times daily.   oxyCODONE-acetaminophen 5-325 MG tablet Commonly known as: PERCOCET/ROXICET Take 1 tablet by mouth See admin instructions. Take 1 tablet by mouth two to three times a day   pantoprazole 40 MG tablet Commonly known as: PROTONIX Take 40 mg by mouth 2 (two) times daily.   potassium chloride SA 20 MEQ tablet Commonly known as: KLOR-CON Take 1 tablet (20 mEq total) by mouth daily. What changed: when to take this   rosuvastatin 10 MG tablet Commonly known as: CRESTOR Take 10 mg by mouth daily.            Durable Medical Equipment  (From admission, onward)  Start     Ordered   09/23/20 1349  For home use only DME standard manual wheelchair with seat cushion  Once       Comments: Patient suffers from Bacteremia due to Escherichia coli, CHF  which impairs their ability to perform daily activities like ADL's and ambulation  in the home.  A walker  will not resolve issue with performing activities of daily living. A wheelchair will allow patient to safely perform daily activities. Patient can safely propel the wheelchair in the home or has a caregiver who can provide assistance. Length of need lifetime . Accessories: elevating leg rests (ELRs), wheel locks,  extensions and anti-tippers.  Seat and back cushions   09/23/20 1349   09/23/20 1347  For home use only DME Walker rolling  Once       Question Answer Comment  Walker: With [place]   Patient needs a walker to treat with the following condition Weakness      09/23/20 1349   09/23/20 1347  For home use only DME 3 n 1  Once        09/23/20 1349   09/23/20 1347  For home use only DME Other see comment  Once       Comments: Trapeze bar for her hospital bed ( already has hospital at [facility])   Call daughter [facility] May [phone] for delivery  Question:  Length of Need  Answer:  Lifetime   09/23/20 1349          Disposition and follow-up:   [name] was discharged from [facility] in Stable condition.  At the hospital follow up visit please address:  1.   A. HFpEF - be sure patient is euvolemic on Lasix 40mg  daily  B. Productive cough - consider repeat CXR if concern for PNA   C. Hypertension - patient's lopressor and hydralazine were held and patient was discharged on amlodipine 5mg  daily. Please consider adding back Lopressor and Hydralazine given CHF.   2.  Labs / imaging needed at time of follow-up: CBC, BMP +/- CXR or U/A if symptomatic   3.  Pending labs/ test needing follow-up: None  Follow-up Appointments:   Hospital Course by problem list: 1. Bacteremia 2/2 E. coli infection: Patient originally seen in ED 11/25, found to be febrile, HDS, no obvious source of infection. She was discharged with what was thought to be a viral illness. UCx, BCx grew E. coli and she was instructed to return to the hospital for admission. In the ED, patient became hypotensive and was given 3L IVF. Afterward patient remained afebrile, HDS. Completed 7d course of antibiotics. She was able to be discharged with Home Health per patient and family request.   2. Acute on chronic diastolic heart failure: On arrival patient not requiring oxygen, appeared hypovolemic  2/2 poor po intake. After fluid resuscitation in ED, patient became hypervolemic requiring 3L O2. After diuresis, patient euvolemic and started back on home lasix, 40mg  daily. She does continue to have a mild productive cough of yellow sputum although CXR during admission did not show consolidation to suggest PNA and she received appropriate ABX to cover S. PNA although consider repeat CXR if indicated.   3. AKI on CKD IIIb: On arrival, creatinine elevated from baseline in setting of infection and recent poor po intake. After fluids, renal functions slightly improved. Continued to monitor as lasix were given. Patient will need follow-up in clinic for further monitoring.  Discharge Vitals:   BP 111/64 (BP Location: Right Arm)   Pulse 60   Temp 98.1 F (36.7 C) (Oral)   Resp 20   Ht 5\' 7"  (1.702 m)   Wt (!) 150.1 kg   SpO2 96%   BMI 51.84 kg/m   Pertinent Labs, Studies, and Procedures:  Initial BNP - 107.8, troponin 23 > 24 LA - WNL, no leukocytosis   Creatinine 1.59, GFR 33 on admission   CXR - cardiomegaly, venous congestion, atelectasis/infiltrate, goiter   Discharge Instructions: Discharge Instructions    (HEART FAILURE PATIENTS) Call MD:  Anytime you have any of the following symptoms: 1) 3 pound weight gain in 24 hours or 5 pounds in 1 week 2) shortness of breath, with or without a dry hacking cough 3) swelling in the hands, feet or stomach 4) if you have to sleep on extra pillows at night in order to breathe.   Complete by: As directed    Call MD for:  difficulty breathing, headache or visual disturbances   Complete by: As directed    Call MD for:  extreme fatigue   Complete by: As directed    Call MD for:  persistant dizziness or light-headedness   Complete by: As directed    Call MD for:  persistant nausea and vomiting   Complete by: As directed    Call MD for:  redness, tenderness, or signs of infection (pain, swelling, redness, odor or green/yellow discharge around  incision site)   Complete by: As directed    Call MD for:  severe uncontrolled pain   Complete by: As directed    Call MD for:  temperature >100.4   Complete by: As directed    Diet - low sodium heart healthy   Complete by: As directed    Discharge instructions   Complete by: As directed    [name],   You were admitted to the hospital due to an infection in your blood stream. The source of the infection was a UTI. You received treatment with IV antibiotics for 7 days.   While here, you also experienced a flare of your heart failure that required IV fluid medication. At this time, you may resume your normal Lasix amount at home.   We also adjusted your blood pressure medication. Hydralazine was stopped and you were started on Amlodipine. Please continue taking Amlodipine once a day ONLY. Do not restart Hydralazine. Please follow up with your heart doctor in 1-2 weeks.   It was a pleasure meeting you and your daughter. We wish you the best!   Increase activity slowly   Complete by: As directed    No wound care   Complete by: As directed       Signed: Jeralyn Bennett, MD 09/24/2020, 5:04 PM   Pager: [phone]

## 2020-10-13 DIAGNOSIS — H2510 Age-related nuclear cataract, unspecified eye: Secondary | ICD-10-CM | POA: Insufficient documentation

## 2020-10-13 DIAGNOSIS — H5703 Miosis: Secondary | ICD-10-CM | POA: Insufficient documentation

## 2020-12-31 DIAGNOSIS — B351 Tinea unguium: Secondary | ICD-10-CM | POA: Insufficient documentation

## 2020-12-31 DIAGNOSIS — M2042 Other hammer toe(s) (acquired), left foot: Secondary | ICD-10-CM | POA: Insufficient documentation

## 2020-12-31 DIAGNOSIS — M2041 Other hammer toe(s) (acquired), right foot: Secondary | ICD-10-CM | POA: Insufficient documentation

## 2021-07-16 ENCOUNTER — Encounter (HOSPITAL_COMMUNITY): Payer: Self-pay | Admitting: Emergency Medicine

## 2021-07-16 ENCOUNTER — Emergency Department (HOSPITAL_COMMUNITY): Payer: Medicare Other

## 2021-07-16 ENCOUNTER — Inpatient Hospital Stay (HOSPITAL_COMMUNITY)
Admission: EM | Admit: 2021-07-16 | Discharge: 2021-07-23 | DRG: 291 | Disposition: A | Discharge status: Home Health | Payer: Medicare Other | Attending: Student | Admitting: Student

## 2021-07-16 DIAGNOSIS — K219 Gastro-esophageal reflux disease without esophagitis: Secondary | ICD-10-CM | POA: Diagnosis present

## 2021-07-16 DIAGNOSIS — Z950 Presence of cardiac pacemaker: Secondary | ICD-10-CM

## 2021-07-16 DIAGNOSIS — Z8673 Personal history of transient ischemic attack (TIA), and cerebral infarction without residual deficits: Secondary | ICD-10-CM | POA: Diagnosis not present

## 2021-07-16 DIAGNOSIS — K5900 Constipation, unspecified: Secondary | ICD-10-CM | POA: Diagnosis present

## 2021-07-16 DIAGNOSIS — R0602 Shortness of breath: Secondary | ICD-10-CM | POA: Diagnosis present

## 2021-07-16 DIAGNOSIS — E7800 Pure hypercholesterolemia, unspecified: Secondary | ICD-10-CM | POA: Diagnosis present

## 2021-07-16 DIAGNOSIS — Z7901 Long term (current) use of anticoagulants: Secondary | ICD-10-CM | POA: Diagnosis not present

## 2021-07-16 DIAGNOSIS — I480 Paroxysmal atrial fibrillation: Secondary | ICD-10-CM | POA: Diagnosis not present

## 2021-07-16 DIAGNOSIS — F32A Depression, unspecified: Secondary | ICD-10-CM | POA: Diagnosis present

## 2021-07-16 DIAGNOSIS — Z90710 Acquired absence of both cervix and uterus: Secondary | ICD-10-CM | POA: Diagnosis not present

## 2021-07-16 DIAGNOSIS — E1151 Type 2 diabetes mellitus with diabetic peripheral angiopathy without gangrene: Secondary | ICD-10-CM | POA: Diagnosis present

## 2021-07-16 DIAGNOSIS — Z7982 Long term (current) use of aspirin: Secondary | ICD-10-CM | POA: Diagnosis not present

## 2021-07-16 DIAGNOSIS — R5381 Other malaise: Secondary | ICD-10-CM | POA: Diagnosis not present

## 2021-07-16 DIAGNOSIS — I130 Hypertensive heart and chronic kidney disease with heart failure and stage 1 through stage 4 chronic kidney disease, or unspecified chronic kidney disease: Principal | ICD-10-CM | POA: Diagnosis present

## 2021-07-16 DIAGNOSIS — J189 Pneumonia, unspecified organism: Secondary | ICD-10-CM | POA: Diagnosis not present

## 2021-07-16 DIAGNOSIS — L97929 Non-pressure chronic ulcer of unspecified part of left lower leg with unspecified severity: Secondary | ICD-10-CM | POA: Diagnosis present

## 2021-07-16 DIAGNOSIS — I872 Venous insufficiency (chronic) (peripheral): Secondary | ICD-10-CM | POA: Diagnosis not present

## 2021-07-16 DIAGNOSIS — L89152 Pressure ulcer of sacral region, stage 2: Secondary | ICD-10-CM | POA: Diagnosis present

## 2021-07-16 DIAGNOSIS — Z20822 Contact with and (suspected) exposure to covid-19: Secondary | ICD-10-CM | POA: Diagnosis present

## 2021-07-16 DIAGNOSIS — J209 Acute bronchitis, unspecified: Secondary | ICD-10-CM | POA: Diagnosis present

## 2021-07-16 DIAGNOSIS — I5033 Acute on chronic diastolic (congestive) heart failure: Secondary | ICD-10-CM | POA: Diagnosis present

## 2021-07-16 DIAGNOSIS — L0390 Cellulitis, unspecified: Secondary | ICD-10-CM | POA: Diagnosis not present

## 2021-07-16 DIAGNOSIS — I2510 Atherosclerotic heart disease of native coronary artery without angina pectoris: Secondary | ICD-10-CM | POA: Diagnosis not present

## 2021-07-16 DIAGNOSIS — Z79899 Other long term (current) drug therapy: Secondary | ICD-10-CM | POA: Diagnosis not present

## 2021-07-16 DIAGNOSIS — R531 Weakness: Secondary | ICD-10-CM | POA: Diagnosis not present

## 2021-07-16 DIAGNOSIS — I10 Essential (primary) hypertension: Secondary | ICD-10-CM | POA: Diagnosis not present

## 2021-07-16 DIAGNOSIS — I252 Old myocardial infarction: Secondary | ICD-10-CM | POA: Diagnosis not present

## 2021-07-16 DIAGNOSIS — Z6841 Body Mass Index (BMI) 40.0 and over, adult: Secondary | ICD-10-CM | POA: Diagnosis not present

## 2021-07-16 DIAGNOSIS — R0600 Dyspnea, unspecified: Secondary | ICD-10-CM

## 2021-07-16 DIAGNOSIS — I447 Left bundle-branch block, unspecified: Secondary | ICD-10-CM | POA: Diagnosis not present

## 2021-07-16 DIAGNOSIS — N1832 Chronic kidney disease, stage 3b: Secondary | ICD-10-CM | POA: Diagnosis present

## 2021-07-16 DIAGNOSIS — E1122 Type 2 diabetes mellitus with diabetic chronic kidney disease: Secondary | ICD-10-CM | POA: Diagnosis present

## 2021-07-16 DIAGNOSIS — R042 Hemoptysis: Secondary | ICD-10-CM | POA: Diagnosis not present

## 2021-07-16 DIAGNOSIS — I70229 Atherosclerosis of native arteries of extremities with rest pain, unspecified extremity: Secondary | ICD-10-CM | POA: Diagnosis not present

## 2021-07-16 DIAGNOSIS — N1831 Chronic kidney disease, stage 3a: Secondary | ICD-10-CM | POA: Diagnosis not present

## 2021-07-16 LAB — COMPREHENSIVE METABOLIC PANEL
ALT: 10 U/L (ref 0–44)
AST: 20 U/L (ref 15–41)
Albumin: 3.1 g/dL — ABNORMAL LOW (ref 3.5–5.0)
Alkaline Phosphatase: 91 U/L (ref 38–126)
Anion gap: 5 (ref 5–15)
BUN: 14 mg/dL (ref 8–23)
CO2: 28 mmol/L (ref 22–32)
Calcium: 8.6 mg/dL — ABNORMAL LOW (ref 8.9–10.3)
Chloride: 107 mmol/L (ref 98–111)
Creatinine, Ser: 1.48 mg/dL — ABNORMAL HIGH (ref 0.44–1.00)
GFR, Estimated: 36 mL/min — ABNORMAL LOW (ref >60)
Glucose, Bld: 104 mg/dL — ABNORMAL HIGH (ref 70–99)
Potassium: 4 mmol/L (ref 3.5–5.1)
Sodium: 140 mmol/L (ref 135–145)
Total Bilirubin: 0.6 mg/dL (ref 0.3–1.2)
Total Protein: 7.6 g/dL (ref 6.5–8.1)

## 2021-07-16 LAB — CBC WITH DIFFERENTIAL/PLATELET
Abs Immature Granulocytes: 0.02 10*3/uL (ref 0.00–0.07)
Basophils Absolute: 0.1 10*3/uL (ref 0.0–0.1)
Basophils Relative: 1 %
Eosinophils Absolute: 0.3 10*3/uL (ref 0.0–0.5)
Eosinophils Relative: 8 %
HCT: 38.7 % (ref 36.0–46.0)
Hemoglobin: 11.9 g/dL — ABNORMAL LOW (ref 12.0–15.0)
Immature Granulocytes: 1 %
Lymphocytes Relative: 34 %
Lymphs Abs: 1.4 10*3/uL (ref 0.7–4.0)
MCH: 30.1 pg (ref 26.0–34.0)
MCHC: 30.7 g/dL (ref 30.0–36.0)
MCV: 97.7 fL (ref 80.0–100.0)
Monocytes Absolute: 0.6 10*3/uL (ref 0.1–1.0)
Monocytes Relative: 16 %
Neutro Abs: 1.6 10*3/uL — ABNORMAL LOW (ref 1.7–7.7)
Neutrophils Relative %: 40 %
Platelets: 196 10*3/uL (ref 150–400)
RBC: 3.96 MIL/uL (ref 3.87–5.11)
RDW: 17.3 % — ABNORMAL HIGH (ref 11.5–15.5)
WBC: 4 10*3/uL (ref 4.0–10.5)
nRBC: 0 % (ref 0.0–0.2)

## 2021-07-16 LAB — URINALYSIS, ROUTINE W REFLEX MICROSCOPIC
Bacteria, UA: NONE SEEN
Bilirubin Urine: NEGATIVE
Glucose, UA: NEGATIVE mg/dL
Hgb urine dipstick: NEGATIVE
Ketones, ur: NEGATIVE mg/dL
Nitrite: NEGATIVE
Protein, ur: NEGATIVE mg/dL
Specific Gravity, Urine: 1.006 (ref 1.005–1.030)
pH: 7 (ref 5.0–8.0)

## 2021-07-16 LAB — RESP PANEL BY RT-PCR (FLU A&B, COVID) ARPGX2
Influenza A by PCR: NEGATIVE
Influenza B by PCR: NEGATIVE
SARS Coronavirus 2 by RT PCR: NEGATIVE

## 2021-07-16 LAB — LACTIC ACID, PLASMA
Lactic Acid, Venous: 1.1 mmol/L (ref 0.5–1.9)
Lactic Acid, Venous: 1.2 mmol/L (ref 0.5–1.9)

## 2021-07-16 LAB — HIV ANTIBODY (ROUTINE TESTING W REFLEX): HIV Screen 4th Generation wRfx: NONREACTIVE

## 2021-07-16 LAB — CBG MONITORING, ED: Glucose-Capillary: 98 mg/dL (ref 70–99)

## 2021-07-16 LAB — BRAIN NATRIURETIC PEPTIDE: B Natriuretic Peptide: 71.1 pg/mL (ref 0.0–100.0)

## 2021-07-16 MED ORDER — ASPIRIN EC 81 MG PO TBEC
81.0000 mg | DELAYED_RELEASE_TABLET | Freq: Every day | ORAL | Status: DC
  Administered 2021-07-16 – 2021-07-19 (×4): 81 mg via ORAL
  Filled 2021-07-16 (×4): qty 1

## 2021-07-16 MED ORDER — ALLOPURINOL 100 MG PO TABS
100.0000 mg | ORAL_TABLET | Freq: Every day | ORAL | Status: DC
  Administered 2021-07-17 – 2021-07-23 (×7): 100 mg via ORAL
  Filled 2021-07-16 (×7): qty 1

## 2021-07-16 MED ORDER — ACETAMINOPHEN 500 MG PO TABS: 1000.0000 mg | ORAL_TABLET | Freq: Four times a day (QID) | ORAL | Status: DC | PRN

## 2021-07-16 MED ORDER — FUROSEMIDE 10 MG/ML IJ SOLN
40.0000 mg | Freq: Once | INTRAMUSCULAR | Status: AC
  Administered 2021-07-16: 40 mg via INTRAVENOUS
  Filled 2021-07-16: qty 4

## 2021-07-16 MED ORDER — GUAIFENESIN ER 600 MG PO TB12
1200.0000 mg | ORAL_TABLET | Freq: Two times a day (BID) | ORAL | Status: DC
  Administered 2021-07-16 – 2021-07-19 (×7): 1200 mg via ORAL
  Filled 2021-07-16 (×7): qty 2

## 2021-07-16 MED ORDER — SODIUM CHLORIDE 0.9 % IV SOLN
500.0000 mg | Freq: Once | INTRAVENOUS | Status: AC
  Administered 2021-07-16: 500 mg via INTRAVENOUS
  Filled 2021-07-16: qty 500

## 2021-07-16 MED ORDER — AZITHROMYCIN 250 MG PO TABS
500.0000 mg | ORAL_TABLET | Freq: Every day | ORAL | Status: DC
  Administered 2021-07-17 – 2021-07-19 (×3): 500 mg via ORAL
  Filled 2021-07-16 (×3): qty 2

## 2021-07-16 MED ORDER — FUROSEMIDE 40 MG PO TABS
40.0000 mg | ORAL_TABLET | Freq: Every morning | ORAL | Status: DC
  Administered 2021-07-17 – 2021-07-18 (×2): 40 mg via ORAL
  Filled 2021-07-16 (×3): qty 1

## 2021-07-16 MED ORDER — SODIUM CHLORIDE 0.9 % IV BOLUS
1000.0000 mL | Freq: Once | INTRAVENOUS | Status: AC
  Administered 2021-07-16: 1000 mL via INTRAVENOUS

## 2021-07-16 MED ORDER — PREDNISONE 20 MG PO TABS
40.0000 mg | ORAL_TABLET | Freq: Every day | ORAL | Status: DC
  Administered 2021-07-17 – 2021-07-19 (×3): 40 mg via ORAL
  Filled 2021-07-16 (×3): qty 2

## 2021-07-16 MED ORDER — ALBUTEROL SULFATE (2.5 MG/3ML) 0.083% IN NEBU: 3.0000 mL | INHALATION_SOLUTION | Freq: Four times a day (QID) | RESPIRATORY_TRACT | Status: DC | PRN

## 2021-07-16 MED ORDER — MOMETASONE FURO-FORMOTEROL FUM 100-5 MCG/ACT IN AERO
2.0000 | INHALATION_SPRAY | Freq: Two times a day (BID) | RESPIRATORY_TRACT | Status: DC
  Administered 2021-07-16: 2 via RESPIRATORY_TRACT
  Filled 2021-07-16: qty 8.8

## 2021-07-16 MED ORDER — OXYCODONE-ACETAMINOPHEN 5-325 MG PO TABS
1.0000 | ORAL_TABLET | Freq: Three times a day (TID) | ORAL | Status: DC | PRN
  Administered 2021-07-16 – 2021-07-22 (×6): 1 via ORAL
  Filled 2021-07-16 (×6): qty 1

## 2021-07-16 MED ORDER — AMIODARONE HCL 200 MG PO TABS
200.0000 mg | ORAL_TABLET | Freq: Every day | ORAL | Status: DC
  Administered 2021-07-17 – 2021-07-23 (×7): 200 mg via ORAL
  Filled 2021-07-16 (×7): qty 1

## 2021-07-16 MED ORDER — METOPROLOL TARTRATE 12.5 MG HALF TABLET
12.5000 mg | ORAL_TABLET | Freq: Two times a day (BID) | ORAL | Status: DC
  Administered 2021-07-16 – 2021-07-23 (×14): 12.5 mg via ORAL
  Filled 2021-07-16 (×14): qty 1

## 2021-07-16 MED ORDER — ROSUVASTATIN CALCIUM 5 MG PO TABS
10.0000 mg | ORAL_TABLET | Freq: Every day | ORAL | Status: DC
  Administered 2021-07-17 – 2021-07-23 (×7): 10 mg via ORAL
  Filled 2021-07-16 (×7): qty 2

## 2021-07-16 MED ORDER — FAMOTIDINE 20 MG PO TABS: 40.0000 mg | ORAL_TABLET | Freq: Every day | ORAL | Status: DC | PRN

## 2021-07-16 MED ORDER — IPRATROPIUM-ALBUTEROL 0.5-2.5 (3) MG/3ML IN SOLN
3.0000 mL | Freq: Four times a day (QID) | RESPIRATORY_TRACT | Status: DC
  Administered 2021-07-16 (×2): 3 mL via RESPIRATORY_TRACT
  Filled 2021-07-16 (×2): qty 3

## 2021-07-16 MED ORDER — OXYBUTYNIN CHLORIDE 5 MG PO TABS
5.0000 mg | ORAL_TABLET | Freq: Two times a day (BID) | ORAL | Status: DC
  Administered 2021-07-16 – 2021-07-23 (×14): 5 mg via ORAL
  Filled 2021-07-16 (×14): qty 1

## 2021-07-16 MED ORDER — PANTOPRAZOLE SODIUM 40 MG PO TBEC
40.0000 mg | DELAYED_RELEASE_TABLET | Freq: Two times a day (BID) | ORAL | Status: DC
  Administered 2021-07-16 – 2021-07-23 (×15): 40 mg via ORAL
  Filled 2021-07-16 (×15): qty 1

## 2021-07-16 MED ORDER — APIXABAN 5 MG PO TABS
5.0000 mg | ORAL_TABLET | Freq: Two times a day (BID) | ORAL | Status: DC
  Administered 2021-07-16 – 2021-07-23 (×14): 5 mg via ORAL
  Filled 2021-07-16 (×6): qty 1
  Filled 2021-07-16: qty 2
  Filled 2021-07-16 (×7): qty 1

## 2021-07-16 MED ORDER — AMLODIPINE BESYLATE 5 MG PO TABS
5.0000 mg | ORAL_TABLET | Freq: Every day | ORAL | Status: DC
  Administered 2021-07-17 – 2021-07-18 (×2): 5 mg via ORAL
  Filled 2021-07-16 (×2): qty 1

## 2021-07-16 MED ORDER — FLUTICASONE PROPIONATE 50 MCG/ACT NA SUSP: 2.0000 | Freq: Every evening | NASAL | Status: DC | PRN

## 2021-07-16 MED ORDER — SODIUM CHLORIDE 0.9 % IV SOLN
2.0000 g | Freq: Once | INTRAVENOUS | Status: AC
  Administered 2021-07-16: 2 g via INTRAVENOUS
  Filled 2021-07-16: qty 20

## 2021-07-16 MED ORDER — SODIUM CHLORIDE 0.9 % IV SOLN
2.0000 g | INTRAVENOUS | Status: AC
  Administered 2021-07-17 – 2021-07-20 (×4): 2 g via INTRAVENOUS
  Filled 2021-07-16 (×4): qty 20

## 2021-07-16 NOTE — ED Notes (Signed)
Pt and bed completely changed after being soaked in urine. Pure wick replaced. Pt medicated and vitals updated. Pt has no complaints or requests at this time. Pt very thankful for staff as she states

## 2021-07-16 NOTE — ED Provider Notes (Signed)
[facility] EMERGENCY DEPARTMENT Provider Note   CSN: [id number] Arrival date & time: 07/16/21  1104     History Chief Complaint  Patient presents with   Dizziness    [name] is a 80 y.o. female.  80 yo F with a chief complaints of cough and fatigue.  This been going on for 2 or 3 days now.  She has been able to eat and drink.  Denies nausea vomiting or diarrhea.  Denies abdominal pain.  She has a wound to her left lower extremity that she feels like is getting better.  Denies any other wounds.  Felt a bit lightheaded when she tried to get up and walk around today.  The history is provided by the patient.  Dizziness Associated symptoms: no chest pain, no headaches, no nausea, no palpitations, no shortness of breath and no vomiting   Illness Severity:  Moderate Onset quality:  Gradual Duration:  2 days Timing:  Constant Progression:  Worsening Chronicity:  New Associated symptoms: no chest pain, no congestion, no fever, no headaches, no myalgias, no nausea, no rhinorrhea, no shortness of breath, no vomiting and no wheezing       Past Medical History:  Diagnosis Date   Arthritis    CHF (congestive heart failure) ([place])    05/2011 echo severe concentric LVH, normal systolic fxn, grade 1 diastolic dysfxn, paradoxical septal motion   Coronary artery disease    Cath 04/2006 normal coronaries, mild-mod slow flow in all coronaries with slow distall runoff, mild-mod LV enlargement, EF 55%    Depression    Dyspnea    Dysrhythmia    GERD (gastroesophageal reflux disease)    Hypercholesterolemia    Hyperlipemia    Hypertension    MI (myocardial infarction) ([place])    NOT SURE HOW MANY   Morbid obesity ([place])    Nocturia    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   Peripheral vascular disease ([place])    Pneumonia NONE RECENT   Sleep apnea    needs cpap but does not wear   Stroke [facility])    TIA (transient ischemic attack)    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    Vertigo    takes meclizine daily    Patient Active Problem List   Diagnosis Date Noted   PNA (pneumonia) 07/16/2021   Pressure injury of skin 09/20/2020   Acute on chronic heart failure with preserved ejection fraction (HFpEF) ([place]) 09/20/2020   Bacteremia due to Escherichia coli 09/19/2020   Cellulitis of right leg 07/25/2020   NSTEMI (non-ST elevated myocardial infarction) ([place]) 07/16/2020   Chronic diastolic CHF (congestive heart failure) ([place]) 07/16/2020   Paroxysmal atrial fibrillation ([place]) 07/16/2020   Hypotension 07/16/2020   Shortness of breath at rest 05/22/2020    Class: Acute   Palpitation 05/22/2020    Class: Acute   Shortness of breath 05/22/2020   Acute left systolic heart failure ([place]) 99991111   Acute systolic heart failure ([place]) 06/18/2019   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   Symptomatic sinus bradycardia 03/27/2014   Pacemaker at end of battery life 03/27/2014   CVA (cerebral infarction) 10/17/2012   Headache(784.0) 10/17/2012   Coronary artery disease    Hypertension    Hyperlipemia    Arthritis    CHF (congestive heart failure) (HCC)    Vertigo    TIA (transient ischemic attack)    Pacemaker    MI (myocardial infarction) ([place])  Morbid obesity [facility])     Past Surgical History:  Procedure Laterality Date   ABDOMINAL HYSTERECTOMY     CARDIAC CATHETERIZATION     CATARACT EXTRACTION W/PHACO Left 08/29/2013   Procedure: CATARACT EXTRACTION PHACO AND INTRAOCULAR LENS PLACEMENT ([place]);  Surgeon: Adonis Brook, MD;  Location: [place];  Service: Ophthalmology;  Laterality: Left;   COLONOSCOPY WITH PROPOFOL N/A 01/07/2014   Procedure: COLONOSCOPY WITH PROPOFOL;  Surgeon: Juanita Craver, MD;  Location: [facility] END[facility];  Service: Endoscopy;  Laterality: N/A;   CORONARY ANGIOGRAPHY N/A 05/27/2020   Procedure: CORONARY ANGIOGRAPHY;  Surgeon: Dixie Dials, MD;  Location: [place] CV LAB;  Service: Cardiovascular;  Laterality:  N/A;   DILATION AND CURETTAGE OF UTERUS     INSERT / REPLACE / REMOVE PACEMAKER     LEFT HEART CATH AND CORONARY ANGIOGRAPHY N/A 07/18/2020   Procedure: LEFT HEART CATH AND CORONARY ANGIOGRAPHY;  Surgeon: Dixie Dials, MD;  Location: [place] CV LAB;  Service: Cardiovascular;  Laterality: N/A;   PACEMAKER GENERATOR CHANGE N/A 03/27/2014   Procedure: PACEMAKER GENERATOR CHANGE;  Surgeon: Sanda Klein, MD;  Location: [place] CATH LAB;  Service: Cardiovascular;  Laterality: N/A;   PACEMAKER INSERTION     PACEMAKER INSERTION     TUBAL LIGATION     VASCULAR SURGERY     vericose vein surgery     OB History   No obstetric history on file.     No family history on file.  Social History   Tobacco Use   Smoking status: Never   Smokeless tobacco: Never  Vaping Use   Vaping Use: Never used  Substance Use Topics   Alcohol use: No   Drug use: No    Home Medications Prior to Admission medications   Medication Sig Start Date End Date Taking? Authorizing Provider  acetaminophen (TYLENOL) 500 MG tablet Take 1,000 mg by mouth every 6 (six) hours as needed for moderate pain.    [provider]  albuterol (VENTOLIN HFA) 108 (90 Base) MCG/ACT inhaler Inhale 2 puffs into the lungs every 6 (six) hours as needed for wheezing or shortness of breath.  10/12/19   [provider]  allopurinol (ZYLOPRIM) 100 MG tablet Take 100 mg by mouth daily.    [provider]  amiodarone (PACERONE) 200 MG tablet Take 200 mg by mouth daily.    [provider]  amLODipine (NORVASC) 5 MG tablet Take 1 tablet (5 mg total) by mouth daily. 09/25/20   Jose Persia, MD  apixaban (ELIQUIS) 5 MG TABS tablet Take 5 mg by mouth 2 (two) times daily. 02/11/17   [provider]  aspirin EC 81 MG tablet Take 81 mg by mouth daily.    [provider]  famotidine (PEPCID) 40 MG tablet Take 40 mg by mouth daily as needed for heartburn or indigestion.  11/29/19   [provider]  fluticasone (FLONASE) 50 MCG/ACT nasal spray Place 2 sprays into both nostrils at bedtime as needed for allergies or rhinitis.  07/08/19   [provider]  furosemide (LASIX) 40 MG tablet Take 40 mg by mouth in the morning.     [provider]  metoprolol tartrate (LOPRESSOR) 25 MG tablet Take 0.5 tablets (12.5 mg total) by mouth 2 (two) times daily. 07/25/20 09/19/20  Terrilee Croak, MD  nitroGLYCERIN (NITROSTAT) 0.4 MG SL tablet Place 0.4 mg under the tongue every 5 (five) minutes as needed for chest pain.     [provider]  oxybutynin (DITROPAN)  5 MG tablet Take 5 mg by mouth 2 (two) times daily.    [provider]  oxyCODONE-acetaminophen (PERCOCET/ROXICET) 5-325 MG tablet Take 1 tablet by mouth See admin instructions. Take 1 tablet by mouth two to three times a day 11/20/19   [provider]  pantoprazole (PROTONIX) 40 MG tablet Take 40 mg by mouth 2 (two) times daily.     [provider]  potassium chloride SA (KLOR-CON) 20 MEQ tablet Take 1 tablet (20 mEq total) by mouth daily. Patient taking differently: Take 20 mEq by mouth 2 (two) times daily.  07/25/20 09/19/20  Terrilee Croak, MD  rosuvastatin (CRESTOR) 10 MG tablet Take 10 mg by mouth daily.     [provider]    Allergies    Ampicillin, Penicillins, and Linzess [linaclotide]  Review of Systems   Review of Systems  Constitutional:  Negative for chills and fever.  HENT:  Negative for congestion and rhinorrhea.   Eyes:  Negative for redness and visual disturbance.  Respiratory:  Negative for shortness of breath and wheezing.   Cardiovascular:  Negative for chest pain and palpitations.  Gastrointestinal:  Negative for nausea and vomiting.  Genitourinary:  Negative for dysuria and urgency.  Musculoskeletal:  Negative for arthralgias and myalgias.  Skin:  Negative for pallor and wound.  Neurological:  Positive for dizziness. Negative for headaches.   Physical  Exam Updated Vital Signs BP (!) 147/72   Pulse 60   Temp 98.3 F (36.8 C)   Resp 18   SpO2 95%   Physical Exam Vitals and nursing note reviewed.  Constitutional:      General: She is not in acute distress.    Appearance: She is well-developed. She is not diaphoretic.     Comments: Morbidly obese  HENT:     Head: Normocephalic and atraumatic.  Eyes:     Pupils: Pupils are equal, round, and reactive to light.  Cardiovascular:     Rate and Rhythm: Normal rate and regular rhythm.     Heart sounds: No murmur heard.   No friction rub. No gallop.  Pulmonary:     Effort: Pulmonary effort is normal.     Breath sounds: Rhonchi present. No wheezing or rales.     Comments: Coarse breath sounds in all fields Abdominal:     General: There is no distension.     Palpations: Abdomen is soft.     Tenderness: There is no abdominal tenderness.  Musculoskeletal:        General: No tenderness.     Cervical back: Normal range of motion and neck supple.  Skin:    General: Skin is warm and dry.  Neurological:     Mental Status: She is alert and oriented to person, place, and time.  Psychiatric:        Behavior: Behavior normal.    ED Results / Procedures / Treatments   Labs (all labs ordered are listed, but only abnormal results are displayed) Labs Reviewed  CBC WITH DIFFERENTIAL/PLATELET - Abnormal; Notable for the following components:      Result Value   Hemoglobin 11.9 (*)    RDW 17.3 (*)    Neutro Abs 1.6 (*)    All other components within normal limits  COMPREHENSIVE METABOLIC PANEL - Abnormal; Notable for the following components:   Glucose, Bld 104 (*)    Creatinine, Ser 1.48 (*)    Calcium 8.6 (*)    Albumin 3.1 (*)    GFR, Estimated 36 (*)  All other components within normal limits  RESP PANEL BY RT-PCR (FLU A&B, COVID) ARPGX2  CULTURE, BLOOD (ROUTINE X 2)  CULTURE, BLOOD (ROUTINE X 2)  LACTIC ACID, PLASMA  URINALYSIS, ROUTINE W REFLEX MICROSCOPIC  LACTIC ACID,  PLASMA  CBG MONITORING, ED    EKG EKG Interpretation  Date/Time:  Thursday July 16 2021 11:14:48 EDT Ventricular Rate:  60 PR Interval:  37 QRS Duration: 197 QT Interval:  524 QTC Calculation: 524 R Axis:   -80 Text Interpretation: Sinus rhythm Short PR interval Left bundle branch block No significant change since last tracing Confirmed by Deno Etienne [phone]) on 07/16/2021 11:27:23 AM  Radiology DG Chest Port 1 View  Result Date: 07/16/2021 CLINICAL DATA:  Cough EXAM: PORTABLE CHEST 1 VIEW COMPARISON:  Chest x-ray dated September 19, 2020 FINDINGS: Enlarged cardiac and mediastinal contours are unchanged compared to prior. Right chest wall dual lead pacer with unchanged lead position. No focal lung opacity. Mild bilateral heterogeneous opacities. No large pleural effusion or pneumothorax. IMPRESSION: Mild bilateral heterogeneous opacities, possibly due to pulmonary edema versus atelectasis. Electronically Signed   By: Yetta Glassman M.D.   On: 07/16/2021 11:59    Procedures Procedures   Medications Ordered in ED Medications  cefTRIAXone (ROCEPHIN) 2 g in sodium chloride 0.9 % 100 mL IVPB (2 g Intravenous New Bag/Given 07/16/21 1356)  azithromycin (ZITHROMAX) 500 mg in sodium chloride 0.9 % 250 mL IVPB (has no administration in time range)  sodium chloride 0.9 % bolus 1,000 mL (1,000 mLs Intravenous New Bag/Given 07/16/21 1154)    ED Course  I have reviewed the triage vital signs and the nursing notes.  Pertinent labs & imaging results that were available during my care of the patient were reviewed by me and considered in my medical decision making (see chart for details).    MDM Rules/Calculators/A&P                           80 yo F with a cc of cough, fatigue, going on for about three days.  Patient with no known sick contacts.  Will obtain lab eval, fluids, cxr, ua, reassess.   Chest x-ray with a vague radiology read.  Multifocal opacities, patient with diffuse rhonchi  question multifocal pneumonia.  Will start on antibiotics.  Patient still feeling very fatigued and short of breath on repeat assessment.  Will discuss with medicine for admission.  The patients results and plan were reviewed and discussed.   Any x-rays performed were independently reviewed by myself.   Differential diagnosis were considered with the presenting HPI.  Medications  cefTRIAXone (ROCEPHIN) 2 g in sodium chloride 0.9 % 100 mL IVPB (2 g Intravenous New Bag/Given 07/16/21 1356)  azithromycin (ZITHROMAX) 500 mg in sodium chloride 0.9 % 250 mL IVPB (has no administration in time range)  sodium chloride 0.9 % bolus 1,000 mL (1,000 mLs Intravenous New Bag/Given 07/16/21 1154)    Vitals:   07/16/21 1230 07/16/21 1300 07/16/21 1330 07/16/21 1345  BP: 132/65 (!) 139/58 (!) 155/65 (!) 147/72  Pulse: 60 60 (!) 59 60  Resp: 16 16 (!) 22 18  Temp:      SpO2: 94% 95% 97% 95%    Final diagnoses:  Community acquired pneumonia, unspecified laterality    Admission/ observation were discussed with the admitting physician, patient and/or family and they are comfortable with the plan.   Final Clinical Impression(s) / ED Diagnoses Final diagnoses:  Community acquired pneumonia,  unspecified laterality    Rx / DC Orders ED Discharge Orders     None        Deno Etienne, DO 07/16/21 1414

## 2021-07-16 NOTE — ED Triage Notes (Signed)
Patient BIB [facility] from home with complaint of dizziness that primarily starts when standing and usually resolved by sitting back down. Patient also reports a cough that started four days ago. Patient has pacemaker since 2003. Patient is alert, oriented, and in no apparent distress at this time.

## 2021-07-16 NOTE — H&P (Addendum)
History and Physical    [name] [id number] DOB: 02/09/1941 DOA: 07/16/2021  PCP: Rocky Morel, MD (Confirm with patient/family/NH records and if not entered, this has to be entered at [facility] point of entry) Patient coming from: Home  I have personally briefly reviewed patient's old medical records in [place]  Chief Complaint: Cough, wheezing, shortness of breath.  HPI: [name] is a 80 y.o. female with medical history significant of chronic diastolic CHF, CKD stage IIb, PAF on Eliquis, sick sinus syndrome status post pacemaker, HTN, CAD, presented with new onset of cough, wheezing, shortness of breath.  Patient started to have productive cough 3 to 4 days ago, with light-colored sputum, and wheezing.  Increasing shortness of breath, worsening with exertions.  She has been using albuterol inhaler without significant improvement.  Denied any chest pains.  Denies any fever or chills.  Her weight has been stable, she also reported increasing of ankle swelling. ED Course: Patient in mild respiratory distress, chest x-ray showed suspicious bilateral lower field infiltrates, WBC 4.0, Creatinine 1.48 about her baseline.  IV antibiotics started in the ED.  Review of Systems: As per HPI otherwise 14 point review of systems negative.    Past Medical History:  Diagnosis Date   Arthritis    CHF (congestive heart failure) ([place])    05/2011 echo severe concentric LVH, normal systolic fxn, grade 1 diastolic dysfxn, paradoxical septal motion   Coronary artery disease    Cath 04/2006 normal coronaries, mild-mod slow flow in all coronaries with slow distall runoff, mild-mod LV enlargement, EF 55%    Depression    Dyspnea    Dysrhythmia    GERD (gastroesophageal reflux disease)    Hypercholesterolemia    Hyperlipemia    Hypertension    MI (myocardial infarction) ([place])    NOT SURE HOW MANY   Morbid obesity ([place])    Nocturia    Pacemaker 2003   For  second degree HB   Peripheral vascular disease ([place])    Pneumonia NONE RECENT   Sleep apnea    needs cpap but does not wear   Stroke [facility])    TIA (transient ischemic attack)    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    Vertigo    takes meclizine daily    Past Surgical History:  Procedure Laterality Date   ABDOMINAL HYSTERECTOMY     CARDIAC CATHETERIZATION     CATARACT EXTRACTION W/PHACO Left 08/29/2013   Procedure: CATARACT EXTRACTION PHACO AND INTRAOCULAR LENS PLACEMENT ([place]);  Surgeon: Adonis Brook, MD;  Location: [place];  Service: Ophthalmology;  Laterality: Left;   COLONOSCOPY WITH PROPOFOL N/A 01/07/2014   Procedure: COLONOSCOPY WITH PROPOFOL;  Surgeon: Juanita Craver, MD;  Location: [facility];  Service: Endoscopy;  Laterality: N/A;   CORONARY ANGIOGRAPHY N/A 05/27/2020   Procedure: CORONARY ANGIOGRAPHY;  Surgeon: Dixie Dials, MD;  Location: [place] CV LAB;  Service: Cardiovascular;  Laterality: N/A;   DILATION AND CURETTAGE OF UTERUS     INSERT / REPLACE / REMOVE PACEMAKER     LEFT HEART CATH AND CORONARY ANGIOGRAPHY N/A 07/18/2020   Procedure: LEFT HEART CATH AND CORONARY ANGIOGRAPHY;  Surgeon: Dixie Dials, MD;  Location: [place] CV LAB;  Service: Cardiovascular;  Laterality: N/A;   PACEMAKER GENERATOR CHANGE N/A 03/27/2014   Procedure: PACEMAKER GENERATOR CHANGE;  Surgeon: Sanda Klein, MD;  Location: [place] CATH LAB;  Service: Cardiovascular;  Laterality: N/A;   PACEMAKER INSERTION     PACEMAKER  INSERTION     TUBAL LIGATION     VASCULAR SURGERY     vericose vein surgery     reports that she has never smoked. She has never used smokeless tobacco. She reports that she does not drink alcohol and does not use drugs.  Allergies  Allergen Reactions   Ampicillin Other (See Comments)    Increases heart rhythm  Has tolerated ceftriaxone in the past - 06/2020   Penicillins Other (See Comments)    Increases heart rhythm    Linzess [Linaclotide] Other  (See Comments)    Severe stomach pains    No family history on file.   Prior to Admission medications   Medication Sig Start Date End Date Taking? Authorizing Provider  acetaminophen (TYLENOL) 500 MG tablet Take 1,000 mg by mouth every 6 (six) hours as needed for moderate pain.    [provider]  albuterol (VENTOLIN HFA) 108 (90 Base) MCG/ACT inhaler Inhale 2 puffs into the lungs every 6 (six) hours as needed for wheezing or shortness of breath.  10/12/19   [provider]  allopurinol (ZYLOPRIM) 100 MG tablet Take 100 mg by mouth daily.    [provider]  amiodarone (PACERONE) 200 MG tablet Take 200 mg by mouth daily.    [provider]  amLODipine (NORVASC) 5 MG tablet Take 1 tablet (5 mg total) by mouth daily. 09/25/20   Jose Persia, MD  apixaban (ELIQUIS) 5 MG TABS tablet Take 5 mg by mouth 2 (two) times daily. 02/11/17   [provider]  aspirin EC 81 MG tablet Take 81 mg by mouth daily.    [provider]  famotidine (PEPCID) 40 MG tablet Take 40 mg by mouth daily as needed for heartburn or indigestion.  11/29/19   [provider]  fluticasone (FLONASE) 50 MCG/ACT nasal spray Place 2 sprays into both nostrils at bedtime as needed for allergies or rhinitis.  07/08/19   [provider]  furosemide (LASIX) 40 MG tablet Take 40 mg by mouth in the morning.     [provider]  metoprolol tartrate (LOPRESSOR) 25 MG tablet Take 0.5 tablets (12.5 mg total) by mouth 2 (two) times daily. 07/25/20 09/19/20  Terrilee Croak, MD  nitroGLYCERIN (NITROSTAT) 0.4 MG SL tablet Place 0.4 mg under the tongue every 5 (five) minutes as needed for chest pain.     [provider]  oxybutynin (DITROPAN) 5 MG tablet Take 5 mg by mouth 2 (two) times daily.    [provider]  oxyCODONE-acetaminophen (PERCOCET/ROXICET) 5-325 MG tablet Take 1 tablet by mouth See admin instructions. Take 1 tablet by mouth two to three  times a day 11/20/19   [provider]  pantoprazole (PROTONIX) 40 MG tablet Take 40 mg by mouth 2 (two) times daily.     [provider]  potassium chloride SA (KLOR-CON) 20 MEQ tablet Take 1 tablet (20 mEq total) by mouth daily. Patient taking differently: Take 20 mEq by mouth 2 (two) times daily.  07/25/20 09/19/20  Terrilee Croak, MD  rosuvastatin (CRESTOR) 10 MG tablet Take 10 mg by mouth daily.     [provider]    Physical Exam: Vitals:   07/16/21 1230 07/16/21 1300 07/16/21 1330 07/16/21 1345  BP: 132/65 (!) 139/58 (!) 155/65 (!) 147/72  Pulse: 60 60 (!) 59 60  Resp: 16 16 (!) 22 18  Temp:      SpO2: 94% 95% 97% 95%    Constitutional: NAD, calm, comfortable  Vitals:   07/16/21 1230 07/16/21 1300 07/16/21 1330 07/16/21 1345  BP: 132/65 (!) 139/58 (!) 155/65 (!) 147/72  Pulse: 60 60 (!) 59 60  Resp: 16 16 (!) 22 18  Temp:      SpO2: 94% 95% 97% 95%   Eyes: PERRL, lids and conjunctivae normal ENMT: Mucous membranes are moist. Posterior pharynx clear of any exudate or lesions.Normal dentition.  Neck: normal, supple, no masses, no thyromegaly Respiratory: clear to auscultation bilaterally, diffused wheezing, right lower field crackles.  Increasing respiratory effort. No accessory muscle use.  Cardiovascular: Regular rate and rhythm, no murmurs / rubs / gallops. 1+ extremity edema. 2+ pedal pulses. No carotid bruits.  Abdomen: no tenderness, no masses palpated. No hepatosplenomegaly. Bowel sounds positive.  Musculoskeletal: no clubbing / cyanosis. No joint deformity upper and lower extremities. Good ROM, no contractures. Normal muscle tone.  Skin: no rashes, lesions, ulcers. No induration Neurologic: CN 2-12 grossly intact. Sensation intact, DTR normal. Strength 5/5 in all 4.  Psychiatric: Normal judgment and insight. Alert and oriented x 3. Normal mood.     Labs on Admission: I have personally reviewed following labs and imaging  studies  CBC: Recent Labs  Lab 07/16/21 1138  WBC 4.0  NEUTROABS 1.6*  HGB 11.9*  HCT 38.7  MCV 97.7  PLT 123456   Basic Metabolic Panel: Recent Labs  Lab 07/16/21 1138  NA 140  K 4.0  CL 107  CO2 28  GLUCOSE 104*  BUN 14  CREATININE 1.48*  CALCIUM 8.6*   GFR: CrCl cannot be calculated (Unknown ideal weight.). Liver Function Tests: Recent Labs  Lab 07/16/21 1138  AST 20  ALT 10  ALKPHOS 91  BILITOT 0.6  PROT 7.6  ALBUMIN 3.1*   No results for input(s): LIPASE, AMYLASE in the last 168 hours. No results for input(s): AMMONIA in the last 168 hours. Coagulation Profile: No results for input(s): INR, PROTIME in the last 168 hours. Cardiac Enzymes: No results for input(s): CKTOTAL, CKMB, CKMBINDEX, TROPONINI in the last 168 hours. BNP (last 3 results) No results for input(s): PROBNP in the last 8760 hours. HbA1C: No results for input(s): HGBA1C in the last 72 hours. CBG: Recent Labs  Lab 07/16/21 1118  GLUCAP 98   Lipid Profile: No results for input(s): CHOL, HDL, LDLCALC, TRIG, CHOLHDL, LDLDIRECT in the last 72 hours. Thyroid Function Tests: No results for input(s): TSH, T4TOTAL, FREET4, T3FREE, THYROIDAB in the last 72 hours. Anemia Panel: No results for input(s): VITAMINB12, FOLATE, FERRITIN, TIBC, IRON, RETICCTPCT in the last 72 hours. Urine analysis:    Component Value Date/Time   COLORURINE STRAW (A) 09/19/2020 1700   APPEARANCEUR CLEAR 09/19/2020 1700   LABSPEC 1.005 09/19/2020 1700   PHURINE 7.0 09/19/2020 1700   GLUCOSEU NEGATIVE 09/19/2020 1700   HGBUR SMALL (A) 09/19/2020 1700   BILIRUBINUR NEGATIVE 09/19/2020 1700   KETONESUR NEGATIVE 09/19/2020 1700   PROTEINUR NEGATIVE 09/19/2020 1700   UROBILINOGEN 0.2 02/07/2013 0203   NITRITE NEGATIVE 09/19/2020 1700   LEUKOCYTESUR MODERATE (A) 09/19/2020 1700    Radiological Exams on Admission: DG Chest Port 1 View  Result Date: 07/16/2021 CLINICAL DATA:  Cough EXAM: PORTABLE CHEST 1 VIEW  COMPARISON:  Chest x-ray dated September 19, 2020 FINDINGS: Enlarged cardiac and mediastinal contours are unchanged compared to prior. Right chest wall dual lead pacer with unchanged lead position. No focal lung opacity. Mild bilateral heterogeneous opacities. No large pleural effusion or pneumothorax. IMPRESSION: Mild bilateral heterogeneous opacities, possibly due to pulmonary edema versus  atelectasis. Electronically Signed   By: Yetta Glassman M.D.   On: 07/16/2021 11:59    EKG: Independently reviewed.  Sinus, chronic LBBB  Assessment/Plan Active Problems:   PNA (pneumonia)   Pneumonia  (please populate well all problems here in Problem List. (For example, if patient is on BP meds at home and you resume or decide to hold them, it is a problem that needs to be her. Same for CAD, COPD, HLD and so on)  CAP -Evidenced by new onset of cough, shortness of breath and new infiltrates on x-ray.  Blood work showed slight decrease of neutrophils, appears indicating atypical pneumonia.  Sent atypical pneumonia work-up.  Continue ceftriaxone and azithromycin for now. CURB65=1. -Appears to have concurrent acute bronchitis, start short course of p.o. steroid, along with inhaled steroid and DuoNeb.  Pulmonary toiletry.  Acute bronchitis -As above.  Acute on chronic diastolic CHF decompensation -Volume status extremely hard to evaluate due to body habitus/morbid obesity, her weight today remains the same as compared to last years, but given increasing of ankle swelling, gave 1 dose of IV Lasix 40 mg, resume p.o. Lasix 40 mg tomorrow. -Blood pressure fairly controlled, continue current home BP regimen. -BNP, if normal, outpatient Echo and cardiology F/U.  HTN -As above.  PAF -Sinus rhythm, denied any palpitations, continue Eliquis and metoprolol and amiodarone.  CKD stage IIIb -Creatinine level stable, probably slightly fluid overload, 1 dose of IV Lasix, repeat BMP in AM.  Morbid  obesity -Outpatient pulmonology to rule out OSA -Calorie control recommended.   DVT prophylaxis: Eliquis Code Status: Full code Family Communication: None at bedside Disposition Plan: Expect more than 2 midnight hospital stay Consults called: None Admission status: Tele admit   Lequita Halt MD [facility] Pager [phone]  07/16/2021, 2:20 PM

## 2021-07-16 NOTE — ED Notes (Signed)
Report emailed to floor nurse

## 2021-07-17 DIAGNOSIS — Z6841 Body Mass Index (BMI) 40.0 and over, adult: Secondary | ICD-10-CM

## 2021-07-17 DIAGNOSIS — I5033 Acute on chronic diastolic (congestive) heart failure: Secondary | ICD-10-CM | POA: Diagnosis not present

## 2021-07-17 DIAGNOSIS — J189 Pneumonia, unspecified organism: Secondary | ICD-10-CM | POA: Diagnosis not present

## 2021-07-17 MED ORDER — BUDESONIDE 0.5 MG/2ML IN SUSP
0.5000 mg | Freq: Two times a day (BID) | RESPIRATORY_TRACT | Status: DC
  Administered 2021-07-17 – 2021-07-23 (×11): 0.5 mg via RESPIRATORY_TRACT
  Filled 2021-07-17 (×12): qty 2

## 2021-07-17 MED ORDER — IPRATROPIUM-ALBUTEROL 0.5-2.5 (3) MG/3ML IN SOLN
3.0000 mL | Freq: Three times a day (TID) | RESPIRATORY_TRACT | Status: DC
  Administered 2021-07-17 (×2): 3 mL via RESPIRATORY_TRACT
  Filled 2021-07-17 (×2): qty 3

## 2021-07-17 MED ORDER — POLYETHYLENE GLYCOL 3350 17 G PO PACK
17.0000 g | PACK | Freq: Every day | ORAL | Status: DC | PRN
  Administered 2021-07-17: 17 g via ORAL
  Filled 2021-07-17: qty 1

## 2021-07-17 MED ORDER — IPRATROPIUM-ALBUTEROL 0.5-2.5 (3) MG/3ML IN SOLN: 3.0000 mL | Freq: Two times a day (BID) | RESPIRATORY_TRACT | Status: DC

## 2021-07-17 MED ORDER — DOCUSATE SODIUM 100 MG PO CAPS
100.0000 mg | ORAL_CAPSULE | Freq: Two times a day (BID) | ORAL | Status: DC
  Administered 2021-07-17 – 2021-07-23 (×12): 100 mg via ORAL
  Filled 2021-07-17 (×13): qty 1

## 2021-07-17 MED ORDER — IPRATROPIUM-ALBUTEROL 0.5-2.5 (3) MG/3ML IN SOLN
3.0000 mL | Freq: Three times a day (TID) | RESPIRATORY_TRACT | Status: DC
  Administered 2021-07-17: 3 mL via RESPIRATORY_TRACT
  Filled 2021-07-17: qty 3

## 2021-07-17 NOTE — TOC Progression Note (Signed)
Transition of Care [facility]) - Progression Note    Patient Details  Name: [name] MRN: [id number] Date of Birth: [birth year]  Transition [facility]) CM/SW Contact  Zenon Mayo, RN Phone Number: 07/17/2021, 4:00 PM  Clinical Narrative:    NCM spoke with patient at the bedside, offered choice she states she had [facility] before and really like them,  NCM made referral to [facility] with [place].  Patient states she needs a rolling walker as well, she is ok with [employer] supply to rolling walker.        Expected Discharge Plan and Services                                                 Social Determinants of Health (SDOH) Interventions    Readmission Risk Interventions Readmission Risk Prevention Plan 05/28/2020  Transportation Screening Complete  PCP or Specialist Appt within 3-5 Days Complete  HRI or [place] Complete  Social Work Consult for [place] Planning/Counseling Complete  Palliative Care Screening Not Applicable  Medication Review [employer]) Complete  Some recent data might be hidden

## 2021-07-17 NOTE — Progress Notes (Signed)
PROGRESS NOTE    [name]  [id number] DOB: [birth year] DOA: 07/16/2021 PCP: Rocky Morel, MD    Chief Complaint  Patient presents with   Dizziness    Brief admission narrative:  As per H&P written by Dr. Roosevelt Locks on 07/16/21 [name] is a 80 y.o. female with medical history significant of chronic diastolic CHF, CKD stage IIb, PAF on Eliquis, sick sinus syndrome status post pacemaker, HTN, CAD, presented with new onset of cough, wheezing, shortness of breath.   Patient started to have productive cough 3 to 4 days ago, with light-colored sputum, and wheezing.  Increasing shortness of breath, worsening with exertions.  She has been using albuterol inhaler without significant improvement.  Denied any chest pains.  Denies any fever or chills.  Her weight has been stable, she also reported increasing of ankle swelling. ED Course: Patient in mild respiratory distress, chest x-ray showed suspicious bilateral lower field infiltrates, WBC 4.0, Creatinine 1.48 about her baseline.  IV antibiotics started in the ED.  Assessment & Plan: 1-acute on chronic diastolic heart failure -Patient presenting with orthopnea, increased lower extremity edema, shortness of breath on exertion and fine crackles on examination. -Continue IV diuresis, daily weights and strict I's and O's -Low-sodium diet has been encouraged -Follow clinical response and close electrolytes/renal function monitoring.  2-acute bronchitis/early community-acquired pneumonia -Bilateral lower field infiltrates and positive rhonchi/productive cough on presentation. -No fever, normal WBCs -Continue treatment with bronchodilators, steroids and antibiotics.  3-chronic kidney disease stage IIIb -Appears to be essentially at baseline -Continue to closely follow renal function trend with active diuresis -Maintain adequate hydration -Close monitoring of patient electrolytes -Avoid the use of nephrotoxic  agents, contrast and hypotension.  4-paroxysmal atrial fibrillation -Patient with history of sick sinus syndrome status post pacemaker -Rate controlled -Continue Eliquis for secondary prevention.  5-hypertension -Continue current antihypertensive regimen -Vital signs stable.  6-history of coronary artery disease -No chest pain -Continue the use of beta-blocker and statins  7-morbid obesity -Body mass index is 58.18 kg/m. -Last 3 days on patient's BMI -Low calorie diet, portion control and increase physical activity discussed with patient.   DVT prophylaxis: Chronically on Eliquis. Code Status: Full code Family Communication: No family at bedside. Disposition:   Status is: Inpatient  Remains inpatient appropriate because:IV treatments appropriate due to intensity of illness or inability to take PO  Dispo: The patient is from: Home              Anticipated d/c is to: Home              Patient currently is not medically stable to d/c.   Difficult to place patient No       Consultants:  None  Procedures:  See below for x-ray reports.  Antimicrobials:  Rocephin and Zithromax.   Subjective: Afebrile, no chest pain, no nausea or vomiting.  Still complaining of orthopnea, shortness of breath with exertion and intermittent productive coughing spells.  No requiring oxygen supplementation at rest.  Patient reports good urine output.  Objective: Vitals:   07/17/21 0810 07/17/21 0931 07/17/21 1123 07/17/21 1500  BP:  (!) 119/55 136/65   Pulse:  (!) 59 (!) 59 60  Resp:  '18 19 20  '$ Temp:  98 F (36.7 C) 98.1 F (36.7 C)   TempSrc:      SpO2: 92% 92% 93% 92%  Weight:      Height:        Intake/Output Summary (Last 24 hours) at  07/17/2021 1551 Last data filed at 07/17/2021 1029 Gross per 24 hour  Intake 820 ml  Output 850 ml  Net -30 ml   Filed Weights   07/16/21 1400 07/16/21 1900 07/17/21 0600  Weight: (!) 150 kg (!) 167.3 kg (!) 168.5 kg     Examination:  General exam: Complaining of shortness of breath with minimal exertion, positive orthopnea and intermittent coughing spells. Respiratory system: Fine crackles at the bases, positive expiratory wheezing and positive rhonchi. Cardiovascular system: Rate controlled, no rubs, no gallops, unable to properly assess JVD with body habitus.  1-2+ edema bilaterally Gastrointestinal system: Abdomen is obese, nondistended, soft and nontender. No organomegaly or masses felt. Normal bowel sounds heard. Central nervous system: Alert and oriented. No focal neurological deficits. Extremities: No cyanosis or clubbing. Skin: No petechiae. Psychiatry: Judgement and insight appear normal. Mood & affect appropriate.     Data Reviewed: I have personally reviewed following labs and imaging studies  CBC: Recent Labs  Lab 07/16/21 1138  WBC 4.0  NEUTROABS 1.6*  HGB 11.9*  HCT 38.7  MCV 97.7  PLT 123456    Basic Metabolic Panel: Recent Labs  Lab 07/16/21 1138  NA 140  K 4.0  CL 107  CO2 28  GLUCOSE 104*  BUN 14  CREATININE 1.48*  CALCIUM 8.6*    GFR: Estimated Creatinine Clearance: 52.5 mL/min (A) (by C-G formula based on SCr of 1.48 mg/dL (H)).  Liver Function Tests: Recent Labs  Lab 07/16/21 1138  AST 20  ALT 10  ALKPHOS 91  BILITOT 0.6  PROT 7.6  ALBUMIN 3.1*    CBG: Recent Labs  Lab 07/16/21 1118  GLUCAP 98     Recent Results (from the past 240 hour(s))  Resp Panel by RT-PCR (Flu A&B, Covid) Nasopharyngeal Swab     Status: None   Collection Time: 07/16/21 11:46 AM   Specimen: Nasopharyngeal Swab; Nasopharyngeal(NP) swabs in vial transport medium  Result Value Ref Range Status   SARS Coronavirus 2 by RT PCR NEGATIVE NEGATIVE Final    Comment: (NOTE) SARS-CoV-2 target nucleic acids are NOT DETECTED.  The SARS-CoV-2 RNA is generally detectable in upper respiratory specimens during the acute phase of infection. The lowest concentration of SARS-CoV-2  viral copies this assay can detect is 138 copies/mL. A negative result does not preclude SARS-Cov-2 infection and should not be used as the sole basis for treatment or other patient management decisions. A negative result may occur with  improper specimen collection/handling, submission of specimen other than nasopharyngeal swab, presence of viral mutation(s) within the areas targeted by this assay, and inadequate number of viral copies(<138 copies/mL). A negative result must be combined with clinical observations, patient history, and epidemiological information. The expected result is Negative.  Fact Sheet for Patients:  EntrepreneurPulse.com.au  Fact Sheet for Healthcare Providers:  IncredibleEmployment.be  This test is no t yet approved or cleared by the [place] FDA and  has been authorized for detection and/or diagnosis of SARS-CoV-2 by FDA under an Emergency Use Authorization (EUA). This EUA will remain  in effect (meaning this test can be used) for the duration of the COVID-19 declaration under Section 564(b)(1) of the Act, 21 U.S.C.section 360bbb-3(b)(1), unless the authorization is terminated  or revoked sooner.       Influenza A by PCR NEGATIVE NEGATIVE Final   Influenza B by PCR NEGATIVE NEGATIVE Final    Comment: (NOTE) The Xpert Xpress SARS-CoV-2/FLU/RSV plus assay is intended as an aid in the diagnosis of influenza  from Nasopharyngeal swab specimens and should not be used as a sole basis for treatment. Nasal washings and aspirates are unacceptable for Xpert Xpress SARS-CoV-2/FLU/RSV testing.  Fact Sheet for Patients: EntrepreneurPulse.com.au  Fact Sheet for Healthcare Providers: IncredibleEmployment.be  This test is not yet approved or cleared by the [place] FDA and has been authorized for detection and/or diagnosis of SARS-CoV-2 by FDA under an Emergency Use Authorization  (EUA). This EUA will remain in effect (meaning this test can be used) for the duration of the COVID-19 declaration under Section 564(b)(1) of the Act, 21 U.S.C. section 360bbb-3(b)(1), unless the authorization is terminated or revoked.  Performed at [facility], [place] [address] [place], [place] [address]       Radiology Studies: DG Chest Port 1 View  Result Date: 07/16/2021 CLINICAL DATA:  Cough EXAM: PORTABLE CHEST 1 VIEW COMPARISON:  Chest x-ray dated September 19, 2020 FINDINGS: Enlarged cardiac and mediastinal contours are unchanged compared to prior. Right chest wall dual lead pacer with unchanged lead position. No focal lung opacity. Mild bilateral heterogeneous opacities. No large pleural effusion or pneumothorax. IMPRESSION: Mild bilateral heterogeneous opacities, possibly due to pulmonary edema versus atelectasis. Electronically Signed   By: Yetta Glassman M.D.   On: 07/16/2021 11:59     Scheduled Meds:  allopurinol  100 mg Oral Daily   amiodarone  200 mg Oral Daily   amLODipine  5 mg Oral Daily   apixaban  5 mg Oral BID   aspirin EC  81 mg Oral Daily   azithromycin  500 mg Oral Daily   budesonide (PULMICORT) nebulizer solution  0.5 mg Nebulization BID   docusate sodium  100 mg Oral BID   furosemide  40 mg Oral q AM   guaiFENesin  1,200 mg Oral BID   ipratropium-albuterol  3 mL Nebulization TID   metoprolol tartrate  12.5 mg Oral BID   oxybutynin  5 mg Oral BID   pantoprazole  40 mg Oral BID   predniSONE  40 mg Oral Q breakfast   rosuvastatin  10 mg Oral Daily   Continuous Infusions:  cefTRIAXone (ROCEPHIN)  IV 2 g (07/17/21 1500)     LOS: 1 day    Time spent: 35 minutes    Barton Dubois, MD [facility]   To contact the attending provider between 7A-7P or the covering provider during after hours 7P-7A, please log into the web site [email or web address] and access using universal [place] password for that web site. If you do not have the password,  please call the hospital operator.  07/17/2021, 3:51 PM

## 2021-07-17 NOTE — TOC Progression Note (Addendum)
Transition of Care [facility]) - Progression Note    Patient Details  Name: ASTRAEA GALELLA MRN: [id number] Date of Birth: Jan 16, 1941  Transition of Care [facility]) CM/SW Contact  Zenon Mayo, RN Phone Number: 07/17/2021, 4:16 PM  Clinical Narrative:    NCM spoke with patient at bedside offered choice, [facility], [facility] given to [facility] ,she is able to take referral. Information faxed to [phone].  Patient states she also needs a rolling walker (HD), she is ok with [employer] supplying this.  NCM made referral to [facility] with [facility]. She states she will have transport at discharge. Shelia with [employer] says patient already has a rolling walker so they can not supply another one.    Expected Discharge Plan: [place] Barriers to Discharge: Continued Medical Work up  Expected Discharge Plan and Services Expected Discharge Plan: [place]   Discharge Planning Services: CM Consult Post Acute Care Choice: Durable Medical Equipment, Home Health Living arrangements for the past 2 months: Single Family Home                 DME Arranged: Walker rolling DME Agency: [employer] Date DME Agency Contacted: 07/17/21 Time DME Agency Contacted: [phone] Representative spoke with at DME Agency: [place]: PT, RN, Disease Management [place] Agency: [place] Date [place]: 07/17/21 Time [place]: 1615 Representative spoke with at [place]: [place] ([place]) Interventions    Readmission Risk Interventions Readmission Risk Prevention Plan 05/28/2020  Transportation Screening Complete  PCP or Specialist Appt within 3-5 Days Complete  HRI or [place] Complete  Social Work Consult for [place] Planning/Counseling [place] Not Applicable  Medication Review Press photographer) Complete  Some recent data might be hidden

## 2021-07-17 NOTE — Evaluation (Signed)
Physical Therapy Evaluation Patient Details Name: [name] MRN: [id number] DOB: 07/28/1941 Today's Date: 07/17/2021  History of Present Illness  80 y.o. female presented to ED 07/16/21 with new onset of cough, wheezing, shortness of breath.  In ED chest x-ray showed suspicious bilateral lower field infiltrates. IV antibiotics administered and pt admitted for treatment of CAP and acute on chronic diastolic CHP PMH: of chronic diastolic CHF, CKD stage IIb, PAF on Eliquis, sick sinus syndrome status post pacemaker, HTN, CAD,  Clinical Impression  PTA pt living with great granddaughter in single story home with 2 steps to enter. Granddaughter works during the day, pt has HHAide 1.5 hrs/5day/wk. Pt reports she uses RW in home, and wheelchair in community, [facility] assist for bathing and dressing and granddaughter provides for iADLs. Pt is currently limited in safe mobility by decreased strength, balance and endurance. Pt is min guard for bed mobility, min A for transfers and short distance ambulation with RW. PT recommends HHPT at discharge to improve mobility in her home environment. PT will continue to follow acutely.       Recommendations for follow up therapy are one component of a multi-disciplinary discharge planning process, led by the attending physician.  Recommendations may be updated based on patient status, additional functional criteria and insurance authorization.  Follow Up Recommendations Home health PT;Supervision/Assistance - 24 hour    Equipment Recommendations  None recommended by PT (has necessary equipment)    Recommendations for Other Services OT consult     Precautions / Restrictions        Mobility  Bed Mobility Overal bed mobility: Needs Assistance Bed Mobility: Supine to Sit     Supine to sit: Min guard;HOB elevated     General bed mobility comments: min guard for safety, increased time and effort    Transfers Overall transfer level: Needs  assistance Equipment used: 1 person hand held assist;Rolling walker (2 wheeled) Transfers: Stand Pivot Transfers;Sit to/from Stand Sit to Stand: Min assist Stand pivot transfers: Min guard       General transfer comment: min A for steadying with pivot transfer from bed to [facility]. Use RW to get up from [facility] with min guard  Ambulation/Gait Ambulation/Gait assistance: Min assist Gait Distance (Feet): 3 Feet Assistive device: Rolling walker (2 wheeled) Gait Pattern/deviations: Step-through pattern;Decreased step length - right;Decreased step length - left;Shuffle;Trunk flexed Gait velocity: slowed Gait velocity interpretation: <1.31 ft/sec, indicative of household ambulator General Gait Details: contact guard assist for ambulating 3 feet from [facility] to recliner      Balance Overall balance assessment: Needs assistance Sitting-balance support: Feet supported;No upper extremity supported Sitting balance-Leahy Scale: Fair     Standing balance support: Bilateral upper extremity supported Standing balance-Leahy Scale: Poor                               Pertinent Vitals/Pain Pain Assessment: 0-10 Pain Score: 7  Pain Location: bilateral LE and knees Pain Descriptors / Indicators: Aching;Throbbing Pain Intervention(s): Limited activity within patient's tolerance;Monitored during session;Premedicated before session;Repositioned    Home Living Family/patient expects to be discharged to:: Private residence Living Arrangements: Other relatives (great granddaughter) Available Help at Discharge: Family;Personal care attendant;Available PRN/intermittently (HHAide 5x wk/ 1.5) Type of Home: House Home Access: Stairs to enter Entrance Stairs-Rails: None Entrance Stairs-Number of Steps: 2 Home Layout: One level Home Equipment: Walker - 2 wheels;Wheelchair - [employer];Shower seat;Hand held shower head;Grab bars - tub/shower  Prior Function Level of Independence: Needs  assistance   Gait / Transfers Assistance Needed: RW for household distances. W/c for longer distances  ADL's / Homemaking Assistance Needed: has Aide 1.5 hrs/5day/wk for bathing and dressing greatgranddaughter does iADLs.        Hand Dominance   Dominant Hand: Right    Extremity/Trunk Assessment   Upper Extremity Assessment Upper Extremity Assessment: Generalized weakness    Lower Extremity Assessment Lower Extremity Assessment: Generalized weakness (ROM limited by body habitus)       Communication   Communication: No difficulties  Cognition Arousal/Alertness: Awake/alert Behavior During Therapy: WFL for tasks assessed/performed Overall Cognitive Status: Within Functional Limits for tasks assessed                                        General Comments General comments (skin integrity, edema, etc.): VSS on RA        Assessment/Plan    PT Assessment Patient needs continued PT services  PT Problem List Decreased strength;Decreased range of motion;Decreased activity tolerance;Decreased balance;Decreased mobility;Decreased coordination;Pain       PT Treatment Interventions DME instruction;Gait training;Functional mobility training;Therapeutic activities;Therapeutic exercise;Balance training;Cognitive remediation;Patient/family education;Stair training    PT Goals (Current goals can be found in the Care Plan section)  Acute Rehab PT Goals Patient Stated Goal: get back home to walk to back porch to sit outside PT Goal Formulation: With patient Time For Goal Achievement: 07/31/21 Potential to Achieve Goals: Fair    Frequency Min 3X/week   Barriers to discharge Decreased caregiver support         AM-PAC PT "6 Clicks" Mobility  Outcome Measure Help needed turning from your back to your side while in a flat bed without using bedrails?: None Help needed moving from lying on your back to sitting on the side of a flat bed without using bedrails?:  None Help needed moving to and from a bed to a chair (including a wheelchair)?: A Little Help needed standing up from a chair using your arms (e.g., wheelchair or bedside chair)?: A Little Help needed to walk in hospital room?: A Little Help needed climbing 3-5 steps with a railing? : A Lot 6 Click Score: 19    End of Session   Activity Tolerance: Patient tolerated treatment well Patient left: in chair;with call bell/phone within reach Nurse Communication: Mobility status PT Visit Diagnosis: Unsteadiness on feet (R26.81);Other abnormalities of gait and mobility (R26.89);Muscle weakness (generalized) (M62.81);Difficulty in walking, not elsewhere classified (R26.2);Pain Pain - Right/Left:  (bilateral) Pain - part of body: Leg    Time: [id number] PT Time Calculation (min) (ACUTE ONLY): 30 min   Charges:   PT Evaluation $PT Eval Moderate Complexity: 1 Mod PT Treatments $Therapeutic Activity: 8-22 mins        Antanette Richwine B. Migdalia Dk PT, DPT Acute Rehabilitation Services Pager [phone] Office [phone]   [place] 07/17/2021, 2:57 PM

## 2021-07-17 NOTE — Plan of Care (Signed)
  Problem: Elimination: Goal: Will not experience complications related to urinary retention Outcome: Progressing   Problem: Pain Managment: Goal: General experience of comfort will improve Outcome: Progressing   Problem: Safety: Goal: Ability to remain free from injury will improve Outcome: Progressing   

## 2021-07-18 DIAGNOSIS — N1831 Chronic kidney disease, stage 3a: Secondary | ICD-10-CM

## 2021-07-18 DIAGNOSIS — J189 Pneumonia, unspecified organism: Secondary | ICD-10-CM | POA: Diagnosis not present

## 2021-07-18 DIAGNOSIS — I480 Paroxysmal atrial fibrillation: Secondary | ICD-10-CM

## 2021-07-18 DIAGNOSIS — I2510 Atherosclerotic heart disease of native coronary artery without angina pectoris: Secondary | ICD-10-CM

## 2021-07-18 DIAGNOSIS — I872 Venous insufficiency (chronic) (peripheral): Secondary | ICD-10-CM | POA: Diagnosis not present

## 2021-07-18 DIAGNOSIS — I447 Left bundle-branch block, unspecified: Secondary | ICD-10-CM

## 2021-07-18 DIAGNOSIS — R5381 Other malaise: Secondary | ICD-10-CM

## 2021-07-18 DIAGNOSIS — I5033 Acute on chronic diastolic (congestive) heart failure: Secondary | ICD-10-CM | POA: Diagnosis not present

## 2021-07-18 LAB — PROCALCITONIN: Procalcitonin: <0.1 ng/mL

## 2021-07-18 LAB — MYCOPLASMA PNEUMONIAE ANTIBODY, IGM: Mycoplasma pneumo IgM: <770 U/mL (ref 0–769)

## 2021-07-18 MED ORDER — SENNOSIDES-DOCUSATE SODIUM 8.6-50 MG PO TABS
1.0000 | ORAL_TABLET | Freq: Two times a day (BID) | ORAL | Status: DC | PRN
  Filled 2021-07-18: qty 1

## 2021-07-18 MED ORDER — POLYETHYLENE GLYCOL 3350 17 G PO PACK
17.0000 g | PACK | Freq: Two times a day (BID) | ORAL | Status: DC | PRN
  Administered 2021-07-19: 17 g via ORAL
  Filled 2021-07-18: qty 1

## 2021-07-18 MED ORDER — FUROSEMIDE 10 MG/ML IJ SOLN
40.0000 mg | Freq: Two times a day (BID) | INTRAMUSCULAR | Status: DC
  Administered 2021-07-18 – 2021-07-21 (×7): 40 mg via INTRAVENOUS
  Filled 2021-07-18 (×7): qty 4

## 2021-07-18 MED ORDER — IPRATROPIUM-ALBUTEROL 0.5-2.5 (3) MG/3ML IN SOLN
3.0000 mL | Freq: Two times a day (BID) | RESPIRATORY_TRACT | Status: DC
  Administered 2021-07-18 – 2021-07-19 (×4): 3 mL via RESPIRATORY_TRACT
  Filled 2021-07-18 (×5): qty 3

## 2021-07-18 NOTE — Progress Notes (Signed)
PROGRESS NOTE  [name] [id number] DOB: September 07, 1941   PCP: Rocky Morel, MD  Patient is from: Home.  Lives with his great granddaughter.  Uses walker at baseline.  DOA: 07/16/2021 LOS: 2  Chief complaints:  Chief Complaint  Patient presents with   Dizziness     Brief Narrative / Interim history: 80 year old F with PMH of diastolic CHF, [id number], PAF on Eliquis, SSS/PPM, HTN, CAD, venous insufficiency, LLE ulcer, morbid obesity and ambulatory dysfunction presenting with shortness of breath, wheeze and productive cough, and admitted for community-acquired pneumonia, acute on chronic diastolic CHF and physical deconditioning.  Started on CTX, azithromycin and Lasix.   Subjective:  Seen and examined earlier this morning.  No major events overnight of this morning.  She says her breathing and cough are "a little" better.  She reports feeling dizzy/lightheaded.  She also reports constipation.  She says she had a small bowel movement yesterday.  She denies chest pain, nausea, vomiting or UTI symptoms.  Objective: Vitals:   07/17/21 1950 07/18/21 0520 07/18/21 0722 07/18/21 1313  BP:  135/61  123/68  Pulse:  62  66  Resp:  19  16  Temp:  98.2 F (36.8 C)  98.4 F (36.9 C)  TempSrc:  Oral  Oral  SpO2: 98% 92% 93% 92%  Weight:  (!) 166.9 kg    Height:        Intake/Output Summary (Last 24 hours) at 07/18/2021 1445 Last data filed at 07/18/2021 1300 Gross per 24 hour  Intake 1160 ml  Output 900 ml  Net 260 ml   Filed Weights   07/16/21 1900 07/17/21 0600 07/18/21 0520  Weight: (!) 167.3 kg (!) 168.5 kg (!) 166.9 kg    Examination:  GENERAL: No apparent distress.  Nontoxic. HEENT: MMM.  Vision and hearing grossly intact.  NECK: Supple.  Difficult to assess JVD due to body habitus. RESP: 92% on RA.  No IWOB.  Fair aeration bilaterally but limited exam due to body habitus. CVS:  RRR. Heart sounds normal.  ABD/GI/GU: BS+. Abd soft, NTND.  MSK/EXT:   Moves extremities. No apparent deformity.  1+ edema with chronic venous insufficiency SKIN: BLE skin changes in line with venous insufficiency.  Small LLE ulceration NEURO: Awake, alert and oriented appropriately.  No apparent focal neuro deficit. PSYCH: Calm. Normal affect.   Procedures:  None  Microbiology summarized: [id number] and influenza PCR nonreactive.  Assessment & Plan: Shortness of breath/productive cough/wheeze-hip presentation is not quite clear but I suspect more of CHF than infectious etiology.  She is also on amiodarone but small dose.  BNP 71 but not reliable given her body habitus.  Difficult to assess fluid status due to morbid obesity.  She has chronic venous insufficiency.  Limited TTE in 06/2020 with LVEF of 65 to 70%, G2 DD.  She is on Lasix 40 mg twice daily as needed at home.  -Start IV Lasix 40 mg twice daily -Limited echocardiogram -Monitor fluid status, renal functions and electrolytes. -Continue breathing treatments. -Repeat CXR in the morning.  Acute on chronic diastolic CHF -Management as above.  Possible community-acquired pneumonia-presents with SOB/wheeze/productive cough with clear phlegm but no leukocytosis or fever to suggest infectious process.  Lactic acid within normal.  CXR with bibasilar opacity but nonspecific especially with her body habitus. -Check procalcitonin -Continue CTX and azithromycin for now -Encourage incentive spirometry/OOB/PT/OT  CKD-3B: Stable. Recent Labs    07/22/20 0124 09/18/20 1346 09/19/20 1443 09/20/20 0124 09/21/20 0142 09/22/20 0300 09/23/20 0230  09/23/20 1348 09/24/20 0036 07/16/21 1138  BUN '14 15 14 13 13 12 14 14 19 14  '$ CREATININE 1.37* 1.59* 1.67* 1.64* 1.56* 1.50* 1.51* 1.46* 1.60* 1.48*  -Continue monitoring while on diuretics  Paroxysmal A. Fib: Now in sinus rhythm. -Continue home metoprolol, amiodarone and Eliquis.  Essential hypertension: Normotensive. -Continue metoprolol -Continue holding  amlodipine and Imdur.  History of CAD/chronic LBBB: No chest pain.  EKG without acute ischemic finding. -Continue metoprolol, statin and aspirin  Generalized weakness/physical deconditioning: Lives with great granddaughter.  Uses walker at baseline. -PT/OT eval  Chronic venous insufficiency with LLE ulceration: POA.  No signs of infection. -Consult wound care. Louretta Parma boot?  Morbid obesity-difficult situation. Body mass index is 57.63 kg/m.  -Encourage lifestyle change      DVT prophylaxis:   apixaban (ELIQUIS) tablet 5 mg  Code Status: Full code Family Communication: Patient and/or RN. Available if any question.  Level of care: Telemetry Medical Status is: Inpatient  Remains inpatient appropriate because:IV treatments appropriate due to intensity of illness or inability to take PO and Inpatient level of care appropriate due to severity of illness  Dispo: The patient is from: Home              Anticipated d/c is to: Home              Patient currently is not medically stable to d/c.   Difficult to place patient No       Consultants:  None   Sch Meds:  Scheduled Meds:  allopurinol  100 mg Oral Daily   amiodarone  200 mg Oral Daily   amLODipine  5 mg Oral Daily   apixaban  5 mg Oral BID   aspirin EC  81 mg Oral Daily   azithromycin  500 mg Oral Daily   budesonide (PULMICORT) nebulizer solution  0.5 mg Nebulization BID   docusate sodium  100 mg Oral BID   furosemide  40 mg Oral q AM   guaiFENesin  1,200 mg Oral BID   ipratropium-albuterol  3 mL Nebulization BID   metoprolol tartrate  12.5 mg Oral BID   oxybutynin  5 mg Oral BID   pantoprazole  40 mg Oral BID   predniSONE  40 mg Oral Q breakfast   rosuvastatin  10 mg Oral Daily   Continuous Infusions:  cefTRIAXone (ROCEPHIN)  IV 2 g (07/18/21 1420)   PRN Meds:.acetaminophen, albuterol, famotidine, fluticasone, oxyCODONE-acetaminophen, polyethylene glycol, senna-docusate  Antimicrobials: Anti-infectives  (From admission, onward)    Start     Dose/Rate Route Frequency Ordered Stop   07/17/21 1400  cefTRIAXone (ROCEPHIN) 2 g in sodium chloride 0.9 % 100 mL IVPB        2 g 200 mL/hr over 30 Minutes Intravenous Every 24 hours 07/16/21 1417 07/21/21 1359   07/17/21 1000  azithromycin (ZITHROMAX) tablet 500 mg        500 mg Oral Daily 07/16/21 1417 07/21/21 0959   07/16/21 1345  cefTRIAXone (ROCEPHIN) 2 g in sodium chloride 0.9 % 100 mL IVPB        2 g 200 mL/hr over 30 Minutes Intravenous  Once 07/16/21 1335 07/16/21 1426   07/16/21 1345  azithromycin (ZITHROMAX) 500 mg in sodium chloride 0.9 % 250 mL IVPB        500 mg 250 mL/hr over 60 Minutes Intravenous  Once 07/16/21 1335 07/16/21 1709        I have personally reviewed the following labs and images: CBC: Recent Labs  Lab 07/16/21 1138  WBC 4.0  NEUTROABS 1.6*  HGB 11.9*  HCT 38.7  MCV 97.7  PLT 196   BMP &GFR Recent Labs  Lab 07/16/21 1138  NA 140  K 4.0  CL 107  CO2 28  GLUCOSE 104*  BUN 14  CREATININE 1.48*  CALCIUM 8.6*   Estimated Creatinine Clearance: 52.1 mL/min (A) (by C-G formula based on SCr of 1.48 mg/dL (H)). Liver & Pancreas: Recent Labs  Lab 07/16/21 1138  AST 20  ALT 10  ALKPHOS 91  BILITOT 0.6  PROT 7.6  ALBUMIN 3.1*   No results for input(s): LIPASE, AMYLASE in the last 168 hours. No results for input(s): AMMONIA in the last 168 hours. Diabetic: No results for input(s): HGBA1C in the last 72 hours. Recent Labs  Lab 07/16/21 1118  GLUCAP 98   Cardiac Enzymes: No results for input(s): CKTOTAL, CKMB, CKMBINDEX, TROPONINI in the last 168 hours. No results for input(s): PROBNP in the last 8760 hours. Coagulation Profile: No results for input(s): INR, PROTIME in the last 168 hours. Thyroid Function Tests: No results for input(s): TSH, T4TOTAL, FREET4, T3FREE, THYROIDAB in the last 72 hours. Lipid Profile: No results for input(s): CHOL, HDL, LDLCALC, TRIG, CHOLHDL, LDLDIRECT in the  last 72 hours. Anemia Panel: No results for input(s): VITAMINB12, FOLATE, FERRITIN, TIBC, IRON, RETICCTPCT in the last 72 hours. Urine analysis:    Component Value Date/Time   COLORURINE STRAW (A) 07/16/2021 2320   APPEARANCEUR CLEAR 07/16/2021 2320   LABSPEC 1.006 07/16/2021 2320   PHURINE 7.0 07/16/2021 2320   GLUCOSEU NEGATIVE 07/16/2021 2320   HGBUR NEGATIVE 07/16/2021 2320   BILIRUBINUR NEGATIVE 07/16/2021 2320   KETONESUR NEGATIVE 07/16/2021 2320   PROTEINUR NEGATIVE 07/16/2021 2320   UROBILINOGEN 0.2 02/07/2013 0203   NITRITE NEGATIVE 07/16/2021 2320   LEUKOCYTESUR LARGE (A) 07/16/2021 2320   Sepsis Labs: Invalid input(s): PROCALCITONIN, [place]  Microbiology: Recent Results (from the past 240 hour(s))  Blood culture (routine x 2)     Status: None (Preliminary result)   Collection Time: 07/16/21 11:28 AM   Specimen: BLOOD RIGHT ARM  Result Value Ref Range Status   Specimen Description BLOOD RIGHT ARM  Final   Special Requests   Final    BOTTLES DRAWN AEROBIC AND ANAEROBIC Blood Culture adequate volume   Culture   Final    NO GROWTH 2 DAYS Performed at [facility], [address] [place], [place] [address]    Report Status PENDING  Incomplete  Blood culture (routine x 2)     Status: None (Preliminary result)   Collection Time: 07/16/21 11:42 AM   Specimen: BLOOD LEFT ARM  Result Value Ref Range Status   Specimen Description BLOOD LEFT ARM  Final   Special Requests   Final    BOTTLES DRAWN AEROBIC AND ANAEROBIC Blood Culture results may not be optimal due to an inadequate volume of blood received in culture bottles   Culture   Final    NO GROWTH 2 DAYS Performed at [facility], [place] [address], [place], [place] [address]    Report Status PENDING  Incomplete  Resp Panel by RT-PCR (Flu A&B, Covid) Nasopharyngeal Swab     Status: None   Collection Time: 07/16/21 11:46 AM   Specimen: Nasopharyngeal Swab; Nasopharyngeal(NP) swabs in vial transport  medium  Result Value Ref Range Status   SARS Coronavirus 2 by RT PCR NEGATIVE NEGATIVE Final    Comment: (NOTE) SARS-CoV-2 target nucleic acids are  NOT DETECTED.  The SARS-CoV-2 RNA is generally detectable in upper respiratory specimens during the acute phase of infection. The lowest concentration of SARS-CoV-2 viral copies this assay can detect is 138 copies/mL. A negative result does not preclude SARS-Cov-2 infection and should not be used as the sole basis for treatment or other patient management decisions. A negative result may occur with  improper specimen collection/handling, submission of specimen other than nasopharyngeal swab, presence of viral mutation(s) within the areas targeted by this assay, and inadequate number of viral copies(<138 copies/mL). A negative result must be combined with clinical observations, patient history, and epidemiological information. The expected result is Negative.  Fact Sheet for Patients:  EntrepreneurPulse.com.au  Fact Sheet for Healthcare Providers:  IncredibleEmployment.be  This test is no t yet approved or cleared by the [place] FDA and  has been authorized for detection and/or diagnosis of SARS-CoV-2 by FDA under an Emergency Use Authorization (EUA). This EUA will remain  in effect (meaning this test can be used) for the duration of the COVID-19 declaration under Section 564(b)(1) of the Act, 21 U.S.C.section 360bbb-3(b)(1), unless the authorization is terminated  or revoked sooner.       Influenza A by PCR NEGATIVE NEGATIVE Final   Influenza B by PCR NEGATIVE NEGATIVE Final    Comment: (NOTE) The Xpert Xpress SARS-CoV-2/FLU/RSV plus assay is intended as an aid in the diagnosis of influenza from Nasopharyngeal swab specimens and should not be used as a sole basis for treatment. Nasal washings and aspirates are unacceptable for Xpert Xpress SARS-CoV-2/FLU/RSV testing.  Fact Sheet for  Patients: EntrepreneurPulse.com.au  Fact Sheet for Healthcare Providers: IncredibleEmployment.be  This test is not yet approved or cleared by the [place] FDA and has been authorized for detection and/or diagnosis of SARS-CoV-2 by FDA under an Emergency Use Authorization (EUA). This EUA will remain in effect (meaning this test can be used) for the duration of the COVID-19 declaration under Section 564(b)(1) of the Act, 21 U.S.C. section 360bbb-3(b)(1), unless the authorization is terminated or revoked.  Performed at [facility], [place] [address] [place], [place] [address]     Radiology Studies: No results found.  55 minutes with more than 50% spent in reviewing records, counseling patient/family and coordinating care.   Blanch Stang T. [place]  If 7PM-7AM, please contact night-coverage [email or web address] 07/18/2021, 2:45 PM

## 2021-07-18 NOTE — Evaluation (Signed)
Occupational Therapy Evaluation Patient Details Name: [name] MRN: [id number] DOB: [birth year] Today's Date: 07/18/2021   History of Present Illness 80 y.o. female presented to ED 07/16/21 with new onset of cough, wheezing, shortness of breath.  In ED chest x-ray showed suspicious bilateral lower field infiltrates. IV antibiotics administered and pt admitted for treatment of CAP and acute on chronic diastolic CHP PMH: of chronic diastolic CHF, CKD stage IIb, PAF on Eliquis, sick sinus syndrome status post pacemaker, HTN, CAD.   Clinical Impression   Patient admitted for the diagnosis above.  PTA she lives with her granddaughter, and has 1.5 hours/day and 5x.wk with ADL and meal assistance while family is out of the home.  She is hoping to discharge home tomorrow with [facility] services.  OT will defer continued treatment to [facility] OT for establishment of an UB HEP.       Recommendations for follow up therapy are one component of a multi-disciplinary discharge planning process, led by the attending physician.  Recommendations may be updated based on patient status, additional functional criteria and insurance authorization.   Follow Up Recommendations  Home health OT    Equipment Recommendations  None recommended by OT    Recommendations for Other Services       Precautions / Restrictions Precautions Precautions: Fall Restrictions Weight Bearing Restrictions: No      Mobility Bed Mobility               General bed mobility comments: up in recliner    Transfers Overall transfer level: Needs assistance Equipment used: Rolling walker (2 wheeled) Transfers: Stand Pivot Transfers;Sit to/from Stand Sit to Stand: Min guard Stand pivot transfers: Min guard            Balance Overall balance assessment: Needs assistance Sitting-balance support: Feet supported;No upper extremity supported Sitting balance-Leahy Scale: Good     Standing balance support: Bilateral upper  extremity supported Standing balance-Leahy Scale: Poor                             ADL either performed or assessed with clinical judgement   ADL Overall ADL's : At baseline                                             Vision Patient Visual Report: No change from baseline                  Pertinent Vitals/Pain Pain Assessment: No/denies pain Pain Intervention(s): Monitored during session     Hand Dominance Right   Extremity/Trunk Assessment Upper Extremity Assessment Upper Extremity Assessment: Overall WFL for tasks assessed   Lower Extremity Assessment Lower Extremity Assessment: Defer to PT evaluation   Cervical / Trunk Assessment Cervical / Trunk Assessment: Other exceptions Cervical / Trunk Exceptions: body habitus   Communication Communication Communication: No difficulties   Cognition Arousal/Alertness: Awake/alert Behavior During Therapy: WFL for tasks assessed/performed Overall Cognitive Status: Within Functional Limits for tasks assessed                                                      Home Living Family/patient expects to be discharged to:: Private  residence Living Arrangements: Other relatives Available Help at Discharge: Family;Personal care attendant;Available PRN/intermittently Type of Home: House Home Access: Stairs to enter [employer] of Steps: 2 Entrance Stairs-Rails: None Home Layout: One level     Bathroom Shower/Tub: Walk-in Hydrologist: Standard     Home Equipment: Environmental consultant - 2 wheels;Wheelchair - [employer];Shower seat;Hand held shower head;Grab bars - tub/shower   Additional Comments: HHA 5x per week 1.5 hours at a time      Prior Functioning/Environment Level of Independence: Needs assistance  Gait / Transfers Assistance Needed: RW for household distances. W/c for longer distances ADL's / Homemaking Assistance Needed: has Aide  1.5 hrs/5day/wk for lower body bathing and dressing sink side.  Will shower 2x/wk with aide.  Greatgranddaughter does iADLs.            OT Problem List: Decreased activity tolerance;Impaired balance (sitting and/or standing);Decreased strength      OT Treatment/Interventions:      OT Goals(Current goals can be found in the care plan section) Acute Rehab OT Goals Patient Stated Goal: get back home OT Goal Formulation: With patient Time For Goal Achievement: 07/18/21 Potential to Achieve Goals: Good  OT Frequency:     Barriers to D/C:  None noted          Co-evaluation              AM-PAC OT "6 Clicks" Daily Activity     Outcome Measure Help from another person eating meals?: None Help from another person taking care of personal grooming?: None Help from another person toileting, which includes using toliet, bedpan, or urinal?: A Little Help from another person bathing (including washing, rinsing, drying)?: A Lot Help from another person to put on and taking off regular upper body clothing?: A Little Help from another person to put on and taking off regular lower body clothing?: A Lot 6 Click Score: 18   End of Session Equipment Utilized During Treatment: Rolling walker Nurse Communication: Other (comment) (needs pure wick replaced)  Activity Tolerance: Patient tolerated treatment well Patient left: in chair;with call bell/phone within reach  OT Visit Diagnosis: Unsteadiness on feet (R26.81);Muscle weakness (generalized) (M62.81)                Time: [id number] OT Time Calculation (min): 17 min Charges:  OT General Charges $OT Visit: 1 Visit OT Evaluation $OT Eval Moderate Complexity: 1 Mod  07/18/2021  RP, OTR/L  Acute Rehabilitation Services  Office:  [phone]   Metta Clines 07/18/2021, 2:20 PM

## 2021-07-18 NOTE — Progress Notes (Signed)
Pt. Requesting med for cough. On call for [facility] paged to make aware.

## 2021-07-19 ENCOUNTER — Inpatient Hospital Stay (HOSPITAL_COMMUNITY): Payer: Medicare Other

## 2021-07-19 DIAGNOSIS — R0602 Shortness of breath: Secondary | ICD-10-CM

## 2021-07-19 DIAGNOSIS — R042 Hemoptysis: Secondary | ICD-10-CM

## 2021-07-19 DIAGNOSIS — J189 Pneumonia, unspecified organism: Secondary | ICD-10-CM | POA: Diagnosis not present

## 2021-07-19 DIAGNOSIS — N1831 Chronic kidney disease, stage 3a: Secondary | ICD-10-CM | POA: Diagnosis not present

## 2021-07-19 DIAGNOSIS — I872 Venous insufficiency (chronic) (peripheral): Secondary | ICD-10-CM | POA: Diagnosis not present

## 2021-07-19 DIAGNOSIS — I5033 Acute on chronic diastolic (congestive) heart failure: Secondary | ICD-10-CM | POA: Diagnosis not present

## 2021-07-19 LAB — IRON AND TIBC
Iron: 23 ug/dL — ABNORMAL LOW (ref 28–170)
Saturation Ratios: 6 % — ABNORMAL LOW (ref 10.4–31.8)
TIBC: 361 ug/dL (ref 250–450)
UIBC: 338 ug/dL

## 2021-07-19 LAB — CBC
HCT: 36.6 % (ref 36.0–46.0)
Hemoglobin: 12 g/dL (ref 12.0–15.0)
MCH: 30.8 pg (ref 26.0–34.0)
MCHC: 32.8 g/dL (ref 30.0–36.0)
MCV: 93.8 fL (ref 80.0–100.0)
Platelets: 191 10*3/uL (ref 150–400)
RBC: 3.9 MIL/uL (ref 3.87–5.11)
RDW: 17.2 % — ABNORMAL HIGH (ref 11.5–15.5)
WBC: 6.4 10*3/uL (ref 4.0–10.5)
nRBC: 0 % (ref 0.0–0.2)

## 2021-07-19 LAB — RENAL FUNCTION PANEL
Albumin: 2.8 g/dL — ABNORMAL LOW (ref 3.5–5.0)
Anion gap: 8 (ref 5–15)
BUN: 18 mg/dL (ref 8–23)
CO2: 27 mmol/L (ref 22–32)
Calcium: 8.7 mg/dL — ABNORMAL LOW (ref 8.9–10.3)
Chloride: 106 mmol/L (ref 98–111)
Creatinine, Ser: 1.33 mg/dL — ABNORMAL HIGH (ref 0.44–1.00)
GFR, Estimated: 41 mL/min — ABNORMAL LOW (ref >60)
Glucose, Bld: 106 mg/dL — ABNORMAL HIGH (ref 70–99)
Phosphorus: 3.2 mg/dL (ref 2.5–4.6)
Potassium: 3.5 mmol/L (ref 3.5–5.1)
Sodium: 141 mmol/L (ref 135–145)

## 2021-07-19 LAB — FERRITIN: Ferritin: 24 ng/mL (ref 11–307)

## 2021-07-19 LAB — ECHOCARDIOGRAM LIMITED
Area-P 1/2: 3.03 cm2
Calc EF: 72.3 %
Height: 67 in
S' Lateral: 3.3 cm
Single Plane A2C EF: 73.2 %
Single Plane A4C EF: 72.8 %
Weight: 5897.75 oz

## 2021-07-19 LAB — PROCALCITONIN: Procalcitonin: <0.1 ng/mL

## 2021-07-19 LAB — VITAMIN B12: Vitamin B-12: 723 pg/mL (ref 180–914)

## 2021-07-19 LAB — MAGNESIUM: Magnesium: 2.2 mg/dL (ref 1.7–2.4)

## 2021-07-19 LAB — RETICULOCYTES
Immature Retic Fract: 23.9 % — ABNORMAL HIGH (ref 2.3–15.9)
RBC.: 3.84 MIL/uL — ABNORMAL LOW (ref 3.87–5.11)
Retic Count, Absolute: 70.3 10*3/uL (ref 19.0–186.0)
Retic Ct Pct: 1.8 % (ref 0.4–3.1)

## 2021-07-19 LAB — FOLATE: Folate: 14 ng/mL (ref >5.9)

## 2021-07-19 MED ORDER — POTASSIUM CHLORIDE CRYS ER 20 MEQ PO TBCR
40.0000 meq | EXTENDED_RELEASE_TABLET | Freq: Once | ORAL | Status: AC
  Administered 2021-07-19: 40 meq via ORAL
  Filled 2021-07-19: qty 2

## 2021-07-19 MED ORDER — GUAIFENESIN ER 600 MG PO TB12
600.0000 mg | ORAL_TABLET | Freq: Two times a day (BID) | ORAL | Status: DC
  Administered 2021-07-19 – 2021-07-23 (×8): 600 mg via ORAL
  Filled 2021-07-19 (×8): qty 1

## 2021-07-19 MED ORDER — SODIUM CHLORIDE 0.9 % IV SOLN
250.0000 mg | Freq: Once | INTRAVENOUS | Status: AC
  Administered 2021-07-19: 250 mg via INTRAVENOUS
  Filled 2021-07-19: qty 20

## 2021-07-19 MED ORDER — GUAIFENESIN-DM 100-10 MG/5ML PO SYRP
5.0000 mL | ORAL_SOLUTION | ORAL | Status: DC | PRN
  Administered 2021-07-21 (×2): 5 mL via ORAL
  Filled 2021-07-19 (×2): qty 5

## 2021-07-19 NOTE — Progress Notes (Signed)
PROGRESS NOTE  [name] [id number] DOB: 09-03-1941   PCP: Rocky Morel, MD  Patient is from: Home.  Lives with his great granddaughter.  Uses walker at baseline.  DOA: 07/16/2021 LOS: 3  Chief complaints:  Chief Complaint  Patient presents with   Dizziness     Brief Narrative / Interim history: 80 year old F with PMH of diastolic CHF, [id number], PAF on Eliquis, SSS/PPM, HTN, CAD, venous insufficiency, LLE ulcer, morbid obesity and ambulatory dysfunction presenting with shortness of breath, wheeze and productive cough, and admitted for community-acquired pneumonia, acute on chronic diastolic CHF and physical deconditioning.  Started on CTX, azithromycin and Lasix.   Patient's symptoms improved after escalating her Lasix to IV.   Subjective: Seen and examined earlier this morning.  No major events overnight of this morning.  Reports improvement in her breathing but still coughing.  Noted some blood-tinged sputum.  Also reports chest pain with cough.  Denies GI or UTI symptoms.  About 900 cc UOP overnight.  Creatinine improving.  Objective: Vitals:   07/18/21 2027 07/19/21 0437 07/19/21 0741 07/19/21 0832  BP: 114/66 (!) 145/63  (!) 131/57  Pulse: 65 (!) 59 64 (!) 59  Resp: '18 18 16 18  '$ Temp: 98.6 F (37 C) 98 F (36.7 C)  98.5 F (36.9 C)  TempSrc: Oral Oral  Oral  SpO2: 99% 95% 95% 95%  Weight:  (!) 167.2 kg    Height:        Intake/Output Summary (Last 24 hours) at 07/19/2021 1343 Last data filed at 07/19/2021 1112 Gross per 24 hour  Intake 240 ml  Output 2150 ml  Net -1910 ml   Filed Weights   07/17/21 0600 07/18/21 0520 07/19/21 0437  Weight: (!) 168.5 kg (!) 166.9 kg (!) 167.2 kg    Examination:  GENERAL: No apparent distress.  Nontoxic. HEENT: MMM.  Vision and hearing grossly intact.  NECK: Supple.  Difficult to assess JVD due to body habitus. RESP: 95% on RA.  No IWOB.  Fair aeration but limited exam due to body habitus. CVS:   RRR. Heart sounds normal.  ABD/GI/GU: BS+. Abd soft, NTND.  MSK/EXT:  Moves extremities.  +1 edema with chronic venous insufficiency SKIN: BLE skin change in line with venous insufficiency.  Small LLE ulceration. NEURO: Awake and alert. Oriented appropriately.  No apparent focal neuro deficit. PSYCH: Calm. Normal affect.   Procedures:  None  Microbiology summarized: [id number] and influenza PCR nonreactive.  Assessment & Plan: Shortness of breath/productive cough/wheeze-hip presentation is not quite clear but I suspect more of CHF than infectious etiology.  She is also on amiodarone but small dose.  BNP 71 but not reliable given her body habitus.  Difficult to assess fluid status due to morbid obesity.  She has chronic venous insufficiency.  Limited TTE in 06/2020 with LVEF of 65 to 70%, G2 DD.  She is on Lasix 40 mg twice daily as needed at home.  Breathing and renal function improved with IV Lasix.  Improved aeration on CXR. -Continue IV Lasix 40 mg twice daily -Follow Limited echocardiogram -Monitor fluid status, renal functions and electrolytes. -Continue breathing treatments.  Acute on chronic diastolic CHF -Management as above.  Possible community-acquired pneumonia-presents with SOB/wheeze/productive cough with clear phlegm but no leukocytosis or fever to suggest infectious process.  Lactic acid within normal.  CXR as above.  Pro-Cal negative.  She now has cough with blood-tinged phlegm.  She is on Eliquis. -Encourage incentive spirometry/OOB/PT/OT -Antitussive and mucolytic's. -Complete 4  to 5 days of antibiotic -Discontinue steroid  CKD-3B: Improved with diuretics. Recent Labs    09/18/20 1346 09/19/20 1443 09/20/20 0124 09/21/20 0142 09/22/20 0300 09/23/20 0230 09/23/20 1348 09/24/20 0036 07/16/21 1138 07/19/21 0259  BUN '15 14 13 13 12 14 14 19 14 18  '$ CREATININE 1.59* 1.67* 1.64* 1.56* 1.50* 1.51* 1.46* 1.60* 1.48* 1.33*  -Continue monitoring while on  diuretics  Paroxysmal A. Fib: Now in sinus rhythm. -Continue home metoprolol, amiodarone and Eliquis. -May hold Eliquis if hemoptysis is worse.  Hemoptysis: Small blood-tinged phlegm likely from coughing.  She is on Eliquis and aspirin -Discontinued aspirin -May hold Eliquis if worse.  Essential hypertension: Normotensive. -Continue metoprolol -Continue holding amlodipine and Imdur.  History of CAD/chronic LBBB: No chest pain.  EKG without acute ischemic finding. -Continue metoprolol and a statin.  Stopped aspirin due to risk of bleeding with Eliquis  Generalized weakness/physical deconditioning: Lives with great granddaughter.  Uses walker at baseline. -PT/OT eval  Chronic venous insufficiency with LLE ulceration: POA.  No signs of infection. -Consult wound care. Louretta Parma boot?  Morbid obesity-difficult situation. Body mass index is 57.73 kg/m.  -Encourage lifestyle change      DVT prophylaxis:  apixaban (ELIQUIS) tablet 5 mg   Code Status: Full code Family Communication: Patient and/or RN. Available if any question.  Level of care: Telemetry Medical Status is: Inpatient  Remains inpatient appropriate because:IV treatments appropriate due to intensity of illness or inability to take PO and Inpatient level of care appropriate due to severity of illness  Dispo: The patient is from: Home              Anticipated d/c is to: Home              Patient currently is not medically stable to d/c.   Difficult to place patient No       Consultants:  None   Sch Meds:  Scheduled Meds:  allopurinol  100 mg Oral Daily   amiodarone  200 mg Oral Daily   apixaban  5 mg Oral BID   azithromycin  500 mg Oral Daily   budesonide (PULMICORT) nebulizer solution  0.5 mg Nebulization BID   docusate sodium  100 mg Oral BID   furosemide  40 mg Intravenous BID   guaiFENesin  600 mg Oral BID   ipratropium-albuterol  3 mL Nebulization BID   metoprolol tartrate  12.5 mg Oral BID    oxybutynin  5 mg Oral BID   pantoprazole  40 mg Oral BID   rosuvastatin  10 mg Oral Daily   Continuous Infusions:  cefTRIAXone (ROCEPHIN)  IV 2 g (07/18/21 1420)   ferric gluconate (FERRLECIT) IVPB     PRN Meds:.acetaminophen, albuterol, famotidine, fluticasone, guaiFENesin-dextromethorphan, oxyCODONE-acetaminophen, polyethylene glycol, senna-docusate  Antimicrobials: Anti-infectives (From admission, onward)    Start     Dose/Rate Route Frequency Ordered Stop   07/17/21 1400  cefTRIAXone (ROCEPHIN) 2 g in sodium chloride 0.9 % 100 mL IVPB        2 g 200 mL/hr over 30 Minutes Intravenous Every 24 hours 07/16/21 1417 07/21/21 1359   07/17/21 1000  azithromycin (ZITHROMAX) tablet 500 mg        500 mg Oral Daily 07/16/21 1417 07/21/21 0959   07/16/21 1345  cefTRIAXone (ROCEPHIN) 2 g in sodium chloride 0.9 % 100 mL IVPB        2 g 200 mL/hr over 30 Minutes Intravenous  Once 07/16/21 1335 07/16/21 1426   07/16/21 1345  azithromycin (ZITHROMAX) 500 mg in sodium chloride 0.9 % 250 mL IVPB        500 mg 250 mL/hr over 60 Minutes Intravenous  Once 07/16/21 1335 07/16/21 1709        I have personally reviewed the following labs and images: CBC: Recent Labs  Lab 07/16/21 1138 07/19/21 0259  WBC 4.0 6.4  NEUTROABS 1.6*  --   HGB 11.9* 12.0  HCT 38.7 36.6  MCV 97.7 93.8  PLT 196 191   BMP &GFR Recent Labs  Lab 07/16/21 1138 07/19/21 0259  NA 140 141  K 4.0 3.5  CL 107 106  CO2 28 27  GLUCOSE 104* 106*  BUN 14 18  CREATININE 1.48* 1.33*  CALCIUM 8.6* 8.7*  MG  --  2.2  PHOS  --  3.2   Estimated Creatinine Clearance: 58 mL/min (A) (by C-G formula based on SCr of 1.33 mg/dL (H)). Liver & Pancreas: Recent Labs  Lab 07/16/21 1138 07/19/21 0259  AST 20  --   ALT 10  --   ALKPHOS 91  --   BILITOT 0.6  --   PROT 7.6  --   ALBUMIN 3.1* 2.8*   No results for input(s): LIPASE, AMYLASE in the last 168 hours. No results for input(s): AMMONIA in the last 168  hours. Diabetic: No results for input(s): HGBA1C in the last 72 hours. Recent Labs  Lab 07/16/21 1118  GLUCAP 98   Cardiac Enzymes: No results for input(s): CKTOTAL, CKMB, CKMBINDEX, TROPONINI in the last 168 hours. No results for input(s): PROBNP in the last 8760 hours. Coagulation Profile: No results for input(s): INR, PROTIME in the last 168 hours. Thyroid Function Tests: No results for input(s): TSH, T4TOTAL, FREET4, T3FREE, THYROIDAB in the last 72 hours. Lipid Profile: No results for input(s): CHOL, HDL, LDLCALC, TRIG, CHOLHDL, LDLDIRECT in the last 72 hours. Anemia Panel: Recent Labs    07/19/21 0259  VITAMINB12 723  FOLATE 14.0  FERRITIN 24  TIBC 361  IRON 23*  RETICCTPCT 1.8   Urine analysis:    Component Value Date/Time   COLORURINE STRAW (A) 07/16/2021 2320   APPEARANCEUR CLEAR 07/16/2021 2320   LABSPEC 1.006 07/16/2021 2320   PHURINE 7.0 07/16/2021 2320   GLUCOSEU NEGATIVE 07/16/2021 2320   HGBUR NEGATIVE 07/16/2021 2320   BILIRUBINUR NEGATIVE 07/16/2021 2320   KETONESUR NEGATIVE 07/16/2021 2320   PROTEINUR NEGATIVE 07/16/2021 2320   UROBILINOGEN 0.2 02/07/2013 0203   NITRITE NEGATIVE 07/16/2021 2320   LEUKOCYTESUR LARGE (A) 07/16/2021 2320   Sepsis Labs: Invalid input(s): PROCALCITONIN, [place]  Microbiology: Recent Results (from the past 240 hour(s))  Blood culture (routine x 2)     Status: None (Preliminary result)   Collection Time: 07/16/21 11:28 AM   Specimen: BLOOD RIGHT ARM  Result Value Ref Range Status   Specimen Description BLOOD RIGHT ARM  Final   Special Requests   Final    BOTTLES DRAWN AEROBIC AND ANAEROBIC Blood Culture adequate volume   Culture   Final    NO GROWTH 3 DAYS Performed at [facility], [address] [place], [place] [address]    Report Status PENDING  Incomplete  Blood culture (routine x 2)     Status: None (Preliminary result)   Collection Time: 07/16/21 11:42 AM   Specimen: BLOOD LEFT ARM  Result  Value Ref Range Status   Specimen Description BLOOD LEFT ARM  Final   Special Requests   Final    BOTTLES DRAWN AEROBIC  AND ANAEROBIC Blood Culture results may not be optimal due to an inadequate volume of blood received in culture bottles   Culture   Final    NO GROWTH 3 DAYS Performed at [facility], [place] [address] [place], [place] [address]    Report Status PENDING  Incomplete  Resp Panel by RT-PCR (Flu A&B, Covid) Nasopharyngeal Swab     Status: None   Collection Time: 07/16/21 11:46 AM   Specimen: Nasopharyngeal Swab; Nasopharyngeal(NP) swabs in vial transport medium  Result Value Ref Range Status   SARS Coronavirus 2 by RT PCR NEGATIVE NEGATIVE Final    Comment: (NOTE) SARS-CoV-2 target nucleic acids are NOT DETECTED.  The SARS-CoV-2 RNA is generally detectable in upper respiratory specimens during the acute phase of infection. The lowest concentration of SARS-CoV-2 viral copies this assay can detect is 138 copies/mL. A negative result does not preclude SARS-Cov-2 infection and should not be used as the sole basis for treatment or other patient management decisions. A negative result may occur with  improper specimen collection/handling, submission of specimen other than nasopharyngeal swab, presence of viral mutation(s) within the areas targeted by this assay, and inadequate number of viral copies(<138 copies/mL). A negative result must be combined with clinical observations, patient history, and epidemiological information. The expected result is Negative.  Fact Sheet for Patients:  EntrepreneurPulse.com.au  Fact Sheet for Healthcare Providers:  IncredibleEmployment.be  This test is no t yet approved or cleared by the [place] FDA and  has been authorized for detection and/or diagnosis of SARS-CoV-2 by FDA under an Emergency Use Authorization (EUA). This EUA will remain  in effect (meaning this test can be used) for the  duration of the COVID-19 declaration under Section 564(b)(1) of the Act, 21 U.S.C.section 360bbb-3(b)(1), unless the authorization is terminated  or revoked sooner.       Influenza A by PCR NEGATIVE NEGATIVE Final   Influenza B by PCR NEGATIVE NEGATIVE Final    Comment: (NOTE) The Xpert Xpress SARS-CoV-2/FLU/RSV plus assay is intended as an aid in the diagnosis of influenza from Nasopharyngeal swab specimens and should not be used as a sole basis for treatment. Nasal washings and aspirates are unacceptable for Xpert Xpress SARS-CoV-2/FLU/RSV testing.  Fact Sheet for Patients: EntrepreneurPulse.com.au  Fact Sheet for Healthcare Providers: IncredibleEmployment.be  This test is not yet approved or cleared by the [place] FDA and has been authorized for detection and/or diagnosis of SARS-CoV-2 by FDA under an Emergency Use Authorization (EUA). This EUA will remain in effect (meaning this test can be used) for the duration of the COVID-19 declaration under Section 564(b)(1) of the Act, 21 U.S.C. section 360bbb-3(b)(1), unless the authorization is terminated or revoked.  Performed at [facility], [place] [address] [place], [place] [address]     Radiology Studies: DG Chest Port 1 View  Result Date: 07/19/2021 CLINICAL DATA:  Dyspnea.  History of CHF. EXAM: PORTABLE CHEST 1 VIEW COMPARISON:  07/16/2021 and older exams. FINDINGS: Stable mild enlargement of the cardiopericardial silhouette. Stable right anterior chest wall sequential pacemaker, leads projecting over the right atrium and right ventricle. No mediastinal or hilar masses. No evidence of a pleural effusion or pneumothorax. Skeletal structures are grossly intact. IMPRESSION: No acute cardiopulmonary disease. Electronically Signed   By: Lajean Manes M.D.   On: 07/19/2021 08:47     Leanny Moeckel T. Leroy  If 7PM-7AM, please contact  night-coverage [email or web address] 07/19/2021, 1:43 PM

## 2021-07-19 NOTE — Progress Notes (Signed)
  Echocardiogram 2D Echocardiogram has been performed.  [name] 07/19/2021, 3:58 PM

## 2021-07-20 DIAGNOSIS — N1831 Chronic kidney disease, stage 3a: Secondary | ICD-10-CM | POA: Diagnosis not present

## 2021-07-20 DIAGNOSIS — J189 Pneumonia, unspecified organism: Secondary | ICD-10-CM | POA: Diagnosis not present

## 2021-07-20 DIAGNOSIS — I5033 Acute on chronic diastolic (congestive) heart failure: Secondary | ICD-10-CM | POA: Diagnosis not present

## 2021-07-20 DIAGNOSIS — I872 Venous insufficiency (chronic) (peripheral): Secondary | ICD-10-CM | POA: Diagnosis not present

## 2021-07-20 LAB — MAGNESIUM: Magnesium: 2.4 mg/dL (ref 1.7–2.4)

## 2021-07-20 LAB — LEGIONELLA PNEUMOPHILA SEROGP 1 UR AG: L. pneumophila Serogp 1 Ur Ag: NEGATIVE

## 2021-07-20 LAB — CBC
HCT: 38.1 % (ref 36.0–46.0)
Hemoglobin: 12.4 g/dL (ref 12.0–15.0)
MCH: 30.7 pg (ref 26.0–34.0)
MCHC: 32.5 g/dL (ref 30.0–36.0)
MCV: 94.3 fL (ref 80.0–100.0)
Platelets: 203 10*3/uL (ref 150–400)
RBC: 4.04 MIL/uL (ref 3.87–5.11)
RDW: 17.3 % — ABNORMAL HIGH (ref 11.5–15.5)
WBC: 6.3 10*3/uL (ref 4.0–10.5)
nRBC: 0 % (ref 0.0–0.2)

## 2021-07-20 LAB — RENAL FUNCTION PANEL
Albumin: 2.9 g/dL — ABNORMAL LOW (ref 3.5–5.0)
Anion gap: 8 (ref 5–15)
BUN: 23 mg/dL (ref 8–23)
CO2: 28 mmol/L (ref 22–32)
Calcium: 8.6 mg/dL — ABNORMAL LOW (ref 8.9–10.3)
Chloride: 104 mmol/L (ref 98–111)
Creatinine, Ser: 1.39 mg/dL — ABNORMAL HIGH (ref 0.44–1.00)
GFR, Estimated: 39 mL/min — ABNORMAL LOW (ref >60)
Glucose, Bld: 94 mg/dL (ref 70–99)
Phosphorus: 3.4 mg/dL (ref 2.5–4.6)
Potassium: 3.5 mmol/L (ref 3.5–5.1)
Sodium: 140 mmol/L (ref 135–145)

## 2021-07-20 LAB — PROCALCITONIN: Procalcitonin: <0.1 ng/mL

## 2021-07-20 MED ORDER — IPRATROPIUM-ALBUTEROL 0.5-2.5 (3) MG/3ML IN SOLN: 3.0000 mL | Freq: Four times a day (QID) | RESPIRATORY_TRACT | Status: DC | PRN

## 2021-07-20 MED ORDER — SENNOSIDES-DOCUSATE SODIUM 8.6-50 MG PO TABS
1.0000 | ORAL_TABLET | Freq: Two times a day (BID) | ORAL | Status: DC
  Administered 2021-07-20 – 2021-07-23 (×6): 1 via ORAL
  Filled 2021-07-20 (×7): qty 1

## 2021-07-20 NOTE — Progress Notes (Signed)
Physical Therapy Treatment Patient Details Name: [name] MRN: [id number] DOB: Nov 13, 1940 Today's Date: 07/20/2021   History of Present Illness 80 y.o. female presented to ED 07/16/21 with new onset of cough, wheezing, shortness of breath.  In ED chest x-ray showed suspicious bilateral lower field infiltrates. IV antibiotics administered and pt admitted for treatment of CAP and acute on chronic diastolic CHP PMH: of chronic diastolic CHF, CKD stage IIb, PAF on Eliquis, sick sinus syndrome status post pacemaker, HTN, CAD.    PT Comments    Pt agreeable to initiating gait training today. Pt ambulatory for 2x30 ft, requiring seated rest break to recover "wooziness" and fatigue. Pt with shortness of breath at end of gait distance, SpO2 99% with HR 65 bpm. PT to continue to follow.     Recommendations for follow up therapy are one component of a multi-disciplinary discharge planning process, led by the attending physician.  Recommendations may be updated based on patient status, additional functional criteria and insurance authorization.  Follow Up Recommendations  Home health PT;Supervision/Assistance - 24 hour     Equipment Recommendations  None recommended by PT    Recommendations for Other Services       Precautions / Restrictions Precautions Precautions: Fall Restrictions Weight Bearing Restrictions: No     Mobility  Bed Mobility Overal bed mobility: Needs Assistance             General bed mobility comments: up in recliner    Transfers Overall transfer level: Needs assistance Equipment used: Rolling walker (2 wheeled) Transfers: Sit to/from Stand Sit to Stand: Min assist         General transfer comment: min assist to rise, steady. STS x2, from recliner and chair in hallway.  Ambulation/Gait Ambulation/Gait assistance: Min assist Gait Distance (Feet): 30 Feet (x2) Assistive device: Rolling walker (2 wheeled) Gait Pattern/deviations: Step-through  pattern;Shuffle;Trunk flexed;Decreased stride length Gait velocity: dcr   General Gait Details: light steadying assist, and RW management during changing directions. seated rest break at 30 ft due to pt feeling "woozy", recovers with x2 minutes seated rest.   Stairs             Wheelchair Mobility    Modified Rankin (Stroke Patients Only)       Balance Overall balance assessment: Needs assistance Sitting-balance support: Feet supported;No upper extremity supported Sitting balance-Leahy Scale: Good     Standing balance support: Bilateral upper extremity supported Standing balance-Leahy Scale: Poor                              Cognition Arousal/Alertness: Awake/alert Behavior During Therapy: WFL for tasks assessed/performed Overall Cognitive Status: Within Functional Limits for tasks assessed                                        Exercises      General Comments        Pertinent Vitals/Pain Pain Assessment: Faces Faces Pain Scale: Hurts a little bit Pain Location: generalized Pain Descriptors / Indicators: Discomfort;Sore Pain Intervention(s): Monitored during session;Limited activity within patient's tolerance;Repositioned    Home Living                      Prior Function            PT Goals (current goals can now be found in  the care plan section) Acute Rehab PT Goals Patient Stated Goal: get back home to walk to back porch to sit outside PT Goal Formulation: With patient Time For Goal Achievement: 07/31/21 Potential to Achieve Goals: Fair Progress towards PT goals: Progressing toward goals    Frequency           PT Plan Current plan remains appropriate    Co-evaluation              AM-PAC PT "6 Clicks" Mobility   Outcome Measure  Help needed turning from your back to your side while in a flat bed without using bedrails?: None Help needed moving from lying on your back to sitting on the side  of a flat bed without using bedrails?: A Little Help needed moving to and from a bed to a chair (including a wheelchair)?: A Little Help needed standing up from a chair using your arms (e.g., wheelchair or bedside chair)?: A Little Help needed to walk in hospital room?: A Little Help needed climbing 3-5 steps with a railing? : A Lot 6 Click Score: 18    End of Session   Activity Tolerance: Patient limited by fatigue Patient left: in chair;with call bell/phone within reach;Other (comment) (pt verbalizes she will press call button and wait for assist prior to mobilizing out of chair) Nurse Communication: Mobility status PT Visit Diagnosis: Unsteadiness on feet (R26.81);Muscle weakness (generalized) (M62.81);Difficulty in walking, not elsewhere classified (R26.2)     Time: [id number] PT Time Calculation (min) (ACUTE ONLY): 18 min  Charges:  $Gait Training: 8-22 mins                    Stacie Glaze, PT DPT Acute Rehabilitation Services Pager [phone]  Office [phone]    Roxine Caddy E Ruffin Pyo 07/20/2021, 1:59 PM

## 2021-07-20 NOTE — Consult Note (Addendum)
[place] Nurse Consult Note: Patient receiving care in [facility] 3E06 Reason for Consult: LLE wound  Wound type: LLE open ulcers very small on the medial side of the leg. It was noted by Dr. Cyndia Skeeters "BLE skin change in line with venous insufficiency." However I can not find any recent vascular studies or any ABIs that have been completed. This patient is morbidly obese and has fibrotic thickening of the skin with edema present. I would recommend a consult to Vascular. For now I have placed a Xeroform gauze on the LLE small ulcerative areas and wrapped the leg with Kerlix. I would also recommend Heel offloading boots Kellie Simmering # [phone]) CONSULT TO VASCULAR APPRECIATED.  Monitor the wound area(s) for worsening of condition such as: Signs/symptoms of infection, increase in size, development of or worsening of odor, development of pain, or increased pain at the affected locations.   Notify the medical team if any of these develop.  Pressure Injury Prevention Bundle May use any that apply to this patient. Support surfaces (air mattress) chair cushion Kellie Simmering # [phone]) Heel offloading boots Kellie Simmering # [phone]) Turning and Positioning  Measures to reduce shear (draw sheet, knees up) Skin protection Products (Foam dressing) Moisture management products (Critic-Aid Barrier Cream (Purple top) Sween moisturizing lotion (Pink top in clean supply) Nutrition Management Protection for Medical Devices Routine Skin Assessment   Thank you for the consult. [place] nurse will not follow at this time.   Please re-consult the [place] team if needed.  Cathlean Marseilles Tamala Julian, MSN, RN, [place], Lysle Pearl, [facility] Wound Treatment Associate Pager [phone]

## 2021-07-20 NOTE — Progress Notes (Signed)
PROGRESS NOTE  [name] [id number] DOB: Oct 28, 1940   PCP: Rocky Morel, MD  Patient is from: Home.  Lives with his great granddaughter.  Uses walker at baseline.  DOA: 07/16/2021 LOS: 4  Chief complaints:  Chief Complaint  Patient presents with   Dizziness     Brief Narrative / Interim history: 80 year old F with PMH of diastolic CHF, [id number], PAF on Eliquis, SSS/PPM, HTN, CAD, venous insufficiency, LLE ulcer, morbid obesity and ambulatory dysfunction presenting with shortness of breath, wheeze and productive cough, and admitted for community-acquired pneumonia, acute on chronic diastolic CHF and physical deconditioning.  Started on CTX, azithromycin and Lasix.   Patient's symptoms improved after escalating her Lasix to IV.  Still with signs of significant fluid overload  Subjective: Seen and examined earlier this morning.  No major events overnight or this morning.  Reports improvement in her breathing but still with cough.  Cough remains productive with yellowish phlegm.  No further hemoptysis.  Reports chest pain with cough.  Does not think she is having bowel movements.  Denies nausea, vomiting or abdominal pain.  Denies UTI symptoms.  Objective: Vitals:   07/19/21 1605 07/19/21 1917 07/19/21 2006 07/20/21 0526  BP: 136/63  129/62 (!) 154/72  Pulse: 63 60 (!) 59 (!) 59  Resp: '18 18 19 18  '$ Temp: 98.7 F (37.1 C)  98.4 F (36.9 C) 97.8 F (36.6 C)  TempSrc: Oral  Oral Oral  SpO2: 93% 94% 90% 92%  Weight:    (!) 159.6 kg  Height:        Intake/Output Summary (Last 24 hours) at 07/20/2021 1202 Last data filed at 07/20/2021 0900 Gross per 24 hour  Intake 1210 ml  Output 2350 ml  Net -1140 ml   Filed Weights   07/18/21 0520 07/19/21 0437 07/20/21 0526  Weight: (!) 166.9 kg (!) 167.2 kg (!) 159.6 kg    Examination:  GENERAL: No apparent distress.  Nontoxic. HEENT: MMM.  Vision and hearing grossly intact.  NECK: Supple.  Difficult to  assess JVD due to body habitus. RESP: 92% on RA.  No IWOB.  Fair aeration but limited exam due to body habitus. CVS:  RRR. Heart sounds normal.  ABD/GI/GU: BS+. Abd soft, NTND.  MSK/EXT:  Moves extremities.  1-2 pitting edema in BLE partly from chronic venous insufficiency SKIN: BLE skin changes With venous insufficiency.  Small LLE ulceration. NEURO: Awake and alert. Oriented appropriately.  No apparent focal neuro deficit. PSYCH: Calm. Normal affect.   Procedures:  None  Microbiology summarized: [id number] and influenza PCR nonreactive.  Assessment & Plan: Shortness of breath/productive cough/wheeze-her presentation is not quite clear but I suspect more of CHF than infectious etiology.  She is also on amiodarone but small dose.  BNP 71 but not reliable given her body habitus.  Difficult to assess fluid status due to morbid obesity and venous insufficiency.  Limited TTE with LVEF of 55 to 60%, severe LVH, indeterminate DD, moderate LAE normal RV SF. She is on Lasix 40 mg twice daily as needed at home.  Breathing and renal function improved with IV Lasix.  About 3.6 L UOP/24 hours.  Net -4 L.  Improved aeration on CXR. -Continue IV Lasix 40 mg twice daily -Follow Limited echocardiogram -Monitor fluid status, renal functions and electrolytes. -Continue breathing treatments.  Acute on chronic diastolic CHF -Management as above.  Possible community-acquired pneumonia-presents with SOB/wheeze/productive cough with clear phlegm but no leukocytosis or fever to suggest infectious process.  Lactic acid  within normal.  CXR as above.  Pro-Cal negative. -Encourage incentive spirometry/OOB/PT/OT/antitussive and mucolytic's -Completed CAP coverage with CTX and azithromycin  CKD-3B: Stable. Recent Labs    09/19/20 1443 09/20/20 0124 09/21/20 0142 09/22/20 0300 09/23/20 0230 09/23/20 1348 09/24/20 0036 07/16/21 1138 07/19/21 0259 07/20/21 0241  BUN '14 13 13 12 14 14 19 14 18 23  '$ CREATININE  1.67* 1.64* 1.56* 1.50* 1.51* 1.46* 1.60* 1.48* 1.33* 1.39*  -Continue monitoring while on diuretics  Paroxysmal A. Fib: Now in sinus rhythm. -Continue home metoprolol, amiodarone and Eliquis. -May hold Eliquis if hemoptysis is worse.  Hemoptysis: Seems to have resolved. -Continue holding aspirin -May hold Eliquis if worse.  Essential hypertension: Normotensive for most part. -Continue metoprolol -Continue holding amlodipine and Imdur-not a great choice with a chronic venous insufficiency  History of CAD/chronic LBBB: No chest pain.  EKG without acute ischemic finding. -Continue metoprolol and a statin.  Stopped aspirin due to risk of bleeding with Eliquis  Generalized weakness/physical deconditioning: Lives with great granddaughter.  Uses walker at baseline. -PT/OT eval  Chronic venous insufficiency with LLE ulceration: POA.  No signs of infection. -Appreciate evaluation recommendation by WOCN -Check ABI -Diuretics as above  Morbid obesity-difficult situation. Body mass index is 55.11 kg/m.  -Encourage lifestyle change      DVT prophylaxis:  apixaban (ELIQUIS) tablet 5 mg   Code Status: Full code Family Communication: Patient and/or RN. Available if any question.  Level of care: Telemetry Medical Status is: Inpatient  Remains inpatient appropriate because:IV treatments appropriate due to intensity of illness or inability to take PO and Inpatient level of care appropriate due to severity of illness  Dispo: The patient is from: Home              Anticipated d/c is to: Home              Patient currently is not medically stable to d/c.   Difficult to place patient No       Consultants:  None   Sch Meds:  Scheduled Meds:  allopurinol  100 mg Oral Daily   amiodarone  200 mg Oral Daily   apixaban  5 mg Oral BID   budesonide (PULMICORT) nebulizer solution  0.5 mg Nebulization BID   docusate sodium  100 mg Oral BID   furosemide  40 mg Intravenous BID    guaiFENesin  600 mg Oral BID   metoprolol tartrate  12.5 mg Oral BID   oxybutynin  5 mg Oral BID   pantoprazole  40 mg Oral BID   rosuvastatin  10 mg Oral Daily   senna-docusate  1 tablet Oral BID   Continuous Infusions:  cefTRIAXone (ROCEPHIN)  IV 2 g (07/19/21 1621)   PRN Meds:.acetaminophen, famotidine, fluticasone, guaiFENesin-dextromethorphan, ipratropium-albuterol, oxyCODONE-acetaminophen, polyethylene glycol  Antimicrobials: Anti-infectives (From admission, onward)    Start     Dose/Rate Route Frequency Ordered Stop   07/17/21 1400  cefTRIAXone (ROCEPHIN) 2 g in sodium chloride 0.9 % 100 mL IVPB        2 g 200 mL/hr over 30 Minutes Intravenous Every 24 hours 07/16/21 1417 07/21/21 1359   07/17/21 1000  azithromycin (ZITHROMAX) tablet 500 mg  Status:  Discontinued        500 mg Oral Daily 07/16/21 1417 07/20/21 1009   07/16/21 1345  cefTRIAXone (ROCEPHIN) 2 g in sodium chloride 0.9 % 100 mL IVPB        2 g 200 mL/hr over 30 Minutes Intravenous  Once 07/16/21 1335  07/16/21 1426   07/16/21 1345  azithromycin (ZITHROMAX) 500 mg in sodium chloride 0.9 % 250 mL IVPB        500 mg 250 mL/hr over 60 Minutes Intravenous  Once 07/16/21 1335 07/16/21 1709        I have personally reviewed the following labs and images: CBC: Recent Labs  Lab 07/16/21 1138 07/19/21 0259 07/20/21 0241  WBC 4.0 6.4 6.3  NEUTROABS 1.6*  --   --   HGB 11.9* 12.0 12.4  HCT 38.7 36.6 38.1  MCV 97.7 93.8 94.3  PLT 196 191 203   BMP &GFR Recent Labs  Lab 07/16/21 1138 07/19/21 0259 07/20/21 0241  NA 140 141 140  K 4.0 3.5 3.5  CL 107 106 104  CO2 '28 27 28  '$ GLUCOSE 104* 106* 94  BUN '14 18 23  '$ CREATININE 1.48* 1.33* 1.39*  CALCIUM 8.6* 8.7* 8.6*  MG  --  2.2 2.4  PHOS  --  3.2 3.4   Estimated Creatinine Clearance: 53.9 mL/min (A) (by C-G formula based on SCr of 1.39 mg/dL (H)). Liver & Pancreas: Recent Labs  Lab 07/16/21 1138 07/19/21 0259 07/20/21 0241  AST 20  --   --   ALT 10   --   --   ALKPHOS 91  --   --   BILITOT 0.6  --   --   PROT 7.6  --   --   ALBUMIN 3.1* 2.8* 2.9*   No results for input(s): LIPASE, AMYLASE in the last 168 hours. No results for input(s): AMMONIA in the last 168 hours. Diabetic: No results for input(s): HGBA1C in the last 72 hours. Recent Labs  Lab 07/16/21 1118  GLUCAP 98   Cardiac Enzymes: No results for input(s): CKTOTAL, CKMB, CKMBINDEX, TROPONINI in the last 168 hours. No results for input(s): PROBNP in the last 8760 hours. Coagulation Profile: No results for input(s): INR, PROTIME in the last 168 hours. Thyroid Function Tests: No results for input(s): TSH, T4TOTAL, FREET4, T3FREE, THYROIDAB in the last 72 hours. Lipid Profile: No results for input(s): CHOL, HDL, LDLCALC, TRIG, CHOLHDL, LDLDIRECT in the last 72 hours. Anemia Panel: Recent Labs    07/19/21 0259  VITAMINB12 723  FOLATE 14.0  FERRITIN 24  TIBC 361  IRON 23*  RETICCTPCT 1.8   Urine analysis:    Component Value Date/Time   COLORURINE STRAW (A) 07/16/2021 2320   APPEARANCEUR CLEAR 07/16/2021 2320   LABSPEC 1.006 07/16/2021 2320   PHURINE 7.0 07/16/2021 2320   GLUCOSEU NEGATIVE 07/16/2021 2320   HGBUR NEGATIVE 07/16/2021 2320   BILIRUBINUR NEGATIVE 07/16/2021 2320   KETONESUR NEGATIVE 07/16/2021 2320   PROTEINUR NEGATIVE 07/16/2021 2320   UROBILINOGEN 0.2 02/07/2013 0203   NITRITE NEGATIVE 07/16/2021 2320   LEUKOCYTESUR LARGE (A) 07/16/2021 2320   Sepsis Labs: Invalid input(s): PROCALCITONIN, [place]  Microbiology: Recent Results (from the past 240 hour(s))  Blood culture (routine x 2)     Status: None (Preliminary result)   Collection Time: 07/16/21 11:28 AM   Specimen: BLOOD RIGHT ARM  Result Value Ref Range Status   Specimen Description BLOOD RIGHT ARM  Final   Special Requests   Final    BOTTLES DRAWN AEROBIC AND ANAEROBIC Blood Culture adequate volume   Culture   Final    NO GROWTH 3 DAYS Performed at [facility],  [address] [place], [place] [address]    Report Status PENDING  Incomplete  Blood culture (routine x 2)  Status: None (Preliminary result)   Collection Time: 07/16/21 11:42 AM   Specimen: BLOOD LEFT ARM  Result Value Ref Range Status   Specimen Description BLOOD LEFT ARM  Final   Special Requests   Final    BOTTLES DRAWN AEROBIC AND ANAEROBIC Blood Culture results may not be optimal due to an inadequate volume of blood received in culture bottles   Culture   Final    NO GROWTH 3 DAYS Performed at [facility], [place] [address] [place], [place] [address]    Report Status PENDING  Incomplete  Resp Panel by RT-PCR (Flu A&B, Covid) Nasopharyngeal Swab     Status: None   Collection Time: 07/16/21 11:46 AM   Specimen: Nasopharyngeal Swab; Nasopharyngeal(NP) swabs in vial transport medium  Result Value Ref Range Status   SARS Coronavirus 2 by RT PCR NEGATIVE NEGATIVE Final    Comment: (NOTE) SARS-CoV-2 target nucleic acids are NOT DETECTED.  The SARS-CoV-2 RNA is generally detectable in upper respiratory specimens during the acute phase of infection. The lowest concentration of SARS-CoV-2 viral copies this assay can detect is 138 copies/mL. A negative result does not preclude SARS-Cov-2 infection and should not be used as the sole basis for treatment or other patient management decisions. A negative result may occur with  improper specimen collection/handling, submission of specimen other than nasopharyngeal swab, presence of viral mutation(s) within the areas targeted by this assay, and inadequate number of viral copies(<138 copies/mL). A negative result must be combined with clinical observations, patient history, and epidemiological information. The expected result is Negative.  Fact Sheet for Patients:  EntrepreneurPulse.com.au  Fact Sheet for Healthcare Providers:  IncredibleEmployment.be  This test is no t yet approved or cleared by  the [place] FDA and  has been authorized for detection and/or diagnosis of SARS-CoV-2 by FDA under an Emergency Use Authorization (EUA). This EUA will remain  in effect (meaning this test can be used) for the duration of the COVID-19 declaration under Section 564(b)(1) of the Act, 21 U.S.C.section 360bbb-3(b)(1), unless the authorization is terminated  or revoked sooner.       Influenza A by PCR NEGATIVE NEGATIVE Final   Influenza B by PCR NEGATIVE NEGATIVE Final    Comment: (NOTE) The Xpert Xpress SARS-CoV-2/FLU/RSV plus assay is intended as an aid in the diagnosis of influenza from Nasopharyngeal swab specimens and should not be used as a sole basis for treatment. Nasal washings and aspirates are unacceptable for Xpert Xpress SARS-CoV-2/FLU/RSV testing.  Fact Sheet for Patients: EntrepreneurPulse.com.au  Fact Sheet for Healthcare Providers: IncredibleEmployment.be  This test is not yet approved or cleared by the [place] FDA and has been authorized for detection and/or diagnosis of SARS-CoV-2 by FDA under an Emergency Use Authorization (EUA). This EUA will remain in effect (meaning this test can be used) for the duration of the COVID-19 declaration under Section 564(b)(1) of the Act, 21 U.S.C. section 360bbb-3(b)(1), unless the authorization is terminated or revoked.  Performed at [facility], [place] [address] [place],  [address]     Radiology Studies: ECHOCARDIOGRAM LIMITED  Result Date: 07/19/2021    ECHOCARDIOGRAM LIMITED REPORT   Patient Name:   [name] Date of Exam: 07/19/2021 Medical Rec #:  [id number]             Height:       67.0 in Accession #:    [id number]            Weight:  368.6 lb Date of Birth:  09-Aug-1941            BSA:          2.622 m Patient Age:    70 years              BP:           131/57 mmHg Patient Gender: F                     HR:           75 bpm. Exam Location:   Inpatient Procedure: Limited Echo, Cardiac Doppler and Color Doppler Indications:    R06.02 SOB  History:        Patient has prior history of Echocardiogram examinations, most                 recent 07/19/2020. CAD and Previous Myocardial Infarction,                 Abnormal ECG and Pacemaker, TIA, Arrythmias:AV block and Atrial                 Fibrillation, Signs/Symptoms:Dyspnea and Shortness of Breath;                 Risk Factors:Hypertension and Dyslipidemia.  Sonographer:    Roseanna Rainbow RDCS Referring Phys: [id number] [facility]  Sonographer Comments: Patient is morbidly obese. Image acquisition challenging due to patient body habitus. IMPRESSIONS  1. Abnormal septal motion from pacing . Left ventricular ejection fraction, by estimation, is 55 to 60%. The left ventricle has normal function. The left ventricle has no regional wall motion abnormalities. There is severe left ventricular hypertrophy. Left ventricular diastolic parameters are indeterminate.  2. Pacing wires in RA/RV . Right ventricular systolic function is normal. The right ventricular size is normal. There is mildly elevated pulmonary artery systolic pressure.  3. Left atrial size was moderately dilated.  4. The mitral valve is normal in structure. No evidence of mitral valve regurgitation. No evidence of mitral stenosis.  5. The aortic valve is normal in structure. There is mild calcification of the aortic valve. Aortic valve regurgitation is not visualized. Mild aortic valve sclerosis is present, with no evidence of aortic valve stenosis.  6. Aortic dilatation noted. There is mild dilatation of the ascending aorta, measuring 40 mm.  7. The inferior vena cava is normal in size with greater than 50% respiratory variability, suggesting right atrial pressure of 3 mmHg. FINDINGS  Left Ventricle: Abnormal septal motion from pacing. Left ventricular ejection fraction, by estimation, is 55 to 60%. The left ventricle has normal function. The left ventricle  has no regional wall motion abnormalities. The left ventricular internal cavity size was normal in size. There is severe left ventricular hypertrophy. Left ventricular diastolic parameters are indeterminate. Right Ventricle: Pacing wires in RA/RV. The right ventricular size is normal. No increase in right ventricular wall thickness. Right ventricular systolic function is normal. There is mildly elevated pulmonary artery systolic pressure. The tricuspid regurgitant velocity is 2.84 m/s, and with an assumed right atrial pressure of 8 mmHg, the estimated right ventricular systolic pressure is 0000000 mmHg. Left Atrium: Left atrial size was moderately dilated. Right Atrium: Right atrial size was normal in size. Pericardium: There is no evidence of pericardial effusion. Mitral Valve: The mitral valve is normal in structure. No evidence of mitral valve stenosis. Tricuspid Valve: The tricuspid valve is normal in structure. Tricuspid valve regurgitation is not  demonstrated. No evidence of tricuspid stenosis. Aortic Valve: The aortic valve is normal in structure. There is mild calcification of the aortic valve. Aortic valve regurgitation is not visualized. Mild aortic valve sclerosis is present, with no evidence of aortic valve stenosis. Pulmonic Valve: The pulmonic valve was normal in structure. Pulmonic valve regurgitation is not visualized. No evidence of pulmonic stenosis. Aorta: Aortic dilatation noted. There is mild dilatation of the ascending aorta, measuring 40 mm. Venous: The inferior vena cava is normal in size with greater than 50% respiratory variability, suggesting right atrial pressure of 3 mmHg. IAS/Shunts: No atrial level shunt detected by color flow Doppler. LEFT VENTRICLE PLAX 2D LVIDd:         4.90 cm LVIDs:         3.30 cm LV PW:         1.60 cm LV IVS:        1.80 cm LVOT diam:     2.30 cm LVOT Area:     4.15 cm  LV Volumes (MOD) LV vol d, MOD A2C: 94.6 ml LV vol d, MOD A4C: 78.6 ml LV vol s, MOD A2C: 25.4  ml LV vol s, MOD A4C: 21.4 ml LV SV MOD A2C:     69.2 ml LV SV MOD A4C:     78.6 ml LV SV MOD BP:      63.4 ml IVC IVC diam: 2.70 cm LEFT ATRIUM         Index LA diam:    4.40 cm 1.68 cm/m   AORTA Ao Root diam: 3.90 cm Ao Asc diam:  4.00 cm MITRAL VALVE               TRICUSPID VALVE MV Area (PHT): 3.03 cm    TR Peak grad:   32.3 mmHg MV Decel Time: 250 msec    TR Vmax:        284.00 cm/s MV E velocity: 89.50 cm/s MV A velocity: 99.40 cm/s  SHUNTS MV E/A ratio:  0.90        Systemic Diam: 2.30 cm Jenkins Rouge MD Electronically signed by Jenkins Rouge MD Signature Date/Time: 07/19/2021/4:26:45 PM    Final      Cienna Dumais T. [place]  If 7PM-7AM, please contact night-coverage [email or web address] 07/20/2021, 12:02 PM

## 2021-07-21 ENCOUNTER — Inpatient Hospital Stay (HOSPITAL_COMMUNITY): Payer: Medicare Other

## 2021-07-21 DIAGNOSIS — L0390 Cellulitis, unspecified: Secondary | ICD-10-CM

## 2021-07-21 DIAGNOSIS — I70229 Atherosclerosis of native arteries of extremities with rest pain, unspecified extremity: Secondary | ICD-10-CM | POA: Diagnosis not present

## 2021-07-21 DIAGNOSIS — I5033 Acute on chronic diastolic (congestive) heart failure: Secondary | ICD-10-CM | POA: Diagnosis not present

## 2021-07-21 DIAGNOSIS — N1831 Chronic kidney disease, stage 3a: Secondary | ICD-10-CM | POA: Diagnosis not present

## 2021-07-21 DIAGNOSIS — J189 Pneumonia, unspecified organism: Secondary | ICD-10-CM | POA: Diagnosis not present

## 2021-07-21 DIAGNOSIS — I872 Venous insufficiency (chronic) (peripheral): Secondary | ICD-10-CM | POA: Diagnosis not present

## 2021-07-21 LAB — RENAL FUNCTION PANEL
Albumin: 2.9 g/dL — ABNORMAL LOW (ref 3.5–5.0)
Anion gap: 5 (ref 5–15)
BUN: 22 mg/dL (ref 8–23)
CO2: 31 mmol/L (ref 22–32)
Calcium: 8.5 mg/dL — ABNORMAL LOW (ref 8.9–10.3)
Chloride: 105 mmol/L (ref 98–111)
Creatinine, Ser: 1.32 mg/dL — ABNORMAL HIGH (ref 0.44–1.00)
GFR, Estimated: 42 mL/min — ABNORMAL LOW (ref >60)
Glucose, Bld: 86 mg/dL (ref 70–99)
Phosphorus: 3.5 mg/dL (ref 2.5–4.6)
Potassium: 3.3 mmol/L — ABNORMAL LOW (ref 3.5–5.1)
Sodium: 141 mmol/L (ref 135–145)

## 2021-07-21 LAB — CULTURE, BLOOD (ROUTINE X 2)
Culture: NO GROWTH
Culture: NO GROWTH
Special Requests: ADEQUATE

## 2021-07-21 LAB — HEMOGLOBIN AND HEMATOCRIT, BLOOD
HCT: 38.4 % (ref 36.0–46.0)
Hemoglobin: 12.2 g/dL (ref 12.0–15.0)

## 2021-07-21 LAB — MAGNESIUM: Magnesium: 2.1 mg/dL (ref 1.7–2.4)

## 2021-07-21 MED ORDER — ADULT MULTIVITAMIN W/MINERALS CH
1.0000 | ORAL_TABLET | Freq: Every day | ORAL | Status: DC
  Administered 2021-07-21 – 2021-07-23 (×3): 1 via ORAL
  Filled 2021-07-21 (×3): qty 1

## 2021-07-21 MED ORDER — POTASSIUM CHLORIDE CRYS ER 20 MEQ PO TBCR
40.0000 meq | EXTENDED_RELEASE_TABLET | ORAL | Status: AC
  Administered 2021-07-21 (×2): 40 meq via ORAL
  Filled 2021-07-21 (×2): qty 2

## 2021-07-21 NOTE — Progress Notes (Signed)
ABI has been completed.   Preliminary results in CV Proc.   Jinny Blossom Nicholaos Schippers 07/21/2021 2:13 PM

## 2021-07-21 NOTE — Progress Notes (Addendum)
Initial Nutrition Assessment  DOCUMENTATION CODES:   Morbid obesity  INTERVENTION:   -MVI with minerals daily -Double protein portions with meals -Educated pt on CHF and CHF management. Provided "Low Sodium Nutrition Therapy" handout from [facility] Nutrition Care Manual; attached to AVS/ discharge summary   NUTRITION DIAGNOSIS:   Increased nutrient needs related to wound healing as evidenced by estimated needs.  GOAL:   Patient will meet greater than or equal to 90% of their needs  MONITOR:   PO intake, Labs, Weight trends, Skin, I & O's  REASON FOR ASSESSMENT:   Consult Diet education  ASSESSMENT:   [name] is a 80 y.o. female with medical history significant of chronic diastolic CHF, CKD stage IIb, PAF on Eliquis, sick sinus syndrome status post pacemaker, HTN, CAD, presented with new onset of cough, wheezing, shortness of breath.  Pt admitted with CAP.  Reviewed I/O's: -690 ml x 24 hours and -4.6 L since admission  UOP: 1.7 L x 24 hours  Spoke with pt at bedside, who reports feeling better today. She has a very good appetite and has been consuming most of the meals she has been provided here. Documented meal completions 100%.   PTA pt consumes 3 meals per day. She explains that she has an aide who helps her during the weeks and helps with meal preparation (Breakfast: oatmeal of cold cereal (cheerios), fruit, and juice; Lunch and Dinner: sandwich, fruit, and juice). During the weekends, pt's daughter and granddaughter prepare her meals (Breakfast: oatmeal of cold cereal (cheerios), fruit, and juice; Lunch and Dinner: baked chicken, rice, and frozen vegetables). Family members season foods with Mrs Deliah Boston and pt has been avoiding canned products and vegetables for years to help control sodium in her diet.   Pt endorses wt gain secondary to fluid retention. She was not weighing herself PTA as she did not have a scale. She is able to ambulate with a walker. Per her  report, she will receive a scale prior to discharge. Pt able to verbalize importance of daily weight and importance of contacting MD for weight gain of 3# in a day and 5# in week. Pt gets her medications delivered to her house, which are organized in pill packets for her morning and evening meds; she states compliance with meds and is grateful for this service. Discussed importance of CHF self-management to prevent further hospitalizations.   RD provided "Low Sodium Nutrition Therapy" handout from the Academy of [employer]. Reviewed patient's dietary recall. Provided examples on ways to decrease sodium intake in diet. Discouraged intake of processed foods and use of salt shaker. Encouraged fresh fruits and vegetables as well as whole grain sources of carbohydrates to maximize fiber intake.   RD discussed why it is important for patient to adhere to diet recommendations, and emphasized the role of fluids, foods to avoid, and importance of weighing self daily. Teach back method used.  Expect fair to good compliance.  Obesity is a complex, chronic medical condition that is optimally managed by a multidisciplinary care team. Weight loss is not an ideal goal for an acute inpatient hospitalization. However, if further work-up for obesity is warranted, consider outpatient referral to outpatient bariatric service and/or [facility].    Pt shares she has had venous stasis ulcers and has been putting on ointment and wrapping them daily with assistance of caretakers. She sees a great improvement in wounds since starting this regimen.     Medications reviewed  and include colace, lasix, and senokot.   Labs reviewed: K: 3.3.    NUTRITION - FOCUSED PHYSICAL EXAM:  Flowsheet Row Most Recent Value  Orbital Region No depletion  Upper Arm Region No depletion  Thoracic and Lumbar Region No depletion  Buccal Region No depletion  Temple Region No depletion   Clavicle Bone Region No depletion  Clavicle and Acromion Bone Region No depletion  Scapular Bone Region No depletion  Dorsal Hand No depletion  Patellar Region No depletion  Anterior Thigh Region No depletion  Posterior Calf Region No depletion  Edema (RD Assessment) Moderate  Hair Reviewed  Eyes Reviewed  Mouth Reviewed  Skin Reviewed  Nails Reviewed       Diet Order:   Diet Order             Diet Heart Room service appropriate? Yes; Fluid consistency: Thin; Fluid restriction: 1500 mL Fluid  Diet effective now                   EDUCATION NEEDS:   Education needs have been addressed  Skin:  Skin Assessment: Skin Integrity Issues: Skin Integrity Issues:: Other (Comment) Other: LLE venous stasis ulcers  Last BM:  07/21/21  Height:   Ht Readings from Last 1 Encounters:  07/16/21 '5\' 7"'$  (1.702 m)    Weight:   Wt Readings from Last 1 Encounters:  07/21/21 (!) 164.2 kg    Ideal Body Weight:  61.4 kg  BMI:  Body mass index is 56.7 kg/m.  Estimated Nutritional Needs:   Kcal:  1850-2050  Protein:  105-120 grams  Fluid:  1.5 L    Loistine Chance, RD, LDN, [place] Registered Dietitian II Certified Diabetes Care and Education Specialist Please refer to [facility] for RD and/or RD on-call/weekend/after hours pager

## 2021-07-21 NOTE — Progress Notes (Signed)
PROGRESS NOTE  [name] [id number] DOB: 14-Jan-1941   PCP: Rocky Morel, MD  Patient is from: Home.  Lives with his great granddaughter.  Uses walker at baseline.  DOA: 07/16/2021 LOS: 5  Chief complaints:  Chief Complaint  Patient presents with   Dizziness     Brief Narrative / Interim history: 80 year old F with PMH of diastolic CHF, [id number], PAF on Eliquis, SSS/PPM, HTN, CAD, venous insufficiency, LLE ulcer, morbid obesity and ambulatory dysfunction presenting with shortness of breath, wheeze and productive cough, and admitted for community-acquired pneumonia, acute on chronic diastolic CHF and physical deconditioning.  Started on CTX, azithromycin and Lasix.   Patient's symptoms improved after escalating her Lasix to IV.   Subjective: Seen and examined earlier this morning.  No major events overnight.  She said she had chest pain from coughing last night.  Pain has improved this morning.  Denies shortness of breath and dizziness.  Leg swelling has improved.  Denies GI or UTI symptoms.  Objective: Vitals:   07/21/21 0414 07/21/21 0852 07/21/21 0914 07/21/21 1158  BP: (!) 151/77  (!) 117/59 (!) 154/91  Pulse: (!) 59   61  Resp: '20  18 18  '$ Temp: 98 F (36.7 C)   97.8 F (36.6 C)  TempSrc: Oral   Oral  SpO2: 95% 95% 95% 95%  Weight: (!) 164.2 kg     Height:        Intake/Output Summary (Last 24 hours) at 07/21/2021 1224 Last data filed at 07/21/2021 0100 Gross per 24 hour  Intake 720 ml  Output 1650 ml  Net -930 ml   Filed Weights   07/19/21 0437 07/20/21 0526 07/21/21 0414  Weight: (!) 167.2 kg (!) 159.6 kg (!) 164.2 kg    Examination:  GENERAL: No apparent distress.  Nontoxic. HEENT: MMM.  Vision and hearing grossly intact.  NECK: Supple.  No apparent JVD.  RESP: 95% on RA.  No IWOB.  Fair aeration bilaterally. CVS:  RRR. Heart sounds normal.  ABD/GI/GU: BS+. Abd soft, NTND.  MSK/EXT:  Moves extremities. No apparent deformity.   1+ BLE edema.  Chronic venous insufficiency. SKIN: BLE skin changes in keeping with chronic venous sufficiency.  Small LLE ulceration NEURO: Awake and alert. Oriented appropriately.  No apparent focal neuro deficit. PSYCH: Calm. Normal affect.   Procedures:  None  Microbiology summarized: [id number] and influenza PCR nonreactive.  Assessment & Plan: Acute on chronic diastolic CHF-Limited TTE with LVEF of 55 to 60%, severe LVH, indeterminate DD, moderate LAE and normal RVSF.  Presents with dyspnea, productive cough and wheeze.  She also have marked BLE edema.  She is on Lasix 40 mg twice daily but as needed.  Symptoms and CXR improved after starting IV Lasix.  About 1.7 L UOP/24 hours.  Net -4.6 L.  -Continue IV Lasix 40 mg twice daily -Monitor fluid status, renal functions and electrolytes. -Sodium and fluid restriction. -Check ABI to exclude PAD.  If negative, may consider wrapping legs and outpatient referral to [facility]  Possible CAP-presents with SOB/wheeze/productive cough with clear phlegm but no leukocytosis or fever to suggest infectious process.  Lactic acid within normal.  Pro-Cal negative. -Encourage incentive spirometry/OOB/PT/OT/antitussive and mucolytic's -Completed CAP coverage with CTX and azithromycin  Shortness of breath/productive cough/wheeze-likely due to CHF and possible pneumonia.   -Management as above  CKD-3B: Creatinine improving with diuretics. Recent Labs    09/20/20 0124 09/21/20 0142 09/22/20 0300 09/23/20 0230 09/23/20 1348 09/24/20 0036 07/16/21 1138 07/19/21 0259 07/20/21 0241  07/21/21 0732  BUN '13 13 12 14 14 19 14 18 23 22  '$ CREATININE 1.64* 1.56* 1.50* 1.51* 1.46* 1.60* 1.48* 1.33* 1.39* 1.32*  -Continue monitoring while on diuretics  Paroxysmal A. Fib: Now in sinus rhythm. -Continue home metoprolol, amiodarone and Eliquis.  Hemoptysis: Seems to have resolved. -Continue holding aspirin -May hold Eliquis if worse.  Essential hypertension:  Normotensive for most part. -Continue metoprolol -Continue holding amlodipine and Imdur-not a great choice with a chronic venous insufficiency  History of CAD/chronic LBBB: No chest pain.  EKG without acute ischemic finding. -Continue metoprolol and a statin.  Stopped aspirin due to risk of bleeding with Eliquis  Generalized weakness/physical deconditioning: Lives with great granddaughter.  Uses walker at baseline. -PT/OT eval  Chronic venous insufficiency with LLE ulceration: POA.  No signs of infection. -Appreciate evaluation recommendation by [facility] -Check ABI.  If negative, consider wrapping legs and outpatient follow-up with [facility] -Diuretics as above  Morbid obesity-difficult situation. Body mass index is 56.7 kg/m.  -Encourage lifestyle change      DVT prophylaxis:  apixaban (ELIQUIS) tablet 5 mg   Code Status: Full code Family Communication: Patient's daughter over the phone Level of care: Telemetry Medical Status is: Inpatient  Remains inpatient appropriate because:IV treatments appropriate due to intensity of illness or inability to take PO and Inpatient level of care appropriate due to severity of illness  Dispo: The patient is from: Home              Anticipated d/c is to: Home              Patient currently is not medically stable to d/c.   Difficult to place patient No       Consultants:  None   Sch Meds:  Scheduled Meds:  allopurinol  100 mg Oral Daily   amiodarone  200 mg Oral Daily   apixaban  5 mg Oral BID   budesonide (PULMICORT) nebulizer solution  0.5 mg Nebulization BID   docusate sodium  100 mg Oral BID   furosemide  40 mg Intravenous BID   guaiFENesin  600 mg Oral BID   metoprolol tartrate  12.5 mg Oral BID   oxybutynin  5 mg Oral BID   pantoprazole  40 mg Oral BID   potassium chloride  40 mEq Oral Q3H   rosuvastatin  10 mg Oral Daily   senna-docusate  1 tablet Oral BID   Continuous Infusions:   PRN Meds:.acetaminophen, famotidine,  fluticasone, guaiFENesin-dextromethorphan, ipratropium-albuterol, oxyCODONE-acetaminophen, polyethylene glycol  Antimicrobials: Anti-infectives (From admission, onward)    Start     Dose/Rate Route Frequency Ordered Stop   07/17/21 1400  cefTRIAXone (ROCEPHIN) 2 g in sodium chloride 0.9 % 100 mL IVPB        2 g 200 mL/hr over 30 Minutes Intravenous Every 24 hours 07/16/21 1417 07/20/21 1358   07/17/21 1000  azithromycin (ZITHROMAX) tablet 500 mg  Status:  Discontinued        500 mg Oral Daily 07/16/21 1417 07/20/21 1009   07/16/21 1345  cefTRIAXone (ROCEPHIN) 2 g in sodium chloride 0.9 % 100 mL IVPB        2 g 200 mL/hr over 30 Minutes Intravenous  Once 07/16/21 1335 07/16/21 1426   07/16/21 1345  azithromycin (ZITHROMAX) 500 mg in sodium chloride 0.9 % 250 mL IVPB        500 mg 250 mL/hr over 60 Minutes Intravenous  Once 07/16/21 1335 07/16/21 1709  I have personally reviewed the following labs and images: CBC: Recent Labs  Lab 07/16/21 1138 07/19/21 0259 07/20/21 0241 07/21/21 0732  WBC 4.0 6.4 6.3  --   NEUTROABS 1.6*  --   --   --   HGB 11.9* 12.0 12.4 12.2  HCT 38.7 36.6 38.1 38.4  MCV 97.7 93.8 94.3  --   PLT 196 191 203  --    BMP &GFR Recent Labs  Lab 07/16/21 1138 07/19/21 0259 07/20/21 0241 07/21/21 0732  NA 140 141 140 141  K 4.0 3.5 3.5 3.3*  CL 107 106 104 105  CO2 '28 27 28 31  '$ GLUCOSE 104* 106* 94 86  BUN '14 18 23 22  '$ CREATININE 1.48* 1.33* 1.39* 1.32*  CALCIUM 8.6* 8.7* 8.6* 8.5*  MG  --  2.2 2.4 2.1  PHOS  --  3.2 3.4 3.5   Estimated Creatinine Clearance: 57.8 mL/min (A) (by C-G formula based on SCr of 1.32 mg/dL (H)). Liver & Pancreas: Recent Labs  Lab 07/16/21 1138 07/19/21 0259 07/20/21 0241 07/21/21 0732  AST 20  --   --   --   ALT 10  --   --   --   ALKPHOS 91  --   --   --   BILITOT 0.6  --   --   --   PROT 7.6  --   --   --   ALBUMIN 3.1* 2.8* 2.9* 2.9*   No results for input(s): LIPASE, AMYLASE in the last 168  hours. No results for input(s): AMMONIA in the last 168 hours. Diabetic: No results for input(s): HGBA1C in the last 72 hours. Recent Labs  Lab 07/16/21 1118  GLUCAP 98   Cardiac Enzymes: No results for input(s): CKTOTAL, CKMB, CKMBINDEX, TROPONINI in the last 168 hours. No results for input(s): PROBNP in the last 8760 hours. Coagulation Profile: No results for input(s): INR, PROTIME in the last 168 hours. Thyroid Function Tests: No results for input(s): TSH, T4TOTAL, FREET4, T3FREE, THYROIDAB in the last 72 hours. Lipid Profile: No results for input(s): CHOL, HDL, LDLCALC, TRIG, CHOLHDL, LDLDIRECT in the last 72 hours. Anemia Panel: Recent Labs    07/19/21 0259  VITAMINB12 723  FOLATE 14.0  FERRITIN 24  TIBC 361  IRON 23*  RETICCTPCT 1.8   Urine analysis:    Component Value Date/Time   COLORURINE STRAW (A) 07/16/2021 2320   APPEARANCEUR CLEAR 07/16/2021 2320   LABSPEC 1.006 07/16/2021 2320   PHURINE 7.0 07/16/2021 2320   GLUCOSEU NEGATIVE 07/16/2021 2320   HGBUR NEGATIVE 07/16/2021 2320   BILIRUBINUR NEGATIVE 07/16/2021 2320   KETONESUR NEGATIVE 07/16/2021 2320   PROTEINUR NEGATIVE 07/16/2021 2320   UROBILINOGEN 0.2 02/07/2013 0203   NITRITE NEGATIVE 07/16/2021 2320   LEUKOCYTESUR LARGE (A) 07/16/2021 2320   Sepsis Labs: Invalid input(s): PROCALCITONIN, [place]  Microbiology: Recent Results (from the past 240 hour(s))  Blood culture (routine x 2)     Status: None (Preliminary result)   Collection Time: 07/16/21 11:28 AM   Specimen: BLOOD RIGHT ARM  Result Value Ref Range Status   Specimen Description BLOOD RIGHT ARM  Final   Special Requests   Final    BOTTLES DRAWN AEROBIC AND ANAEROBIC Blood Culture adequate volume   Culture   Final    NO GROWTH 4 DAYS Performed at [facility], [address] [place], [place] [address]    Report Status PENDING  Incomplete  Blood culture (routine x 2)  Status: None (Preliminary result)   Collection Time:  07/16/21 11:42 AM   Specimen: BLOOD LEFT ARM  Result Value Ref Range Status   Specimen Description BLOOD LEFT ARM  Final   Special Requests   Final    BOTTLES DRAWN AEROBIC AND ANAEROBIC Blood Culture results may not be optimal due to an inadequate volume of blood received in culture bottles   Culture   Final    NO GROWTH 4 DAYS Performed at [facility], [place] [address] [place], [place] [address]    Report Status PENDING  Incomplete  Resp Panel by RT-PCR (Flu A&B, Covid) Nasopharyngeal Swab     Status: None   Collection Time: 07/16/21 11:46 AM   Specimen: Nasopharyngeal Swab; Nasopharyngeal(NP) swabs in vial transport medium  Result Value Ref Range Status   SARS Coronavirus 2 by RT PCR NEGATIVE NEGATIVE Final    Comment: (NOTE) SARS-CoV-2 target nucleic acids are NOT DETECTED.  The SARS-CoV-2 RNA is generally detectable in upper respiratory specimens during the acute phase of infection. The lowest concentration of SARS-CoV-2 viral copies this assay can detect is 138 copies/mL. A negative result does not preclude SARS-Cov-2 infection and should not be used as the sole basis for treatment or other patient management decisions. A negative result may occur with  improper specimen collection/handling, submission of specimen other than nasopharyngeal swab, presence of viral mutation(s) within the areas targeted by this assay, and inadequate number of viral copies(<138 copies/mL). A negative result must be combined with clinical observations, patient history, and epidemiological information. The expected result is Negative.  Fact Sheet for Patients:  EntrepreneurPulse.com.au  Fact Sheet for Healthcare Providers:  IncredibleEmployment.be  This test is no t yet approved or cleared by the [place] FDA and  has been authorized for detection and/or diagnosis of SARS-CoV-2 by FDA under an Emergency Use Authorization (EUA). This EUA will remain   in effect (meaning this test can be used) for the duration of the COVID-19 declaration under Section 564(b)(1) of the Act, 21 U.S.C.section 360bbb-3(b)(1), unless the authorization is terminated  or revoked sooner.       Influenza A by PCR NEGATIVE NEGATIVE Final   Influenza B by PCR NEGATIVE NEGATIVE Final    Comment: (NOTE) The Xpert Xpress SARS-CoV-2/FLU/RSV plus assay is intended as an aid in the diagnosis of influenza from Nasopharyngeal swab specimens and should not be used as a sole basis for treatment. Nasal washings and aspirates are unacceptable for Xpert Xpress SARS-CoV-2/FLU/RSV testing.  Fact Sheet for Patients: EntrepreneurPulse.com.au  Fact Sheet for Healthcare Providers: IncredibleEmployment.be  This test is not yet approved or cleared by the [place] FDA and has been authorized for detection and/or diagnosis of SARS-CoV-2 by FDA under an Emergency Use Authorization (EUA). This EUA will remain in effect (meaning this test can be used) for the duration of the COVID-19 declaration under Section 564(b)(1) of the Act, 21 U.S.C. section 360bbb-3(b)(1), unless the authorization is terminated or revoked.  Performed at [facility], [place] [address], [place], [place] [address]     Radiology Studies: No results found.   Eriberto Felch T. Anthem  If 7PM-7AM, please contact night-coverage [email or web address] 07/21/2021, 12:24 PM

## 2021-07-21 NOTE — Discharge Instructions (Signed)

## 2021-07-21 NOTE — Care Management Important Message (Signed)
Important Message  Patient Details  Name: [name] MRN: [id number] Date of Birth: Apr 14, 1941   Medicare Important Message Given:  Yes     Shelda Altes 07/21/2021, 9:33 AM

## 2021-07-21 NOTE — Plan of Care (Signed)
  Problem: Education: Goal: Knowledge of General Education information will improve Description Including pain rating scale, medication(s)/side effects and non-pharmacologic comfort measures Outcome: Progressing   Problem: Health Behavior/Discharge Planning: Goal: Ability to manage health-related needs will improve Outcome: Progressing   

## 2021-07-22 DIAGNOSIS — J189 Pneumonia, unspecified organism: Secondary | ICD-10-CM | POA: Diagnosis not present

## 2021-07-22 DIAGNOSIS — I5033 Acute on chronic diastolic (congestive) heart failure: Secondary | ICD-10-CM | POA: Diagnosis not present

## 2021-07-22 DIAGNOSIS — N1831 Chronic kidney disease, stage 3a: Secondary | ICD-10-CM | POA: Diagnosis not present

## 2021-07-22 DIAGNOSIS — I872 Venous insufficiency (chronic) (peripheral): Secondary | ICD-10-CM | POA: Diagnosis not present

## 2021-07-22 LAB — RENAL FUNCTION PANEL
Albumin: 2.9 g/dL — ABNORMAL LOW (ref 3.5–5.0)
Anion gap: 6 (ref 5–15)
BUN: 23 mg/dL (ref 8–23)
CO2: 27 mmol/L (ref 22–32)
Calcium: 8.5 mg/dL — ABNORMAL LOW (ref 8.9–10.3)
Chloride: 106 mmol/L (ref 98–111)
Creatinine, Ser: 1.39 mg/dL — ABNORMAL HIGH (ref 0.44–1.00)
GFR, Estimated: 39 mL/min — ABNORMAL LOW (ref >60)
Glucose, Bld: 85 mg/dL (ref 70–99)
Phosphorus: 3.7 mg/dL (ref 2.5–4.6)
Potassium: 3.6 mmol/L (ref 3.5–5.1)
Sodium: 139 mmol/L (ref 135–145)

## 2021-07-22 LAB — HEMOGLOBIN AND HEMATOCRIT, BLOOD
HCT: 38.4 % (ref 36.0–46.0)
Hemoglobin: 12.5 g/dL (ref 12.0–15.0)

## 2021-07-22 LAB — MAGNESIUM: Magnesium: 2.2 mg/dL (ref 1.7–2.4)

## 2021-07-22 MED ORDER — POTASSIUM CHLORIDE CRYS ER 20 MEQ PO TBCR
40.0000 meq | EXTENDED_RELEASE_TABLET | Freq: Once | ORAL | Status: AC
  Administered 2021-07-22: 40 meq via ORAL
  Filled 2021-07-22: qty 2

## 2021-07-22 MED ORDER — TORSEMIDE 20 MG PO TABS
40.0000 mg | ORAL_TABLET | Freq: Two times a day (BID) | ORAL | Status: DC
  Administered 2021-07-22 (×2): 40 mg via ORAL
  Filled 2021-07-22 (×2): qty 2

## 2021-07-22 NOTE — Progress Notes (Signed)
PROGRESS NOTE  [name] [id number] DOB: 28-Nov-1940   PCP: Rocky Morel, MD  Patient is from: Home.  Lives with his great granddaughter.  Uses walker at baseline.  DOA: 07/16/2021 LOS: 6  Chief complaints:  Chief Complaint  Patient presents with   Dizziness     Brief Narrative / Interim history: 80 year old F with PMH of diastolic CHF, [id number], PAF on Eliquis, SSS/PPM, HTN, CAD, venous insufficiency, LLE ulcer, morbid obesity and ambulatory dysfunction presenting with shortness of breath, wheeze and productive cough, and admitted for community-acquired pneumonia, acute on chronic diastolic CHF and physical deconditioning.  Started on CTX, azithromycin and Lasix.   Patient's symptoms improved after escalating her Lasix to IV.   Subjective: Seen and examined earlier this morning.  No major events overnight of this morning.  Ambulated in hallway with mobility specialist.  She says she felt woozy after she sat down on bedside chair.  She denies chest pain.  She has some shortness of breath during mobility but not at rest.  She denies chest pain.  Denies GI or UTI symptoms.  Objective: Vitals:   07/22/21 0445 07/22/21 0746 07/22/21 0822 07/22/21 1122  BP: 127/68  (!) 113/94 121/78  Pulse: 60  (!) 59 61  Resp: '20  19 18  '$ Temp: 98 F (36.7 C)  98.1 F (36.7 C) 98.5 F (36.9 C)  TempSrc: Oral  Oral Oral  SpO2: 95% 92% 97% 96%  Weight: (!) 163.4 kg     Height:        Intake/Output Summary (Last 24 hours) at 07/22/2021 1416 Last data filed at 07/22/2021 0827 Gross per 24 hour  Intake 960 ml  Output 800 ml  Net 160 ml   Filed Weights   07/20/21 0526 07/21/21 0414 07/22/21 0445  Weight: (!) 159.6 kg (!) 164.2 kg (!) 163.4 kg    Examination:  GENERAL: No apparent distress.  Nontoxic. HEENT: MMM.  Vision and hearing grossly intact.  NECK: Supple.  No apparent JVD.  RESP: 96% on RA.  No IWOB.  Fair aeration bilaterally. CVS:  RRR. Heart sounds  normal.  ABD/GI/GU: BS+. Abd soft, NTND.  MSK/EXT:  Moves extremities. No apparent deformity.  Trace BLE edema SKIN: Skin changes/chronic venous insufficiency.  Small LLE ulceration. NEURO: Awake and alert. Oriented appropriately.  No apparent focal neuro deficit. PSYCH: Calm. Normal affect.   Procedures:  None  Microbiology summarized: [id number] and influenza PCR nonreactive.  Assessment & Plan: Acute on chronic diastolic CHF-Limited TTE with LVEF of 55 to 60%, severe LVH, indeterminate DD, moderate LAE and normal RVSF.  Presents with dyspnea, productive cough, wheeze and BLE edema.  Diuresed with IV Lasix with improvement in his symptoms.  I&O incomplete but about 800 cc and 2 unmeasured voids in the last 24 hours.  Net -4 L charted.  Symptoms and CXR improved after starting IV Lasix.  BP and creatinine is stable. -Transition to p.o. torsemide 40 mg daily -Monitor fluid status, renal functions and electrolytes. -Sodium and fluid restriction. -Appreciate teaching by dietitian  Possible CAP-presents with SOB/wheeze/productive cough with clear phlegm but no leukocytosis or fever to suggest infectious process.  Lactic acid within normal.  Pro-Cal negative. -Encourage incentive spirometry/OOB/PT/OT/antitussive and mucolytic's -Completed CAP coverage with CTX and azithromycin  Shortness of breath/productive cough/wheeze-likely due to CHF and possible pneumonia.  Improved. -Management as above  CKD-3B: Stable. Recent Labs    09/21/20 0142 09/22/20 0300 09/23/20 0230 09/23/20 1348 09/24/20 0036 07/16/21 1138 07/19/21 0259 07/20/21 0241  07/21/21 0732 07/22/21 0333  BUN '13 12 14 14 19 14 18 23 22 23  '$ CREATININE 1.56* 1.50* 1.51* 1.46* 1.60* 1.48* 1.33* 1.39* 1.32* 1.39*  -Continue monitoring while on diuretics  Paroxysmal A. Fib: Now in sinus rhythm. -Continue home metoprolol, amiodarone and Eliquis.  Hemoptysis: Resolved.  Essential hypertension: Normotensive for most  part. -Continue metoprolol and Imdur -Continue holding amlodipine-may discontinue this on discharge -Diuretics as above  History of CAD/chronic LBBB: No chest pain.  EKG without acute ischemic finding. -Continue metoprolol and a statin.  Stopped aspirin due to risk of bleeding with Eliquis  Generalized weakness/physical deconditioning: Lives with great granddaughter.  Uses walker at baseline. -PT/OT eval  Chronic venous insufficiency with LLE ulceration: POA.  Normal ABI.  No signs of infection.  -Appreciate evaluation recommendation by WOCN -Outpatient follow-up with [facility] -Diuretics as above  Morbid obesity-difficult situation. Body mass index is 56.42 kg/m. Nutrition Problem: Increased nutrient needs Etiology: wound healing Signs/Symptoms: estimated needs Interventions: MVI, Education, Refer to RD note for recommendations  DVT prophylaxis:  apixaban (ELIQUIS) tablet 5 mg   Code Status: Full code Family Communication: Patient's daughter over the phone Level of care: Telemetry Medical Status is: Inpatient  Remains inpatient appropriate because:Inpatient level of care appropriate due to severity of illness  Dispo: The patient is from: Home              Anticipated d/c is to: Home likely on 9/29 if she continues to do well on p.o. torsemide.              Patient currently is not medically stable to d/c.   Difficult to place patient No       Consultants:  None   Sch Meds:  Scheduled Meds:  allopurinol  100 mg Oral Daily   amiodarone  200 mg Oral Daily   apixaban  5 mg Oral BID   budesonide (PULMICORT) nebulizer solution  0.5 mg Nebulization BID   docusate sodium  100 mg Oral BID   guaiFENesin  600 mg Oral BID   metoprolol tartrate  12.5 mg Oral BID   multivitamin with minerals  1 tablet Oral Daily   oxybutynin  5 mg Oral BID   pantoprazole  40 mg Oral BID   rosuvastatin  10 mg Oral Daily   senna-docusate  1 tablet Oral BID   torsemide  40 mg Oral BID    Continuous Infusions:   PRN Meds:.acetaminophen, famotidine, fluticasone, guaiFENesin-dextromethorphan, ipratropium-albuterol, oxyCODONE-acetaminophen, polyethylene glycol  Antimicrobials: Anti-infectives (From admission, onward)    Start     Dose/Rate Route Frequency Ordered Stop   07/17/21 1400  cefTRIAXone (ROCEPHIN) 2 g in sodium chloride 0.9 % 100 mL IVPB        2 g 200 mL/hr over 30 Minutes Intravenous Every 24 hours 07/16/21 1417 07/20/21 1358   07/17/21 1000  azithromycin (ZITHROMAX) tablet 500 mg  Status:  Discontinued        500 mg Oral Daily 07/16/21 1417 07/20/21 1009   07/16/21 1345  cefTRIAXone (ROCEPHIN) 2 g in sodium chloride 0.9 % 100 mL IVPB        2 g 200 mL/hr over 30 Minutes Intravenous  Once 07/16/21 1335 07/16/21 1426   07/16/21 1345  azithromycin (ZITHROMAX) 500 mg in sodium chloride 0.9 % 250 mL IVPB        500 mg 250 mL/hr over 60 Minutes Intravenous  Once 07/16/21 1335 07/16/21 1709        I have personally  reviewed the following labs and images: CBC: Recent Labs  Lab 07/16/21 1138 07/19/21 0259 07/20/21 0241 07/21/21 0732 07/22/21 0333  WBC 4.0 6.4 6.3  --   --   NEUTROABS 1.6*  --   --   --   --   HGB 11.9* 12.0 12.4 12.2 12.5  HCT 38.7 36.6 38.1 38.4 38.4  MCV 97.7 93.8 94.3  --   --   PLT 196 191 203  --   --    BMP &GFR Recent Labs  Lab 07/16/21 1138 07/19/21 0259 07/20/21 0241 07/21/21 0732 07/22/21 0333  NA 140 141 140 141 139  K 4.0 3.5 3.5 3.3* 3.6  CL 107 106 104 105 106  CO2 '28 27 28 31 27  '$ GLUCOSE 104* 106* 94 86 85  BUN '14 18 23 22 23  '$ CREATININE 1.48* 1.33* 1.39* 1.32* 1.39*  CALCIUM 8.6* 8.7* 8.6* 8.5* 8.5*  MG  --  2.2 2.4 2.1 2.2  PHOS  --  3.2 3.4 3.5 3.7   Estimated Creatinine Clearance: 54.7 mL/min (A) (by C-G formula based on SCr of 1.39 mg/dL (H)). Liver & Pancreas: Recent Labs  Lab 07/16/21 1138 07/19/21 0259 07/20/21 0241 07/21/21 0732 07/22/21 0333  AST 20  --   --   --   --   ALT 10  --   --    --   --   ALKPHOS 91  --   --   --   --   BILITOT 0.6  --   --   --   --   PROT 7.6  --   --   --   --   ALBUMIN 3.1* 2.8* 2.9* 2.9* 2.9*   No results for input(s): LIPASE, AMYLASE in the last 168 hours. No results for input(s): AMMONIA in the last 168 hours. Diabetic: No results for input(s): HGBA1C in the last 72 hours. Recent Labs  Lab 07/16/21 1118  GLUCAP 98   Cardiac Enzymes: No results for input(s): CKTOTAL, CKMB, CKMBINDEX, TROPONINI in the last 168 hours. No results for input(s): PROBNP in the last 8760 hours. Coagulation Profile: No results for input(s): INR, PROTIME in the last 168 hours. Thyroid Function Tests: No results for input(s): TSH, T4TOTAL, FREET4, T3FREE, THYROIDAB in the last 72 hours. Lipid Profile: No results for input(s): CHOL, HDL, LDLCALC, TRIG, CHOLHDL, LDLDIRECT in the last 72 hours. Anemia Panel: No results for input(s): VITAMINB12, FOLATE, FERRITIN, TIBC, IRON, RETICCTPCT in the last 72 hours.  Urine analysis:    Component Value Date/Time   COLORURINE STRAW (A) 07/16/2021 2320   APPEARANCEUR CLEAR 07/16/2021 2320   LABSPEC 1.006 07/16/2021 2320   PHURINE 7.0 07/16/2021 2320   GLUCOSEU NEGATIVE 07/16/2021 2320   HGBUR NEGATIVE 07/16/2021 2320   BILIRUBINUR NEGATIVE 07/16/2021 2320   KETONESUR NEGATIVE 07/16/2021 2320   PROTEINUR NEGATIVE 07/16/2021 2320   UROBILINOGEN 0.2 02/07/2013 0203   NITRITE NEGATIVE 07/16/2021 2320   LEUKOCYTESUR LARGE (A) 07/16/2021 2320   Sepsis Labs: Invalid input(s): PROCALCITONIN, [place]  Microbiology: Recent Results (from the past 240 hour(s))  Blood culture (routine x 2)     Status: None   Collection Time: 07/16/21 11:28 AM   Specimen: BLOOD RIGHT ARM  Result Value Ref Range Status   Specimen Description BLOOD RIGHT ARM  Final   Special Requests   Final    BOTTLES DRAWN AEROBIC AND ANAEROBIC Blood Culture adequate volume   Culture   Final    NO GROWTH 5 DAYS Performed  at [facility], [place] [address], [place], [place] [address]    Report Status 07/21/2021 FINAL  Final  Blood culture (routine x 2)     Status: None   Collection Time: 07/16/21 11:42 AM   Specimen: BLOOD LEFT ARM  Result Value Ref Range Status   Specimen Description BLOOD LEFT ARM  Final   Special Requests   Final    BOTTLES DRAWN AEROBIC AND ANAEROBIC Blood Culture results may not be optimal due to an inadequate volume of blood received in culture bottles   Culture   Final    NO GROWTH 5 DAYS Performed at [facility], [place] [address], [place], [place] [address]    Report Status 07/21/2021 FINAL  Final  Resp Panel by RT-PCR (Flu A&B, Covid) Nasopharyngeal Swab     Status: None   Collection Time: 07/16/21 11:46 AM   Specimen: Nasopharyngeal Swab; Nasopharyngeal(NP) swabs in vial transport medium  Result Value Ref Range Status   SARS Coronavirus 2 by RT PCR NEGATIVE NEGATIVE Final    Comment: (NOTE) SARS-CoV-2 target nucleic acids are NOT DETECTED.  The SARS-CoV-2 RNA is generally detectable in upper respiratory specimens during the acute phase of infection. The lowest concentration of SARS-CoV-2 viral copies this assay can detect is 138 copies/mL. A negative result does not preclude SARS-Cov-2 infection and should not be used as the sole basis for treatment or other patient management decisions. A negative result may occur with  improper specimen collection/handling, submission of specimen other than nasopharyngeal swab, presence of viral mutation(s) within the areas targeted by this assay, and inadequate number of viral copies(<138 copies/mL). A negative result must be combined with clinical observations, patient history, and epidemiological information. The expected result is Negative.  Fact Sheet for Patients:  EntrepreneurPulse.com.au  Fact Sheet for Healthcare Providers:  IncredibleEmployment.be  This test is no t yet approved or cleared by the  [place] FDA and  has been authorized for detection and/or diagnosis of SARS-CoV-2 by FDA under an Emergency Use Authorization (EUA). This EUA will remain  in effect (meaning this test can be used) for the duration of the COVID-19 declaration under Section 564(b)(1) of the Act, 21 U.S.C.section 360bbb-3(b)(1), unless the authorization is terminated  or revoked sooner.       Influenza A by PCR NEGATIVE NEGATIVE Final   Influenza B by PCR NEGATIVE NEGATIVE Final    Comment: (NOTE) The Xpert Xpress SARS-CoV-2/FLU/RSV plus assay is intended as an aid in the diagnosis of influenza from Nasopharyngeal swab specimens and should not be used as a sole basis for treatment. Nasal washings and aspirates are unacceptable for Xpert Xpress SARS-CoV-2/FLU/RSV testing.  Fact Sheet for Patients: EntrepreneurPulse.com.au  Fact Sheet for Healthcare Providers: IncredibleEmployment.be  This test is not yet approved or cleared by the [place] FDA and has been authorized for detection and/or diagnosis of SARS-CoV-2 by FDA under an Emergency Use Authorization (EUA). This EUA will remain in effect (meaning this test can be used) for the duration of the COVID-19 declaration under Section 564(b)(1) of the Act, 21 U.S.C. section 360bbb-3(b)(1), unless the authorization is terminated or revoked.  Performed at [facility], [place] [address] [place],  [address]     Radiology Studies: No results found.   Florenda Watt T. [place]  If 7PM-7AM, please contact night-coverage [email or web address] 07/22/2021, 2:16 PM

## 2021-07-22 NOTE — Progress Notes (Signed)
Mobility Specialist Progress Note:   07/22/21 1200  Mobility  Activity Ambulated in hall  Range of Motion/Exercises Active;All extremities  Level of Assistance Standby assist, set-up cues, supervision of patient - no hands on  Assistive Device Front wheel walker  Minutes Ambulated 5 minutes  Distance Ambulated (ft) 60 ft  Mobility Ambulated with assistance in room  Mobility Response Tolerated well  Mobility performed by Mobility specialist  Bed Position Chair  Transport method Ambulatory  $Mobility charge 1 Mobility   Pt received in chair and was wiling to participate in mobility. Ambulated in hallway with 1 standing rest break at supervision with RW. During ambulation stated she was SOB. Once returned to room she said she was a little dizzy and still a little SOB.  Pt back in chair with call bell in reach and all needs met.   [facility] Health and safety inspector Phone [phone]

## 2021-07-22 NOTE — Progress Notes (Signed)
Physical Therapy Treatment Patient Details Name: [name] MRN: [id number] DOB: [birth year] Today's Date: 07/22/2021   History of Present Illness 80 y.o. female presented to ED 07/16/21 with new onset of cough, wheezing, shortness of breath.  In ED chest x-ray showed suspicious bilateral lower field infiltrates. IV antibiotics administered and pt admitted for treatment of CAP and acute on chronic diastolic CHP PMH: of chronic diastolic CHF, CKD stage IIb, PAF on Eliquis, sick sinus syndrome status post pacemaker, HTN, CAD.    PT Comments    Pt requested to focus session on exercises as she reports she ambulated with staff earlier. Discussed stair negotiation with RW with pt reporting she knew how to do it safely. She may benefit from practice in future sessions to ensure safety upon d/c home. Performed lower extremity exercises mentioned below. Pt fatigues quickly after each set of 3 reps of sit <> stands and relies heavily on her arms to push up, indicating lower extremity weakness. Pt with particular weakness in her gluteal muscles, having difficulty extending her hips coming to stand and with hip extension AROM exercises past 5 reps. Will continue to follow acutely. Current recommendations remain appropriate.    Recommendations for follow up therapy are one component of a multi-disciplinary discharge planning process, led by the attending physician.  Recommendations may be updated based on patient status, additional functional criteria and insurance authorization.  Follow Up Recommendations  Home health PT;Supervision/Assistance - 24 hour     Equipment Recommendations  None recommended by PT    Recommendations for Other Services       Precautions / Restrictions Precautions Precautions: Fall Restrictions Weight Bearing Restrictions: No     Mobility  Bed Mobility               General bed mobility comments: Pt sitting up in recliner upon arrival.     Transfers Overall transfer level: Needs assistance Equipment used: Rolling walker (2 wheeled) Transfers: Sit to/from Stand Sit to Stand: Min guard         General transfer comment: Increased time and min guard assist for safety with transfers to stand from recliner, relying heavily on UEs, x9 reps total.  Ambulation/Gait             General Gait Details: Deferred per pt request to focus on exercises.   Stairs         General stair comments: Discussed stair negotiation with RW with pt reporting she knows how to do it.   Wheelchair Mobility    Modified Rankin (Stroke Patients Only)       Balance Overall balance assessment: Needs assistance Sitting-balance support: Feet supported;No upper extremity supported Sitting balance-Leahy Scale: Good     Standing balance support: Bilateral upper extremity supported Standing balance-Leahy Scale: Poor Standing balance comment: Reliant on UE support on RW.                            Cognition Arousal/Alertness: Awake/alert Behavior During Therapy: WFL for tasks assessed/performed Overall Cognitive Status: Within Functional Limits for tasks assessed                                        Exercises General Exercises - Lower Extremity Ankle Circles/Pumps: AROM;Both;10 reps;Seated Gluteal Sets: Strengthening;Both;Standing;5 reps (with RW, hip extension) Hip ABduction/ADduction: Strengthening;Both;10 reps;Standing (with RW) Hip Flexion/Marching: Strengthening;Both;10 reps;Standing (  with RW)    General Comments General comments (skin integrity, edema, etc.): HR 60-70s      Pertinent Vitals/Pain Pain Assessment: Faces Faces Pain Scale: Hurts a little bit Pain Location: generalized Pain Descriptors / Indicators: Discomfort;Sore Pain Intervention(s): Limited activity within patient's tolerance;Monitored during session;Repositioned    Home Living                      Prior  Function            PT Goals (current goals can now be found in the care plan section) Acute Rehab PT Goals Patient Stated Goal: to get stronger and go back home PT Goal Formulation: With patient Time For Goal Achievement: 07/31/21 Potential to Achieve Goals: Fair Progress towards PT goals: Progressing toward goals    Frequency    Min 3X/week      PT Plan Current plan remains appropriate    Co-evaluation              AM-PAC PT "6 Clicks" Mobility   Outcome Measure  Help needed turning from your back to your side while in a flat bed without using bedrails?: None Help needed moving from lying on your back to sitting on the side of a flat bed without using bedrails?: A Little Help needed moving to and from a bed to a chair (including a wheelchair)?: A Little Help needed standing up from a chair using your arms (e.g., wheelchair or bedside chair)?: A Little Help needed to walk in hospital room?: A Little Help needed climbing 3-5 steps with a railing? : A Little 6 Click Score: 19    End of Session   Activity Tolerance: Patient tolerated treatment well;Patient limited by fatigue Patient left: in chair;with call bell/phone within reach   PT Visit Diagnosis: Unsteadiness on feet (R26.81);Muscle weakness (generalized) (M62.81);Difficulty in walking, not elsewhere classified (R26.2)     Time: [id number] PT Time Calculation (min) (ACUTE ONLY): 17 min  Charges:  $Therapeutic Exercise: 8-22 mins                     Moishe Spice, PT, DPT Acute Rehabilitation Services  Pager: [phone] Office: [place] 07/22/2021, 5:23 PM

## 2021-07-23 ENCOUNTER — Other Ambulatory Visit (HOSPITAL_COMMUNITY): Payer: Self-pay

## 2021-07-23 DIAGNOSIS — R0602 Shortness of breath: Secondary | ICD-10-CM | POA: Diagnosis not present

## 2021-07-23 DIAGNOSIS — R531 Weakness: Secondary | ICD-10-CM

## 2021-07-23 DIAGNOSIS — J189 Pneumonia, unspecified organism: Secondary | ICD-10-CM | POA: Diagnosis not present

## 2021-07-23 DIAGNOSIS — I10 Essential (primary) hypertension: Secondary | ICD-10-CM | POA: Diagnosis not present

## 2021-07-23 LAB — RENAL FUNCTION PANEL
Albumin: 3 g/dL — ABNORMAL LOW (ref 3.5–5.0)
Anion gap: 8 (ref 5–15)
BUN: 27 mg/dL — ABNORMAL HIGH (ref 8–23)
CO2: 26 mmol/L (ref 22–32)
Calcium: 8.6 mg/dL — ABNORMAL LOW (ref 8.9–10.3)
Chloride: 106 mmol/L (ref 98–111)
Creatinine, Ser: 1.56 mg/dL — ABNORMAL HIGH (ref 0.44–1.00)
GFR, Estimated: 34 mL/min — ABNORMAL LOW (ref >60)
Glucose, Bld: 101 mg/dL — ABNORMAL HIGH (ref 70–99)
Phosphorus: 4.1 mg/dL (ref 2.5–4.6)
Potassium: 3.4 mmol/L — ABNORMAL LOW (ref 3.5–5.1)
Sodium: 140 mmol/L (ref 135–145)

## 2021-07-23 LAB — BRAIN NATRIURETIC PEPTIDE: B Natriuretic Peptide: 23.7 pg/mL (ref 0.0–100.0)

## 2021-07-23 LAB — MAGNESIUM: Magnesium: 2.1 mg/dL (ref 1.7–2.4)

## 2021-07-23 MED ORDER — SENNOSIDES-DOCUSATE SODIUM 8.6-50 MG PO TABS: 1.0000 | ORAL_TABLET | Freq: Two times a day (BID) | ORAL | Status: DC | PRN

## 2021-07-23 MED ORDER — TORSEMIDE 20 MG PO TABS
40.0000 mg | ORAL_TABLET | Freq: Every day | ORAL | 1 refills | Status: DC
Start: 2021-07-24 — End: 2022-08-14
  Filled 2021-07-23: qty 180, 90d supply, fill #0

## 2021-07-23 MED ORDER — ALLOPURINOL 100 MG PO TABS
100.0000 mg | ORAL_TABLET | Freq: Every day | ORAL | 1 refills | Status: DC
  Filled 2021-07-23: qty 90, 90d supply, fill #0

## 2021-07-23 MED ORDER — METOPROLOL SUCCINATE ER 50 MG PO TB24
50.0000 mg | ORAL_TABLET | Freq: Every day | ORAL | 1 refills | Status: DC
  Filled 2021-07-23: qty 90, 90d supply, fill #0

## 2021-07-23 MED ORDER — ISOSORBIDE MONONITRATE ER 30 MG PO TB24
30.0000 mg | ORAL_TABLET | Freq: Every day | ORAL | 1 refills | Status: DC
  Filled 2021-07-23: qty 90, 90d supply, fill #0

## 2021-07-23 MED ORDER — POTASSIUM CHLORIDE CRYS ER 20 MEQ PO TBCR
40.0000 meq | EXTENDED_RELEASE_TABLET | Freq: Once | ORAL | Status: AC
  Administered 2021-07-23: 40 meq via ORAL
  Filled 2021-07-23: qty 2

## 2021-07-23 NOTE — Discharge Summary (Signed)
Physician Discharge Summary  [name] [id number] DOB: [birth year] DOA: 07/16/2021  PCP: Rocky Morel, MD  Admit date: 07/16/2021 Discharge date: 07/23/2021 Admitted From: Home Disposition: Home Recommendations for Outpatient Follow-up:  Follow ups as below. Please obtain CBC/BMP/Mag at follow up Please follow up on the following pending results: None Home Health: PT/OT/RN Equipment/Devices: Rolling walker Discharge Condition: Stable CODE STATUS: Full code  Follow-up [facility].   Specialty: [place] Why: They will call you with scheduled apt times Contact information: [address] [place] [address] [phone]         Rocky Morel, MD. Schedule an appointment as soon as possible for a visit on 07/28/2021.   Specialty: Internal Medicine Why: '@10'$ :15am Contact information: [address] [place] [address] [phone]                Hospital Course: 80 year old F with PMH of diastolic CHF, [id number], PAF on Eliquis, SSS/PPM, HTN, CAD, venous insufficiency, LLE ulcer, morbid obesity and ambulatory dysfunction presenting with shortness of breath, wheeze and productive cough, and admitted for community-acquired pneumonia, acute on chronic diastolic CHF and physical deconditioning.  Started on CTX, azithromycin and Lasix.    Patient's symptoms improved after escalating her Lasix to IV.  Discharged on p.o. torsemide.  Ambulatory referral to vascular and vein specialist ordered.  See individual problem list below for more on hospital course.  Discharge Diagnoses:  Acute on chronic diastolic CHF-Limited TTE with LVEF of 55 to 60%, severe LVH, indeterminate DD, moderate LAE and normal RVSF.  Presents with dyspnea, productive cough, wheeze and BLE edema.  Diuresed with IV Lasix with significant improvement in his symptoms and edema.  Weight down from 371 to 358  pounds. -Discharged on torsemide 40 mg daily -She will continue home potassium. -Counseled on sodium and fluid restriction as well as daily weight. -Outpatient follow-up with PCP and cardiology  Possible CAP-presents with SOB/wheeze/productive cough with clear phlegm but no leukocytosis or fever to suggest infectious process.  Lactic acid within normal.  Pro-Cal negative. -Completed CAP coverage with CTX and azithromycin   Shortness of breath/productive cough/wheeze-likely due to CHF and possible pneumonia.  Improved. -Management as above   CKD-3B: Stable. Recent Labs    09/22/20 0300 09/23/20 0230 09/23/20 1348 09/24/20 0036 07/16/21 1138 07/19/21 0259 07/20/21 0241 07/21/21 0732 07/22/21 0333 07/23/21 0409  BUN '12 14 14 19 14 18 23 22 23 '$ 27*  CREATININE 1.50* 1.51* 1.46* 1.60* 1.48* 1.33* 1.39* 1.32* 1.39* 1.56*  -Recheck renal function at follow-up.   Paroxysmal A. Fib: Now in sinus rhythm. -Decreased Toprol-XL to 25 mg daily-HR in low 60s on 25 mg twice daily -Continue home amiodarone and Eliquis   Hemoptysis: Resolved.   Essential hypertension: Normotensive for most part. -Discontinued amlodipine due to chronic venous insufficiency/edema -Decreased Imdur and amlodipine -Torsemide as above   History of CAD/chronic LBBB: No chest pain.  EKG without acute ischemic finding. -Cardiac meds as above. -Continue statin and aspirin   Generalized weakness/physical deconditioning: Lives with great granddaughter.  Uses walker at baseline. -Check PT/OT/RN/rolling walker   Chronic venous insufficiency with LLE ulceration: POA.  Normal ABI.  No signs of infection.  -Appreciate evaluation recommendation by [facility] care as below. -Ambulatory referral to vascular vein specialist -Diuretics as above   Morbid obesity-difficult situation. Body mass index is 56.16 kg/m. Nutrition Problem: Increased nutrient needs Etiology: wound healing Signs/Symptoms: estimated  needs Interventions: MVI,  Education, Refer to RD note for recommendations  Pressure Injury 09/19/20 Coccyx Stage 2 -  Partial thickness loss of dermis presenting as a shallow open injury with a red, pink wound bed without slough. slit measuring 1x1 cm (Active)  09/19/20 1803  Location: Coccyx  Location Orientation:   Staging: Stage 2 -  Partial thickness loss of dermis presenting as a shallow open injury with a red, pink wound bed without slough.  Wound Description (Comments): slit measuring 1x1 cm  Present on Admission: Yes    Discharge Exam: Vitals:   07/22/21 1122 07/22/21 2100 07/23/21 0100 07/23/21 0502  BP: 121/78 (!) 120/59  139/77  Pulse: 61 60  60  Temp: 98.5 F (36.9 C) 98.5 F (36.9 C)  98.6 F (37 C)  Resp: '18 18  16  '$ Height:      Weight:   (!) 162.7 kg   SpO2: 96% 97%  96%  TempSrc: Oral Oral  Oral  BMI (Calculated):   56.15      GENERAL: No apparent distress.  Nontoxic. HEENT: MMM.  Vision and hearing grossly intact.  NECK: Supple.  No apparent JVD.  RESP: 96% on RA.  No IWOB.  Fair aeration bilaterally. CVS:  RRR. Heart sounds normal.  ABD/GI/GU: Bowel sounds present. Soft. Non tender.  MSK/EXT:  Moves extremities. No apparent deformity. No edema.  SKIN: Skin change consistent with chronic venous insufficiency. NEURO: Awake and alert.  Oriented appropriately.  No apparent focal neuro deficit. PSYCH: Calm. Normal affect.   Discharge Instructions  Discharge Instructions     (HEART FAILURE PATIENTS) Call MD:  Anytime you have any of the following symptoms: 1) 3 pound weight gain in 24 hours or 5 pounds in 1 week 2) shortness of breath, with or without a dry hacking cough 3) swelling in the hands, feet or stomach 4) if you have to sleep on extra pillows at night in order to breathe.   Complete by: As directed    Ambulatory referral to Vascular Surgery   Complete by: As directed    Call MD for:  difficulty breathing, headache or visual disturbances   Complete  by: As directed    Call MD for:  extreme fatigue   Complete by: As directed    Call MD for:  persistant dizziness or light-headedness   Complete by: As directed    Diet - low sodium heart healthy   Complete by: As directed    Discharge instructions   Complete by: As directed    It has been a pleasure taking care of you!  You were hospitalized due to shortness of breath, cough and leg swelling.  You have been treated for heart failure exacerbation and possible pneumonia.  Your symptoms improved with treatment to the point we think it is safe to let you go home and follow-up with your primary care doctor and cardiologist.  We made some adjustment to your home medication during this hospitalization.  Please review your new medication list and the directions on your medications before you take them.  Follow-up with your primary care doctor in 1 to 2 weeks and your cardiologist in 2 to 3 weeks or sooner if needed.  We have also sent a referral to vascular and vein specialist about your leg swelling/venous insufficiency.  Someone will get in touch with you to set up this appointment over the next 1 to 2 weeks.  In addition to taking your medications as prescribed, we also recommend you avoid alcohol or  over-the-counter pain medication other than plain Tylenol, limit the amount of water/fluid you drink to less than 6 cups (1500 cc) a day,  limit your sodium (salt) intake to less than 2 g (2000 mg) a day and weigh yourself daily at the same time and keeping your weight log.     Take care,   Discharge wound care:   Complete by: As directed    Wash both legs with soap and water, rinse and pat dry. Apply Sween (pink top in clean supply) moisturizing cream to both LE except where she has open wounds. Place a Xeroform gauze on the LLE small ulcerative areas and wrap the  leg with Kerlix. Change daily.   Increase activity slowly   Complete by: As directed       Allergies as of 07/23/2021       Reactions    Ampicillin Other (See Comments)   Increases heart rhythm Has tolerated ceftriaxone in the past - 06/2020   Penicillins Other (See Comments)   Increases heart rhythm   Linzess [linaclotide] Other (See Comments)   Severe stomach pains        Medication List     STOP taking these medications    amLODipine 5 MG tablet Commonly known as: NORVASC   furosemide 40 MG tablet Commonly known as: LASIX   metoprolol tartrate 25 MG tablet Commonly known as: LOPRESSOR       TAKE these medications    acetaminophen 500 MG tablet Commonly known as: TYLENOL Take 1,000 mg by mouth every 6 (six) hours as needed for moderate pain.   albuterol 108 (90 Base) MCG/ACT inhaler Commonly known as: VENTOLIN HFA Inhale 2 puffs into the lungs every 6 (six) hours as needed for wheezing or shortness of breath.   allopurinol 100 MG tablet Commonly known as: ZYLOPRIM Take 1 tablet (100 mg total) by mouth daily. What changed:  medication strength how much to take   amiodarone 200 MG tablet Commonly known as: PACERONE Take 200 mg by mouth daily.   apixaban 5 MG Tabs tablet Commonly known as: ELIQUIS Take 5 mg by mouth 2 (two) times daily.   aspirin EC 81 MG tablet Take 81 mg by mouth daily.   diclofenac Sodium 1 % Gel Commonly known as: VOLTAREN Apply 1 application topically 2 (two) times daily as needed for pain.   famotidine 40 MG tablet Commonly known as: PEPCID Take 40 mg by mouth daily as needed for heartburn or indigestion.   fluticasone 50 MCG/ACT nasal spray Commonly known as: FLONASE Place 2 sprays into both nostrils at bedtime as needed for allergies or rhinitis.   isosorbide mononitrate 30 MG 24 hr tablet Commonly known as: IMDUR Take 1 tablet (30 mg total) by mouth daily. What changed:  medication strength how much to take   metoprolol succinate 50 MG 24 hr tablet Commonly known as: Toprol XL Take 1 tablet (50 mg total) by mouth daily. What changed:  medication  strength how much to take   nitroGLYCERIN 0.4 MG SL tablet Commonly known as: NITROSTAT Place 0.4 mg under the tongue every 5 (five) minutes as needed for chest pain.   oxybutynin 5 MG tablet Commonly known as: DITROPAN Take 5 mg by mouth 2 (two) times daily.   oxyCODONE-acetaminophen 5-325 MG tablet Commonly known as: PERCOCET/ROXICET Take 1 tablet by mouth every 8 (eight) hours as needed for moderate pain (pain).   pantoprazole 40 MG tablet Commonly known as: PROTONIX Take 40 mg by mouth  2 (two) times daily.   polyethylene glycol 17 g packet Commonly known as: MIRALAX / GLYCOLAX Take 17 g by mouth daily as needed for mild constipation.   Potassium Chloride ER 20 MEQ Tbcr Take 20 mEq by mouth 2 (two) times daily.   rosuvastatin 10 MG tablet Commonly known as: CRESTOR Take 10 mg by mouth daily.   senna-docusate 8.6-50 MG tablet Commonly known as: Senokot-S Take 1 tablet by mouth 2 (two) times daily as needed for moderate constipation.   torsemide 20 MG tablet Commonly known as: Demadex Take 2 tablets (40 mg total) by mouth daily. Start taking on: July 24, 2021               Discharge Care Instructions  (From admission, onward)           Start     Ordered   07/23/21 0000  Discharge wound care:       Comments: Wash both legs with soap and water, rinse and pat dry. Apply Sween (pink top in clean supply) moisturizing cream to both LE except where she has open wounds. Place a Xeroform gauze on the LLE small ulcerative areas and wrap the  leg with Kerlix. Change daily.   07/23/21 0813            Consultations: None  Procedures/Studies: None   DG Chest Port 1 View  Result Date: 07/19/2021 CLINICAL DATA:  Dyspnea.  History of CHF. EXAM: PORTABLE CHEST 1 VIEW COMPARISON:  07/16/2021 and older exams. FINDINGS: Stable mild enlargement of the cardiopericardial silhouette. Stable right anterior chest wall sequential pacemaker, leads projecting over  the right atrium and right ventricle. No mediastinal or hilar masses. No evidence of a pleural effusion or pneumothorax. Skeletal structures are grossly intact. IMPRESSION: No acute cardiopulmonary disease. Electronically Signed   By: Lajean Manes M.D.   On: 07/19/2021 08:47   DG Chest Port 1 View  Result Date: 07/16/2021 CLINICAL DATA:  Cough EXAM: PORTABLE CHEST 1 VIEW COMPARISON:  Chest x-ray dated September 19, 2020 FINDINGS: Enlarged cardiac and mediastinal contours are unchanged compared to prior. Right chest wall dual lead pacer with unchanged lead position. No focal lung opacity. Mild bilateral heterogeneous opacities. No large pleural effusion or pneumothorax. IMPRESSION: Mild bilateral heterogeneous opacities, possibly due to pulmonary edema versus atelectasis. Electronically Signed   By: Yetta Glassman M.D.   On: 07/16/2021 11:59   VAS [place] ABI WITH/WO TBI  Result Date: 07/21/2021  LOWER EXTREMITY DOPPLER STUDY Patient Name:  [name]  Date of Exam:   07/21/2021 Medical Rec #: [id number]              Accession #:    [id number] Date of Birth: June 06, 1941             Patient Gender: F Patient Age:   63 years Exam Location:  [facility] Procedure:      VAS [place] ABI WITH/WO TBI Referring Phys: Ilma Achee --------------------------------------------------------------------------------  Indications: Rest pain, and ulceration. High Risk         Hypertension, hyperlipidemia, Diabetes, coronary artery Factors:          disease.  Comparison Study: no prior Performing Technologist: Archie Patten RVS  Examination Guidelines: A complete evaluation includes at minimum, Doppler waveform signals and systolic blood pressure reading at the level of bilateral brachial, anterior tibial, and posterior tibial arteries, when vessel segments are accessible. Bilateral testing is considered an integral part of a complete examination. Photoelectric Plethysmograph (PPG)  waveforms and toe systolic pressure  readings are included as required and additional duplex testing as needed. Limited examinations for reoccurring indications may be performed as noted.  ABI Findings: +--------+------------------+-----+---------+--------+ Right   Rt Pressure (mmHg)IndexWaveform Comment  +--------+------------------+-----+---------+--------+ [id number]                    triphasic         +--------+------------------+-----+---------+--------+ PTA     153               1.14 triphasic         +--------+------------------+-----+---------+--------+ DP      139               1.04 triphasic         +--------+------------------+-----+---------+--------+ +--------+------------------+-----+---------+-------+ Left    Lt Pressure (mmHg)IndexWaveform Comment +--------+------------------+-----+---------+-------+ [id number]                    triphasic        +--------+------------------+-----+---------+-------+ PTA     173               1.29 triphasic        +--------+------------------+-----+---------+-------+ DP      162               1.21 triphasic        +--------+------------------+-----+---------+-------+ +-------+-----------+-----------+------------+------------+ ABI/TBIToday's ABIToday's TBIPrevious ABIPrevious TBI +-------+-----------+-----------+------------+------------+ Right  1.14                                           +-------+-----------+-----------+------------+------------+ Left   1.29                                           +-------+-----------+-----------+------------+------------+  Summary: Right: Resting right ankle-brachial index is within normal range. No evidence of significant right lower extremity arterial disease. Left: Resting left ankle-brachial index is within normal range. No evidence of significant left lower extremity arterial disease.  *See table(s) above for measurements and observations.  Electronically signed by Deitra Mayo MD on  07/21/2021 at 3:43:46 PM.    Final    ECHOCARDIOGRAM LIMITED  Result Date: 07/19/2021    ECHOCARDIOGRAM LIMITED REPORT   Patient Name:   [name] Date of Exam: 07/19/2021 Medical Rec #:  [id number]             Height:       67.0 in Accession #:    [id number]            Weight:       368.6 lb Date of Birth:  02/10/1941            BSA:          2.622 m Patient Age:    89 years              BP:           131/57 mmHg Patient Gender: F                     HR:           75 bpm. Exam Location:  Inpatient Procedure: Limited Echo, Cardiac Doppler and Color Doppler Indications:    R06.02 SOB  History:  Patient has prior history of Echocardiogram examinations, most                 recent 07/19/2020. CAD and Previous Myocardial Infarction,                 Abnormal ECG and Pacemaker, TIA, Arrythmias:AV block and Atrial                 Fibrillation, Signs/Symptoms:Dyspnea and Shortness of Breath;                 Risk Factors:Hypertension and Dyslipidemia.  Sonographer:    Roseanna Rainbow RDCS Referring Phys: [id number] [facility]  Sonographer Comments: Patient is morbidly obese. Image acquisition challenging due to patient body habitus. IMPRESSIONS  1. Abnormal septal motion from pacing . Left ventricular ejection fraction, by estimation, is 55 to 60%. The left ventricle has normal function. The left ventricle has no regional wall motion abnormalities. There is severe left ventricular hypertrophy. Left ventricular diastolic parameters are indeterminate.  2. Pacing wires in RA/RV . Right ventricular systolic function is normal. The right ventricular size is normal. There is mildly elevated pulmonary artery systolic pressure.  3. Left atrial size was moderately dilated.  4. The mitral valve is normal in structure. No evidence of mitral valve regurgitation. No evidence of mitral stenosis.  5. The aortic valve is normal in structure. There is mild calcification of the aortic valve. Aortic valve regurgitation is not  visualized. Mild aortic valve sclerosis is present, with no evidence of aortic valve stenosis.  6. Aortic dilatation noted. There is mild dilatation of the ascending aorta, measuring 40 mm.  7. The inferior vena cava is normal in size with greater than 50% respiratory variability, suggesting right atrial pressure of 3 mmHg. FINDINGS  Left Ventricle: Abnormal septal motion from pacing. Left ventricular ejection fraction, by estimation, is 55 to 60%. The left ventricle has normal function. The left ventricle has no regional wall motion abnormalities. The left ventricular internal cavity size was normal in size. There is severe left ventricular hypertrophy. Left ventricular diastolic parameters are indeterminate. Right Ventricle: Pacing wires in RA/RV. The right ventricular size is normal. No increase in right ventricular wall thickness. Right ventricular systolic function is normal. There is mildly elevated pulmonary artery systolic pressure. The tricuspid regurgitant velocity is 2.84 m/s, and with an assumed right atrial pressure of 8 mmHg, the estimated right ventricular systolic pressure is 0000000 mmHg. Left Atrium: Left atrial size was moderately dilated. Right Atrium: Right atrial size was normal in size. Pericardium: There is no evidence of pericardial effusion. Mitral Valve: The mitral valve is normal in structure. No evidence of mitral valve stenosis. Tricuspid Valve: The tricuspid valve is normal in structure. Tricuspid valve regurgitation is not demonstrated. No evidence of tricuspid stenosis. Aortic Valve: The aortic valve is normal in structure. There is mild calcification of the aortic valve. Aortic valve regurgitation is not visualized. Mild aortic valve sclerosis is present, with no evidence of aortic valve stenosis. Pulmonic Valve: The pulmonic valve was normal in structure. Pulmonic valve regurgitation is not visualized. No evidence of pulmonic stenosis. Aorta: Aortic dilatation noted. There is mild  dilatation of the ascending aorta, measuring 40 mm. Venous: The inferior vena cava is normal in size with greater than 50% respiratory variability, suggesting right atrial pressure of 3 mmHg. IAS/Shunts: No atrial level shunt detected by color flow Doppler. LEFT VENTRICLE PLAX 2D LVIDd:         4.90 cm LVIDs:  3.30 cm LV PW:         1.60 cm LV IVS:        1.80 cm LVOT diam:     2.30 cm LVOT Area:     4.15 cm  LV Volumes (MOD) LV vol d, MOD A2C: 94.6 ml LV vol d, MOD A4C: 78.6 ml LV vol s, MOD A2C: 25.4 ml LV vol s, MOD A4C: 21.4 ml LV SV MOD A2C:     69.2 ml LV SV MOD A4C:     78.6 ml LV SV MOD BP:      63.4 ml IVC IVC diam: 2.70 cm LEFT ATRIUM         Index LA diam:    4.40 cm 1.68 cm/m   AORTA Ao Root diam: 3.90 cm Ao Asc diam:  4.00 cm MITRAL VALVE               TRICUSPID VALVE MV Area (PHT): 3.03 cm    TR Peak grad:   32.3 mmHg MV Decel Time: 250 msec    TR Vmax:        284.00 cm/s MV E velocity: 89.50 cm/s MV A velocity: 99.40 cm/s  SHUNTS MV E/A ratio:  0.90        Systemic Diam: 2.30 cm Jenkins Rouge MD Electronically signed by Jenkins Rouge MD Signature Date/Time: 07/19/2021/4:26:45 PM    Final        The results of significant diagnostics from this hospitalization (including imaging, microbiology, ancillary and laboratory) are listed below for reference.     Microbiology: Recent Results (from the past 240 hour(s))  Blood culture (routine x 2)     Status: None   Collection Time: 07/16/21 11:28 AM   Specimen: BLOOD RIGHT ARM  Result Value Ref Range Status   Specimen Description BLOOD RIGHT ARM  Final   Special Requests   Final    BOTTLES DRAWN AEROBIC AND ANAEROBIC Blood Culture adequate volume   Culture   Final    NO GROWTH 5 DAYS Performed at [facility], [address] [place], [place] [address]    Report Status 07/21/2021 FINAL  Final  Blood culture (routine x 2)     Status: None   Collection Time: 07/16/21 11:42 AM   Specimen: BLOOD LEFT ARM  Result Value Ref Range  Status   Specimen Description BLOOD LEFT ARM  Final   Special Requests   Final    BOTTLES DRAWN AEROBIC AND ANAEROBIC Blood Culture results may not be optimal due to an inadequate volume of blood received in culture bottles   Culture   Final    NO GROWTH 5 DAYS Performed at [facility], [place] [address], [place], [place] [address]    Report Status 07/21/2021 FINAL  Final  Resp Panel by RT-PCR (Flu A&B, Covid) Nasopharyngeal Swab     Status: None   Collection Time: 07/16/21 11:46 AM   Specimen: Nasopharyngeal Swab; Nasopharyngeal(NP) swabs in vial transport medium  Result Value Ref Range Status   SARS Coronavirus 2 by RT PCR NEGATIVE NEGATIVE Final    Comment: (NOTE) SARS-CoV-2 target nucleic acids are NOT DETECTED.  The SARS-CoV-2 RNA is generally detectable in upper respiratory specimens during the acute phase of infection. The lowest concentration of SARS-CoV-2 viral copies this assay can detect is 138 copies/mL. A negative result does not preclude SARS-Cov-2 infection and should not be used as the sole basis for treatment or other patient management decisions. A  negative result may occur with  improper specimen collection/handling, submission of specimen other than nasopharyngeal swab, presence of viral mutation(s) within the areas targeted by this assay, and inadequate number of viral copies(<138 copies/mL). A negative result must be combined with clinical observations, patient history, and epidemiological information. The expected result is Negative.  Fact Sheet for Patients:  EntrepreneurPulse.com.au  Fact Sheet for Healthcare Providers:  IncredibleEmployment.be  This test is no t yet approved or cleared by the [place] FDA and  has been authorized for detection and/or diagnosis of SARS-CoV-2 by FDA under an Emergency Use Authorization (EUA). This EUA will remain  in effect (meaning this test can be used) for the duration of  the COVID-19 declaration under Section 564(b)(1) of the Act, 21 U.S.C.section 360bbb-3(b)(1), unless the authorization is terminated  or revoked sooner.       Influenza A by PCR NEGATIVE NEGATIVE Final   Influenza B by PCR NEGATIVE NEGATIVE Final    Comment: (NOTE) The Xpert Xpress SARS-CoV-2/FLU/RSV plus assay is intended as an aid in the diagnosis of influenza from Nasopharyngeal swab specimens and should not be used as a sole basis for treatment. Nasal washings and aspirates are unacceptable for Xpert Xpress SARS-CoV-2/FLU/RSV testing.  Fact Sheet for Patients: EntrepreneurPulse.com.au  Fact Sheet for Healthcare Providers: IncredibleEmployment.be  This test is not yet approved or cleared by the [place] FDA and has been authorized for detection and/or diagnosis of SARS-CoV-2 by FDA under an Emergency Use Authorization (EUA). This EUA will remain in effect (meaning this test can be used) for the duration of the COVID-19 declaration under Section 564(b)(1) of the Act, 21 U.S.C. section 360bbb-3(b)(1), unless the authorization is terminated or revoked.  Performed at [facility], [place] [address], [place], [place] [address]      Labs:  CBC: Recent Labs  Lab 07/19/21 0259 07/20/21 0241 07/21/21 0732 07/22/21 0333  WBC 6.4 6.3  --   --   HGB 12.0 12.4 12.2 12.5  HCT 36.6 38.1 38.4 38.4  MCV 93.8 94.3  --   --   PLT 191 203  --   --    BMP &GFR Recent Labs  Lab 07/19/21 0259 07/20/21 0241 07/21/21 0732 07/22/21 0333 07/23/21 0409  NA 141 140 141 139 140  K 3.5 3.5 3.3* 3.6 3.4*  CL 106 104 105 106 106  CO2 '27 28 31 27 26  '$ GLUCOSE 106* 94 86 85 101*  BUN '18 23 22 23 '$ 27*  CREATININE 1.33* 1.39* 1.32* 1.39* 1.56*  CALCIUM 8.7* 8.6* 8.5* 8.5* 8.6*  MG 2.2 2.4 2.1 2.2 2.1  PHOS 3.2 3.4 3.5 3.7 4.1   Estimated Creatinine Clearance: 48.6 mL/min (A) (by C-G formula based on SCr of 1.56 mg/dL (H)). Liver &  Pancreas: Recent Labs  Lab 07/19/21 0259 07/20/21 0241 07/21/21 0732 07/22/21 0333 07/23/21 0409  ALBUMIN 2.8* 2.9* 2.9* 2.9* 3.0*   No results for input(s): LIPASE, AMYLASE in the last 168 hours. No results for input(s): AMMONIA in the last 168 hours. Diabetic: No results for input(s): HGBA1C in the last 72 hours. No results for input(s): GLUCAP in the last 168 hours. Cardiac Enzymes: No results for input(s): CKTOTAL, CKMB, CKMBINDEX, TROPONINI in the last 168 hours. No results for input(s): PROBNP in the last 8760 hours. Coagulation Profile: No results for input(s): INR, PROTIME in the last 168 hours. Thyroid Function Tests: No results for input(s): TSH, T4TOTAL, FREET4, T3FREE, THYROIDAB in the last 72 hours. Lipid Profile: No  results for input(s): CHOL, HDL, LDLCALC, TRIG, CHOLHDL, LDLDIRECT in the last 72 hours. Anemia Panel: No results for input(s): VITAMINB12, FOLATE, FERRITIN, TIBC, IRON, RETICCTPCT in the last 72 hours. Urine analysis:    Component Value Date/Time   COLORURINE STRAW (A) 07/16/2021 2320   APPEARANCEUR CLEAR 07/16/2021 2320   LABSPEC 1.006 07/16/2021 2320   PHURINE 7.0 07/16/2021 2320   GLUCOSEU NEGATIVE 07/16/2021 2320   HGBUR NEGATIVE 07/16/2021 2320   BILIRUBINUR NEGATIVE 07/16/2021 2320   KETONESUR NEGATIVE 07/16/2021 2320   PROTEINUR NEGATIVE 07/16/2021 2320   UROBILINOGEN 0.2 02/07/2013 0203   NITRITE NEGATIVE 07/16/2021 2320   LEUKOCYTESUR LARGE (A) 07/16/2021 2320   Sepsis Labs: Invalid input(s): PROCALCITONIN, LACTICIDVEN   Time coordinating discharge: 55 minutes  SIGNED:  Mercy Riding, MD  [facility] 07/23/2021, 6:43 PM

## 2021-07-23 NOTE — TOC Transition Note (Addendum)
Transition of Care [facility]) - CM/SW Discharge Note   Patient Details  Name: JOLETTA PERRIER MRN: [id number] Date of Birth: [birth year]  [facility] of [facility]) CM/SW Contact:  Zenon Mayo, RN Phone Number: 07/23/2021, 9:10 AM   Clinical Narrative:    NCM notified [place] of patient's dc today.    Final next level of care: [place] Barriers to Discharge: No Barriers Identified   Patient Goals and CMS Choice Patient states their goals for this hospitalization and ongoing recovery are:: return home CMS Medicare.gov Compare Post Acute Care list provided to:: Patient Choice offered to / list presented to : Patient  Discharge Placement  NCM notified [facility] that patient is for dc today.                       Discharge Plan and Services   Discharge Planning Services: CM Consult Post Acute Care Choice: Durable Medical Equipment, Home Health          DME Arranged: [place] rolling DME Agency: [employer] Date DME Agency Contacted: 07/17/21 Time DME Agency Contacted: [phone] Representative spoke with at DME Agency: [place]: PT, RN, Disease Management [place] Agency: [place] Date [place]: 07/17/21 Time [place]: 1615 Representative spoke with at [place]: intake representative  Social Determinants of Health ([place]) Interventions     Readmission Risk Interventions Readmission Risk Prevention Plan 05/28/2020  Transportation Screening Complete  PCP or Specialist Appt within 3-5 Days Complete  HRI or [place] Complete  Social Work Consult for [place] Planning/Counseling Complete  Palliative Care Screening Not Applicable  Medication Review Press photographer) Complete  Some recent data might be hidden

## 2021-07-23 NOTE — Progress Notes (Signed)
Mobility Specialist Progress Note:   07/23/21 1010  Mobility  Activity Refused mobility   Pt stated she was being discharged and did not want to walk or participate in mobility.   [facility] Health and safety inspector Phone [phone]

## 2021-07-24 ENCOUNTER — Other Ambulatory Visit (HOSPITAL_COMMUNITY): Payer: Self-pay

## 2021-08-17 ENCOUNTER — Emergency Department (HOSPITAL_COMMUNITY): Payer: Medicare Other

## 2021-08-17 ENCOUNTER — Other Ambulatory Visit: Payer: Self-pay

## 2021-08-17 ENCOUNTER — Observation Stay (HOSPITAL_COMMUNITY)
Admission: EM | Admit: 2021-08-17 | Discharge: 2021-08-18 | Disposition: A | Discharge status: Home / Self Care | Payer: Medicare Other | Attending: Emergency Medicine | Admitting: Emergency Medicine

## 2021-08-17 DIAGNOSIS — R55 Syncope and collapse: Secondary | ICD-10-CM | POA: Insufficient documentation

## 2021-08-17 DIAGNOSIS — Z8673 Personal history of transient ischemic attack (TIA), and cerebral infarction without residual deficits: Secondary | ICD-10-CM | POA: Diagnosis not present

## 2021-08-17 DIAGNOSIS — R079 Chest pain, unspecified: Secondary | ICD-10-CM

## 2021-08-17 DIAGNOSIS — I110 Hypertensive heart disease with heart failure: Secondary | ICD-10-CM | POA: Diagnosis not present

## 2021-08-17 DIAGNOSIS — Z20822 Contact with and (suspected) exposure to covid-19: Secondary | ICD-10-CM | POA: Diagnosis not present

## 2021-08-17 DIAGNOSIS — I480 Paroxysmal atrial fibrillation: Secondary | ICD-10-CM | POA: Diagnosis not present

## 2021-08-17 DIAGNOSIS — I5043 Acute on chronic combined systolic (congestive) and diastolic (congestive) heart failure: Secondary | ICD-10-CM | POA: Diagnosis not present

## 2021-08-17 DIAGNOSIS — Z7901 Long term (current) use of anticoagulants: Secondary | ICD-10-CM | POA: Diagnosis not present

## 2021-08-17 DIAGNOSIS — I2510 Atherosclerotic heart disease of native coronary artery without angina pectoris: Secondary | ICD-10-CM | POA: Diagnosis not present

## 2021-08-17 DIAGNOSIS — Z7982 Long term (current) use of aspirin: Secondary | ICD-10-CM | POA: Diagnosis not present

## 2021-08-17 DIAGNOSIS — Z79899 Other long term (current) drug therapy: Secondary | ICD-10-CM | POA: Insufficient documentation

## 2021-08-17 DIAGNOSIS — Z950 Presence of cardiac pacemaker: Secondary | ICD-10-CM | POA: Insufficient documentation

## 2021-08-17 DIAGNOSIS — R110 Nausea: Secondary | ICD-10-CM | POA: Diagnosis not present

## 2021-08-17 DIAGNOSIS — R002 Palpitations: Secondary | ICD-10-CM

## 2021-08-17 DIAGNOSIS — R0789 Other chest pain: Secondary | ICD-10-CM | POA: Diagnosis not present

## 2021-08-17 DIAGNOSIS — R0602 Shortness of breath: Secondary | ICD-10-CM | POA: Diagnosis present

## 2021-08-17 DIAGNOSIS — I10 Essential (primary) hypertension: Secondary | ICD-10-CM | POA: Diagnosis present

## 2021-08-17 DIAGNOSIS — R9431 Abnormal electrocardiogram [ECG] [EKG]: Secondary | ICD-10-CM

## 2021-08-17 LAB — CBC WITH DIFFERENTIAL/PLATELET
Abs Immature Granulocytes: 0.02 10*3/uL (ref 0.00–0.07)
Basophils Absolute: 0.1 10*3/uL (ref 0.0–0.1)
Basophils Relative: 1 %
Eosinophils Absolute: 0.2 10*3/uL (ref 0.0–0.5)
Eosinophils Relative: 5 %
HCT: 40.6 % (ref 36.0–46.0)
Hemoglobin: 12.9 g/dL (ref 12.0–15.0)
Immature Granulocytes: 0 %
Lymphocytes Relative: 50 %
Lymphs Abs: 2.4 10*3/uL (ref 0.7–4.0)
MCH: 31.5 pg (ref 26.0–34.0)
MCHC: 31.8 g/dL (ref 30.0–36.0)
MCV: 99 fL (ref 80.0–100.0)
Monocytes Absolute: 0.5 10*3/uL (ref 0.1–1.0)
Monocytes Relative: 10 %
Neutro Abs: 1.6 10*3/uL — ABNORMAL LOW (ref 1.7–7.7)
Neutrophils Relative %: 34 %
Platelets: 118 10*3/uL — ABNORMAL LOW (ref 150–400)
RBC: 4.1 MIL/uL (ref 3.87–5.11)
RDW: 18.3 % — ABNORMAL HIGH (ref 11.5–15.5)
WBC: 4.8 10*3/uL (ref 4.0–10.5)
nRBC: 0 % (ref 0.0–0.2)

## 2021-08-17 LAB — RESP PANEL BY RT-PCR (FLU A&B, COVID) ARPGX2
Influenza A by PCR: NEGATIVE
Influenza B by PCR: NEGATIVE
SARS Coronavirus 2 by RT PCR: NEGATIVE

## 2021-08-17 LAB — COMPREHENSIVE METABOLIC PANEL
ALT: 9 U/L (ref 0–44)
AST: 21 U/L (ref 15–41)
Albumin: 3.6 g/dL (ref 3.5–5.0)
Alkaline Phosphatase: 84 U/L (ref 38–126)
Anion gap: 14 (ref 5–15)
BUN: 14 mg/dL (ref 8–23)
CO2: 24 mmol/L (ref 22–32)
Calcium: 8.9 mg/dL (ref 8.9–10.3)
Chloride: 102 mmol/L (ref 98–111)
Creatinine, Ser: 1.67 mg/dL — ABNORMAL HIGH (ref 0.44–1.00)
GFR, Estimated: 31 mL/min — ABNORMAL LOW (ref >60)
Glucose, Bld: 109 mg/dL — ABNORMAL HIGH (ref 70–99)
Potassium: 3.5 mmol/L (ref 3.5–5.1)
Sodium: 140 mmol/L (ref 135–145)
Total Bilirubin: 0.8 mg/dL (ref 0.3–1.2)
Total Protein: 7.7 g/dL (ref 6.5–8.1)

## 2021-08-17 LAB — TROPONIN I (HIGH SENSITIVITY)
Troponin I (High Sensitivity): 7 ng/L (ref <18)
Troponin I (High Sensitivity): 12 ng/L (ref <18)

## 2021-08-17 LAB — TSH: TSH: 1.469 u[IU]/mL (ref 0.350–4.500)

## 2021-08-17 LAB — LACTIC ACID, PLASMA: Lactic Acid, Venous: 1.6 mmol/L (ref 0.5–1.9)

## 2021-08-17 LAB — LIPASE, BLOOD: Lipase: 21 U/L (ref 11–51)

## 2021-08-17 LAB — BRAIN NATRIURETIC PEPTIDE: B Natriuretic Peptide: 59 pg/mL (ref 0.0–100.0)

## 2021-08-17 NOTE — ED Provider Notes (Signed)
Emergency Medicine Provider Triage Evaluation Note  [name] , a 80 y.o. female  was evaluated in triage.  Pt complains of generalized weakness, nausea, vomiting, cough, chest pain, dizziness, palpitations.  Symptoms began 2 days ago.  Palpitations have improved, but not completely resolved.  No sick contacts.  She is vaccinated for COVID.  Review of Systems  Positive: N/v, cp, dizziness, weakness Negative: fever  Physical Exam  BP (!) 175/74   Pulse 72   Temp (!) 97.4 F (36.3 C)   Resp 18   SpO2 100%  Gen:   Awake, no distress   Resp:  Normal effort  MSK:   Moves extremities without difficulty  Other:  No ttp of abd. RRR Medical Decision Making  Medically screening exam initiated at 12:19 PM.  Appropriate orders placed.  PAHOUA SCHREINER was informed that the remainder of the evaluation will be completed by another provider, this initial triage assessment does not replace that evaluation, and the importance of remaining in the ED until their evaluation is complete.  Labs, ekg, cxr, covid   Franchot Heidelberg, PA-C 08/17/21 1222    Daleen Bo, MD 08/17/21 [phone]

## 2021-08-17 NOTE — ED Provider Notes (Signed)
[facility] EMERGENCY DEPARTMENT Provider Note   CSN: [id number] Arrival date & time: 08/17/21  1158     History Chief Complaint  Patient presents with   Emesis   Weakness   Palpitations    [name] is a 80 y.o. female.  The history is provided by the patient and medical records. No language interpreter was used.  Emesis Severity:  Mild Duration:  1 week Timing:  Intermittent Quality:  Stomach contents Progression:  Unchanged Chronicity:  New Associated symptoms: chills, cough, diarrhea and headaches   Associated symptoms: no abdominal pain and no fever   Weakness Severity:  Moderate Onset quality:  Gradual Duration:  1 week Timing:  Intermittent Progression:  Waxing and waning Chronicity:  New Relieved by:  Nothing Worsened by:  Nothing Ineffective treatments:  None tried Associated symptoms: chest pain, cough, diarrhea, frequency, headaches, nausea, near-syncope, shortness of breath and vomiting   Associated symptoms: no abdominal pain, no dizziness, no dysuria, no numbness in extremities, no falls, no fever, no foul-smelling urine, no loss of consciousness, no stroke symptoms and no urgency   Palpitations Palpitations quality:  Fast Onset quality:  At rest Timing:  Intermittent Progression:  Waxing and waning Chronicity:  New Relieved by:  Nothing Worsened by:  Nothing Ineffective treatments:  None tried Associated symptoms: chest pain, chest pressure, cough, malaise/fatigue, nausea, near-syncope, shortness of breath, vomiting and weakness   Associated symptoms: no back pain, no diaphoresis, no dizziness, no lower extremity edema and no numbness       Past Medical History:  Diagnosis Date   Arthritis    CHF (congestive heart failure) ([place])    05/2011 echo severe concentric LVH, normal systolic fxn, grade 1 diastolic dysfxn, paradoxical septal motion   Coronary artery disease    Cath 04/2006 normal coronaries, mild-mod slow  flow in all coronaries with slow distall runoff, mild-mod LV enlargement, EF 55%    Depression    Dyspnea    Dysrhythmia    GERD (gastroesophageal reflux disease)    Hypercholesterolemia    Hyperlipemia    Hypertension    MI (myocardial infarction) ([place])    NOT SURE HOW MANY   Morbid obesity ([place])    Nocturia    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   Symptomatic sinus bradycardia 03/27/2014  Pacemaker at end of battery life 03/27/2014   CVA (cerebral infarction) 10/17/2012   Headache(784.0) 10/17/2012   Coronary artery disease    Hypertension    Hyperlipemia    Arthritis    CHF (congestive heart failure) ([place])    Vertigo    TIA (transient ischemic attack)    Pacemaker    MI (myocardial infarction) ([place])    Morbid obesity ([place])     Past Surgical History:  Procedure Laterality Date   ABDOMINAL  HYSTERECTOMY     CARDIAC CATHETERIZATION     CATARACT EXTRACTION W/PHACO Left 08/29/2013   Procedure: CATARACT EXTRACTION PHACO AND INTRAOCULAR LENS PLACEMENT ([place]);  Surgeon: Adonis Brook, MD;  Location: [facility];  Service: Ophthalmology;  Laterality: Left;   COLONOSCOPY WITH PROPOFOL N/A 01/07/2014   Procedure: COLONOSCOPY WITH PROPOFOL;  Surgeon: Juanita Craver, MD;  Location: [facility];  Service: Endoscopy;  Laterality: N/A;   CORONARY ANGIOGRAPHY N/A 05/27/2020   Procedure: CORONARY ANGIOGRAPHY;  Surgeon: Dixie Dials, MD;  Location: [facility] CV LAB;  Service: Cardiovascular;  Laterality: N/A;   DILATION AND CURETTAGE OF UTERUS     INSERT / REPLACE / REMOVE PACEMAKER     LEFT HEART CATH AND CORONARY ANGIOGRAPHY N/A 07/18/2020   Procedure: LEFT HEART CATH AND CORONARY ANGIOGRAPHY;  Surgeon: Dixie Dials, MD;  Location: [facility] CV LAB;  Service: Cardiovascular;  Laterality: N/A;   PACEMAKER GENERATOR CHANGE N/A 03/27/2014   Procedure: PACEMAKER GENERATOR CHANGE;  Surgeon: Sanda Klein, MD;  Location: [facility] C[facility];  Service: Cardiovascular;  Laterality: N/A;   PACEMAKER INSERTION     PACEMAKER INSERTION     TUBAL LIGATION     VASCULAR SURGERY     vericose vein surgery     OB History   No obstetric history on file.     No family history on file.  Social History   Tobacco Use   Smoking status: Never   Smokeless tobacco: Never  Vaping Use   Vaping Use: Never used  Substance Use Topics   Alcohol use: No   Drug use: No    Home Medications Prior to Admission medications   Medication Sig Start Date End Date Taking? Authorizing Provider  acetaminophen (TYLENOL) 500 MG tablet Take 1,000 mg by mouth every 6 (six) hours as needed for moderate pain. Patient not taking: No sig reported    [provider]  albuterol (VENTOLIN HFA) 108 (90 Base) MCG/ACT inhaler Inhale 2 puffs into the lungs every 6 (six) hours as needed for wheezing or shortness of breath.  10/12/19    [provider]  allopurinol (ZYLOPRIM) 100 MG tablet Take 1 tablet (100 mg total) by mouth daily. 07/23/21   Mercy Riding, MD  amiodarone (PACERONE) 200 MG tablet Take 200 mg by mouth daily.    [provider]  apixaban (ELIQUIS) 5 MG TABS tablet Take 5 mg by mouth 2 (two) times daily. 02/11/17   [provider]  aspirin EC 81 MG tablet Take 81 mg by mouth daily. Patient not taking: Reported on 07/16/2021    [provider]  diclofenac Sodium (VOLTAREN) 1 % GEL Apply 1 application topically 2 (two) times daily as needed for pain. 06/17/21   [provider]  famotidine (PEPCID) 40 MG tablet Take 40 mg by mouth daily as needed for heartburn or indigestion.  11/29/19   [provider]  fluticasone (FLONASE) 50 MCG/ACT nasal spray Place 2 sprays into both nostrils at bedtime as needed for allergies  or rhinitis.  07/08/19   [provider]  isosorbide mononitrate (IMDUR) 30 MG 24 hr tablet Take 1 tablet (30 mg total) by mouth daily. 07/23/21   Mercy Riding, MD  metoprolol succinate (TOPROL XL) 50 MG 24 hr tablet Take 1 tablet (50 mg total) by mouth daily. 07/23/21 01/19/22  Mercy Riding, MD  nitroGLYCERIN (NITROSTAT) 0.4 MG SL tablet Place 0.4 mg under the tongue every 5 (five) minutes as needed for chest pain.     [provider]  oxybutynin (DITROPAN) 5 MG tablet Take 5 mg by mouth 2 (two) times daily.    [provider]  oxyCODONE-acetaminophen (PERCOCET/ROXICET) 5-325 MG tablet Take 1 tablet by mouth every 8 (eight) hours as needed for moderate pain (pain). 11/20/19   [provider]  pantoprazole (PROTONIX) 40 MG tablet Take 40 mg by mouth 2 (two) times daily.     [provider]  polyethylene glycol (MIRALAX / GLYCOLAX) 17 g packet Take 17 g by mouth daily as needed for mild constipation.    [provider]  Potassium Chloride ER 20 MEQ TBCR Take 20 mEq by mouth 2 (two) times daily. 03/03/21    [provider]  rosuvastatin (CRESTOR) 10 MG tablet Take 10 mg by mouth daily.     [provider]  senna-docusate (SENOKOT-S) 8.6-50 MG tablet Take 1 tablet by mouth 2 (two) times daily as needed for moderate constipation. 07/23/21   Mercy Riding, MD  torsemide (DEMADEX) 20 MG tablet Take 2 tablets (40 mg total) by mouth daily. 07/24/21   Mercy Riding, MD    Allergies    Ampicillin, Penicillins, and Linzess [linaclotide]  Review of Systems   Review of Systems  Constitutional:  Positive for chills, fatigue and malaise/fatigue. Negative for diaphoresis and fever.  HENT:  Negative for congestion.   Eyes:  Negative for visual disturbance.  Respiratory:  Positive for cough, chest tightness and shortness of breath. Negative for wheezing.   Cardiovascular:  Positive for chest pain, palpitations and near-syncope. Negative for leg swelling.  Gastrointestinal:  Positive for constipation, diarrhea, nausea and vomiting. Negative for abdominal distention, abdominal pain and blood in stool.  Genitourinary:  Positive for frequency. Negative for dysuria, flank pain and urgency.  Musculoskeletal:  Negative for back pain, falls and neck pain.  Skin:  Negative for rash and wound.  Neurological:  Positive for weakness, light-headedness and headaches. Negative for dizziness, loss of consciousness, syncope, speech difficulty and numbness.  Psychiatric/Behavioral:  Negative for agitation and confusion.   All other systems reviewed and are negative.  Physical Exam Updated Vital Signs BP 137/84 (BP Location: Right Wrist)   Pulse (!) 59   Temp (!) 97.4 F (36.3 C)   Resp 16   SpO2 100%   Physical Exam Vitals and nursing note reviewed.  Constitutional:      General: She is not in acute distress.    Appearance: She is well-developed. She is not ill-appearing, toxic-appearing or diaphoretic.  HENT:     Head: Normocephalic and atraumatic.     Mouth/Throat:     Mouth: Mucous membranes  are moist.     Pharynx: No oropharyngeal exudate or posterior oropharyngeal erythema.  Eyes:     Extraocular Movements: Extraocular movements intact.     Conjunctiva/sclera: Conjunctivae normal.     Pupils: Pupils are equal, round, and reactive to light.  Cardiovascular:     Rate and Rhythm: Normal rate and regular rhythm.  Heart sounds: Murmur heard.  Pulmonary:     Effort: Pulmonary effort is normal. No respiratory distress.     Breath sounds: Rhonchi and rales present. No wheezing.  Chest:     Chest wall: No tenderness.  Abdominal:     General: Abdomen is flat. There is no distension.     Palpations: Abdomen is soft.     Tenderness: There is no abdominal tenderness. There is no right CVA tenderness, left CVA tenderness, guarding or rebound.  Musculoskeletal:        General: No tenderness.     Cervical back: Neck supple. No tenderness.     Right lower leg: Edema present.     Left lower leg: Edema present.  Skin:    General: Skin is warm and dry.     Capillary Refill: Capillary refill takes less than 2 seconds.     Findings: No erythema.  Neurological:     Mental Status: She is alert. Mental status is at baseline.     Sensory: No sensory deficit.     Motor: No weakness.  Psychiatric:        Mood and Affect: Mood normal.    ED Results / Procedures / Treatments   Labs (all labs ordered are listed, but only abnormal results are displayed) Labs Reviewed  CBC WITH DIFFERENTIAL/PLATELET - Abnormal; Notable for the following components:      Result Value   RDW 18.3 (*)    Platelets 118 (*)    Neutro Abs 1.6 (*)    All other components within normal limits  COMPREHENSIVE METABOLIC PANEL - Abnormal; Notable for the following components:   Glucose, Bld 109 (*)    Creatinine, Ser 1.67 (*)    GFR, Estimated 31 (*)    All other components within normal limits  RESP PANEL BY RT-PCR (FLU A&B, COVID) ARPGX2  LIPASE, BLOOD  URINALYSIS, ROUTINE W REFLEX MICROSCOPIC  TROPONIN I  (HIGH SENSITIVITY)  TROPONIN I (HIGH SENSITIVITY)    EKG EKG Interpretation  Date/Time:  Monday August 17 2021 12:15:19 EDT Ventricular Rate:  73 PR Interval:  268 QRS Duration: 206 QT Interval:  490 QTC Calculation: 539 R Axis:   -65 Text Interpretation: Sinus rhythm with 1st degree A-V block Left axis deviation Non-specific intra-ventricular conduction block Minimal voltage criteria for LVH, may be normal variant ( Cornell product ) Possible Lateral infarct , age undetermined Abnormal ECG When compared to prior, similar appearance. No STEMI Confirmed by Antony Blackbird [phone]) on 08/17/2021 5:36:49 PM  Radiology DG Chest 2 View  Result Date: 08/17/2021 CLINICAL DATA:  Chest pain EXAM: CHEST - 2 VIEW COMPARISON:  Radiograph 07/19/2021 FINDINGS: Unchanged, enlarged cardiac silhouette dual chamber pacemaker leads. Mild central pulmonary vascular congestion. No large pleural effusion or visible pneumothorax. There is bilateral shoulder degenerative change. No acute osseous abnormality. Thoracic spondylosis. IMPRESSION: Cardiomegaly with mild central pulmonary vascular congestion. Electronically Signed   By: Maurine Simmering M.D.   On: 08/17/2021 13:11   CT HEAD WO CONTRAST (5MM)  Result Date: 08/17/2021 CLINICAL DATA:  CT Head Heart palpitations; Patient states she has a pacemaker and feels irregular heartbeat. Patient states having confusion, some dizziness and headache.Headache, new or worsening (Age >= 50y) EXAM: CT HEAD WITHOUT CONTRAST TECHNIQUE: Contiguous axial images were obtained from the base of the skull through the vertex without intravenous contrast. COMPARISON:  None. FINDINGS: Brain: No acute intracranial hemorrhage. No focal mass lesion. No CT evidence of acute infarction. No midline shift or mass effect.  No hydrocephalus. Basilar cisterns are patent. There are periventricular and subcortical white matter hypodensities. Generalized cortical atrophy. Vascular: No hyperdense vessel or  unexpected calcification. Skull: Normal. Negative for fracture or focal lesion. Sinuses/Orbits: Paranasal sinuses and mastoid air cells are clear. Orbits are clear. Other: None. IMPRESSION: 1. No acute intracranial findings. 2. Mild periventricular white matter microvascular disease. Electronically Signed   By: Suzy Bouchard M.D.   On: 08/17/2021 20:28    Procedures Procedures   Medications Ordered in ED Medications - No data to display  ED Course  I have reviewed the triage vital signs and the nursing notes.  Pertinent labs & imaging results that were available during my care of the patient were reviewed by me and considered in my medical decision making (see chart for details).    MDM Rules/Calculators/A&P                           [name] is a 80 y.o. female with a past medical history significant for hypertension, hyperlipidemia, CHF with pacemaker, paroxysmal atrial fibrillation on Eliquis therapy, prior stroke, GERD, TIAs, obesity, hypercholesterolemia, and CAD with recent admission for pneumonia and fluid overload who presents with worsening headaches, nausea, vomiting, painful palpitations, urinary frequency, fatigue, chills, productive cough, and near syncope.  According to patient, for the last week she has been having some more headaches, acute episodes of nausea and vomiting, and episodes of near syncope.  She reports it almost daily, she has episodes that hit her where she has fast and painful palpitations in her chest and gets lightheaded and near syncopal.  She reports has not actually passed out but then close.  She reports it can be while she is resting or when she is getting up to move around.  She reports to me no abdominal pain, but does report some intermittent constipation and diarrhea.  She reports urinary frequency which is new.  She is says that she has been taking more diuretics and her legs seem to be improving from an edema standpoint.  She reports  otherwise no chest pain except for when she is having the fast and irregular episodes of heart rates.  She reports no other neurologic changes but reports that headaches come and go as well.  No reported falls.  She does report she is had some chills and a cough that has returned with a yellow productive sputum.  On exam, lungs do have some crackles and coarseness.  Chest was nontender and she had no abdominal tenderness.  Legs are edematous but reportedly improved from prior.  Otherwise she had intact sensation and strength unchanged from her baseline.  She does have a systolic murmur.  Back and flanks nontender.  Exam otherwise unremarkable.  EKG did not show STEMI.  Given her recent admission for fluid overload and pneumonia, with her recurrent cough and chills I am concerned she could have this recurring.  We will get chest x-ray and labs.  We will get a COVID test.  For her headaches with the nausea and vomiting we will get a head CT given her blood thinner use and anticipate reassessment after work-up to determine disposition.  We will attempt to interrogate her pacemaker as she reports the chest discomfort is only when she is having the fast and regular palpitations and when she feels like she is going to pass out.  9:01 PM Pacemaker interrogation revealed no acute episodes reported that correlate to her symptoms  over the last few days.  10:56 PM Work-up continue to return.  Patient's troponin was negative x2.  BNP not elevated.  Her CBC showed some thrombocytopenia but otherwise no anemia or leukocytosis.  Metabolic panel similar to prior with slightly increased creatinine.  Other work-up thus far reassuring.  Given her urinary frequency, waiting on urinalysis still however due to the painful palpitations and recurrent near syncopal episodes and chest discomfort, I called cardiology given her cardiac history.  Cardiology felt that she would benefit from an admission to be monitored on  telemetry with medicine given the recurrent episodes to see if an episode can be captured.   12:19 AM Urinalysis returned without UTI.  Medicine team called for admission for the intermittent near syncope with chest pain and shortness of breath.   Final Clinical Impression(s) / ED Diagnoses Final diagnoses:  Heart palpitations  Near syncope  Chest pain, unspecified type        Clinical Impression: 1. Heart palpitations   2. Near syncope   3. Chest pain, unspecified type     Disposition: Admit  This note was prepared with assistance of Dragon voice recognition software. Occasional wrong-word or sound-a-like substitutions may have occurred due to the inherent limitations of voice recognition software.      Ted Leonhart, Gwenyth Allegra, MD 08/18/21 [phone]

## 2021-08-17 NOTE — ED Triage Notes (Signed)
Pt from home for eval of generalized abdominal pain, emesis, headache, and weakness x 2 days. Had palpitations with emesis this morning but took her normal home meds and palpitations resolved.

## 2021-08-18 ENCOUNTER — Encounter (HOSPITAL_COMMUNITY): Payer: Self-pay | Admitting: Family Medicine

## 2021-08-18 DIAGNOSIS — R002 Palpitations: Secondary | ICD-10-CM

## 2021-08-18 DIAGNOSIS — I480 Paroxysmal atrial fibrillation: Secondary | ICD-10-CM

## 2021-08-18 DIAGNOSIS — R0789 Other chest pain: Secondary | ICD-10-CM | POA: Diagnosis not present

## 2021-08-18 DIAGNOSIS — R9431 Abnormal electrocardiogram [ECG] [EKG]: Secondary | ICD-10-CM

## 2021-08-18 LAB — BASIC METABOLIC PANEL
Anion gap: 6 (ref 5–15)
BUN: 11 mg/dL (ref 8–23)
CO2: 27 mmol/L (ref 22–32)
Calcium: 8.8 mg/dL — ABNORMAL LOW (ref 8.9–10.3)
Chloride: 106 mmol/L (ref 98–111)
Creatinine, Ser: 1.32 mg/dL — ABNORMAL HIGH (ref 0.44–1.00)
GFR, Estimated: 42 mL/min — ABNORMAL LOW (ref >60)
Glucose, Bld: 99 mg/dL (ref 70–99)
Potassium: 3.2 mmol/L — ABNORMAL LOW (ref 3.5–5.1)
Sodium: 139 mmol/L (ref 135–145)

## 2021-08-18 LAB — URINALYSIS, ROUTINE W REFLEX MICROSCOPIC
Bacteria, UA: NONE SEEN
Bilirubin Urine: NEGATIVE
Glucose, UA: NEGATIVE mg/dL
Hgb urine dipstick: NEGATIVE
Ketones, ur: NEGATIVE mg/dL
Leukocytes,Ua: NEGATIVE
Nitrite: NEGATIVE
Protein, ur: NEGATIVE mg/dL
Specific Gravity, Urine: 1.01 (ref 1.005–1.030)
pH: 8 (ref 5.0–8.0)

## 2021-08-18 LAB — URINE CULTURE: Culture: NO GROWTH

## 2021-08-18 LAB — TROPONIN I (HIGH SENSITIVITY): Troponin I (High Sensitivity): 14 ng/L (ref <18)

## 2021-08-18 LAB — LACTIC ACID, PLASMA: Lactic Acid, Venous: 1.5 mmol/L (ref 0.5–1.9)

## 2021-08-18 MED ORDER — METOPROLOL SUCCINATE ER 25 MG PO TB24
50.0000 mg | ORAL_TABLET | Freq: Every day | ORAL | Status: DC
  Administered 2021-08-18: 50 mg via ORAL
  Filled 2021-08-18: qty 2

## 2021-08-18 MED ORDER — APIXABAN 5 MG PO TABS
5.0000 mg | ORAL_TABLET | Freq: Two times a day (BID) | ORAL | Status: DC
  Administered 2021-08-18: 5 mg via ORAL
  Filled 2021-08-18: qty 1

## 2021-08-18 MED ORDER — AMIODARONE HCL 200 MG PO TABS
200.0000 mg | ORAL_TABLET | Freq: Every day | ORAL | Status: DC
  Administered 2021-08-18: 200 mg via ORAL
  Filled 2021-08-18: qty 1

## 2021-08-18 MED ORDER — POTASSIUM CHLORIDE CRYS ER 20 MEQ PO TBCR
40.0000 meq | EXTENDED_RELEASE_TABLET | Freq: Once | ORAL | Status: AC
  Administered 2021-08-18: 40 meq via ORAL
  Filled 2021-08-18: qty 2

## 2021-08-18 MED ORDER — NITROGLYCERIN 0.4 MG SL SUBL
0.4000 mg | SUBLINGUAL_TABLET | SUBLINGUAL | Status: DC | PRN
  Administered 2021-08-18 (×2): 0.4 mg via SUBLINGUAL
  Filled 2021-08-18: qty 1

## 2021-08-18 MED ORDER — ALBUTEROL SULFATE (2.5 MG/3ML) 0.083% IN NEBU: 3.0000 mL | INHALATION_SOLUTION | RESPIRATORY_TRACT | Status: DC | PRN

## 2021-08-18 MED ORDER — SODIUM CHLORIDE 0.9 % IV SOLN: 250.0000 mL | INTRAVENOUS | Status: DC | PRN

## 2021-08-18 MED ORDER — TORSEMIDE 20 MG PO TABS
40.0000 mg | ORAL_TABLET | Freq: Every day | ORAL | Status: DC
  Administered 2021-08-18: 40 mg via ORAL
  Filled 2021-08-18: qty 2

## 2021-08-18 MED ORDER — SODIUM CHLORIDE 0.9% FLUSH
3.0000 mL | Freq: Two times a day (BID) | INTRAVENOUS | Status: DC
  Administered 2021-08-18: 3 mL via INTRAVENOUS

## 2021-08-18 MED ORDER — ACETAMINOPHEN 325 MG PO TABS: 650.0000 mg | ORAL_TABLET | Freq: Four times a day (QID) | ORAL | Status: DC | PRN

## 2021-08-18 MED ORDER — ISOSORBIDE MONONITRATE ER 30 MG PO TB24
30.0000 mg | ORAL_TABLET | Freq: Every day | ORAL | Status: DC
  Administered 2021-08-18: 30 mg via ORAL
  Filled 2021-08-18: qty 1

## 2021-08-18 MED ORDER — ROSUVASTATIN CALCIUM 5 MG PO TABS
10.0000 mg | ORAL_TABLET | Freq: Every day | ORAL | Status: DC
  Administered 2021-08-18: 10 mg via ORAL
  Filled 2021-08-18: qty 2

## 2021-08-18 MED ORDER — SODIUM CHLORIDE 0.9% FLUSH: 3.0000 mL | INTRAVENOUS | Status: DC | PRN

## 2021-08-18 MED ORDER — OXYBUTYNIN CHLORIDE 5 MG PO TABS
5.0000 mg | ORAL_TABLET | Freq: Two times a day (BID) | ORAL | Status: DC
  Administered 2021-08-18: 5 mg via ORAL
  Filled 2021-08-18: qty 1

## 2021-08-18 MED ORDER — ACETAMINOPHEN 650 MG RE SUPP: 650.0000 mg | Freq: Four times a day (QID) | RECTAL | Status: DC | PRN

## 2021-08-18 MED ORDER — SENNOSIDES-DOCUSATE SODIUM 8.6-50 MG PO TABS: 1.0000 | ORAL_TABLET | Freq: Every evening | ORAL | Status: DC | PRN

## 2021-08-18 NOTE — H&P (Signed)
History and Physical    [name] ZOX:[id number] DOB: 07/02/1941 DOA: 08/17/2021  PCP: Rocky Morel, MD   Patient coming from: Home  Chief Complaint: chest pressure, dizziness, weakness  HPI: [name] is a 80 y.o. female with medical history significant for CAD, HTN, CHF, HLD, PAF, has pacemaker, PVD, morbid obesity who scented with a 1 week history of intermittent palpitations and chest pressure.  She reports that he gets chest pressure it is in the lower central substernal region and does not radiate.  States he has not had any nausea or vomiting with it.  She did have diarrhea a few days ago that was not severe and is resolved.  He denies having any fever, chills, cough.  Had intermittent headaches over the last week that resolve with Tylenol.  States she has not had any recent illness and has not been around anyone that she knows is sick.  She was admitted a few weeks ago for CHF exacerbation and states that her breathing is better than it was then.  Symptoms are nonspecific and work-up so far in the emergency room has been negative.  ED Course: She has had no arrhythmia noted on telemetry monitoring or on interrogation of her pacemaker.  Labs have been unremarkable.  Hospital service was asked to observe patient's on telemetry overnight and if has significant arrhythmias cardiology will consult.  Case was discussed with cardiology by the ER physician  Review of Systems:  General: Denies fever, chills, weight loss, night sweats. Denies change in appetite HENT: Denies head trauma, headache, denies change in hearing, tinnitus.  Denies nasal bleeding.  Denies sore throat.  Denies difficulty swallowing Eyes: Denies blurry vision, pain in eye, drainage.  Denies discoloration of eyes. Neck: Denies pain.  Denies swelling.  Denies pain with movement. Cardiovascular:Reports chest pressure and palpitations. Reports chronic edema.  Denies orthopnea Respiratory:  Denies shortness of breath, cough.  Denies wheezing.  Denies sputum production Gastrointestinal: Denies abdominal pain, swelling.  Denies nausea, vomiting, diarrhea.  Denies melena.  Denies hematemesis. Musculoskeletal: Denies limitation of movement.  Denies deformity or swelling. Denies arthralgias or myalgias. Genitourinary: Denies pelvic pain.  Denies urinary frequency or hesitancy.  Denies dysuria.  Skin: Denies rash.  Denies petechiae, purpura, ecchymosis. Neurological: Denies syncope. Denies seizure activity. Denies paresthesia. Denies slurred speech, drooping face.  Denies visual change. Psychiatric: Denies depression, anxiety. Denies hallucinations.  Past Medical History:  Diagnosis Date   Arthritis    CHF (congestive heart failure) ([place])    05/2011 echo severe concentric LVH, normal systolic fxn, grade 1 diastolic dysfxn, paradoxical septal motion   Coronary artery disease    Cath 04/2006 normal coronaries, mild-mod slow flow in all coronaries with slow distall runoff, mild-mod LV enlargement, EF 55%    Depression    Dyspnea    Dysrhythmia    GERD (gastroesophageal reflux disease)    Hypercholesterolemia    Hyperlipemia    Hypertension    MI (myocardial infarction) ([place])    NOT SURE HOW MANY   Morbid obesity ([place])    Nocturia    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   Peripheral vascular disease ([place])    Pneumonia NONE RECENT   Sleep apnea    needs cpap but does not wear   Stroke [facility])    TIA (transient ischemic attack)    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    Vertigo    takes meclizine daily    Past  Surgical History:  Procedure Laterality Date   ABDOMINAL HYSTERECTOMY     CARDIAC CATHETERIZATION     CATARACT EXTRACTION W/PHACO Left 08/29/2013   Procedure: CATARACT EXTRACTION PHACO AND INTRAOCULAR LENS PLACEMENT ([place]);  Surgeon: Adonis Brook, MD;  Location: [place];  Service: Ophthalmology;  Laterality: Left;   COLONOSCOPY WITH PROPOFOL N/A  01/07/2014   Procedure: COLONOSCOPY WITH PROPOFOL;  Surgeon: Juanita Craver, MD;  Location: [facility];  Service: Endoscopy;  Laterality: N/A;   CORONARY ANGIOGRAPHY N/A 05/27/2020   Procedure: CORONARY ANGIOGRAPHY;  Surgeon: Dixie Dials, MD;  Location: [place] CV LAB;  Service: Cardiovascular;  Laterality: N/A;   DILATION AND CURETTAGE OF UTERUS     INSERT / REPLACE / REMOVE PACEMAKER     LEFT HEART CATH AND CORONARY ANGIOGRAPHY N/A 07/18/2020   Procedure: LEFT HEART CATH AND CORONARY ANGIOGRAPHY;  Surgeon: Dixie Dials, MD;  Location: [place] CV LAB;  Service: Cardiovascular;  Laterality: N/A;   PACEMAKER GENERATOR CHANGE N/A 03/27/2014   Procedure: PACEMAKER GENERATOR CHANGE;  Surgeon: Sanda Klein, MD;  Location: [place] CATH LAB;  Service: Cardiovascular;  Laterality: N/A;   PACEMAKER INSERTION     PACEMAKER INSERTION     TUBAL LIGATION     VASCULAR SURGERY     vericose vein surgery    Social History  reports that she has never smoked. She has never used smokeless tobacco. She reports that she does not drink alcohol and does not use drugs.  Allergies  Allergen Reactions   Ampicillin Other (See Comments)    Increases heart rhythm  Has tolerated ceftriaxone in the past - 06/2020   Penicillins Other (See Comments)    Increases heart rhythm    Linzess [Linaclotide] Other (See Comments)    Severe stomach pains    History reviewed. No pertinent family history.   Prior to Admission medications   Medication Sig Start Date End Date Taking? Authorizing Provider  acetaminophen (TYLENOL) 500 MG tablet Take 1,000 mg by mouth every 6 (six) hours as needed for moderate pain. Patient not taking: No sig reported    [provider]  albuterol (VENTOLIN HFA) 108 (90 Base) MCG/ACT inhaler Inhale 2 puffs into the lungs every 6 (six) hours as needed for wheezing or shortness of breath.  10/12/19   [provider]  allopurinol (ZYLOPRIM) 100 MG tablet Take 1 tablet (100 mg  total) by mouth daily. 07/23/21   Mercy Riding, MD  amiodarone (PACERONE) 200 MG tablet Take 200 mg by mouth daily.    [provider]  apixaban (ELIQUIS) 5 MG TABS tablet Take 5 mg by mouth 2 (two) times daily. 02/11/17   [provider]  aspirin EC 81 MG tablet Take 81 mg by mouth daily. Patient not taking: Reported on 07/16/2021    [provider]  diclofenac Sodium (VOLTAREN) 1 % GEL Apply 1 application topically 2 (two) times daily as needed for pain. 06/17/21   [provider]  famotidine (PEPCID) 40 MG tablet Take 40 mg by mouth daily as needed for heartburn or indigestion.  11/29/19   [provider]  fluticasone (FLONASE) 50 MCG/ACT nasal spray Place 2 sprays into both nostrils at bedtime as needed for allergies or rhinitis.  07/08/19   [provider]  isosorbide mononitrate (IMDUR) 30 MG 24 hr tablet Take 1 tablet (30 mg total) by mouth daily. 07/23/21   Mercy Riding, MD  metoprolol succinate (TOPROL XL) 50 MG 24 hr tablet Take 1 tablet (50  mg total) by mouth daily. 07/23/21 01/19/22  Mercy Riding, MD  nitroGLYCERIN (NITROSTAT) 0.4 MG SL tablet Place 0.4 mg under the tongue every 5 (five) minutes as needed for chest pain.     [provider]  oxybutynin (DITROPAN) 5 MG tablet Take 5 mg by mouth 2 (two) times daily.    [provider]  oxyCODONE-acetaminophen (PERCOCET/ROXICET) 5-325 MG tablet Take 1 tablet by mouth every 8 (eight) hours as needed for moderate pain (pain). 11/20/19   [provider]  pantoprazole (PROTONIX) 40 MG tablet Take 40 mg by mouth 2 (two) times daily.     [provider]  polyethylene glycol (MIRALAX / GLYCOLAX) 17 g packet Take 17 g by mouth daily as needed for mild constipation.    [provider]  Potassium Chloride ER 20 MEQ TBCR Take 20 mEq by mouth 2 (two) times daily. 03/03/21   [provider]  rosuvastatin (CRESTOR) 10 MG tablet Take 10 mg by mouth daily.      [provider]  senna-docusate (SENOKOT-S) 8.6-50 MG tablet Take 1 tablet by mouth 2 (two) times daily as needed for moderate constipation. 07/23/21   Mercy Riding, MD  torsemide (DEMADEX) 20 MG tablet Take 2 tablets (40 mg total) by mouth daily. 07/24/21   Mercy Riding, MD    Physical Exam: Vitals:   08/17/21 2230 08/17/21 2300 08/17/21 2330 08/18/21 0000  BP: (!) 106/29 120/72 (!) 146/68 (!) 144/68  Pulse: 60   60  Resp: 18   18  Temp:      SpO2: 100%   100%    Constitutional: NAD, calm, comfortable Vitals:   08/17/21 2230 08/17/21 2300 08/17/21 2330 08/18/21 0000  BP: (!) 106/29 120/72 (!) 146/68 (!) 144/68  Pulse: 60   60  Resp: 18   18  Temp:      SpO2: 100%   100%   General: WDWN, Alert and oriented x3.  Eyes: EOMI, PERRL, conjunctivae normal.  Sclera nonicteric HENT:  [place]/AT, external ears normal.  Nares patent without epistasis.  Mucous membranes are moist. Posterior pharynx clear Neck: Soft, normal range of motion, supple, no masses, no thyromegaly.  Trachea midline Respiratory: clear to auscultation bilaterally, no wheezing, no crackles. Normal respiratory effort. No accessory muscle use.  Cardiovascular: Regular rate and rhythm, no murmurs / rubs / gallops. No extremity edema. 2+ pedal pulses.   Abdomen: Soft, no tenderness, nondistended, no rebound. No masses palpated. Morbid Obesity. Bowel sounds normoactive Musculoskeletal: FROM. no cyanosis. Normal muscle tone.  Skin: Warm, dry, intact no rashes, lesions, ulcers. No induration Neurologic: CN 2-12 grossly intact.  Normal speech.  Sensation intact. Strength 5/5 in all extremities.   Psychiatric: Normal judgment and insight.  Normal mood.    Labs on Admission: I have personally reviewed following labs and imaging studies  CBC: Recent Labs  Lab 08/17/21 1222  WBC 4.8  NEUTROABS 1.6*  HGB 12.9  HCT 40.6  MCV 99.0  PLT 118*    Basic Metabolic Panel: Recent Labs  Lab 08/17/21 1222  NA 140  K  3.5  CL 102  CO2 24  GLUCOSE 109*  BUN 14  CREATININE 1.67*  CALCIUM 8.9    GFR: CrCl cannot be calculated (Unknown ideal weight.).  Liver Function Tests: Recent Labs  Lab 08/17/21 1222  AST 21  ALT 9  ALKPHOS 84  BILITOT 0.8  PROT 7.7  ALBUMIN 3.6    Urine analysis:    Component Value  Date/Time   COLORURINE YELLOW 08/17/2021 [place] 08/17/2021 2357   LABSPEC 1.010 08/17/2021 2357   PHURINE 8.0 08/17/2021 2357   [place] 08/17/2021 2357   [place] 08/17/2021 [place] 08/17/2021 [place] 08/17/2021 2357   PROTEINUR NEGATIVE 08/17/2021 2357   UROBILINOGEN 0.2 02/07/2013 0203   NITRITE NEGATIVE 08/17/2021 2357   LEUKOCYTESUR NEGATIVE 08/17/2021 2357    Radiological Exams on Admission: DG Chest 2 View  Result Date: 08/17/2021 CLINICAL DATA:  Chest pain EXAM: CHEST - 2 VIEW COMPARISON:  Radiograph 07/19/2021 FINDINGS: Unchanged, enlarged cardiac silhouette dual chamber pacemaker leads. Mild central pulmonary vascular congestion. No large pleural effusion or visible pneumothorax. There is bilateral shoulder degenerative change. No acute osseous abnormality. Thoracic spondylosis. IMPRESSION: Cardiomegaly with mild central pulmonary vascular congestion. Electronically Signed   By: Maurine Simmering M.D.   On: 08/17/2021 13:11   CT HEAD WO CONTRAST (5MM)  Result Date: 08/17/2021 CLINICAL DATA:  CT Head Heart palpitations; Patient states she has a pacemaker and feels irregular heartbeat. Patient states having confusion, some dizziness and headache.Headache, new or worsening (Age >= 50y) EXAM: CT HEAD WITHOUT CONTRAST TECHNIQUE: Contiguous axial images were obtained from the base of the skull through the vertex without intravenous contrast. COMPARISON:  None. FINDINGS: Brain: No acute intracranial hemorrhage. No focal mass lesion. No CT evidence of acute infarction. No midline shift or mass effect. No hydrocephalus.  Basilar cisterns are patent. There are periventricular and subcortical white matter hypodensities. Generalized cortical atrophy. Vascular: No hyperdense vessel or unexpected calcification. Skull: Normal. Negative for fracture or focal lesion. Sinuses/Orbits: Paranasal sinuses and mastoid air cells are clear. Orbits are clear. Other: None. IMPRESSION: 1. No acute intracranial findings. 2. Mild periventricular white matter microvascular disease. Electronically Signed   By: Suzy Bouchard M.D.   On: 08/17/2021 20:28    EKG: Independently reviewed.  EKG shows normal sinus rhythm with first-degree AV block.  Nonspecific lateral ST changes but no acute ST elevation or depression.  QTc prolonged at 539  Assessment/Plan Principal Problem:   Chest pressure Ms. Loden placed on medical telemetry for observation. She has had negative workup in the ER and pacemaker was interrogated and had no arrhythmia noted.  ER physician discussed with cardiology with patient having chest pressure with feeling weak at the time the symptoms occur with negative work-up the recommended patient be monitored overnight No arrhythmias have been captured on telemetry for the last 12 to 13 hours in the emergency room. If continues to be stable patient will need outpatient work-up with Holter monitor and cardiology follow-up She did not have any near-syncope or syncopal events with her symptoms.  Active Problems:   Coronary artery disease Stable.  Continue home medications    Hypertension Continue home medications.  Monitor blood pressure    Paroxysmal atrial fibrillation  Patient has a pacemaker.  Is on Eliquis for anticoagulation    Morbid obesity Chronic.  CP for dietary, lifestyle, pharmacotherapy interventions as indicated for weight loss   Palpitations Reports intermittent palpitations.  Pacemaker was interrogated earlier in the emergency room with no signs of arrhythmia.  No chest pain at this time.  She did have  a pressure yesterday morning when she woke up.  Troponin and work-up has been negative    Shortness of breath Provide supplemental oxygen and albuterol MDI with spacer as needed    Prolonged QT interval Avoid medications which could further prolong QT interval  CKD 3 B Stable.     DVT prophylaxis: Is anticoagulated on eliquis  Code Status:   Full Code  Family Communication:  Diagnosis and plan discussed with patient.  Questions answered.  Verbalized understanding agrees with plan.  Further recommendations to follow as clinical indicated Disposition Plan:   Patient is from:  Home  Anticipated DC to:  Home  Anticipated DC date:  Anticipate less than 2 midnight stay  Admission status:  Observation   Yevonne Aline Maricus Tanzi MD [facility]  How to contact the [facility] Attending or Consulting provider [place] or covering provider during after hours [place], for this patient?   Check the care team in [facility] and look for a) attending/consulting [facility] provider listed and b) the [facility] team listed Log into [email or web address] and use [place]'s universal password to access. If you do not have the password, please contact the hospital operator. Locate the [place] provider you are looking for under [facility] and page to a number that you can be directly reached. If you still have difficulty reaching the provider, please page the [facility] (Director on Call) for the Hospitalists listed on amion for assistance.  08/18/2021, 1:20 AM

## 2021-08-18 NOTE — ED Notes (Signed)
Nitro x 2 SL given with relief. Now, mid chest pain is at 3/10 scale. Alert and oriented x 4. Unable to give the 3rd dose of Nitro as pt bp is 91/52 now. Will continue to monitor. MD updated with events and states he will talk to cardiologist.

## 2021-08-18 NOTE — ED Notes (Signed)
Daughter Lyndon Code [phone] would like an update

## 2021-08-18 NOTE — Discharge Instructions (Signed)
Follow with Primary MD Rocky Morel, MD in 7 days   Get CBC, CMP, 2 view Chest X ray checked  by Primary MD next visit.    Activity: As tolerated with Full fall precautions use walker/cane & assistance as needed   Disposition Home    Diet: Heart Healthy .  For Heart failure patients - Check your Weight same time everyday, if you gain over 2 pounds, or you develop in leg swelling, experience more shortness of breath or chest pain, call your Primary MD immediately. Follow Cardiac Low Salt Diet and 1.5 lit/day fluid restriction.   On your next visit with your primary care physician please Get Medicines reviewed and adjusted.   Please request your Prim.MD to go over all Hospital Tests and Procedure/Radiological results at the follow up, please get all Hospital records sent to your Prim MD by signing hospital release before you go home.   If you experience worsening of your admission symptoms, develop shortness of breath, life threatening emergency, suicidal or homicidal thoughts you must seek medical attention immediately by calling 911 or calling your MD immediately  if symptoms less severe.  You Must read complete instructions/literature along with all the possible adverse reactions/side effects for all the Medicines you take and that have been prescribed to you. Take any new Medicines after you have completely understood and accpet all the possible adverse reactions/side effects.   Do not drive, operating heavy machinery, perform activities at heights, swimming or participation in water activities or provide baby sitting services if your were admitted for syncope or siezures until you have seen by Primary MD or a Neurologist and advised to do so again.  Do not drive when taking Pain medications.    Do not take more than prescribed Pain, Sleep and Anxiety Medications  Special Instructions: If you have smoked or chewed Tobacco  in the last 2 yrs please stop smoking, stop  any regular Alcohol  and or any Recreational drug use.  Wear Seat belts while driving.   Please note  You were cared for by a hospitalist during your hospital stay. If you have any questions about your discharge medications or the care you received while you were in the hospital after you are discharged, you can call the unit and asked to speak with the hospitalist on call if the hospitalist that took care of you is not available. Once you are discharged, your primary care physician will handle any further medical issues. Please note that NO REFILLS for any discharge medications will be authorized once you are discharged, as it is imperative that you return to your primary care physician (or establish a relationship with a primary care physician if you do not have one) for your aftercare needs so that they can reassess your need for medications and monitor your lab values.

## 2021-08-18 NOTE — Consult Note (Signed)
Referring Physician:   TYQUASIA PANT is an 80 y.o. female.                       Chief Complaint: Chest pain  HPI: 80 years old black female with PMH of CAD, CKD II, HTN, HLD, s/p Pacemaker, prior stroke and morbid obesity has recurrent chest pain. Her troponin I levels are normal. Chest x-ray showed mild vascular congestion. Echocardiogram last month showed severe LVH. Cardiac cath 1 year ago showed mild CAD.   Past Medical History:  Diagnosis Date   Arthritis    CHF (congestive heart failure) ([place])    05/2011 echo severe concentric LVH, normal systolic fxn, grade 1 diastolic dysfxn, paradoxical septal motion   Coronary artery disease    Cath 04/2006 normal coronaries, mild-mod slow flow in all coronaries with slow distall runoff, mild-mod LV enlargement, EF 55%    Depression    Dyspnea    Dysrhythmia    GERD (gastroesophageal reflux disease)    Hypercholesterolemia    Hyperlipemia    Hypertension    MI (myocardial infarction) ([place])    NOT SURE HOW MANY   Morbid obesity ([place])    Nocturia    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   Peripheral vascular disease ([place])    Pneumonia NONE RECENT   Sleep apnea    needs cpap but does not wear   Stroke [facility])    TIA (transient ischemic attack)    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    Vertigo    takes meclizine daily      Past Surgical History:  Procedure Laterality Date   ABDOMINAL HYSTERECTOMY     CARDIAC CATHETERIZATION     CATARACT EXTRACTION W/PHACO Left 08/29/2013   Procedure: CATARACT EXTRACTION PHACO AND INTRAOCULAR LENS PLACEMENT ([place]);  Surgeon: Adonis Brook, MD;  Location: [facility];  Service: Ophthalmology;  Laterality: Left;   COLONOSCOPY WITH PROPOFOL N/A 01/07/2014   Procedure: COLONOSCOPY WITH PROPOFOL;  Surgeon: Juanita Craver, MD;  Location: [facility];  Service: Endoscopy;  Laterality: N/A;   CORONARY ANGIOGRAPHY N/A 05/27/2020   Procedure: CORONARY ANGIOGRAPHY;  Surgeon: Dixie Dials, MD;   Location: [facility] CV LAB;  Service: Cardiovascular;  Laterality: N/A;   DILATION AND CURETTAGE OF UTERUS     INSERT / REPLACE / REMOVE PACEMAKER     LEFT HEART CATH AND CORONARY ANGIOGRAPHY N/A 07/18/2020   Procedure: LEFT HEART CATH AND CORONARY ANGIOGRAPHY;  Surgeon: Dixie Dials, MD;  Location: [place] [facility];  Service: Cardiovascular;  Laterality: N/A;   PACEMAKER GENERATOR CHANGE N/A 03/27/2014   Procedure: PACEMAKER GENERATOR CHANGE;  Surgeon: Sanda Klein, MD;  Location: [facility] CATH LAB;  Service: Cardiovascular;  Laterality: N/A;   PACEMAKER INSERTION     PACEMAKER INSERTION     TUBAL LIGATION     VASCULAR SURGERY     vericose vein surgery    History reviewed. No pertinent family history. Social History:  reports that she has never smoked. She has never used smokeless tobacco. She reports that she does not drink alcohol and does not use drugs.  Allergies:  Allergies  Allergen Reactions   Ampicillin Other (See Comments)    Increases heart rhythm  Has tolerated ceftriaxone in the past - 06/2020   Penicillins Other (See Comments)    Increases heart rhythm    Linzess [Linaclotide] Other (See Comments)    Severe stomach pains    (Not in a  hospital admission)   Results for orders placed or performed during the hospital encounter of 08/17/21 (from the past 48 hour(s))  Resp Panel by RT-PCR (Flu A&B, Covid) Nasopharyngeal Swab     Status: None   Collection Time: 08/17/21 12:21 PM   Specimen: Nasopharyngeal Swab; Nasopharyngeal(NP) swabs in vial transport medium  Result Value Ref Range   SARS Coronavirus 2 by RT PCR NEGATIVE NEGATIVE    Comment: (NOTE) SARS-CoV-2 target nucleic acids are NOT DETECTED.  The SARS-CoV-2 RNA is generally detectable in upper respiratory specimens during the acute phase of infection. The lowest concentration of SARS-CoV-2 viral copies this assay can detect is 138 copies/mL. A negative result does not preclude SARS-Cov-2 infection and should  not be used as the sole basis for treatment or other patient management decisions. A negative result may occur with  improper specimen collection/handling, submission of specimen other than nasopharyngeal swab, presence of viral mutation(s) within the areas targeted by this assay, and inadequate number of viral copies(<138 copies/mL). A negative result must be combined with clinical observations, patient history, and epidemiological information. The expected result is Negative.  Fact Sheet for Patients:  EntrepreneurPulse.com.au  Fact Sheet for Healthcare Providers:  IncredibleEmployment.be  This test is no t yet approved or cleared by the [place] FDA and  has been authorized for detection and/or diagnosis of SARS-CoV-2 by FDA under an Emergency Use Authorization (EUA). This EUA will remain  in effect (meaning this test can be used) for the duration of the COVID-19 declaration under Section 564(b)(1) of the Act, 21 U.S.C.section 360bbb-3(b)(1), unless the authorization is terminated  or revoked sooner.       Influenza A by PCR NEGATIVE NEGATIVE   Influenza B by PCR NEGATIVE NEGATIVE    Comment: (NOTE) The Xpert Xpress SARS-CoV-2/FLU/RSV plus assay is intended as an aid in the diagnosis of influenza from Nasopharyngeal swab specimens and should not be used as a sole basis for treatment. Nasal washings and aspirates are unacceptable for Xpert Xpress SARS-CoV-2/FLU/RSV testing.  Fact Sheet for Patients: EntrepreneurPulse.com.au  Fact Sheet for Healthcare Providers: IncredibleEmployment.be  This test is not yet approved or cleared by the [place] FDA and has been authorized for detection and/or diagnosis of SARS-CoV-2 by FDA under an Emergency Use Authorization (EUA). This EUA will remain in effect (meaning this test can be used) for the duration of the COVID-19 declaration under Section 564(b)(1)  of the Act, 21 U.S.C. section 360bbb-3(b)(1), unless the authorization is terminated or revoked.  Performed at [facility], [place] [address], [place], [place] [address]   CBC with Differential     Status: Abnormal   Collection Time: 08/17/21 12:22 PM  Result Value Ref Range   WBC 4.8 4.0 - 10.5 K/uL   RBC 4.10 3.87 - 5.11 MIL/uL   Hemoglobin 12.9 12.0 - 15.0 g/dL   HCT 40.6 36.0 - 46.0 %   MCV 99.0 80.0 - 100.0 fL   MCH 31.5 26.0 - 34.0 pg   MCHC 31.8 30.0 - 36.0 g/dL   RDW 18.3 (H) 11.5 - 15.5 %   Platelets 118 (L) 150 - 400 K/uL    Comment: REPEATED TO VERIFY PLATELET COUNT CONFIRMED BY SMEAR    nRBC 0.0 0.0 - 0.2 %   Neutrophils Relative % 34 %   Neutro Abs 1.6 (L) 1.7 - 7.7 K/uL   Lymphocytes Relative 50 %   Lymphs Abs 2.4 0.7 - 4.0 K/uL   Monocytes Relative 10 %  Monocytes Absolute 0.5 0.1 - 1.0 K/uL   Eosinophils Relative 5 %   Eosinophils Absolute 0.2 0.0 - 0.5 K/uL   Basophils Relative 1 %   Basophils Absolute 0.1 0.0 - 0.1 K/uL   Immature Granulocytes 0 %   Abs Immature Granulocytes 0.02 0.00 - 0.07 K/uL    Comment: Performed at [facility], [place] [address] [place], [place] [address]  Comprehensive metabolic panel     Status: Abnormal   Collection Time: 08/17/21 12:22 PM  Result Value Ref Range   Sodium 140 135 - 145 mmol/L   Potassium 3.5 3.5 - 5.1 mmol/L   Chloride 102 98 - 111 mmol/L   CO2 24 22 - 32 mmol/L   Glucose, Bld 109 (H) 70 - 99 mg/dL    Comment: Glucose reference range applies only to samples taken after fasting for at least 8 hours.   BUN 14 8 - 23 mg/dL   Creatinine, Ser 1.67 (H) 0.44 - 1.00 mg/dL   Calcium 8.9 8.9 - 10.3 mg/dL   Total Protein 7.7 6.5 - 8.1 g/dL   Albumin 3.6 3.5 - 5.0 g/dL   AST 21 15 - 41 U/L   ALT 9 0 - 44 U/L   Alkaline Phosphatase 84 38 - 126 U/L   Total Bilirubin 0.8 0.3 - 1.2 mg/dL   GFR, Estimated 31 (L) >60 mL/min    Comment: (NOTE) Calculated using the CKD-EPI Creatinine Equation (2021)    Anion  gap 14 5 - 15    Comment: Performed at [facility] [address] [place], [place] [address]  Troponin I (High Sensitivity)     Status: None   Collection Time: 08/17/21 12:22 PM  Result Value Ref Range   Troponin I (High Sensitivity) 7 <18 ng/L    Comment: (NOTE) Elevated high sensitivity troponin I (hsTnI) values and significant  changes across serial measurements may suggest ACS but many other  chronic and acute conditions are known to elevate hsTnI results.  Refer to the "Links" section for chest pain algorithms and additional  guidance. Performed at [facility], [place] [address] [place], [place] [address]   Lipase, blood     Status: None   Collection Time: 08/17/21 12:22 PM  Result Value Ref Range   Lipase 21 11 - 51 U/L    Comment: Performed at [facility], [place] [address], [place], [place] [address]  Brain natriuretic peptide     Status: None   Collection Time: 08/17/21  6:02 PM  Result Value Ref Range   B Natriuretic Peptide 59.0 0.0 - 100.0 pg/mL    Comment: Performed at [facility] [address] [place], [place] [address]  Lactic acid, plasma     Status: None   Collection Time: 08/17/21  6:02 PM  Result Value Ref Range   Lactic Acid, Venous 1.6 0.5 - 1.9 mmol/L    Comment: Performed at [facility] [address], [place], [place] [address]  TSH     Status: None   Collection Time: 08/17/21  6:04 PM  Result Value Ref Range   TSH 1.469 0.350 - 4.500 uIU/mL    Comment: Performed by a 3rd Generation assay with a functional sensitivity of <=0.01 uIU/mL. Performed at [facility], [place] [address] [place], [place] [address]   Troponin I (High Sensitivity)     Status: None   Collection Time: 08/17/21  6:38 PM  Result  Value Ref Range   Troponin I (High Sensitivity) 12 <18 ng/L    Comment: (NOTE) Elevated high sensitivity troponin I (hsTnI) values and significant  changes across serial measurements may suggest ACS but many other   chronic and acute conditions are known to elevate hsTnI results.  Refer to the "Links" section for chest pain algorithms and additional  guidance. Performed at [facility], [place] [address] [place], [place] [address]   Urinalysis, Routine w reflex microscopic     Status: None   Collection Time: 08/17/21 11:57 PM  Result Value Ref Range   Color, Urine YELLOW YELLOW   APPearance CLEAR CLEAR   Specific Gravity, Urine 1.010 1.005 - 1.030   pH 8.0 5.0 - 8.0   Glucose, UA NEGATIVE NEGATIVE mg/dL   Hgb urine dipstick NEGATIVE NEGATIVE   Bilirubin Urine NEGATIVE NEGATIVE   Ketones, ur NEGATIVE NEGATIVE mg/dL   Protein, ur NEGATIVE NEGATIVE mg/dL   Nitrite NEGATIVE NEGATIVE   Leukocytes,Ua NEGATIVE NEGATIVE   WBC, UA 0-5 0 - 5 WBC/hpf   Bacteria, UA NONE SEEN NONE SEEN   Squamous Epithelial / LPF 0-5 0 - 5    Comment: Performed at [facility], [place] [address], [place], [place] [address]  Lactic acid, plasma     Status: None   Collection Time: 08/18/21  5:00 AM  Result Value Ref Range   Lactic Acid, Venous 1.5 0.5 - 1.9 mmol/L    Comment: Performed at [facility] [address], [place], [place] [address]  Basic metabolic panel     Status: Abnormal   Collection Time: 08/18/21  5:00 AM  Result Value Ref Range   Sodium 139 135 - 145 mmol/L   Potassium 3.2 (L) 3.5 - 5.1 mmol/L   Chloride 106 98 - 111 mmol/L   CO2 27 22 - 32 mmol/L   Glucose, Bld 99 70 - 99 mg/dL    Comment: Glucose reference range applies only to samples taken after fasting for at least 8 hours.   BUN 11 8 - 23 mg/dL   Creatinine, Ser 1.32 (H) 0.44 - 1.00 mg/dL   Calcium 8.8 (L) 8.9 - 10.3 mg/dL   GFR, Estimated 42 (L) >60 mL/min    Comment: (NOTE) Calculated using the CKD-EPI Creatinine Equation (2021)    Anion gap 6 5 - 15    Comment: Performed at [facility] [address], [place], [place] [address]  Troponin I (High Sensitivity)     Status: None   Collection Time: 08/18/21 10:10  AM  Result Value Ref Range   Troponin I (High Sensitivity) 14 <18 ng/L    Comment: (NOTE) Elevated high sensitivity troponin I (hsTnI) values and significant  changes across serial measurements may suggest ACS but many other  chronic and acute conditions are known to elevate hsTnI results.  Refer to the "Links" section for chest pain algorithms and additional  guidance. Performed at [facility], [place] [address] [place], [place] [address]    DG Chest 2 View  Result Date: 08/17/2021 CLINICAL DATA:  Chest pain EXAM: CHEST - 2 VIEW COMPARISON:  Radiograph 07/19/2021 FINDINGS: Unchanged, enlarged cardiac silhouette dual chamber pacemaker leads. Mild central pulmonary vascular congestion. No large pleural effusion or visible pneumothorax. There is bilateral shoulder degenerative change. No acute osseous abnormality. Thoracic spondylosis. IMPRESSION: Cardiomegaly with mild central pulmonary vascular congestion. Electronically Signed   By: Maurine Simmering M.D.   On: 08/17/2021 13:11  CT HEAD WO CONTRAST (5MM)  Result Date: 08/17/2021 CLINICAL DATA:  CT Head Heart palpitations; Patient states she has a pacemaker and feels irregular heartbeat. Patient states having confusion, some dizziness and headache.Headache, new or worsening (Age >= 50y) EXAM: CT HEAD WITHOUT CONTRAST TECHNIQUE: Contiguous axial images were obtained from the base of the skull through the vertex without intravenous contrast. COMPARISON:  None. FINDINGS: Brain: No acute intracranial hemorrhage. No focal mass lesion. No CT evidence of acute infarction. No midline shift or mass effect. No hydrocephalus. Basilar cisterns are patent. There are periventricular and subcortical white matter hypodensities. Generalized cortical atrophy. Vascular: No hyperdense vessel or unexpected calcification. Skull: Normal. Negative for fracture or focal lesion. Sinuses/Orbits: Paranasal sinuses and mastoid air cells are clear. Orbits are clear. Other:  None. IMPRESSION: 1. No acute intracranial findings. 2. Mild periventricular white matter microvascular disease. Electronically Signed   By: Suzy Bouchard M.D.   On: 08/17/2021 20:28    Review Of Systems Constitutional: No fever, chills, Chronic weight gain. Eyes: No vision change, wears glasses. No discharge or pain. Ears: No hearing loss, No tinnitus. Respiratory: No asthma, COPD, pneumonias. Positive shortness of breath. No hemoptysis. Cardiovascular: Positive chest pain, palpitation, leg edema. Gastrointestinal: No nausea, vomiting, diarrhea, constipation. No GI bleed. No hepatitis. Genitourinary: No dysuria, hematuria, kidney stone. No incontinance. Neurological: Positive headache, stroke, no seizures.  Psychiatry: No psych facility admission for anxiety, depression, suicide. No detox. Skin: No rash. Musculoskeletal: No joint pain, fibromyalgia. No neck pain, back pain. Lymphadenopathy: No lymphadenopathy. Hematology: No anemia or easy bruising.   Blood pressure 140/74, pulse 69, temperature 98 F (36.7 C), temperature source Oral, resp. rate 18, SpO2 100 %. There is no height or weight on file to calculate BMI. General appearance: alert, cooperative, appears stated age and no distress Head: Normocephalic, atraumatic. Eyes: Brown eyes, pink conjunctiva, corneas clear.  Neck: No adenopathy, no carotid bruit, no JVD, supple, symmetrical, trachea midline and thyroid not enlarged. Resp: Fine basal crackles to auscultation bilaterally. Cardio: Regular rate and rhythm, S1, S2 normal, II/VI systolic murmur, no click, rub or gallop GI: Soft, non-tender; bowel sounds normal; no organomegaly. Extremities: 2 + edema, no cyanosis or clubbing. 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 Skin: Warm and dry.  Neurologic: Alert and oriented X 3, normal strength. Normal coordination and slow gait with walker use.  Assessment/Plan Chest pain CAD HTN Hypertrophic  non-obstructive cardiomyopathy Morbid obesity Shortness of breath Paroxysmal atrial fibrillation Palpitations S/P Pacemaker  Plan:  Continue medical treatment. F/U in 1 month or earlier as needed.  Time spent: Review of old records, Lab, x-rays, EKG, other cardiac tests, examination, discussion with patient over 70 minutes.  Birdie Riddle, MD  08/18/2021, 6:35 PM

## 2021-08-18 NOTE — ED Notes (Signed)
Pt's linens wet, pt changed, new linens, purewick in place, and pt now comfortable/resting.

## 2021-08-18 NOTE — Discharge Summary (Signed)
Physician Discharge Summary  ALMA MOHIUDDIN CWC:[id number] DOB: 12-28-1940 DOA: 08/17/2021  PCP: Rocky Morel, MD  Admit date: 08/17/2021 Discharge date: 08/18/2021  Admitted From: Home Disposition:  Home   Recommendations for Outpatient Follow-up:  Follow up with PCP in 1-2 weeks  Home Health:NO  Discharge Condition:Stable CODE STATUS:FULL Diet recommendation: Heart Healthy   Brief/Interim Summary:  [name] is a 80 y.o. female with medical history significant for CAD, HTN, CHF, HLD, PAF, has pacemaker, PVD, morbid obesity who scented with a 1 week history of intermittent palpitations and chest pressure.  She reports that he gets chest pressure it is in the lower central substernal region and does not radiate.  States he has not had any nausea or vomiting with it.  She did have diarrhea a few days ago that was not severe and is resolved.  He denies having any fever, chills, cough. in ED  She has had no arrhythmia noted on telemetry monitoring or on interrogation of her pacemaker.  Her EKG was significant for left bundle branch block which is old, troponins were negative x3, patient admitted kept for observation, no significant events on telemetry, she had negative troponins, she had couple episodes of chest pain, which where of musculoskeletal etiology, patient was seen by her cardiologist who evaluated her, no further work-up indicated currently, no change of her medications, and she will follow with him as an outpatient in 1 month.    Chest pressure -Has some nontypical features, reproducible by palpation, her EKG is nonacute, but it has left bundle branch block at baseline, she had cardiac cath last year, which was significant for nonobstructive CAD, troponins were negative, ACS ruled out, I have discussed with her cardiologist, who evaluated and assessed her in ED, no further work-up currently, and he will follow her as an outpatient.   Coronary  artery disease Stable.  Continue home medications    Hypertension Continue home medications.  Monitor blood pressure     Paroxysmal atrial fibrillation  Patient has a pacemaker.  Is on Eliquis for anticoagulation     Morbid obesity      CKD 3 B Stable.     Discharge Diagnoses:  Principal Problem:   Chest pressure Active Problems:   Coronary artery disease   Hypertension   Morbid obesity (HCC)   Shortness of breath   Paroxysmal atrial fibrillation (HCC)   Palpitations   Prolonged QT interval    Discharge Instructions  Discharge Instructions     Diet - low sodium heart healthy   Complete by: As directed    Discharge instructions   Complete by: As directed    Follow with Primary MD Rocky Morel, MD in 7 days   Get CBC, CMP, 2 view Chest X ray checked  by Primary MD next visit.    Activity: As tolerated with Full fall precautions use walker/cane & assistance as needed   Disposition Home    Diet: Heart Healthy .  For Heart failure patients - Check your Weight same time everyday, if you gain over 2 pounds, or you develop in leg swelling, experience more shortness of breath or chest pain, call your Primary MD immediately. Follow Cardiac Low Salt Diet and 1.5 lit/day fluid restriction.   On your next visit with your primary care physician please Get Medicines reviewed and adjusted.   Please request your Prim.MD to go over all Hospital Tests and Procedure/Radiological results at the follow up, please get all Hospital records sent to  your Prim MD by signing hospital release before you go home.   If you experience worsening of your admission symptoms, develop shortness of breath, life threatening emergency, suicidal or homicidal thoughts you must seek medical attention immediately by calling 911 or calling your MD immediately  if symptoms less severe.  You Must read complete instructions/literature along with all the possible adverse reactions/side  effects for all the Medicines you take and that have been prescribed to you. Take any new Medicines after you have completely understood and accpet all the possible adverse reactions/side effects.   Do not drive, operating heavy machinery, perform activities at heights, swimming or participation in water activities or provide baby sitting services if your were admitted for syncope or siezures until you have seen by Primary MD or a Neurologist and advised to do so again.  Do not drive when taking Pain medications.    Do not take more than prescribed Pain, Sleep and Anxiety Medications  Special Instructions: If you have smoked or chewed Tobacco  in the last 2 yrs please stop smoking, stop any regular Alcohol  and or any Recreational drug use.  Wear Seat belts while driving.   Please note  You were cared for by a hospitalist during your hospital stay. If you have any questions about your discharge medications or the care you received while you were in the hospital after you are discharged, you can call the unit and asked to speak with the hospitalist on call if the hospitalist that took care of you is not available. Once you are discharged, your primary care physician will handle any further medical issues. Please note that NO REFILLS for any discharge medications will be authorized once you are discharged, as it is imperative that you return to your primary care physician (or establish a relationship with a primary care physician if you do not have one) for your aftercare needs so that they can reassess your need for medications and monitor your lab values.   Increase activity slowly   Complete by: As directed       Allergies as of 08/18/2021       Reactions   Ampicillin Other (See Comments)   Increases heart rhythm Has tolerated ceftriaxone in the past - 06/2020   Penicillins Other (See Comments)   Increases heart rhythm   Linzess [linaclotide] Other (See Comments)   Severe stomach pains         Medication List     TAKE these medications    albuterol 108 (90 Base) MCG/ACT inhaler Commonly known as: VENTOLIN HFA Inhale 2 puffs into the lungs every 6 (six) hours as needed for wheezing or shortness of breath.   allopurinol 100 MG tablet Commonly known as: ZYLOPRIM Take 1 tablet (100 mg total) by mouth daily.   amiodarone 200 MG tablet Commonly known as: PACERONE Take 200 mg by mouth daily.   apixaban 5 MG Tabs tablet Commonly known as: ELIQUIS Take 5 mg by mouth 2 (two) times daily.   aspirin EC 81 MG tablet Take 81 mg by mouth daily.   diclofenac Sodium 1 % Gel Commonly known as: VOLTAREN Apply 1 application topically 2 (two) times daily as needed for pain.   famotidine 40 MG tablet Commonly known as: PEPCID Take 40 mg by mouth daily as needed for heartburn or indigestion.   fluticasone 50 MCG/ACT nasal spray Commonly known as: FLONASE Place 2 sprays into both nostrils at bedtime as needed for allergies or rhinitis.  isosorbide mononitrate 30 MG 24 hr tablet Commonly known as: IMDUR Take 1 tablet (30 mg total) by mouth daily.   metoprolol succinate 50 MG 24 hr tablet Commonly known as: Toprol XL Take 1 tablet (50 mg total) by mouth daily.   nitroGLYCERIN 0.4 MG SL tablet Commonly known as: NITROSTAT Place 0.4 mg under the tongue every 5 (five) minutes as needed for chest pain.   oxybutynin 5 MG tablet Commonly known as: DITROPAN Take 5 mg by mouth 2 (two) times daily.   oxyCODONE-acetaminophen 5-325 MG tablet Commonly known as: PERCOCET/ROXICET Take 1 tablet by mouth every 8 (eight) hours as needed for moderate pain (pain).   pantoprazole 40 MG tablet Commonly known as: PROTONIX Take 40 mg by mouth 2 (two) times daily.   polyethylene glycol 17 g packet Commonly known as: MIRALAX / GLYCOLAX Take 17 g by mouth daily as needed for mild constipation.   potassium chloride SA 20 MEQ tablet Commonly known as: KLOR-CON Take 20 mEq by mouth  2 (two) times daily.   rosuvastatin 10 MG tablet Commonly known as: CRESTOR Take 10 mg by mouth daily.   senna-docusate 8.6-50 MG tablet Commonly known as: Senokot-S Take 1 tablet by mouth 2 (two) times daily as needed for moderate constipation.   torsemide 20 MG tablet Commonly known as: Demadex Take 2 tablets (40 mg total) by mouth daily.   vitamin B-12 1000 MCG tablet Commonly known as: CYANOCOBALAMIN Take 1,000 mcg by mouth daily.        Follow-up Information     Sanchez-Brugal, Myer Peer, MD Follow up.   Specialty: Internal Medicine Contact information: [address] [place] [address] [phone]         Dixie Dials, MD Follow up in 1 month(s).   Specialty: Cardiology Contact information: [place] [address] [phone]                Allergies  Allergen Reactions   Ampicillin Other (See Comments)    Increases heart rhythm  Has tolerated ceftriaxone in the past - 06/2020   Penicillins Other (See Comments)    Increases heart rhythm    Linzess [Linaclotide] Other (See Comments)    Severe stomach pains    Consultations: cardiology   Procedures/Studies: DG Chest 2 View  Result Date: 08/17/2021 CLINICAL DATA:  Chest pain EXAM: CHEST - 2 VIEW COMPARISON:  Radiograph 07/19/2021 FINDINGS: Unchanged, enlarged cardiac silhouette dual chamber pacemaker leads. Mild central pulmonary vascular congestion. No large pleural effusion or visible pneumothorax. There is bilateral shoulder degenerative change. No acute osseous abnormality. Thoracic spondylosis. IMPRESSION: Cardiomegaly with mild central pulmonary vascular congestion. Electronically Signed   By: Maurine Simmering M.D.   On: 08/17/2021 13:11   CT HEAD WO CONTRAST (5MM)  Result Date: 08/17/2021 CLINICAL DATA:  CT Head Heart palpitations; Patient states she has a pacemaker and feels irregular heartbeat. Patient states having confusion, some dizziness and  headache.Headache, new or worsening (Age >= 50y) EXAM: CT HEAD WITHOUT CONTRAST TECHNIQUE: Contiguous axial images were obtained from the base of the skull through the vertex without intravenous contrast. COMPARISON:  None. FINDINGS: Brain: No acute intracranial hemorrhage. No focal mass lesion. No CT evidence of acute infarction. No midline shift or mass effect. No hydrocephalus. Basilar cisterns are patent. There are periventricular and subcortical white matter hypodensities. Generalized cortical atrophy. Vascular: No hyperdense vessel or unexpected calcification. Skull: Normal. Negative for fracture or focal lesion. Sinuses/Orbits: Paranasal sinuses and mastoid air cells  are clear. Orbits are clear. Other: None. IMPRESSION: 1. No acute intracranial findings. 2. Mild periventricular white matter microvascular disease. Electronically Signed   By: Suzy Bouchard M.D.   On: 08/17/2021 20:28   VAS [place] ABI WITH/WO TBI  Result Date: 07/21/2021  LOWER EXTREMITY DOPPLER STUDY Patient Name:  SIMONNE BOULOS  Date of Exam:   07/21/2021 Medical Rec #: [id number]              Accession #:    [id number] Date of Birth: [birth year]             Patient Gender: F Patient Age:   55 years Exam Location:  [facility] Procedure:      VAS [place] ABI WITH/WO TBI Referring Phys: TAYE GONFA --------------------------------------------------------------------------------  Indications: Rest pain, and ulceration. High Risk         Hypertension, hyperlipidemia, Diabetes, coronary artery Factors:          disease.  Comparison Study: no prior Performing Technologist: Archie Patten RVS  Examination Guidelines: A complete evaluation includes at minimum, Doppler waveform signals and systolic blood pressure reading at the level of bilateral brachial, anterior tibial, and posterior tibial arteries, when vessel segments are accessible. Bilateral testing is considered an integral part of a complete examination. Photoelectric  Plethysmograph (PPG) waveforms and toe systolic pressure readings are included as required and additional duplex testing as needed. Limited examinations for reoccurring indications may be performed as noted.  ABI Findings: +--------+------------------+-----+---------+--------+ Right   Rt Pressure (mmHg)IndexWaveform Comment  +--------+------------------+-----+---------+--------+ [id number]                    triphasic         +--------+------------------+-----+---------+--------+ PTA     153               1.14 triphasic         +--------+------------------+-----+---------+--------+ DP      139               1.04 triphasic         +--------+------------------+-----+---------+--------+ +--------+------------------+-----+---------+-------+ Left    Lt Pressure (mmHg)IndexWaveform Comment +--------+------------------+-----+---------+-------+ [id number]                    triphasic        +--------+------------------+-----+---------+-------+ PTA     173               1.29 triphasic        +--------+------------------+-----+---------+-------+ DP      162               1.21 triphasic        +--------+------------------+-----+---------+-------+ +-------+-----------+-----------+------------+------------+ ABI/TBIToday's ABIToday's TBIPrevious ABIPrevious TBI +-------+-----------+-----------+------------+------------+ Right  1.14                                           +-------+-----------+-----------+------------+------------+ Left   1.29                                           +-------+-----------+-----------+------------+------------+  Summary: Right: Resting right ankle-brachial index is within normal range. No evidence of significant right lower extremity arterial disease. Left: Resting left ankle-brachial index is within normal range. No evidence of significant left lower extremity arterial disease.  *See table(s) above for measurements  and  observations.  Electronically signed by Deitra Mayo MD on 07/21/2021 at 3:43:46 PM.    Final    ECHOCARDIOGRAM LIMITED  Result Date: 07/19/2021    ECHOCARDIOGRAM LIMITED REPORT   Patient Name:   MARYAN SIVAK Date of Exam: 07/19/2021 Medical Rec #:  [id number]             Height:       67.0 in Accession #:    [id number]            Weight:       368.6 lb Date of Birth:  [birth year]            BSA:          2.622 m Patient Age:    64 years              BP:           131/57 mmHg Patient Gender: F                     HR:           75 bpm. Exam Location:  Inpatient Procedure: Limited Echo, Cardiac Doppler and Color Doppler Indications:    R06.02 SOB  History:        Patient has prior history of Echocardiogram examinations, most                 recent 07/19/2020. CAD and Previous Myocardial Infarction,                 Abnormal ECG and Pacemaker, TIA, Arrythmias:AV block and Atrial                 Fibrillation, Signs/Symptoms:Dyspnea and Shortness of Breath;                 Risk Factors:Hypertension and Dyslipidemia.  Sonographer:    Roseanna Rainbow RDCS Referring Phys: [id number] [facility]  Sonographer Comments: Patient is morbidly obese. Image acquisition challenging due to patient body habitus. IMPRESSIONS  1. Abnormal septal motion from pacing . Left ventricular ejection fraction, by estimation, is 55 to 60%. The left ventricle has normal function. The left ventricle has no regional wall motion abnormalities. There is severe left ventricular hypertrophy. Left ventricular diastolic parameters are indeterminate.  2. Pacing wires in RA/RV . Right ventricular systolic function is normal. The right ventricular size is normal. There is mildly elevated pulmonary artery systolic pressure.  3. Left atrial size was moderately dilated.  4. The mitral valve is normal in structure. No evidence of mitral valve regurgitation. No evidence of mitral stenosis.  5. The aortic valve is normal in structure. There is mild  calcification of the aortic valve. Aortic valve regurgitation is not visualized. Mild aortic valve sclerosis is present, with no evidence of aortic valve stenosis.  6. Aortic dilatation noted. There is mild dilatation of the ascending aorta, measuring 40 mm.  7. The inferior vena cava is normal in size with greater than 50% respiratory variability, suggesting right atrial pressure of 3 mmHg. FINDINGS  Left Ventricle: Abnormal septal motion from pacing. Left ventricular ejection fraction, by estimation, is 55 to 60%. The left ventricle has normal function. The left ventricle has no regional wall motion abnormalities. The left ventricular internal cavity size was normal in size. There is severe left ventricular hypertrophy. Left ventricular diastolic parameters are indeterminate. Right Ventricle: Pacing wires in RA/RV. The right ventricular size is normal. No increase in right ventricular  wall thickness. Right ventricular systolic function is normal. There is mildly elevated pulmonary artery systolic pressure. The tricuspid regurgitant velocity is 2.84 m/s, and with an assumed right atrial pressure of 8 mmHg, the estimated right ventricular systolic pressure is 81.0 mmHg. Left Atrium: Left atrial size was moderately dilated. Right Atrium: Right atrial size was normal in size. Pericardium: There is no evidence of pericardial effusion. Mitral Valve: The mitral valve is normal in structure. No evidence of mitral valve stenosis. Tricuspid Valve: The tricuspid valve is normal in structure. Tricuspid valve regurgitation is not demonstrated. No evidence of tricuspid stenosis. Aortic Valve: The aortic valve is normal in structure. There is mild calcification of the aortic valve. Aortic valve regurgitation is not visualized. Mild aortic valve sclerosis is present, with no evidence of aortic valve stenosis. Pulmonic Valve: The pulmonic valve was normal in structure. Pulmonic valve regurgitation is not visualized. No evidence of  pulmonic stenosis. Aorta: Aortic dilatation noted. There is mild dilatation of the ascending aorta, measuring 40 mm. Venous: The inferior vena cava is normal in size with greater than 50% respiratory variability, suggesting right atrial pressure of 3 mmHg. IAS/Shunts: No atrial level shunt detected by color flow Doppler. LEFT VENTRICLE PLAX 2D LVIDd:         4.90 cm LVIDs:         3.30 cm LV PW:         1.60 cm LV IVS:        1.80 cm LVOT diam:     2.30 cm LVOT Area:     4.15 cm  LV Volumes (MOD) LV vol d, MOD A2C: 94.6 ml LV vol d, MOD A4C: 78.6 ml LV vol s, MOD A2C: 25.4 ml LV vol s, MOD A4C: 21.4 ml LV SV MOD A2C:     69.2 ml LV SV MOD A4C:     78.6 ml LV SV MOD BP:      63.4 ml IVC IVC diam: 2.70 cm LEFT ATRIUM         Index LA diam:    4.40 cm 1.68 cm/m   AORTA Ao Root diam: 3.90 cm Ao Asc diam:  4.00 cm MITRAL VALVE               TRICUSPID VALVE MV Area (PHT): 3.03 cm    TR Peak grad:   32.3 mmHg MV Decel Time: 250 msec    TR Vmax:        284.00 cm/s MV E velocity: 89.50 cm/s MV A velocity: 99.40 cm/s  SHUNTS MV E/A ratio:  0.90        Systemic Diam: 2.30 cm Jenkins Rouge MD Electronically signed by Jenkins Rouge MD Signature Date/Time: 07/19/2021/4:26:45 PM    Final       Subjective:  Air patient denies any shortness of breath, she did have couple episodes of chest pain, with reproducible and not typical while in ED.  Discharge Exam: Vitals:   08/18/21 1200 08/18/21 1300  BP: (!) 149/79 137/81  Pulse: 60 68  Resp: 19 (!) 21  Temp:    SpO2: 99% 100%   Vitals:   08/18/21 1100 08/18/21 1143 08/18/21 1200 08/18/21 1300  BP: 137/71  (!) 149/79 137/81  Pulse: 60  60 68  Resp: 14  19 (!) 21  Temp:  97.9 F (36.6 C)    TempSrc:  Oral    SpO2: 94%  99% 100%    General: Pt is alert, awake, not in acute distress Cardiovascular:  RRR, S1/S2 +, no rubs, no gallops, she did have reproducible chest pain on palpation. Respiratory: CTA bilaterally, no wheezing, no rhonchi Abdominal: Soft, NT, ND,  bowel sounds + Extremities: no edema, no cyanosis    The results of significant diagnostics from this hospitalization (including imaging, microbiology, ancillary and laboratory) are listed below for reference.     Microbiology: Recent Results (from the past 240 hour(s))  Resp Panel by RT-PCR (Flu A&B, Covid) Nasopharyngeal Swab     Status: None   Collection Time: 08/17/21 12:21 PM   Specimen: Nasopharyngeal Swab; Nasopharyngeal(NP) swabs in vial transport medium  Result Value Ref Range Status   SARS Coronavirus 2 by RT PCR NEGATIVE NEGATIVE Final    Comment: (NOTE) SARS-CoV-2 target nucleic acids are NOT DETECTED.  The SARS-CoV-2 RNA is generally detectable in upper respiratory specimens during the acute phase of infection. The lowest concentration of SARS-CoV-2 viral copies this assay can detect is 138 copies/mL. A negative result does not preclude SARS-Cov-2 infection and should not be used as the sole basis for treatment or other patient management decisions. A negative result may occur with  improper specimen collection/handling, submission of specimen other than nasopharyngeal swab, presence of viral mutation(s) within the areas targeted by this assay, and inadequate number of viral copies(<138 copies/mL). A negative result must be combined with clinical observations, patient history, and epidemiological information. The expected result is Negative.  Fact Sheet for Patients:  EntrepreneurPulse.com.au  Fact Sheet for Healthcare Providers:  IncredibleEmployment.be  This test is no t yet approved or cleared by the [place] FDA and  has been authorized for detection and/or diagnosis of SARS-CoV-2 by FDA under an Emergency Use Authorization (EUA). This EUA will remain  in effect (meaning this test can be used) for the duration of the COVID-19 declaration under Section 564(b)(1) of the Act, 21 U.S.C.section 360bbb-3(b)(1), unless the  authorization is terminated  or revoked sooner.       Influenza A by PCR NEGATIVE NEGATIVE Final   Influenza B by PCR NEGATIVE NEGATIVE Final    Comment: (NOTE) The Xpert Xpress SARS-CoV-2/FLU/RSV plus assay is intended as an aid in the diagnosis of influenza from Nasopharyngeal swab specimens and should not be used as a sole basis for treatment. Nasal washings and aspirates are unacceptable for Xpert Xpress SARS-CoV-2/FLU/RSV testing.  Fact Sheet for Patients: EntrepreneurPulse.com.au  Fact Sheet for Healthcare Providers: IncredibleEmployment.be  This test is not yet approved or cleared by the [place] FDA and has been authorized for detection and/or diagnosis of SARS-CoV-2 by FDA under an Emergency Use Authorization (EUA). This EUA will remain in effect (meaning this test can be used) for the duration of the COVID-19 declaration under Section 564(b)(1) of the Act, 21 U.S.C. section 360bbb-3(b)(1), unless the authorization is terminated or revoked.  Performed at [facility], [address] [place],  [address]      Labs: BNP (last 3 results) Recent Labs    07/16/21 1730 07/23/21 0409 08/17/21 1802  BNP 71.1 23.7 57.0   Basic Metabolic Panel: Recent Labs  Lab 08/17/21 1222 08/18/21 0500  NA 140 139  K 3.5 3.2*  CL 102 106  CO2 24 27  GLUCOSE 109* 99  BUN 14 11  CREATININE 1.67* 1.32*  CALCIUM 8.9 8.8*   Liver Function Tests: Recent Labs  Lab 08/17/21 1222  AST 21  ALT 9  ALKPHOS 84  BILITOT 0.8  PROT 7.7  ALBUMIN 3.6   Recent Labs  Lab 08/17/21  1222  LIPASE 21   No results for input(s): AMMONIA in the last 168 hours. CBC: Recent Labs  Lab 08/17/21 1222  WBC 4.8  NEUTROABS 1.6*  HGB 12.9  HCT 40.6  MCV 99.0  PLT 118*   Cardiac Enzymes: No results for input(s): CKTOTAL, CKMB, CKMBINDEX, TROPONINI in the last 168 hours. BNP: Invalid input(s): POCBNP CBG: No results for input(s):  GLUCAP in the last 168 hours. D-Dimer No results for input(s): DDIMER in the last 72 hours. Hgb A1c No results for input(s): HGBA1C in the last 72 hours. Lipid Profile No results for input(s): CHOL, HDL, LDLCALC, TRIG, CHOLHDL, LDLDIRECT in the last 72 hours. Thyroid function studies Recent Labs    08/17/21 1804  TSH 1.469   Anemia work up No results for input(s): VITAMINB12, FOLATE, FERRITIN, TIBC, IRON, RETICCTPCT in the last 72 hours. Urinalysis    Component Value Date/Time   COLORURINE YELLOW 08/17/2021 2357   APPEARANCEUR CLEAR 08/17/2021 2357   LABSPEC 1.010 08/17/2021 2357   PHURINE 8.0 08/17/2021 2357   GLUCOSEU NEGATIVE 08/17/2021 2357   [place] 08/17/2021 2357   [place] 08/17/2021 2357   [place] 08/17/2021 2357   PROTEINUR NEGATIVE 08/17/2021 2357   UROBILINOGEN 0.2 02/07/2013 0203   NITRITE NEGATIVE 08/17/2021 2357   LEUKOCYTESUR NEGATIVE 08/17/2021 2357   Sepsis Labs Invalid input(s): PROCALCITONIN,  WBC,  LACTICIDVEN Microbiology Recent Results (from the past 240 hour(s))  Resp Panel by RT-PCR (Flu A&B, Covid) Nasopharyngeal Swab     Status: None   Collection Time: 08/17/21 12:21 PM   Specimen: Nasopharyngeal Swab; Nasopharyngeal(NP) swabs in vial transport medium  Result Value Ref Range Status   SARS Coronavirus 2 by RT PCR NEGATIVE NEGATIVE Final    Comment: (NOTE) SARS-CoV-2 target nucleic acids are NOT DETECTED.  The SARS-CoV-2 RNA is generally detectable in upper respiratory specimens during the acute phase of infection. The lowest concentration of SARS-CoV-2 viral copies this assay can detect is 138 copies/mL. A negative result does not preclude SARS-Cov-2 infection and should not be used as the sole basis for treatment or other patient management decisions. A negative result may occur with  improper specimen collection/handling, submission of specimen other than nasopharyngeal swab, presence of viral mutation(s)  within the areas targeted by this assay, and inadequate number of viral copies(<138 copies/mL). A negative result must be combined with clinical observations, patient history, and epidemiological information. The expected result is Negative.  Fact Sheet for Patients:  EntrepreneurPulse.com.au  Fact Sheet for Healthcare Providers:  IncredibleEmployment.be  This test is no t yet approved or cleared by the [place] FDA and  has been authorized for detection and/or diagnosis of SARS-CoV-2 by FDA under an Emergency Use Authorization (EUA). This EUA will remain  in effect (meaning this test can be used) for the duration of the COVID-19 declaration under Section 564(b)(1) of the Act, 21 U.S.C.section 360bbb-3(b)(1), unless the authorization is terminated  or revoked sooner.       Influenza A by PCR NEGATIVE NEGATIVE Final   Influenza B by PCR NEGATIVE NEGATIVE Final    Comment: (NOTE) The Xpert Xpress SARS-CoV-2/FLU/RSV plus assay is intended as an aid in the diagnosis of influenza from Nasopharyngeal swab specimens and should not be used as a sole basis for treatment. Nasal washings and aspirates are unacceptable for Xpert Xpress SARS-CoV-2/FLU/RSV testing.  Fact Sheet for Patients: EntrepreneurPulse.com.au  Fact Sheet for Healthcare Providers: IncredibleEmployment.be  This test is not yet approved or cleared by the [place]  FDA and has been authorized for detection and/or diagnosis of SARS-CoV-2 by FDA under an Emergency Use Authorization (EUA). This EUA will remain in effect (meaning this test can be used) for the duration of the COVID-19 declaration under Section 564(b)(1) of the Act, 21 U.S.C. section 360bbb-3(b)(1), unless the authorization is terminated or revoked.  Performed at [facility], [place] [address] [place], [place] [address]      Time coordinating discharge: Over 30  minutes  SIGNED:   Phillips Climes, MD  [facility] 08/18/2021, 1:50 PM Pager   If 7PM-7AM, please contact night-coverage [email or web address] Password [email or web address]

## 2021-08-25 ENCOUNTER — Other Ambulatory Visit: Payer: Self-pay | Admitting: *Deleted

## 2021-08-25 DIAGNOSIS — L03115 Cellulitis of right lower limb: Secondary | ICD-10-CM

## 2021-08-31 ENCOUNTER — Other Ambulatory Visit: Payer: Self-pay

## 2021-08-31 ENCOUNTER — Ambulatory Visit (INDEPENDENT_AMBULATORY_CARE_PROVIDER_SITE_OTHER): Payer: Medicare Other | Admitting: Physician Assistant

## 2021-08-31 ENCOUNTER — Ambulatory Visit (HOSPITAL_COMMUNITY)
Admission: RE | Admit: 2021-08-31 | Discharge: 2021-08-31 | Disposition: A | Discharge status: Home / Self Care | Payer: Medicare Other | Source: Ambulatory Visit | Attending: Vascular Surgery | Admitting: Vascular Surgery

## 2021-08-31 VITALS — BP 160/77 | HR 77 | Temp 97.9°F | Resp 20 | Ht 67.0 in | Wt 358.0 lb

## 2021-08-31 DIAGNOSIS — R143 Flatulence: Secondary | ICD-10-CM | POA: Insufficient documentation

## 2021-08-31 DIAGNOSIS — I890 Lymphedema, not elsewhere classified: Secondary | ICD-10-CM | POA: Diagnosis not present

## 2021-08-31 DIAGNOSIS — R142 Eructation: Secondary | ICD-10-CM | POA: Insufficient documentation

## 2021-08-31 DIAGNOSIS — L03115 Cellulitis of right lower limb: Secondary | ICD-10-CM | POA: Insufficient documentation

## 2021-08-31 DIAGNOSIS — I872 Venous insufficiency (chronic) (peripheral): Secondary | ICD-10-CM

## 2021-08-31 DIAGNOSIS — K5900 Constipation, unspecified: Secondary | ICD-10-CM | POA: Insufficient documentation

## 2021-08-31 DIAGNOSIS — K5730 Diverticulosis of large intestine without perforation or abscess without bleeding: Secondary | ICD-10-CM | POA: Insufficient documentation

## 2021-08-31 DIAGNOSIS — K219 Gastro-esophageal reflux disease without esophagitis: Secondary | ICD-10-CM | POA: Insufficient documentation

## 2021-08-31 DIAGNOSIS — Z1211 Encounter for screening for malignant neoplasm of colon: Secondary | ICD-10-CM | POA: Insufficient documentation

## 2021-08-31 NOTE — Progress Notes (Signed)
Requested by:  Rocky Morel, MD [address] [place],  [place] [address]  Reason for consultation: swelling, edema, weeping    History of Present Illness   [name] is a 80 y.o. (1941/06/29) female who presents for evaluation of bilateral lower extremity swelling with edema and weeping. She says she has had trouble with swelling in her legs and weeping >50 years history of venous insufficiency with BLE stripping in the 1960's. 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 Additionally she had 5 daughters and after her 16th she was diagnosed with CHF. She has had swelling in her legs since then. She has not been able to wear compression stockings because of the size of her legs she cannot get them on. She does elevate in recliner but this does not help. She reports no pain in her legs. They are heavy and get tired easily. She also has numbness from neuropathy in both of her feet. She does not walk much due to her neuropathy and leg swelling. She uses a walker to ambulate. She reports no history of DVT. No family history of DVT  Venous symptoms include:  heavy, tired, swelling Onset/duration:  > 50 years  Occupation:  retired Aggravating factors: sitting, standing, ambulating Alleviating factors: (elevation) Compression:  no Helps:  n/a Pain medications:  none Previous vein procedures:  vein stripping BLE in 1960's History of DVT:  no  Past Medical History:  Diagnosis Date   Arthritis    CHF (congestive heart failure) ([place])    05/2011 echo severe concentric LVH, normal systolic fxn, grade 1 diastolic dysfxn, paradoxical septal motion   Chronic kidney disease-mineral and bone disorder 05/20/2020   Coronary artery disease    Cath 04/2006 normal coronaries, mild-mod slow flow in all coronaries with slow distall runoff, mild-mod LV enlargement, EF 55%    Depression     Dyspnea    Dysrhythmia    GERD (gastroesophageal reflux disease)    Hypercholesterolemia    Hyperlipemia    Hypertension    MI (myocardial infarction) ([place])    NOT SURE HOW MANY   Morbid obesity ([place])    Nocturia    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   Peripheral vascular disease ([place])    Pneumonia NONE RECENT   Sleep apnea    needs cpap but does not wear   Stroke [facility])    TIA (transient ischemic attack)    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    Vertigo    takes meclizine daily    Past Surgical History:  Procedure Laterality Date   ABDOMINAL HYSTERECTOMY     CARDIAC CATHETERIZATION     CATARACT EXTRACTION W/PHACO Left 08/29/2013   Procedure: CATARACT EXTRACTION PHACO AND INTRAOCULAR LENS PLACEMENT ([place]);  Surgeon: Adonis Brook, MD;  Location: [facility];  Service: Ophthalmology;  Laterality: Left;   COLONOSCOPY WITH PROPOFOL N/A 01/07/2014   Procedure: COLONOSCOPY WITH PROPOFOL;  Surgeon: Juanita Craver, MD;  Location: [facility];  Service: Endoscopy;  Laterality: N/A;   CORONARY ANGIOGRAPHY N/A 05/27/2020   Procedure: CORONARY ANGIOGRAPHY;  Surgeon: Dixie Dials, MD;  Location: [facility];  Service: Cardiovascular;  Laterality: N/A;   DILATION AND CURETTAGE OF UTERUS     INSERT / REPLACE / REMOVE PACEMAKER     LEFT HEART CATH AND CORONARY ANGIOGRAPHY N/A 07/18/2020  Procedure: LEFT HEART CATH AND CORONARY ANGIOGRAPHY;  Surgeon: Dixie Dials, MD;  Location: [facility];  Service: Cardiovascular;  Laterality: N/A;   PACEMAKER GENERATOR CHANGE N/A 03/27/2014   Procedure: PACEMAKER GENERATOR CHANGE;  Surgeon: Sanda Klein, MD;  Location: [facility] C[facility] LAB;  Service: Cardiovascular;  Laterality: N/A;   PACEMAKER INSERTION     PACEMAKER INSERTION     TUBAL LIGATION     VASCULAR SURGERY     vericose vein surgery    Social History   Socioeconomic History   Marital status: Widowed    Spouse name: Not on file   Number of children: Not on file    Years of education: Not on file   Highest education level: Not on file  Occupational History   Not on file  Tobacco Use   Smoking status: Never   Smokeless tobacco: Never  Vaping Use   Vaping Use: Never used  Substance and Sexual Activity   Alcohol use: No   Drug use: No   Sexual activity: Not Currently    Birth control/protection: Post-menopausal  Other Topics Concern   Not on file  Social History Narrative   Not on file   Social Determinants of Health   Financial Resource Strain: Not on file  Food Insecurity: Not on file  Transportation Needs: Not on file  Physical Activity: Not on file  Stress: Not on file  Social Connections: Not on file  Intimate Partner Violence: Not on file   No family history on file.  Current Outpatient Medications  Medication Sig Dispense Refill   albuterol (VENTOLIN HFA) 108 (90 Base) MCG/ACT inhaler Inhale 2 puffs into the lungs every 6 (six) hours as needed for wheezing or shortness of breath.      allopurinol (ZYLOPRIM) 100 MG tablet Take 1 tablet (100 mg total) by mouth daily. 90 tablet 1   amiodarone (PACERONE) 200 MG tablet Take 200 mg by mouth daily.     apixaban (ELIQUIS) 5 MG TABS tablet Take 5 mg by mouth 2 (two) times daily.     diclofenac Sodium (VOLTAREN) 1 % GEL Apply 1 application topically 2 (two) times daily as needed for pain.     famotidine (PEPCID) 40 MG tablet Take 40 mg by mouth daily as needed for heartburn or indigestion.      fluticasone (FLONASE) 50 MCG/ACT nasal spray Place 2 sprays into both nostrils at bedtime as needed for allergies or rhinitis.      isosorbide mononitrate (IMDUR) 30 MG 24 hr tablet Take 1 tablet (30 mg total) by mouth daily. 90 tablet 1   metoprolol succinate (TOPROL XL) 50 MG 24 hr tablet Take 1 tablet (50 mg total) by mouth daily. 90 tablet 1   nitroGLYCERIN (NITROSTAT) 0.4 MG SL tablet Place 0.4 mg under the tongue every 5 (five) minutes as needed for chest pain.      oxybutynin (DITROPAN) 5 MG  tablet Take 5 mg by mouth 2 (two) times daily.     oxyCODONE-acetaminophen (PERCOCET/ROXICET) 5-325 MG tablet Take 1 tablet by mouth every 8 (eight) hours as needed for moderate pain (pain).     pantoprazole (PROTONIX) 40 MG tablet Take 40 mg by mouth 2 (two) times daily.      polyethylene glycol (MIRALAX / GLYCOLAX) 17 g packet Take 17 g by mouth daily as needed for mild constipation.     potassium chloride SA (KLOR-CON) 20 MEQ tablet Take 20 mEq by mouth 2 (two) times daily.  rosuvastatin (CRESTOR) 10 MG tablet Take 10 mg by mouth daily.      torsemide (DEMADEX) 20 MG tablet Take 2 tablets (40 mg total) by mouth daily. 180 tablet 1   vitamin B-12 (CYANOCOBALAMIN) 1000 MCG tablet Take 1,000 mcg by mouth daily.     No current facility-administered medications for this visit.    Allergies  Allergen Reactions   Ampicillin Other (See Comments)    Increases heart rhythm  Has tolerated ceftriaxone in the past - 06/2020   Penicillins Other (See Comments)    Increases heart rhythm    Linzess [Linaclotide] Other (See Comments)    Severe stomach pains    REVIEW OF SYSTEMS (negative unless checked):   Cardiac:  []  Chest pain or chest pressure? []  Shortness of breath upon activity? []  Shortness of breath when lying flat? []  Irregular heart rhythm?  Vascular:  []  Pain in calf, thigh, or hip brought on by walking? []  Pain in feet at night that wakes you up from your sleep? []  Blood clot in your veins? X Leg swelling?  Pulmonary:  []  Oxygen at home? []  Productive cough? []  Wheezing?  Neurologic:  []  Sudden weakness in arms or legs? []  Sudden numbness in arms or legs? []  Sudden onset of difficult speaking or slurred speech? []  Temporary loss of vision in one eye? []  Problems with dizziness?  Gastrointestinal:  []  Blood in stool? []  Vomited blood?  Genitourinary:  []  Burning when urinating? []  Blood in urine?  Psychiatric:  []  Major depression  Hematologic:  []   Bleeding problems? []  Problems with blood clotting?  Dermatologic:  []  Rashes or ulcers?  Constitutional:  []  Fever or chills?  Ear/Nose/Throat:  []  Change in hearing? []  Nose bleeds? []  Sore throat?  Musculoskeletal:  []  Back pain? []  Joint pain? []  Muscle pain?   Physical Examination     Vitals:   08/31/21 1433  BP: (!) 160/77  Pulse: 77  Resp: 20  Temp: 97.9 F (36.6 C)  TempSrc: Temporal  SpO2: 97%  Weight: (!) 358 lb (162.4 kg)  Height: 5\' 7"  (1.702 m)   Body mass index is 56.07 kg/m.  General:  morbidly obese, NAD; vital signs documented above Gait: Normal, uses walker HENT: WNL, normocephalic Pulmonary: normal non-labored breathing , without wheezing Cardiac: regular HR, without  Murmurs without carotid bruit Abdomen: obese, soft, NT, no masses Vascular Exam/Pulses:2+ radial pulses bilaterally, 2+ pedal pulses bilaterally. Feet warm and well perfused Extremities: without varicose veins, without reticular veins, with edema of bilateral lower extremities, with stasis pigmentation, with lipodermatosclerosis, lymphorrhea, papillomatosis and hyperplasia. No ulceration    Musculoskeletal: no muscle wasting or atrophy  Neurologic: A&O X 3;  No focal weakness or paresthesias are detected Psychiatric:  The pt has Normal affect.  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 female who presents with: BLE chronic venous insufficiency with swelling/ edema with venous stasis changes. Likely lymphedema present as well with hyperplasia, papillomatosis and lymphorrhea. Patient has >50  years history of venous insufficiency. She has been trying conservative therapies for 50 years and  has previous venous stripping. Her duplex today shows no DVT or SVT. She does have bilateral superficial venous insufficiency in the GSV over short segments. She also has some deep and ssv reflux. I do not feel that she would be good candidate for ablation secondary to her body habitus and also short segments of GSV that are incompetent. She unfortunately cannot tolerate compression secondary to her obese lower extremities and is unable to don and doff the stockings. I think she could benefit from lymphatic pumps to help with her edema. I will make a referral to [facility] for these.  Based on the patient's history and examination, I recommend proper elevation, refraining from prolonged sitting or standing, compression The patient will follow up in 3 months with Dr. Scot Dock. I think it is worth her following up to see if possible she would benefit from any further interventions   Karoline Caldwell, PA-C [facility]: [phone]  08/31/2021, 2:54 PM  Clinic MD: Joycelyn Das

## 2021-12-02 ENCOUNTER — Ambulatory Visit: Payer: Medicare Other | Admitting: Vascular Surgery

## 2022-06-03 ENCOUNTER — Other Ambulatory Visit: Payer: Self-pay

## 2022-06-03 ENCOUNTER — Emergency Department (HOSPITAL_COMMUNITY): Payer: Medicare Other

## 2022-06-03 ENCOUNTER — Encounter (HOSPITAL_COMMUNITY): Payer: Self-pay

## 2022-06-03 ENCOUNTER — Inpatient Hospital Stay (HOSPITAL_COMMUNITY)
Admission: EM | Admit: 2022-06-03 | Discharge: 2022-06-15 | DRG: 811 | Disposition: A | Discharge status: Home Health | Payer: Medicare Other | Attending: Internal Medicine | Admitting: Internal Medicine

## 2022-06-03 ENCOUNTER — Inpatient Hospital Stay (HOSPITAL_COMMUNITY): Payer: Medicare Other

## 2022-06-03 DIAGNOSIS — I130 Hypertensive heart and chronic kidney disease with heart failure and stage 1 through stage 4 chronic kidney disease, or unspecified chronic kidney disease: Secondary | ICD-10-CM | POA: Diagnosis present

## 2022-06-03 DIAGNOSIS — K3184 Gastroparesis: Secondary | ICD-10-CM | POA: Diagnosis present

## 2022-06-03 DIAGNOSIS — I5033 Acute on chronic diastolic (congestive) heart failure: Secondary | ICD-10-CM | POA: Diagnosis present

## 2022-06-03 DIAGNOSIS — L97819 Non-pressure chronic ulcer of other part of right lower leg with unspecified severity: Secondary | ICD-10-CM | POA: Diagnosis present

## 2022-06-03 DIAGNOSIS — I959 Hypotension, unspecified: Secondary | ICD-10-CM | POA: Diagnosis present

## 2022-06-03 DIAGNOSIS — K2970 Gastritis, unspecified, without bleeding: Secondary | ICD-10-CM | POA: Diagnosis not present

## 2022-06-03 DIAGNOSIS — S8992XA Unspecified injury of left lower leg, initial encounter: Secondary | ICD-10-CM | POA: Diagnosis not present

## 2022-06-03 DIAGNOSIS — F32A Depression, unspecified: Secondary | ICD-10-CM | POA: Diagnosis present

## 2022-06-03 DIAGNOSIS — I878 Other specified disorders of veins: Secondary | ICD-10-CM | POA: Diagnosis present

## 2022-06-03 DIAGNOSIS — I480 Paroxysmal atrial fibrillation: Secondary | ICD-10-CM | POA: Diagnosis present

## 2022-06-03 DIAGNOSIS — E662 Morbid (severe) obesity with alveolar hypoventilation: Secondary | ICD-10-CM | POA: Diagnosis present

## 2022-06-03 DIAGNOSIS — Y848 Other medical procedures as the cause of abnormal reaction of the patient, or of later complication, without mention of misadventure at the time of the procedure: Secondary | ICD-10-CM | POA: Diagnosis present

## 2022-06-03 DIAGNOSIS — Z950 Presence of cardiac pacemaker: Secondary | ICD-10-CM

## 2022-06-03 DIAGNOSIS — Z888 Allergy status to other drugs, medicaments and biological substances status: Secondary | ICD-10-CM

## 2022-06-03 DIAGNOSIS — K2100 Gastro-esophageal reflux disease with esophagitis, without bleeding: Secondary | ICD-10-CM | POA: Diagnosis present

## 2022-06-03 DIAGNOSIS — I83028 Varicose veins of left lower extremity with ulcer other part of lower leg: Secondary | ICD-10-CM | POA: Diagnosis present

## 2022-06-03 DIAGNOSIS — J9601 Acute respiratory failure with hypoxia: Secondary | ICD-10-CM | POA: Diagnosis present

## 2022-06-03 DIAGNOSIS — E8771 Transfusion associated circulatory overload: Secondary | ICD-10-CM | POA: Diagnosis not present

## 2022-06-03 DIAGNOSIS — K319 Disease of stomach and duodenum, unspecified: Secondary | ICD-10-CM | POA: Diagnosis present

## 2022-06-03 DIAGNOSIS — K31A0 Gastric intestinal metaplasia, unspecified: Secondary | ICD-10-CM | POA: Diagnosis present

## 2022-06-03 DIAGNOSIS — I110 Hypertensive heart disease with heart failure: Secondary | ICD-10-CM | POA: Diagnosis not present

## 2022-06-03 DIAGNOSIS — R32 Unspecified urinary incontinence: Secondary | ICD-10-CM | POA: Diagnosis present

## 2022-06-03 DIAGNOSIS — L97829 Non-pressure chronic ulcer of other part of left lower leg with unspecified severity: Secondary | ICD-10-CM | POA: Diagnosis present

## 2022-06-03 DIAGNOSIS — K5730 Diverticulosis of large intestine without perforation or abscess without bleeding: Secondary | ICD-10-CM | POA: Diagnosis present

## 2022-06-03 DIAGNOSIS — M109 Gout, unspecified: Secondary | ICD-10-CM | POA: Diagnosis present

## 2022-06-03 DIAGNOSIS — K209 Esophagitis, unspecified without bleeding: Secondary | ICD-10-CM | POA: Diagnosis not present

## 2022-06-03 DIAGNOSIS — R1110 Vomiting, unspecified: Secondary | ICD-10-CM | POA: Diagnosis not present

## 2022-06-03 DIAGNOSIS — A419 Sepsis, unspecified organism: Secondary | ICD-10-CM | POA: Diagnosis present

## 2022-06-03 DIAGNOSIS — I83018 Varicose veins of right lower extremity with ulcer other part of lower leg: Secondary | ICD-10-CM | POA: Diagnosis present

## 2022-06-03 DIAGNOSIS — Z20822 Contact with and (suspected) exposure to covid-19: Secondary | ICD-10-CM | POA: Diagnosis present

## 2022-06-03 DIAGNOSIS — E861 Hypovolemia: Secondary | ICD-10-CM | POA: Diagnosis present

## 2022-06-03 DIAGNOSIS — N189 Chronic kidney disease, unspecified: Secondary | ICD-10-CM | POA: Diagnosis not present

## 2022-06-03 DIAGNOSIS — R197 Diarrhea, unspecified: Secondary | ICD-10-CM

## 2022-06-03 DIAGNOSIS — E876 Hypokalemia: Secondary | ICD-10-CM | POA: Diagnosis not present

## 2022-06-03 DIAGNOSIS — S8991XA Unspecified injury of right lower leg, initial encounter: Secondary | ICD-10-CM | POA: Diagnosis not present

## 2022-06-03 DIAGNOSIS — I252 Old myocardial infarction: Secondary | ICD-10-CM | POA: Diagnosis not present

## 2022-06-03 DIAGNOSIS — D696 Thrombocytopenia, unspecified: Secondary | ICD-10-CM | POA: Diagnosis present

## 2022-06-03 DIAGNOSIS — I495 Sick sinus syndrome: Secondary | ICD-10-CM | POA: Diagnosis present

## 2022-06-03 DIAGNOSIS — N179 Acute kidney failure, unspecified: Secondary | ICD-10-CM | POA: Diagnosis present

## 2022-06-03 DIAGNOSIS — I739 Peripheral vascular disease, unspecified: Secondary | ICD-10-CM | POA: Diagnosis present

## 2022-06-03 DIAGNOSIS — Z7901 Long term (current) use of anticoagulants: Secondary | ICD-10-CM

## 2022-06-03 DIAGNOSIS — D539 Nutritional anemia, unspecified: Secondary | ICD-10-CM | POA: Diagnosis present

## 2022-06-03 DIAGNOSIS — T8089XA Other complications following infusion, transfusion and therapeutic injection, initial encounter: Secondary | ICD-10-CM | POA: Diagnosis present

## 2022-06-03 DIAGNOSIS — I2510 Atherosclerotic heart disease of native coronary artery without angina pectoris: Secondary | ICD-10-CM | POA: Diagnosis not present

## 2022-06-03 DIAGNOSIS — Z6841 Body Mass Index (BMI) 40.0 and over, adult: Secondary | ICD-10-CM | POA: Diagnosis not present

## 2022-06-03 DIAGNOSIS — L03115 Cellulitis of right lower limb: Secondary | ICD-10-CM | POA: Diagnosis present

## 2022-06-03 DIAGNOSIS — S81801A Unspecified open wound, right lower leg, initial encounter: Secondary | ICD-10-CM

## 2022-06-03 DIAGNOSIS — K259 Gastric ulcer, unspecified as acute or chronic, without hemorrhage or perforation: Secondary | ICD-10-CM | POA: Diagnosis not present

## 2022-06-03 DIAGNOSIS — Z8673 Personal history of transient ischemic attack (TIA), and cerebral infarction without residual deficits: Secondary | ICD-10-CM

## 2022-06-03 DIAGNOSIS — I83029 Varicose veins of left lower extremity with ulcer of unspecified site: Secondary | ICD-10-CM | POA: Diagnosis not present

## 2022-06-03 DIAGNOSIS — E538 Deficiency of other specified B group vitamins: Secondary | ICD-10-CM | POA: Diagnosis present

## 2022-06-03 DIAGNOSIS — L7682 Other postprocedural complications of skin and subcutaneous tissue: Secondary | ICD-10-CM | POA: Diagnosis not present

## 2022-06-03 DIAGNOSIS — K529 Noninfective gastroenteritis and colitis, unspecified: Secondary | ICD-10-CM | POA: Diagnosis present

## 2022-06-03 DIAGNOSIS — K8689 Other specified diseases of pancreas: Secondary | ICD-10-CM | POA: Diagnosis present

## 2022-06-03 DIAGNOSIS — R112 Nausea with vomiting, unspecified: Secondary | ICD-10-CM | POA: Diagnosis present

## 2022-06-03 DIAGNOSIS — E8770 Fluid overload, unspecified: Secondary | ICD-10-CM | POA: Diagnosis not present

## 2022-06-03 DIAGNOSIS — N1832 Chronic kidney disease, stage 3b: Secondary | ICD-10-CM | POA: Diagnosis present

## 2022-06-03 DIAGNOSIS — Z79899 Other long term (current) drug therapy: Secondary | ICD-10-CM

## 2022-06-03 DIAGNOSIS — Z880 Allergy status to penicillin: Secondary | ICD-10-CM

## 2022-06-03 DIAGNOSIS — E7800 Pure hypercholesterolemia, unspecified: Secondary | ICD-10-CM | POA: Diagnosis present

## 2022-06-03 DIAGNOSIS — R1011 Right upper quadrant pain: Secondary | ICD-10-CM | POA: Diagnosis not present

## 2022-06-03 DIAGNOSIS — L97911 Non-pressure chronic ulcer of unspecified part of right lower leg limited to breakdown of skin: Secondary | ICD-10-CM | POA: Insufficient documentation

## 2022-06-03 DIAGNOSIS — Z9842 Cataract extraction status, left eye: Secondary | ICD-10-CM

## 2022-06-03 DIAGNOSIS — K2950 Unspecified chronic gastritis without bleeding: Secondary | ICD-10-CM | POA: Diagnosis present

## 2022-06-03 DIAGNOSIS — I509 Heart failure, unspecified: Secondary | ICD-10-CM | POA: Diagnosis not present

## 2022-06-03 DIAGNOSIS — D631 Anemia in chronic kidney disease: Secondary | ICD-10-CM | POA: Diagnosis not present

## 2022-06-03 DIAGNOSIS — I83019 Varicose veins of right lower extremity with ulcer of unspecified site: Secondary | ICD-10-CM | POA: Diagnosis not present

## 2022-06-03 DIAGNOSIS — I5030 Unspecified diastolic (congestive) heart failure: Secondary | ICD-10-CM | POA: Diagnosis not present

## 2022-06-03 LAB — URINALYSIS, ROUTINE W REFLEX MICROSCOPIC
Bacteria, UA: NONE SEEN
Bilirubin Urine: NEGATIVE
Glucose, UA: >=500 mg/dL — AB
Hgb urine dipstick: NEGATIVE
Ketones, ur: NEGATIVE mg/dL
Leukocytes,Ua: NEGATIVE
Nitrite: NEGATIVE
Protein, ur: NEGATIVE mg/dL
Specific Gravity, Urine: 1.01 (ref 1.005–1.030)
pH: 6 (ref 5.0–8.0)

## 2022-06-03 LAB — CBC WITH DIFFERENTIAL/PLATELET
Abs Immature Granulocytes: 0.05 10*3/uL (ref 0.00–0.07)
Basophils Absolute: 0.1 10*3/uL (ref 0.0–0.1)
Basophils Relative: 1 %
Eosinophils Absolute: 0.1 10*3/uL (ref 0.0–0.5)
Eosinophils Relative: 1 %
HCT: 42 % (ref 36.0–46.0)
Hemoglobin: 13.4 g/dL (ref 12.0–15.0)
Immature Granulocytes: 1 %
Lymphocytes Relative: 12 %
Lymphs Abs: 1.1 10*3/uL (ref 0.7–4.0)
MCH: 32.8 pg (ref 26.0–34.0)
MCHC: 31.9 g/dL (ref 30.0–36.0)
MCV: 102.7 fL — ABNORMAL HIGH (ref 80.0–100.0)
Monocytes Absolute: 0.5 10*3/uL (ref 0.1–1.0)
Monocytes Relative: 5 %
Neutro Abs: 7.1 10*3/uL (ref 1.7–7.7)
Neutrophils Relative %: 80 %
Platelets: 131 10*3/uL — ABNORMAL LOW (ref 150–400)
RBC: 4.09 MIL/uL (ref 3.87–5.11)
RDW: 17.9 % — ABNORMAL HIGH (ref 11.5–15.5)
WBC: 8.8 10*3/uL (ref 4.0–10.5)
nRBC: 0 % (ref 0.0–0.2)

## 2022-06-03 LAB — I-STAT CHEM 8, ED
BUN: 29 mg/dL — ABNORMAL HIGH (ref 8–23)
Calcium, Ion: 1.08 mmol/L — ABNORMAL LOW (ref 1.15–1.40)
Chloride: 106 mmol/L (ref 98–111)
Creatinine, Ser: 2.1 mg/dL — ABNORMAL HIGH (ref 0.44–1.00)
Glucose, Bld: 122 mg/dL — ABNORMAL HIGH (ref 70–99)
HCT: 45 % (ref 36.0–46.0)
Hemoglobin: 15.3 g/dL — ABNORMAL HIGH (ref 12.0–15.0)
Potassium: 4.3 mmol/L (ref 3.5–5.1)
Sodium: 144 mmol/L (ref 135–145)
TCO2: 27 mmol/L (ref 22–32)

## 2022-06-03 LAB — COMPREHENSIVE METABOLIC PANEL
ALT: 13 U/L (ref 0–44)
AST: 26 U/L (ref 15–41)
Albumin: 3.5 g/dL (ref 3.5–5.0)
Alkaline Phosphatase: 96 U/L (ref 38–126)
Anion gap: 9 (ref 5–15)
BUN: 24 mg/dL — ABNORMAL HIGH (ref 8–23)
CO2: 26 mmol/L (ref 22–32)
Calcium: 8.6 mg/dL — ABNORMAL LOW (ref 8.9–10.3)
Chloride: 106 mmol/L (ref 98–111)
Creatinine, Ser: 1.99 mg/dL — ABNORMAL HIGH (ref 0.44–1.00)
GFR, Estimated: 25 mL/min — ABNORMAL LOW (ref >60)
Glucose, Bld: 124 mg/dL — ABNORMAL HIGH (ref 70–99)
Potassium: 4.5 mmol/L (ref 3.5–5.1)
Sodium: 141 mmol/L (ref 135–145)
Total Bilirubin: 0.7 mg/dL (ref 0.3–1.2)
Total Protein: 7.9 g/dL (ref 6.5–8.1)

## 2022-06-03 LAB — LIPASE, BLOOD: Lipase: 29 U/L (ref 11–51)

## 2022-06-03 LAB — BRAIN NATRIURETIC PEPTIDE: B Natriuretic Peptide: 124.3 pg/mL — ABNORMAL HIGH (ref 0.0–100.0)

## 2022-06-03 LAB — RESP PANEL BY RT-PCR (FLU A&B, COVID) ARPGX2
Influenza A by PCR: NEGATIVE
Influenza B by PCR: NEGATIVE
SARS Coronavirus 2 by RT PCR: NEGATIVE

## 2022-06-03 LAB — APTT: aPTT: 28 seconds (ref 24–36)

## 2022-06-03 LAB — LACTIC ACID, PLASMA
Lactic Acid, Venous: 2.4 mmol/L (ref 0.5–1.9)
Lactic Acid, Venous: 3 mmol/L (ref 0.5–1.9)
Lactic Acid, Venous: 2.2 mmol/L (ref 0.5–1.9)

## 2022-06-03 LAB — PROTIME-INR
INR: 1.1 (ref 0.8–1.2)
Prothrombin Time: 13.9 seconds (ref 11.4–15.2)

## 2022-06-03 LAB — TROPONIN I (HIGH SENSITIVITY)
Troponin I (High Sensitivity): 10 ng/L (ref <18)
Troponin I (High Sensitivity): 10 ng/L (ref <18)

## 2022-06-03 MED ORDER — SODIUM CHLORIDE 0.9 % IV SOLN: INTRAVENOUS | Status: DC | PRN

## 2022-06-03 MED ORDER — ISOSORBIDE MONONITRATE ER 30 MG PO TB24: 30.0000 mg | ORAL_TABLET | Freq: Every day | ORAL | Status: DC

## 2022-06-03 MED ORDER — SODIUM CHLORIDE 0.9 % IV SOLN
1.0000 g | Freq: Once | INTRAVENOUS | Status: AC
  Administered 2022-06-03: 1 g via INTRAVENOUS
  Filled 2022-06-03: qty 10

## 2022-06-03 MED ORDER — ALBUTEROL SULFATE (2.5 MG/3ML) 0.083% IN NEBU
2.5000 mg | INHALATION_SOLUTION | Freq: Four times a day (QID) | RESPIRATORY_TRACT | Status: DC | PRN
Start: 2022-06-03 — End: 2022-06-06
  Administered 2022-06-03 – 2022-06-06 (×2): 2.5 mg via RESPIRATORY_TRACT
  Filled 2022-06-03 (×2): qty 3

## 2022-06-03 MED ORDER — PROCHLORPERAZINE EDISYLATE 10 MG/2ML IJ SOLN
10.0000 mg | Freq: Once | INTRAMUSCULAR | Status: AC
  Administered 2022-06-03: 10 mg via INTRAVENOUS
  Filled 2022-06-03: qty 2

## 2022-06-03 MED ORDER — FUROSEMIDE 10 MG/ML IJ SOLN
40.0000 mg | Freq: Once | INTRAMUSCULAR | Status: AC
Start: 2022-06-03 — End: 2022-06-03
  Administered 2022-06-03: 40 mg via INTRAVENOUS
  Filled 2022-06-03: qty 4

## 2022-06-03 MED ORDER — PANTOPRAZOLE SODIUM 40 MG PO TBEC
40.0000 mg | DELAYED_RELEASE_TABLET | Freq: Two times a day (BID) | ORAL | Status: DC
Start: 2022-06-03 — End: 2022-06-03

## 2022-06-03 MED ORDER — FUROSEMIDE 10 MG/ML IJ SOLN
60.0000 mg | Freq: Once | INTRAMUSCULAR | Status: AC
  Administered 2022-06-03: 60 mg via INTRAVENOUS
  Filled 2022-06-03: qty 6

## 2022-06-03 MED ORDER — ROSUVASTATIN CALCIUM 5 MG PO TABS
10.0000 mg | ORAL_TABLET | Freq: Every day | ORAL | Status: DC
  Administered 2022-06-03 – 2022-06-15 (×13): 10 mg via ORAL
  Filled 2022-06-03 (×13): qty 2

## 2022-06-03 MED ORDER — SODIUM CHLORIDE 0.9 % IV BOLUS
500.0000 mL | Freq: Once | INTRAVENOUS | Status: AC
  Administered 2022-06-03: 500 mL via INTRAVENOUS

## 2022-06-03 MED ORDER — VITAMIN B-12 1000 MCG PO TABS
1000.0000 ug | ORAL_TABLET | Freq: Every day | ORAL | Status: DC
  Administered 2022-06-03 – 2022-06-15 (×13): 1000 ug via ORAL
  Filled 2022-06-03 (×13): qty 1

## 2022-06-03 MED ORDER — ACETAMINOPHEN 650 MG RE SUPP: 650.0000 mg | Freq: Four times a day (QID) | RECTAL | Status: DC | PRN

## 2022-06-03 MED ORDER — HYDROMORPHONE HCL 1 MG/ML IJ SOLN
0.2500 mg | Freq: Four times a day (QID) | INTRAMUSCULAR | Status: DC | PRN
  Administered 2022-06-04 – 2022-06-12 (×5): 0.25 mg via INTRAVENOUS
  Filled 2022-06-03 (×5): qty 0.5

## 2022-06-03 MED ORDER — METRONIDAZOLE 500 MG/100ML IV SOLN
500.0000 mg | Freq: Two times a day (BID) | INTRAVENOUS | Status: DC
  Administered 2022-06-03 (×2): 500 mg via INTRAVENOUS
  Filled 2022-06-03 (×2): qty 100

## 2022-06-03 MED ORDER — PANTOPRAZOLE SODIUM 40 MG PO TBEC
40.0000 mg | DELAYED_RELEASE_TABLET | Freq: Every day | ORAL | Status: DC
  Administered 2022-06-03 – 2022-06-09 (×7): 40 mg via ORAL
  Filled 2022-06-03 (×7): qty 1

## 2022-06-03 MED ORDER — FENTANYL CITRATE PF 50 MCG/ML IJ SOSY
50.0000 ug | PREFILLED_SYRINGE | Freq: Once | INTRAMUSCULAR | Status: AC
  Administered 2022-06-03: 50 ug via INTRAVENOUS
  Filled 2022-06-03: qty 1

## 2022-06-03 MED ORDER — FENTANYL CITRATE PF 50 MCG/ML IJ SOSY: 50.0000 ug | PREFILLED_SYRINGE | Freq: Once | INTRAMUSCULAR | Status: DC

## 2022-06-03 MED ORDER — APIXABAN 5 MG PO TABS
5.0000 mg | ORAL_TABLET | Freq: Two times a day (BID) | ORAL | Status: DC
  Administered 2022-06-03 – 2022-06-11 (×17): 5 mg via ORAL
  Filled 2022-06-03 (×17): qty 1

## 2022-06-03 MED ORDER — SODIUM CHLORIDE 0.9 % IV SOLN
2.0000 g | INTRAVENOUS | Status: DC
  Administered 2022-06-04: 2 g via INTRAVENOUS
  Filled 2022-06-03: qty 20

## 2022-06-03 MED ORDER — OXYBUTYNIN CHLORIDE 5 MG PO TABS
5.0000 mg | ORAL_TABLET | Freq: Two times a day (BID) | ORAL | Status: DC
  Administered 2022-06-03 – 2022-06-15 (×24): 5 mg via ORAL
  Filled 2022-06-03 (×25): qty 1

## 2022-06-03 MED ORDER — ACETAMINOPHEN 325 MG PO TABS
650.0000 mg | ORAL_TABLET | Freq: Four times a day (QID) | ORAL | Status: DC | PRN
Start: 2022-06-03 — End: 2022-06-15
  Administered 2022-06-03 – 2022-06-15 (×13): 650 mg via ORAL
  Filled 2022-06-03 (×13): qty 2

## 2022-06-03 MED ORDER — VANCOMYCIN HCL 1250 MG/250ML IV SOLN: 1250.0000 mg | INTRAVENOUS | Status: DC

## 2022-06-03 MED ORDER — SODIUM CHLORIDE 0.9 % IV SOLN: INTRAVENOUS | Status: DC

## 2022-06-03 MED ORDER — AMIODARONE HCL 200 MG PO TABS
200.0000 mg | ORAL_TABLET | Freq: Every day | ORAL | Status: DC
  Administered 2022-06-03 – 2022-06-15 (×13): 200 mg via ORAL
  Filled 2022-06-03 (×13): qty 1

## 2022-06-03 MED ORDER — VANCOMYCIN HCL 2000 MG/400ML IV SOLN
2000.0000 mg | Freq: Once | INTRAVENOUS | Status: AC
  Administered 2022-06-03: 2000 mg via INTRAVENOUS
  Filled 2022-06-03: qty 400

## 2022-06-03 MED ORDER — FUROSEMIDE 10 MG/ML IJ SOLN
INTRAMUSCULAR | Status: AC
  Filled 2022-06-03: qty 4

## 2022-06-03 NOTE — Progress Notes (Signed)
Paged about worsening dyspnea, recurrent episode of loose stools following transport to the floor.  Patient endorsing trouble breathing, back pain, and headache as well as moderate chest pain. Pain is described as sharp and approximately 5-6/10 without radiation. She denies nausea or vomiting after being moved to floor or any other new symptoms. Her main concern is worsening dyspnea. Oxygen requirement increased to 3L [place] on transport.  V: BP 164/82, P 70, RR 24, O2 sat 89-91% on 3L [place]  Exam: General: in moderate distress, interactive and responds to questions appropriately Cardiovascular: RRR, no MRG. JVD difficult to assess due to body habitus Respiratory: increased WOB on 3L [place], diffuse crackles on auscultation Abd: Mild tenderness to palpation over epigastric region, mildly distended abdomen, no rebound tenderness Extremities: 2+ BLE edema, previously mentioned wounds covered with clean, dry dressings  #Dyspnea, Chest pain: #HFpEF Pt had previously visualized pulmonary edema on initial CXR, received IVF for volume resuscitation, very little UOP charted following initial dose of lasix 40mg  IV. Appears more volume overloaded on exam now with hypertension, diffuse crackles/BLE edema. Low concern for ACS or PNA at this time. -increased supplemental O2 to 5L  and sats in 93-95%, wean as possible. -Will repeat CXR and give IV lasix 60mg . -12 lead EKG  #diarrhea #abd pain Pt came in for n/v/d reporting 5-6 episodes in the past day or two. Had no diarrhea in ED but did have one more episode during transport to floor. No recent abx. Persistent abd/back pain and patient does take percocet at home for back/knee pain. -GIP, C Dif screen + enteric precautions placed -dilaudid 0.25mg  q6PRN

## 2022-06-03 NOTE — ED Notes (Signed)
Urine culture sent with urine sample. 

## 2022-06-03 NOTE — Progress Notes (Signed)
Patient received from ED via stretcher. Patient cleansed of incontinent stool episode. On Call Family medicine paged to reevaluate patient. Patient stated " she doesn't feel good"

## 2022-06-03 NOTE — ED Notes (Signed)
Pt taken to CT at this time.

## 2022-06-03 NOTE — ED Notes (Signed)
MD notified of critical lactic  

## 2022-06-03 NOTE — Consult Note (Addendum)
WOC Nurse Consult Note: Reason for Consult: Full thickness wound to RLE, partial thickness wound to LLE.  Patient is followed by the [facility] (Dr. Jerilynn Mages. [place]) and was last seen in that office on 05/27/22. She was seen by [facility] (Dr. Johnette Abraham. [place]) yesterday (06/02/22) for evaluation and a punch biopsy of the RLE wound was performed to rule out pyoderma gangrenosum vs venous insufficiency in the presence of lymphedema.  She is to continue follow up with both providers. See photo taken and uploaded to EMR by Dr. Leonette Monarch, [place]. Wound type: Infectious in the presence of lymphedema and pyoderma gangrenosum vs venous insufficiency Pressure Injury POA: N/A Measurement: Per Dr. Lisabeth Devoid, RLE wound measures 3cm x 4cm  Wound QBV:[id number]: red, left: reepithelializing Drainage (amount, consistency, odor) Moderate serous to light yellow Periwound:with chronic skin changes consistent with lymphedema Dressing procedure/placement/frequency: Following discussion with Dr. Lisabeth Devoid via [place], I will provide Nursing with guidance for  continuation of the patient's existing POC, using an every-other-day soap and water cleanse of the legs followed by rinse and pat dry. Topical care to the two wounds will be with silver hydrofiber (Aquacel Ag+ Advantage, [place] # [id number]) secured with Kerlix roll gauze applied by wrapping from just below toes to just below knees. The Kerlix roll gauze will be topped by a 6-inch ACE bandage applied in the same manner in which the Kerlix was applied, i.e., wrapped from just below toes to just below knees. This is to be performed today and then every-other-day. Heels are to be floated and a sacral prophylactic foam dressing is to be placed for PI prevention per house protocol.   Patient to follow up with both the outpatient wound care center at [facility] and Dr. Elease Etienne [facility]) as she was directed.   [facility] nursing team will not follow, but will remain  available to this patient, the nursing and medical teams.  Please re-consult if needed.  Thank you for inviting [place] to participate in this patient's Plan of Care.  Maudie Flakes, MSN, RN, CNS, [place], Serita Grammes, [employer], [employer] phone:  [phone]

## 2022-06-03 NOTE — ED Notes (Signed)
Phlebotomy asked to get 2nd set of cultures

## 2022-06-03 NOTE — ED Provider Notes (Signed)
Internal medicine teaching service will admit the patient.  Urinalysis has come back unremarkable and given concern there could be a cellulitis because, vancomycin will be added.  At this point her vital signs seems to be stable and she appears appropriate for a medical admission.   Sherwood Gambler, MD 06/03/22 [phone]

## 2022-06-03 NOTE — ED Provider Notes (Addendum)
[facility]ERGENC[facility] DEPARTMENT Provider Note  CSN: [id number] Arrival date & time: 06/03/22 0131  Chief Complaint(s) Nausea, Emesis, and Diarrhea  HPI [name] is a 81 y.o. female with a past medical history listed below including hypertension, hyperlipidemia, CAD, congestive heart failure with a last EF of 50%, second-degree heart block status post pacemaker, stroke on Eliquis who presents to the emergency department with several hours of abdominal pain, nausea, nonbloody nonbilious emesis, and diarrhea.  Abdominal pain is epigastric/left upper quadrant.  No known sick contacts.  No suspicious food intake.  No chest pain or shortness of breath.    Patient has a history of chronic leg wounds which is being managed by the wound clinic and reportedly improving with treatment.  She reportedly went to dermatology for evaluation today and they performed a biopsy. Not currently on antibiotics.  The history is provided by the patient and the EMS personnel.    Past Medical History Past Medical History:  Diagnosis Date   Arthritis    CHF (congestive heart failure) ([place])    05/2011 echo severe concentric LVH, normal systolic fxn, grade 1 diastolic dysfxn, paradoxical septal motion   Chronic kidney disease-mineral and bone disorder 05/20/2020   Coronary artery disease    Cath 04/2006 normal coronaries, mild-mod slow flow in all coronaries with slow distall runoff, mild-mod LV enlargement, EF 55%    Depression    Dyspnea    Dysrhythmia    GERD (gastroesophageal reflux disease)    Hypercholesterolemia    Hyperlipemia    Hypertension    MI (myocardial infarction) ([place])    NOT SURE HOW MANY   Morbid obesity ([place])    Nocturia    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   Peripheral vascular disease ([place])    Pneumonia NONE RECENT   Sleep apnea    needs cpap but does not wear   Stroke [facility])    TIA (transient ischemic attack)    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    Vertigo    takes meclizine daily   Patient Active Problem List   Diagnosis Date Noted   Colon cancer screening 08/31/2021   Constipation 08/31/2021   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   Flatulence, eructation and gas pain 08/31/2021   Gastroesophageal reflux disease 08/31/2021   Chest pressure 08/18/2021   Palpitations 08/18/2021   Prolonged QT interval 08/18/2021   PNA (pneumonia) 07/16/2021   Community acquired pneumonia 07/16/2021   Hammer toes of both feet 12/31/2020   Onychomycosis due to dermatophyte 12/31/2020   Miosis 10/13/2020   Nuclear sclerotic cataract 10/13/2020   Pressure injury of skin 09/20/2020   Acute on chronic heart failure with preserved ejection fraction (HFpEF) ([place]) 09/20/2020   Bacteremia due to Escherichia coli 09/19/2020   Gram-negative sepsis, unspecified ([place]) 09/19/2020   Cellulitis of right leg 07/25/2020   NSTEMI (non-ST elevated myocardial infarction) ([place]) 07/16/2020   Chronic diastolic CHF (congestive heart failure) ([place]) 07/16/2020   Paroxysmal atrial fibrillation ([place]) 07/16/2020   Hypotension 07/16/2020   Body mass index (BMI) 45.0-49.9, adult ([place]) 06/24/2020   Hypokalemia 06/01/2020   Shortness of breath at rest 05/22/2020    Class: Acute   Palpitation 05/22/2020    Class: Acute   Shortness of breath 05/22/2020   Chronic kidney disease-mineral and bone disorder 05/20/2020   Edema 05/20/2020   Gout 05/20/2020   Hypersomnia 05/20/2020   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   Sleep  apnea 05/20/2020   Acute left systolic heart failure ([place]) 44/96/7591   Acute systolic heart failure ([place]) 06/18/2019   SSS (sick sinus syndrome) ([place]) 08/01/2015   Third degree AV block ([place]) 03/27/2014   Symptomatic sinus bradycardia 03/27/2014   Pacemaker at end of battery life 03/27/2014   CVA (cerebral infarction) 10/17/2012   Headache(784.0) 10/17/2012   Headache 10/17/2012   Coronary artery disease     Hypertension    Hyperlipemia    Arthritis    CHF (congestive heart failure) ([facility])    Vertigo    TIA (transient ischemic attack)    Pacemaker    MI (myocardial infarction) ([place])    Morbid obesity ([place])    Home Medication(s) Prior to Admission medications   Medication Sig Start Date End Date Taking? Authorizing Provider  albuterol (VENTOLIN HFA) 108 (90 Base) MCG/ACT inhaler Inhale 2 puffs into the lungs every 6 (six) hours as needed for wheezing or shortness of breath.  10/12/19   [provider]  allopurinol (ZYLOPRIM) 100 MG tablet Take 1 tablet (100 mg total) by mouth daily. 07/23/21   Mercy Riding, MD  amiodarone (PACERONE) 200 MG tablet Take 200 mg by mouth daily.    [provider]  apixaban (ELIQUIS) 5 MG TABS tablet Take 5 mg by mouth 2 (two) times daily. 02/11/17   [provider]  diclofenac Sodium (VOLTAREN) 1 % GEL Apply 1 application topically 2 (two) times daily as needed for pain. 06/17/21   [provider]  famotidine (PEPCID) 40 MG tablet Take 40 mg by mouth daily as needed for heartburn or indigestion.  11/29/19   [provider]  fluticasone (FLONASE) 50 MCG/ACT nasal spray Place 2 sprays into both nostrils at bedtime as needed for allergies or rhinitis.  07/08/19   [provider]  isosorbide mononitrate (IMDUR) 30 MG 24 hr tablet Take 1 tablet (30 mg total) by mouth daily. 07/23/21   Mercy Riding, MD  metoprolol succinate (TOPROL XL) 50 MG 24 hr tablet Take 1 tablet (50 mg total) by mouth daily. 07/23/21 01/19/22  Mercy Riding, MD  nitroGLYCERIN (NITROSTAT) 0.4 MG SL tablet Place 0.4 mg under the tongue every 5 (five) minutes as needed for chest pain.     [provider]  oxybutynin (DITROPAN) 5 MG tablet Take 5 mg by mouth 2 (two) times daily.    [provider]  oxyCODONE-acetaminophen (PERCOCET/ROXICET) 5-325 MG tablet Take 1 tablet by mouth every 8 (eight) hours as needed for moderate pain (pain).  11/20/19   [provider]  pantoprazole (PROTONIX) 40 MG tablet Take 40 mg by mouth 2 (two) times daily.     [provider]  polyethylene glycol (MIRALAX / GLYCOLAX) 17 g packet Take 17 g by mouth daily as needed for mild constipation.    [provider]  potassium chloride SA (KLOR-CON) 20 MEQ tablet Take 20 mEq by mouth 2 (two) times daily. 07/17/21   [provider]  rosuvastatin (CRESTOR) 10 MG tablet Take 10 mg by mouth daily.     [provider]  torsemide (DEMADEX) 20 MG tablet Take 2 tablets (40 mg total) by mouth daily. 07/24/21   Mercy Riding, MD  vitamin B-12 (CYANOCOBALAMIN) 1000 MCG tablet Take 1,000 mcg by mouth daily.    [provider]  Allergies Ampicillin, Penicillins, and Linzess [linaclotide]  Review of Systems Review of Systems As noted in HPI  Physical Exam Vital Signs  I have reviewed the triage vital signs BP 102/53   Pulse 60   Temp (!) 101.55F (38.8 C) (Axillary)   Resp 16   Ht 5\' 7"  (1.702 m)   Wt (!) 149.7 kg   SpO2 96%   BMI 51.69 kg/m   Physical Exam Vitals reviewed.  Constitutional:      General: She is not in acute distress.    Appearance: She is well-developed. She is morbidly obese. She is not diaphoretic.  HENT:     Head: Normocephalic and atraumatic.     Nose: Nose normal.  Eyes:     General: No scleral icterus.       Right eye: No discharge.        Left eye: No discharge.     Conjunctiva/sclera: Conjunctivae normal.     Pupils: Pupils are equal, round, and reactive to light.  Cardiovascular:     Rate and Rhythm: Normal rate and regular rhythm.     Heart sounds: No murmur heard.    No friction rub. No gallop.  Pulmonary:     Effort: Pulmonary effort is normal. No respiratory distress.     Breath sounds: Normal breath sounds. No stridor. No rales.   Abdominal:     General: There is no distension.     Palpations: Abdomen is soft.     Tenderness: There is abdominal tenderness in the left upper quadrant. There is no guarding or rebound.  Musculoskeletal:        General: No tenderness.     Cervical back: Normal range of motion and neck supple.       Legs:  Skin:    General: Skin is warm and dry.     Findings: No erythema or rash.  Neurological:     Mental Status: She is alert and oriented to person, place, and time.        ED Results and Treatments Labs (all labs ordered are listed, but only abnormal results are displayed) Labs Reviewed  LACTIC ACID, PLASMA - Abnormal; Notable for the following components:      Result Value   Lactic Acid, Venous 2.4 (*)    All other components within normal limits  LACTIC ACID, PLASMA - Abnormal; Notable for the following components:   Lactic Acid, Venous 3.0 (*)    All other components within normal limits  COMPREHENSIVE METABOLIC PANEL - Abnormal; Notable for the following components:   Glucose, Bld 124 (*)    BUN 24 (*)    Creatinine, Ser 1.99 (*)    Calcium 8.6 (*)    GFR, Estimated 25 (*)    All other components within normal limits  CBC WITH DIFFERENTIAL/PLATELET - Abnormal; Notable for the following components:   MCV 102.7 (*)    RDW 17.9 (*)    Platelets 131 (*)    All other components within normal limits  BRAIN NATRIURETIC PEPTIDE - Abnormal; Notable for the following components:   B Natriuretic Peptide 124.3 (*)    All other components within normal limits  I-STAT CHEM 8, ED - Abnormal; Notable for the following components:   BUN 29 (*)    Creatinine, Ser 2.10 (*)    Glucose, Bld 122 (*)    Calcium, Ion 1.08 (*)    Hemoglobin 15.3 (*)    All other components within normal limits  RESP PANEL BY  RT-PCR (FLU A&B, COVID) ARPGX2  CULTURE, BLOOD (ROUTINE X 2)  CULTURE, BLOOD (ROUTINE X 2)  LIPASE, BLOOD  URINALYSIS, ROUTINE W REFLEX MICROSCOPIC  PROTIME-INR  APTT   TROPONIN I (HIGH SENSITIVITY)  TROPONIN I (HIGH SENSITIVITY)                                                                                                                         EKG  EKG Interpretation  Date/Time:  Thursday June 03 2022 01:40:52 EDT Ventricular Rate:  69 PR Interval:  254 QRS Duration: 186 QT Interval:  480 QTC Calculation: 515 R Axis:   -85 Text Interpretation: Sinus rhythm Prolonged PR interval Consider left atrial enlargement Left bundle branch block Confirmed by Addison Lank [phone]) on 06/03/2022 5:21:37 AM       Radiology CT ABDOMEN PELVIS WO CONTRAST  Result Date: 06/03/2022 CLINICAL DATA:  Acute, nonlocalized abdominal pain. Nausea, vomiting, and diarrhea EXAM: CT ABDOMEN AND PELVIS WITHOUT CONTRAST TECHNIQUE: Multidetector CT imaging of the abdomen and pelvis was performed following the standard protocol without IV contrast. RADIATION DOSE REDUCTION: This exam was performed according to the departmental dose-optimization program which includes automated exposure control, adjustment of the mA and/or kV according to patient size and/or use of iterative reconstruction technique. COMPARISON:  07/16/2020 FINDINGS: Lower chest: Streaky density at the lung bases with volume loss-mild atelectasis. Cardiomegaly. Dual-chamber pacer with somewhat redundant right ventricular lead looping inferiorly. Hepatobiliary: No focal liver abnormality.High-density in the dependent gallbladder could be streak artifact. No definite cholelithiasis; no cholecystitis. Pancreas: Generalized atrophy without acute finding Spleen: Unremarkable. Adrenals/Urinary Tract: Negative adrenals. No hydronephrosis or stone. Partially covered right renal cystic intensity. Unremarkable bladder. Stomach/Bowel: Small sliding hiatal hernia. Extensive colonic diverticulosis. No bowel obstruction or visible inflammation, including of the appendix Vascular/Lymphatic: No acute vascular abnormality. Mild for age  atheromatous calcification. No mass or adenopathy. Reproductive:Hysterectomy Other: No ascites or pneumoperitoneum. Musculoskeletal: No acute abnormalities. Advanced lumbar spine degeneration. IMPRESSION: No acute finding. Negative for bowel obstruction or visible inflammation. Electronically Signed   By: Jorje Guild M.D.   On: 06/03/2022 06:10   DG Chest Port 1 View  Result Date: 06/03/2022 CLINICAL DATA:  Questionable sepsis - evaluate for abnormality Nausea, vomiting, diarrhea. EXAM: PORTABLE CHEST 1 VIEW COMPARISON:  Radiograph 08/17/2021 FINDINGS: Right-sided pacemaker in place. Cardiomegaly is stable. Unchanged mediastinal contours. There is vascular congestion. Interstitial thickening suggest pulmonary edema. Trace fluid in the right minor fissure. There may be small pleural effusions, not well assessed on this portable AP view. No pneumothorax. IMPRESSION: Cardiomegaly with vascular congestion and interstitial thickening suspicious for pulmonary edema. Suspect small pleural effusions. Electronically Signed   By: Keith Rake M.D.   On: 06/03/2022 02:00    Medications Ordered in ED Medications  0.9 %  sodium chloride infusion ( Intravenous New Bag/Given 06/03/22 0223)  acetaminophen (TYLENOL) tablet 650 mg (650 mg Oral Given 06/03/22 0337)  metroNIDAZOLE (FLAGYL) IVPB 500 mg (500 mg Intravenous New Bag/Given 06/03/22 0618)  prochlorperazine (COMPAZINE) injection 10 mg (10 mg Intravenous Given 06/03/22 0216)  furosemide (LASIX) injection 40 mg (40 mg Intravenous Given 06/03/22 0340)  fentaNYL (SUBLIMAZE) injection 50 mcg (50 mcg Intravenous Given 06/03/22 0338)  sodium chloride 0.9 % bolus 500 mL (0 mLs Intravenous Stopped 06/03/22 0703)  cefTRIAXone (ROCEPHIN) 1 g in sodium chloride 0.9 % 100 mL IVPB (0 g Intravenous Stopped 06/03/22 0656)  sodium chloride 0.9 % bolus 500 mL (500 mLs Intravenous New Bag/Given 06/03/22 0652)                                                                                                                                      Procedures .Critical Care  Performed by: Fatima Blank, MD Authorized by: Fatima Blank, MD   Critical care provider statement:    Critical care time (minutes):  45   Critical care time was exclusive of:  Separately billable procedures and treating other patients   Critical care was necessary to treat or prevent imminent or life-threatening deterioration of the following conditions:  Sepsis   Critical care was time spent personally by me on the following activities:  Development of treatment plan with patient or surrogate, discussions with consultants, evaluation of patient's response to treatment, examination of patient, obtaining history from patient or surrogate, review of old charts, re-evaluation of patient's condition, pulse oximetry, ordering and review of radiographic studies, ordering and review of laboratory studies and ordering and performing treatments and interventions   Care discussed with: admitting provider   .1-3 Lead EKG Interpretation  Performed by: Fatima Blank, MD Authorized by: Fatima Blank, MD     Interpretation: normal     ECG rate:  63   ECG rate assessment: normal     Rhythm: sinus rhythm     Ectopy: none     Conduction: normal     (including critical care time)  Medical Decision Making / ED Course   Medical Decision Making Amount and/or Complexity of Data Reviewed Independent Historian: caregiver and EMS Labs: ordered. Decision-making details documented in ED Course. Radiology: ordered and independent interpretation performed. Decision-making details documented in ED Course. ECG/medicine tests: ordered and independent interpretation performed. Decision-making details documented in ED Course.  Risk OTC drugs. Prescription drug management. Parenteral controlled substances. Decision regarding hospitalization.  Patient presents to the emergency department  with abdominal discomfort with nausea, vomiting, diarrhea.  Left upper quadrant abdominal discomfort to palpation.  She is noted to be febrile with soft blood pressures.  Septic workup initiated.  Given significant comorbidities, cardiac workup was also obtained.  Will also assess for heart failure status.  EKG without acute ischemic changes no evidence of pericarditis.  Laboratory Tests ordered listed below with my independent interpretation: CBC without leukocytosis or anemia. Metabolic panel without significant electrolyte derangements.  She is got mild AKI. No evidence of biliary obstruction or pancreatitis. Lactic acid elevated at 2.4 COVID/influenza  negative Initial troponin negative. Second trop unchanged. BNP less than 125 UA pending.   Imaging Studies ordered listed below with my independent interpretation: Chest x-ray without evidence of pneumonia, pneumothorax.  It shows pulmonary vascular congestion with possible edema. CT of the abdomen pelvis without evidence of acute intra-abdominal inflammatory/infectious process or bowel obstruction   Patient was initially given saline infusion.  After elevated lactic acid, 500 cc IV fluid bolus was ordered. She was started on empiric antibiotics for unknown source that would cover skin, respiratory, intra-abdominal, urinary source.  Lactic acid trending up slightly. Added additional IVF.  Will call for admission.        Final Clinical Impression(s) / ED Diagnoses Final diagnoses:  Sepsis with acute renal failure without septic shock, due to unspecified organism, unspecified acute renal failure type ([place])  Open wound of right lower leg, initial encounter           This chart was dictated using voice recognition software.  Despite best efforts to proofread,  errors can occur which can change the documentation meaning.      Fatima Blank, MD 06/03/22 [phone]

## 2022-06-03 NOTE — ED Notes (Signed)
Neyra Pettie (Granddaughter) would like an update. [phone]

## 2022-06-03 NOTE — H&P (Addendum)
Date: 06/03/2022               Patient Name:  [name] MRN: [id number]  DOB: December 31, 1940 Age / Sex: 81 y.o., female   PCP: Rocky Morel, MD         Medical Service: Internal Medicine Teaching Service         Attending Physician: Dr. Lottie Mussel, MD    First Contact: Dr. Drucie Opitz, MD Pager: [phone]  Second Contact: Dr. Hadassah Pais, MD Pager: [phone]       After Hours (After 5p/  First Contact Pager: [phone]  weekends / holidays): Second Contact Pager: [phone]   Chief Complaint: nausea, vomiting diarrhea  History of Present Illness:  [name] is a 81 year old person living with PMH HFpEF (last TTE 07/19/2021 EF 55%), CKD IIIb, CAD, SSS s/p pacemaker, PVD, paroxysmal atrial fibrillation on eliquis, OSA, CVA, GERD, HLD, HTN presented to [facility] ED for nausea, vomiting, and diarrhea for 1 day. Last night had about 5-6 episodes of non bilious, non bloody emesis as well as 5-6 episodes of diarrhea. This is associated with epigastric/LUQ pain. Unable to to characterize pain. Denies any sick contracts or suspicious food. Endorses feeling feverish with temp in 100s so she called EMS. Also endorses shortness of breath with excerebration. Worse last night with vomiting.    Patient with chronic leg wound for past 3-4 months likely due to chronic venous stasis. She follows with [facility] wound care clinic. Yesterday 8/9 has right leg biopsy with dermatology for further workup of these non healing LE wounds. Underwent punch biopsy on R leg to evaluate for venous statis vs pyoderma gangrenosum. States she started feeling worse after getting home from office visit. Denies chest pain, cough, palpitations, dizziness, or dysuria.  Meds:  Albuterol Allopurinol 100 mg daily Amiodarone 200 mg gdaily Apixaban 5 mg twice daily Voltaren gel Famotidine 40 mg daily Flonase  Imdur 30 mgd daily Metroprolol succinate 50 mg daily Nitroglycerin Oxybutynin 5 mg twice  daily  Percocet 5-325 q8 hr PRN pain Protonix 40 mg twice daily Potassium chloride 13mQ twice daily Rosuvastatin 10 mg daily Torsemide 40 mg daily Vitamin b-12  Allergies: Allergies as of 06/03/2022 - Review Complete 06/03/2022  Allergen Reaction Noted   Ampicillin Other (See Comments) 05/23/2011   Penicillins Other (See Comments) 05/23/2011   Linzess [linaclotide] Other (See Comments) 05/22/2020   Past Medical History:  Diagnosis Date   Arthritis    CHF ([place]estive heart failure) [place]    05/2011 echo severe concentric LVH, normal systolic fxn, grade 1 diastolic dysfxn, paradoxical septal motion   Chronic kidney disease-mineral and bone disorder 05/20/2020   Coronary artery disease    Cath 04/2006 normal coronaries, mild-mod slow flow in all coronaries with slow distall runoff, mild-mod LV enlargement, EF 55%    Depression    Dyspnea    Dysrhythmia    GERD (gastroesophageal reflux disease)    Hypercholesterolemia    Hyperlipemia    Hypertension    [place] infarction) ([place]    NOT SURE HOW MANY   Morbid obesity ([place]    Nocturia    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   [place]ipheral vascular [place]    Pneumonia NONE RECENT   Sleep apnea    needs cpap but does not wear   Stroke ([facility]    TIA (transient ischemic attack)    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 Ambulates with a walker. Denies tobacco, alcohol, or illicit substance use.   Review of Systems: A complete ROS was negative except as per HPI.   Physical Exam: Blood pressure (!) 121/49, pulse (!) 59, temperature (!) 100.7 F (38.2 C), temperature source Axillary, resp. rate 14, height '5\' 7"'  (1.702 m), weight (!) 149.7 kg, SpO2 99 %. Constitutional: Laying in bed sleeping, arousal to voice and poor attention, no  acute distress HENT: Normocephalic and atraumatic, EOMI, conjunctiva normal, moist mucous membranes Cardiovascular: regular rhythm and rate, no murmurs, difficult to assess JVD due to habitus, extremities warm, radial and dp pulses 2+ and symmetric, significant BLE edema up to sacrum Respiratory:  Effort is normal. Crackles bilaterally from mid lung fields to bases, no wheezing GI: obese, normally active bowel sounds, TTP of the LUQ and epigastric area Musculoskeletal: chronic venous changes of bilateral lower extremities Neurological:somnolent but easily arousal, poor attention, oriented to person, place, time, and situation, following commands Skin: 3x4 cm shallow ulceration of the R lateral lower extremity with dried serosanguinous drainage on dressing, granulation tissues at wound base with small amount of purulence, smaller ulceration of the left medial LE, see photos below Psychiatric: normal mood and affect            EKG: personally reviewed my interpretation is sinus rhythm with HR 69, LBBB, prolonged PR and QT  CXR: personally reviewed my interpretation is cardiomegaly, vascular congestion c/w pulmonary edema, bilateral small pleural effusions  CT abdomen w/o contrast No acute finding. Negative for bowel obstruction or visible inflammation.  Assessment & Plan by Problem: Active Problems:   Sepsis ([place])  Sepsis Patient presenting with LUQ pain with associated nausea, vomiting, and diarrhea. Found to be febrile to 101.8 in the ED. Hypotensive with MAPs in 60s improved with fluids. With lactic acid from 2.4>3. CT abdomen unremarkable. Lier enzymes, alk phos and bili unremarkable. UA not c/w infection. Patient with chronic LE wounds that appear erythematous  and warm could be a source of infection. No further episodes of emesis or diarrhea in ED, but could check GI panel this recours. Given 1L fluid bolus, continuous fluids, and antibiotics in ED.  -continue vanc and rocephin,  stop flagyl given unremarkable CT abdomen and reassuring CMP -Continue IV fluids - protnoix 40 mg daily - GI panel if having more diarrhea -repeat lactic acid -follow up blood cultures -monitor fever curve and CBC  AKI on CKD IIIb Baseline ~1.3-1.4. Elevated BUN 24 sCr 1.99. Suspect in setting of sepsis with poor PO intake with vomiting and diarrhea.  - IVF - Monitor BMP -I/Os - Avoid nephrotoxins   HFpEF CAD  HTN Last echo 06/2021 EF 55%, severe LVH, LA dilation. Reports increase LE swelling and DOE in last day. CXR with pulmonary congestion. BNP mildly elevated at 124. Received IV lasix 40 mg in ED. Then given fluids in ED in setting of sepsis,  also so mild hypotension with MAP of 63 earlier today. -hold torsemide, Imdur -continue statin  -strict I/Os, weights -monitor volume status  Chronic LE wounds  Sees wound care clinic at [facility]. Improved ulcerations from visit on 7/27. Wound likely due to chronic venous stasis. Did have bx with dermatology on 8/9 for suspicion of  PG. ABIs wnl 04/2022 -wound care  Paroxsymal atrial fibrillation No cest pain, EKG in sinus  rhythm.  -Continue home eliquis 5 mg twice daily and amiodarone 200 mg daily  - hold metoprolol succinate 50 mg daily in setting of sepsis and hypotension  Sick Sinus syndrome s/p pacemaker No chest pain or EKG changes, troponin wnl.  -continue home amiodarone  Mixed urinary incontinency History of overflow and stress incontinence.  -continue home oxybutynin  Dispo: Admit patient to Inpatient with expected length of stay greater than 2 midnights.  Signed: Iona Beard, MD 06/03/2022, 10:03 AM  Pager: [phone] After 5pm on weekdays and 1pm on weekends: On Call pager: [phone]

## 2022-06-03 NOTE — ED Notes (Signed)
Pt's granddaughter updated; pt speaking on phone with grandaughter

## 2022-06-03 NOTE — ED Triage Notes (Signed)
Pt BIB EMS from home. Pt has hx of CHF and cellulitis. After wound care appt today pt began have nausea, vomiting, and diarrhea - Pt also reported she had temp.  Pt has 20LAC - given 150 fluids and 4 zofran en route  VS with EMS  170/100 HR 64 RR 24  End tidal 40 CBG 178

## 2022-06-03 NOTE — Progress Notes (Signed)
Pharmacy Antibiotic Note  [name] is a 82 y.o. female admitted on 06/03/2022 presenting with cellulitis.  Pharmacy has been consulted for vancomycin dosing.  Plan: Vancomycin 2000 mg IV x 1, then 1250 mg IV q 36h (eAUC 501, SCr 2.1) Monitor renal function, clinical progression and LOT Vancomycin levels as indicated  Height: 5\' 7"  (170.2 cm) Weight: (!) 149.7 kg (330 lb) IBW/kg (Calculated) : 61.6  Temp (24hrs), Avg:100.4 F (38 C), Min:98.1 F (36.7 C), Max:101.8 F (38.8 C)  Recent Labs  Lab 06/03/22 0205 06/03/22 0220 06/03/22 0549  WBC 8.8  --   --   CREATININE 1.99* 2.10*  --   LATICACIDVEN 2.4*  --  3.0*    Estimated Creatinine Clearance: 33.7 mL/min (A) (by C-G formula based on SCr of 2.1 mg/dL (H)).    Allergies  Allergen Reactions   Ampicillin Other (See Comments)    Increases heart rhythm  Has tolerated ceftriaxone in the past - 06/2020   Penicillins Other (See Comments)    Increases heart rhythm    Linzess [Linaclotide] Other (See Comments)    Severe stomach pains   Bertis Ruddy, PharmD Clinical Pharmacist ED Pharmacist Phone # [phone] 06/03/2022 8:21 AM

## 2022-06-03 NOTE — ED Notes (Addendum)
Pt grand daughter given update with permission of patient - Per pt grand daughter pt is not allowed to have morphine d/t family thing - MD notified

## 2022-06-03 NOTE — ED Notes (Signed)
ED TO INPATIENT HANDOFF REPORT  ED Nurse Name and Phone #: [phone]  S Name/Age/Gender [name] 81 y.o. female Room/Bed: 046C/046C  Code Status   Code Status: Full Code  Home/SNF/Other Home Patient oriented to: self, place, time, and situation Is this baseline? Yes   Triage Complete: Triage complete  Chief Complaint Sepsis [facility]) [A41.9]  Triage Note Pt BIB EMS from home. Pt has hx of CHF and cellulitis. After wound care appt today pt began have nausea, vomiting, and diarrhea - Pt also reported she had temp.  Pt has 20LAC - given 150 fluids and 4 zofran en route  VS with EMS  170/100 HR 64 RR 24  End tidal 40 CBG 178   Allergies Allergies  Allergen Reactions   Ampicillin Other (See Comments)    Increases heart rhythm  Has tolerated ceftriaxone in the past - 06/2020   Penicillins Other (See Comments)    Increases heart rhythm    Linzess [Linaclotide] Other (See Comments)    Severe stomach pains    Level of Care/Admitting Diagnosis ED Disposition     ED Disposition  Admit   Condition  --   [place]: [facility] [[id number]]  Level of Care: Med-Surg [16]  May admit patient to [facility] or [facility] if equivalent level of care is available:: No  Covid Evaluation: Asymptomatic - no recent exposure (last 10 days) testing not required  Diagnosis: Sepsis [facility]) [[id number]]  Admitting Physician: Lottie Mussel [[id number]]  Attending Physician: Lottie Mussel [[id number]]  Certification:: I certify this patient will need inpatient services for at least 2 midnights  Estimated Length of Stay: 2          B Medical/Surgery History Past Medical History:  Diagnosis Date   Arthritis    CHF (congestive heart failure) ([place])    05/2011 echo severe concentric LVH, normal systolic fxn, grade 1 diastolic dysfxn, paradoxical septal motion   Chronic kidney disease-mineral and bone disorder 05/20/2020   Coronary artery disease    Cath  04/2006 normal coronaries, mild-mod slow flow in all coronaries with slow distall runoff, mild-mod LV enlargement, EF 55%    Depression    Dyspnea    Dysrhythmia    GERD (gastroesophageal reflux disease)    Hypercholesterolemia    Hyperlipemia    Hypertension    MI (myocardial infarction) ([place])    NOT SURE HOW MANY   Morbid obesity ([employer])    Nocturia    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   Peripheral vascular disease ([place])    Pneumonia NONE RECENT   Sleep apnea    needs cpap but does not wear   Stroke [facility])    TIA (transient ischemic attack)    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    Vertigo    takes meclizine daily   Past Surgical History:  Procedure Laterality Date   ABDOMINAL HYSTERECTOMY     CARDIAC CATHETERIZATION     CATARACT EXTRACTION W/PHACO Left 08/29/2013   Procedure: CATARACT EXTRACTION PHACO AND INTRAOCULAR LENS PLACEMENT ([place]);  Surgeon: Adonis Brook, MD;  Location: [place];  Service: Ophthalmology;  Laterality: Left;   COLONOSCOPY WITH PROPOFOL N/A 01/07/2014   Procedure: COLONOSCOPY WITH PROPOFOL;  Surgeon: Juanita Craver, MD;  Location: [facility] END[facility];  Service: Endoscopy;  Laterality: N/A;   CORONARY ANGIOGRAPHY N/A 05/27/2020   Procedure: CORONARY ANGIOGRAPHY;  Surgeon: Dixie Dials, MD;  Location: [place] CV LAB;  Service: Cardiovascular;  Laterality: N/A;   DILATION AND CURETTAGE OF UTERUS     INSERT / REPLACE / REMOVE PACEMAKER     LEFT HEART CATH AND CORONARY ANGIOGRAPHY N/A 07/18/2020   Procedure: LEFT HEART CATH AND CORONARY ANGIOGRAPHY;  Surgeon: Dixie Dials, MD;  Location: [place] CV LAB;  Service: Cardiovascular;  Laterality: N/A;   PACEMAKER GENERATOR CHANGE N/A 03/27/2014   Procedure: PACEMAKER GENERATOR CHANGE;  Surgeon: Sanda Klein, MD;  Location: [place] CATH LAB;  Service: Cardiovascular;  Laterality: N/A;   PACEMAKER INSERTION     PACEMAKER INSERTION     TUBAL LIGATION     VASCULAR SURGERY     vericose vein surgery      A IV Location/Drains/Wounds Patient Lines/Drains/Airways Status     Active Line/Drains/Airways     Name Placement date Placement time Site Days   Peripheral IV 06/03/22 20 G Left Antecubital 06/03/22  0142  Antecubital  less than 1   Peripheral IV 06/03/22 20 G Right Forearm 06/03/22  0205  Forearm  less than 1   External Urinary Catheter 06/03/22  0230  --  less than 1   Pressure Injury 09/19/20 Coccyx Stage 2 -  Partial thickness loss of dermis presenting as a shallow open injury with a red, pink wound bed without slough. slit measuring 1x1 cm 09/19/20  1803  -- 622   Wound / Incision (Open or Dehisced) 07/16/20 (MASD) Moisture Associated Skin Damage Pelvis Anterior;Right;Left 07/16/20  2056  Pelvis  687   Wound / Incision (Open or Dehisced) 07/16/20 (MASD) Moisture Associated Skin Damage Breast Left;Lower;Right;Bilateral 07/16/20  2056  Breast  687   Wound / Incision (Open or Dehisced) 09/19/20 (MASD) Moisture Associated Skin Damage Knee Left;Posterior 09/19/20  1800  Knee  622   Wound / Incision (Open or Dehisced) (MASD) Moisture Associated Skin Damage Left;Lower --  --  --  --            Intake/Output Last 24 hours  Intake/Output Summary (Last 24 hours) at 06/03/2022 1548 Last data filed at 06/03/2022 7408 Gross per 24 hour  Intake --  Output 250 ml  Net -250 ml    Labs/Imaging Results for orders placed or performed during the hospital encounter of 06/03/22 (from the past 48 hour(s))  Lactic acid, plasma     Status: Abnormal   Collection Time: 06/03/22  2:05 AM  Result Value Ref Range   Lactic Acid, Venous 2.4 (HH) 0.5 - 1.9 mmol/L    Comment: CRITICAL RESULT CALLED TO, READ BACK BY AND VERIFIED WITH Joneen Caraway, RN, 0301 06/03/22, Courtney Paris Performed at [facility], [place] [address], [place], [place] [address]   Comprehensive metabolic panel     Status: Abnormal   Collection Time: 06/03/22  2:05 AM  Result Value Ref Range   Sodium 141 135 - 145 mmol/L    Potassium 4.5 3.5 - 5.1 mmol/L   Chloride 106 98 - 111 mmol/L   CO2 26 22 - 32 mmol/L   Glucose, Bld 124 (H) 70 - 99 mg/dL    Comment: Glucose reference range applies only to samples taken after fasting for at least 8 hours.   BUN 24 (H) 8 - 23 mg/dL   Creatinine, Ser 1.99 (H) 0.44 - 1.00 mg/dL   Calcium 8.6 (L) 8.9 - 10.3 mg/dL   Total Protein 7.9 6.5 - 8.1 g/dL   Albumin 3.5 3.5 - 5.0 g/dL   AST 26 15 - 41 U/L   ALT 13 0 -  44 U/L   Alkaline Phosphatase 96 38 - 126 U/L   Total Bilirubin 0.7 0.3 - 1.2 mg/dL   GFR, Estimated 25 (L) >60 mL/min    Comment: (NOTE) Calculated using the CKD-EPI Creatinine Equation (2021)    Anion gap 9 5 - 15    Comment: Performed at [facility] [address], [place], [place] [address]  CBC with Differential     Status: Abnormal   Collection Time: 06/03/22  2:05 AM  Result Value Ref Range   WBC 8.8 4.0 - 10.5 K/uL   RBC 4.09 3.87 - 5.11 MIL/uL   Hemoglobin 13.4 12.0 - 15.0 g/dL   HCT 42.0 36.0 - 46.0 %   MCV 102.7 (H) 80.0 - 100.0 fL   MCH 32.8 26.0 - 34.0 pg   MCHC 31.9 30.0 - 36.0 g/dL   RDW 17.9 (H) 11.5 - 15.5 %   Platelets 131 (L) 150 - 400 K/uL    Comment: REPEATED TO VERIFY   nRBC 0.0 0.0 - 0.2 %   Neutrophils Relative % 80 %   Neutro Abs 7.1 1.7 - 7.7 K/uL   Lymphocytes Relative 12 %   Lymphs Abs 1.1 0.7 - 4.0 K/uL   Monocytes Relative 5 %   Monocytes Absolute 0.5 0.1 - 1.0 K/uL   Eosinophils Relative 1 %   Eosinophils Absolute 0.1 0.0 - 0.5 K/uL   Basophils Relative 1 %   Basophils Absolute 0.1 0.0 - 0.1 K/uL   Immature Granulocytes 1 %   Abs Immature Granulocytes 0.05 0.00 - 0.07 K/uL    Comment: Performed at [facility] [address], [place], [place] [address]  Lipase, blood     Status: None   Collection Time: 06/03/22  2:05 AM  Result Value Ref Range   Lipase 29 11 - 51 U/L    Comment: Performed at [facility] [address] [place], [place] [address]  Brain natriuretic peptide     Status:  Abnormal   Collection Time: 06/03/22  2:05 AM  Result Value Ref Range   B Natriuretic Peptide 124.3 (H) 0.0 - 100.0 pg/mL    Comment: Performed at [facility] [address] [place], [place] [address]  Troponin I (High Sensitivity)     Status: None   Collection Time: 06/03/22  2:05 AM  Result Value Ref Range   Troponin I (High Sensitivity) 10 <18 ng/L    Comment: (NOTE) Elevated high sensitivity troponin I (hsTnI) values and significant  changes across serial measurements may suggest ACS but many other  chronic and acute conditions are known to elevate hsTnI results.  Refer to the "Links" section for chest pain algorithms and additional  guidance. Performed at [facility], [place] [address], [place],  [address]   I-Stat Chem 8, ED     Status: Abnormal   Collection Time: 06/03/22  2:20 AM  Result Value Ref Range   Sodium 144 135 - 145 mmol/L   Potassium 4.3 3.5 - 5.1 mmol/L   Chloride 106 98 - 111 mmol/L   BUN 29 (H) 8 - 23 mg/dL   Creatinine, Ser 2.10 (H) 0.44 - 1.00 mg/dL   Glucose, Bld 122 (H) 70 - 99 mg/dL    Comment: Glucose reference range applies only to samples taken after fasting for at least 8 hours.   Calcium, Ion 1.08 (L) 1.15 - 1.40 mmol/L   TCO2 27 22 - 32 mmol/L  Hemoglobin 15.3 (H) 12.0 - 15.0 g/dL   HCT 45.0 36.0 - 46.0 %  Resp Panel by RT-PCR (Flu A&B, Covid) Anterior Nasal Swab     Status: None   Collection Time: 06/03/22  2:42 AM   Specimen: Anterior Nasal Swab  Result Value Ref Range   SARS Coronavirus 2 by RT PCR NEGATIVE NEGATIVE    Comment: (NOTE) SARS-CoV-2 target nucleic acids are NOT DETECTED.  The SARS-CoV-2 RNA is generally detectable in upper respiratory specimens during the acute phase of infection. The lowest concentration of SARS-CoV-2 viral copies this assay can detect is 138 copies/mL. A negative result does not preclude SARS-Cov-2 infection and should not be used as the sole basis for treatment or other patient  management decisions. A negative result may occur with  improper specimen collection/handling, submission of specimen other than nasopharyngeal swab, presence of viral mutation(s) within the areas targeted by this assay, and inadequate number of viral copies(<138 copies/mL). A negative result must be combined with clinical observations, patient history, and epidemiological information. The expected result is Negative.  Fact Sheet for Patients:  EntrepreneurPulse.com.au  Fact Sheet for Healthcare Providers:  IncredibleEmployment.be  This test is no t yet approved or cleared by the [place] FDA and  has been authorized for detection and/or diagnosis of SARS-CoV-2 by FDA under an Emergency Use Authorization (EUA). This EUA will remain  in effect (meaning this test can be used) for the duration of the COVID-19 declaration under Section 564(b)(1) of the Act, 21 U.S.C.section 360bbb-3(b)(1), unless the authorization is terminated  or revoked sooner.       Influenza A by PCR NEGATIVE NEGATIVE   Influenza B by PCR NEGATIVE NEGATIVE    Comment: (NOTE) The Xpert Xpress SARS-CoV-2/FLU/RSV plus assay is intended as an aid in the diagnosis of influenza from Nasopharyngeal swab specimens and should not be used as a sole basis for treatment. Nasal washings and aspirates are unacceptable for Xpert Xpress SARS-CoV-2/FLU/RSV testing.  Fact Sheet for Patients: EntrepreneurPulse.com.au  Fact Sheet for Healthcare Providers: IncredibleEmployment.be  This test is not yet approved or cleared by the [place] FDA and has been authorized for detection and/or diagnosis of SARS-CoV-2 by FDA under an Emergency Use Authorization (EUA). This EUA will remain in effect (meaning this test can be used) for the duration of the COVID-19 declaration under Section 564(b)(1) of the Act, 21 U.S.C. section 360bbb-3(b)(1), unless the  authorization is terminated or revoked.  Performed at [facility], [place] [address], [place], [place] [address]   Troponin I (High Sensitivity)     Status: None   Collection Time: 06/03/22  3:55 AM  Result Value Ref Range   Troponin I (High Sensitivity) 10 <18 ng/L    Comment: (NOTE) Elevated high sensitivity troponin I (hsTnI) values and significant  changes across serial measurements may suggest ACS but many other  chronic and acute conditions are known to elevate hsTnI results.  Refer to the "Links" section for chest pain algorithms and additional  guidance. Performed at [facility], [place] [address], [place], [place] [address]   Lactic acid, plasma     Status: Abnormal   Collection Time: 06/03/22  5:49 AM  Result Value Ref Range   Lactic Acid, Venous 3.0 (HH) 0.5 - 1.9 mmol/L    Comment: CRITICAL VALUE NOTED. VALUE IS CONSISTENT WITH PREVIOUSLY REPORTED/CALLED VALUE Performed at [facility], [place] [address] [place], [place] [address]   Urinalysis, Routine w reflex microscopic  Status: Abnormal   Collection Time: 06/03/22  6:30 AM  Result Value Ref Range   Color, Urine YELLOW YELLOW   APPearance CLEAR CLEAR   Specific Gravity, Urine 1.010 1.005 - 1.030   pH 6.0 5.0 - 8.0   Glucose, UA >=500 (A) NEGATIVE mg/dL   Hgb urine dipstick NEGATIVE NEGATIVE   Bilirubin Urine NEGATIVE NEGATIVE   Ketones, ur NEGATIVE NEGATIVE mg/dL   Protein, ur NEGATIVE NEGATIVE mg/dL   Nitrite NEGATIVE NEGATIVE   Leukocytes,Ua NEGATIVE NEGATIVE   RBC / HPF 0-5 0 - 5 RBC/hpf   WBC, UA 0-5 0 - 5 WBC/hpf   Bacteria, UA NONE SEEN NONE SEEN   Squamous Epithelial / LPF 0-5 0 - 5    Comment: Performed at [facility], [place] [address], [place], [place] [address]  Protime-INR     Status: None   Collection Time: 06/03/22  8:53 AM  Result Value Ref Range   Prothrombin Time 13.9 11.4 - 15.2 seconds   INR 1.1 0.8 - 1.2    Comment: (NOTE) INR goal varies based on device and  disease states. Performed at [facility], [place] [address], [place], [place] [address]   APTT     Status: None   Collection Time: 06/03/22  8:53 AM  Result Value Ref Range   aPTT 28 24 - 36 seconds    Comment: Performed at [facility] [address], [place], [place] [address]  Lactic acid, plasma     Status: Abnormal   Collection Time: 06/03/22  8:53 AM  Result Value Ref Range   Lactic Acid, Venous 2.2 (HH) 0.5 - 1.9 mmol/L    Comment: CRITICAL VALUE NOTED. VALUE IS CONSISTENT WITH PREVIOUSLY REPORTED/CALLED VALUE Performed at [facility], [place] [address], [place], [place] [address]    CT ABDOMEN PELVIS WO CONTRAST  Result Date: 06/03/2022 CLINICAL DATA:  Acute, nonlocalized abdominal pain. Nausea, vomiting, and diarrhea EXAM: CT ABDOMEN AND PELVIS WITHOUT CONTRAST TECHNIQUE: Multidetector CT imaging of the abdomen and pelvis was performed following the standard protocol without IV contrast. RADIATION DOSE REDUCTION: This exam was performed according to the departmental dose-optimization program which includes automated exposure control, adjustment of the mA and/or kV according to patient size and/or use of iterative reconstruction technique. COMPARISON:  07/16/2020 FINDINGS: Lower chest: Streaky density at the lung bases with volume loss-mild atelectasis. Cardiomegaly. Dual-chamber pacer with somewhat redundant right ventricular lead looping inferiorly. Hepatobiliary: No focal liver abnormality.High-density in the dependent gallbladder could be streak artifact. No definite cholelithiasis; no cholecystitis. Pancreas: Generalized atrophy without acute finding Spleen: Unremarkable. Adrenals/Urinary Tract: Negative adrenals. No hydronephrosis or stone. Partially covered right renal cystic intensity. Unremarkable bladder. Stomach/Bowel: Small sliding hiatal hernia. Extensive colonic diverticulosis. No bowel obstruction or visible inflammation, including of the appendix  Vascular/Lymphatic: No acute vascular abnormality. Mild for age atheromatous calcification. No mass or adenopathy. Reproductive:Hysterectomy Other: No ascites or pneumoperitoneum. Musculoskeletal: No acute abnormalities. Advanced lumbar spine degeneration. IMPRESSION: No acute finding. Negative for bowel obstruction or visible inflammation. Electronically Signed   By: Jorje Guild M.D.   On: 06/03/2022 06:10   DG Chest Port 1 View  Result Date: 06/03/2022 CLINICAL DATA:  Questionable sepsis - evaluate for abnormality Nausea, vomiting, diarrhea. EXAM: PORTABLE CHEST 1 VIEW COMPARISON:  Radiograph 08/17/2021 FINDINGS: Right-sided pacemaker in place. Cardiomegaly is stable. Unchanged mediastinal contours. There is vascular congestion. Interstitial thickening suggest pulmonary edema. Trace fluid in the right minor fissure. There may be small  pleural effusions, not well assessed on this portable AP view. No pneumothorax. IMPRESSION: Cardiomegaly with vascular congestion and interstitial thickening suspicious for pulmonary edema. Suspect small pleural effusions. Electronically Signed   By: Keith Rake M.D.   On: 06/03/2022 02:00    Pending Labs Unresulted Labs (From admission, onward)     Start     Ordered   06/04/22 3716  Basic metabolic panel  Tomorrow morning,   R        06/03/22 0850   06/04/22 0500  CBC  Tomorrow morning,   R        06/03/22 0850   06/03/22 0903  Hemoglobin A1c  Add-on,   AD        06/03/22 0902   06/03/22 0820  Lactic acid, plasma  STAT Now then every 3 hours,   R (with STAT occurrences)      06/03/22 0819   06/03/22 0138  Blood Culture (routine x 2)  (Undifferentiated presentation (screening labs and basic nursing orders))  BLOOD CULTURE X 2,   STAT      06/03/22 0138            Vitals/Pain Today's Vitals   06/03/22 1346 06/03/22 1347 06/03/22 1348 06/03/22 1507  BP: (!) 112/51     Pulse: 62  65   Resp: (!) 24  (!) 23   Temp:      TempSrc:      SpO2:   92%    Weight:      Height:      PainSc:  Asleep  Asleep    Isolation Precautions Airborne and Contact precautions  Medications Medications  0.9 %  sodium chloride infusion ( Intravenous New Bag/Given 06/03/22 1234)  acetaminophen (TYLENOL) tablet 650 mg (650 mg Oral Given 06/03/22 1238)  metroNIDAZOLE (FLAGYL) IVPB 500 mg (0 mg Intravenous Stopped 06/03/22 0910)  vancomycin (VANCOREADY) IVPB 1250 mg/250 mL (has no administration in time range)  acetaminophen (TYLENOL) suppository 650 mg (has no administration in time range)  albuterol (PROVENTIL) (2.5 MG/3ML) 0.083% nebulizer solution 2.5 mg (2.5 mg Inhalation Given 06/03/22 1350)  amiodarone (PACERONE) tablet 200 mg (200 mg Oral Given 06/03/22 1108)  apixaban (ELIQUIS) tablet 5 mg (5 mg Oral Given 06/03/22 1109)  rosuvastatin (CRESTOR) tablet 10 mg (10 mg Oral Given 06/03/22 1109)  cyanocobalamin (VITAMIN B12) tablet 1,000 mcg (1,000 mcg Oral Given 06/03/22 1110)  oxybutynin (DITROPAN) tablet 5 mg (5 mg Oral Not Given 06/03/22 1232)  pantoprazole (PROTONIX) EC tablet 40 mg (40 mg Oral Given 06/03/22 1110)  cefTRIAXone (ROCEPHIN) 2 g in sodium chloride 0.9 % 100 mL IVPB (has no administration in time range)  prochlorperazine (COMPAZINE) injection 10 mg (10 mg Intravenous Given 06/03/22 0216)  furosemide (LASIX) injection 40 mg (40 mg Intravenous Given 06/03/22 0340)  fentaNYL (SUBLIMAZE) injection 50 mcg (50 mcg Intravenous Given 06/03/22 0338)  sodium chloride 0.9 % bolus 500 mL (0 mLs Intravenous Stopped 06/03/22 0703)  cefTRIAXone (ROCEPHIN) 1 g in sodium chloride 0.9 % 100 mL IVPB (0 g Intravenous Stopped 06/03/22 0656)  sodium chloride 0.9 % bolus 500 mL (0 mLs Intravenous Stopped 06/03/22 1138)  vancomycin (VANCOREADY) IVPB 2000 mg/400 mL (0 mg Intravenous Stopped 06/03/22 1227)    Mobility walks with device Moderate fall risk   Focused Assessments    R Recommendations: See Admitting Provider Note  Report given to:   Additional Notes:

## 2022-06-03 NOTE — ED Notes (Signed)
Pt 2nd lactic collected but not showing in process - Lab called - Lab states they do not have lactic - Lactic to be drawn and sent at this time

## 2022-06-04 DIAGNOSIS — I2510 Atherosclerotic heart disease of native coronary artery without angina pectoris: Secondary | ICD-10-CM | POA: Diagnosis not present

## 2022-06-04 DIAGNOSIS — R1011 Right upper quadrant pain: Secondary | ICD-10-CM | POA: Diagnosis not present

## 2022-06-04 DIAGNOSIS — I5030 Unspecified diastolic (congestive) heart failure: Secondary | ICD-10-CM | POA: Diagnosis not present

## 2022-06-04 DIAGNOSIS — R197 Diarrhea, unspecified: Secondary | ICD-10-CM

## 2022-06-04 DIAGNOSIS — I130 Hypertensive heart and chronic kidney disease with heart failure and stage 1 through stage 4 chronic kidney disease, or unspecified chronic kidney disease: Secondary | ICD-10-CM | POA: Diagnosis not present

## 2022-06-04 DIAGNOSIS — L97911 Non-pressure chronic ulcer of unspecified part of right lower leg limited to breakdown of skin: Secondary | ICD-10-CM | POA: Insufficient documentation

## 2022-06-04 DIAGNOSIS — S81801A Unspecified open wound, right lower leg, initial encounter: Secondary | ICD-10-CM

## 2022-06-04 DIAGNOSIS — N1832 Chronic kidney disease, stage 3b: Secondary | ICD-10-CM

## 2022-06-04 LAB — TROPONIN I (HIGH SENSITIVITY)
Troponin I (High Sensitivity): 34 ng/L — ABNORMAL HIGH (ref <18)
Troponin I (High Sensitivity): 50 ng/L — ABNORMAL HIGH (ref <18)
Troponin I (High Sensitivity): 66 ng/L — ABNORMAL HIGH (ref <18)
Troponin I (High Sensitivity): 57 ng/L — ABNORMAL HIGH (ref <18)

## 2022-06-04 LAB — HEMOGLOBIN A1C
Hgb A1c MFr Bld: 5.8 % — ABNORMAL HIGH (ref 4.8–5.6)
Mean Plasma Glucose: 119.76 mg/dL

## 2022-06-04 LAB — BASIC METABOLIC PANEL
Anion gap: 5 (ref 5–15)
BUN: 23 mg/dL (ref 8–23)
CO2: 24 mmol/L (ref 22–32)
Calcium: 7.7 mg/dL — ABNORMAL LOW (ref 8.9–10.3)
Chloride: 111 mmol/L (ref 98–111)
Creatinine, Ser: 1.82 mg/dL — ABNORMAL HIGH (ref 0.44–1.00)
GFR, Estimated: 28 mL/min — ABNORMAL LOW (ref >60)
Glucose, Bld: 107 mg/dL — ABNORMAL HIGH (ref 70–99)
Potassium: 4 mmol/L (ref 3.5–5.1)
Sodium: 140 mmol/L (ref 135–145)

## 2022-06-04 LAB — CBC
HCT: 37.7 % (ref 36.0–46.0)
Hemoglobin: 12.1 g/dL (ref 12.0–15.0)
MCH: 33 pg (ref 26.0–34.0)
MCHC: 32.1 g/dL (ref 30.0–36.0)
MCV: 102.7 fL — ABNORMAL HIGH (ref 80.0–100.0)
Platelets: 115 10*3/uL — ABNORMAL LOW (ref 150–400)
RBC: 3.67 MIL/uL — ABNORMAL LOW (ref 3.87–5.11)
RDW: 18.3 % — ABNORMAL HIGH (ref 11.5–15.5)
WBC: 9.5 10*3/uL (ref 4.0–10.5)
nRBC: 0 % (ref 0.0–0.2)

## 2022-06-04 LAB — LACTIC ACID, PLASMA: Lactic Acid, Venous: 1.2 mmol/L (ref 0.5–1.9)

## 2022-06-04 MED ORDER — FUROSEMIDE 10 MG/ML IJ SOLN
80.0000 mg | Freq: Every day | INTRAMUSCULAR | Status: DC
  Administered 2022-06-04 – 2022-06-05 (×2): 80 mg via INTRAVENOUS
  Filled 2022-06-04 (×2): qty 8

## 2022-06-04 NOTE — Progress Notes (Addendum)
Subjective:   Summary: [name] is a 81 y.o. year old female currently admitted on the IMTS HD#1 for sepsis.  Overnight Events: Pt was complaining of dyspnea,  Lasix 60 was given, which helped resolve the symptoms.     The patient states that she had diarrhea yesterday but has not had a bowel movement this morning. She continues to complain of RUQ abdominal pain that is 6/10 in severity. She states that her abdominal pain is roughly unchanged from yesterday. Patient states that she has been experiencing palpitations this morning. Patient denies nausea and vomiting today. Patient states that her SOB has improved today.   Objective:  Vital signs in last 24 hours: Vitals:   06/04/22 0833 06/04/22 0840 06/04/22 0845 06/04/22 1200  BP:   (!) 111/45 128/66  Pulse: 68 70 71 64  Resp:   16 16  Temp:      TempSrc:      SpO2:  92% 90% 92%  Weight:      Height:       Supplemental O2: Nasal Cannula SpO2: 92 % O2 Flow Rate (L/min): 5 L/min   Physical Exam:  Constitutional: Obese woman laying in bed, appears to be diaphoretic  Cardiovascular: RRR, no murmurs, rubs or gallops Pulmonary/Chest: Crackled on bilateral lower lobes  Abdominal: Tender  Skin: warm and dry Extremities: Bilateral LE edema  Filed Weights   06/03/22 0146  Weight: (!) 149.7 kg     Intake/Output Summary (Last 24 hours) at 06/04/2022 1600 Last data filed at 06/04/2022 1400 Gross per 24 hour  Intake 66.88 ml  Output 2850 ml  Net -2783.12 ml   Net IO Since Admission: -3,033.12 mL [06/04/22 1600]  Pertinent Labs:    Latest Ref Rng & Units 06/04/2022   12:37 AM 06/03/2022    2:20 AM 06/03/2022    2:05 AM  CBC  WBC 4.0 - 10.5 K/uL 9.5   8.8   Hemoglobin 12.0 - 15.0 g/dL 12.1  15.3  13.4   Hematocrit 36.0 - 46.0 % 37.7  45.0  42.0   Platelets 150 - 400 K/uL 115   131        Latest Ref Rng & Units 06/04/2022   12:37 AM 06/03/2022    2:20 AM 06/03/2022    2:05 AM  CMP   Glucose 70 - 99 mg/dL 107  122  124   BUN 8 - 23 mg/dL 23  29  24    Creatinine 0.44 - 1.00 mg/dL 1.82  2.10  1.99   Sodium 135 - 145 mmol/L 140  144  141   Potassium 3.5 - 5.1 mmol/L 4.0  4.3  4.5   Chloride 98 - 111 mmol/L 111  106  106   CO2 22 - 32 mmol/L 24   26   Calcium 8.9 - 10.3 mg/dL 7.7   8.6   Total Protein 6.5 - 8.1 g/dL   7.9   Total Bilirubin 0.3 - 1.2 mg/dL   0.7   Alkaline Phos 38 - 126 U/L   96   AST 15 - 41 U/L   26   ALT 0 - 44 U/L   13      Imaging: DG CHEST PORT 1 VIEW  Result Date: 06/03/2022 CLINICAL DATA:  Dyspnea. EXAM: PORTABLE CHEST 1 VIEW COMPARISON:  One-view chest x-ray 06/03/2022 FINDINGS: The heart is enlarged. Diffuse interstitial pattern has increased.  External leads obscure the right hemidiaphragm. Pacing wires are stable. IMPRESSION: Cardiomegaly with increasing interstitial edema compatible with congestive heart failure. Electronically Signed   By: San Morelle M.D.   On: 06/03/2022 20:28    Assessment/Plan:   Active Problems:   Acute on chronic heart failure with preserved ejection fraction ([place])   Sepsis ([place])   Open wound of right lower leg   Diarrhea of presumed infectious origin   Patient Summary: [name] is a 81 y.o. 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    #Sepsis (resolved) Pt's vitals have held steady within the past 24 hours. GI Panel was negative, and patient was not feverish. She is currently on IV Vanc and Ceftriaxone. CT of the abdomen shows no abnormalities. Lactate level has come down from 2.2 to 1.2 this AM. UA was unremarkable, and blood cultures were negative. C-Dif and GI Panel are still pending. She is still complaining of abdominal pain that is 6/10.   Plan:  - Stopped Vanc and Ceftriaxone due to lack of infectious signs  - Currently treating symptomatically   #HFpEF,  CAD, HTN Pt's last EF was 55% in 9/22. When she arrived to the emergency room, 2L of fluids were given to her for volume replenishment as she was complaining of vomiting and diarrhea. Over night, she developed dyspnea and her oxygen was increased to 5L, then slowly weaned back down to 2L. Chest X-Ray was done which showed a clear increase in fluid, making the possibly of over-diuresing her more likely. She was given Lasix 60mg  IV and started to feel better.   On examination today, pt complaining of chest pain radiating underneath her left breast, as well as her stomach, as well as dyspnea. Nurses had to up her oxygen to 5L again. EKG and Trop level was done which were unremarkable.   Plan:  -  Continue diuresis with Lasix 80 mg - Monitor output, currently at -2350  Diet: Heart Healthy Code: Full    Dispo: Anticipated discharge in more than 2 midnights  Drucie Opitz, MD PGY-1 Internal Medicine Resident Pager Number [phone] Please contact the on call pager after 5 pm and on weekends at [phone].

## 2022-06-04 NOTE — Progress Notes (Signed)
  Transition of Care [facility]) Screening Note   Patient Details  Name: [name] Date of Birth: Jan 17, 1941   Transition of Care [facility]) CM/SW Contact:    Cyndi Bender, RN Phone Number: 06/04/2022, 3:15 PM    Transition of Care [facility]) has reviewed patient and no TOC needs have been identified at this time. We will continue to monitor patient advancement through interdisciplinary progression rounds. If new patient transition needs arise, please place a TOC consult.

## 2022-06-04 NOTE — Hospital Course (Addendum)
8/11: The patient states that she had diarrhea yesterday but has not had a bowel movement this morning. She continues to complain of RUQ abdominal pain that is 6/10 in severity. She states that her abdominal pain is roughly unchanged from yesterday. Patient states that she has been experiencing palpitations this morning. Patient denies nausea and vomiting today. Patient states that her SOB has improved today. Discussed continuing lasix to diurese her. Discussed that her blood cultures are negative and that we are less concerned for an infection. Discussed discontinuing antibiotics today.  PE: crackles in bilateral bases, normal heart sounds, tenderness to palpation of the RUQ, LE are warm, bilateral LE edema, darkening of LE bilaterally  ------------------ 8/12 Reports feeling better. Reports 2 BM, one small and large. First was solid and the other was diarrhea. Felt nauseus earlier in morning. Breathing feels better. Endorses SOB yesterday and last night. Denies CP. +cough with mildly productive. Reports fever yesterday. Denies using CPAP at home. Patient's bed was adjusted.   PE: diffuse epigastric tenderness.  ----------------- 8/13: Patient is complaining of 30 minutes of central and left-sided chest pain. She states that the pain worsens with breathing and when pressing on her chest. Patient is also complaining of nausea and vomiting. Patient states that she had an episode of loose stool yesterday but has not had a bowel movement this morning.   08/14: Pt is doing a little better. She has been able to get up and go to the bathroom in which she had a non bloody BM. She reports only peeing a little bit. She denies any abdominal pain. She reports some improvement in her breathing, Currently on 7L [place] and hanging out in the low 90s% and improves with deep breathing. She endorses some chest tightness. She feels the most swollen in her legs and does not feel like they are getting lighter. She lives alone  in her house.   Rales on exam   8/15 Patient is seen outside bed, seated. Endorses mild nausea. Last bowel movement yesterday. Chest pain when she coughs. Productive sputum, yellow tinged. Reports her breathing has improved. Discussed goals of care. Reports grandson will come live with her at her home, he is 79 yo. Discussed discharge to SNF, patient does not like the idea of staying there. Is more comfortable with PT/OT at home. Has bedside commode, walker, and hosp bed. Patient is SPO2 96% on room air.   States grandson will stay with her. He works from 7am-5pm. She has an Engineer, production who comes from 8am-12pm. Has a daughter who will come home at [place] after work.   PE:   300 cc of undigested food, no blood  Puriwck doesntwork when vomit   Hospital Course:    #Iatrogenic Volume Overload due to IVF Resuscitation of hypotensive hypovolemia resulting from viral GE in setting of underlying CHF  Ms. Mcphee presented to the Emergency Department on 8/10 for a one day history of LUQ pain, nausea, vomiting, and diarrhea. She had underwent a punch biopsy on the R leg to evaluate for venous stasis vs pyoderma gangrenosum, and started to feel worse after getting home from the office visit. She was hypotensive, with MAPs in the 60s which improved with fluids. Lactic acid at the time was 3. She was started on vanc and rocephin, and CT of abdomen was unremarkable.   Fluid resuscitation was underway, however pt started developing shortness of breath, most likely due to over-correction of hypovolemia. She was requiring 7L of oxygen to sat at  95%. She was then started on Lasix 80 mg BID IV, and her BMP was consistently monitored. Echo revealed EF of 55%. I's and O's were monitored consistently as well, for goal of -2L everyday. SOB and oxygen requirements resolved, and she was then switched to her home dose of torsemide 40mg  PO.    #Acute on Chronic Post-Prandial Vomiting Emesis with No Alarming Features   Pt  reportedly had a month history of Post-Prandial vomiting. Initially, the team thought this was due to viral gastroenteritis, however due to the longevity of the emesis, we looked for alternative causes. GI was consulted and recommended an EGD. EGD revealed esophagitis and erosive gastropathy throughout the gastric antrum. GI recommended the continuance of Pantoprazole 40mg  BID, and added Famotidine 20mg  BID. EGD did not provide reasonable explanation for Post-Prandial vomiting, and gastric emptying study was attempted next. However, pt did not meet weight requirements. GI was reached out to again, however they recommended symptomatic treatment with Reglan 5mg  before meals.    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 She sees wound care clinic at [facility]. 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 She continuously had pain in her legs, and wound care has been re-wrapping her legs consistently throughout her course in the hospital. Wounds have been bleeding, and pt was started on a 5 day course of doxycycline 100 mg.

## 2022-06-04 NOTE — Evaluation (Signed)
Physical Therapy Evaluation Patient Details Name: [name] MRN: [id number] DOB: [birth year] Today's Date: 06/04/2022  History of Present Illness  Pt is a 81 year old woman admitted from home on 06/03/22 with abdominal pain, n/v/d, sepsis. PMH: dCHF, CKD IIb, PAF on Eliquis, SSS s/p pacemaker, HTN, CAD, HLD, CVA, chronic LE wounds, depression, obesity.  Clinical Impression   Pt presents with chest discomfort intermittently during session (RN aware, EKG in room at end of session), anxiety about mobility, weakness, impaired balance, and decreased activity tolerance vs basleine. Pt to benefit from acute PT to address deficits. Pt tolerated stand pivot to/from chair x2 during session, overall requiring mod +2 to mobilize when at baseline pt mobilizes without physical assist. Pt may need ST-SNF level of care post-acutely, pt states she wants to d/c home and if she does will need max HH services. PT to progress mobility as tolerated, and will continue to follow acutely.         Recommendations for follow up therapy are one component of a multi-disciplinary discharge planning process, led by the attending physician.  Recommendations may be updated based on patient status, additional functional criteria and insurance authorization.  Follow Up Recommendations Skilled nursing-short term rehab (<3 hours/day) (hopeful to d/c home with HHPT and family/aide assist) Can patient physically be transported by private vehicle: No    Assistance Recommended at Discharge Frequent or constant Supervision/Assistance  Patient can return home with the following  A lot of help with walking and/or transfers;A lot of help with bathing/dressing/bathroom    Equipment Recommendations None recommended by PT  Recommendations for Other Services       Functional Status Assessment Patient has had a recent decline in their functional status and demonstrates the ability to make significant improvements in function in  a reasonable and predictable amount of time.     Precautions / Restrictions Precautions Precautions: Fall Precaution Comments: contact, enteric Restrictions Weight Bearing Restrictions: No      Mobility  Bed Mobility Overal bed mobility: Needs Assistance Bed Mobility: Supine to Sit, Sit to Supine     Supine to sit: +2 for physical assistance, Mod assist Sit to supine: +2 for physical assistance, Mod assist   General bed mobility comments: increased time, assist for LEs over EOB, to raise trunk and position hips at EOB, assist to guide trunk and for LEs back into bed    Transfers Overall transfer level: Needs assistance Equipment used: Rolling walker (2 wheels) Transfers: Sit to/from Stand, Bed to chair/wheelchair/BSC Sit to Stand: +2 physical assistance, Mod assist, Min assist   Step pivot transfers: +2 physical assistance, Min assist       General transfer comment: use of momentum, +2 mod from recliner, +2 min from elevated bed, assist to rise and steady as pt moved hands from sitting surface to walker, decreased control of descent to bed    Ambulation/Gait                  Stairs            Wheelchair Mobility    Modified Rankin (Stroke Patients Only)       Balance Overall balance assessment: Needs assistance Sitting-balance support: Feet supported Sitting balance-Leahy Scale: Fair     Standing balance support: Bilateral upper extremity supported Standing balance-Leahy Scale: Poor  Pertinent Vitals/Pain Pain Assessment Pain Assessment: Faces Faces Pain Scale: Hurts little more Pain Location: chest, B LEs Pain Descriptors / Indicators: Discomfort Pain Intervention(s): Limited activity within patient's tolerance, Repositioned, Monitored during session    [place] expects to be discharged to:: Private residence Living Arrangements: Alone Available Help at Discharge:  Family;Personal care attendant;Available PRN/intermittently (aide 5 days a week) Type of Home: House Home Access: Stairs to enter       Home Layout: One level Home Equipment: Rollator (4 wheels);Hospital bed;Tub bench;Hand held shower head Additional Comments: HHA 5x per week 1.5 hours at a time    Prior Function Prior Level of Function : Needs assist             Mobility Comments: walks with rollator ADLs Comments: aide assists with bathing, dressing, meal prep and housekeeping, family helps with meals on the weekend     Hand Dominance   Dominant Hand: Right    Extremity/Trunk Assessment   Upper Extremity Assessment Upper Extremity Assessment: Defer to OT evaluation    Lower Extremity Assessment Lower Extremity Assessment: Generalized weakness    Cervical / Trunk Assessment Cervical / Trunk Assessment: Other exceptions Cervical / Trunk Exceptions: obesity  Communication   Communication: No difficulties  Cognition Arousal/Alertness: Awake/alert Behavior During Therapy: Anxious Overall Cognitive Status: Within Functional Limits for tasks assessed                                          General Comments General comments (skin integrity, edema, etc.): SpO2 88% and greater when accurate on 5LO2; bilat LEs wrapped given LE wounds. This PT pulled R peripheral IV out during session, safetyzone completed and RN notified.    Exercises     Assessment/Plan    PT Assessment Patient needs continued PT services  PT Problem List Decreased strength;Decreased mobility;Decreased safety awareness;Decreased activity tolerance;Decreased balance;Decreased knowledge of use of DME;Pain;Cardiopulmonary status limiting activity;Obesity       PT Treatment Interventions Therapeutic activities;DME instruction;Gait training;Therapeutic exercise;Patient/family education;Balance training;Functional mobility training;Neuromuscular re-education;Stair training    PT Goals  (Current goals can be found in the Care Plan section)  Acute Rehab PT Goals Patient Stated Goal: home PT Goal Formulation: With patient Time For Goal Achievement: 06/18/22 Potential to Achieve Goals: Good    Frequency Min 3X/week     Co-evaluation PT/OT/SLP Co-Evaluation/Treatment: Yes Reason for Co-Treatment: For patient/therapist safety;To address functional/ADL transfers PT goals addressed during session: Mobility/safety with mobility;Balance OT goals addressed during session: ADL's and self-care       AM-PAC PT "6 Clicks" Mobility  Outcome Measure Help needed turning from your back to your side while in a flat bed without using bedrails?: A Lot Help needed moving from lying on your back to sitting on the side of a flat bed without using bedrails?: A Lot Help needed moving to and from a bed to a chair (including a wheelchair)?: A Lot Help needed standing up from a chair using your arms (e.g., wheelchair or bedside chair)?: A Lot Help needed to walk in hospital room?: Total Help needed climbing 3-5 steps with a railing? : Total 6 Click Score: 10    End of Session   Activity Tolerance: Patient tolerated treatment well;Patient limited by fatigue Patient left: in bed;with call bell/phone within reach;with bed alarm set;with family/visitor present;with nursing/sitter in room;Other (comment) (returned to bed for EKG) Nurse Communication: Mobility status  PT Visit Diagnosis: Other abnormalities of gait and mobility (R26.89);Muscle weakness (generalized) (M62.81)    Time: 3014-1597 PT Time Calculation (min) (ACUTE ONLY): 37 min   Charges:   PT Evaluation $PT Eval Low Complexity: 1 Low         Tallen Schnorr S, PT DPT Acute Rehabilitation Services Pager [phone]  Office [phone]   Roxine Caddy E Ruffin Pyo 06/04/2022, 3:54 PM

## 2022-06-04 NOTE — Evaluation (Signed)
Occupational Therapy Evaluation Patient Details Name: [name] MRN: [id number] DOB: 19-Oct-1941 Today's Date: 06/04/2022   History of Present Illness Pt is a 81 year old woman admitted from home on 06/03/22 with abdominal pain, n/v/d, sepsis. PMH: dCHF, CKD IIb, PAF on Eliquis, SSS s/p pacemaker, HTN, CAD, HLD, CVA, chronic LE wounds, depression, obesity.   Clinical Impression   Pt was living alone with daily visits by an aide and PRN by her family. She walks with a rollator and is assisted for bathing, dressing, housekeeping and meal prep. Pt presents with LE and chest pain, decreased activity tolerance, generalized weakness and impaired standing balance. She currently requires +2 assist for all mobility and is only able to tolerate transfers. She requires up to total assist for ADLs. Pt with Sp03>90% with good pleth on 5L 02, pt is not typically 02 dependent, HR and BP WNL. Currently recommending further rehab in SNF, pt strongly desires to return home.      Recommendations for follow up therapy are one component of a multi-disciplinary discharge planning process, led by the attending physician.  Recommendations may be updated based on patient status, additional functional criteria and insurance authorization.   Follow Up Recommendations  Skilled nursing-short term rehab (<3 hours/day)    Assistance Recommended at Discharge Frequent or constant Supervision/Assistance  Patient can return home with the following Two people to help with walking and/or transfers;A lot of help with bathing/dressing/bathroom;Assistance with cooking/housework;Direct supervision/assist for financial management;Assist for transportation;Help with stairs or ramp for entrance    Functional Status Assessment  Patient has had a recent decline in their functional status and/or demonstrates limited ability to make significant improvements in function in a reasonable and predictable amount of time  Equipment  Recommendations  BSC/3in1;Wheelchair (measurements OT);Wheelchair cushion (measurements OT)    Recommendations for Other Services       Precautions / Restrictions Precautions Precautions: Fall Precaution Comments: contact, enteric Restrictions Weight Bearing Restrictions: No      Mobility Bed Mobility Overal bed mobility: Needs Assistance Bed Mobility: Supine to Sit, Sit to Supine     Supine to sit: +2 for physical assistance, Mod assist Sit to supine: +2 for physical assistance, Mod assist   General bed mobility comments: increased time, assist for LEs over EOB, to raise trunk and position hips at EOB, assist to guide trunk and for LEs back into bed    Transfers Overall transfer level: Needs assistance Equipment used: Rolling walker (2 wheels) Transfers: Sit to/from Stand, Bed to chair/wheelchair/BSC Sit to Stand: +2 physical assistance, Mod assist, Min assist     Step pivot transfers: +2 physical assistance, Min assist     General transfer comment: use of momentum, +2 mod from recliner, +2 min from elevated bed, assist to rise and steady as pt moved hands from sitting surface to walker, decreased control of descent to bed      Balance Overall balance assessment: Needs assistance Sitting-balance support: Feet supported Sitting balance-Leahy Scale: Fair     Standing balance support: Bilateral upper extremity supported Standing balance-Leahy Scale: Poor                             ADL either performed or assessed with clinical judgement   ADL Overall ADL's : Needs assistance/impaired Eating/Feeding: Independent;Sitting   Grooming: Set up;Sitting   Upper Body Bathing: Moderate assistance;Sitting   Lower Body Bathing: Total assistance;+2 for physical assistance;Sitting/lateral leans;Sit to/from stand   Upper  Body Dressing : Minimal assistance;Sitting   Lower Body Dressing: +2 for physical assistance;Total assistance;Sit to/from stand;Bed level    Toilet Transfer: +2 for physical assistance;Minimal assistance;Stand-pivot;Rolling walker (2 wheels) Toilet Transfer Details (indicate cue type and reason): simulated to chair Toileting- Clothing Manipulation and Hygiene: Total assistance;Sit to/from stand               Vision Ability to See in Adequate Light: 0 Adequate Patient Visual Report: No change from baseline       Perception     Praxis      Pertinent Vitals/Pain Pain Assessment Pain Assessment: Faces Faces Pain Scale: Hurts little more Pain Location: chest, B LEs Pain Descriptors / Indicators: Discomfort Pain Intervention(s): Monitored during session     Hand Dominance Right   Extremity/Trunk Assessment Upper Extremity Assessment Upper Extremity Assessment: Generalized weakness;Overall [facility] for tasks assessed   Lower Extremity Assessment Lower Extremity Assessment: Defer to PT evaluation   Cervical / Trunk Assessment Cervical / Trunk Assessment: Other exceptions Cervical / Trunk Exceptions: morbid obesity   Communication Communication Communication: No difficulties   Cognition Arousal/Alertness: Awake/alert Behavior During Therapy: Anxious Overall Cognitive Status: Within Functional Limits for tasks assessed                                       General Comments       Exercises     Shoulder Instructions      Home Living Family/patient expects to be discharged to:: Private residence Living Arrangements: Alone Available Help at Discharge: Family;Personal care attendant;Available PRN/intermittently (aide 5 days a week) Type of Home: House Home Access: Stairs to enter     Home Layout: One level     Bathroom Shower/Tub: Tub/shower unit;Curtain   Bathroom Toilet: Handicapped height     Home Equipment: Rollator (4 wheels);Hospital bed;Tub bench;Hand held shower head          Prior Functioning/Environment Prior Level of Function : Needs assist             Mobility  Comments: walks with rollator ADLs Comments: aide assists with bathing, dressing, meal prep and housekeeping, family helps with meals on the weekend        OT Problem List: Decreased strength;Decreased activity tolerance;Impaired balance (sitting and/or standing);Decreased knowledge of use of DME or AE;Obesity;Pain      OT Treatment/Interventions: Self-care/ADL training;Energy conservation;DME and/or AE instruction;Therapeutic activities;Patient/family education;Balance training    OT Goals(Current goals can be found in the care plan section) Acute Rehab OT Goals OT Goal Formulation: With patient Time For Goal Achievement: 06/18/22 Potential to Achieve Goals: Good ADL Goals Pt Will Perform Grooming: with min assist;standing Pt Will Perform Upper Body Bathing: with min assist;sitting Pt Will Perform Upper Body Dressing: with set-up;sitting Pt Will Transfer to Toilet: with min assist;ambulating;bedside commode Pt Will Perform Toileting - Clothing Manipulation and hygiene: with min assist;sit to/from stand Additional ADL Goal #1: pt will perform bed mobility with min assist in preparation for ADLs. Additional ADL Goal #2: Pt will state at least 3 energy conservation strategies as instructed.  OT Frequency: Min 2X/week    Co-evaluation PT/OT/SLP Co-Evaluation/Treatment: Yes Reason for Co-Treatment: For patient/therapist safety   OT goals addressed during session: ADL's and self-care      AM-PAC OT "6 Clicks" Daily Activity     Outcome Measure Help from another person eating meals?: None Help from another person taking care of  personal grooming?: A Little Help from another person toileting, which includes using toliet, bedpan, or urinal?: Total Help from another person bathing (including washing, rinsing, drying)?: A Lot Help from another person to put on and taking off regular upper body clothing?: A Little Help from another person to put on and taking off regular lower body  clothing?: Total 6 Click Score: 14   End of Session Equipment Utilized During Treatment: Rolling walker (2 wheels);Gait belt;Oxygen (5L) Nurse Communication: Mobility status  Activity Tolerance: Patient limited by fatigue Patient left: in bed;with call bell/phone within reach;with nursing/sitter in room (returned to bed for EKG)  OT Visit Diagnosis: Unsteadiness on feet (R26.81);Other abnormalities of gait and mobility (R26.89);Muscle weakness (generalized) (M62.81);Pain                Time: 7127-8718 OT Time Calculation (min): 30 min Charges:  OT General Charges $OT Visit: 1 Visit OT Evaluation $OT Eval Moderate Complexity: [place], OTR/L Acute Rehabilitation Services Office: [phone]   [name] 06/04/2022, 3:27 PM

## 2022-06-05 ENCOUNTER — Inpatient Hospital Stay (HOSPITAL_COMMUNITY): Payer: Medicare Other

## 2022-06-05 DIAGNOSIS — J9601 Acute respiratory failure with hypoxia: Secondary | ICD-10-CM | POA: Diagnosis not present

## 2022-06-05 DIAGNOSIS — I130 Hypertensive heart and chronic kidney disease with heart failure and stage 1 through stage 4 chronic kidney disease, or unspecified chronic kidney disease: Secondary | ICD-10-CM | POA: Diagnosis not present

## 2022-06-05 DIAGNOSIS — I5030 Unspecified diastolic (congestive) heart failure: Secondary | ICD-10-CM | POA: Diagnosis not present

## 2022-06-05 DIAGNOSIS — I2510 Atherosclerotic heart disease of native coronary artery without angina pectoris: Secondary | ICD-10-CM | POA: Diagnosis not present

## 2022-06-05 LAB — CBC
HCT: 40.1 % (ref 36.0–46.0)
Hemoglobin: 13 g/dL (ref 12.0–15.0)
MCH: 32.7 pg (ref 26.0–34.0)
MCHC: 32.4 g/dL (ref 30.0–36.0)
MCV: 100.8 fL — ABNORMAL HIGH (ref 80.0–100.0)
Platelets: 120 10*3/uL — ABNORMAL LOW (ref 150–400)
RBC: 3.98 MIL/uL (ref 3.87–5.11)
RDW: 17.6 % — ABNORMAL HIGH (ref 11.5–15.5)
WBC: 7.8 10*3/uL (ref 4.0–10.5)
nRBC: 0 % (ref 0.0–0.2)

## 2022-06-05 LAB — BASIC METABOLIC PANEL
Anion gap: 8 (ref 5–15)
BUN: 19 mg/dL (ref 8–23)
CO2: 30 mmol/L (ref 22–32)
Calcium: 8.4 mg/dL — ABNORMAL LOW (ref 8.9–10.3)
Chloride: 104 mmol/L (ref 98–111)
Creatinine, Ser: 1.84 mg/dL — ABNORMAL HIGH (ref 0.44–1.00)
GFR, Estimated: 28 mL/min — ABNORMAL LOW (ref >60)
Glucose, Bld: 129 mg/dL — ABNORMAL HIGH (ref 70–99)
Potassium: 3.7 mmol/L (ref 3.5–5.1)
Sodium: 142 mmol/L (ref 135–145)

## 2022-06-05 MED ORDER — ONDANSETRON 4 MG PO TBDP
4.0000 mg | ORAL_TABLET | Freq: Once | ORAL | Status: AC
  Administered 2022-06-05: 4 mg via ORAL
  Filled 2022-06-05: qty 1

## 2022-06-05 MED ORDER — FUROSEMIDE 10 MG/ML IJ SOLN: 80.0000 mg | Freq: Once | INTRAMUSCULAR | Status: DC

## 2022-06-05 NOTE — Progress Notes (Signed)
Paged by RN that patient was having facial pain. Evaluated at bedside, patient notes feeling warm with facial pain. Also feels her heart racing. Vital signs stable, BP 129/62, HR 92 BPM, SPO2 90% on 5L which is unchanged from last day. EKG without acute changes. Tylenol given. Will continue to monitor.

## 2022-06-05 NOTE — Progress Notes (Signed)
Pt was complaining of headache and increased heart beat.Vitals were BP 129/62, HR 92 SPO2 90% on 5L.Informed doctor.Ordered for EKG

## 2022-06-05 NOTE — Plan of Care (Signed)

## 2022-06-05 NOTE — Progress Notes (Signed)
Subjective:   Summary: [name] is a 81 y.o. year old female currently admitted on the IMTS HD#2 for sepsis.  Overnight Events: NOE   Pt states she had two bowel movements, half solid half diarrhea. She says she felt nauseous in the morning after eating breakfast, but that resolved. Endorses shortness of breath and no chest pain, and reports feeling feverish yesterday.    Objective:  Vital signs in last 24 hours: Vitals:   06/05/22 0200 06/05/22 0400 06/05/22 0444 06/05/22 0839  BP: 124/60 132/71 132/71 (!) 153/80  Pulse: 82 83 81 74  Resp: 18 18 18 16   Temp: 98.3 F (36.8 C) 98.2 F (36.8 C) 99.1 F (37.3 C) 99 F (37.2 C)  TempSrc: Oral Oral Oral Oral  SpO2: 94% 93% 95% 96%  Weight:      Height:       Supplemental O2: Nasal Cannula SpO2: 96 % O2 Flow Rate (L/min): 5 L/min   Physical Exam:  Constitutional: Obese woman laying in bed, appears to be diaphoretic Cardiovascular: RRR, no murmurs, rubs or gallops Pulmonary/Chest: Crackles have improved, but still persist in lower lobes Abdominal: soft, tender in epigastric region Skin: warm and dry Extremities: Bilateral LE Edema +2 w/ chronic venous ulcers   Filed Weights   06/03/22 0146  Weight: (!) 149.7 kg     Intake/Output Summary (Last 24 hours) at 06/05/2022 1140 Last data filed at 06/04/2022 1813 Gross per 24 hour  Intake 51.16 ml  Output 2300 ml  Net -2248.84 ml   Net IO Since Admission: -4,081.96 mL [06/05/22 1140]  Pertinent Labs:    Latest Ref Rng & Units 06/04/2022   12:37 AM 06/03/2022    2:20 AM 06/03/2022    2:05 AM  CBC  WBC 4.0 - 10.5 K/uL 9.5   8.8   Hemoglobin 12.0 - 15.0 g/dL 12.1  15.3  13.4   Hematocrit 36.0 - 46.0 % 37.7  45.0  42.0   Platelets 150 - 400 K/uL 115   131        Latest Ref Rng & Units 06/04/2022   12:37 AM 06/03/2022    2:20 AM 06/03/2022    2:05 AM  CMP  Glucose 70 - 99 mg/dL 107  122  124   BUN 8 - 23 mg/dL 23  29  24    Creatinine  0.44 - 1.00 mg/dL 1.82  2.10  1.99   Sodium 135 - 145 mmol/L 140  144  141   Potassium 3.5 - 5.1 mmol/L 4.0  4.3  4.5   Chloride 98 - 111 mmol/L 111  106  106   CO2 22 - 32 mmol/L 24   26   Calcium 8.9 - 10.3 mg/dL 7.7   8.6   Total Protein 6.5 - 8.1 g/dL   7.9   Total Bilirubin 0.3 - 1.2 mg/dL   0.7   Alkaline Phos 38 - 126 U/L   96   AST 15 - 41 U/L   26   ALT 0 - 44 U/L   13     Assessment/Plan:   Active Problems:   Acute on chronic heart failure with preserved ejection fraction (HCC)   Sepsis (HCC)   Open wound of right lower leg   Diarrhea of presumed infectious origin   Patient Summary: [name] is a 81 y.o. with a pertinent PMH of HFpEF, CKD3B, PVD  with chronic wound on her R leg, who presented with nausea, vomiting, abdominal pain, and diarrhea and admitted for sepsis workup.    #Sepsis (resolved)  Pt's vitals have continued to hold study. She endorsed having a half solid/have liquid bowel movement today twice. She states she has felt feverish, but temperature has been normal. She states her abdominal pain has gotten better as well.   Plan:  - Conservative treatment  - Pending GI Panel   #Acute hypoxic Respiratory Failure #HFpEF, CAD, HTN Last echo in 9/22 revealed an EF of 55%. We have been diuresing her and her output yesterday was close to -3000 net. No changes to leg swelling were able to be seen, as well as no improvement in respiratory status.Nurse yesterday had to increase O2 to 5L, and patient has been on it all night and during examination in the AM. Attempted to lower O2 to 3L, however her O2 sat went below 90.   Plan: - Due to persistent oxygen demands In the setting of diuresis, we suspect she may have pneumonia, and have ordered a chest x-ray to evaluate - Continue diuresis with lasix 80mg   - Encouraged patient to move up and out of bed occasionally to reduce risk of pneumonia     Diet: Normal IVF: NS,10cc/hr Code: Full    Dispo:  Anticipated discharge tin more than 2 midnights  Drucie Opitz, MD PGY-1 Internal Medicine Resident Pager Number [phone] Please contact the on call pager after 5 pm and on weekends at [phone].

## 2022-06-06 ENCOUNTER — Inpatient Hospital Stay (HOSPITAL_COMMUNITY): Payer: Medicare Other

## 2022-06-06 LAB — BASIC METABOLIC PANEL
Anion gap: 8 (ref 5–15)
BUN: 18 mg/dL (ref 8–23)
CO2: 29 mmol/L (ref 22–32)
Calcium: 8.1 mg/dL — ABNORMAL LOW (ref 8.9–10.3)
Chloride: 106 mmol/L (ref 98–111)
Creatinine, Ser: 1.8 mg/dL — ABNORMAL HIGH (ref 0.44–1.00)
GFR, Estimated: 28 mL/min — ABNORMAL LOW (ref >60)
Glucose, Bld: 99 mg/dL (ref 70–99)
Potassium: 3.5 mmol/L (ref 3.5–5.1)
Sodium: 143 mmol/L (ref 135–145)

## 2022-06-06 LAB — CBC
HCT: 37.6 % (ref 36.0–46.0)
Hemoglobin: 11.7 g/dL — ABNORMAL LOW (ref 12.0–15.0)
MCH: 32 pg (ref 26.0–34.0)
MCHC: 31.1 g/dL (ref 30.0–36.0)
MCV: 102.7 fL — ABNORMAL HIGH (ref 80.0–100.0)
Platelets: 110 10*3/uL — ABNORMAL LOW (ref 150–400)
RBC: 3.66 MIL/uL — ABNORMAL LOW (ref 3.87–5.11)
RDW: 17.3 % — ABNORMAL HIGH (ref 11.5–15.5)
WBC: 6.3 10*3/uL (ref 4.0–10.5)
nRBC: 0 % (ref 0.0–0.2)

## 2022-06-06 LAB — ECHOCARDIOGRAM COMPLETE
Height: 67 in
S' Lateral: 2.7 cm
Weight: 4155.23 oz

## 2022-06-06 LAB — TSH: TSH: 1.081 u[IU]/mL (ref 0.350–4.500)

## 2022-06-06 MED ORDER — PERFLUTREN LIPID MICROSPHERE
1.0000 mL | INTRAVENOUS | Status: AC | PRN
  Administered 2022-06-06: 2 mL via INTRAVENOUS

## 2022-06-06 MED ORDER — IPRATROPIUM-ALBUTEROL 0.5-2.5 (3) MG/3ML IN SOLN
3.0000 mL | RESPIRATORY_TRACT | Status: DC
Start: 2022-06-06 — End: 2022-06-06

## 2022-06-06 MED ORDER — ALBUTEROL SULFATE (2.5 MG/3ML) 0.083% IN NEBU: 2.5000 mg | INHALATION_SOLUTION | RESPIRATORY_TRACT | Status: DC

## 2022-06-06 MED ORDER — FUROSEMIDE 10 MG/ML IJ SOLN
80.0000 mg | Freq: Once | INTRAMUSCULAR | Status: AC
  Administered 2022-06-06: 80 mg via INTRAVENOUS
  Filled 2022-06-06: qty 8

## 2022-06-06 MED ORDER — IPRATROPIUM-ALBUTEROL 0.5-2.5 (3) MG/3ML IN SOLN
3.0000 mL | RESPIRATORY_TRACT | Status: DC
  Administered 2022-06-06 – 2022-06-07 (×4): 3 mL via RESPIRATORY_TRACT
  Filled 2022-06-06 (×4): qty 3

## 2022-06-06 MED ORDER — FUROSEMIDE 10 MG/ML IJ SOLN
80.0000 mg | Freq: Once | INTRAMUSCULAR | Status: AC
Start: 2022-06-06 — End: 2022-06-06
  Administered 2022-06-06: 80 mg via INTRAVENOUS
  Filled 2022-06-06: qty 8

## 2022-06-06 MED ORDER — IPRATROPIUM-ALBUTEROL 0.5-2.5 (3) MG/3ML IN SOLN
3.0000 mL | Freq: Four times a day (QID) | RESPIRATORY_TRACT | Status: DC
  Administered 2022-06-06: 3 mL via RESPIRATORY_TRACT
  Filled 2022-06-06: qty 3

## 2022-06-06 NOTE — Progress Notes (Signed)
  Echocardiogram 2D Echocardiogram with contrast has been performed.  [name] F 06/06/2022, 12:27 PM

## 2022-06-06 NOTE — Plan of Care (Signed)
  Problem: Education: Goal: Knowledge of General Education information will improve Description: Including pain rating scale, medication(s)/side effects and non-pharmacologic comfort measures Outcome: Progressing   Problem: Health Behavior/Discharge Planning: Goal: Ability to manage health-related needs will improve Outcome: Not Progressing   Problem: Clinical Measurements: Goal: Ability to maintain clinical measurements within normal limits will improve Outcome: Progressing Goal: Will remain free from infection Outcome: Progressing Goal: Diagnostic test results will improve Outcome: Progressing Goal: Respiratory complications will improve Outcome: Progressing Goal: Cardiovascular complication will be avoided Outcome: Progressing   Problem: Activity: Goal: Risk for activity intolerance will decrease Outcome: Not Progressing   Problem: Nutrition: Goal: Adequate nutrition will be maintained Outcome: Progressing   Problem: Pain Managment: Goal: General experience of comfort will improve Outcome: Progressing   Problem: Safety: Goal: Ability to remain free from injury will improve Outcome: Progressing   Problem: Skin Integrity: Goal: Risk for impaired skin integrity will decrease Outcome: Progressing

## 2022-06-06 NOTE — Progress Notes (Addendum)
MD notified due to patient having a nosebleed. Applied gauze to her nostril her oxygen is already on humidification. Patient saturated several pieces of gauzes.  Spoke to MD told to page again if nosebleed does not stop. At 2030 patients nosebleed finally stopped.

## 2022-06-06 NOTE — Progress Notes (Addendum)
NAME:  [name], MRN:  [id number], DOB:  Nov 07, 1940, LOS: 3 ADMISSION DATE:  06/03/2022  Subjective  Patient evaluated at bedside this AM. She is reporting chest pain that worsens with breathing and when pressing on her chest. Patient is also complaining of nausea and vomiting. Patient states that she had an episode of loose stool yesterday but has not had a bowel movement this morning.   Objective   Blood pressure 94/64, pulse 79, temperature 98.4 F (36.9 C), temperature source Oral, resp. rate 16, height 5\' 7"  (1.702 m), weight 117.8 kg, SpO2 90 %.     Intake/Output Summary (Last 24 hours) at 06/06/2022 1723 Last data filed at 06/06/2022 1608 Gross per 24 hour  Intake --  Output 1250 ml  Net -1250 ml   Filed Weights   06/03/22 0146 06/06/22 0444  Weight: (!) 149.7 kg 117.8 kg   Physical Exam: General: Appears uncomfortable, mild distress CV: Regular rate, irregular rhythm. No murmurs appreciated. Mid-sternal chest pain reproducible  Pulm: Increased work of breathing w/ use accessory muscles. Mild expiratory wheezing, minimal air movement. Mild bibasilar rales. Abdomen: Soft, non-tender, non-distended. Normoactive bowel sounds. MSK: 1-2+ peripheral edema bilateral lower extremities Neuro: Awake, alert, conversing appropriately. Grossly non-focal  Labs       Latest Ref Rng & Units 06/06/2022    3:28 AM 06/05/2022   11:51 AM 06/04/2022   12:37 AM  CBC  WBC 4.0 - 10.5 K/uL 6.3  7.8  9.5   Hemoglobin 12.0 - 15.0 g/dL 11.7  13.0  12.1   Hematocrit 36.0 - 46.0 % 37.6  40.1  37.7   Platelets 150 - 400 K/uL 110  120  115       Latest Ref Rng & Units 06/06/2022    3:28 AM 06/05/2022   11:51 AM 06/04/2022   12:37 AM  BMP  Glucose 70 - 99 mg/dL 99  129  107   BUN 8 - 23 mg/dL 18  19  23    Creatinine 0.44 - 1.00 mg/dL 1.80  1.84  1.82   Sodium 135 - 145 mmol/L 143  142  140   Potassium 3.5 - 5.1 mmol/L 3.5  3.7  4.0   Chloride 98 - 111 mmol/L 106  104  111   CO2 22  - 32 mmol/L 29  30  24    Calcium 8.9 - 10.3 mg/dL 8.1  8.4  7.7     Summary  Rebbecca [name] is 81yo person with chronic diastolic heart failure, chronic kidney disease stage IIIb, peripheral vascular disease admitted 8/10 with sepsis likely 2/2 acute gastroenteritis, who developed acute heart failure due to aggressive fluid resuscitation, having increase in supplemental oxygen requirements despite diuresis.   Assessment & Plan:  Active Problems:   Acute on chronic heart failure with preserved ejection fraction (HCC)   Sepsis ([place])   Open wound of right lower leg   Diarrhea of presumed infectious origin  #Acute hypoxic respiratory failure #Acute on chronic diastolic heart failure #OHS/OSA This morning, [name] continues to be on 5L nasal cannula. Over last 24h 2300cc, although intake has not been reported in the chart. Renal function stable. This morning, [name] had another episode of chest pain, similar to last evening. On exam, she continues to appear hypervolemic. In addition, I appreciated wheezing with poor air movement, likely due to OHS/OSA. After albuterol treatment, [name] reported improvement of symptoms. At this juncture, she is not having fevers or new cough; lab work  continues to show no leukocytosis. I do not believe there is an underlying pneumonia contributing at this time. After this morning's lasix, patient had 900cc urine output and is now on 8L high flow nasal cannula. Will give another dose this evening, might need to consider increasing dose tomorrow if not having good response - difficult to tell with her exam and without accurate I/O. I will obtain a BNP in the morning to compare to arrival so we can formulate the best plan moving forward.  - IV furosemide 80mg  twice daily today - DuoNebs every 4 hours while awake - Albuterol nebulizer every 4 hours as needed - Follow-up repeat BNP in the morning - Continuous pulse oximetry, telemetry -  Follow-up Echocardiogram - Strict ins and outs - Daily weights - Daily BMP  #Paroxysmal atrial fibrillation #Sick sinus syndrome s/p PPM Patient holds a diagnosis of paroxysmal atrial fibrillation and has been on anticoagulation for this. This morning, it appears she has converted to atrial fibrillation. She reports she can typically feel when she is in atrial fibrillation, but this is not how she felt this morning. Thankfully she is currently rate controlled with HR 80-90's. It is a possibility that atrial fibrillation is causing some of her symptoms, but I think it is more likely atrial fibrillation is more indicative of her hypervolemic state. Plan to continue her home amiodarone and anticoagulation and aggressively diurese. We are pending an Echo for further evaluation as well. - Continue home amiodarone 200mg  daily - Continue home Eliquis 5mg  twice daily - Follow-up TSH  - Holding home beta blocker due to soft pressures  #Acute on chronic renal failure Baseline renal function sCr ~1.2. On arrival 2.1, thought to be mainly pre-renal in nature due to severe vomiting/diarrhea. Initially sCr improved to 1.82 with IVF resuscitation, but has remained stable since as we have been diuresing. Suspect cardiorenal playing a role at this point. We will continue to aggressively diurese, expect renal function to improve.  - Diuresis as above - Daily BMP - Strict ins and out - Avoid nephrotoxins  #Acute gastroenteritis This morning, patient did have an episode of vomiting. However, she has not had any bowel movements since yesterday and has not had any loose stools in over 24h. Suspicion for bacterial gastroenteritis/C. Diff is low at this point. Will discontinue these labs and enteric precautions. For her symptoms, we need to be cautious with anti-emetics given her prolonged QT. Ativan is not a safe option considering her age, co-morbidities, and current acute illness. Qtc 533 today, would recommend  one-time oral zofran if needed followed by ECG.   - Discontinue C. Diff/GI panel - Stop enteric precautions - One time oral Zofran if needed for nausea  #Thrombocytopenia Platelets stable, 110 this morning. Appears this was on previous lab work   #Chronic venous stasis ulcers Stable, appreciate wound care's assistance and recommendations. - Wound care per WOC  #Vitamin B12 deficiency Macrocytic anemia on CBC, appears stable. Will continue with home supplementation. - Cyanocobalamin 1,075mcg daily  Best practice:  DIET: HH IVF: n/a DVT PPX: Eliquis BOWEL: n/a CODE: FULL FAM COM: n/a  Sanjuan Dame, MD Internal Medicine Resident PGY-3 PAGER: [phone] 06/06/2022 5:23 PM  If after hours (below), please contact on-call pager: [phone] 5PM-7AM Monday-Friday 1PM-7AM Saturday-Sunday

## 2022-06-07 DIAGNOSIS — I110 Hypertensive heart disease with heart failure: Secondary | ICD-10-CM

## 2022-06-07 DIAGNOSIS — I5033 Acute on chronic diastolic (congestive) heart failure: Secondary | ICD-10-CM

## 2022-06-07 DIAGNOSIS — J9601 Acute respiratory failure with hypoxia: Secondary | ICD-10-CM | POA: Diagnosis not present

## 2022-06-07 DIAGNOSIS — L7682 Other postprocedural complications of skin and subcutaneous tissue: Secondary | ICD-10-CM

## 2022-06-07 DIAGNOSIS — K529 Noninfective gastroenteritis and colitis, unspecified: Secondary | ICD-10-CM

## 2022-06-07 DIAGNOSIS — E8770 Fluid overload, unspecified: Secondary | ICD-10-CM

## 2022-06-07 DIAGNOSIS — I2510 Atherosclerotic heart disease of native coronary artery without angina pectoris: Secondary | ICD-10-CM | POA: Diagnosis not present

## 2022-06-07 LAB — BASIC METABOLIC PANEL
Anion gap: 9 (ref 5–15)
BUN: 20 mg/dL (ref 8–23)
CO2: 30 mmol/L (ref 22–32)
Calcium: 8.1 mg/dL — ABNORMAL LOW (ref 8.9–10.3)
Chloride: 103 mmol/L (ref 98–111)
Creatinine, Ser: 1.89 mg/dL — ABNORMAL HIGH (ref 0.44–1.00)
GFR, Estimated: 27 mL/min — ABNORMAL LOW (ref >60)
Glucose, Bld: 97 mg/dL (ref 70–99)
Potassium: 3.4 mmol/L — ABNORMAL LOW (ref 3.5–5.1)
Sodium: 142 mmol/L (ref 135–145)

## 2022-06-07 LAB — CBC
HCT: 34.1 % — ABNORMAL LOW (ref 36.0–46.0)
Hemoglobin: 11 g/dL — ABNORMAL LOW (ref 12.0–15.0)
MCH: 32.8 pg (ref 26.0–34.0)
MCHC: 32.3 g/dL (ref 30.0–36.0)
MCV: 101.8 fL — ABNORMAL HIGH (ref 80.0–100.0)
Platelets: 124 10*3/uL — ABNORMAL LOW (ref 150–400)
RBC: 3.35 MIL/uL — ABNORMAL LOW (ref 3.87–5.11)
RDW: 17.3 % — ABNORMAL HIGH (ref 11.5–15.5)
WBC: 4.9 10*3/uL (ref 4.0–10.5)
nRBC: 0 % (ref 0.0–0.2)

## 2022-06-07 LAB — BRAIN NATRIURETIC PEPTIDE: B Natriuretic Peptide: 197.7 pg/mL — ABNORMAL HIGH (ref 0.0–100.0)

## 2022-06-07 MED ORDER — GUAIFENESIN 100 MG/5ML PO LIQD
5.0000 mL | ORAL | Status: DC | PRN
  Administered 2022-06-07 – 2022-06-08 (×3): 5 mL via ORAL
  Filled 2022-06-07 (×3): qty 5

## 2022-06-07 MED ORDER — FUROSEMIDE 10 MG/ML IJ SOLN
80.0000 mg | Freq: Two times a day (BID) | INTRAMUSCULAR | Status: DC
  Administered 2022-06-07 – 2022-06-08 (×2): 80 mg via INTRAVENOUS
  Filled 2022-06-07 (×2): qty 8

## 2022-06-07 MED ORDER — IPRATROPIUM-ALBUTEROL 0.5-2.5 (3) MG/3ML IN SOLN
3.0000 mL | Freq: Three times a day (TID) | RESPIRATORY_TRACT | Status: DC
  Administered 2022-06-07: 3 mL via RESPIRATORY_TRACT
  Filled 2022-06-07 (×2): qty 3

## 2022-06-07 NOTE — Care Management Important Message (Signed)
Important Message  Patient Details  Name: [name] MRN: [id number] Date of Birth: June 27, 1941   Medicare Important Message Given:  Yes     Boleslaw Borghi [address] 06/07/2022, 2:51 PM

## 2022-06-07 NOTE — Plan of Care (Signed)
  Problem: Nutrition: Goal: Adequate nutrition will be maintained Outcome: Progressing   Problem: Pain Managment: Goal: General experience of comfort will improve Outcome: Progressing   Problem: Skin Integrity: Goal: Risk for impaired skin integrity will decrease Outcome: Progressing   

## 2022-06-07 NOTE — Progress Notes (Signed)
Subjective:   Summary: [name] is a 81 y.o. year old female currently admitted on the IMTS HD#4 initially for hypotension secondary to severe gastroenteritis .  Overnight Events: NOE   Pt was seen at bedside today. She stated she had a loose bowel movement today. Physical therapy has been working with her to get her ambulating. She denies any abdominal pain at the moment. She is currently on 7L of O2 and sat around 95%. She states she is having some chest tightness.   Objective:  Vital signs in last 24 hours: Vitals:   06/07/22 0416 06/07/22 0800 06/07/22 1200 06/07/22 1218  BP:  121/69 124/69   Pulse:  72 74 71  Resp:  18  18  Temp:  98.7 F (37.1 C) 98.4 F (36.9 C)   TempSrc:  Oral Oral   SpO2:  96% 92% 96%  Weight: (!) 179.6 kg     Height:       Supplemental O2: Nasal Cannula SpO2: 96 % O2 Flow Rate (L/min): (S) 7 L/min (decreasing per sats)   Physical Exam:  Constitutional: Obese woman laying in bed, in no acute distress Cardiovascular: RRR, no murmurs, rubs or gallops Pulmonary/Chest: Crackles and rales in the lower lobes bilaterally Skin: warm and dry Extremities: Bilateral LE Edema +2 w/ chronic venous ulcers. Currently in compression wrappings.   Filed Weights   06/03/22 0146 06/06/22 0444 06/07/22 0416  Weight: (!) 149.7 kg 117.8 kg (!) 179.6 kg     Intake/Output Summary (Last 24 hours) at 06/07/2022 1604 Last data filed at 06/07/2022 0251 Gross per 24 hour  Intake 240 ml  Output 950 ml  Net -710 ml   Net IO Since Admission: -7,591.96 mL [06/07/22 1604]  Pertinent Labs:    Latest Ref Rng & Units 06/07/2022    7:36 AM 06/06/2022    3:28 AM 06/05/2022   11:51 AM  CBC  WBC 4.0 - 10.5 K/uL 4.9  6.3  7.8   Hemoglobin 12.0 - 15.0 g/dL 11.0  11.7  13.0   Hematocrit 36.0 - 46.0 % 34.1  37.6  40.1   Platelets 150 - 400 K/uL 124  110  120        Latest Ref Rng & Units 06/07/2022    7:36 AM 06/06/2022    3:28 AM 06/05/2022    11:51 AM  CMP  Glucose 70 - 99 mg/dL 97  99  129   BUN 8 - 23 mg/dL 20  18  19    Creatinine 0.44 - 1.00 mg/dL 1.89  1.80  1.84   Sodium 135 - 145 mmol/L 142  143  142   Potassium 3.5 - 5.1 mmol/L 3.4  3.5  3.7   Chloride 98 - 111 mmol/L 103  106  104   CO2 22 - 32 mmol/L 30  29  30    Calcium 8.9 - 10.3 mg/dL 8.1  8.1  8.4    Assessment/Plan:   Active Problems:   Acute on chronic heart failure with preserved ejection fraction (HCC)   Sepsis (HCC)   Open wound of right lower leg   Diarrhea of presumed infectious origin   Patient Summary: JASSLYN FINKEL is a 81 y.o. with a pertinent PMH of HFpEF, CKD3B, PVD with chronic wound on her R leg, who presented with nausea, vomiting, abdominal pain, and diarrhea and admitted for severe gastroenteritis.    #Acute  Hypoxic Respiratory Failure #HFpEF, CAD, HTN, Volume Overload State Pt is currently saturating at 95% with 7L of Oxygen through her cannula. She is still in a volume overloaded state as there are crackles and rales on lower lobes bilaterally. Her oxygen needs have increased from 3L, 5L, 8L yesterday, and now 7L. Last echo 9/22 revealed an EF of 55%. Pt is currently at a net -1210 for her output, and will continue diuresing. Blood pressure has been holding steady throughout her course of diuresing.   Plan:   -Continue diuresis with Lasix 80mg  BID -Continue monitoring BMP and kidney function  -Echo was reordered and results are pending  -Strict I/O - Continue telemetry and re-evaluating oxygen requirements - Duonebs every 4 hours     #Acute Gastroenteritis Pt denies any nausea, vomiting, pain in the abdomen today. Her vitals have remained stable as well. She had a bowel movement today and stated it was loose.   -Continue conservative management with Saline infusion.      IVF: NS,10cc/hr Code: Full    Dispo: Anticipated discharge in less than two midnights.   Drucie Opitz, MD PGY-1 Internal Medicine  Resident Pager Number [phone] Please contact the on call pager after 5 pm and on weekends at [phone].

## 2022-06-07 NOTE — Progress Notes (Signed)
Physical Therapy Treatment Patient Details Name: [name] MRN: [id number] DOB: 1940-12-26 Today's Date: 06/07/2022   History of Present Illness Pt is a 81 year old woman admitted from home on 06/03/22 with abdominal pain, n/v/d, sepsis. PMH: dCHF, CKD IIb, PAF on Eliquis, SSS s/p pacemaker, HTN, CAD, HLD, CVA, chronic LE wounds, depression, obesity.    PT Comments    Pt eager to get OOB as pt with need to have BM. Overall, pt is requiring moderate physical assist for transfer-level mobility at this time, needs +2 for into and out of bed. At baseline, pt with no physical assist needs so she is still far from mobility baseline. Pt with multiple productive coughing bouts during session, SPO2 maintained >88% on 7LO2 throughout session. PT to continue to follow acutely.       Recommendations for follow up therapy are one component of a multi-disciplinary discharge planning process, led by the attending physician.  Recommendations may be updated based on patient status, additional functional criteria and insurance authorization.  Follow Up Recommendations  Skilled nursing-short term rehab (<3 hours/day) (pt wants d/c home) Can patient physically be transported by private vehicle: No   Assistance Recommended at Discharge Frequent or constant Supervision/Assistance  Patient can return home with the following A lot of help with walking and/or transfers;A lot of help with bathing/dressing/bathroom   Equipment Recommendations  None recommended by PT    Recommendations for Other Services       Precautions / Restrictions Precautions Precautions: Fall Restrictions Weight Bearing Restrictions: No     Mobility  Bed Mobility Overal bed mobility: Needs Assistance Bed Mobility: Supine to Sit, Sit to Supine     Supine to sit: Mod assist, +2 for physical assistance Sit to supine: Mod assist, +2 for physical assistance   General bed mobility comments: assist for LE progression  to/from EOB, trunk elevation/lowering, and positioning in supine once returned to bed.    Transfers Overall transfer level: Needs assistance Equipment used: Rolling walker (2 wheels)   Sit to Stand: Mod assist, +2 safety/equipment   Step pivot transfers: Mod assist, +2 safety/equipment       General transfer comment: assist for rise, steady, and safe pivot to/from [facility]. Increased time, effort.    Ambulation/Gait                   Stairs             Wheelchair Mobility    Modified Rankin (Stroke Patients Only)       Balance Overall balance assessment: Needs assistance Sitting-balance support: Feet supported Sitting balance-Leahy Scale: Fair     Standing balance support: Bilateral upper extremity supported Standing balance-Leahy Scale: Poor                              Cognition Arousal/Alertness: Awake/alert Behavior During Therapy: Anxious Overall Cognitive Status: Within Functional Limits for tasks assessed                                          Exercises      General Comments General comments (skin integrity, edema, etc.): 7LO2 via , VSS and SPO2 88% and above throughout when accurate      Pertinent Vitals/Pain Pain Assessment Pain Assessment: Faces Faces Pain Scale: Hurts little more Pain Location: LEs Pain Descriptors /  Indicators: Discomfort Pain Intervention(s): Limited activity within patient's tolerance, Monitored during session, Repositioned    Home Living                          Prior Function            PT Goals (current goals can now be found in the care plan section) Acute Rehab PT Goals Patient Stated Goal: home PT Goal Formulation: With patient Time For Goal Achievement: 06/18/22 Potential to Achieve Goals: Good Progress towards PT goals: Progressing toward goals    Frequency    Min 3X/week      PT Plan Current plan remains appropriate    Co-evaluation               AM-PAC PT "6 Clicks" Mobility   Outcome Measure  Help needed turning from your back to your side while in a flat bed without using bedrails?: A Lot Help needed moving from lying on your back to sitting on the side of a flat bed without using bedrails?: A Lot Help needed moving to and from a bed to a chair (including a wheelchair)?: A Lot Help needed standing up from a chair using your arms (e.g., wheelchair or bedside chair)?: A Lot Help needed to walk in hospital room?: Total Help needed climbing 3-5 steps with a railing? : Total 6 Click Score: 10    End of Session Equipment Utilized During Treatment: Oxygen Activity Tolerance: Patient tolerated treatment well;Patient limited by fatigue Patient left: in bed;with call bell/phone within reach;with bed alarm set Nurse Communication: Mobility status PT Visit Diagnosis: Other abnormalities of gait and mobility (R26.89);Muscle weakness (generalized) (M62.81)     Time: 2836-6294 PT Time Calculation (min) (ACUTE ONLY): 21 min  Charges:  $Therapeutic Activity: 8-22 mins                     Stacie Glaze, PT DPT Acute Rehabilitation Services Pager [phone]  Office [phone]    [place] E Ruffin Pyo 06/07/2022, 1:13 PM

## 2022-06-08 DIAGNOSIS — I2510 Atherosclerotic heart disease of native coronary artery without angina pectoris: Secondary | ICD-10-CM | POA: Diagnosis not present

## 2022-06-08 DIAGNOSIS — J9601 Acute respiratory failure with hypoxia: Secondary | ICD-10-CM | POA: Diagnosis not present

## 2022-06-08 DIAGNOSIS — I5033 Acute on chronic diastolic (congestive) heart failure: Secondary | ICD-10-CM | POA: Diagnosis not present

## 2022-06-08 DIAGNOSIS — I110 Hypertensive heart disease with heart failure: Secondary | ICD-10-CM | POA: Diagnosis not present

## 2022-06-08 LAB — CULTURE, BLOOD (ROUTINE X 2)
Culture: NO GROWTH
Culture: NO GROWTH
Special Requests: ADEQUATE
Special Requests: ADEQUATE

## 2022-06-08 LAB — BASIC METABOLIC PANEL
Anion gap: 5 (ref 5–15)
BUN: 22 mg/dL (ref 8–23)
CO2: 34 mmol/L — ABNORMAL HIGH (ref 22–32)
Calcium: 8.3 mg/dL — ABNORMAL LOW (ref 8.9–10.3)
Chloride: 104 mmol/L (ref 98–111)
Creatinine, Ser: 1.68 mg/dL — ABNORMAL HIGH (ref 0.44–1.00)
GFR, Estimated: 31 mL/min — ABNORMAL LOW (ref >60)
Glucose, Bld: 102 mg/dL — ABNORMAL HIGH (ref 70–99)
Potassium: 3.9 mmol/L (ref 3.5–5.1)
Sodium: 143 mmol/L (ref 135–145)

## 2022-06-08 LAB — CBC
HCT: 35.2 % — ABNORMAL LOW (ref 36.0–46.0)
Hemoglobin: 11.5 g/dL — ABNORMAL LOW (ref 12.0–15.0)
MCH: 33.1 pg (ref 26.0–34.0)
MCHC: 32.7 g/dL (ref 30.0–36.0)
MCV: 101.4 fL — ABNORMAL HIGH (ref 80.0–100.0)
Platelets: 154 10*3/uL (ref 150–400)
RBC: 3.47 MIL/uL — ABNORMAL LOW (ref 3.87–5.11)
RDW: 16.9 % — ABNORMAL HIGH (ref 11.5–15.5)
WBC: 4.3 10*3/uL (ref 4.0–10.5)
nRBC: 0 % (ref 0.0–0.2)

## 2022-06-08 MED ORDER — IPRATROPIUM-ALBUTEROL 0.5-2.5 (3) MG/3ML IN SOLN
3.0000 mL | Freq: Two times a day (BID) | RESPIRATORY_TRACT | Status: DC
  Administered 2022-06-09 – 2022-06-14 (×11): 3 mL via RESPIRATORY_TRACT
  Filled 2022-06-08 (×12): qty 3

## 2022-06-08 MED ORDER — POLYETHYLENE GLYCOL 3350 17 G PO PACK
17.0000 g | PACK | Freq: Every day | ORAL | Status: DC
  Administered 2022-06-08 – 2022-06-10 (×3): 17 g via ORAL
  Filled 2022-06-08 (×5): qty 1

## 2022-06-08 MED ORDER — FUROSEMIDE 10 MG/ML IJ SOLN: 80.0000 mg | Freq: Every day | INTRAMUSCULAR | Status: DC

## 2022-06-08 MED ORDER — TORSEMIDE 20 MG PO TABS
40.0000 mg | ORAL_TABLET | Freq: Every day | ORAL | Status: DC
  Administered 2022-06-09 – 2022-06-15 (×7): 40 mg via ORAL
  Filled 2022-06-08 (×8): qty 2

## 2022-06-08 MED ORDER — DOXYCYCLINE HYCLATE 100 MG PO TABS
100.0000 mg | ORAL_TABLET | Freq: Two times a day (BID) | ORAL | Status: DC
  Administered 2022-06-08 – 2022-06-12 (×8): 100 mg via ORAL
  Filled 2022-06-08 (×8): qty 1

## 2022-06-08 NOTE — Progress Notes (Signed)
CSW following for SNF placement once patient is requiring less oxygen. Full assessment to follow.   Gilmore Laroche, MSW, [employer]

## 2022-06-08 NOTE — Progress Notes (Signed)
Physical Therapy Treatment Patient Details Name: [name] MRN: [id number] DOB: September 25, 1941 Today's Date: 06/08/2022   History of Present Illness Pt is a 81 year old woman admitted from home on 06/03/22 with abdominal pain, n/v/d, sepsis. PMH: dCHF, CKD IIb, PAF on Eliquis, SSS s/p pacemaker, HTN, CAD, HLD, CVA, chronic LE wounds, depression, obesity.    PT Comments    Pt progressing slowly with mobility, tolerating forward stepping today with use of RW as opposed to pivotal stepping only. Pt fatigues quickly and requires seated rest breaks, pt with good understanding of fatigue level and when to rest. Pt still requiring up to mod physical assist for transfers, and will need physical assist once d/c home. Per pt, she wants to d/c home with increased overnight and afternoon support from her family. Per MD, pt will have 3 hours each day where she is unsupervised. At this time, it would be recommended to have someone with her with all mobility. PT to conitnue to follow and will progress pt mobility as tolerated.   SPO2 94% and greater on 4LO2 at rest today, 5LO2 used when pt mobilizing    Recommendations for follow up therapy are one component of a multi-disciplinary discharge planning process, led by the attending physician.  Recommendations may be updated based on patient status, additional functional criteria and insurance authorization.  Follow Up Recommendations  Home health PT Can patient physically be transported by private vehicle: No   Assistance Recommended at Discharge Frequent or constant Supervision/Assistance  Patient can return home with the following A lot of help with walking and/or transfers;A lot of help with bathing/dressing/bathroom   Equipment Recommendations  None recommended by PT    Recommendations for Other Services       Precautions / Restrictions Precautions Precautions: Fall Restrictions Weight Bearing Restrictions: No     Mobility  Bed  Mobility Overal bed mobility: Needs Assistance Bed Mobility: Supine to Sit     Supine to sit: Mod assist, +2 for physical assistance     General bed mobility comments: assist to advance BLE and trunk elevation. +2 safety. extra time and effort. Seated rest break before pivoting hips to full EOB position    Transfers Overall transfer level: Needs assistance Equipment used: Rolling walker (2 wheels) Transfers: Sit to/from Stand, Bed to chair/wheelchair/BSC Sit to Stand: Mod assist, +2 safety/equipment, +2 physical assistance Stand pivot transfers: Mod assist, +2 safety/equipment Step pivot transfers: Mod assist, +2 safety/equipment       General transfer comment: step pivot to access BSC from EOB. powerup and steadying assist. Mod physical A +2 to powerup from [facility]. Mod A +2 safety to take pivotal steps to access recliner from [facility].    Ambulation/Gait               General Gait Details: x2 forward steps prior to pivoting towards recliner from [facility], mod +2 for steadying, guiding RW   Stairs             Wheelchair Mobility    Modified Rankin (Stroke Patients Only)       Balance Overall balance assessment: Needs assistance Sitting-balance support: Feet supported Sitting balance-Leahy Scale: Fair     Standing balance support: Bilateral upper extremity supported Standing balance-Leahy Scale: Poor                              Cognition Arousal/Alertness: Awake/alert Behavior During Therapy: Anxious, WFL for tasks assessed/performed Overall  Cognitive Status: Within Functional Limits for tasks assessed                                          Exercises      General Comments General comments (skin integrity, edema, etc.): on 7LO2 at start of session, bumped down to 5LO2 during mobility with SpO2 maintained >91%. Placed on 4LO2 at end of session with SPO2 97% at rest      Pertinent Vitals/Pain Pain Assessment Pain Assessment:  Faces Faces Pain Scale: Hurts little more Pain Location: LEs Pain Descriptors / Indicators: Discomfort Pain Intervention(s): Limited activity within patient's tolerance, Monitored during session, Repositioned    Home Living                          Prior Function            PT Goals (current goals can now be found in the care plan section) Acute Rehab PT Goals Patient Stated Goal: home PT Goal Formulation: With patient Time For Goal Achievement: 06/18/22 Potential to Achieve Goals: Good Progress towards PT goals: Progressing toward goals    Frequency    Min 3X/week      PT Plan Discharge plan needs to be updated    Co-evaluation PT/OT/SLP Co-Evaluation/Treatment: Yes Reason for Co-Treatment: For patient/therapist safety PT goals addressed during session: Mobility/safety with mobility;Balance OT goals addressed during session: ADL's and self-care      AM-PAC PT "6 Clicks" Mobility   Outcome Measure  Help needed turning from your back to your side while in a flat bed without using bedrails?: A Lot Help needed moving from lying on your back to sitting on the side of a flat bed without using bedrails?: A Lot Help needed moving to and from a bed to a chair (including a wheelchair)?: A Lot Help needed standing up from a chair using your arms (e.g., wheelchair or bedside chair)?: A Lot Help needed to walk in hospital room?: A Lot Help needed climbing 3-5 steps with a railing? : A Lot 6 Click Score: 12    End of Session Equipment Utilized During Treatment: Oxygen Activity Tolerance: Patient tolerated treatment well;Patient limited by fatigue Patient left: in chair;with call bell/phone within reach;with chair alarm set Nurse Communication: Mobility status PT Visit Diagnosis: Other abnormalities of gait and mobility (R26.89);Muscle weakness (generalized) (M62.81)     Time: 1791-5056 PT Time Calculation (min) (ACUTE ONLY): 31 min  Charges:  $Therapeutic  Activity: 8-22 mins                     Stacie Glaze, PT DPT Acute Rehabilitation Services Pager [phone]  Office [phone]   [place] E Ruffin Pyo 06/08/2022, 1:35 PM

## 2022-06-08 NOTE — Progress Notes (Signed)
Subjective:   Summary: [name] is a 81 y.o. year old female currently admitted on the IMTS HD#5 for iatrogenic volume overload due to IVF resuscitation of hypotensive hypovolemia resulting from viral GE (which is resolved).  Overnight Events: NOE   [name] was seen today during rounds and was sitting on her couch in her room. She endorses feeling much better. She has not had a bowel movement today, but denies any nausea, vomiting, or abdominal pain. Her breathing has improved as well, and we were able to put her on room air. She was complaining of bilateral leg pain today.   Objective:  Vital signs in last 24 hours: Vitals:   06/07/22 2300 06/08/22 0000 06/08/22 0400 06/08/22 0740  BP:  121/78 121/72 120/74  Pulse: 75 75 74 71  Resp:  18 16   Temp:  98.7 F (37.1 C) 98.7 F (37.1 C) 98.3 F (36.8 C)  TempSrc:  Oral Oral   SpO2: 96% 95% 93% 95%  Weight:      Height:       Supplemental O2: Room Air O2 Level 95%    Physical Exam:  Constitutional: Obese woman sitting on couch  Cardiovascular: RRR, no murmurs, rubs or gallops Pulmonary/Chest: normal work of breathing on room air, crackles and rales in bilateral lower lobes, slightly improved Abdominal: soft, non-tender, non-distended Skin: warm and dry Extremities: upper/lower extremity pulses 2+, chronic venous ulcers in compression wrappings Filed Weights   06/03/22 0146 06/06/22 0444 06/07/22 0416  Weight: (!) 149.7 kg 117.8 kg (!) 179.6 kg     Intake/Output Summary (Last 24 hours) at 06/08/2022 1625 Last data filed at 06/08/2022 0948 Gross per 24 hour  Intake --  Output 2800 ml  Net -2800 ml   Net IO Since Admission: -10,391.96 mL [06/08/22 1625]  Pertinent Labs:    Latest Ref Rng & Units 06/08/2022    4:29 AM 06/07/2022    7:36 AM 06/06/2022    3:28 AM  CBC  WBC 4.0 - 10.5 K/uL 4.3  4.9  6.3   Hemoglobin 12.0 - 15.0 g/dL 11.5  11.0  11.7   Hematocrit 36.0 - 46.0 % 35.2   34.1  37.6   Platelets 150 - 400 K/uL 154  124  110        Latest Ref Rng & Units 06/08/2022    4:29 AM 06/07/2022    7:36 AM 06/06/2022    3:28 AM  CMP  Glucose 70 - 99 mg/dL 102  97  99   BUN 8 - 23 mg/dL 22  20  18    Creatinine 0.44 - 1.00 mg/dL 1.68  1.89  1.80   Sodium 135 - 145 mmol/L 143  142  143   Potassium 3.5 - 5.1 mmol/L 3.9  3.4  3.5   Chloride 98 - 111 mmol/L 104  103  106   CO2 22 - 32 mmol/L 34  30  29   Calcium 8.9 - 10.3 mg/dL 8.3  8.1  8.1     Assessment/Plan:   Active Problems:   Acute on chronic heart failure with preserved ejection fraction (HCC)   Sepsis (HCC)   Open wound of right lower leg   Diarrhea of presumed infectious origin   Patient Summary: [name] is a 81 y.o. with a pertinent PMH of HFpEF, CKD3B, PVD with chronic wound on both LE, who presented  with nausea, vomiting, abdominal pain, and diarrhea and admitted for severe gastroenteritis.    #Acute Hypoxic Respiratory Failure #HFpEF, CAD, HTN, Volume Overloaded State Pt states that she is feeling better. Breathing has improved throughout her course in the hospital. We were able to take her off of O2 and onto room air and she was saturating well in the 90s. ECHO revealed today that her EF has remained the same with no left ventricular wall abnormality. Blood pressure has been holding steady as well, and output today was -2400.   In terms of disposition, she has decided she wants to be sent home instead of a facility. She stated her grandson will come live with her, and she has an aid that will be there from 8-12, 5 days a week. She has a walk in shower with chair, hospital bed, and everything she needs at home, and does not want to be admitted to a nursing home. She is open to home physical therapy to help get her strength back.   Plan:  - Pt states her SOB has improved, and she looks much better as well. D/C's the IV Lasix 80 mg BID, and started her home medication of torsemide 40 mg  PO.  - Continue monitoring BMP  - Strict I/O - D/C Telemetry     #Post-Biopsy trauma injury w/ possible venous stasis  Pt was complaining of Lower extremity pain where her ulcers are present. Upon unwrapping her ulcers, both were bleeding and warm to touch. The area around the ulcers were tender to palpation as well. 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   Plan:  -Rewrapped the wounds with wound care recommendations  - Started oral doxycyline 100mg  BID  Diet: Heart Healthy IVF: None,None Code: Full   Dispo: Anticipated discharge in less than 2 midnights  Drucie Opitz, MD PGY-1 Internal Medicine Resident Pager Number [phone] Please contact the on call pager after 5 pm and on weekends at [phone].

## 2022-06-08 NOTE — Progress Notes (Signed)
Occupational Therapy Treatment Patient Details Name: [name] MRN: [id number] DOB: Nov 24, 1940 Today's Date: 06/08/2022   History of present illness Pt is a 81 year old woman admitted from home on 06/03/22 with abdominal pain, n/v/d, sepsis. PMH: dCHF, CKD IIb, PAF on Eliquis, SSS s/p pacemaker, HTN, CAD, HLD, CVA, chronic LE wounds, depression, obesity.   OT comments  Pt progressing towards acute OT goals. Completed toilet transfer and took pivotal steps to access recliner. Able to wean down from 7L O2 to 5L with SpO2 >91 (mainly 92/94). DOE 2/4, encouraged breathing exercises. Up in recliner at end of session. D/c recommendation remains appropriate although pt reports she is going home with family. No family present to confirm they are ok with this plan but pt mentioned several family members and an aide, has DME including hospital bed.   Recommendations for follow up therapy are one component of a multi-disciplinary discharge planning process, led by the attending physician.  Recommendations may be updated based on patient status, additional functional criteria and insurance authorization.    Follow Up Recommendations  Skilled nursing-short term rehab (<3 hours/day)    Assistance Recommended at Discharge Frequent or constant Supervision/Assistance  Patient can return home with the following  Two people to help with walking and/or transfers;A lot of help with bathing/dressing/bathroom;Assistance with cooking/housework;Direct supervision/assist for financial management;Assist for transportation;Help with stairs or ramp for entrance   Equipment Recommendations  BSC/3in1;Wheelchair (measurements OT);Wheelchair cushion (measurements OT)    Recommendations for Other Services      Precautions / Restrictions Precautions Precautions: Fall Restrictions Weight Bearing Restrictions: No       Mobility Bed Mobility Overal bed mobility: Needs Assistance Bed Mobility: Supine to  Sit     Supine to sit: Mod assist, +2 for physical assistance     General bed mobility comments: assist to advance BLE and trunk elevation. +2 safety. extra time and effort. Seated rest break before pivoting hips to full EOB position    Transfers Overall transfer level: Needs assistance Equipment used: Rolling walker (2 wheels) Transfers: Sit to/from Stand, Bed to chair/wheelchair/BSC Sit to Stand: Mod assist, +2 safety/equipment, +2 physical assistance Stand pivot transfers: Mod assist, +2 safety/equipment   Step pivot transfers: Mod assist, +2 safety/equipment     General transfer comment: step pivot to access BSC from EOB. powerup and steadying assist. Mod physical A +2 to powerup from [facility]. Mod A +2 safety to take pivotal steps to access recliner from [facility].     Balance Overall balance assessment: Needs assistance Sitting-balance support: Feet supported Sitting balance-Leahy Scale: Fair     Standing balance support: Bilateral upper extremity supported Standing balance-Leahy Scale: Poor                             ADL either performed or assessed with clinical judgement   ADL Overall ADL's : Needs assistance/impaired                         Toilet Transfer: Moderate assistance;+2 for physical assistance;+2 for safety/equipment Toilet Transfer Details (indicate cue type and reason): +2 physical assist to power up from [facility] after toileting, otherwise +2 for safety. Toileting- Clothing Manipulation and Hygiene: Total assistance;Sit to/from stand Toileting - Clothing Manipulation Details (indicate cue type and reason): +2 mod A to standt, total assist pericare       General ADL Comments: Pt sat EOB several minutes then took pivotal  steps to access BSC. Total A for pericare. Pt then walked a short distance to access recliner, mod A +2 safety. Encouraged breathing exercises. DOE 2/4.    Extremity/Trunk Assessment Upper Extremity Assessment Upper Extremity  Assessment: Generalized weakness   Lower Extremity Assessment Lower Extremity Assessment: Defer to PT evaluation        Vision       Perception     Praxis      Cognition Arousal/Alertness: Awake/alert Behavior During Therapy: Anxious, WFL for tasks assessed/performed Overall Cognitive Status: Within Functional Limits for tasks assessed                                          Exercises      Shoulder Instructions       General Comments On 7L O2 at start of session. able to tolerate coming down to 5L O2 with SpO2 >91, mainly 92-94. Left on 5L O2.    Pertinent Vitals/ Pain       Pain Assessment Pain Assessment: Faces Faces Pain Scale: Hurts little more Pain Location: LEs Pain Descriptors / Indicators: Discomfort Pain Intervention(s): Monitored during session, Limited activity within patient's tolerance, Repositioned  Home Living                                          Prior Functioning/Environment              Frequency  Min 2X/week        Progress Toward Goals  OT Goals(current goals can now be found in the care plan section)  Progress towards OT goals: Progressing toward goals  Acute Rehab OT Goals OT Goal Formulation: With patient Time For Goal Achievement: 06/18/22 Potential to Achieve Goals: Good ADL Goals Pt Will Perform Grooming: with min assist;standing Pt Will Perform Upper Body Bathing: with min assist;sitting Pt Will Perform Upper Body Dressing: with set-up;sitting Pt Will Transfer to Toilet: with min assist;ambulating;bedside commode Pt Will Perform Toileting - Clothing Manipulation and hygiene: with min assist;sit to/from stand Additional ADL Goal #1: pt will perform bed mobility with min assist in preparation for ADLs. Additional ADL Goal #2: Pt will state at least 3 energy conservation strategies as instructed.  Plan Discharge plan remains appropriate    Co-evaluation    PT/OT/SLP  Co-Evaluation/Treatment: Yes Reason for Co-Treatment: For patient/therapist safety   OT goals addressed during session: ADL's and self-care      AM-PAC OT "6 Clicks" Daily Activity     Outcome Measure   Help from another person eating meals?: None Help from another person taking care of personal grooming?: A Little Help from another person toileting, which includes using toliet, bedpan, or urinal?: Total Help from another person bathing (including washing, rinsing, drying)?: A Lot Help from another person to put on and taking off regular upper body clothing?: A Little Help from another person to put on and taking off regular lower body clothing?: Total 6 Click Score: 14    End of Session Equipment Utilized During Treatment: Rolling walker (2 wheels);Oxygen  OT Visit Diagnosis: Unsteadiness on feet (R26.81);Other abnormalities of gait and mobility (R26.89);Muscle weakness (generalized) (M62.81);Pain   Activity Tolerance Patient tolerated treatment well;Other (comment) (DOE 2/4)   Patient Left in chair;with call bell/phone within reach   Nurse Communication  Time: 2241-1464 OT Time Calculation (min): 32 min  Charges: OT General Charges $OT Visit: 1 Visit OT Treatments $Self Care/Home Management : 8-22 mins  Tyrone Schimke, OT Acute Rehabilitation Services Office: [phone]   Hortencia Pilar 06/08/2022, 12:23 PM

## 2022-06-09 LAB — BASIC METABOLIC PANEL
Anion gap: 9 (ref 5–15)
BUN: 24 mg/dL — ABNORMAL HIGH (ref 8–23)
CO2: 31 mmol/L (ref 22–32)
Calcium: 8.7 mg/dL — ABNORMAL LOW (ref 8.9–10.3)
Chloride: 102 mmol/L (ref 98–111)
Creatinine, Ser: 1.66 mg/dL — ABNORMAL HIGH (ref 0.44–1.00)
GFR, Estimated: 31 mL/min — ABNORMAL LOW (ref >60)
Glucose, Bld: 150 mg/dL — ABNORMAL HIGH (ref 70–99)
Potassium: 3.3 mmol/L — ABNORMAL LOW (ref 3.5–5.1)
Sodium: 142 mmol/L (ref 135–145)

## 2022-06-09 MED ORDER — PROCHLORPERAZINE EDISYLATE 10 MG/2ML IJ SOLN
10.0000 mg | Freq: Once | INTRAMUSCULAR | Status: AC
  Administered 2022-06-09: 10 mg via INTRAVENOUS
  Filled 2022-06-09: qty 2

## 2022-06-09 MED ORDER — PROCHLORPERAZINE MALEATE 10 MG PO TABS: 10.0000 mg | ORAL_TABLET | Freq: Once | ORAL | Status: DC

## 2022-06-09 MED ORDER — GUAIFENESIN-CODEINE 100-10 MG/5ML PO SOLN
5.0000 mL | ORAL | Status: DC | PRN
  Filled 2022-06-09: qty 5

## 2022-06-09 MED ORDER — SENNOSIDES-DOCUSATE SODIUM 8.6-50 MG PO TABS
1.0000 | ORAL_TABLET | Freq: Every day | ORAL | Status: DC
Start: 2022-06-09 — End: 2022-06-15
  Administered 2022-06-09 – 2022-06-13 (×4): 1 via ORAL
  Filled 2022-06-09 (×5): qty 1

## 2022-06-09 MED ORDER — PANTOPRAZOLE SODIUM 40 MG PO TBEC
40.0000 mg | DELAYED_RELEASE_TABLET | Freq: Two times a day (BID) | ORAL | Status: DC
  Administered 2022-06-09 – 2022-06-15 (×12): 40 mg via ORAL
  Filled 2022-06-09 (×12): qty 1

## 2022-06-09 NOTE — Progress Notes (Signed)
Subjective:   Summary: [name] is a 81 y.o. year old female currently admitted on the IMTS HD#6 for iatrogenic volume overload due to IVF resuscitation of hypotensive hypovolemia resulting from viral GE (which is resolved).  Overnight Events: Team was paged about pt having coughing spells that would lead to dry heaving, so post-tussive emesis. Overnight team changed her Robitussin to [place].    [name] was seen today during rounds and was sitting on her couch in the room. She states she's feeling good, and has not had trouble breathing. She dose endorse vomiting small amounts throughout the day. Overnight she was on 2L of O2, but during rounds she was on room air saturating well.   Objective:  Vital signs in last 24 hours: Vitals:   06/08/22 2354 06/09/22 0350 06/09/22 0847 06/09/22 0900  BP: (!) 155/92 137/76  128/71  Pulse: 60 79    Resp: 18 18    Temp: 99 F (37.2 C) 98.1 F (36.7 C)    TempSrc: Oral Oral    SpO2: (!) 88% 92% 94% 93%  Weight:  (!) 175.3 kg    Height:       Supplemental O2: Room Air   Physical Exam:  Constitutional: Obese woman sitting on couch  Cardiovascular: RRR, no murmurs, rubs or gallops Pulmonary/Chest: normal work of breathing on room air, slight rales heard in bilateral lower lobes Abdominal: soft, non-tender, non-distended Skin: warm and dry Extremities: upper/lower extremity pulses 2+, no lower extremity edema present  Filed Weights   06/06/22 0444 06/07/22 0416 06/09/22 0350  Weight: 117.8 kg (!) 179.6 kg (!) 175.3 kg     Intake/Output Summary (Last 24 hours) at 06/09/2022 1522 Last data filed at 06/09/2022 0300 Gross per 24 hour  Intake --  Output 400 ml  Net -400 ml   Net IO Since Admission: -10,791.96 mL [06/09/22 1522]  Pertinent Labs:    Latest Ref Rng & Units 06/08/2022    4:29 AM 06/07/2022    7:36 AM 06/06/2022    3:28 AM  CBC  WBC 4.0 - 10.5 K/uL 4.3  4.9  6.3   Hemoglobin  12.0 - 15.0 g/dL 11.5  11.0  11.7   Hematocrit 36.0 - 46.0 % 35.2  34.1  37.6   Platelets 150 - 400 K/uL 154  124  110        Latest Ref Rng & Units 06/09/2022   10:57 AM 06/08/2022    4:29 AM 06/07/2022    7:36 AM  CMP  Glucose 70 - 99 mg/dL 150  102  97   BUN 8 - 23 mg/dL 24  22  20    Creatinine 0.44 - 1.00 mg/dL 1.66  1.68  1.89   Sodium 135 - 145 mmol/L 142  143  142   Potassium 3.5 - 5.1 mmol/L 3.3  3.9  3.4   Chloride 98 - 111 mmol/L 102  104  103   CO2 22 - 32 mmol/L 31  34  30   Calcium 8.9 - 10.3 mg/dL 8.7  8.3  8.1     Assessment/Plan:   Active Problems:   Acute on chronic heart failure with preserved ejection fraction (HCC)   Sepsis (HCC)   Open wound of right lower leg   Diarrhea of presumed infectious origin   Patient Summary: [name] is a 81 y.o. with a pertinent PMH of HFpEf,  CKD3B, PVD with chronic wound on both LE, who presented with nausea, vomiting, abdominal pain, and diarrhea and admitted for severe gastroenteritis.    #Acute Hypoxic Respiratory Failure #HFpEF, CAD, HTN, Volume Overloaded State  Pt states her breathing is better day by day. At night, it seems so me de-saturate, which could be due to her hx of OSA. She was saturating well on room air today. Echo revealed that her EF is unchanged, and there is no left ventricular wall abnormality. Output today was -400, which has reduced because we transitioned her off of the IV Lasix 80 BID to Torsemide 40mg  PO.   Dispo: Pt would like to return home as her family has made accommodations for her.    Plan:  -Continue monitoring I/O  - Continue monitoring BMP - Oxygen requirements monitoring    #Post Biopsy Trauma Injury w/ possible Venous Stasis  Pt is still complaining of pain in her legs, she states they have marginally improved.  Area is still tender to touch. 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 We started her on oral doxycycline 100mg  BID for infection risk.   Plan:  -Continue Doxycyline  - Wound care   #Vomiting Pt has been complaining of nausea and vomiting since last night. Due to her QT interval, we are not able to give Zofran. Consulted pharmacy who recommended a one time dose of prochlorperazine 10mg  IV.    Diet: Heart Healthy Code: Full   Dispo: Anticipated discharge in less than two midnights.   Drucie Opitz, MD PGY-1 Internal Medicine Resident Pager Number [phone] Please contact the on call pager after 5 pm and on weekends at [phone].

## 2022-06-09 NOTE — Progress Notes (Signed)
Patient about worsening cough most recently productive of small volume of blood and dyspnea, went to evaluate patient.  Seems like she has been having fits of coughing where sometimes she coughs so violently that she starts retching for the past few days.  She says that this is worsened over the course of this afternoon.  During the most recent coughing fit she also had a small amount of blood in her otherwise clear/yellow sputum with a tiny clot as well.  Her nose and throat also feel dry when she gets oxygen therapy and she has also had brief, small-volume nosebleeds which she attributes to this.  Of note, she is also on Eliquis.  She has not had vomiting when she is not having a coughing fit.  She also endorses slightly worsening dyspnea and she had to be put back on supplemental oxygen, currently satting 92% on 2 L.  She says her breathing is primarily worse when she has a coughing fit, but also feels slightly worse at rest now compared to earlier today.  She denies any chest pain, but does say she has some chest tightness when she has coughing fits.  She endorses mild epigastric tenderness that has been consistent for the course of the hospitalization.  On my exam, she appears stable.  Vitals are BP 155/92, pulse 80, O2 sat 93% on 2 L [name].  She appears to have normal work of breathing on 2 L [name] and lungs are CTAB.  Abdominal exam is benign and cardiac exam is unchanged.  Low suspicion for worsening pulmonary edema or new infection given her reassuring respiratory exam at this time.  She has not gotten any IV fluids recently is continuing to get her daily IV diuresis.  She did say that she was previously using a CPAP but had to stop it because someone told her it was causing arrhythmias.  Of note, she does have a pacemaker in place.  Her vomiting seems to be clearly associated with violent fits of coughing and small volume of blood in sputum is likely associated with this as well.  Advised her to let us  know if she is having worsening symptoms including more blood or clots in vomit/sputum, chest pain, worsening respiratory status. - Not getting chest x-ray at this time, desaturation likely associated with fits of coughing plus possible component of OSA.  Low threshold to get chest x-ray later if her respiratory status continues to decline. - If she develops worse hemoptysis, will consider holding Eliquis and further work-up - Holding on symptomatic management for nausea because it seems to be associated with cough and because her QTc has been progressively getting longer.  Going to try switching her Robitussin to Robitussin AC.

## 2022-06-09 NOTE — Progress Notes (Signed)
Occupational Therapy Treatment Patient Details Name: [name] MRN: [id number] DOB: 02-03-1941 Today's Date: 06/09/2022   History of present illness Pt is a 81 year old woman admitted from home on 06/03/22 with abdominal pain, n/v/d, sepsis. PMH: dCHF, CKD IIb, PAF on Eliquis, SSS s/p pacemaker, HTN, CAD, HLD, CVA, chronic LE wounds, depression, obesity.   OT comments  Pt progressing towards acute OT goals. Tolerated OOB activity better this session compared to last. Min A +2 safety for taking pivotal steps to access BSC. D/c recommendation updated to below.    Recommendations for follow up therapy are one component of a multi-disciplinary discharge planning process, led by the attending physician.  Recommendations may be updated based on patient status, additional functional criteria and insurance authorization.    Follow Up Recommendations  Home health OT    Assistance Recommended at Discharge Frequent or constant Supervision/Assistance  Patient can return home with the following  Two people to help with walking and/or transfers;A lot of help with bathing/dressing/bathroom;Assistance with cooking/housework;Direct supervision/assist for financial management;Assist for transportation;Help with stairs or ramp for entrance   Equipment Recommendations  BSC/3in1;Wheelchair (measurements OT);Wheelchair cushion (measurements OT)    Recommendations for Other Services      Precautions / Restrictions Precautions Precautions: Fall Restrictions Weight Bearing Restrictions: No       Mobility Bed Mobility Overal bed mobility: Needs Assistance Bed Mobility: Supine to Sit     Supine to sit: Mod assist     General bed mobility comments: assist to advance BLEand steady trunk to powerup    Transfers Overall transfer level: Needs assistance Equipment used: Rolling walker (2 wheels) Transfers: Sit to/from Stand, Bed to chair/wheelchair/BSC Sit to Stand: Min assist, +2  safety/equipment Stand pivot transfers: Min assist, +2 safety/equipment   Step pivot transfers: +2 safety/equipment, Min assist     General transfer comment: EOB to BSC to recliner. Improved from last session. DOE 1/4.     Balance Overall balance assessment: Needs assistance Sitting-balance support: Feet supported Sitting balance-Leahy Scale: Fair     Standing balance support: Bilateral upper extremity supported Standing balance-Leahy Scale: Poor                             ADL either performed or assessed with clinical judgement   ADL Overall ADL's : Needs assistance/impaired                         Toilet Transfer: Min guard;Minimal assistance;+2 for safety/equipment;Stand-pivot   Toileting- Clothing Manipulation and Hygiene: Total assistance;Sit to/from stand         General ADL Comments: Pt toelrated sitting EOB a few minutes then took pivotal steps to access BSC. After toileting tasks pt walked short distance to access recliner. less assist needed today compared to last session.    Extremity/Trunk Assessment Upper Extremity Assessment Upper Extremity Assessment: Generalized weakness   Lower Extremity Assessment Lower Extremity Assessment: Defer to PT evaluation        Vision       Perception     Praxis      Cognition Arousal/Alertness: Awake/alert Behavior During Therapy: WFL for tasks assessed/performed, Anxious Overall Cognitive Status: Within Functional Limits for tasks assessed  Exercises      Shoulder Instructions       General Comments On 1L O2 at start of session initially SpO2 88 during coughing spell  improved to 93 at  rest. On RA during session and left on RA. Nurse present, SpO2 low 90s.    Pertinent Vitals/ Pain       Pain Assessment Pain Assessment: Faces Faces Pain Scale: Hurts little more Pain Location: chest from coughing Pain Descriptors /  Indicators: Discomfort Pain Intervention(s): Monitored during session  Home Living                                          Prior Functioning/Environment              Frequency  Min 2X/week        Progress Toward Goals  OT Goals(current goals can now be found in the care plan section)  Progress towards OT goals: Progressing toward goals  Acute Rehab OT Goals OT Goal Formulation: With patient Time For Goal Achievement: 06/18/22 Potential to Achieve Goals: Good ADL Goals Pt Will Perform Grooming: with min assist;standing Pt Will Perform Upper Body Bathing: with min assist;sitting Pt Will Perform Upper Body Dressing: with set-up;sitting Pt Will Transfer to Toilet: with min assist;ambulating;bedside commode Pt Will Perform Toileting - Clothing Manipulation and hygiene: with min assist;sit to/from stand Additional ADL Goal #1: pt will perform bed mobility with min assist in preparation for ADLs. Additional ADL Goal #2: Pt will state at least 3 energy conservation strategies as instructed.  Plan Discharge plan remains appropriate    Co-evaluation                 AM-PAC OT "6 Clicks" Daily Activity     Outcome Measure   Help from another person eating meals?: None Help from another person taking care of personal grooming?: A Little Help from another person toileting, which includes using toliet, bedpan, or urinal?: A Lot Help from another person bathing (including washing, rinsing, drying)?: A Lot Help from another person to put on and taking off regular upper body clothing?: A Little Help from another person to put on and taking off regular lower body clothing?: A Lot 6 Click Score: 16    End of Session Equipment Utilized During Treatment: Rolling walker (2 wheels);Oxygen (vs RA)  OT Visit Diagnosis: Unsteadiness on feet (R26.81);Other abnormalities of gait and mobility (R26.89);Muscle weakness (generalized) (M62.81);Pain   Activity  Tolerance Patient tolerated treatment well   Patient Left in chair;with call bell/phone within reach   Nurse Communication          Time: 5409-8119 OT Time Calculation (min): 33 min  Charges: OT General Charges $OT Visit: 1 Visit OT Treatments $Self Care/Home Management : 23-37 mins  Tyrone Schimke, OT Acute Rehabilitation Services Office: [phone]   [name] 06/09/2022, 12:30 PM

## 2022-06-10 ENCOUNTER — Inpatient Hospital Stay (HOSPITAL_COMMUNITY): Payer: Medicare Other

## 2022-06-10 DIAGNOSIS — R1110 Vomiting, unspecified: Secondary | ICD-10-CM | POA: Diagnosis not present

## 2022-06-10 DIAGNOSIS — I5033 Acute on chronic diastolic (congestive) heart failure: Secondary | ICD-10-CM | POA: Diagnosis not present

## 2022-06-10 DIAGNOSIS — E8770 Fluid overload, unspecified: Secondary | ICD-10-CM | POA: Diagnosis not present

## 2022-06-10 DIAGNOSIS — J9601 Acute respiratory failure with hypoxia: Secondary | ICD-10-CM | POA: Diagnosis not present

## 2022-06-10 LAB — BASIC METABOLIC PANEL
Anion gap: 8 (ref 5–15)
BUN: 21 mg/dL (ref 8–23)
CO2: 31 mmol/L (ref 22–32)
Calcium: 8.4 mg/dL — ABNORMAL LOW (ref 8.9–10.3)
Chloride: 105 mmol/L (ref 98–111)
Creatinine, Ser: 1.49 mg/dL — ABNORMAL HIGH (ref 0.44–1.00)
GFR, Estimated: 36 mL/min — ABNORMAL LOW (ref >60)
Glucose, Bld: 92 mg/dL (ref 70–99)
Potassium: 3.4 mmol/L — ABNORMAL LOW (ref 3.5–5.1)
Sodium: 144 mmol/L (ref 135–145)

## 2022-06-10 MED ORDER — PROCHLORPERAZINE EDISYLATE 10 MG/2ML IJ SOLN
10.0000 mg | Freq: Once | INTRAMUSCULAR | Status: AC
Start: 2022-06-10 — End: 2022-06-10
  Administered 2022-06-10: 10 mg via INTRAVENOUS
  Filled 2022-06-10: qty 2

## 2022-06-10 NOTE — Progress Notes (Signed)
Physical Therapy Treatment Patient Details Name: [name] MRN: [id number] DOB: 03-Oct-1941 Today's Date: 06/10/2022   History of Present Illness Pt is a 81 year old woman admitted from home on 06/03/22 with abdominal pain, n/v/d, sepsis. PMH: dCHF, CKD IIb, PAF on Eliquis, SSS s/p pacemaker, HTN, CAD, HLD, CVA, chronic LE wounds, depression, obesity.    PT Comments    Pt tearful upon PT arrival to room, states she just wants to be well and is tired of painful coughing. PT offered encouragement, upon reflection pt is pleased with her progress medically and mobility-wise over the past week. Pt tolerated short-distance gait and repeated sit<>stands during session, overall requiring light to moderate assist to complete struggling the most with returning to bed. Pt on RA throughout session in no distress. Pt remains motivated to d/c home with support of family.     Recommendations for follow up therapy are one component of a multi-disciplinary discharge planning process, led by the attending physician.  Recommendations may be updated based on patient status, additional functional criteria and insurance authorization.  Follow Up Recommendations  Home health PT Can patient physically be transported by private vehicle: Yes   Assistance Recommended at Discharge Frequent or constant Supervision/Assistance  Patient can return home with the following A lot of help with walking and/or transfers;A lot of help with bathing/dressing/bathroom   Equipment Recommendations  None recommended by PT    Recommendations for Other Services       Precautions / Restrictions Precautions Precautions: Fall Restrictions Weight Bearing Restrictions: No     Mobility  Bed Mobility Overal bed mobility: Needs Assistance Bed Mobility: Sit to Supine       Sit to supine: Mod assist, HOB elevated   General bed mobility comments: assist for LE progression into bed and boost up in bed     Transfers Overall transfer level: Needs assistance Equipment used: Rolling walker (2 wheels) Transfers: Sit to/from Stand Sit to Stand: Min assist           General transfer comment: light rise and steady assist from recliner. STS x2, from recliner and toilet    Ambulation/Gait Ambulation/Gait assistance: Min guard Gait Distance (Feet): 5 Feet Assistive device: Rolling walker (2 wheels) Gait Pattern/deviations: Step-through pattern, Decreased stride length, Wide base of support, Trunk flexed Gait velocity: decr     General Gait Details: light steadying and room navigation assist with RW, cues for navigation and RW use   Stairs             Wheelchair Mobility    Modified Rankin (Stroke Patients Only)       Balance Overall balance assessment: Needs assistance Sitting-balance support: Feet supported Sitting balance-Leahy Scale: Fair     Standing balance support: Bilateral upper extremity supported Standing balance-Leahy Scale: Poor                              Cognition Arousal/Alertness: Awake/alert Behavior During Therapy: WFL for tasks assessed/performed, Anxious Overall Cognitive Status: Within Functional Limits for tasks assessed                                 General Comments: tearful upon PT arrival to room, states she just wants to be well        Exercises      General Comments General comments (skin integrity, edema, etc.): on RA throughout  session      Pertinent Vitals/Pain Pain Assessment Pain Assessment: Faces Faces Pain Scale: Hurts little more Pain Location: chest from coughing Pain Descriptors / Indicators: Discomfort Pain Intervention(s): Limited activity within patient's tolerance, Monitored during session, Repositioned    Home Living                          Prior Function            PT Goals (current goals can now be found in the care plan section) Acute Rehab PT Goals Patient  Stated Goal: home PT Goal Formulation: With patient Time For Goal Achievement: 06/18/22 Potential to Achieve Goals: Good Progress towards PT goals: Progressing toward goals    Frequency    Min 3X/week      PT Plan Current plan remains appropriate    Co-evaluation              AM-PAC PT "6 Clicks" Mobility   Outcome Measure  Help needed turning from your back to your side while in a flat bed without using bedrails?: A Little Help needed moving from lying on your back to sitting on the side of a flat bed without using bedrails?: A Lot Help needed moving to and from a bed to a chair (including a wheelchair)?: A Lot Help needed standing up from a chair using your arms (e.g., wheelchair or bedside chair)?: A Little Help needed to walk in hospital room?: A Little Help needed climbing 3-5 steps with a railing? : A Lot 6 Click Score: 15    End of Session   Activity Tolerance: Patient tolerated treatment well Patient left: in bed;with call bell/phone within reach Nurse Communication: Mobility status PT Visit Diagnosis: Other abnormalities of gait and mobility (R26.89);Muscle weakness (generalized) (M62.81)     Time: 8921-1941 PT Time Calculation (min) (ACUTE ONLY): 23 min  Charges:  $Therapeutic Activity: 8-22 mins                     Stacie Glaze, PT DPT Acute Rehabilitation Services Pager [phone]  Office [phone]    Louis Matte 06/10/2022, 4:35 PM

## 2022-06-10 NOTE — Progress Notes (Signed)
Subjective:   Summary: [name] is a 81 y.o. year old female currently admitted on the IMTS HD#7 for iatrogenic volume overload due to IVF resuscitation of hypotensive hypovolemia resulting from viral GE.  Overnight Events: NOE   Upon examination today, pt was vomiting. This has been going on for the past couple days however seems to be increasing in frequency and intensity. States it happens when she eats. She is also complaining of leg pain do to her chronic ulcers.  Objective:  Vital signs in last 24 hours: Vitals:   06/10/22 0356 06/10/22 0800 06/10/22 0908 06/10/22 1200  BP: 138/70 (!) 143/65  (!) 115/97  Pulse: 68 62  74  Resp: 18 17  18   Temp: 98.3 F (36.8 C) 98.1 F (36.7 C)    TempSrc: Oral Oral    SpO2: 93% 91% 94% 99%  Weight:      Height:       Supplemental O2: Room Air O2- 95%   Physical Exam:  Constitutional: Obese woman sitting on couch Cardiovascular: RRR, no murmurs, rubs or gallops Pulmonary/Chest: normal work of breathing on room air, lungs clear to auscultation bilaterally Abdominal: Tenderness in the epigastric area, radiating to both sides Skin: warm and dry Extremities: upper/lower extremity pulses 2+, no lower extremity edema present  Filed Weights   06/06/22 0444 06/07/22 0416 06/09/22 0350  Weight: 117.8 kg (!) 179.6 kg (!) 175.3 kg     Intake/Output Summary (Last 24 hours) at 06/10/2022 1651 Last data filed at 06/10/2022 1415 Gross per 24 hour  Intake 180 ml  Output 1902 ml  Net -1722 ml   Net IO Since Admission: -12,513.96 mL [06/10/22 1651]  Pertinent Labs:    Latest Ref Rng & Units 06/08/2022    4:29 AM 06/07/2022    7:36 AM 06/06/2022    3:28 AM  CBC  WBC 4.0 - 10.5 K/uL 4.3  4.9  6.3   Hemoglobin 12.0 - 15.0 g/dL 11.5  11.0  11.7   Hematocrit 36.0 - 46.0 % 35.2  34.1  37.6   Platelets 150 - 400 K/uL 154  124  110        Latest Ref Rng & Units 06/10/2022    3:47 AM 06/09/2022   10:57 AM  06/08/2022    4:29 AM  CMP  Glucose 70 - 99 mg/dL 92  150  102   BUN 8 - 23 mg/dL 21  24  22    Creatinine 0.44 - 1.00 mg/dL 1.49  1.66  1.68   Sodium 135 - 145 mmol/L 144  142  143   Potassium 3.5 - 5.1 mmol/L 3.4  3.3  3.9   Chloride 98 - 111 mmol/L 105  102  104   CO2 22 - 32 mmol/L 31  31  34   Calcium 8.9 - 10.3 mg/dL 8.4  8.7  8.3     Assessment/Plan:   Active Problems:   Acute on chronic heart failure with preserved ejection fraction (HCC)   Sepsis (HCC)   Open wound of right lower leg   Diarrhea of presumed infectious origin   Patient Summary: [name] is a 81 y.o. 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    #Vomiting Due to Unknown Causes Pt has had intermittent  vomiting throughout her course of stay in the hospital. Prescribed a one time dose of prochlorperazine 10 mg IV, however seems it was ineffective as upon examination patient was having an emesis episode. Patient is afebrile, and initially came in with gastroenteritis. She also has abdominal pain in the epigastric area that radiates to the left and right that is tender to palpation. No masses where felt. She states the pain is about a 7/10. Upon examination, she had vomited about 300cc, however it was non-bloody.   Plan:  - KUB ordered, will review results when they return  - CBC pending  - Changed diet to clear liquid   #Acute Hypoxic Respiratory Failure #HFpEF, Volume Overloaded State Pt states her breathing is better. She was able to saturate well on room air today. She is currently on Torsemide 40 mg PO.   Plan:  -Continue monitoring I/O - Continue monitoring BMP  -O2 requirements monitoring  #Post Biopsy Trauma injury w/ Possible Venous Stasis Pt has leg pain and open ulcers on both lower extremities. Upon examination, there is granulation tissue  surrounding the areas, and active bleeding. Area is not warm, and no signs of infection on both legs.   Plan: -Continue doxycyline - Continue PT/OT to help patient ambulate Code: Full    Dispo: Anticipated discharge in less than two midnights  Drucie Opitz, MD PGY-1 Internal Medicine Resident Pager Number [phone] Please contact the on call pager after 5 pm and on weekends at [phone].

## 2022-06-10 NOTE — Plan of Care (Signed)
  Problem: Clinical Measurements: Goal: Ability to maintain clinical measurements within normal limits will improve Outcome: Progressing Goal: Will remain free from infection Outcome: Progressing Goal: Diagnostic test results will improve Outcome: Progressing Goal: Respiratory complications will improve Outcome: Progressing Goal: Cardiovascular complication will be avoided Outcome: Progressing   Problem: Elimination: Goal: Will not experience complications related to bowel motility Outcome: Progressing Goal: Will not experience complications related to urinary retention Outcome: Progressing   Problem: Safety: Goal: Ability to remain free from injury will improve Outcome: Progressing   Problem: Skin Integrity: Goal: Risk for impaired skin integrity will decrease Outcome: Progressing

## 2022-06-11 LAB — HEPATIC FUNCTION PANEL
ALT: 13 U/L (ref 0–44)
AST: 21 U/L (ref 15–41)
Albumin: 2.3 g/dL — ABNORMAL LOW (ref 3.5–5.0)
Alkaline Phosphatase: 62 U/L (ref 38–126)
Bilirubin, Direct: <0.1 mg/dL (ref 0.0–0.2)
Total Bilirubin: 0.6 mg/dL (ref 0.3–1.2)
Total Protein: 6.1 g/dL — ABNORMAL LOW (ref 6.5–8.1)

## 2022-06-11 LAB — BASIC METABOLIC PANEL
Anion gap: 9 (ref 5–15)
BUN: 18 mg/dL (ref 8–23)
CO2: 31 mmol/L (ref 22–32)
Calcium: 8.4 mg/dL — ABNORMAL LOW (ref 8.9–10.3)
Chloride: 102 mmol/L (ref 98–111)
Creatinine, Ser: 1.55 mg/dL — ABNORMAL HIGH (ref 0.44–1.00)
GFR, Estimated: 34 mL/min — ABNORMAL LOW (ref >60)
Glucose, Bld: 92 mg/dL (ref 70–99)
Potassium: 3 mmol/L — ABNORMAL LOW (ref 3.5–5.1)
Sodium: 142 mmol/L (ref 135–145)

## 2022-06-11 LAB — CBC WITH DIFFERENTIAL/PLATELET
Abs Immature Granulocytes: 0.03 10*3/uL (ref 0.00–0.07)
Basophils Absolute: 0.1 10*3/uL (ref 0.0–0.1)
Basophils Relative: 2 %
Eosinophils Absolute: 0.3 10*3/uL (ref 0.0–0.5)
Eosinophils Relative: 6 %
HCT: 34.3 % — ABNORMAL LOW (ref 36.0–46.0)
Hemoglobin: 11.2 g/dL — ABNORMAL LOW (ref 12.0–15.0)
Immature Granulocytes: 1 %
Lymphocytes Relative: 28 %
Lymphs Abs: 1.4 10*3/uL (ref 0.7–4.0)
MCH: 32.8 pg (ref 26.0–34.0)
MCHC: 32.7 g/dL (ref 30.0–36.0)
MCV: 100.6 fL — ABNORMAL HIGH (ref 80.0–100.0)
Monocytes Absolute: 0.6 10*3/uL (ref 0.1–1.0)
Monocytes Relative: 11 %
Neutro Abs: 2.8 10*3/uL (ref 1.7–7.7)
Neutrophils Relative %: 52 %
Platelets: 214 10*3/uL (ref 150–400)
RBC: 3.41 MIL/uL — ABNORMAL LOW (ref 3.87–5.11)
RDW: 17.2 % — ABNORMAL HIGH (ref 11.5–15.5)
WBC: 5.2 10*3/uL (ref 4.0–10.5)
nRBC: 0 % (ref 0.0–0.2)

## 2022-06-11 MED ORDER — POTASSIUM CHLORIDE CRYS ER 20 MEQ PO TBCR
40.0000 meq | EXTENDED_RELEASE_TABLET | Freq: Two times a day (BID) | ORAL | Status: AC
Start: 2022-06-11 — End: 2022-06-11
  Administered 2022-06-11 (×2): 40 meq via ORAL
  Filled 2022-06-11 (×2): qty 2

## 2022-06-11 MED ORDER — PROCHLORPERAZINE EDISYLATE 10 MG/2ML IJ SOLN
10.0000 mg | Freq: Once | INTRAMUSCULAR | Status: AC | PRN
  Administered 2022-06-11: 10 mg via INTRAVENOUS
  Filled 2022-06-11: qty 2

## 2022-06-11 MED ORDER — POTASSIUM CHLORIDE 10 MEQ/100ML IV SOLN: 10.0000 meq | INTRAVENOUS | Status: DC

## 2022-06-11 MED ORDER — POTASSIUM CHLORIDE 10 MEQ/100ML IV SOLN
10.0000 meq | Freq: Once | INTRAVENOUS | Status: AC
  Administered 2022-06-11: 10 meq via INTRAVENOUS
  Filled 2022-06-11: qty 100

## 2022-06-11 NOTE — Consult Note (Signed)
Reason for Consult: Persistent nausea/vomiting Referring Physician: Teaching Service  Iran Sizer HPI: This is a 81 year old female with a PMH of HFpEF, HTN, SSS, OSA, CKD, CAD, hyperlipidemia, GERD, and paroxysmal atrial fibrillation originally admitted for nausea, vomiting, and diarrhea.  Over the course of her hospitalization her GI symptoms waxed and waned.  She was being treated for her CHF with gentle diuresis.  Intermittently she suffered with SOB and her oxygen requirement increased significantly, but she is currently on room air.  Per the notes, the patient was initially worked up for her GI issues with a GI panel and the results were negative, however, there is no test result in the system.  With regards to her nausea and vomiting, she states that it started one month ago at home, but she did report that she was supposed to see a GI for an EGD.  However, she never had the procedure for unclear reasons.  If she does not eat she does not have any issues with nausea or vomiting.  Anytime she attempts PO intake she will vomit the ingested food or beverage 1-2 minutes after the meal.  Even though these symptoms are chronic, she is still morbidly obese.  Her A1C during this admission was at 5.8%.  She does not report any obvious GERD issues and she does not experience dysphagia.  Past Medical History:  Diagnosis Date   Arthritis    CHF (congestive heart failure) ([place])    05/2011 echo severe concentric LVH, normal systolic fxn, grade 1 diastolic dysfxn, paradoxical septal motion   Chronic kidney disease-mineral and bone disorder 05/20/2020   Coronary artery disease    Cath 04/2006 normal coronaries, mild-mod slow flow in all coronaries with slow distall runoff, mild-mod LV enlargement, EF 55%    Depression    Dyspnea    Dysrhythmia    GERD (gastroesophageal reflux disease)    Hypercholesterolemia    Hyperlipemia    Hypertension    MI (myocardial infarction) ([place])    NOT SURE HOW MANY    Morbid obesity ([place])    Nocturia    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   Peripheral vascular disease ([place])    Pneumonia NONE RECENT   Sleep apnea    needs cpap but does not wear   Stroke [facility])    TIA (transient ischemic attack)    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    Vertigo    takes meclizine daily    Past Surgical History:  Procedure Laterality Date   ABDOMINAL HYSTERECTOMY     CARDIAC CATHETERIZATION     CATARACT EXTRACTION W/PHACO Left 08/29/2013   Procedure: CATARACT EXTRACTION PHACO AND INTRAOCULAR LENS PLACEMENT ([place]);  Surgeon: Adonis Brook, MD;  Location: [place];  Service: Ophthalmology;  Laterality: Left;   COLONOSCOPY WITH PROPOFOL N/A 01/07/2014   Procedure: COLONOSCOPY WITH PROPOFOL;  Surgeon: Juanita Craver, MD;  Location: [facility] ENDOSCOPY;  Service: Endoscopy;  Laterality: N/A;   CORONARY ANGIOGRAPHY N/A 05/27/2020   Procedure: CORONARY ANGIOGRAPHY;  Surgeon: Dixie Dials, MD;  Location: [place] CV LAB;  Service: Cardiovascular;  Laterality: N/A;   DILATION AND CURETTAGE OF UTERUS     INSERT / REPLACE / REMOVE PACEMAKER     LEFT HEART CATH AND CORONARY ANGIOGRAPHY N/A 07/18/2020   Procedure: LEFT HEART CATH AND CORONARY ANGIOGRAPHY;  Surgeon: Dixie Dials, MD;  Location: [place] CV LAB;  Service: Cardiovascular;  Laterality: N/A;   PACEMAKER GENERATOR CHANGE N/A  03/27/2014   Procedure: PACEMAKER GENERATOR CHANGE;  Surgeon: Sanda Klein, MD;  Location: [place] CATH LAB;  Service: Cardiovascular;  Laterality: N/A;   PACEMAKER INSERTION     PACEMAKER INSERTION     TUBAL LIGATION     VASCULAR SURGERY     vericose vein surgery    History reviewed. No pertinent family history.  Social History:  reports that she has never smoked. She has never used smokeless tobacco. She reports that she does not drink alcohol and does not use drugs.  Allergies:  Allergies  Allergen Reactions   Omnipen [Ampicillin] Other (See Comments)    Increases heart  rhythm    Penicillins Other (See Comments)    Increases heart rhythm    Linzess [Linaclotide] Other (See Comments)    Severe stomach pains    Medications: Scheduled:  amiodarone  200 mg Oral Daily   apixaban  5 mg Oral BID   cyanocobalamin  1,000 mcg Oral Daily   doxycycline  100 mg Oral Q12H   ipratropium-albuterol  3 mL Nebulization BID   oxybutynin  5 mg Oral BID   pantoprazole  40 mg Oral BID   polyethylene glycol  17 g Oral Daily   potassium chloride  40 mEq Oral BID   rosuvastatin  10 mg Oral Daily   senna-docusate  1 tablet Oral QHS   torsemide  40 mg Oral Daily   Continuous:  sodium chloride Stopped (06/04/22 0246)    Results for orders placed or performed during the hospital encounter of 06/03/22 (from the past 24 hour(s))  Basic metabolic panel     Status: Abnormal   Collection Time: 06/11/22  4:10 AM  Result Value Ref Range   Sodium 142 135 - 145 mmol/L   Potassium 3.0 (L) 3.5 - 5.1 mmol/L   Chloride 102 98 - 111 mmol/L   CO2 31 22 - 32 mmol/L   Glucose, Bld 92 70 - 99 mg/dL   BUN 18 8 - 23 mg/dL   Creatinine, Ser 1.55 (H) 0.44 - 1.00 mg/dL   Calcium 8.4 (L) 8.9 - 10.3 mg/dL   GFR, Estimated 34 (L) >60 mL/min   Anion gap 9 5 - 15  CBC with Differential/Platelet     Status: Abnormal   Collection Time: 06/11/22  4:10 AM  Result Value Ref Range   WBC 5.2 4.0 - 10.5 K/uL   RBC 3.41 (L) 3.87 - 5.11 MIL/uL   Hemoglobin 11.2 (L) 12.0 - 15.0 g/dL   HCT 34.3 (L) 36.0 - 46.0 %   MCV 100.6 (H) 80.0 - 100.0 fL   MCH 32.8 26.0 - 34.0 pg   MCHC 32.7 30.0 - 36.0 g/dL   RDW 17.2 (H) 11.5 - 15.5 %   Platelets 214 150 - 400 K/uL   nRBC 0.0 0.0 - 0.2 %   Neutrophils Relative % 52 %   Neutro Abs 2.8 1.7 - 7.7 K/uL   Lymphocytes Relative 28 %   Lymphs Abs 1.4 0.7 - 4.0 K/uL   Monocytes Relative 11 %   Monocytes Absolute 0.6 0.1 - 1.0 K/uL   Eosinophils Relative 6 %   Eosinophils Absolute 0.3 0.0 - 0.5 K/uL   Basophils Relative 2 %   Basophils Absolute 0.1 0.0 -  0.1 K/uL   Immature Granulocytes 1 %   Abs Immature Granulocytes 0.03 0.00 - 0.07 K/uL  Hepatic function panel     Status: Abnormal   Collection Time: 06/11/22  4:10 AM  Result Value Ref  Range   Total Protein 6.1 (L) 6.5 - 8.1 g/dL   Albumin 2.3 (L) 3.5 - 5.0 g/dL   AST 21 15 - 41 U/L   ALT 13 0 - 44 U/L   Alkaline Phosphatase 62 38 - 126 U/L   Total Bilirubin 0.6 0.3 - 1.2 mg/dL   Bilirubin, Direct <0.1 0.0 - 0.2 mg/dL   Indirect Bilirubin NOT CALCULATED 0.3 - 0.9 mg/dL     DG Abd 1 View  Result Date: 06/10/2022 CLINICAL DATA:  Persistent vomiting with abdominal pain. EXAM: ABDOMEN - 1 VIEW COMPARISON:  CT abdomen and pelvis 06/03/2022. FINDINGS: The bowel gas pattern is normal. Phleboliths are seen in the pelvis. No acute fractures are identified. Pacemaker wire partially visualized. IMPRESSION: Nonobstructive, nonspecific bowel gas pattern. Electronically Signed   By: Ronney Asters M.D.   On: 06/10/2022 18:41    ROS:  As stated above in the HPI otherwise negative.  Blood pressure 128/85, pulse 85, temperature 98.6 F (37 C), temperature source Oral, resp. rate 18, height 5\' 7"  (1.702 m), weight (!) 175.2 kg, SpO2 96 %.    PE: Gen: NAD, Alert and Oriented HEENT:  [place]/AT, EOMI Neck: Supple, no LAD Lungs: CTA Bilaterally CV: RRR without M/G/R ABD: Soft, NTND, +BS, morbidly obese Ext: No C/C/E  Assessment/Plan: 1) Nausea/vomiting. 2) Diarrhea - resolved at this time. 3) CHF. 4) SOB - controlled.  She is on room air.   The source of her post prandial nausea and vomiting is not forthcoming.  In spite of her symptoms she remains morbidly obese.  There is no evidence that she has gastroparesis, but in her situation it can be idiopathic.  A GOO can induce similar symptoms, but the admission CT scan was negative for this issue.  It is reasonable to perform a diagnostic EGD to ascertain if there is any gross luminal or mucosal explanation for her symptoms.  Plan: 1) EGD  tomorrow. 2) Hold Apixaban. 3) Continue with pantoprazole. 4) If the EGD is negative, then a gastric emptying scan can be tried.  Marjie Chea D 06/11/2022, 3:20 PM

## 2022-06-11 NOTE — Progress Notes (Signed)
[facility] consult received for PIV placement. Assess bilateral arms for PIV placement. Veins deep/branching. Pt requested that she not be stuck. This nurse notified RN. Fran Lowes, RN [facility]

## 2022-06-11 NOTE — H&P (View-Only) (Signed)
Reason for Consult: Persistent nausea/vomiting Referring Physician: Teaching Service  Iran Sizer HPI: This is a 81 year old female with a PMH of HFpEF, HTN, SSS, OSA, CKD, CAD, hyperlipidemia, GERD, and paroxysmal atrial fibrillation originally admitted for nausea, vomiting, and diarrhea.  Over the course of her hospitalization her GI symptoms waxed and waned.  She was being treated for her CHF with gentle diuresis.  Intermittently she suffered with SOB and her oxygen requirement increased significantly, but she is currently on room air.  Per the notes, the patient was initially worked up for her GI issues with a GI panel and the results were negative, however, there is no test result in the system.  With regards to her nausea and vomiting, she states that it started one month ago at home, but she did report that she was supposed to see a GI for an EGD.  However, she never had the procedure for unclear reasons.  If she does not eat she does not have any issues with nausea or vomiting.  Anytime she attempts PO intake she will vomit the ingested food or beverage 1-2 minutes after the meal.  Even though these symptoms are chronic, she is still morbidly obese.  Her A1C during this admission was at 5.8%.  She does not report any obvious GERD issues and she does not experience dysphagia.  Past Medical History:  Diagnosis Date   Arthritis    CHF (congestive heart failure) ([place])    05/2011 echo severe concentric LVH, normal systolic fxn, grade 1 diastolic dysfxn, paradoxical septal motion   Chronic kidney disease-mineral and bone disorder 05/20/2020   Coronary artery disease    Cath 04/2006 normal coronaries, mild-mod slow flow in all coronaries with slow distall runoff, mild-mod LV enlargement, EF 55%    Depression    Dyspnea    Dysrhythmia    GERD (gastroesophageal reflux disease)    Hypercholesterolemia    Hyperlipemia    Hypertension    MI (myocardial infarction) ([place])    NOT SURE HOW MANY    Morbid obesity ([place])    Nocturia    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   Peripheral vascular disease ([place])    Pneumonia NONE RECENT   Sleep apnea    needs cpap but does not wear   Stroke [facility])    TIA (transient ischemic attack)    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    Vertigo    takes meclizine daily    Past Surgical History:  Procedure Laterality Date   ABDOMINAL HYSTERECTOMY     CARDIAC CATHETERIZATION     CATARACT EXTRACTION W/PHACO Left 08/29/2013   Procedure: CATARACT EXTRACTION PHACO AND INTRAOCULAR LENS PLACEMENT ([place]);  Surgeon: Adonis Brook, MD;  Location: [place];  Service: Ophthalmology;  Laterality: Left;   COLONOSCOPY WITH PROPOFOL N/A 01/07/2014   Procedure: COLONOSCOPY WITH PROPOFOL;  Surgeon: Juanita Craver, MD;  Location: [facility];  Service: Endoscopy;  Laterality: N/A;   CORONARY ANGIOGRAPHY N/A 05/27/2020   Procedure: CORONARY ANGIOGRAPHY;  Surgeon: Dixie Dials, MD;  Location: [place] [facility];  Service: Cardiovascular;  Laterality: N/A;   DILATION AND CURETTAGE OF UTERUS     INSERT / REPLACE / REMOVE PACEMAKER     LEFT HEART CATH AND CORONARY ANGIOGRAPHY N/A 07/18/2020   Procedure: LEFT HEART CATH AND CORONARY ANGIOGRAPHY;  Surgeon: Dixie Dials, MD;  Location: [place] [facility];  Service: Cardiovascular;  Laterality: N/A;   PACEMAKER GENERATOR CHANGE N/A  03/27/2014   Procedure: PACEMAKER GENERATOR CHANGE;  Surgeon: Sanda Klein, MD;  Location: [facility];  Service: Cardiovascular;  Laterality: N/A;   PACEMAKER INSERTION     PACEMAKER INSERTION     TUBAL LIGATION     VASCULAR SURGERY     vericose vein surgery    History reviewed. No pertinent family history.  Social History:  reports that she has never smoked. She has never used smokeless tobacco. She reports that she does not drink alcohol and does not use drugs.  Allergies:  Allergies  Allergen Reactions   Omnipen [Ampicillin] Other (See Comments)    Increases heart  rhythm    Penicillins Other (See Comments)    Increases heart rhythm    Linzess [Linaclotide] Other (See Comments)    Severe stomach pains    Medications: Scheduled:  amiodarone  200 mg Oral Daily   apixaban  5 mg Oral BID   cyanocobalamin  1,000 mcg Oral Daily   doxycycline  100 mg Oral Q12H   ipratropium-albuterol  3 mL Nebulization BID   oxybutynin  5 mg Oral BID   pantoprazole  40 mg Oral BID   polyethylene glycol  17 g Oral Daily   potassium chloride  40 mEq Oral BID   rosuvastatin  10 mg Oral Daily   senna-docusate  1 tablet Oral QHS   torsemide  40 mg Oral Daily   Continuous:  sodium chloride Stopped (06/04/22 0246)    Results for orders placed or performed during the hospital encounter of 06/03/22 (from the past 24 hour(s))  Basic metabolic panel     Status: Abnormal   Collection Time: 06/11/22  4:10 AM  Result Value Ref Range   Sodium 142 135 - 145 mmol/L   Potassium 3.0 (L) 3.5 - 5.1 mmol/L   Chloride 102 98 - 111 mmol/L   CO2 31 22 - 32 mmol/L   Glucose, Bld 92 70 - 99 mg/dL   BUN 18 8 - 23 mg/dL   Creatinine, Ser 1.55 (H) 0.44 - 1.00 mg/dL   Calcium 8.4 (L) 8.9 - 10.3 mg/dL   GFR, Estimated 34 (L) >60 mL/min   Anion gap 9 5 - 15  CBC with Differential/Platelet     Status: Abnormal   Collection Time: 06/11/22  4:10 AM  Result Value Ref Range   WBC 5.2 4.0 - 10.5 K/uL   RBC 3.41 (L) 3.87 - 5.11 MIL/uL   Hemoglobin 11.2 (L) 12.0 - 15.0 g/dL   HCT 34.3 (L) 36.0 - 46.0 %   MCV 100.6 (H) 80.0 - 100.0 fL   MCH 32.8 26.0 - 34.0 pg   MCHC 32.7 30.0 - 36.0 g/dL   RDW 17.2 (H) 11.5 - 15.5 %   Platelets 214 150 - 400 K/uL   nRBC 0.0 0.0 - 0.2 %   Neutrophils Relative % 52 %   Neutro Abs 2.8 1.7 - 7.7 K/uL   Lymphocytes Relative 28 %   Lymphs Abs 1.4 0.7 - 4.0 K/uL   Monocytes Relative 11 %   Monocytes Absolute 0.6 0.1 - 1.0 K/uL   Eosinophils Relative 6 %   Eosinophils Absolute 0.3 0.0 - 0.5 K/uL   Basophils Relative 2 %   Basophils Absolute 0.1 0.0 -  0.1 K/uL   Immature Granulocytes 1 %   Abs Immature Granulocytes 0.03 0.00 - 0.07 K/uL  Hepatic function panel     Status: Abnormal   Collection Time: 06/11/22  4:10 AM  Result Value Ref  Range   Total Protein 6.1 (L) 6.5 - 8.1 g/dL   Albumin 2.3 (L) 3.5 - 5.0 g/dL   AST 21 15 - 41 U/L   ALT 13 0 - 44 U/L   Alkaline Phosphatase 62 38 - 126 U/L   Total Bilirubin 0.6 0.3 - 1.2 mg/dL   Bilirubin, Direct <0.1 0.0 - 0.2 mg/dL   Indirect Bilirubin NOT CALCULATED 0.3 - 0.9 mg/dL     DG Abd 1 View  Result Date: 06/10/2022 CLINICAL DATA:  Persistent vomiting with abdominal pain. EXAM: ABDOMEN - 1 VIEW COMPARISON:  CT abdomen and pelvis 06/03/2022. FINDINGS: The bowel gas pattern is normal. Phleboliths are seen in the pelvis. No acute fractures are identified. Pacemaker wire partially visualized. IMPRESSION: Nonobstructive, nonspecific bowel gas pattern. Electronically Signed   By: Ronney Asters M.D.   On: 06/10/2022 18:41    ROS:  As stated above in the HPI otherwise negative.  Blood pressure 128/85, pulse 85, temperature 98.6 F (37 C), temperature source Oral, resp. rate 18, height 5\' 7"  (1.702 m), weight (!) 175.2 kg, SpO2 96 %.    PE: Gen: NAD, Alert and Oriented HEENT:  [place]/AT, EOMI Neck: Supple, no LAD Lungs: CTA Bilaterally CV: RRR without M/G/R ABD: Soft, NTND, +BS, morbidly obese Ext: No C/C/E  Assessment/Plan: 1) Nausea/vomiting. 2) Diarrhea - resolved at this time. 3) CHF. 4) SOB - controlled.  She is on room air.   The source of her post prandial nausea and vomiting is not forthcoming.  In spite of her symptoms she remains morbidly obese.  There is no evidence that she has gastroparesis, but in her situation it can be idiopathic.  A GOO can induce similar symptoms, but the admission CT scan was negative for this issue.  It is reasonable to perform a diagnostic EGD to ascertain if there is any gross luminal or mucosal explanation for her symptoms.  Plan: 1) EGD  tomorrow. 2) Hold Apixaban. 3) Continue with pantoprazole. 4) If the EGD is negative, then a gastric emptying scan can be tried.  Jaylean Buenaventura D 06/11/2022, 3:20 PM

## 2022-06-11 NOTE — Progress Notes (Signed)
Occupational Therapy Treatment Patient Details Name: [name] MRN: [id number] DOB: January 08, 1941 Today's Date: 06/11/2022   History of present illness Pt is a 81 year old woman admitted from home on 06/03/22 with abdominal pain, n/v/d, sepsis. PMH: dCHF, CKD IIb, PAF on Eliquis, SSS s/p pacemaker, HTN, CAD, HLD, CVA, chronic LE wounds, depression, obesity.   OT comments  Pt continues to progress. Min assist needed for bed mobility, to stand and transfer x 2 with RW. Total assist for LB dressing and pericare, mod assist for UB bathing, min assist for UB dressing and pt set up for grooming at chair level. Pt grateful to be OOB with purewick having failed.    Recommendations for follow up therapy are one component of a multi-disciplinary discharge planning process, led by the attending physician.  Recommendations may be updated based on patient status, additional functional criteria and insurance authorization.    Follow Up Recommendations  Home health OT    Assistance Recommended at Discharge Frequent or constant Supervision/Assistance  Patient can return home with the following  A lot of help with bathing/dressing/bathroom;Assistance with cooking/housework;Direct supervision/assist for financial management;Assist for transportation;Help with stairs or ramp for entrance;A little help with walking and/or transfers   Equipment Recommendations  BSC/3in1;Wheelchair (measurements OT);Wheelchair cushion (measurements OT)    Recommendations for Other Services      Precautions / Restrictions Precautions Precautions: Fall Restrictions Weight Bearing Restrictions: No       Mobility Bed Mobility Overal bed mobility: Needs Assistance Bed Mobility: Supine to Sit       Sit to supine: Min assist, HOB elevated   General bed mobility comments: assist for L hip to EOB with bed pad, + use of rail    Transfers Overall transfer level: Needs assistance Equipment used: Rolling walker  (2 wheels) Transfers: Sit to/from Stand, Bed to chair/wheelchair/BSC Sit to Stand: Min assist     Step pivot transfers: Min assist     General transfer comment: light rise and steady assist from recliner. STS x2, from recliner and toilet     Balance Overall balance assessment: Needs assistance   Sitting balance-Leahy Scale: Fair     Standing balance support: Bilateral upper extremity supported Standing balance-Leahy Scale: Poor                             ADL either performed or assessed with clinical judgement   ADL Overall ADL's : Needs assistance/impaired     Grooming: Wash/dry hands;Wash/dry face;Oral care;Sitting   Upper Body Bathing: Moderate assistance;Sitting       Upper Body Dressing : Minimal assistance;Sitting   Lower Body Dressing: Total assistance;Bed level   Toilet Transfer: Min guard;Stand-pivot;Rolling walker (2 wheels);BSC/3in1   Toileting- Clothing Manipulation and Hygiene: Total assistance;Sit to/from stand              Extremity/Trunk Assessment              Vision       Perception     Praxis      Cognition Arousal/Alertness: Awake/alert Behavior During Therapy: WFL for tasks assessed/performed, Anxious Overall Cognitive Status: Within Functional Limits for tasks assessed                                          Exercises      Shoulder Instructions  General Comments      Pertinent Vitals/ Pain       Pain Assessment Pain Assessment: Faces Faces Pain Scale: Hurts little more Pain Location: generalized Pain Descriptors / Indicators: Discomfort, Grimacing Pain Intervention(s): Monitored during session, Repositioned  Home Living                                          Prior Functioning/Environment              Frequency  Min 2X/week        Progress Toward Goals  OT Goals(current goals can now be found in the care plan section)  Progress towards OT  goals: Progressing toward goals  Acute Rehab OT Goals OT Goal Formulation: With patient Time For Goal Achievement: 06/18/22 Potential to Achieve Goals: Good  Plan Discharge plan remains appropriate    Co-evaluation                 AM-PAC OT "6 Clicks" Daily Activity     Outcome Measure   Help from another person eating meals?: None Help from another person taking care of personal grooming?: A Little Help from another person toileting, which includes using toliet, bedpan, or urinal?: Total Help from another person bathing (including washing, rinsing, drying)?: A Lot Help from another person to put on and taking off regular upper body clothing?: A Little Help from another person to put on and taking off regular lower body clothing?: Total 6 Click Score: 14    End of Session Equipment Utilized During Treatment: Rolling walker (2 wheels)  OT Visit Diagnosis: Unsteadiness on feet (R26.81);Other abnormalities of gait and mobility (R26.89);Muscle weakness (generalized) (M62.81);Pain   Activity Tolerance Patient tolerated treatment well   Patient Left in chair;with call bell/phone within reach   Nurse Communication Mobility status;Other (comment) (needs purewick replaced)        Time: 0110-0349 OT Time Calculation (min): 28 min  Charges: OT General Charges $OT Visit: 1 Visit OT Treatments $Self Care/Home Management : 23-37 mins  [name], OTR/L Acute Rehabilitation Services Office: [phone]   [name] 06/11/2022, 1:27 PM

## 2022-06-11 NOTE — Progress Notes (Signed)
Subjective:   Summary: [name] is a 81 y.o. year old female currently admitted on the IMTS HD#8 for vomiting and abdominal pain due to unknown cause.  Overnight Events: NOE   Pt states vomiting has gotten better with her clear liquid diet. She is still nauseous, and complaining of leg pain.   Objective:  Vital signs in last 24 hours: Vitals:   06/11/22 0442 06/11/22 0816 06/11/22 0835 06/11/22 0900  BP:   128/85   Pulse:  64 85   Resp:  18    Temp:   98.6 F (37 C)   TempSrc:   Oral   SpO2:  94%  96%  Weight: (!) 175.2 kg     Height:       Supplemental O2: Room Air SpO2: 96 % O2 Flow Rate (L/min): 3 L/min   Physical Exam:  Constitutional: Obese woman sitting on couch Cardiovascular: RRR, no murmurs, rubs or gallops Pulmonary/Chest: normal work of breathing on room air, lungs clear to auscultation bilaterally Abdominal: Tenderness in epigastric area, radiating to both sides Skin: warm and dry Extremities: Chronic venous ulcers on Bilateral lower extremities, currently wrapped with kerlix and ace wraps  Filed Weights   06/07/22 0416 06/09/22 0350 06/11/22 0442  Weight: (!) 179.6 kg (!) 175.3 kg (!) 175.2 kg     Intake/Output Summary (Last 24 hours) at 06/11/2022 1330 Last data filed at 06/11/2022 0500 Gross per 24 hour  Intake 597 ml  Output 1350 ml  Net -753 ml   Net IO Since Admission: -12,966.96 mL [06/11/22 1330]  Pertinent Labs:    Latest Ref Rng & Units 06/11/2022    4:10 AM 06/08/2022    4:29 AM 06/07/2022    7:36 AM  CBC  WBC 4.0 - 10.5 K/uL 5.2  4.3  4.9   Hemoglobin 12.0 - 15.0 g/dL 11.2  11.5  11.0   Hematocrit 36.0 - 46.0 % 34.3  35.2  34.1   Platelets 150 - 400 K/uL 214  154  124        Latest Ref Rng & Units 06/11/2022    4:10 AM 06/10/2022    3:47 AM 06/09/2022   10:57 AM  CMP  Glucose 70 - 99 mg/dL 92  92  150   BUN 8 - 23 mg/dL 18  21  24    Creatinine 0.44 - 1.00 mg/dL 1.55  1.49  1.66   Sodium 135 -  145 mmol/L 142  144  142   Potassium 3.5 - 5.1 mmol/L 3.0  3.4  3.3   Chloride 98 - 111 mmol/L 102  105  102   CO2 22 - 32 mmol/L 31  31  31    Calcium 8.9 - 10.3 mg/dL 8.4  8.4  8.7   Total Protein 6.5 - 8.1 g/dL 6.1  C    Total Bilirubin 0.3 - 1.2 mg/dL 0.6  C    Alkaline Phos 38 - 126 U/L 62  C    AST 15 - 41 U/L 21  C    ALT 0 - 44 U/L 13  C      C Corrected result    Imaging: DG Abd 1 View  Result Date: 06/10/2022 CLINICAL DATA:  Persistent vomiting with abdominal pain. EXAM: ABDOMEN - 1 VIEW COMPARISON:  CT abdomen and pelvis 06/03/2022. FINDINGS: The bowel gas pattern is normal. Phleboliths are seen in the pelvis.  No acute fractures are identified. Pacemaker wire partially visualized. IMPRESSION: Nonobstructive, nonspecific bowel gas pattern. Electronically Signed   By: Ronney Asters M.D.   On: 06/10/2022 18:41     Assessment/Plan:   Active Problems:   Acute on chronic heart failure with preserved ejection fraction ([place])   Sepsis ([place])   Open wound of right lower leg   Diarrhea of presumed infectious origin   Patient Summary: [name] is a 81 y.o. 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    #Vomiting Due to Unknown Causes #Hypokalemia After asking patient more about her vomiting, and seems like this has been a chronic issue. There is no difference between emesis at home and this emesis. KUB resulted in nonobstructive, nonspecific bowel gas. Pt underwent colonoscopy in 2015 with [facility]. We have treated her for GERD/PUD however her symptoms have persisted.   At first, we suspected this was due to gastroenteritis, however symptoms have persisted throughout her hospital course. At this point, we are considering all possibilities and requested consult from gastroenterology.  Pt's potassium today was 3.0,  likely from excessive emesis. Due to patients inability to tolerate PO at this time we will give IV Potassium to replete   Plan:  - GI Consult placed - CT Abdomen ordered  - IV Potassium    #Acute Hypoxic Respiratory Failure (Resolved) #HFpEF, Volume Overloaded State Pt denies shortness of breath today, and is able to saturate well on room air. Plan: - Continue monitoring I/O, BMP, O2 requirements - Continue Torsemide 40 mg PO  #Post Biopsy Trauma Injury w/ [place] Venous Stasis  Pt is still having pain in her legs. Wound care has been following with her to make sure both sites are clean.  -Continue Doxycyline -Continue PT/OT  Code: Full    Dispo: Anticipated discharge in less than two midnights  Drucie Opitz, MD PGY-1 Internal Medicine Resident Pager Number [phone] Please contact the on call pager after 5 pm and on weekends at [phone].

## 2022-06-12 ENCOUNTER — Encounter (HOSPITAL_COMMUNITY)
Admission: EM | Disposition: A | Discharge status: Home Health | Payer: Self-pay | Source: Home / Self Care | Attending: Internal Medicine

## 2022-06-12 ENCOUNTER — Inpatient Hospital Stay (HOSPITAL_COMMUNITY): Payer: Medicare Other | Admitting: Certified Registered Nurse Anesthetist

## 2022-06-12 ENCOUNTER — Encounter (HOSPITAL_COMMUNITY): Payer: Self-pay | Admitting: Internal Medicine

## 2022-06-12 ENCOUNTER — Inpatient Hospital Stay (HOSPITAL_COMMUNITY): Payer: Medicare Other

## 2022-06-12 DIAGNOSIS — S81801A Unspecified open wound, right lower leg, initial encounter: Secondary | ICD-10-CM

## 2022-06-12 DIAGNOSIS — I130 Hypertensive heart and chronic kidney disease with heart failure and stage 1 through stage 4 chronic kidney disease, or unspecified chronic kidney disease: Secondary | ICD-10-CM

## 2022-06-12 DIAGNOSIS — K259 Gastric ulcer, unspecified as acute or chronic, without hemorrhage or perforation: Secondary | ICD-10-CM

## 2022-06-12 DIAGNOSIS — I509 Heart failure, unspecified: Secondary | ICD-10-CM

## 2022-06-12 DIAGNOSIS — E876 Hypokalemia: Secondary | ICD-10-CM

## 2022-06-12 DIAGNOSIS — D631 Anemia in chronic kidney disease: Secondary | ICD-10-CM

## 2022-06-12 DIAGNOSIS — K319 Disease of stomach and duodenum, unspecified: Secondary | ICD-10-CM

## 2022-06-12 DIAGNOSIS — K2100 Gastro-esophageal reflux disease with esophagitis, without bleeding: Secondary | ICD-10-CM

## 2022-06-12 DIAGNOSIS — K209 Esophagitis, unspecified without bleeding: Secondary | ICD-10-CM | POA: Diagnosis not present

## 2022-06-12 DIAGNOSIS — I252 Old myocardial infarction: Secondary | ICD-10-CM | POA: Diagnosis not present

## 2022-06-12 DIAGNOSIS — I2510 Atherosclerotic heart disease of native coronary artery without angina pectoris: Secondary | ICD-10-CM

## 2022-06-12 DIAGNOSIS — N189 Chronic kidney disease, unspecified: Secondary | ICD-10-CM

## 2022-06-12 DIAGNOSIS — S8992XA Unspecified injury of left lower leg, initial encounter: Secondary | ICD-10-CM

## 2022-06-12 DIAGNOSIS — R1110 Vomiting, unspecified: Secondary | ICD-10-CM

## 2022-06-12 DIAGNOSIS — S8991XA Unspecified injury of right lower leg, initial encounter: Secondary | ICD-10-CM

## 2022-06-12 HISTORY — PX: BIOPSY: SHX5522

## 2022-06-12 HISTORY — PX: ESOPHAGOGASTRODUODENOSCOPY (EGD) WITH PROPOFOL: SHX5813

## 2022-06-12 LAB — CBC
HCT: 35.2 % — ABNORMAL LOW (ref 36.0–46.0)
Hemoglobin: 11.6 g/dL — ABNORMAL LOW (ref 12.0–15.0)
MCH: 33 pg (ref 26.0–34.0)
MCHC: 33 g/dL (ref 30.0–36.0)
MCV: 100 fL (ref 80.0–100.0)
Platelets: 233 10*3/uL (ref 150–400)
RBC: 3.52 MIL/uL — ABNORMAL LOW (ref 3.87–5.11)
RDW: 17 % — ABNORMAL HIGH (ref 11.5–15.5)
WBC: 5 10*3/uL (ref 4.0–10.5)
nRBC: 0 % (ref 0.0–0.2)

## 2022-06-12 LAB — BASIC METABOLIC PANEL
Anion gap: 10 (ref 5–15)
BUN: 13 mg/dL (ref 8–23)
CO2: 30 mmol/L (ref 22–32)
Calcium: 8.5 mg/dL — ABNORMAL LOW (ref 8.9–10.3)
Chloride: 102 mmol/L (ref 98–111)
Creatinine, Ser: 1.48 mg/dL — ABNORMAL HIGH (ref 0.44–1.00)
GFR, Estimated: 36 mL/min — ABNORMAL LOW (ref >60)
Glucose, Bld: 91 mg/dL (ref 70–99)
Potassium: 3.4 mmol/L — ABNORMAL LOW (ref 3.5–5.1)
Sodium: 142 mmol/L (ref 135–145)

## 2022-06-12 LAB — MAGNESIUM: Magnesium: 1.8 mg/dL (ref 1.7–2.4)

## 2022-06-12 SURGERY — ESOPHAGOGASTRODUODENOSCOPY (EGD) WITH PROPOFOL
Anesthesia: Monitor Anesthesia Care

## 2022-06-12 MED ORDER — APIXABAN 5 MG PO TABS
5.0000 mg | ORAL_TABLET | Freq: Two times a day (BID) | ORAL | Status: DC
  Administered 2022-06-12 – 2022-06-15 (×6): 5 mg via ORAL
  Filled 2022-06-12 (×6): qty 1

## 2022-06-12 MED ORDER — PHENYLEPHRINE 80 MCG/ML (10ML) SYRINGE FOR IV PUSH (FOR BLOOD PRESSURE SUPPORT)
PREFILLED_SYRINGE | INTRAVENOUS | Status: DC | PRN
  Administered 2022-06-12: 120 ug via INTRAVENOUS
  Administered 2022-06-12: 80 ug via INTRAVENOUS
  Administered 2022-06-12: 160 ug via INTRAVENOUS

## 2022-06-12 MED ORDER — FAMOTIDINE 20 MG PO TABS
20.0000 mg | ORAL_TABLET | Freq: Two times a day (BID) | ORAL | Status: DC
  Administered 2022-06-12 – 2022-06-15 (×7): 20 mg via ORAL
  Filled 2022-06-12 (×7): qty 1

## 2022-06-12 MED ORDER — LACTATED RINGERS IV SOLN
INTRAVENOUS | Status: AC | PRN
  Administered 2022-06-12: 1000 mL via INTRAVENOUS

## 2022-06-12 MED ORDER — PROCHLORPERAZINE EDISYLATE 10 MG/2ML IJ SOLN
10.0000 mg | Freq: Once | INTRAMUSCULAR | Status: AC | PRN
  Administered 2022-06-12: 10 mg via INTRAVENOUS
  Filled 2022-06-12: qty 2

## 2022-06-12 MED ORDER — PROPOFOL 500 MG/50ML IV EMUL
INTRAVENOUS | Status: DC | PRN
  Administered 2022-06-12: 50 ug/kg/min via INTRAVENOUS

## 2022-06-12 MED ORDER — SODIUM CHLORIDE 0.9 % IV SOLN
INTRAVENOUS | Status: DC
Start: 2022-06-12 — End: 2022-06-12

## 2022-06-12 MED ORDER — IOHEXOL 9 MG/ML PO SOLN
ORAL | Status: AC
  Administered 2022-06-12: 500 mL
  Filled 2022-06-12: qty 1000

## 2022-06-12 MED ORDER — POTASSIUM CHLORIDE CRYS ER 20 MEQ PO TBCR
40.0000 meq | EXTENDED_RELEASE_TABLET | Freq: Two times a day (BID) | ORAL | Status: AC
  Administered 2022-06-12 – 2022-06-14 (×6): 40 meq via ORAL
  Filled 2022-06-12 (×6): qty 2

## 2022-06-12 MED ORDER — ALBUTEROL SULFATE HFA 108 (90 BASE) MCG/ACT IN AERS
INHALATION_SPRAY | RESPIRATORY_TRACT | Status: DC | PRN
  Administered 2022-06-12: 4 via RESPIRATORY_TRACT

## 2022-06-12 SURGICAL SUPPLY — 15 items

## 2022-06-12 NOTE — Op Note (Addendum)
[facility] Patient Name: [name] Procedure Date : 06/12/2022 MRN: [id number] Attending MD: Thornton Park MD, MD Date of Birth: [birth year] CSN: [id number] Age: 81 Admit Type: Inpatient Procedure:                Upper GI endoscopy Indications:              Nausea with vomiting (see Dr. Ulyses Amor consult note                            for complete details) Providers:                Thornton Park MD, MD, Carlyn Reichert, RN, Cletis Athens, Technician Referring MD:              Medicines:                Monitored Anesthesia Care Complications:            Transient hypoxia with CRNA necessitating aborption                            the procedure prior to completing esophageal                            biopsies, hypoxia resolved with increased oxygen                            and chin lift, there was no witnessed aspiration Estimated Blood Loss:     Estimated blood loss was minimal. Estimated blood                            loss: none. Procedure:                Pre-Anesthesia Assessment:                           - Prior to the procedure, a History and Physical                            was performed, and patient medications and                            allergies were reviewed. The patient's tolerance of                            previous anesthesia was also reviewed. The risks                            and benefits of the procedure and the sedation                            options and risks were discussed with the patient.  All questions were answered, and informed consent                            was obtained. Prior Anticoagulants: The patient has                            taken no previous anticoagulant or antiplatelet                            agents. ASA Grade Assessment: IV - A patient with                            severe systemic disease that is a constant threat                             to life. After reviewing the risks and benefits,                            the patient was deemed in satisfactory condition to                            undergo the procedure.                           After obtaining informed consent, the endoscope was                            passed under direct vision. Throughout the                            procedure, the patient's blood pressure, pulse, and                            oxygen saturations were monitored continuously. The                            [id number]-[id number] ([id number]) Olympus endoscope was introduced                            through the mouth, and advanced to the third part                            of duodenum. The upper GI endoscopy was unusually                            difficult due to the patient's respiratory                            instability (hypoxia). Successful completion of the                            procedure was aided by performing chin lift and  administering oxygen. The patient tolerated the                            procedure well. Scope In: Scope Out: Findings:      Esophagitis with no bleeding was found. Suspected reflux esophagitis. No       ring web or stricture. The procedure was aborted before biopsies could       be performed.      Multiple erosions with no bleeding and no stigmata of recent bleeding       were found in the gastric antrum. Biopsies were taken from the antrum,       body, and fundus with a cold forceps for histology. Estimated blood loss       was minimal.      The examined duodenum was normal. Biopsies were taken with a cold       forceps for histology. Estimated blood loss was minimal.      A small hiatal hernia was present. Impression:               - Reflux esophagitis with no bleeding.                           - Erosive gastropathy with no bleeding and no                            stigmata of recent bleeding. Biopsied.                            - Normal examined duodenum. Biopsied.                           - Small hiatal hernia.                           - Findings are overall mild compared to the                            severity of her symptoms. Recommendation:           - Return patient to hospital ward for ongoing care.                           - Advance diet as tolerated.                           - Continue present medications including                            pantoprazole 40 mg BID.                           - Add famotidine 20 mg BID.                           - Avoid all NSAIDs.                           - Await pathology results.                           -  Keep the head of the bed elevated as much as                            possible, especially while eating, for several                            hours after meals, and at bedtime.                           - Proceed with gastric emptying scan as recommended                            by Dr. Benson Norway.                           The results were discussed with the patient's                            daughter, Mikle Bosworth, by phone. The patient had                            asked me to call her sister, Terrence Dupont, at                            [phone], but after multiple attempts I was                            unable to get an answer. Procedure Code(s):        --- Professional ---                           [phone], Esophagogastroduodenoscopy, flexible,                            transoral; with biopsy, single or multiple Diagnosis Code(s):        --- Professional ---                           K21.00, Gastro-esophageal reflux disease with                            esophagitis, without bleeding                           K31.89, Other diseases of stomach and duodenum                           R11.2, Nausea with vomiting, unspecified CPT copyright 2019 [employer]. All rights reserved. The codes documented in this report are preliminary and upon  coder review may  be revised to meet current compliance requirements. Thornton Park MD, MD 06/12/2022 10:19:14 AM This report has been signed electronically. Number of Addenda: 0

## 2022-06-12 NOTE — Anesthesia Preprocedure Evaluation (Addendum)
Anesthesia Evaluation  Patient identified by MRN, date of birth, ID band Patient awake    Reviewed: Allergy & Precautions, NPO status , Patient's Chart, lab work & pertinent test results  Airway Mallampati: III  TM Distance: >3 FB Neck ROM: Full    Dental  (+) Edentulous Upper, Upper Dentures, Missing, Dental Advisory Given,    Pulmonary shortness of breath, sleep apnea ,     + decreased breath sounds      Cardiovascular hypertension, (-) angina+ CAD, + Past MI, + Peripheral Vascular Disease and +CHF  + pacemaker  Rhythm:Regular  1. Left ventricular ejection fraction, by estimation, is 55 to 60%. The  left ventricle has normal function. The left ventricle has no regional  wall motion abnormalities. There is moderate concentric left ventricular  hypertrophy. Left ventricular  diastolic parameters are indeterminate.  2. Right ventricular systolic function is low normal. The right  ventricular size is normal.  3. Left atrial size was moderately dilated.  4. Right atrial size was mildly dilated.  5. The mitral valve is degenerative. Mild mitral valve regurgitation.  6. The aortic valve is tricuspid. Aortic valve regurgitation is not  visualized.  7. There is mild dilatation of the ascending aorta, measuring 42 mm.  There is mild (Grade II) atheroma plaque involving the aortic root and  ascending aorta.  8. The inferior vena cava is normal in size with greater than 50%  respiratory variability, suggesting right atrial pressure of 3 mmHg.   2021: ? Mid LAD lesion is 30% stenosed. ? Ost LAD to Prox LAD lesion is 30% stenosed.   Medical treatment.    Neuro/Psych  Headaches, PSYCHIATRIC DISORDERS Depression TIACVA    GI/Hepatic Neg liver ROS, GERD  ,  Endo/Other  Morbid obesityLab Results      Component                Value               Date                      HGBA1C                   5.8 (H)             06/04/2022              Renal/GU CRFRenal diseaseLab Results      Component                Value               Date                      CREATININE               1.48 (H)            06/12/2022           Lab Results      Component                Value               Date                      K                        3.4 (L)  06/12/2022                Musculoskeletal  (+) Arthritis ,   Abdominal   Peds  Hematology  (+) Blood dyscrasia, anemia , Lab Results      Component                Value               Date                      WBC                      5.0                 06/12/2022                HGB                      11.6 (L)            06/12/2022                HCT                      35.2 (L)            06/12/2022                MCV                      100.0               06/12/2022                PLT                      233                 06/12/2022              Anesthesia Other Findings   Reproductive/Obstetrics                            Anesthesia Physical Anesthesia Plan  ASA: 4  Anesthesia Plan: MAC   Post-op Pain Management: Minimal or no pain anticipated   Induction: Intravenous  PONV Risk Score and Plan: 2 and Propofol infusion and Treatment may vary due to age or medical condition  Airway Management Planned: Nasal Cannula and Natural Airway  Additional Equipment: None  Intra-op Plan:   Post-operative Plan:   Informed Consent: I have reviewed the patients History and Physical, chart, labs and discussed the procedure including the risks, benefits and alternatives for the proposed anesthesia with the patient or authorized representative who has indicated his/her understanding and acceptance.     Dental advisory given  Plan Discussed with: CRNA  Anesthesia Plan Comments:         Anesthesia Quick Evaluation

## 2022-06-12 NOTE — Progress Notes (Signed)
Subjective:   Summary: IVI GRIFFITH is a 81 y.o. year old female currently admitted on the IMTS HD#9 for vomiting and abdominal pain due to unknown source.  Overnight Events: NOE   Pt was seen in the afternoon. She was sitting in her chair, and states she is doing better. She states she had an episode of emesis this morning, and has consistently been feeling nauseous.   Objective:  Vital signs in last 24 hours: Vitals:   06/12/22 1040 06/12/22 1050 06/12/22 1100 06/12/22 1152  BP: 127/64  (!) 139/56 133/64  Pulse:    73  Resp: (!) 25  (!) 22 20  Temp:    98.6 F (37 C)  TempSrc:    Axillary  SpO2: 94% 94% 92% 92%  Weight:      Height:        Supplemental O2: Room Air 94%   Physical Exam:  Constitutional: obese woman, sitting in chair, in no acute distress  Cardiovascular: RRR, no murmurs, rubs or gallops Pulmonary/Chest: normal work of breathing on room air, lungs clear to auscultation bilaterally Abdominal: soft, non-tender, non-distended Skin: warm and dry Extremities: Chronic venous ulcers on BLE, currently wrapped with kerlix and ace wraps.  Filed Weights   06/09/22 0350 06/11/22 0442 06/12/22 0859  Weight: (!) 175.3 kg (!) 175.2 kg (!) 175.2 kg     Intake/Output Summary (Last 24 hours) at 06/12/2022 1210 Last data filed at 06/12/2022 1009 Gross per 24 hour  Intake 200 ml  Output 200 ml  Net 0 ml   Net IO Since Admission: -12,966.96 mL [06/12/22 1210]  Pertinent Labs:    Latest Ref Rng & Units 06/12/2022    2:36 AM 06/11/2022    4:10 AM 06/08/2022    4:29 AM  CBC  WBC 4.0 - 10.5 K/uL 5.0  5.2  4.3   Hemoglobin 12.0 - 15.0 g/dL 11.6  11.2  11.5   Hematocrit 36.0 - 46.0 % 35.2  34.3  35.2   Platelets 150 - 400 K/uL 233  214  154        Latest Ref Rng & Units 06/12/2022    2:36 AM 06/11/2022    4:10 AM 06/10/2022    3:47 AM  CMP  Glucose 70 - 99 mg/dL 91  92  92   BUN 8 - 23 mg/dL 13  18  21    Creatinine 0.44 - 1.00 mg/dL  1.48  1.55  1.49   Sodium 135 - 145 mmol/L 142  142  144   Potassium 3.5 - 5.1 mmol/L 3.4  3.0  3.4   Chloride 98 - 111 mmol/L 102  102  105   CO2 22 - 32 mmol/L 30  31  31    Calcium 8.9 - 10.3 mg/dL 8.5  8.4  8.4   Total Protein 6.5 - 8.1 g/dL  6.1  C   Total Bilirubin 0.3 - 1.2 mg/dL  0.6  C   Alkaline Phos 38 - 126 U/L  62  C   AST 15 - 41 U/L  21  C   ALT 0 - 44 U/L  13  C     C Corrected result    Assessment/Plan:   Active Problems:   Acute on chronic heart failure with preserved ejection fraction (HCC)   Sepsis (HCC)   Open wound of right lower leg   Diarrhea of presumed infectious  origin   Patient Summary: OLUWATOSIN BRACY is a 81 y.o. 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    #Vomiting Due to Unknown Causes #Hypokalemia  Pt has had these vomiting symptoms for the last month. KUB result showed nonobstructive, nonspecific bowel gas. GI performed and EGD procedure today which showed esophagitis and erosive gastropathy. throughout the gastric antrum. Both findings were not bleeding. GI recommended to continue PPI and added famotidine 20mg  BID. She had an episode of emesis today prior to procedure.  Pt's potassium level today has improved from 3.0 to 3.4  Plan:  - Continue potassium supplements -Appreciate GI recommendations  - Continue PPI (Pantoprazole 40mg  BID)  - Addition of Famotidine 20 mg BID -Avoidance of NSAIDs -Gastric Emptying Study is recommended and will be performed today or tomorrow   #Acute Hypoxic Respiratory Failure (Resolved)  #HfpEF, Volume Overloaded State  Pt denies shortness of breath today, and is able to saturate well on room air  Plan -Continue monitoring I/O, BMP, O2 requirements - Continue Torsemide 40 mg PO   #Post Biopsy Trauma Injury w/ Possible Venous Stasis  Pt is still having pain in her  legs . Wound care has been following with her to make sure both sites are clean   -Doxycycline has finished it's 5 day course  - Continue PT/OT   Code: Full   Dispo: Anticipated discharge in less than 2 midnights  Drucie Opitz, MD PGY-1 Internal Medicine Resident Pager Number [phone] Please contact the on call pager after 5 pm and on weekends at [phone].

## 2022-06-12 NOTE — Interval H&P Note (Signed)
History and Physical Interval Note:  06/12/2022 9:06 AM  [name]  has presented today for surgery, with the diagnosis of Chronic nausea/vomiting.  The various methods of treatment have been discussed with the patient and family. After consideration of risks, benefits and other options for treatment, the patient has consented to  Procedure(s): ESOPHAGOGASTRODUODENOSCOPY (EGD) WITH PROPOFOL (N/A) as a surgical intervention.  The patient's history has been reviewed, patient examined, no change in status, stable for surgery.  I have reviewed the patient's chart and labs.  Questions were answered to the patient's satisfaction.     Thornton Park

## 2022-06-12 NOTE — Transfer of Care (Signed)
Immediate Anesthesia Transfer of Care Note  Patient: [name]  Procedure(s) Performed: ESOPHAGOGASTRODUODENOSCOPY (EGD) WITH PROPOFOL BIOPSY  Patient Location: PACU  Anesthesia Type:MAC  Level of Consciousness: awake, alert , oriented, patient cooperative and responds to stimulation  Airway & Oxygen Therapy: Patient Spontanous Breathing and Patient connected to nasal cannula oxygen  Post-op Assessment: Report given to RN and Post -op Vital signs reviewed and stable  Post vital signs: Reviewed and stable  Last Vitals:  Vitals Value Taken Time  BP    Temp    Pulse    Resp    SpO2      Last Pain:  Vitals:   06/12/22 0859  TempSrc: Temporal  PainSc: 0-No pain      Patients Stated Pain Goal: 0 (03/54/65 6812)  Complications: No notable events documented.

## 2022-06-13 DIAGNOSIS — K209 Esophagitis, unspecified without bleeding: Secondary | ICD-10-CM | POA: Diagnosis not present

## 2022-06-13 DIAGNOSIS — R112 Nausea with vomiting, unspecified: Secondary | ICD-10-CM

## 2022-06-13 DIAGNOSIS — E876 Hypokalemia: Secondary | ICD-10-CM | POA: Diagnosis not present

## 2022-06-13 DIAGNOSIS — R1110 Vomiting, unspecified: Secondary | ICD-10-CM | POA: Diagnosis not present

## 2022-06-13 DIAGNOSIS — K319 Disease of stomach and duodenum, unspecified: Secondary | ICD-10-CM | POA: Diagnosis not present

## 2022-06-13 DIAGNOSIS — K2970 Gastritis, unspecified, without bleeding: Secondary | ICD-10-CM

## 2022-06-13 LAB — CBC
HCT: 35.1 % — ABNORMAL LOW (ref 36.0–46.0)
Hemoglobin: 11.6 g/dL — ABNORMAL LOW (ref 12.0–15.0)
MCH: 32.5 pg (ref 26.0–34.0)
MCHC: 33 g/dL (ref 30.0–36.0)
MCV: 98.3 fL (ref 80.0–100.0)
Platelets: 242 10*3/uL (ref 150–400)
RBC: 3.57 MIL/uL — ABNORMAL LOW (ref 3.87–5.11)
RDW: 17.2 % — ABNORMAL HIGH (ref 11.5–15.5)
WBC: 10.1 10*3/uL (ref 4.0–10.5)
nRBC: 0 % (ref 0.0–0.2)

## 2022-06-13 LAB — BASIC METABOLIC PANEL
Anion gap: 6 (ref 5–15)
BUN: 13 mg/dL (ref 8–23)
CO2: 27 mmol/L (ref 22–32)
Calcium: 8.4 mg/dL — ABNORMAL LOW (ref 8.9–10.3)
Chloride: 108 mmol/L (ref 98–111)
Creatinine, Ser: 1.68 mg/dL — ABNORMAL HIGH (ref 0.44–1.00)
GFR, Estimated: 31 mL/min — ABNORMAL LOW (ref >60)
Glucose, Bld: 92 mg/dL (ref 70–99)
Potassium: 4.1 mmol/L (ref 3.5–5.1)
Sodium: 141 mmol/L (ref 135–145)

## 2022-06-13 NOTE — Progress Notes (Addendum)
[facility] Progress Note  Providing Weekend Coverage for Dr. Benson Norway  CC:  Nausea  Assessment / Plan: Acute on chronic postprandial nausea and vomiting without alarm features    - no weight loss    - symptoms not fully explained by EGD, KUB or CT scan without contrast    - gastric and duodenal biopsies pending    - continue pantoprazole 40 mg BID and famotidine 20 mg BID    - gastric emptying scan ordered    - further recommendations per Dr. Benson Norway on his return tomorrow Mild esophagitis, small hiatal hernia and gastritis on EGD 06/12/22    - pathology results are pending Abnormal lung bases on CT    - ? etiology    - ? Are these findings contributing to GI symptoms Pancreatic atrophy by CT Chronic constipation reported by the patient    - not associated with nausea/vomiting Left-sided diverticulosis Screening colonoscopy with Dr. Collene Mares 2015      Subjective: Significant nausea, vomiting and gagging after her EGD yesterday. Feeling better today. No significant abdominal pain today. No family present at the time of my evaluation.   Objective:  Vital signs in last 24 hours: Temp:  [97.9 F (36.6 C)-99.4 F (37.4 C)] 97.9 F (36.6 C) (08/20 0824) Pulse Rate:  [68-87] 75 (08/20 1140) Resp:  [16-18] 18 (08/20 1140) BP: (103-143)/(48-67) 128/66 (08/20 1140) SpO2:  [90 %-98 %] 90 % (08/20 1140) Weight:  [176.5 kg] 176.5 kg (08/20 0500) Last BM Date : 06/10/22 General:   Alert, in NAD sitting in the bedside chair Heart:  Regular rate and rhythm; no murmurs Pulm: Clear anteriorly; no wheezing Abdomen:  Soft. Central obesity, Nontender. Nondistended. Normal bowel sounds. No rebound or guarding. LAD: No inguinal or umbilical LAD Extremities:  Without edema. Neurologic:  Alert and  oriented x4;  grossly normal neurologically. Psych:  Alert and cooperative. Normal mood and affect.   Lab Results: Recent Labs    06/11/22 0410 06/12/22 0236 06/13/22 0747  WBC  5.2 5.0 10.1  HGB 11.2* 11.6* 11.6*  HCT 34.3* 35.2* 35.1*  PLT 214 233 242   BMET Recent Labs    06/11/22 0410 06/12/22 0236 06/13/22 0747  NA 142 142 141  K 3.0* 3.4* 4.1  CL 102 102 108  CO2 31 30 27   GLUCOSE 92 91 92  BUN 18 13 13   CREATININE 1.55* 1.48* 1.68*  CALCIUM 8.4* 8.5* 8.4*   LFT Recent Labs    06/11/22 0410  PROT 6.1*  ALBUMIN 2.3*  AST 21  ALT 13  ALKPHOS 62  BILITOT 0.6  BILIDIR <0.1  IBILI NOT CALCULATED     CT ABDOMEN PELVIS WO CONTRAST  Result Date: 06/12/2022 CLINICAL DATA:  Intractable nausea vomiting. [name] has presented today for surgery, with the diagnosis of Chronic nausea/vomiting. The various methods of treatment have been discussed with the patient and family. After consideration of risks, benefits and other options for treatment, the patient has consented to Procedure(s):ESOPHAGOGASTRODUODENOSCOPY (EGD) WITH PROPOFOL (N/A) as a surgical intervention EXAM: CT ABDOMEN AND PELVIS WITHOUT CONTRAST TECHNIQUE: Multidetector CT imaging of the abdomen and pelvis was performed following the standard protocol without IV contrast. RADIATION DOSE REDUCTION: This exam was performed according to the departmental dose-optimization program which includes automated exposure control, adjustment of the mA and/or kV according to patient size and/or use of iterative reconstruction technique. COMPARISON:  06/03/2022. FINDINGS: Lower chest: Enlarged cardiac silhouette. Small patchy confluent and ground-glass peribronchovascular  opacities the lung bases, increased from the prior CT. Additional linear lung base opacities and more confluent posteromedial right lung base opacity consistent with atelectasis, and small right pleural effusion, also increased. Hepatobiliary: Normal liver. Small focus of density in the dependent gallbladder consistent with a small stone or stones. No wall thickening or inflammation. No bile duct dilation. Pancreas: Partial fatty  replacement. No pancreatic mass or inflammation. Spleen: Normal in size without focal abnormality. Adrenals/Urinary Tract: No adrenal masses. Right renal lower pole hypoattenuating 4.7 cm mass consistent with a cyst and unchanged. No follow-up recommended. No other renal masses, no stones and no hydronephrosis. Normal ureters. Diverticulum from the superior bladder, stable. Bladder otherwise unremarkable. Stomach/Bowel: Small hiatal hernia. Stomach decompressed, otherwise unremarkable. Small bowel and colon are decompressed. No bowel wall thickening. No inflammation. Multiple colonic diverticula, mostly on the left. No diverticulitis. Normal appendix. Vascular/Lymphatic: Aortic atherosclerosis. No aneurysm. No enlarged lymph nodes. Reproductive: Status post hysterectomy. No adnexal masses. Other: No abdominal wall hernia.  No ascites. Musculoskeletal: No fracture or acute finding. No osteoblastic or osteolytic lesions. IMPRESSION: 1. Small patchy confluent and ground-glass opacities at the lung bases, new compared to the prior CT. Consider multifocal infection if there are consistent clinical findings. Mild increase in the right posteromedial lung base atelectasis and small right effusion since the prior CT. 2. No acute findings within the abdomen or pelvis. 3. Small hiatal hernia. 4. Stomach, small bowel and colon are mostly decompressed. There are colonic diverticula without evidence of diverticulitis. Electronically Signed   By: Lajean Manes M.D.   On: 06/12/2022 17:39      LOS: 10 days   Thornton Park  06/13/2022, 12:26 PM

## 2022-06-13 NOTE — Progress Notes (Signed)
Subjective:   Summary: [name] is a 81 y.o. year old female currently admitted on the IMTS HD#10 for vomiting and abdominal pain due to unknown source.  Overnight Events: NOE   Pt was seen this AM bedside. She had another episode of vomiting last night after eating some potato soup and jello. She is also complaining of abdominal pain. Emesis is non-bloody.   Objective:  Vital signs in last 24 hours: Vitals:   06/13/22 0356 06/13/22 0500 06/13/22 0824 06/13/22 0825  BP: (!) 143/67  139/67   Pulse: 72  68   Resp: 18  18   Temp: 99.1 F (37.3 C)  97.9 F (36.6 C)   TempSrc: Oral  Oral   SpO2: 98%   94%  Weight:  (!) 176.5 kg    Height:       Supplemental O2: Room Air 94%    Physical Exam:  Constitutional: Obese woman, laying in bed, in no acute distress Cardiovascular: RRR, no murmurs, rubs or gallops Pulmonary/Chest: normal work of breathing on room air, lungs clear to auscultation bilaterally Abdominal: soft, non-tender, non-distended Skin: warm and dry Extremities: Chronic venous ulcers on BLUE, currently wrapped with kerlix and ace wraps  Filed Weights   06/11/22 0442 06/12/22 0859 06/13/22 0500  Weight: (!) 175.2 kg (!) 175.2 kg (!) 176.5 kg     Intake/Output Summary (Last 24 hours) at 06/13/2022 1101 Last data filed at 06/12/2022 1818 Gross per 24 hour  Intake --  Output 1150 ml  Net -1150 ml   Net IO Since Admission: -14,116.96 mL [06/13/22 1101]  Pertinent Labs:    Latest Ref Rng & Units 06/13/2022    7:47 AM 06/12/2022    2:36 AM 06/11/2022    4:10 AM  CBC  WBC 4.0 - 10.5 K/uL 10.1  5.0  5.2   Hemoglobin 12.0 - 15.0 g/dL 11.6  11.6  11.2   Hematocrit 36.0 - 46.0 % 35.1  35.2  34.3   Platelets 150 - 400 K/uL 242  233  214        Latest Ref Rng & Units 06/13/2022    7:47 AM 06/12/2022    2:36 AM 06/11/2022    4:10 AM  CMP  Glucose 70 - 99 mg/dL 92  91  92   BUN 8 - 23 mg/dL 13  13  18    Creatinine 0.44 - 1.00 mg/dL  1.68  1.48  1.55   Sodium 135 - 145 mmol/L 141  142  142   Potassium 3.5 - 5.1 mmol/L 4.1  3.4  3.0   Chloride 98 - 111 mmol/L 108  102  102   CO2 22 - 32 mmol/L 27  30  31    Calcium 8.9 - 10.3 mg/dL 8.4  8.5  8.4   Total Protein 6.5 - 8.1 g/dL   6.1  C  Total Bilirubin 0.3 - 1.2 mg/dL   0.6  C  Alkaline Phos 38 - 126 U/L   62  C  AST 15 - 41 U/L   21  C  ALT 0 - 44 U/L   13  C    C Corrected result     Imaging: CT ABDOMEN PELVIS WO CONTRAST  Result Date: 06/12/2022 CLINICAL DATA:  Intractable nausea vomiting. [name] has presented today for surgery, with the diagnosis of Chronic nausea/vomiting. The various methods of treatment have  been discussed with the patient and family. After consideration of risks, benefits and other options for treatment, the patient has consented to Procedure(s):ESOPHAGOGASTRODUODENOSCOPY (EGD) WITH PROPOFOL (N/A) as a surgical intervention EXAM: CT ABDOMEN AND PELVIS WITHOUT CONTRAST TECHNIQUE: Multidetector CT imaging of the abdomen and pelvis was performed following the standard protocol without IV contrast. RADIATION DOSE REDUCTION: This exam was performed according to the departmental dose-optimization program which includes automated exposure control, adjustment of the mA and/or kV according to patient size and/or use of iterative reconstruction technique. COMPARISON:  06/03/2022. FINDINGS: Lower chest: Enlarged cardiac silhouette. Small patchy confluent and ground-glass peribronchovascular opacities the lung bases, increased from the prior CT. Additional linear lung base opacities and more confluent posteromedial right lung base opacity consistent with atelectasis, and small right pleural effusion, also increased. Hepatobiliary: Normal liver. Small focus of density in the dependent gallbladder consistent with a small stone or stones. No wall thickening or inflammation. No bile duct dilation. Pancreas: Partial fatty replacement. No pancreatic mass or  inflammation. Spleen: Normal in size without focal abnormality. Adrenals/Urinary Tract: No adrenal masses. Right renal lower pole hypoattenuating 4.7 cm mass consistent with a cyst and unchanged. No follow-up recommended. No other renal masses, no stones and no hydronephrosis. Normal ureters. Diverticulum from the superior bladder, stable. Bladder otherwise unremarkable. Stomach/Bowel: Small hiatal hernia. Stomach decompressed, otherwise unremarkable. Small bowel and colon are decompressed. No bowel wall thickening. No inflammation. Multiple colonic diverticula, mostly on the left. No diverticulitis. Normal appendix. Vascular/Lymphatic: Aortic atherosclerosis. No aneurysm. No enlarged lymph nodes. Reproductive: Status post hysterectomy. No adnexal masses. Other: No abdominal wall hernia.  No ascites. Musculoskeletal: No fracture or acute finding. No osteoblastic or osteolytic lesions. IMPRESSION: 1. Small patchy confluent and ground-glass opacities at the lung bases, new compared to the prior CT. Consider multifocal infection if there are consistent clinical findings. Mild increase in the right posteromedial lung base atelectasis and small right effusion since the prior CT. 2. No acute findings within the abdomen or pelvis. 3. Small hiatal hernia. 4. Stomach, small bowel and colon are mostly decompressed. There are colonic diverticula without evidence of diverticulitis. Electronically Signed   By: Lajean Manes M.D.   On: 06/12/2022 17:39     Assessment/Plan:   Active Problems:   Acute on chronic heart failure with preserved ejection fraction (HCC)   Sepsis ([place])   Ulcers of both lower legs, limited to breakdown of skin ([place])   Diarrhea of presumed infectious origin   Open wound of right lower leg   Patient Summary: [name] is a 81 y.o. 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    #Vomiting due to Unknown Causes  #Hypokalemia (Resolved) Pt has had vomiting symptoms for the last month. KUB results showed nonobstructive nonspecific bowel gas. EGD was preformed which showed esophagitis and erosive gastropathy throughout the gastric antrum.   Pt is still having episodes of vomiting, and was supposed ot undergo a gastric emptying study today. However, IR does not perform these on the weekends, and will have to be tomorrow.   Potassium level is 4.1 this AM.   Plan:  Appreciate GI recommendations  - Continue Pantoprazole 40mg  BID - Cont famotidine 20mg  BID  - Gastric emptying study will be done tomorrow  - Started soft diet today, will be NPO at midnight tonight   #Acute Hypoxic Respiratory Failure (Resolved)  #HFpEf,  Volume overloaded State Pt denies SOB today, and is saturating on room air  Plan:  - Continue monitoring I/O, BMP, O2 requirements  - Continue Torsemide 40mg  PO    #Post Biopsy Trauma Injury w/ Possible Venous Stasis Pt is still having pain in her legs, requested wound care to come today to change dressings.  - Continue PT/OT  Code: Full   Dispo: Anticipated discharge in more than 2 midnights.   Drucie Opitz, MD PGY-1 Internal Medicine Resident Pager Number [phone] Please contact the on call pager after 5 pm and on weekends at [phone].

## 2022-06-13 NOTE — Progress Notes (Signed)
Wound care completed on bilateral lower extremities.

## 2022-06-14 ENCOUNTER — Encounter (HOSPITAL_COMMUNITY): Payer: Self-pay | Admitting: Gastroenterology

## 2022-06-14 DIAGNOSIS — R112 Nausea with vomiting, unspecified: Secondary | ICD-10-CM

## 2022-06-14 DIAGNOSIS — E8770 Fluid overload, unspecified: Secondary | ICD-10-CM | POA: Diagnosis not present

## 2022-06-14 DIAGNOSIS — I5033 Acute on chronic diastolic (congestive) heart failure: Secondary | ICD-10-CM | POA: Diagnosis not present

## 2022-06-14 LAB — BASIC METABOLIC PANEL
Anion gap: 5 (ref 5–15)
BUN: 15 mg/dL (ref 8–23)
CO2: 30 mmol/L (ref 22–32)
Calcium: 8.7 mg/dL — ABNORMAL LOW (ref 8.9–10.3)
Chloride: 109 mmol/L (ref 98–111)
Creatinine, Ser: 1.95 mg/dL — ABNORMAL HIGH (ref 0.44–1.00)
GFR, Estimated: 26 mL/min — ABNORMAL LOW (ref >60)
Glucose, Bld: 89 mg/dL (ref 70–99)
Potassium: 4.1 mmol/L (ref 3.5–5.1)
Sodium: 144 mmol/L (ref 135–145)

## 2022-06-14 LAB — CBC
HCT: 37.4 % (ref 36.0–46.0)
Hemoglobin: 11.9 g/dL — ABNORMAL LOW (ref 12.0–15.0)
MCH: 32.1 pg (ref 26.0–34.0)
MCHC: 31.8 g/dL (ref 30.0–36.0)
MCV: 100.8 fL — ABNORMAL HIGH (ref 80.0–100.0)
Platelets: 265 10*3/uL (ref 150–400)
RBC: 3.71 MIL/uL — ABNORMAL LOW (ref 3.87–5.11)
RDW: 17.6 % — ABNORMAL HIGH (ref 11.5–15.5)
WBC: 6.4 10*3/uL (ref 4.0–10.5)
nRBC: 0 % (ref 0.0–0.2)

## 2022-06-14 MED ORDER — METOCLOPRAMIDE HCL 5 MG/ML IJ SOLN: 10.0000 mg | Freq: Three times a day (TID) | INTRAMUSCULAR | Status: DC

## 2022-06-14 MED ORDER — METOCLOPRAMIDE HCL 10 MG PO TABS
5.0000 mg | ORAL_TABLET | Freq: Three times a day (TID) | ORAL | Status: DC
  Administered 2022-06-14 – 2022-06-15 (×3): 5 mg via ORAL
  Filled 2022-06-14 (×3): qty 1

## 2022-06-14 MED ORDER — METOCLOPRAMIDE HCL 5 MG/ML IJ SOLN: 5.0000 mg | Freq: Three times a day (TID) | INTRAMUSCULAR | Status: DC

## 2022-06-14 MED ORDER — IPRATROPIUM-ALBUTEROL 0.5-2.5 (3) MG/3ML IN SOLN
3.0000 mL | Freq: Four times a day (QID) | RESPIRATORY_TRACT | Status: DC | PRN
Start: 2022-06-14 — End: 2022-06-15

## 2022-06-14 NOTE — Progress Notes (Signed)
Per [facility] (David/Michella), pt weighs too much for the procedure, the max weight is 350, pts standing weight today 370.1

## 2022-06-14 NOTE — Progress Notes (Signed)
Physical Therapy Treatment Patient Details Name: [name] MRN: [id number] DOB: February 16, 1941 Today's Date: 06/14/2022   History of Present Illness Pt is a 81 year old woman admitted from home on 06/03/22 with abdominal pain, n/v/d, sepsis. GI workup pending given continued N/V. PMH: dCHF, CKD IIb, PAF on Eliquis, SSS s/p pacemaker, HTN, CAD, HLD, CVA, chronic LE wounds, depression, obesity.    PT Comments    Pt reports feeling much better today nausea and vomiting-wise. Pt tolerated x2 bouts of short distance ambulation in room, and overall is requiring less physical assist. Pt has been great at advocating for OOB daily while acute, and is progressing well overall. PT to continue to follow.     Recommendations for follow up therapy are one component of a multi-disciplinary discharge planning process, led by the attending physician.  Recommendations may be updated based on patient status, additional functional criteria and insurance authorization.  Follow Up Recommendations  Home health PT Can patient physically be transported by private vehicle: Yes   Assistance Recommended at Discharge Intermittent Supervision/Assistance  Patient can return home with the following A little help with walking and/or transfers;A lot of help with bathing/dressing/bathroom   Equipment Recommendations  None recommended by PT    Recommendations for Other Services       Precautions / Restrictions Precautions Precautions: Fall Restrictions Weight Bearing Restrictions: No     Mobility  Bed Mobility               General bed mobility comments: up in chair    Transfers Overall transfer level: Needs assistance Equipment used: Rolling walker (2 wheels) Transfers: Sit to/from Stand Sit to Stand: Min assist           General transfer comment: assist for power up, rise, steadying. STS x2, from EOB and toilet    Ambulation/Gait Ambulation/Gait assistance: Min guard Gait Distance  (Feet): 10 Feet (x2 - to and from [facility]) Assistive device: Rolling walker (2 wheels) Gait Pattern/deviations: Step-through pattern, Decreased stride length, Wide base of support, Trunk flexed Gait velocity: decr     General Gait Details: cues for upright posture, proximity to [employer]             Wheelchair Mobility    Modified Rankin (Stroke Patients Only)       Balance Overall balance assessment: Needs assistance   Sitting balance-Leahy Scale: Fair     Standing balance support: Bilateral upper extremity supported Standing balance-Leahy Scale: Poor Standing balance comment: reliant on external assist                            Cognition Arousal/Alertness: Awake/alert Behavior During Therapy: WFL for tasks assessed/performed, Anxious Overall Cognitive Status: Within Functional Limits for tasks assessed                                          Exercises      General Comments        Pertinent Vitals/Pain Pain Assessment Pain Assessment: Faces Faces Pain Scale: No hurt Pain Intervention(s): Monitored during session    Home Living                          Prior Function            PT Goals (current  goals can now be found in the care plan section) Acute Rehab PT Goals Patient Stated Goal: home PT Goal Formulation: With patient Time For Goal Achievement: 06/18/22 Potential to Achieve Goals: Good Progress towards PT goals: Progressing toward goals    Frequency    Min 3X/week      PT Plan Current plan remains appropriate    Co-evaluation              AM-PAC PT "6 Clicks" Mobility   Outcome Measure  Help needed turning from your back to your side while in a flat bed without using bedrails?: A Little Help needed moving from lying on your back to sitting on the side of a flat bed without using bedrails?: A Little Help needed moving to and from a bed to a chair (including a wheelchair)?: A  Lot Help needed standing up from a chair using your arms (e.g., wheelchair or bedside chair)?: A Little Help needed to walk in hospital room?: A Little Help needed climbing 3-5 steps with a railing? : A Lot 6 Click Score: 16    End of Session   Activity Tolerance: Patient tolerated treatment well Patient left: with call bell/phone within reach;in chair Nurse Communication: Mobility status       Time: 3532-9924 PT Time Calculation (min) (ACUTE ONLY): 14 min  Charges:  $Therapeutic Activity: 8-22 mins                     [name], PT DPT Acute Rehabilitation Services Pager [phone]  Office [phone]    [name] 06/14/2022, 3:32 PM

## 2022-06-14 NOTE — Progress Notes (Signed)
Subjective:   Summary: [name] is a 81 y.o. year old female currently admitted on the IMTS HD#11 for vomiting and abdominal pain due to unknown source.  Overnight Events: NOE   Pt seen this AM bedside. She had another episode of post-prandial vomiting last night, as well as complaining of abdominal pain.  Objective:  Vital signs in last 24 hours: Vitals:   06/14/22 0000 06/14/22 0339 06/14/22 0739 06/14/22 1004  BP: 132/81 (!) 143/52    Pulse: 71 63    Resp: 18 16    Temp: 98.2 F (36.8 C) 98.3 F (36.8 C)    TempSrc: Oral Oral    SpO2: 92% 92% 90%   Weight:    (!) 167.9 kg  Height:       Supplemental O2: Room Air    Physical Exam:  Constitutional: Obese woman, laying in bed, in no acute distress Cardiovascular: RRR, no murmurs, rubs or gallops Pulmonary/Chest: normal work of breathing on room air, lungs clear to auscultation bilaterally Abdominal: soft, tender in LLQ, non-distended Skin: warm and dry Extremities: upper/lower extremity pulses 2+, no lower extremity edema present  Filed Weights   06/12/22 0859 06/13/22 0500 06/14/22 1004  Weight: (!) 175.2 kg (!) 176.5 kg (!) 167.9 kg    No intake or output data in the 24 hours ending 06/14/22 1428 Net IO Since Admission: -14,116.96 mL [06/14/22 1428]  Pertinent Labs:    Latest Ref Rng & Units 06/14/2022    3:03 AM 06/13/2022    7:47 AM 06/12/2022    2:36 AM  CBC  WBC 4.0 - 10.5 K/uL 6.4  10.1  5.0   Hemoglobin 12.0 - 15.0 g/dL 11.9  11.6  11.6   Hematocrit 36.0 - 46.0 % 37.4  35.1  35.2   Platelets 150 - 400 K/uL 265  242  233        Latest Ref Rng & Units 06/14/2022    3:03 AM 06/13/2022    7:47 AM 06/12/2022    2:36 AM  CMP  Glucose 70 - 99 mg/dL 89  92  91   BUN 8 - 23 mg/dL 15  13  13    Creatinine 0.44 - 1.00 mg/dL 1.95  1.68  1.48   Sodium 135 - 145 mmol/L 144  141  142   Potassium 3.5 - 5.1 mmol/L 4.1  4.1  3.4   Chloride 98 - 111 mmol/L 109  108  102   CO2 22 -  32 mmol/L 30  27  30    Calcium 8.9 - 10.3 mg/dL 8.7  8.4  8.5     Assessment/Plan:   Active Problems:   Acute on chronic heart failure with preserved ejection fraction (HCC)   Sepsis (HCC)   Ulcers of both lower legs, limited to breakdown of skin (HCC)   Diarrhea of presumed infectious origin   Open wound of right lower leg   Esophagitis with gastritis   Nausea and vomiting   Patient Summary: [name] is a 81 y.o. 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    #Acute on Chronic Post-Prandial Emesis with no alarming features Pt has had these vomiting symptoms for the last month. KUB showed nonobstructive nonspecific bowel gas. EGD performed showed esophagitis and erosive gastropathy throughout  the gastric antrum.   Pt states she had an episode last night. Gastric emptying study was attempted, however pt exceeds the weight limit. Spoke to GI about potential recommendations, and per GI the best way to proceed is symptom management with Reglan.  Plan:  - Continue Pantoprazole 40mg  BID  - Continue Famotidine 20 mg BID  - Added Reglan 10mg  before meals   #Acute Hypoxic Respiratory Failure (Resolved)  #HFpEF, Volume Overloaded State   Pt denies SOB today, and is saturating on room air  Plan:  - Continue monitoring I/O, BMP, O2 requirements  - Continue Torsemide 40mg  PO  Code: Full   Dispo: Anticipated discharge in less than two midnights  Drucie Opitz, MD PGY-1 Internal Medicine Resident Pager Number [phone] Please contact the on call pager after 5 pm and on weekends at [phone].

## 2022-06-15 DIAGNOSIS — I83029 Varicose veins of left lower extremity with ulcer of unspecified site: Secondary | ICD-10-CM

## 2022-06-15 DIAGNOSIS — E8771 Transfusion associated circulatory overload: Secondary | ICD-10-CM

## 2022-06-15 DIAGNOSIS — N179 Acute kidney failure, unspecified: Secondary | ICD-10-CM

## 2022-06-15 DIAGNOSIS — I83019 Varicose veins of right lower extremity with ulcer of unspecified site: Secondary | ICD-10-CM

## 2022-06-15 LAB — CBC
HCT: 36.5 % (ref 36.0–46.0)
Hemoglobin: 11.8 g/dL — ABNORMAL LOW (ref 12.0–15.0)
MCH: 32.7 pg (ref 26.0–34.0)
MCHC: 32.3 g/dL (ref 30.0–36.0)
MCV: 101.1 fL — ABNORMAL HIGH (ref 80.0–100.0)
Platelets: 252 10*3/uL (ref 150–400)
RBC: 3.61 MIL/uL — ABNORMAL LOW (ref 3.87–5.11)
RDW: 17.3 % — ABNORMAL HIGH (ref 11.5–15.5)
WBC: 5 10*3/uL (ref 4.0–10.5)
nRBC: 0 % (ref 0.0–0.2)

## 2022-06-15 LAB — BASIC METABOLIC PANEL
Anion gap: 10 (ref 5–15)
BUN: 16 mg/dL (ref 8–23)
CO2: 25 mmol/L (ref 22–32)
Calcium: 8.7 mg/dL — ABNORMAL LOW (ref 8.9–10.3)
Chloride: 109 mmol/L (ref 98–111)
Creatinine, Ser: 2.05 mg/dL — ABNORMAL HIGH (ref 0.44–1.00)
GFR, Estimated: 24 mL/min — ABNORMAL LOW (ref >60)
Glucose, Bld: 105 mg/dL — ABNORMAL HIGH (ref 70–99)
Potassium: 4.5 mmol/L (ref 3.5–5.1)
Sodium: 144 mmol/L (ref 135–145)

## 2022-06-15 LAB — SURGICAL PATHOLOGY

## 2022-06-15 MED ORDER — METOCLOPRAMIDE HCL 5 MG PO TABS: 2.5000 mg | ORAL_TABLET | Freq: Three times a day (TID) | ORAL | 3 refills | Status: DC

## 2022-06-15 MED ORDER — ZINC OXIDE 40 % EX OINT: TOPICAL_OINTMENT | Freq: Three times a day (TID) | CUTANEOUS | 0 refills | Status: DC | PRN

## 2022-06-15 MED ORDER — PANTOPRAZOLE SODIUM 40 MG PO TBEC: 40.0000 mg | DELAYED_RELEASE_TABLET | Freq: Two times a day (BID) | ORAL | 3 refills | Status: DC

## 2022-06-15 MED ORDER — ZINC OXIDE 40 % EX OINT
TOPICAL_OINTMENT | Freq: Three times a day (TID) | CUTANEOUS | Status: DC | PRN
  Filled 2022-06-15: qty 57

## 2022-06-15 MED ORDER — FAMOTIDINE 20 MG PO TABS: 20.0000 mg | ORAL_TABLET | Freq: Two times a day (BID) | ORAL | 3 refills | Status: DC

## 2022-06-15 NOTE — Discharge Summary (Cosign Needed)
Name: [name] MRN: [id number] DOB: 1941/01/06 81 y.o. PCP: Rocky Morel, MD  Date of Admission: 06/03/2022  1:31 AM Date of Discharge: No discharge date for patient encounter. Attending Physician: Angelica Pou, MD  Discharge Diagnosis: 1. Active Problems:   Acute on chronic heart failure with preserved ejection fraction (HCC)   Sepsis (HCC)   Ulcers of both lower legs, limited to breakdown of skin (HCC)   Diarrhea of presumed infectious origin   Open wound of right lower leg   Esophagitis with gastritis   Nausea and vomiting    Discharge Medications: Allergies as of 06/15/2022       Reactions   Omnipen [ampicillin] Other (See Comments)   Increases heart rhythm   Penicillins Other (See Comments)   Increases heart rhythm   Linzess [linaclotide] Other (See Comments)   Severe stomach pains        Medication List     STOP taking these medications    oxyCODONE-acetaminophen 5-325 MG tablet Commonly known as: PERCOCET/ROXICET       TAKE these medications    allopurinol 300 MG tablet Commonly known as: ZYLOPRIM Take 300 mg by mouth daily.   amiodarone 200 MG tablet Commonly known as: PACERONE Take 200 mg by mouth daily.   apixaban 5 MG Tabs tablet Commonly known as: ELIQUIS Take 5 mg by mouth 2 (two) times daily.   doxercalciferol 0.5 MCG capsule Commonly known as: HECTOROL Take 0.5 mcg by mouth daily.   famotidine 40 MG tablet Commonly known as: PEPCID Take 40 mg by mouth daily. What changed: Another medication with the same name was added. Make sure you understand how and when to take each.   famotidine 20 MG tablet Commonly known as: PEPCID Take 1 tablet (20 mg total) by mouth 2 (two) times daily. What changed: You were already taking a medication with the same name, and this prescription was added. Make sure you understand how and when to take each.   Farxiga 10 MG Tabs tablet Generic drug: dapagliflozin  propanediol Take 10 mg by mouth daily.   isosorbide mononitrate 30 MG 24 hr tablet Commonly known as: IMDUR Take 1 tablet (30 mg total) by mouth daily.   liver oil-zinc oxide 40 % ointment Commonly known as: DESITIN Apply topically 3 (three) times daily as needed for irritation.   losartan 100 MG tablet Commonly known as: COZAAR Take 100 mg by mouth daily.   meclizine 25 MG tablet Commonly known as: ANTIVERT Take 25 mg by mouth 3 (three) times daily as needed for dizziness.   metoCLOPramide 5 MG tablet Commonly known as: REGLAN Take 0.5 tablets (2.5 mg total) by mouth 3 (three) times daily before meals.   metoprolol succinate 100 MG 24 hr tablet Commonly known as: TOPROL-XL Take 100 mg by mouth daily.   mupirocin ointment 2 % Commonly known as: BACTROBAN Apply 1 Application topically 2 (two) times daily.   pantoprazole 40 MG tablet Commonly known as: PROTONIX Take 1 tablet (40 mg total) by mouth 2 (two) times daily.   polyethylene glycol 17 g packet Commonly known as: MIRALAX / GLYCOLAX Take 17 g by mouth daily as needed for mild constipation.   potassium chloride SA 20 MEQ tablet Commonly known as: KLOR-CON M Take 20 mEq by mouth 2 (two) times daily.   rosuvastatin 10 MG tablet Commonly known as: CRESTOR Take 10 mg by mouth at bedtime.   silver sulfADIAZINE 1 % cream Commonly known as: SILVADENE Apply  1 Application topically 2 (two) times daily.   torsemide 20 MG tablet Commonly known as: Demadex Take 2 tablets (40 mg total) by mouth daily.               Durable Medical Equipment  (From admission, onward)           Start     Ordered   06/15/22 1137  For home use only DME 4 wheeled rolling walker with seat  Once       Comments: Bariatric needed due to body habitus and weight of 370 pounds  Question:  Patient needs a walker to treat with the following condition  Answer:  Weakness   06/15/22 1137              Discharge Care Instructions   (From admission, onward)           Start     Ordered   06/15/22 0000  Leave dressing on - Keep it clean, dry, and intact until clinic visit        06/15/22 1400   06/15/22 0000  Discharge wound care:       Comments: Please change at your convenience.   06/15/22 1400            Disposition and follow-up:   [name] was discharged from [facility] in Good condition.  At the hospital follow up visit please address:  1.  Nausea, vomiting, gastroenteritis sx, shortness of breath.   2.  Labs / imaging needed at time of follow-up: EKG  3.  Pending labs/ test needing follow-up: N/A  Follow-up Appointments:  Follow-up Information     [place] [place] Follow up.   Specialty: [place] [facility]: also known as [facility]. They will call you in 1-2 days to set up your first home appointment Contact information: [place] [address] [phone]                 Hospital Course by problem list:  #Iatrogenic Volume Overload due to IVF Resuscitation of hypotensive hypovolemia resulting from viral GE in setting of underlying CHF  Ms. Depinto presented to the Emergency Department on 8/10 for a one day history of LUQ pain, nausea, vomiting, and diarrhea. She had underwent a punch biopsy on the R leg to evaluate for venous stasis vs pyoderma gangrenosum, and started to feel worse after getting home from the office visit. She was hypotensive, with MAPs in the 60s which improved with fluids. Lactic acid at the time was 3. She was started on vanc and rocephin, and CT of abdomen was unremarkable.   Fluid resuscitation was underway with normal saline, however pt started developing shortness of breath, most likely due to over-correction of hypovolemia. She was requiring 7L of oxygen to sat at 95%. She was then started on Lasix 80 mg BID IV, and her BMP was consistently monitored. Echo revealed  EF of 55%. I's and O's were monitored consistently as well, for goal of -2L everyday. SOB and oxygen requirements resolved, and she was then switched to her home dose of torsemide 40mg  PO.    #Acute on Chronic Post-Prandial Vomiting Emesis with No Alarming Features   Pt reportedly had a month history of Post-Prandial vomiting. Initially, the team thought this was due to viral gastroenteritis, however due to the longevity of the emesis, we looked for alternative causes. GI was consulted and recommended an EGD. EGD revealed  esophagitis and erosive gastropathy throughout the gastric antrum. GI recommended the continuance of Pantoprazole 40mg  BID, and added Famotidine 20mg  BID. EGD did not provide reasonable explanation for Post-Prandial vomiting, and gastric emptying study was attempted next. However, pt did not meet weight requirements. GI was reached out to again, however they recommended symptomatic treatment with Reglan 5mg  before meals.    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 She sees wound care clinic at [facility]. 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 She continuously had pain in her legs, and wound care has been re-wrapping her legs consistently throughout her course in the hospital. Wounds have been bleeding, and pt was started on a 5 day course of doxycycline 100 mg, which she completed. Pt will follow up with wound care clinic in [facility] and make an appointment with them.      Discharge Exam:   BP 130/77 (BP Location: Left Wrist)   Pulse 66   Temp 98 F (36.7 C) (Axillary)   Resp 16   Ht 5\' 7"  (1.702 m)   Wt (!) 167.9 kg   SpO2 95%   BMI 57.97 kg/m  Discharge exam:   Constitutional: Obese woman, laying in bed in no acute distress Cardiovascular: RRR, no murmurs, rubs or gallops Pulmonary/Chest: Normal work of breathing on room air, lungs clear to auscultation  bilaterally  Abdominal: Soft, non-tender, non-distended  Skin: Warm and dry, friction rash underneath L Breath  Extremities: Chronic venous ulcers on BLE     Pertinent Labs, Studies, and Procedures:     Latest Ref Rng & Units 06/15/2022    6:03 AM 06/14/2022    3:03 AM 06/13/2022    7:47 AM  CBC  WBC 4.0 - 10.5 K/uL 5.0  6.4  10.1   Hemoglobin 12.0 - 15.0 g/dL 11.8  11.9  11.6   Hematocrit 36.0 - 46.0 % 36.5  37.4  35.1   Platelets 150 - 400 K/uL 252  265  242         Latest Ref Rng & Units 06/15/2022    6:03 AM 06/14/2022    3:03 AM 06/13/2022    7:47 AM  BMP  Glucose 70 - 99 mg/dL 105  89  92   BUN 8 - 23 mg/dL 16  15  13    Creatinine 0.44 - 1.00 mg/dL 2.05  1.95  1.68   Sodium 135 - 145 mmol/L 144  144  141   Potassium 3.5 - 5.1 mmol/L 4.5  4.1  4.1   Chloride 98 - 111 mmol/L 109  109  108   CO2 22 - 32 mmol/L 25  30  27    Calcium 8.9 - 10.3 mg/dL 8.7  8.7  8.4     DG CHEST PORT 1 VIEW  Result Date: 06/03/2022 CLINICAL DATA:  Dyspnea. EXAM: PORTABLE CHEST 1 VIEW COMPARISON:  One-view chest x-ray 06/03/2022 FINDINGS: The heart is enlarged. Diffuse interstitial pattern has increased. External leads obscure the right hemidiaphragm. Pacing wires are stable. IMPRESSION: Cardiomegaly with increasing interstitial edema compatible with congestive heart failure. Electronically Signed   By: San Morelle M.D.   On: 06/03/2022 20:28   CT ABDOMEN PELVIS WO CONTRAST  Result Date: 06/03/2022 CLINICAL DATA:  Acute, nonlocalized abdominal pain. Nausea, vomiting, and diarrhea EXAM: CT ABDOMEN AND PELVIS WITHOUT CONTRAST TECHNIQUE: Multidetector CT imaging of the abdomen and pelvis was performed following  the standard protocol without IV contrast. RADIATION DOSE REDUCTION: This exam was performed according to the departmental dose-optimization program which includes automated exposure control, adjustment of the mA and/or kV according to patient size and/or use of iterative reconstruction  technique. COMPARISON:  07/16/2020 FINDINGS: Lower chest: Streaky density at the lung bases with volume loss-mild atelectasis. Cardiomegaly. Dual-chamber pacer with somewhat redundant right ventricular lead looping inferiorly. Hepatobiliary: No focal liver abnormality.High-density in the dependent gallbladder could be streak artifact. No definite cholelithiasis; no cholecystitis. Pancreas: Generalized atrophy without acute finding Spleen: Unremarkable. Adrenals/Urinary Tract: Negative adrenals. No hydronephrosis or stone. Partially covered right renal cystic intensity. Unremarkable bladder. Stomach/Bowel: Small sliding hiatal hernia. Extensive colonic diverticulosis. No bowel obstruction or visible inflammation, including of the appendix Vascular/Lymphatic: No acute vascular abnormality. Mild for age atheromatous calcification. No mass or adenopathy. Reproductive:Hysterectomy Other: No ascites or pneumoperitoneum. Musculoskeletal: No acute abnormalities. Advanced lumbar spine degeneration. IMPRESSION: No acute finding. Negative for bowel obstruction or visible inflammation. Electronically Signed   By: Jorje Guild M.D.   On: 06/03/2022 06:10   DG Chest Port 1 View  Result Date: 06/03/2022 CLINICAL DATA:  Questionable sepsis - evaluate for abnormality Nausea, vomiting, diarrhea. EXAM: PORTABLE CHEST 1 VIEW COMPARISON:  Radiograph 08/17/2021 FINDINGS: Right-sided pacemaker in place. Cardiomegaly is stable. Unchanged mediastinal contours. There is vascular congestion. Interstitial thickening suggest pulmonary edema. Trace fluid in the right minor fissure. There may be small pleural effusions, not well assessed on this portable AP view. No pneumothorax. IMPRESSION: Cardiomegaly with vascular congestion and interstitial thickening suspicious for pulmonary edema. Suspect small pleural effusions. Electronically Signed   By: Keith Rake M.D.   On: 06/03/2022 02:00      Discharge Instructions: Discharge  Instructions     Call MD for:  persistant nausea and vomiting   Complete by: As directed    Call MD for:  severe uncontrolled pain   Complete by: As directed    Call MD for:  temperature >100.4   Complete by: As directed    Diet - low sodium heart healthy   Complete by: As directed    Discharge wound care:   Complete by: As directed    Please change at your convenience.   Increase activity slowly   Complete by: As directed    Leave dressing on - Keep it clean, dry, and intact until clinic visit   Complete by: As directed        Signed: Drucie Opitz, MD 06/15/2022, 2:01 PM   Pager: [phone]

## 2022-06-15 NOTE — Progress Notes (Signed)
Discharge instructions provided to patient. All medications, follow up appointments, and discharge instructions discussed. IV out. Monitor off CCMD notified. Discharging to home with family. Era Bumpers, RN

## 2022-06-15 NOTE — Progress Notes (Signed)
   Patient Saturations on Room Air at Rest = 96%  Patient Saturations on [employer] while Ambulating = 90%

## 2022-06-15 NOTE — Discharge Instructions (Signed)
It was a pleasure taking care of you [name]! You were brought into the hospital because you were vomiting, and your blood pressure was low. We were able to give you some fluids and help get that blood pressure higher. I have added three medications to your regimen: Reglan, Protonix, and Pepcid. The instructions should be on the bottle of the medications. Please go and see your primary care physician as soon as you can! As well as the wound care clinic. Thank you so much.

## 2022-06-15 NOTE — TOC Initial Note (Addendum)
Transition of Care [facility]) - Initial/Assessment Note    Patient Details  Name: [name] MRN: [id number] Date of Birth: April 15, 1941  Transition [facility]) CM/SW Contact:    Carles Collet, RN Phone Number: 06/15/2022, 11:39 AM  Clinical Narrative:                Damaris Schooner w patient at bedside.  She would like to DC to  home when medically stable.   She states that she has an aid at her home M-F for 2 hours and the aid changes her leg dressings a few times a week. She states that she goes to [facility] Monday and gets transportation from [facility]. She estimates that she has about 10-11 rides through them left. She states that her grandkids will assist with transportation after that.  She would like [facility] again, but Janeece Riggers is unable to accept referral at this time. Patient is agreeable for CM to assist with finding any other agency. I have reached out to [facility] and awaiting call back. [facility]/ Nanine Means is able to accept for home health services. Please place orders for home health services PT OT RN SW  Patient states that she has bari 3/1 at home and needs a new bari rollator. Order placed and [employer] called for referral. Patient would like this delivered to her house.  Patient states that family will be able to provide transportation to home.    Expected Discharge Plan: [place] Barriers to Discharge: Continued Medical Work up   Patient Goals and CMS Choice Patient states their goals for this hospitalization and ongoing recovery are:: to return home CMS Medicare.gov Compare Post Acute Care list provided to:: Patient Choice offered to / list presented to : Patient  Expected Discharge Plan and Services Expected Discharge Plan: [place]   Discharge Planning Services: CM Consult Post Acute Care Choice: Durable Medical Equipment, Home Health Living arrangements for the past 2 months: Single Family Home                  DME Arranged: Walker rolling with seat DME Agency: [employer] Date DME Agency Contacted: 06/15/22 Time DME Agency Contacted: 58 Representative spoke with at DME Agency: [place]: PT, OT          Prior Living Arrangements/Services Living arrangements for the past 2 months: [place] with:: Self   Do you feel safe going back to the place where you live?: Yes          Current home services: DME    Activities of Daily Living Home Assistive Devices/Equipment: Gilford Rile (specify type) ADL Screening (condition at time of admission) Patient's cognitive ability adequate to safely complete daily activities?: Yes Is the patient deaf or have difficulty hearing?: No Does the patient have difficulty seeing, even when wearing glasses/contacts?: No Does the patient have difficulty concentrating, remembering, or making decisions?: Yes Patient able to express need for assistance with ADLs?: Yes Does the patient have difficulty dressing or bathing?: Yes Independently performs ADLs?: No Communication: Independent Dressing (OT): Needs assistance Grooming: Needs assistance Feeding: Independent Bathing: Needs assistance Toileting: Needs assistance In/Out Bed: Needs assistance Walks in Home: Independent with device (comment) Does the patient have difficulty walking or climbing stairs?: Yes Weakness of Legs: Both Weakness of Arms/Hands: Both  Permission Sought/Granted                  Emotional Assessment  Admission diagnosis:  Open wound of right lower leg, initial encounter [S81.801A] Sepsis ([place]) [A41.9] Sepsis with acute renal failure without septic shock, due to unspecified organism, unspecified acute renal failure type ([place]) [A41.9, R65.20, N17.9] Patient Active Problem List   Diagnosis Date Noted   Esophagitis with gastritis    Nausea and vomiting    Open wound of right lower leg    Ulcers of both lower legs, limited to  breakdown of skin ([place])    Diarrhea of presumed infectious origin    Sepsis ([place]) 06/03/2022   Colon cancer screening 08/31/2021   Constipation 08/31/2021   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   Flatulence, eructation and gas pain 08/31/2021   Gastroesophageal reflux disease 08/31/2021   Chest pressure 08/18/2021   Palpitations 08/18/2021   Prolonged QT interval 08/18/2021   PNA (pneumonia) 07/16/2021   Community acquired pneumonia 07/16/2021   Hammer toes of both feet 12/31/2020   Onychomycosis due to dermatophyte 12/31/2020   Miosis 10/13/2020   Nuclear sclerotic cataract 10/13/2020   Pressure injury of skin 09/20/2020   Acute on chronic heart failure with preserved ejection fraction ([place]) 09/20/2020   Bacteremia due to Escherichia coli 09/19/2020   Gram-negative sepsis, unspecified ([place]) 09/19/2020   Cellulitis of right leg 07/25/2020   NSTEMI (non-ST elevated myocardial infarction) ([place]) 07/16/2020   Chronic diastolic CHF (congestive heart failure) ([place]) 07/16/2020   Paroxysmal atrial fibrillation ([place]) 07/16/2020   Hypotension 07/16/2020   Body mass index (BMI) 45.0-49.9, adult ([place]) 06/24/2020   Hypokalemia 06/01/2020   Shortness of breath at rest 05/22/2020    Class: Acute   Palpitation 05/22/2020    Class: Acute   Shortness of breath 05/22/2020   Chronic kidney disease-mineral and bone disorder 05/20/2020   Edema 05/20/2020   Gout 05/20/2020   Hypersomnia 05/20/2020   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   Sleep apnea 05/20/2020   Acute left systolic heart failure ([place]) 96/28/3662   Acute systolic heart failure ([place]) 06/18/2019   SSS (sick sinus syndrome) ([place]) 08/01/2015   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   Symptomatic sinus bradycardia 03/27/2014   Pacemaker at end of battery life 03/27/2014   CVA (cerebral infarction) 10/17/2012   Headache(784.0) 10/17/2012   Headache 10/17/2012   Coronary artery disease    Hypertension     Hyperlipemia    Arthritis    CHF (congestive heart failure) ([place])    Vertigo    TIA (transient ischemic attack)    Pacemaker    MI (myocardial infarction) ([place])    Morbid obesity ([place])    PCP:  Rocky Morel, MD Pharmacy:   [place], [place] [address] Phone: [phone] Fax: [phone]  [place], [place] [address] Phone: [phone] Fax: [phone]     Social Determinants of Health (SDOH) Interventions    Readmission Risk Interventions    05/28/2020   10:04 AM  Readmission Risk Prevention Plan  Transportation Screening Complete  PCP or Specialist Appt within 3-5 Days Complete  HRI or [place] Complete  Social Work Consult for [place] Planning/Counseling Complete  Palliative Care Screening Not Applicable  Medication Review Press photographer) Complete

## 2022-06-15 NOTE — Progress Notes (Signed)
Occupational Therapy Treatment Patient Details Name: [name] MRN: [id number] DOB: Mar 28, 1941 Today's Date: 06/15/2022   History of present illness Pt is a 81 year old woman admitted from home on 06/03/22 with abdominal pain, n/v/d, sepsis. GI workup pending given continued N/V. PMH: dCHF, CKD IIb, PAF on Eliquis, SSS s/p pacemaker, HTN, CAD, HLD, CVA, chronic LE wounds, depression, obesity.   OT comments  Pt presented in chair and was able to progress towards goals. Pt at this time was able to complete toilet tasks using BSC and min guard for transfers to and from chair with RW. Pt requires min guard with hygiene post urination and stabilization of BUE forearms on walker. Pt then post set up complete UE hygiene with min assist to clean under breasts to decrease in moisture. Pt needs continued skilled Occupational Therapy to increase in endurance with the return to home.   Recommendations for follow up therapy are one component of a multi-disciplinary discharge planning process, led by the attending physician.  Recommendations may be updated based on patient status, additional functional criteria and insurance authorization.    Follow Up Recommendations  Home health OT    Assistance Recommended at Discharge Frequent or constant Supervision/Assistance  Patient can return home with the following  A lot of help with bathing/dressing/bathroom;Assistance with cooking/housework;Direct supervision/assist for financial management;Assist for transportation;Help with stairs or ramp for entrance;A little help with walking and/or transfers   Equipment Recommendations  BSC/3in1;Wheelchair (measurements OT);Wheelchair cushion (measurements OT)    Recommendations for Other Services      Precautions / Restrictions Precautions Precautions: Fall Restrictions Weight Bearing Restrictions: No       Mobility Bed Mobility Overal bed mobility:  (Pt presented in chair)                   Transfers Overall transfer level: Needs assistance Equipment used: Rolling walker (2 wheels) Transfers: Sit to/from Stand Sit to Stand: Min guard                 Balance Overall balance assessment: Needs assistance Sitting-balance support: Feet supported Sitting balance-Leahy Scale: Fair     Standing balance support: Bilateral upper extremity supported Standing balance-Leahy Scale: Poor Standing balance comment: reliant on external assist                           ADL either performed or assessed with clinical judgement   ADL Overall ADL's : Needs assistance/impaired Eating/Feeding: Independent;Sitting   Grooming: Wash/dry hands;Wash/dry face;Set up;Sitting   Upper Body Bathing: Cueing for safety;Cueing for sequencing;Sitting;Minimal assistance (needed assist under breast)   Lower Body Bathing: Maximal assistance;Total assistance;Cueing for safety;Cueing for sequencing;Sit to/from stand   Upper Body Dressing : Set up;Sitting   Lower Body Dressing: Maximal assistance;Total assistance;Cueing for safety;Sit to/from stand   Toilet Transfer: Min guard;Cueing for safety;Cueing for sequencing;BSC/3in1;Rolling walker (2 wheels)   Toileting- Water quality scientist and Hygiene: Min guard;Sit to/from stand       Functional mobility during ADLs: Min guard;Rolling walker (2 wheels)      Extremity/Trunk Assessment Upper Extremity Assessment Upper Extremity Assessment: Generalized weakness   Lower Extremity Assessment Lower Extremity Assessment: Defer to PT evaluation        Vision       Perception     Praxis      Cognition Arousal/Alertness: Awake/alert Behavior During Therapy: WFL for tasks assessed/performed Overall Cognitive Status: Within Functional Limits for tasks assessed  Exercises      Shoulder Instructions       General Comments      Pertinent Vitals/ Pain       Pain  Assessment Pain Assessment: Faces Faces Pain Scale: Hurts little more Pain Location: BLE Pain Descriptors / Indicators: Discomfort, Grimacing Pain Intervention(s): Limited activity within patient's tolerance, Monitored during session, Repositioned  Home Living                                          Prior Functioning/Environment              Frequency  Min 2X/week        Progress Toward Goals  OT Goals(current goals can now be found in the care plan section)  Progress towards OT goals: Progressing toward goals  Acute Rehab OT Goals OT Goal Formulation: With patient Time For Goal Achievement: 06/18/22 Potential to Achieve Goals: Good ADL Goals Pt Will Perform Grooming: with min assist;standing Pt Will Perform Upper Body Bathing: with min assist;sitting Pt Will Perform Upper Body Dressing: with set-up;sitting Pt Will Transfer to Toilet: with min assist;ambulating;bedside commode Pt Will Perform Toileting - Clothing Manipulation and hygiene: with min assist;sit to/from stand Additional ADL Goal #1: pt will perform bed mobility with min assist in preparation for ADLs. Additional ADL Goal #2: Pt will state at least 3 energy conservation strategies as instructed.  Plan Discharge plan remains appropriate    Co-evaluation                 AM-PAC OT "6 Clicks" Daily Activity     Outcome Measure   Help from another person eating meals?: None Help from another person taking care of personal grooming?: A Little Help from another person toileting, which includes using toliet, bedpan, or urinal?: A Lot Help from another person bathing (including washing, rinsing, drying)?: A Lot Help from another person to put on and taking off regular upper body clothing?: A Little Help from another person to put on and taking off regular lower body clothing?: Total 6 Click Score: 15    End of Session Equipment Utilized During Treatment: Rolling walker (2  wheels)  OT Visit Diagnosis: Unsteadiness on feet (R26.81);Other abnormalities of gait and mobility (R26.89);Muscle weakness (generalized) (M62.81);Pain Pain - part of body:  (BLE)   Activity Tolerance Patient tolerated treatment well   Patient Left in chair;with call bell/phone within reach;with chair alarm set   Nurse Communication  (skin under breasts)        Time: 3976-7341 OT Time Calculation (min): 33 min  Charges: OT General Charges $OT Visit: 1 Visit OT Treatments $Self Care/Home Management : 23-37 mins  Joeseph Amor OTR/L  [place]  [phone] office number [phone] pager number   Joeseph Amor 06/15/2022, 12:01 PM

## 2022-06-15 NOTE — Care Management (Cosign Needed)
    Durable Medical Equipment  (From admission, onward)           Start     Ordered   06/15/22 1137  For home use only DME 4 wheeled rolling walker with seat  Once       Comments: Bariatric needed due to body habitus and weight of 370 pounds  Question:  Patient needs a walker to treat with the following condition  Answer:  Weakness   06/15/22 1137

## 2022-06-16 NOTE — Anesthesia Postprocedure Evaluation (Signed)
Anesthesia Post Note  Patient: [name]  Procedure(s) Performed: ESOPHAGOGASTRODUODENOSCOPY (EGD) WITH PROPOFOL BIOPSY     Patient location during evaluation: PACU Anesthesia Type: MAC Level of consciousness: awake and alert Pain management: pain level controlled Vital Signs Assessment: post-procedure vital signs reviewed and stable Respiratory status: spontaneous breathing, nonlabored ventilation, respiratory function stable and patient connected to nasal cannula oxygen Cardiovascular status: stable and blood pressure returned to baseline Postop Assessment: no apparent nausea or vomiting Anesthetic complications: no   No notable events documented.  Last Vitals:  Vitals:   06/15/22 0838 06/15/22 1129  BP: (!) 155/74 130/77  Pulse: 64 66  Resp: 18 16  Temp: 36.9 C 36.7 C  SpO2: 100% 95%    Last Pain:  Vitals:   06/15/22 1212  TempSrc:   PainSc: Asleep                 Makyiah Lie

## 2022-06-23 NOTE — Progress Notes (Signed)
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

## 2022-08-06 ENCOUNTER — Other Ambulatory Visit: Payer: Self-pay

## 2022-08-06 ENCOUNTER — Observation Stay (HOSPITAL_COMMUNITY): Payer: Medicare Other

## 2022-08-06 ENCOUNTER — Encounter (HOSPITAL_COMMUNITY): Payer: Self-pay

## 2022-08-06 ENCOUNTER — Inpatient Hospital Stay (HOSPITAL_COMMUNITY)
Admission: EM | Admit: 2022-08-06 | Discharge: 2022-08-14 | DRG: 149 | Disposition: A | Discharge status: Home Health | Payer: Medicare Other | Attending: Student in an Organized Health Care Education/Training Program | Admitting: Student in an Organized Health Care Education/Training Program

## 2022-08-06 DIAGNOSIS — E7800 Pure hypercholesterolemia, unspecified: Secondary | ICD-10-CM | POA: Diagnosis present

## 2022-08-06 DIAGNOSIS — H8190 Unspecified disorder of vestibular function, unspecified ear: Secondary | ICD-10-CM

## 2022-08-06 DIAGNOSIS — N179 Acute kidney failure, unspecified: Secondary | ICD-10-CM | POA: Diagnosis present

## 2022-08-06 DIAGNOSIS — H9311 Tinnitus, right ear: Secondary | ICD-10-CM | POA: Diagnosis present

## 2022-08-06 DIAGNOSIS — K5900 Constipation, unspecified: Secondary | ICD-10-CM | POA: Diagnosis present

## 2022-08-06 DIAGNOSIS — I959 Hypotension, unspecified: Secondary | ICD-10-CM | POA: Diagnosis present

## 2022-08-06 DIAGNOSIS — H8111 Benign paroxysmal vertigo, right ear: Secondary | ICD-10-CM | POA: Diagnosis not present

## 2022-08-06 DIAGNOSIS — Z7901 Long term (current) use of anticoagulants: Secondary | ICD-10-CM

## 2022-08-06 DIAGNOSIS — R42 Dizziness and giddiness: Secondary | ICD-10-CM | POA: Diagnosis not present

## 2022-08-06 DIAGNOSIS — I878 Other specified disorders of veins: Secondary | ICD-10-CM | POA: Diagnosis present

## 2022-08-06 DIAGNOSIS — E538 Deficiency of other specified B group vitamins: Secondary | ICD-10-CM | POA: Diagnosis present

## 2022-08-06 DIAGNOSIS — I130 Hypertensive heart and chronic kidney disease with heart failure and stage 1 through stage 4 chronic kidney disease, or unspecified chronic kidney disease: Secondary | ICD-10-CM | POA: Diagnosis present

## 2022-08-06 DIAGNOSIS — Z6841 Body Mass Index (BMI) 40.0 and over, adult: Secondary | ICD-10-CM

## 2022-08-06 DIAGNOSIS — Z8673 Personal history of transient ischemic attack (TIA), and cerebral infarction without residual deficits: Secondary | ICD-10-CM

## 2022-08-06 DIAGNOSIS — I2510 Atherosclerotic heart disease of native coronary artery without angina pectoris: Secondary | ICD-10-CM | POA: Diagnosis present

## 2022-08-06 DIAGNOSIS — N1832 Chronic kidney disease, stage 3b: Secondary | ICD-10-CM | POA: Diagnosis present

## 2022-08-06 DIAGNOSIS — M793 Panniculitis, unspecified: Secondary | ICD-10-CM | POA: Diagnosis present

## 2022-08-06 DIAGNOSIS — L304 Erythema intertrigo: Secondary | ICD-10-CM | POA: Diagnosis present

## 2022-08-06 DIAGNOSIS — K219 Gastro-esophageal reflux disease without esophagitis: Secondary | ICD-10-CM | POA: Diagnosis present

## 2022-08-06 DIAGNOSIS — H8110 Benign paroxysmal vertigo, unspecified ear: Secondary | ICD-10-CM | POA: Diagnosis present

## 2022-08-06 DIAGNOSIS — I495 Sick sinus syndrome: Secondary | ICD-10-CM | POA: Diagnosis present

## 2022-08-06 DIAGNOSIS — I872 Venous insufficiency (chronic) (peripheral): Secondary | ICD-10-CM | POA: Diagnosis present

## 2022-08-06 DIAGNOSIS — Z79899 Other long term (current) drug therapy: Secondary | ICD-10-CM

## 2022-08-06 DIAGNOSIS — Z950 Presence of cardiac pacemaker: Secondary | ICD-10-CM

## 2022-08-06 DIAGNOSIS — R112 Nausea with vomiting, unspecified: Secondary | ICD-10-CM

## 2022-08-06 DIAGNOSIS — I739 Peripheral vascular disease, unspecified: Secondary | ICD-10-CM | POA: Diagnosis present

## 2022-08-06 DIAGNOSIS — Z888 Allergy status to other drugs, medicaments and biological substances status: Secondary | ICD-10-CM

## 2022-08-06 DIAGNOSIS — G8929 Other chronic pain: Secondary | ICD-10-CM | POA: Diagnosis present

## 2022-08-06 DIAGNOSIS — D539 Nutritional anemia, unspecified: Secondary | ICD-10-CM | POA: Diagnosis present

## 2022-08-06 DIAGNOSIS — Z880 Allergy status to penicillin: Secondary | ICD-10-CM

## 2022-08-06 DIAGNOSIS — K3184 Gastroparesis: Secondary | ICD-10-CM | POA: Diagnosis present

## 2022-08-06 DIAGNOSIS — M170 Bilateral primary osteoarthritis of knee: Secondary | ICD-10-CM | POA: Diagnosis present

## 2022-08-06 DIAGNOSIS — I5032 Chronic diastolic (congestive) heart failure: Secondary | ICD-10-CM | POA: Diagnosis present

## 2022-08-06 DIAGNOSIS — D696 Thrombocytopenia, unspecified: Secondary | ICD-10-CM | POA: Diagnosis present

## 2022-08-06 DIAGNOSIS — I252 Old myocardial infarction: Secondary | ICD-10-CM

## 2022-08-06 DIAGNOSIS — I480 Paroxysmal atrial fibrillation: Secondary | ICD-10-CM | POA: Diagnosis present

## 2022-08-06 LAB — CBC WITH DIFFERENTIAL/PLATELET
Abs Immature Granulocytes: 0.02 10*3/uL (ref 0.00–0.07)
Basophils Absolute: 0.1 10*3/uL (ref 0.0–0.1)
Basophils Relative: 1 %
Eosinophils Absolute: 0.2 10*3/uL (ref 0.0–0.5)
Eosinophils Relative: 4 %
HCT: 41.5 % (ref 36.0–46.0)
Hemoglobin: 12.9 g/dL (ref 12.0–15.0)
Immature Granulocytes: 1 %
Lymphocytes Relative: 28 %
Lymphs Abs: 1.2 10*3/uL (ref 0.7–4.0)
MCH: 32.5 pg (ref 26.0–34.0)
MCHC: 31.1 g/dL (ref 30.0–36.0)
MCV: 104.5 fL — ABNORMAL HIGH (ref 80.0–100.0)
Monocytes Absolute: 0.5 10*3/uL (ref 0.1–1.0)
Monocytes Relative: 11 %
Neutro Abs: 2.5 10*3/uL (ref 1.7–7.7)
Neutrophils Relative %: 55 %
Platelets: 151 10*3/uL (ref 150–400)
RBC: 3.97 MIL/uL (ref 3.87–5.11)
RDW: 17.2 % — ABNORMAL HIGH (ref 11.5–15.5)
WBC: 4.4 10*3/uL (ref 4.0–10.5)
nRBC: 0.5 % — ABNORMAL HIGH (ref 0.0–0.2)

## 2022-08-06 LAB — I-STAT CHEM 8, ED
BUN: 23 mg/dL (ref 8–23)
Calcium, Ion: 1.14 mmol/L — ABNORMAL LOW (ref 1.15–1.40)
Chloride: 105 mmol/L (ref 98–111)
Creatinine, Ser: 1.8 mg/dL — ABNORMAL HIGH (ref 0.44–1.00)
Glucose, Bld: 122 mg/dL — ABNORMAL HIGH (ref 70–99)
HCT: 43 % (ref 36.0–46.0)
Hemoglobin: 14.6 g/dL (ref 12.0–15.0)
Potassium: 3.9 mmol/L (ref 3.5–5.1)
Sodium: 145 mmol/L (ref 135–145)
TCO2: 33 mmol/L — ABNORMAL HIGH (ref 22–32)

## 2022-08-06 LAB — I-STAT VENOUS BLOOD GAS, ED
Acid-Base Excess: 5 mmol/L — ABNORMAL HIGH (ref 0.0–2.0)
Bicarbonate: 32.4 mmol/L — ABNORMAL HIGH (ref 20.0–28.0)
Calcium, Ion: 1.16 mmol/L (ref 1.15–1.40)
HCT: 41 % (ref 36.0–46.0)
Hemoglobin: 13.9 g/dL (ref 12.0–15.0)
O2 Saturation: 45 %
Potassium: 4 mmol/L (ref 3.5–5.1)
Sodium: 145 mmol/L (ref 135–145)
TCO2: 34 mmol/L — ABNORMAL HIGH (ref 22–32)
pCO2, Ven: 57.7 mmHg (ref 44–60)
pH, Ven: 7.357 (ref 7.25–7.43)
pO2, Ven: 27 mmHg — CL (ref 32–45)

## 2022-08-06 LAB — BASIC METABOLIC PANEL
Anion gap: 9 (ref 5–15)
BUN: 18 mg/dL (ref 8–23)
CO2: 30 mmol/L (ref 22–32)
Calcium: 8.8 mg/dL — ABNORMAL LOW (ref 8.9–10.3)
Chloride: 104 mmol/L (ref 98–111)
Creatinine, Ser: 1.83 mg/dL — ABNORMAL HIGH (ref 0.44–1.00)
GFR, Estimated: 28 mL/min — ABNORMAL LOW (ref >60)
Glucose, Bld: 128 mg/dL — ABNORMAL HIGH (ref 70–99)
Potassium: 3.9 mmol/L (ref 3.5–5.1)
Sodium: 143 mmol/L (ref 135–145)

## 2022-08-06 LAB — MAGNESIUM: Magnesium: 2 mg/dL (ref 1.7–2.4)

## 2022-08-06 LAB — LIPASE, BLOOD: Lipase: 23 U/L (ref 11–51)

## 2022-08-06 MED ORDER — DIAZEPAM 2 MG PO TABS
2.0000 mg | ORAL_TABLET | Freq: Once | ORAL | Status: AC
  Administered 2022-08-06: 2 mg via ORAL
  Filled 2022-08-06: qty 1

## 2022-08-06 MED ORDER — METOCLOPRAMIDE HCL 5 MG PO TABS
2.5000 mg | ORAL_TABLET | Freq: Three times a day (TID) | ORAL | Status: DC
  Administered 2022-08-07 – 2022-08-10 (×10): 2.5 mg via ORAL
  Filled 2022-08-06 (×14): qty 0.5

## 2022-08-06 MED ORDER — DOXERCALCIFEROL 0.5 MCG PO CAPS
0.5000 ug | ORAL_CAPSULE | Freq: Every day | ORAL | Status: DC
  Administered 2022-08-07 – 2022-08-14 (×7): 0.5 ug via ORAL
  Filled 2022-08-06 (×8): qty 1

## 2022-08-06 MED ORDER — SODIUM CHLORIDE 0.9 % IV BOLUS
500.0000 mL | Freq: Once | INTRAVENOUS | Status: AC
Start: 2022-08-06 — End: 2022-08-06
  Administered 2022-08-06: 500 mL via INTRAVENOUS

## 2022-08-06 MED ORDER — FENTANYL CITRATE (PF) 100 MCG/2ML IJ SOLN
INTRAMUSCULAR | Status: AC
  Filled 2022-08-06: qty 2

## 2022-08-06 MED ORDER — MUPIROCIN 2 % EX OINT
1.0000 | TOPICAL_OINTMENT | Freq: Two times a day (BID) | CUTANEOUS | Status: DC
  Administered 2022-08-07 – 2022-08-14 (×15): 1 via TOPICAL
  Filled 2022-08-06: qty 22

## 2022-08-06 MED ORDER — LOSARTAN POTASSIUM 50 MG PO TABS
100.0000 mg | ORAL_TABLET | Freq: Every day | ORAL | Status: DC
  Administered 2022-08-07 – 2022-08-08 (×2): 100 mg via ORAL
  Filled 2022-08-06 (×3): qty 2

## 2022-08-06 MED ORDER — ENOXAPARIN SODIUM 80 MG/0.8ML IJ SOSY
76.0000 mg | PREFILLED_SYRINGE | INTRAMUSCULAR | Status: DC
  Administered 2022-08-07 – 2022-08-08 (×3): 75 mg via SUBCUTANEOUS
  Filled 2022-08-06: qty 0.75
  Filled 2022-08-06 (×2): qty 0.8
  Filled 2022-08-06: qty 0.75

## 2022-08-06 MED ORDER — POLYETHYLENE GLYCOL 3350 17 G PO PACK
17.0000 g | PACK | Freq: Every day | ORAL | Status: DC
  Administered 2022-08-07 – 2022-08-14 (×4): 17 g via ORAL
  Filled 2022-08-06 (×7): qty 1

## 2022-08-06 MED ORDER — METOPROLOL SUCCINATE ER 100 MG PO TB24
100.0000 mg | ORAL_TABLET | Freq: Every day | ORAL | Status: DC
  Administered 2022-08-07 – 2022-08-09 (×3): 100 mg via ORAL
  Filled 2022-08-06 (×3): qty 1
  Filled 2022-08-06: qty 4

## 2022-08-06 MED ORDER — OXYCODONE-ACETAMINOPHEN 5-325 MG PO TABS
1.0000 | ORAL_TABLET | Freq: Three times a day (TID) | ORAL | Status: DC | PRN
  Administered 2022-08-07 – 2022-08-13 (×4): 1 via ORAL
  Filled 2022-08-06 (×4): qty 1

## 2022-08-06 MED ORDER — MECLIZINE HCL 25 MG PO TABS
50.0000 mg | ORAL_TABLET | Freq: Once | ORAL | Status: AC
  Administered 2022-08-06: 50 mg via ORAL
  Filled 2022-08-06: qty 2

## 2022-08-06 MED ORDER — SENNOSIDES-DOCUSATE SODIUM 8.6-50 MG PO TABS: 1.0000 | ORAL_TABLET | Freq: Every evening | ORAL | Status: DC | PRN

## 2022-08-06 MED ORDER — ZINC OXIDE 40 % EX OINT: TOPICAL_OINTMENT | Freq: Three times a day (TID) | CUTANEOUS | Status: DC | PRN

## 2022-08-06 MED ORDER — METOCLOPRAMIDE HCL 5 MG/ML IJ SOLN
10.0000 mg | Freq: Once | INTRAMUSCULAR | Status: AC
  Administered 2022-08-06: 10 mg via INTRAVENOUS
  Filled 2022-08-06: qty 2

## 2022-08-06 MED ORDER — MECLIZINE HCL 25 MG PO TABS
25.0000 mg | ORAL_TABLET | Freq: Three times a day (TID) | ORAL | Status: DC | PRN
  Administered 2022-08-07 – 2022-08-12 (×6): 25 mg via ORAL
  Filled 2022-08-06 (×8): qty 1

## 2022-08-06 MED ORDER — ALLOPURINOL 100 MG PO TABS
300.0000 mg | ORAL_TABLET | Freq: Every day | ORAL | Status: DC
  Administered 2022-08-07 – 2022-08-14 (×7): 300 mg via ORAL
  Filled 2022-08-06 (×6): qty 3
  Filled 2022-08-06: qty 1
  Filled 2022-08-06: qty 3

## 2022-08-06 MED ORDER — ROSUVASTATIN CALCIUM 5 MG PO TABS
10.0000 mg | ORAL_TABLET | Freq: Every day | ORAL | Status: DC
  Administered 2022-08-07 – 2022-08-14 (×9): 10 mg via ORAL
  Filled 2022-08-06 (×9): qty 2

## 2022-08-06 MED ORDER — PANTOPRAZOLE SODIUM 40 MG PO TBEC
40.0000 mg | DELAYED_RELEASE_TABLET | Freq: Every day | ORAL | Status: DC
  Administered 2022-08-07 – 2022-08-14 (×7): 40 mg via ORAL
  Filled 2022-08-06 (×8): qty 1

## 2022-08-06 NOTE — ED Notes (Signed)
Pt asleep. Will request UA when bolus is complete.

## 2022-08-06 NOTE — ED Notes (Signed)
Pt states she is still to dizzy to get up to use the restroom. Pt states her nausea is better.

## 2022-08-06 NOTE — Hospital Course (Addendum)
[name] is a 81 yo F with a PMH of HFpEF (last TTE 07/19/2021 EF 55%), CKD IIIb, CAD, SSS s/p pacemaker, PVD, paroxysmal atrial fibrillation on eliquis, OSA, CVA, GERD, HLD, HTN who presents to the ED for vertigo.    Patient states that on the day of admission she woke up in the a.m. and when she went from the supine to seated position she began experiencing dizziness.  She states that she is still to walk to the restroom but unfortunately had to sit back down because of her symptoms.  When she lie down her symptoms did not get any better.  She called her nephew, who gave her some water.  She states that she got nauseous and vomited approximately 4-5 times.  After the episodes of emesis, she decided to present to the emergency department for further evaluation.  She states that she has had some associated tinnitus, that went away after a few hours, and a frontal headache that persisted along with her vertigo.  She did note some occasional light sensitivity but otherwise denied changes in her vision or any new weakness.  She has had an episode like this in the past, about 11 years ago.  She states that at that time she was discharged with meclizine.  She also notes that she intermittently gets dizzy with standing, but states if she moves from the seated to standing position slowly or briefly pauses when she is standing, this usually completely resolves.  Patient denies fever, chest pain, dyspnea, constipation, or diarrhea.  She also denies recent illness or sick contacts or difficulty eating or drinking prior to this episode.    Threw up 4-5x times. Came hin because of this. Feels like she can stand up now, so improved since being here.  But dizziness still present. Had a ringing sound in her right ear. That went away.  Lasted for a few hours.    Short of breath. Which resolved. Had a headache with this episode. Still have headache. Started when she sat up this morning. Frontal  headache. No changes in vision. No chest pain. Light sensitivity. No floaters. Eyes burnign and itching.  Had diarrhea  had 2 bowel movements (loose).  One since here.   Usually one time a day.  Chills no fever (has these some times).    2013 had an episode. Stayed in the hospital for wo weeks. Lasted for two weeks. Didn't really know what caused it. When she went home lasted for another week or so but was much better then eventually completely remitted.   Has had other episodes of this but they tend to go away with her meclizine.   Eating and drinking okay before this current epside.  Sent home on meclizine continues to take this medicaiton.   No one else sick. In the house.   Before this episode would sometiems get dizzy with standing. Couldn't stand up.   Peripheral vertigo  This AM got out of bed   H/o gastroparesis prone to vomiting complaints  H/o vertigo.   Meclizine valium, reglan still requiring 2 person   Persistent vertigo   Has paceamker.   Can stand leaning against the bed.   Medications:   Patient is unclear the exact medication she takes, noting that she receives pill packs from [facility].   We will contact them in the a.m. to verify her medications.    PSH:    SH:  Nephrew lives with patient  Daughter lives in  [place] No cigarette use  No alcohol    Allergies:   Code status: Full   Dr Doylene Canard saw last month    10/14: Has vomited once since arrival to the ED. States the room is not spinning anymore. When she sits up dizziness occurs. Happened in 2014 w/ vertigo. Was able to eat a sandwich and kept it down. No nystagmus present, eyes are still. Dysconjugate gaze present when patient is looking up. Lights are more medial to on her right eye Tinnitus present in R ear and feels more dizzy when turn head to the left. Finger to nose testing improved on R hand. Pt complaining of chest pain, tsomach pain, shortness of breath. Pt noticed  decreased functionability with ambulation leading up to ed presentation. Hesitant to enter nursing home for fear of COVID. Pt has nephew at home to help with everything, and is present around the clock. Has one aid for week days, a different one on Saturday for two hours. Nephew helps with needs when aids not there. Does not have proper walker, needs a regular one, two wheeled one is the one she wants.

## 2022-08-06 NOTE — ED Triage Notes (Signed)
EMS stated, she is dizzy  with N/V it started last night. She was here for a month with Sepsis Zofran 4mg  sl

## 2022-08-06 NOTE — ED Provider Notes (Signed)
[facility] EMERGENCY DEPARTMENT Provider Note   CSN: [id number] Arrival date & time: 08/06/22  0734     History  CC: Dizzy, vertigo   [name] is a 81 y.o. female presented to ED with an onset of vertigo, nausea and vomiting.  The patient reports that she woke up around 4 AM with vertigo, feeling the room spinning around her, worse with head movement.  She says she felt very nauseated with her vertigo and has had some dry heaving.  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  She was initially diagnosed with sepsis syndrome but sepsis was ultimately ruled out as blood cultures were no growth.  She has a wound care nurse come out to the house to treat her chronic venous stasis ulcerations of the lower legs.  HPI     Home Medications Prior to Admission medications   Medication Sig Start Date End Date Taking? Authorizing Provider  allopurinol (ZYLOPRIM) 300 MG tablet Take 300 mg by mouth daily. 05/18/22  Yes [provider]  amiodarone (PACERONE) 200 MG tablet Take 200 mg by mouth daily.   Yes [provider]  amLODipine (NORVASC) 5 MG tablet Take 5 mg by mouth daily. 06/22/22  Yes [provider]  apixaban (ELIQUIS) 5 MG TABS tablet Take 5 mg by mouth 2 (two) times daily. 02/11/17  Yes [provider]  doxercalciferol (HECTOROL) 0.5 MCG capsule Take 0.5 mcg by mouth daily. 05/18/22  Yes [provider]  famotidine (PEPCID) 20 MG tablet Take 1 tablet (20 mg total) by mouth 2 (two) times daily. 06/15/22  Yes Iona Beard, MD  FARXIGA 10 MG TABS tablet Take 10 mg by mouth daily. 05/18/22  Yes [provider]  isosorbide mononitrate (IMDUR) 30 MG 24 hr tablet Take 1 tablet (30 mg total) by mouth daily. 07/23/21  Yes Mercy Riding, MD  liver oil-zinc oxide (DESITIN) 40 % ointment  Apply topically 3 (three) times daily as needed for irritation. 06/15/22  Yes Nooruddin, Marlene Lard, MD  losartan (COZAAR) 100 MG tablet Take 100 mg by mouth daily. 05/18/22  Yes [provider]  meclizine (ANTIVERT) 25 MG tablet Take 25 mg by mouth 3 (three) times daily as needed for dizziness. 05/18/22  Yes [provider]  metoCLOPramide (REGLAN) 5 MG tablet Take 0.5 tablets (2.5 mg total) by mouth 3 (three) times daily before meals. 06/15/22  Yes Iona Beard, MD  metoprolol succinate (TOPROL-XL) 100 MG 24 hr tablet Take 100 mg by mouth daily.   Yes [provider]  mupirocin ointment (BACTROBAN) 2 % Apply 1 Application topically 2 (two) times daily. 05/31/22  Yes [provider]  oxyCODONE-acetaminophen (PERCOCET/ROXICET) 5-325 MG tablet Take 1 tablet by mouth 3 (three) times daily as needed. 07/23/22  Yes [provider]  pantoprazole (PROTONIX) 40 MG tablet Take 1 tablet (40 mg total) by mouth 2 (two) times daily. 06/15/22  Yes Iona Beard, MD  polyethylene glycol (MIRALAX / GLYCOLAX) 17 g packet Take 17 g by mouth daily as needed for mild constipation.   Yes [provider]  rosuvastatin (CRESTOR) 10 MG tablet Take 10 mg by mouth at bedtime.   Yes [provider]  silver sulfADIAZINE (SILVADENE) 1 % cream Apply 1 Application topically 2 (two) times daily. 05/31/22  Yes [provider]  torsemide (DEMADEX) 20 MG tablet Take 2 tablets (40  mg total) by mouth daily. 07/24/21  Yes Mercy Riding, MD  famotidine (PEPCID) 40 MG tablet Take 40 mg by mouth daily. 11/29/19   [provider]  Potassium Chloride ER 20 MEQ TBCR Take 1 tablet by mouth 2 (two) times daily. 07/13/22   [provider]  potassium chloride SA (KLOR-CON) 20 MEQ tablet Take 20 mEq by mouth 2 (two) times daily. 07/17/21   [provider]  sulfamethoxazole-trimethoprim (BACTRIM DS) 800-160 MG tablet Take 1 tablet by mouth 2 (two) times daily. 07/12/22    [provider]      Allergies    Omnipen [ampicillin], Penicillins, and Linzess [linaclotide]    Review of Systems   Review of Systems  Physical Exam Updated Vital Signs BP 134/77 (BP Location: Right Wrist)   Pulse 60   Temp 98.1 F (36.7 C) (Oral)   Resp 16   Ht 5\' 6"  (1.676 m)   Wt (!) 152.9 kg   SpO2 96%   BMI 54.39 kg/m  Physical Exam Constitutional:      General: She is not in acute distress.    Appearance: She is obese.  HENT:     Head: Normocephalic and atraumatic.  Eyes:     Conjunctiva/sclera: Conjunctivae normal.     Pupils: Pupils are equal, round, and reactive to light.  Cardiovascular:     Rate and Rhythm: Normal rate and regular rhythm.  Pulmonary:     Effort: Pulmonary effort is normal. No respiratory distress.  Abdominal:     General: There is no distension.     Tenderness: There is no abdominal tenderness.  Musculoskeletal:     Comments: Venous stasis ulcers of the lower extremity  Skin:    General: Skin is warm and dry.  Neurological:     Mental Status: She is alert. Mental status is at baseline.     Comments: Patient has unilateral rightward beating nystagmus No other acute neurological findings or deficits  Psychiatric:        Mood and Affect: Mood normal.        Behavior: Behavior normal.     ED Results / Procedures / Treatments   Labs (all labs ordered are listed, but only abnormal results are displayed) Labs Reviewed  BASIC METABOLIC PANEL - Abnormal; Notable for the following components:      Result Value   Glucose, Bld 128 (*)    Creatinine, Ser 1.83 (*)    Calcium 8.8 (*)    GFR, Estimated 28 (*)    All other components within normal limits  CBC WITH DIFFERENTIAL/PLATELET - Abnormal; Notable for the following components:   MCV 104.5 (*)    RDW 17.2 (*)    nRBC 0.5 (*)    All other components within normal limits  I-STAT VENOUS BLOOD GAS, ED - Abnormal; Notable for the following components:   pO2, Ven 27 (*)     Bicarbonate 32.4 (*)    TCO2 34 (*)    Acid-Base Excess 5.0 (*)    All other components within normal limits  I-STAT CHEM 8, ED - Abnormal; Notable for the following components:   Creatinine, Ser 1.80 (*)    Glucose, Bld 122 (*)    Calcium, Ion 1.14 (*)    TCO2 33 (*)    All other components within normal limits  LIPASE, BLOOD  MAGNESIUM  URINALYSIS, ROUTINE W REFLEX MICROSCOPIC    EKG EKG Interpretation  Date/Time:  Friday August 06 2022 07:47:38 EDT Ventricular Rate:  60 PR Interval:  334 QRS Duration: 202 QT Interval:  524 QTC Calculation: 524 R Axis:   -53 Text Interpretation: Atrial-paced rhythm with prolonged AV conduction Left axis deviation Left bundle branch block Abnormal ECG When compared with ECG of 15-Jun-2022 06:54, PREVIOUS ECG IS PRESENT No sig changes Confirmed by Octaviano Glow [phone]) on 08/06/2022 8:12:46 AM  Radiology No results found.  Procedures Procedures    Medications Ordered in ED Medications  meclizine (ANTIVERT) tablet 50 mg (50 mg Oral Given 08/06/22 0918)  metoCLOPramide (REGLAN) injection 10 mg (10 mg Intravenous Given 08/06/22 0921)  sodium chloride 0.9 % bolus 500 mL ( Intravenous Stopped 08/06/22 1020)  fentaNYL (SUBLIMAZE) 100 MCG/2ML injection (  Return to [facility] 08/06/22 0943)  diazepam (VALIUM) tablet 2 mg (2 mg Oral Given 08/06/22 1055)    ED Course/ Medical Decision Making/ A&P Clinical Course as of 08/06/22 1427  Fri Aug 06, 2022  1349 After multiple attempts ambulate the patient is still not able to rise or ambulate.  Continues to complain of significant dizziness and imbalance [MT]  1421 Signed out to IM hospital service. [MT]    Clinical Course User Index [MT] Stephano Arrants, Carola Rhine, MD                           Medical Decision Making Amount and/or Complexity of Data Reviewed Labs: ordered.  Risk Prescription drug management. Decision regarding hospitalization.   This patient presents to the ED with concern for  vertigo, nausea, vomiting. This involves an extensive number of treatment options, and is a complaint that carries with it a high risk of complications and morbidity.  The differential diagnosis includes peripheral vertigo vs infection vs metabolic derangement vs other  I suspect that her nausea and vomiting is likely related to her vertigo.  She is having difficult time even opening her eyes due to the intensity of her symptoms.  Her exam and her history are more consistent with peripheral vertigo than central vertigo.  She has had similar episodes in the past.  Her symptoms began upon waking up out of bed.  They are worse with any type of head movement.  She does not demonstrate ataxia or other central brain lesion symptoms on exam.  We will try Valium, meclizine, IV fluids, as well as Reglan for her nausea and vertigo symptoms.  Co-morbidities that complicate the patient evaluation: Patient has a history of idiopathic gastroparesis and a high risk for recurring vomiting  Additional history obtained from EMS  External records from outside source obtained and reviewed including  Most recent hospitalization course and discharge summary from August 2023,  I ordered and personally interpreted labs.  The pertinent results include: No acidosis, no anion gap, creatinine at baseline at 1.8, BUN within normal limits.  No leukocytosis or acute anemia.  Electrolytes are unremarkable.  She had no focal findings to warrant emergent imaging of the chest abdomen pelvis at this time, or neuroimaging of the brain  She does not appear clinically dehydrated, and does not show signs of sepsis or infection at this time.  Per my interpretation, the patient's ECG shows atrial paced rhythm with no acute ischemic findings; no significant changes from her prior tracings   After the interventions noted above, I reevaluated the patient and found that they have: improved  Overall her dizziness and nausea have  improved somewhat, but she is still unable to ambulate without two-person assist, and is still feeling nauseated.  Therefore she will be admitted to the hospital for observation while continue medications were given for suspected peripheral vertigo  Dispostion:  After consideration of the diagnostic results and the patients response to treatment, I feel that the patent would benefit from observation admission         Final Clinical Impression(s) / ED Diagnoses Final diagnoses:  Vertigo  Nausea and vomiting, unspecified vomiting type    Rx / DC Orders ED Discharge Orders     None         Wyvonnia Dusky, MD 08/06/22 1427

## 2022-08-06 NOTE — ED Notes (Signed)
Attempted to ambulate pt. Pt still c/o dizziness while sitting and standing next to bed. Pt was able to stand for ~2 minutes without dizziness subsiding.

## 2022-08-06 NOTE — H&P (Signed)
Date: 08/06/2022               Patient Name:  [name] MRN: [id number]  DOB: 08-19-1941 Age / Sex: 81 y.o., female   PCP: Rocky Morel, MD         Medical Service: Internal Medicine Teaching Service         Attending Physician: Dr. Jimmye Norman, Elaina Pattee, MD    First Contact: Dr. Linward Natal  Pager: [phone]  Second Contact: Dr. Rick Duff Pager: [phone]       After Hours (After 5p/  First Contact Pager: [phone]  weekends / holidays): Second Contact Pager: [phone]   Chief Complaint: Vertigo  History of Present Illness:   [name] is a 81 yo F with a PMH of HFpEF (last TTE 07/19/2021 EF 55%), CKD IIIb, CAD, SSS s/p pacemaker, PVD, paroxysmal atrial fibrillation on eliquis, OSA, CVA, GERD, HLD, HTN who presents to the ED for vertigo.    Patient states that on the day of admission she woke up in the a.m. and when she went from the supine to seated position she began experiencing dizziness.  She states that she is still to walk to the restroom but unfortunately had to sit back down because of her symptoms.  When she lie down her symptoms did not get any better.  She called her nephew, who gave her some water.  She states that she got nauseous and vomited approximately 4-5 times.  After the episodes of emesis, she decided to present to the emergency department for further evaluation.  She states that she has had some associated tinnitus, that went away after a few hours, and a frontal headache that persisted along with her vertigo.  She did note some occasional light sensitivity but otherwise denied changes in her vision or any new weakness.  She has had an episode like this in the past, about 11 years ago.  She states that at that time she was discharged with meclizine.  She also notes that she intermittently gets dizzy with standing, but states if she moves from the seated to standing position slowly or briefly pauses when she is  standing, this usually completely resolves.  Patient denies fever, chest pain, dyspnea, constipation, or diarrhea.  She also denies recent illness or sick contacts or difficulty eating or drinking prior to this episode.  Meds:   Patient is unclear the exact medication she takes, noting that she receives pill packs from [place].   We will contact them in the a.m. to verify her medications.  Current Meds  Medication Sig   allopurinol (ZYLOPRIM) 300 MG tablet Take 300 mg by mouth daily.   amiodarone (PACERONE) 200 MG tablet Take 200 mg by mouth daily.   amLODipine (NORVASC) 5 MG tablet Take 5 mg by mouth daily.   apixaban (ELIQUIS) 5 MG TABS tablet Take 5 mg by mouth 2 (two) times daily.   doxercalciferol (HECTOROL) 0.5 MCG capsule Take 0.5 mcg by mouth daily.   famotidine (PEPCID) 20 MG tablet Take 1 tablet (20 mg total) by mouth 2 (two) times daily.   famotidine (PEPCID) 40 MG tablet Take 40 mg by mouth daily.   FARXIGA 10 MG TABS tablet Take 10 mg by mouth daily.   isosorbide mononitrate (IMDUR) 30 MG 24 hr tablet Take 1 tablet (30 mg total) by mouth daily.   liver oil-zinc oxide (DESITIN) 40 % ointment Apply topically 3 (three) times daily as needed  for irritation.   losartan (COZAAR) 100 MG tablet Take 100 mg by mouth daily.   meclizine (ANTIVERT) 25 MG tablet Take 25 mg by mouth 3 (three) times daily as needed for dizziness.   metoCLOPramide (REGLAN) 5 MG tablet Take 0.5 tablets (2.5 mg total) by mouth 3 (three) times daily before meals.   metoprolol succinate (TOPROL-XL) 100 MG 24 hr tablet Take 100 mg by mouth daily.   mupirocin ointment (BACTROBAN) 2 % Apply 1 Application topically 2 (two) times daily.   oxyCODONE-acetaminophen (PERCOCET/ROXICET) 5-325 MG tablet Take 1 tablet by mouth 3 (three) times daily as needed.   pantoprazole (PROTONIX) 40 MG tablet Take 1 tablet (40 mg total) by mouth 2 (two) times daily.   polyethylene glycol (MIRALAX / GLYCOLAX) 17 g packet Take  17 g by mouth daily as needed for mild constipation.   Potassium Chloride ER 20 MEQ TBCR Take 1 tablet by mouth 2 (two) times daily.   potassium chloride SA (KLOR-CON) 20 MEQ tablet Take 20 mEq by mouth 2 (two) times daily.   rosuvastatin (CRESTOR) 10 MG tablet Take 10 mg by mouth at bedtime.   silver sulfADIAZINE (SILVADENE) 1 % cream Apply 1 Application topically 2 (two) times daily.   sulfamethoxazole-trimethoprim (BACTRIM DS) 800-160 MG tablet Take 1 tablet by mouth 2 (two) times daily.   torsemide (DEMADEX) 20 MG tablet Take 2 tablets (40 mg total) by mouth daily.     Allergies: Allergies as of 08/06/2022 - Review Complete 08/06/2022  Allergen Reaction Noted   Omnipen [ampicillin] Other (See Comments) 05/23/2011   Penicillins Other (See Comments) 05/23/2011   Linzess [linaclotide] Other (See Comments) 05/22/2020   Past Medical History:  Diagnosis Date   Arthritis    CHF (congestive heart failure) ([place])    05/2011 echo severe concentric LVH, normal systolic fxn, grade 1 diastolic dysfxn, paradoxical septal motion   Chronic kidney disease-mineral and bone disorder 05/20/2020   Coronary artery disease    Cath 04/2006 normal coronaries, mild-mod slow flow in all coronaries with slow distall runoff, mild-mod LV enlargement, EF 55%    Depression    Dyspnea    Dysrhythmia    GERD (gastroesophageal reflux disease)    Hypercholesterolemia    Hyperlipemia    Hypertension    MI (myocardial infarction) ([place])    NOT SURE HOW MANY   Morbid obesity (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 No pertinent family history.   Social History:  Social History   Tobacco Use    Smoking status: Never   Smokeless tobacco: Never  Vaping Use   Vaping Use: Never used  Substance Use Topics   Alcohol use: No   Drug use: No     Review of Systems: A complete ROS was negative except as per HPI.   Physical Exam: Blood pressure 127/73, pulse 61, temperature 98 F (36.7 C), temperature source Oral, resp. rate 16, height 5\' 6"  (1.676 m), weight (!) 152.9 kg, SpO2 98 %. Physical Exam  Constitutional: Chronically ill appearing, lying in bed with eyes closed, and in  no distress.  HENT:  Head: Normocephalic and atraumatic.  Eyes: EOM are normal. No nystagmus appreciated  Neck: Normal range of motion.  Cardiovascular: Paced. No gallop and no friction rub.  No murmur heard. Trace edema of BLE Pulmonary: Non labored breathing on room air, no wheezing or rales  Abdominal: Soft. Normal bowel sounds. Non distended and non tender. Obese, pannus folds with  Musculoskeletal: Normal range of motion.        General: No tenderness  Neurological: Alert and oriented to person, place, and time. Non focal. HINTS and dix hallpike preclude by patient's symptoms. Abnormal FTN test.  Skin: Skin is warm and dry. BLE with lichenification of the skin bilaterally, RLE with ulceration on the anterior aspect that is healing appropriately    EKG: personally reviewed my interpretation is atrial paced   CT head without contrast: No acute intracranial findings.   Assessment & Plan by Problem: Principal Problem:   Vertigo Ms. Carigan Lister is a 81 yo F with a PMH of HFpEF (last TTE 07/19/2021 EF 55%), CKD IIIb, CAD, SSS s/p pacemaker, PVD, paroxysmal atrial fibrillation on eliquis, OSA, CVA, GERD, HLD, HTN who presents to the ED for vertigo.    #Vertigo  Unclear exact etiology, likely multifactorial in nature. Patient is on multiple medications for her heart that can cause orthostasis. However, she has been on these chronically and does not note any recent changes. Given patient's  description of vertigo precipitated by changing positions, peripheral causes of vertigo could be causing her symptoms. She could have BPPV or Menieres disease given he associated tinnitus. Finally, considered central causes of vertigo given the persistent nature of her symptoms. She did have difficulty with FTN but stated that she was having difficulty seeing due to her dizziness. Unfortunately patient cannot undergo an MRI of her brain due to her pacemaker.  (From a previous note, but this will need to be verified). A CT head w/o contrast showed no evidence of hemorrhage or infarct. However, these are not sensitive for ischemic strokes, particularly strokes involving the posterior circulation.  -Orthostatic vitals  -Continue meclizine  -PT, vestibular PT -Antiemetics  -F/u w/ pacemaker manufacturer to see if compatible for MRI.    #AKI on CKD IIIb Baseline perhaps worsened.  -Monitor BMP -I/Os -Avoid nephrotoxins   HFpEF CAD HTN Normotensive and euvolemic on exam.  -Hold antihypertensives and diuretic for now  Chronic LE wound Improved. Will continue dressing changes while inpatient.  Paroxysmal afib Paced. Unclear if she is on amiodarone and/or eliquis. Will follow up with her pharmacy in the AM.   SSS s/p PPM Nothing to do.  Dispo: Admit patient to Observation with expected length of stay less than 2 midnights.  Signed: Rick Duff, MD 08/06/2022, 10:04 PM  After 5pm on weekdays and 1pm on weekends: On Call pager: [phone]

## 2022-08-07 DIAGNOSIS — R42 Dizziness and giddiness: Secondary | ICD-10-CM | POA: Diagnosis not present

## 2022-08-07 LAB — COMPREHENSIVE METABOLIC PANEL WITH GFR
ALT: 9 U/L (ref 0–44)
AST: 16 U/L (ref 15–41)
Albumin: 2.9 g/dL — ABNORMAL LOW (ref 3.5–5.0)
Alkaline Phosphatase: 73 U/L (ref 38–126)
Anion gap: 6 (ref 5–15)
BUN: 15 mg/dL (ref 8–23)
CO2: 28 mmol/L (ref 22–32)
Calcium: 8.4 mg/dL — ABNORMAL LOW (ref 8.9–10.3)
Chloride: 108 mmol/L (ref 98–111)
Creatinine, Ser: 1.53 mg/dL — ABNORMAL HIGH (ref 0.44–1.00)
GFR, Estimated: 35 mL/min — ABNORMAL LOW
Glucose, Bld: 99 mg/dL (ref 70–99)
Potassium: 3.5 mmol/L (ref 3.5–5.1)
Sodium: 142 mmol/L (ref 135–145)
Total Bilirubin: 0.6 mg/dL (ref 0.3–1.2)
Total Protein: 6.2 g/dL — ABNORMAL LOW (ref 6.5–8.1)

## 2022-08-07 LAB — CBC
HCT: 37.4 % (ref 36.0–46.0)
Hemoglobin: 11.6 g/dL — ABNORMAL LOW (ref 12.0–15.0)
MCH: 32.7 pg (ref 26.0–34.0)
MCHC: 31 g/dL (ref 30.0–36.0)
MCV: 105.4 fL — ABNORMAL HIGH (ref 80.0–100.0)
Platelets: 141 K/uL — ABNORMAL LOW (ref 150–400)
RBC: 3.55 MIL/uL — ABNORMAL LOW (ref 3.87–5.11)
RDW: 17.3 % — ABNORMAL HIGH (ref 11.5–15.5)
WBC: 4.6 K/uL (ref 4.0–10.5)
nRBC: 0 % (ref 0.0–0.2)

## 2022-08-07 NOTE — ED Notes (Signed)
Pt up to [facility] with 3-person assist. Pt had a BM. Pt placed on hospital bed for comfort. Pt resting comfortably at this time.

## 2022-08-07 NOTE — Evaluation (Signed)
Physical Therapy Evaluation Patient Details Name: [name] MRN: [id number] DOB: [birth year] Today's Date: 08/07/2022  History of Present Illness  [name] is a 81 yo F with a PMH of HFpEF (last TTE 07/19/2021 EF 55%), CKD IIIb, CAD, SSS s/p pacemaker, PVD, paroxysmal atrial fibrillation on eliquis, OSA, CVA, GERD, HLD, HTN admitted with vertigo.  Clinical Impression  Patient presents with mobility limited due to dizziness symptoms.  She mobilized up to EOB with spinning elicited upon sitting up and noted some possible L rotary nystagmus.  She remains "lightheaded" seated on EOB and was more so upon standing.  BP was 150's/90's on EOB.  She may have component of positional vertigo, but tinnitis in R ear possibly indicative of other vestibular issues.  Hard to consider how to complete an Eply on her in the standard size bed, but possibly with +2 A we can attempt.  She will continue to benefit from skilled PT in the acute setting.  Hopeful for improvement and with resolution of symptoms that she can return home with HHPT.  Not ready for home yet, however, until she can walk.         Recommendations for follow up therapy are one component of a multi-disciplinary discharge planning process, led by the attending physician.  Recommendations may be updated based on patient status, additional functional criteria and insurance authorization.  Follow Up Recommendations Home health PT      Assistance Recommended at Discharge Intermittent Supervision/Assistance  Patient can return home with the following  A lot of help with walking and/or transfers;Help with stairs or ramp for entrance;A little help with bathing/dressing/bathroom    Equipment Recommendations None recommended by PT  Recommendations for Other Services       Functional Status Assessment Patient has had a recent decline in their functional status and demonstrates the ability to make significant improvements in  function in a reasonable and predictable amount of time.     Precautions / Restrictions Precautions Precautions: Fall Precaution Comments: chronic LE wounds      Mobility  Bed Mobility Overal bed mobility: Needs Assistance Bed Mobility: Supine to Sit, Sit to Supine     Supine to sit: HOB elevated, Min assist Sit to supine: Mod assist   General bed mobility comments: Increased time and reliant on rails assist for L LE and scooting forward, c/o dizziness and noted some rotary nystagmus to L possibly upon sitting up; to supine assist for LE's to flat bed and no dizziness/nystagmus noted    Transfers Overall transfer level: Needs assistance Equipment used: Rolling walker (2 wheels) Transfers: Sit to/from Stand Sit to Stand: From elevated surface, Mod assist           General transfer comment: increased time to rise some assist from bed elevation, but would only go up so far; lifting help to stand and increased time to reach with R hand to RW; c/o dizziness, encouraged to continue for side steps to [facility]    Ambulation/Gait Ambulation/Gait assistance: Min assist, Mod assist Gait Distance (Feet): 2 Feet Assistive device: Rolling walker (2 wheels) Gait Pattern/deviations: Step-to pattern, Trunk flexed, Wide base of support       General Gait Details: 4 side steps to [facility] with RW and min to mod A for balance/safety pt c/o dizziness throughout  Stairs            Wheelchair Mobility    Modified Rankin (Stroke Patients Only)       Balance Overall balance  assessment: Needs assistance   Sitting balance-Leahy Scale: Fair Sitting balance - Comments: limited due to rail hitting her leg initially   Standing balance support: Bilateral upper extremity supported Standing balance-Leahy Scale: Poor Standing balance comment: heavy UE support on RW                             Pertinent Vitals/Pain Pain Assessment Pain Assessment: Faces Faces Pain Scale: Hurts  little more Pain Location: tender on legs Pain Descriptors / Indicators: Tender Pain Intervention(s): Monitored during session    Home Living Family/patient expects to be discharged to:: Private residence Living Arrangements: Other relatives (nephew) Available Help at Discharge: Family;Personal care attendant;Available PRN/intermittently (aide 5x/week 2 hours a day) Type of Home: House Home Access: Stairs to enter Entrance Stairs-Rails: None Entrance Stairs-Number of Steps: 2   Home Layout: One level Home Equipment: Rollator (4 wheels);Hospital bed;Tub bench;Hand held shower head      Prior Function Prior Level of Function : Needs assist             Mobility Comments: walks with rollator ADLs Comments: aide assists with bathing, dressing, meal prep and housekeeping, family helps with meals on the weekend     Hand Dominance   Dominant Hand: Right    Extremity/Trunk Assessment   Upper Extremity Assessment Upper Extremity Assessment: Generalized weakness    Lower Extremity Assessment Lower Extremity Assessment: LLE deficits/detail;RLE deficits/detail RLE Deficits / Details: skin changes consistent with lymphedema, tenderness and dressing on R leg errythema on L; AROM limited due to soft tissue approximation and knee pain, strength 3-/5 hip flexion 3+/5 knee extension RLE Sensation: decreased light touch;history of peripheral neuropathy LLE Deficits / Details: skin changes consistent with lymphedema, tenderness and dressing on R leg errythema on L; AROM limited due to soft tissue approximation and knee pain, strength 3-/5 hip flexion 3+/5 knee extension LLE Sensation: decreased light touch;history of peripheral neuropathy       Communication   Communication: No difficulties  Cognition Arousal/Alertness: Awake/alert Behavior During Therapy: Anxious Overall Cognitive Status: Within Functional Limits for tasks assessed                                           General Comments General comments (skin integrity, edema, etc.): RN reports plans to move to a room soon   Vestibular Assessment - 08/09/22 1058       Symptom Behavior   Type of Dizziness  Spinning;Lightheadedness    Frequency of Dizziness intermittent    Duration of Dizziness seconds to minutes    Symptom Nature Positional;Variable;Constant;Intermittent    Aggravating Factors Supine to sit;Sit to stand    Relieving Factors Rest;Lying supine    Progression of Symptoms No change since onset    History of similar episodes Yes years ago      Oculomotor Exam   Oculomotor Alignment Normal    Ocular ROM WNL    Spontaneous Absent    Gaze-induced  Absent    Smooth Pursuits Intact    Saccades Intact               Exercises     Assessment/Plan    PT Assessment Patient needs continued PT services  PT Problem List Decreased balance;Decreased mobility;Decreased activity tolerance;Decreased safety awareness       PT Treatment Interventions DME instruction;Balance training;Gait training;Stair  training;Patient/family education;Therapeutic activities;Therapeutic exercise;Functional mobility training    PT Goals (Current goals can be found in the Care Plan section)  Acute Rehab PT Goals Patient Stated Goal: to get over the dizziness PT Goal Formulation: With patient Time For Goal Achievement: 08/21/22 Potential to Achieve Goals: Good    Frequency Min 3X/week     Co-evaluation               AM-PAC PT "6 Clicks" Mobility  Outcome Measure Help needed turning from your back to your side while in a flat bed without using bedrails?: A Little Help needed moving from lying on your back to sitting on the side of a flat bed without using bedrails?: A Little Help needed moving to and from a bed to a chair (including a wheelchair)?: A Little Help needed standing up from a chair using your arms (e.g., wheelchair or bedside chair)?: A Lot Help needed to walk in hospital  room?: Total Help needed climbing 3-5 steps with a railing? : Total 6 Click Score: 13    End of Session Equipment Utilized During Treatment: Gait belt Activity Tolerance: Patient limited by fatigue;Other (comment) (limited by anxiety and dizziness) Patient left: in bed   PT Visit Diagnosis: Other abnormalities of gait and mobility (R26.89);Dizziness and giddiness (R42)    Time: 1035-1100 PT Time Calculation (min) (ACUTE ONLY): 25 min   Charges:   PT Evaluation $PT Eval Moderate Complexity: 1 Mod PT Treatments $Neuromuscular Re-education: 8-22 mins        Magda Kiel, PT Acute Rehabilitation Services Office:[phone] 08/07/2022   Reginia Naas 08/07/2022, 1:40 PM

## 2022-08-07 NOTE — ED Notes (Signed)
This tech and [place], NT helped pt change into gown and changed all of her bedsheets. Pure[place] in place.

## 2022-08-07 NOTE — Evaluation (Signed)
Occupational Therapy Evaluation Patient Details Name: [name] MRN: [id number] DOB: 1941/05/03 Today's Date: 08/07/2022   History of Present Illness [name] is a 81 yo F with a PMH of HFpEF (last TTE 07/19/2021 EF 55%), CKD IIIb, CAD, SSS s/p pacemaker, PVD, paroxysmal atrial fibrillation on eliquis, OSA, CVA, GERD, HLD, HTN admitted with vertigo.   Clinical Impression   PTA, pt lived with her nephew, who along with a home health aide provided assistance with ADL and supervision with transfers with RW. Upon eval, pt limited by diplopia, generalized weakness, and dizziness. Pt performing LB ADL with max A and UB ADL with min A to supervision. Providing non-corrective glasses with taping over R eye to decrease experience of double vision and pt reporting no double vision with glasses, however, continued to report dizziness. Recommend discharge home with HHOT to address safety and independence in home setting.      Recommendations for follow up therapy are one component of a multi-disciplinary discharge planning process, led by the attending physician.  Recommendations may be updated based on patient status, additional functional criteria and insurance authorization.   Follow Up Recommendations  Home health OT    Assistance Recommended at Discharge Frequent or constant Supervision/Assistance  Patient can return home with the following A little help with walking and/or transfers;A lot of help with bathing/dressing/bathroom;Assistance with cooking/housework;Assist for transportation;Help with stairs or ramp for entrance    Functional Status Assessment  Patient has had a recent decline in their functional status and demonstrates the ability to make significant improvements in function in a reasonable and predictable amount of time.  Equipment Recommendations  None recommended by OT    Recommendations for Other Services       Precautions / Restrictions  Precautions Precautions: Fall Precaution Comments: chronic LE wounds Restrictions Weight Bearing Restrictions: No      Mobility Bed Mobility Overal bed mobility: Needs Assistance Bed Mobility: Supine to Sit, Sit to Supine     Supine to sit: HOB elevated, Min assist Sit to supine: Mod assist   General bed mobility comments: Increased time and reliant on rails assist for L LE and scooting forward, c/o dizziness and noted some rotary nystagmus to L possibly upon sitting up; to supine assist for LE's to flat bed and no dizziness/nystagmus noted    Transfers Overall transfer level: Needs assistance Equipment used: 1 person hand held assist Transfers: Sit to/from Stand Sit to Stand: Min assist           General transfer comment: increased time to rise and min A for hand held assist      Balance Overall balance assessment: Needs assistance Sitting-balance support: No upper extremity supported, Feet supported Sitting balance-Leahy Scale: Fair     Standing balance support: Bilateral upper extremity supported Standing balance-Leahy Scale: Poor Standing balance comment: UE support with hand held assist                           ADL either performed or assessed with clinical judgement   ADL Overall ADL's : Needs assistance/impaired Eating/Feeding: Sitting;Supervision/ safety   Grooming: Sitting;Supervision/safety Grooming Details (indicate cue type and reason): supervision due to pt report of dizziness Upper Body Bathing: Supervision/ safety;Sitting   Lower Body Bathing: Maximal assistance   Upper Body Dressing : Minimal assistance   Lower Body Dressing: Maximal assistance;Sit to/from stand Lower Body Dressing Details (indicate cue type and reason): receives A at baseline Toilet Transfer:  Minimal assistance;+2 for safety/equipment;BSC/3in1;Stand-pivot Toilet Transfer Details (indicate cue type and reason): Min A; potentially min guard A with RW, but providing  2 person hand held assist as pt with urgent need to have BM Toileting- Clothing Manipulation and Hygiene: Total assistance;Sit to/from stand Toileting - Clothing Manipulation Details (indicate cue type and reason): Pt reports someone usually helps her reach her bottom at home so OT providing A this session     Functional mobility during ADLs: Minimal assistance General ADL Comments: hand held assist provided this session; suspect min guard A with stand pivot transfers with RW>     Vision Baseline Vision/History: 0 No visual deficits Ability to See in Adequate Light: 0 Adequate Patient Visual Report: Diplopia Vision Assessment?: Yes Eye Alignment: Within Functional Limits Ocular Range of Motion: Within Functional Limits Alignment/Gaze Preference: Within Defined Limits Diplopia Assessment: Disappears with one eye closed Additional Comments: Pt reporting double vision. Pt right handed, but appears L eye dominant with making telescope to look at clock and closing R eye when asked to close one eye and tell therapist if double vision goes away thus providing occlusion glasses with transparent tape over R eye. Pt with report of no double vision with glasses on     Perception     Praxis      Pertinent Vitals/Pain Pain Assessment Pain Assessment: Faces Faces Pain Scale: Hurts little more Pain Location: tender on legs Pain Descriptors / Indicators: Tender Pain Intervention(s): Limited activity within patient's tolerance, Monitored during session     Hand Dominance Right   Extremity/Trunk Assessment Upper Extremity Assessment Upper Extremity Assessment: Generalized weakness   Lower Extremity Assessment Lower Extremity Assessment: Defer to PT evaluation   Cervical / Trunk Assessment Cervical / Trunk Assessment: Kyphotic;Other exceptions (large body habitus)   Communication Communication Communication: No difficulties   Cognition Arousal/Alertness: Awake/alert Behavior During  Therapy: WFL for tasks assessed/performed Overall Cognitive Status: Within Functional Limits for tasks assessed                                 General Comments: Pleasant and conversational. Able to describe how she gets to MD appts and levels of assist she receives. Good at self-directing care     General Comments  Pt reports she was seeing OT and PT in home setting PTA    Exercises     Shoulder Instructions      Home Living Family/patient expects to be discharged to:: Private residence Living Arrangements: Other relatives Available Help at Discharge: Family;Personal care attendant;Available PRN/intermittently (aide 5 hours a week, and per pt there when nephew is not there. Also has aid on saturdays who daughter pays out of pocket to come) Type of Home: House Home Access: Stairs to enter [employer] of Steps: 2 Entrance Stairs-Rails: None Home Layout: One level     Bathroom Shower/Tub: Tub/shower unit;Curtain   Bathroom Toilet: Handicapped height     Home Equipment: Rollator (4 wheels);Hospital bed;Tub bench;Hand held shower head   Additional Comments: HHA 5x per week 1.5 hours at a time      Prior Functioning/Environment Prior Level of Function : Needs assist             Mobility Comments: walks with rollator ADLs Comments: aide assists with bathing, dressing, meal prep and housekeeping, family helps with meals on the weekend        OT Problem List: Decreased strength;Decreased activity tolerance;Decreased range of motion;Impaired  balance (sitting and/or standing);Impaired vision/perception;Decreased knowledge of use of DME or AE;Obesity      OT Treatment/Interventions: Self-care/ADL training;Therapeutic exercise;DME and/or AE instruction;Visual/perceptual remediation/compensation;Patient/family education;Balance training;Therapeutic activities    OT Goals(Current goals can be found in the care plan section) Acute Rehab OT  Goals Patient Stated Goal: no dizziness OT Goal Formulation: With patient Time For Goal Achievement: 08/21/22 Potential to Achieve Goals: Good  OT Frequency: Min 2X/week    Co-evaluation              AM-PAC OT "6 Clicks" Daily Activity     Outcome Measure Help from another person eating meals?: None Help from another person taking care of personal grooming?: A Little Help from another person toileting, which includes using toliet, bedpan, or urinal?: A Little Help from another person bathing (including washing, rinsing, drying)?: A Lot Help from another person to put on and taking off regular upper body clothing?: A Little Help from another person to put on and taking off regular lower body clothing?: A Lot 6 Click Score: 17   End of Session Nurse Communication: Other (comment) (Collaborative session with RN as pt wanting to get to [facility].)  Activity Tolerance: Patient tolerated treatment well Patient left: in bed;with call bell/phone within reach;with nursing/sitter in room  OT Visit Diagnosis: Unsteadiness on feet (R26.81);Muscle weakness (generalized) (M62.81);Dizziness and giddiness (R42);Low vision, both eyes (H54.2);Other abnormalities of gait and mobility (R26.89)                Time: 6294-7654 OT Time Calculation (min): 37 min Charges:  OT General Charges $OT Visit: 1 Visit OT Evaluation $OT Eval Moderate Complexity: 1 Mod OT Treatments $Self Care/Home Management : 8-22 mins  Shanda Howells, OTR/L [facility] Acute Rehabilitation Office: [phone]  Lula Olszewski 08/07/2022, 5:42 PM

## 2022-08-07 NOTE — Progress Notes (Addendum)
Subjective:   Patient vomited once since being in the ED.  She states that the room is no longer spinning, but does note she has dizziness when she sits up.  She was able to eat without any emesis.  She does also note that she has some chronic chest pain, stomach pain, and dyspnea but this is not acutely worsening for her.  Discussed with patient that she may need rehab if therapy recommended.  She is hesitant to go to rehab because she is afraid of getting COVID.  She states that she has an aide on weekdays and Saturday.  She also notes that her nephew was able to help her during other times.  She does ambulate with a walker, but would like a 2 wheeled s opposed to a 4 wheeled.  Objective:  Vital signs in last 24 hours: Vitals:   08/07/22 0929 08/07/22 0930 08/07/22 1030 08/07/22 1144  BP:  138/71 136/74 97/85  Pulse:  60 60 (!) 59  Resp:  14 15 18   Temp: 98 F (36.7 C)   98.1 F (36.7 C)  TempSrc: Oral   Oral  SpO2:  97% 93% 96%  Weight:      Height:       Physical Exam  Constitutional: Well-developed, well-nourished, and in no distress.  HENT:  Head: Normocephalic and atraumatic.  Eyes: EOM are normal. No nystagmus.  Neck: Normal range of motion.  Cardiovascular: atrial paced, intact distal pulses. No gallop and no friction rub.  No murmur heard. No lower extremity edema  Pulmonary: Non labored breathing on room air, no wheezing or rales  Abdominal: Soft. Normal bowel sounds. Non distended and non tender Musculoskeletal: Normal range of motion.     Neurological: Alert and oriented to person, place, and time. FTN improved, speed limited by shoulder pain. No nystagmus noted. Unable to understand instructions for HINTS. Skin: Skin is warm and dry. Lipo dermatosclerosis of the ble.    Assessment/Plan:  Principal Problem:   Vertigo  [name] is a 81 yo F with a PMH of HFpEF (last TTE 07/19/2021 EF 55%), CKD IIIb, CAD, SSS s/p pacemaker, PVD, paroxysmal atrial  fibrillation on eliquis, OSA, CVA, GERD, HLD, HTN who presents to the ED for vertigo.    #Vertigo  Patient notes her dizziness is much improved but with positional changes typically precipitate the events. She also notes some associated tinnitus. Her symptoms are most like due to peripheral vertigo secondary to BPPV or possible Menieres disease. CT head without contrast with no evidence of stroke.   -Continue vestibular PT, as needed meclizine for her symptoms -Zofran   #AKI on CKD Will continue to monitor.  -Avoid nephrotoxins  -Monitor BMP -Ios  HFpEF CAD HTN Continue to hold antihypertensives and diuretics. She is hypo to normotensive.    Chronic LE wound Improved. Will continue dressing changes while inpatient.   Paroxysmal afib Paced. Unclear if she is on amiodarone and/or eliquis. Her outpatient pharmacy states that she has not filled a prescription for eliquis or amiodarone in several months about 3 months.  -She is currently paced  SSS s/p PPM  #Mild anemia with macrocytosis Will follow up folate, vitb12 levels  -Replete as needed   #Thrombocytopenia  Unclear etiology, she has no signs of bruising. Her platelets are at 141K -CTM   Prior to Admission Living Arrangement: Anticipated Discharge Location: Barriers to Discharge: Dispo: Anticipated discharge in approximately 3 day(s).   Rick Duff, MD 08/07/2022, 1:51 PM After  5pm on weekdays and 1pm on weekends: On Call pager [phone]

## 2022-08-07 NOTE — Consult Note (Signed)
WOC Nurse Consult Note: Reason for Consult:full thickness chronic, nonhealing wound to RLE, intertriginous dermatitis Wound type:full thickness, moisture/irritant contact dermatitis  ICD-10 CM Codes for Irritant Dermatitis L24A9 - Due to friction or contact with other specified body fluids L30.4  - Erythema intertrigo. Also used for abrasion of the hand, chafing of the skin, dermatitis due to sweating and friction, friction dermatitis, friction eczema, and genital/thigh intertrigo.   Pressure Injury POA: N/A Measurement:Bedside RN to measure RLE wound and document measurements with next dressing change. Wound QHU:TMLY, moist Drainage (amount, consistency, odor) scant from RLE, small from intertriginous areas of dermatitis related to hygiene Periwound: intact Dressing procedure/placement/frequency:I have provided Nursing with guidance for the care of the bilateral LEs and RLE wound using a cleanse followed by moisturizing then the RLE dressed with xeroform gauze and topped with a silicone foam.  The intertriginous dermatitis will be treated with a moisture wicking antimicrobial textile, Phil Dopp Kellie Simmering # [phone]) applied using the following instructions:  Measure and cut length of InterDry to fit in skin folds that have skin breakdown Tuck InterDry fabric into skin folds in a single layer, allow for 2 inches of overhang from skin edges to allow for wicking to occur May remove to bathe; dry area thoroughly and then tuck into affected areas again Do not apply any creams or ointments when using InterDry DO NOT THROW AWAY FOR 5 DAYS unless soiled with stool DO NOT [facility] product, this will inactivate the silver in the material  New sheet of Interdry should be applied after 5 days of use if patient continues to have skin breakdown    [employer] nursing team will not follow, but will remain available to this patient, the nursing and medical teams.  Please re-consult if needed.  Thank you for inviting [place] to  participate in this patient's Plan of Care.  Maudie Flakes, MSN, RN, CNS, [place], Serita Grammes, [employer], [employer] phone:  [phone]

## 2022-08-07 NOTE — TOC Progression Note (Addendum)
Transition of Care [facility]) - Progression Note    Patient Details  Name: [name] MRN: [id number] Date of Birth: Mar 15, 1941  [facility] CM/SW Contact  Carles Collet, RN Phone Number: 08/07/2022, 2:12 PM  Clinical Narrative:     Verified w [place] that patient is active for RN and PT Patient states that her daughter will provide transportation home when [facility]       Expected Discharge Plan and Services                                                 Social Determinants of Health (SDOH) Interventions    Readmission Risk Interventions    05/28/2020   10:04 AM  Readmission Risk Prevention Plan  Transportation Screening Complete  PCP or Specialist Appt within 3-5 Days Complete  HRI or [place] Complete  Social Work Consult for [place] Planning/Counseling Complete  Palliative Care Screening Not Applicable  Medication Review [employer]) Complete

## 2022-08-07 NOTE — ED Notes (Signed)
ED TO INPATIENT HANDOFF REPORT  ED Nurse Name and Phone #: Jeannie Done [place] Name/Age/Gender [name] 81 y.o. female Room/Bed: 014C/014C  Code Status   Code Status: Full Code  Home/SNF/Other Home Patient oriented to: self, place, time, and situation Is this baseline? Yes   Triage Complete: Triage complete  Chief Complaint Vertigo [R42]  Triage Note EMS stated, she is dizzy  with N/V it started last night. She was here for a month with Sepsis Zofran 4mg  sl   Allergies Allergies  Allergen Reactions   Omnipen [Ampicillin] Other (See Comments)    Increases heart rhythm    Penicillins Other (See Comments)    Increases heart rhythm    Linzess [Linaclotide] Other (See Comments)    Severe stomach pains    Level of Care/Admitting Diagnosis ED Disposition     ED Disposition  Admit   Condition  --   Comment  Hospital Area: [place] [[id number]]  Level of Care: Med-Surg [16]  May place patient in observation at [facility] or [place] if equivalent level of care is available:: Yes  Covid Evaluation: Asymptomatic - no recent exposure (last 10 days) testing not required  Diagnosis: Vertigo [207257]  Admitting Physician: Angelica Pou [place]  Attending Physician: Angelica Pou [[id number]]          B Medical/Surgery History Past Medical History:  Diagnosis Date   Arthritis    CHF (congestive heart failure) ([place])    05/2011 echo severe concentric LVH, normal systolic fxn, grade 1 diastolic dysfxn, paradoxical septal motion   Chronic kidney disease-mineral and bone disorder 05/20/2020   Coronary artery disease    Cath 04/2006 normal coronaries, mild-mod slow flow in all coronaries with slow distall runoff, mild-mod LV enlargement, EF 55%    Depression    Dyspnea    Dysrhythmia    GERD (gastroesophageal reflux disease)    Hypercholesterolemia    Hyperlipemia    Hypertension    MI (myocardial infarction) ([place])    NOT  SURE HOW MANY   Morbid obesity ([place])    Nocturia    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   Peripheral vascular disease ([place])    Pneumonia NONE RECENT   Sleep apnea    needs cpap but does not wear   Stroke [facility])    TIA (transient ischemic attack)    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    Vertigo    takes meclizine daily   Past Surgical History:  Procedure Laterality Date   ABDOMINAL HYSTERECTOMY     BIOPSY  06/12/2022   Procedure: BIOPSY;  Surgeon: Thornton Park, MD;  Location: [place];  Service: Gastroenterology;;   CARDIAC CATHETERIZATION     CATARACT EXTRACTION W/PHACO Left 08/29/2013   Procedure: CATARACT EXTRACTION PHACO AND INTRAOCULAR LENS PLACEMENT ([place]);  Surgeon: Adonis Brook, MD;  Location: [place];  Service: Ophthalmology;  Laterality: Left;   COLONOSCOPY WITH PROPOFOL N/A 01/07/2014   Procedure: COLONOSCOPY WITH PROPOFOL;  Surgeon: Juanita Craver, MD;  Location: [facility];  Service: Endoscopy;  Laterality: N/A;   CORONARY ANGIOGRAPHY N/A 05/27/2020   Procedure: CORONARY ANGIOGRAPHY;  Surgeon: Dixie Dials, MD;  Location: [place] CV LAB;  Service: Cardiovascular;  Laterality: N/A;   DILATION AND CURETTAGE OF UTERUS     ESOPHAGOGASTRODUODENOSCOPY (EGD) WITH PROPOFOL N/A 06/12/2022   Procedure: ESOPHAGOGASTRODUODENOSCOPY (EGD) WITH PROPOFOL;  Surgeon: Thornton Park, MD;  Location: [place];  Service: Gastroenterology;  Laterality: N/A;  INSERT / REPLACE / REMOVE PACEMAKER     LEFT HEART CATH AND CORONARY ANGIOGRAPHY N/A 07/18/2020   Procedure: LEFT HEART CATH AND CORONARY ANGIOGRAPHY;  Surgeon: Dixie Dials, MD;  Location: [place] CV LAB;  Service: Cardiovascular;  Laterality: N/A;   PACEMAKER GENERATOR CHANGE N/A 03/27/2014   Procedure: PACEMAKER GENERATOR CHANGE;  Surgeon: Sanda Klein, MD;  Location: [place] CATH LAB;  Service: Cardiovascular;  Laterality: N/A;   PACEMAKER INSERTION     PACEMAKER INSERTION     TUBAL LIGATION      VASCULAR SURGERY     vericose vein surgery     A IV Location/Drains/Wounds Patient Lines/Drains/Airways Status     Active Line/Drains/Airways     Name Placement date Placement time Site Days   Peripheral IV 08/06/22 18 G 1.88" Anterior;Left Forearm 08/06/22  0827  Forearm  1   External Urinary Catheter 06/03/22  0230  --  65   External Urinary Catheter 08/07/22  0009  --  less than 1   Pressure Injury 09/19/20 Coccyx Stage 2 -  Partial thickness loss of dermis presenting as a shallow open injury with a red, pink wound bed without slough. slit measuring 1x1 cm 09/19/20  1803  -- 687   Wound / Incision (Open or Dehisced) 07/16/20 (MASD) Moisture Associated Skin Damage Pelvis Anterior;Right;Left 07/16/20  2056  Pelvis  752   Wound / Incision (Open or Dehisced) 07/16/20 (MASD) Moisture Associated Skin Damage Breast Left;Lower;Right;Bilateral 07/16/20  2056  Breast  752   Wound / Incision (Open or Dehisced) 09/19/20 (MASD) Moisture Associated Skin Damage Knee Left;Posterior 09/19/20  1800  Knee  687   Wound / Incision (Open or Dehisced) (MASD) Moisture Associated Skin Damage Left;Lower --  --  --  --   Wound / Incision (Open or Dehisced) 06/03/22 Incision - Open Pretibial Right 06/03/22  2000  Pretibial  65   Wound / Incision (Open or Dehisced) 06/03/22 Non-pressure wound Pretibial Left 06/03/22  2000  Pretibial  65            Intake/Output Last 24 hours No intake or output data in the 24 hours ending 08/07/22 1033  Labs/Imaging Results for orders placed or performed during the hospital encounter of 08/06/22 (from the past 48 hour(s))  Basic metabolic panel     Status: Abnormal   Collection Time: 08/06/22  8:23 AM  Result Value Ref Range   Sodium 143 135 - 145 mmol/L   Potassium 3.9 3.5 - 5.1 mmol/L   Chloride 104 98 - 111 mmol/L   CO2 30 22 - 32 mmol/L   Glucose, Bld 128 (H) 70 - 99 mg/dL    Comment: Glucose reference range applies only to samples taken after fasting for at least  8 hours.   BUN 18 8 - 23 mg/dL   Creatinine, Ser 1.83 (H) 0.44 - 1.00 mg/dL   Calcium 8.8 (L) 8.9 - 10.3 mg/dL   GFR, Estimated 28 (L) >60 mL/min    Comment: (NOTE) Calculated using the CKD-EPI Creatinine Equation (2021)    Anion gap 9 5 - 15    Comment: Performed at [facility] [address] [place], [place] [address]  Lipase, blood     Status: None   Collection Time: 08/06/22  8:23 AM  Result Value Ref Range   Lipase 23 11 - 51 U/L    Comment: Performed at [facility] [address], [place], [place] [address]  Magnesium  Status: None   Collection Time: 08/06/22  8:23 AM  Result Value Ref Range   Magnesium 2.0 1.7 - 2.4 mg/dL    Comment: Performed at [facility], [place] [address] [place], [place] [address]  CBC with Differential     Status: Abnormal   Collection Time: 08/06/22  8:23 AM  Result Value Ref Range   WBC 4.4 4.0 - 10.5 K/uL   RBC 3.97 3.87 - 5.11 MIL/uL   Hemoglobin 12.9 12.0 - 15.0 g/dL   HCT 41.5 36.0 - 46.0 %   MCV 104.5 (H) 80.0 - 100.0 fL   MCH 32.5 26.0 - 34.0 pg   MCHC 31.1 30.0 - 36.0 g/dL   RDW 17.2 (H) 11.5 - 15.5 %   Platelets 151 150 - 400 K/uL   nRBC 0.5 (H) 0.0 - 0.2 %   Neutrophils Relative % 55 %   Neutro Abs 2.5 1.7 - 7.7 K/uL   Lymphocytes Relative 28 %   Lymphs Abs 1.2 0.7 - 4.0 K/uL   Monocytes Relative 11 %   Monocytes Absolute 0.5 0.1 - 1.0 K/uL   Eosinophils Relative 4 %   Eosinophils Absolute 0.2 0.0 - 0.5 K/uL   Basophils Relative 1 %   Basophils Absolute 0.1 0.0 - 0.1 K/uL   Immature Granulocytes 1 %   Abs Immature Granulocytes 0.02 0.00 - 0.07 K/uL    Comment: Performed at [facility] [address] [place], [place] [address]  I-Stat venous blood gas, [facility] ED only)     Status: Abnormal   Collection Time: 08/06/22  8:31 AM  Result Value Ref Range   pH, Ven 7.357 7.25 - 7.43   pCO2, Ven 57.7 44 - 60 mmHg   pO2, Ven 27 (LL) 32 - 45 mmHg   Bicarbonate 32.4 (H) 20.0 - 28.0 mmol/L   TCO2 34 (H)  22 - 32 mmol/L   O2 Saturation 45 %   Acid-Base Excess 5.0 (H) 0.0 - 2.0 mmol/L   Sodium 145 135 - 145 mmol/L   Potassium 4.0 3.5 - 5.1 mmol/L   Calcium, Ion 1.16 1.15 - 1.40 mmol/L   HCT 41.0 36.0 - 46.0 %   Hemoglobin 13.9 12.0 - 15.0 g/dL   Sample type VENOUS    Comment NOTIFIED PHYSICIAN   I-stat chem 8, ED (not at [facility] or [facility])     Status: Abnormal   Collection Time: 08/06/22  8:50 AM  Result Value Ref Range   Sodium 145 135 - 145 mmol/L   Potassium 3.9 3.5 - 5.1 mmol/L   Chloride 105 98 - 111 mmol/L   BUN 23 8 - 23 mg/dL   Creatinine, Ser 1.80 (H) 0.44 - 1.00 mg/dL   Glucose, Bld 122 (H) 70 - 99 mg/dL    Comment: Glucose reference range applies only to samples taken after fasting for at least 8 hours.   Calcium, Ion 1.14 (L) 1.15 - 1.40 mmol/L   TCO2 33 (H) 22 - 32 mmol/L   Hemoglobin 14.6 12.0 - 15.0 g/dL   HCT 43.0 36.0 - 46.0 %  Comprehensive metabolic panel     Status: Abnormal   Collection Time: 08/07/22  5:49 AM  Result Value Ref Range   Sodium 142 135 - 145 mmol/L   Potassium 3.5 3.5 - 5.1 mmol/L   Chloride 108 98 - 111 mmol/L   CO2 28 22 - 32 mmol/L   Glucose, Bld 99 70 - 99 mg/dL    Comment:  Glucose reference range applies only to samples taken after fasting for at least 8 hours.   BUN 15 8 - 23 mg/dL   Creatinine, Ser 1.53 (H) 0.44 - 1.00 mg/dL   Calcium 8.4 (L) 8.9 - 10.3 mg/dL   Total Protein 6.2 (L) 6.5 - 8.1 g/dL   Albumin 2.9 (L) 3.5 - 5.0 g/dL   AST 16 15 - 41 U/L   ALT 9 0 - 44 U/L   Alkaline Phosphatase 73 38 - 126 U/L   Total Bilirubin 0.6 0.3 - 1.2 mg/dL   GFR, Estimated 35 (L) >60 mL/min    Comment: (NOTE) Calculated using the CKD-EPI Creatinine Equation (2021)    Anion gap 6 5 - 15    Comment: Performed at [facility], [place] [address], [place], [place] [address]  CBC     Status: Abnormal   Collection Time: 08/07/22  5:49 AM  Result Value Ref Range   WBC 4.6 4.0 - 10.5 K/uL   RBC 3.55 (L) 3.87 - 5.11 MIL/uL   Hemoglobin 11.6 (L)  12.0 - 15.0 g/dL   HCT 37.4 36.0 - 46.0 %   MCV 105.4 (H) 80.0 - 100.0 fL   MCH 32.7 26.0 - 34.0 pg   MCHC 31.0 30.0 - 36.0 g/dL   RDW 17.3 (H) 11.5 - 15.5 %   Platelets 141 (L) 150 - 400 K/uL   nRBC 0.0 0.0 - 0.2 %    Comment: Performed at [facility], [place] [address] [place], [place] [address]   CT HEAD WO CONTRAST (5MM)  Result Date: 08/06/2022 CLINICAL DATA:  Dizziness EXAM: CT HEAD WITHOUT CONTRAST TECHNIQUE: Contiguous axial images were obtained from the base of the skull through the vertex without intravenous contrast. RADIATION DOSE REDUCTION: This exam was performed according to the departmental dose-optimization program which includes automated exposure control, adjustment of the mA and/or kV according to patient size and/or use of iterative reconstruction technique. COMPARISON:  08/17/2021 FINDINGS: Brain: No acute intracranial findings are seen. There are no signs of bleeding within the cranium. Cortical sulci are prominent. There is decreased density in periventricular and subcortical white matter. Vascular: Unremarkable. Skull: Unremarkable. Sinuses/Orbits: Unremarkable. Other: None. IMPRESSION: No acute intracranial findings are seen in noncontrast CT brain. Atrophy. Small-vessel disease. Electronically Signed   By: Elmer Picker M.D.   On: 08/06/2022 19:50    Pending Labs Unresulted Labs (From admission, onward)     Start     Ordered   08/07/22 0500  Amiodarone level  Tomorrow morning,   R        08/06/22 2149   08/06/22 0749  Urinalysis, Routine w reflex microscopic  Once,   URGENT        08/06/22 0750            Vitals/Pain Today's Vitals   08/07/22 0900 08/07/22 0929 08/07/22 0930 08/07/22 1030  BP: 124/61  138/71 136/74  Pulse: 60  60 60  Resp: 13  14 15   Temp:  98 F (36.7 C)    TempSrc:  Oral    SpO2: 96%  97% 93%  Weight:      Height:      PainSc:        Isolation Precautions No active isolations  Medications Medications   senna-docusate (Senokot-S) tablet 1 tablet (has no administration in time range)  metoprolol succinate (TOPROL-XL) 24 hr tablet 100 mg (100 mg Oral Given 08/07/22 0930)  mupirocin ointment (BACTROBAN) 2 % 1  Application (1 Application Topical Given 08/07/22 0934)  oxyCODONE-acetaminophen (PERCOCET/ROXICET) 5-325 MG per tablet 1 tablet (has no administration in time range)  pantoprazole (PROTONIX) EC tablet 40 mg (40 mg Oral Given 08/07/22 0930)  rosuvastatin (CRESTOR) tablet 10 mg (10 mg Oral Given 08/07/22 0012)  meclizine (ANTIVERT) tablet 25 mg (has no administration in time range)  losartan (COZAAR) tablet 100 mg (100 mg Oral Given 08/07/22 0932)  liver oil-zinc oxide (DESITIN) 40 % ointment (has no administration in time range)  doxercalciferol (HECTOROL) capsule 0.5 mcg (0.5 mcg Oral Given 08/07/22 0932)  polyethylene glycol (MIRALAX / GLYCOLAX) packet 17 g (17 g Oral Given 08/07/22 0933)  metoCLOPramide (REGLAN) tablet 2.5 mg (2.5 mg Oral Given 08/07/22 0930)  allopurinol (ZYLOPRIM) tablet 300 mg (300 mg Oral Given 08/07/22 0932)  enoxaparin (LOVENOX) injection 75 mg (75 mg Subcutaneous Given 08/07/22 0011)  meclizine (ANTIVERT) tablet 50 mg (50 mg Oral Given 08/06/22 0918)  metoCLOPramide (REGLAN) injection 10 mg (10 mg Intravenous Given 08/06/22 0921)  sodium chloride 0.9 % bolus 500 mL ( Intravenous Stopped 08/06/22 1020)  fentaNYL (SUBLIMAZE) 100 MCG/2ML injection (  Return to [facility] 08/06/22 0943)  diazepam (VALIUM) tablet 2 mg (2 mg Oral Given 08/06/22 1055)    Mobility walks with device High fall risk   Focused Assessments   R Recommendations: See Admitting Provider Note  Report given to:   Additional Notes:

## 2022-08-07 NOTE — Care Management Obs Status (Signed)
[place] NOTIFICATION   Patient Details  Name: [name] MRN: [id number] Date of Birth: December 01, 1940   Medicare Observation Status Notification Given:  Yes    Carles Collet, RN 08/07/2022, 2:25 PM

## 2022-08-07 NOTE — ED Notes (Signed)
Pt appears asleep, resting comfortably at this time. Visible rise and fall of chest noted. Pt appears in NAD. VS WNL, see documented in chart.

## 2022-08-08 LAB — CBC
HCT: 33.2 % — ABNORMAL LOW (ref 36.0–46.0)
Hemoglobin: 10.9 g/dL — ABNORMAL LOW (ref 12.0–15.0)
MCH: 33.5 pg (ref 26.0–34.0)
MCHC: 32.8 g/dL (ref 30.0–36.0)
MCV: 102.2 fL — ABNORMAL HIGH (ref 80.0–100.0)
Platelets: 125 10*3/uL — ABNORMAL LOW (ref 150–400)
RBC: 3.25 MIL/uL — ABNORMAL LOW (ref 3.87–5.11)
RDW: 17.6 % — ABNORMAL HIGH (ref 11.5–15.5)
WBC: 5.2 10*3/uL (ref 4.0–10.5)
nRBC: 0.4 % — ABNORMAL HIGH (ref 0.0–0.2)

## 2022-08-08 LAB — FOLATE: Folate: 11.1 ng/mL (ref >5.9)

## 2022-08-08 LAB — BASIC METABOLIC PANEL
Anion gap: 6 (ref 5–15)
BUN: 14 mg/dL (ref 8–23)
CO2: 27 mmol/L (ref 22–32)
Calcium: 8.4 mg/dL — ABNORMAL LOW (ref 8.9–10.3)
Chloride: 109 mmol/L (ref 98–111)
Creatinine, Ser: 1.59 mg/dL — ABNORMAL HIGH (ref 0.44–1.00)
GFR, Estimated: 33 mL/min — ABNORMAL LOW (ref >60)
Glucose, Bld: 96 mg/dL (ref 70–99)
Potassium: 4 mmol/L (ref 3.5–5.1)
Sodium: 142 mmol/L (ref 135–145)

## 2022-08-08 LAB — VITAMIN B12: Vitamin B-12: 260 pg/mL (ref 180–914)

## 2022-08-08 MED ORDER — VITAMIN B-12 1000 MCG PO TABS
1000.0000 ug | ORAL_TABLET | Freq: Every day | ORAL | Status: DC
  Administered 2022-08-08 – 2022-08-14 (×6): 1000 ug via ORAL
  Filled 2022-08-08 (×7): qty 1

## 2022-08-08 NOTE — Progress Notes (Addendum)
Subjective:   Patient denies any vomiting yesterday or today. Had one bout of vertigo this morning while sitting in bed. Endorses tinnitus in right ear since 1 month ago. She reports meclizine brings about resolution in symptoms. Tolerating food well with normal bowel movements. Endorses good hydration. She denies any pain. She endorses mild shortness of breath but this is present at baseline.   Objective:  Vital signs in last 24 hours: Vitals:   08/08/22 0000 08/08/22 0004 08/08/22 0007 08/08/22 0746  BP: (!) 104/49 (!) 91/42 (!) 104/49 (!) 107/56  Pulse: 60 (!) 59 60 61  Resp: 16 16 16 14   Temp: 99.1 F (37.3 C) 99.1 F (37.3 C) 99.1 F (37.3 C) 98.2 F (36.8 C)  TempSrc: Oral Oral Oral Oral  SpO2: 92% 93% 92% (!) 89%  Weight:      Height:       Physical Exam  Constitutional: Well-developed, well-nourished, and in no distress.   Eyes: EOMI. No nystagmus.  Neck: Normal range of motion.  Cardiovascular: Regular rate and rhythm. No murmurs heard. Distal pulses intact. Extremities warm and well perfused. Trace pitting edema bilaterally at lower extremities. Pulmonary: Non labored breathing on room air, no crackles or wheezes Musculoskeletal: Normal range of motion.     Neurological: Alert and oriented x3. FTN still slow but patient attributes to shoulder pain. No nystagmus appreciated. Without vertigo/dizziness with rightward gaze or turn of head. Skin: Skin is warm and dry. Lipo dermatosclerosis of bilateral lower extremities with trace pitting edema   Assessment/Plan:  Principal Problem:   Vertigo  [name] is a 81 yo F with a PMH of HFpEF (last TTE 07/19/2021 EF 55%), CKD IIIb, CAD, SSS s/p pacemaker, PVD, paroxysmal atrial fibrillation on eliquis, OSA, CVA, GERD, HLD, HTN who presents to the ED for vertigo.   #Vertigo  CT head without contrast with no evidence of stroke. Will discuss with radiology if device compatible with MRI, but this could be  completed outpatient as well. Patient's symptoms have improved. She has not had emesis since leaving the ED.  One episode of dizziness this morning managed well with meclizine. She also notes associated tinnitus at right ear going on 1 month. 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   Unable to reproduce symptoms with rightward gaze as previously the case. Vestibular PT unable to attempt [facility] due to logistics but will attempt next session if able. Intermittent hypotension as result of overmedicated hypertension could also be factor, especially given dizziness upon standing. Will get orthostatics to assess for orthostatic hypotension. Cleared for [facility] by PT/OT.  -Continue vestibular PT -orthostatics -meclizine prn -Zofran   #AKI on CKD Cr 2.05 at discharge from hospitalization in 05/2022. Unsure if renal function ever returned to previous baseline ~1.2. Cr 1.83 this admission, now down to 1.59. Estimated GFR 33, improved from last admission (24). Will continue to monitor, though seems to be improving and may already be at baseline.  -Avoid nephrotoxins  -Monitor BMP -Ios  HFpEF CAD HTN Initially held antihypertensives and diuretics given hypo-normotensive. Restarted losartan 100 mg daily and Toprol XL. MAP of 74 this morning. Mild SOB as per baseline. Lungs CTAB. Sats most recently 93% on RA. Trace lower leg edema. -Continue losartan 100 mg daily -Continue metoprolol succinate 100 mg daily -Hold torsemide given soft pressures.  -Continue to monitor for signs and symptoms of  heart failure  Chronic LE wound Improved. Will continue dressing changes while inpatient.   Paroxysmal afib SSS s/p PPM Followed by [facility] cardiology. Atrially paced, rate 60. Patient is not on amiodarone or Eliquis at home per pharmacy.  -Continue with pacing  #Mild anemia with  macrocytosis Asymptomatic macrocytic anemia. Hgb 10.9 today. Appears may be low at baseline (~11). Folate wnl. B12 low normal, 260. Will supplement B12.  -Cyanocobalamin 1,062mcg daily   -daily CBC  #Thrombocytopenia  Chronic. Unclear etiology, she has no signs of bruising. Her platelets are at 125k. -CTM   Prior to Admission Living Arrangement: Anticipated Discharge Location: Barriers to Discharge: Dispo: Anticipated discharge in approximately 3 day(s).   Linward Natal, MD 08/08/2022, 11:46 AM After 5pm on weekdays and 1pm on weekends: On Call pager [phone]

## 2022-08-09 DIAGNOSIS — N179 Acute kidney failure, unspecified: Secondary | ICD-10-CM | POA: Diagnosis present

## 2022-08-09 DIAGNOSIS — D696 Thrombocytopenia, unspecified: Secondary | ICD-10-CM | POA: Diagnosis present

## 2022-08-09 DIAGNOSIS — E538 Deficiency of other specified B group vitamins: Secondary | ICD-10-CM | POA: Diagnosis present

## 2022-08-09 DIAGNOSIS — I872 Venous insufficiency (chronic) (peripheral): Secondary | ICD-10-CM

## 2022-08-09 DIAGNOSIS — M793 Panniculitis, unspecified: Secondary | ICD-10-CM | POA: Diagnosis present

## 2022-08-09 LAB — BASIC METABOLIC PANEL
Anion gap: 8 (ref 5–15)
BUN: 18 mg/dL (ref 8–23)
CO2: 25 mmol/L (ref 22–32)
Calcium: 8.3 mg/dL — ABNORMAL LOW (ref 8.9–10.3)
Chloride: 106 mmol/L (ref 98–111)
Creatinine, Ser: 1.69 mg/dL — ABNORMAL HIGH (ref 0.44–1.00)
GFR, Estimated: 31 mL/min — ABNORMAL LOW (ref >60)
Glucose, Bld: 111 mg/dL — ABNORMAL HIGH (ref 70–99)
Potassium: 4.1 mmol/L (ref 3.5–5.1)
Sodium: 139 mmol/L (ref 135–145)

## 2022-08-09 LAB — CBC
HCT: 34.5 % — ABNORMAL LOW (ref 36.0–46.0)
Hemoglobin: 11.2 g/dL — ABNORMAL LOW (ref 12.0–15.0)
MCH: 33.2 pg (ref 26.0–34.0)
MCHC: 32.5 g/dL (ref 30.0–36.0)
MCV: 102.4 fL — ABNORMAL HIGH (ref 80.0–100.0)
Platelets: 130 10*3/uL — ABNORMAL LOW (ref 150–400)
RBC: 3.37 MIL/uL — ABNORMAL LOW (ref 3.87–5.11)
RDW: 17.3 % — ABNORMAL HIGH (ref 11.5–15.5)
WBC: 4.6 10*3/uL (ref 4.0–10.5)
nRBC: 0.4 % — ABNORMAL HIGH (ref 0.0–0.2)

## 2022-08-09 MED ORDER — ACETAMINOPHEN 325 MG PO TABS
650.0000 mg | ORAL_TABLET | Freq: Four times a day (QID) | ORAL | Status: DC
  Administered 2022-08-09 – 2022-08-14 (×21): 650 mg via ORAL
  Filled 2022-08-09 (×22): qty 2

## 2022-08-09 MED ORDER — LOSARTAN POTASSIUM 50 MG PO TABS
50.0000 mg | ORAL_TABLET | Freq: Every day | ORAL | Status: DC
  Filled 2022-08-09: qty 1

## 2022-08-09 MED ORDER — APIXABAN 5 MG PO TABS
5.0000 mg | ORAL_TABLET | Freq: Two times a day (BID) | ORAL | Status: DC
  Administered 2022-08-09 – 2022-08-14 (×10): 5 mg via ORAL
  Filled 2022-08-09 (×11): qty 1

## 2022-08-09 MED ORDER — ONDANSETRON HCL 4 MG/2ML IJ SOLN
4.0000 mg | Freq: Four times a day (QID) | INTRAMUSCULAR | Status: DC | PRN
  Administered 2022-08-09 – 2022-08-12 (×3): 4 mg via INTRAVENOUS
  Filled 2022-08-09 (×3): qty 2

## 2022-08-09 NOTE — Progress Notes (Signed)
Physical Therapy Treatment Patient Details Name: [name] MRN: [id number] DOB: Feb 10, 1941 Today's Date: 08/09/2022   History of Present Illness Ms. Lawsyn Heiler is a 81 yo F with a PMH of HFpEF (last TTE 07/19/2021 EF 55%), CKD IIIb, CAD, SSS s/p pacemaker, PVD, paroxysmal atrial fibrillation on eliquis, OSA, CVA, GERD, HLD, HTN admitted with vertigo.    PT Comments    Patient progressing this session able to ambulate some with wider RW in the room, but still limited by weakness and dizziness (lightheadedness).  Did get to test for BPPV in bed, but no symptoms or nystagmus provoked this session.  Meclizine can mask symptoms, but not able to treat without laterality or which canal being clear.  She may also have an acute exacerbation of unilateral vestibular hypofunction since she reports recent stress after the death of her grandaughter.  She will benefit from continued mobilization with assist for safety for vestibular adaptation.  PT will continue to follow and implement adaptation training.  She prefers home with HHPT as she reports aide will assist her in the mornings and nephew home by 11 am.  PT will follow.   Recommendations for follow up therapy are one component of a multi-disciplinary discharge planning process, led by the attending physician.  Recommendations may be updated based on patient status, additional functional criteria and insurance authorization.  Follow Up Recommendations  Home health PT     Assistance Recommended at Discharge Frequent or constant Supervision/Assistance  Patient can return home with the following A little help with walking and/or transfers;A little help with bathing/dressing/bathroom;Assistance with cooking/housework;Help with stairs or ramp for entrance;Assist for transportation   Equipment Recommendations  None recommended by PT    Recommendations for Other Services       Precautions / Restrictions Precautions Precautions:  Fall Precaution Comments: chronic LE wounds     Mobility  Bed Mobility Overal bed mobility: Needs Assistance Bed Mobility: Supine to Sit, Sit to Supine     Supine to sit: HOB elevated, Min assist Sit to supine: Mod assist   General bed mobility comments: assist for legs into bed, rolled in bed some for BPPV assessment, did not have nystagmus today with Hallpike, supine head roll nor today with supine to sit, but general weakness and light headedness    Transfers Overall transfer level: Needs assistance Equipment used: Rolling walker (2 wheels) Transfers: Sit to/from Stand Sit to Stand: Mod assist           General transfer comment: heavy UE support on armrests, some lifting help to stand    Ambulation/Gait Ambulation/Gait assistance: Min assist Gait Distance (Feet): 8 Feet Assistive device: Rolling walker (2 wheels) Gait Pattern/deviations: Step-to pattern, Step-through pattern, Decreased stride length, Trunk flexed       General Gait Details: limited by feeling weak so stepping back to bed after walking forward from recliner, assist for balance and safety   Stairs             Wheelchair Mobility    Modified Rankin (Stroke Patients Only)       Balance Overall balance assessment: Needs assistance Sitting-balance support: Feet supported Sitting balance-Leahy Scale: Fair     Standing balance support: Bilateral upper extremity supported Standing balance-Leahy Scale: Poor Standing balance comment: UE support on RW                            Cognition Arousal/Alertness: Awake/alert Behavior During Therapy: Flat affect  Overall Cognitive Status: Within Functional Limits for tasks assessed                                 General Comments: reports not feeling well today; states she hasn't been able to eat and feels light headed        Exercises      General Comments General comments (skin integrity, edema, etc.): BP Stable  with supine SBP 130's and sitting 150's; performed hallpike dix with bed in trendelenberg and pt without symptoms or nystagmus with repeated R and L head turns; then in supine head roll R and L no nystagmus and symptoms to rule out horizontal BPPV.      Pertinent Vitals/Pain Pain Assessment Faces Pain Scale: Hurts little more Pain Location: tender on legs Pain Descriptors / Indicators: Tender Pain Intervention(s): Monitored during session    Home Living                          Prior Function            PT Goals (current goals can now be found in the care plan section) Progress towards PT goals: Progressing toward goals    Frequency    Min 3X/week      PT Plan Current plan remains appropriate    Co-evaluation              AM-PAC PT "6 Clicks" Mobility   Outcome Measure  Help needed turning from your back to your side while in a flat bed without using bedrails?: A Little Help needed moving from lying on your back to sitting on the side of a flat bed without using bedrails?: A Little Help needed moving to and from a bed to a chair (including a wheelchair)?: A Little Help needed standing up from a chair using your arms (e.g., wheelchair or bedside chair)?: A Little Help needed to walk in hospital room?: Total Help needed climbing 3-5 steps with a railing? : Total 6 Click Score: 14    End of Session Equipment Utilized During Treatment: Gait belt Activity Tolerance: Patient limited by fatigue;Other (comment) (dizziness) Patient left: in bed   PT Visit Diagnosis: Other abnormalities of gait and mobility (R26.89);Dizziness and giddiness (R42)     Time: 3833-3832 PT Time Calculation (min) (ACUTE ONLY): 29 min  Charges:  $Gait Training: 8-22 mins $Neuromuscular Re-education: 8-22 mins                     Magda Kiel, PT Acute Rehabilitation Services Office:[phone] 08/09/2022    Reginia Naas 08/09/2022, 10:54 AM

## 2022-08-09 NOTE — Progress Notes (Signed)
Occupational Therapy Treatment Patient Details Name: [name] MRN: [id number] DOB: 01-09-1941 Today's Date: 08/09/2022   History of present illness [name] is a 81 yo F with a PMH of HFpEF (last TTE 07/19/2021 EF 55%), CKD IIIb, CAD, SSS s/p pacemaker, PVD, paroxysmal atrial fibrillation on eliquis, OSA, CVA, GERD, HLD, HTN admitted with vertigo.   OT comments  Patient continues to make steady progress towards goals in skilled OT session. Patient's session encompassed  functional transfers to [facility] and to recliner. Patient tearful in session with regard to dizziness and double vision, now endorsing that occluded glasses no longer work. Patient requiring mod A to transfer off of BSC (lower surface) and able to ambulate a few steps to recliner. Patient declining further mobility due to fatigue. Discharge remains appropriate, OT will continue to follow.    Recommendations for follow up therapy are one component of a multi-disciplinary discharge planning process, led by the attending physician.  Recommendations may be updated based on patient status, additional functional criteria and insurance authorization.    Follow Up Recommendations  Home health OT    Assistance Recommended at Discharge Frequent or constant Supervision/Assistance  Patient can return home with the following  A little help with walking and/or transfers;A lot of help with bathing/dressing/bathroom;Assistance with cooking/housework;Assist for transportation;Help with stairs or ramp for entrance   Equipment Recommendations  None recommended by OT    Recommendations for Other Services      Precautions / Restrictions Precautions Precautions: Fall Precaution Comments: chronic LE wounds Restrictions Weight Bearing Restrictions: No       Mobility Bed Mobility Overal bed mobility: Needs Assistance Bed Mobility: Supine to Sit       Sit to supine: Min guard   General bed mobility comments:  min gaurd, patient keeping eyes closed the majority of the time as it helps her vision, good use of "moving body as as unit" in order to not agitate nystagmus    Transfers Overall transfer level: Needs assistance Equipment used: 1 person hand held assist Transfers: Sit to/from Stand Sit to Stand: Mod assist           General transfer comment: mod A to come into standing off of BSC, (patient completing stand pivot at min gaurd level) able to ambulate a few steps backwards with one person HHA     Balance Overall balance assessment: Needs assistance Sitting-balance support: Feet supported Sitting balance-Leahy Scale: Good     Standing balance support: Bilateral upper extremity supported Standing balance-Leahy Scale: Poor Standing balance comment: reliant on UE support                           ADL either performed or assessed with clinical judgement   ADL Overall ADL's : Needs assistance/impaired                         Toilet Transfer: Minimal assistance;BSC/3in1;Stand-pivot   Toileting- Clothing Manipulation and Hygiene: Total assistance;Sit to/from stand Toileting - Clothing Manipulation Details (indicate cue type and reason): Patient gets assist with peri-care at home     Functional mobility during ADLs: Minimal assistance;Cueing for sequencing;Cueing for safety General ADL Comments: hand held assist provided this session; suspect min guard A with stand pivot transfers with RW    Extremity/Trunk Assessment              Vision       Perception  Praxis      Cognition Arousal/Alertness: Awake/alert Behavior During Therapy: WFL for tasks assessed/performed Overall Cognitive Status: Within Functional Limits for tasks assessed                                 General Comments: reports not feeling well today; states she hasn't been able to eat and feels light headed, tearful because she wants to get better         Exercises      Shoulder Instructions       General Comments BP Stable with supine SBP 130's and sitting 150's; performed hallpike dix with bed in trendelenberg and pt without symptoms or nystagmus with repeated R and L head turns; then in supine head roll R and L no nystagmus and symptoms to rule out horizontal BPPV.    Pertinent Vitals/ Pain       Pain Assessment Pain Assessment: Faces Faces Pain Scale: Hurts little more Pain Location: tender on legs Pain Descriptors / Indicators: Tender Pain Intervention(s): Limited activity within patient's tolerance, Monitored during session, Repositioned  Home Living                                          Prior Functioning/Environment              Frequency  Min 2X/week        Progress Toward Goals  OT Goals(current goals can now be found in the care plan section)  Progress towards OT goals: Progressing toward goals  Acute Rehab OT Goals Patient Stated Goal: to get better OT Goal Formulation: With patient Time For Goal Achievement: 08/21/22 Potential to Achieve Goals: Good  Plan Discharge plan remains appropriate    Co-evaluation                 AM-PAC OT "6 Clicks" Daily Activity     Outcome Measure   Help from another person eating meals?: None Help from another person taking care of personal grooming?: A Little Help from another person toileting, which includes using toliet, bedpan, or urinal?: A Little Help from another person bathing (including washing, rinsing, drying)?: A Lot Help from another person to put on and taking off regular upper body clothing?: A Little Help from another person to put on and taking off regular lower body clothing?: A Lot 6 Click Score: 17    End of Session    OT Visit Diagnosis: Unsteadiness on feet (R26.81);Muscle weakness (generalized) (M62.81);Dizziness and giddiness (R42);Low vision, both eyes (H54.2);Other abnormalities of gait and mobility  (R26.89)   Activity Tolerance Patient tolerated treatment well   Patient Left in bed;with call bell/phone within reach;with chair alarm set   Nurse Communication Mobility status        Time: 5456-2563 OT Time Calculation (min): 17 min  Charges: OT General Charges $OT Visit: 1 Visit OT Treatments $Self Care/Home Management : 8-22 mins  Corinne Ports E. Camera Krienke, OTR/L Acute Rehabilitation Services [place] 08/09/2022, 2:39 PM

## 2022-08-09 NOTE — Progress Notes (Signed)
Subjective:   Patient states that her symptoms have improved but she continues to have some dizziness that does not resolve. She also states that turning her head in certain directions does make her dizziness worse. She has had significant nausea but has not had any emesis overnight.   She denies any pain. She endorses mild shortness of breath but this is present at baseline.   Objective:  Vital signs in last 24 hours: Vitals:   08/08/22 2329 08/09/22 0349 08/09/22 0852 08/09/22 1241  BP:  (!) 125/55 (!) 146/51 (!) 142/63  Pulse:  60 60 60  Resp:  16 17 17   Temp:  98.5 F (36.9 C) 98.4 F (36.9 C) 98.6 F (37 C)  TempSrc:  Oral Oral Oral  SpO2: 93% 90% 94% 92%  Weight:      Height:       Physical Exam  Constitutional: Well-developed, well-nourished, and in no distress.   Eyes: EOMI. No nystagmus.  Neck: Normal range of motion.  Cardiovascular: Regular rate and rhythm. No murmurs heard. Distal pulses intact. Extremities warm and well perfused. Trace pitting edema bilaterally at lower extremities. Pulmonary: Non labored breathing on room air, no crackles or wheezes Musculoskeletal: Normal range of motion.     Neurological: Alert and oriented x3. FTN still slow but patient attributes to shoulder pain. No nystagmus appreciated. Without vertigo/dizziness with rightward gaze or turn of head. Skin: Skin is warm and dry. Lipo dermatosclerosis of bilateral lower extremities with trace pitting edema   Assessment/Plan:  Principal Problem:   Vertigo  [name] is a 81 yo F with a PMH of HFpEF (last TTE 07/19/2021 EF 55%), CKD IIIb, CAD, SSS s/p pacemaker, PVD, paroxysmal atrial fibrillation on eliquis, OSA, CVA, GERD, HLD, HTN who presents to the ED for vertigo.   #Vertigo  CT head without contrast with no evidence of stroke. Will discuss with radiology if device compatible with MRI, but this could be completed outpatient as well. Patient's symptoms have improved. She  has not had emesis since leaving the ED.  Continues to have dizziness that is managed well with meclizine as needed.  She also notes associated tinnitus at right ear going on 1 month. 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   Unable to reproduce symptoms with rightward gaze as previously the case. Vestibular PT unable to attempt Eply due to logistics but will attempt next session if able. Intermittent hypotension as result of overmedication  could also be factor. However, patient's symptoms seem to persist irrespective of position. Cleared for [facility] by PT/OT.  -Continue vestibular PT -F/u orthostatics -MRI team to let us know if her pacemaker is compatible for MRI.  -meclizine prn -Zofran   #AKI on CKD Cr 2.05 at discharge from hospitalization in 05/2022. Unsure if renal function ever returned to previous baseline ~1.2. Cr 1.83 this admission, now down to 1.59. Up this AM but overall stable.  -Avoid nephrotoxins  -Monitor BMP   HFpEF CAD HTN Initially held antihypertensives and diuretics given hypo-normotensive. Restarted losartan 100 mg daily and Toprol XL. MAP of 74 yesterday. Mild SOB as per baseline. Breathing well on room air. Trace lower leg edema. -Continue losartan 100 mg daily -Continue metoprolol succinate 100 mg daily -Hold torsemide given soft pressures.  -Continue to monitor for signs and symptoms of heart failure  Chronic LE wound Improved. Will continue dressing changes  while inpatient.   Paroxysmal afib SSS s/p PPM Followed by [facility] cardiology. Atrially paced, rate 60. Patient is on amiodarone and Eliquis per her cardiologist, Dr. Doylene Canard.  -Continue with pacing  #Mild anemia with macrocytosis Asymptomatic macrocytic anemia. Hgb stable. Appears may be low at baseline (~11). Folate wnl. B12 low normal, 260. Will supplement B12.   -Cyanocobalamin 1,018mcg daily   -daily CBC  #Thrombocytopenia  Chronic. Unclear etiology, she has no signs of bruising. Her platelets are at 130k this AM.  -CTM   Prior to Admission Living Arrangement: Anticipated Discharge Location: Barriers to Discharge: Dispo: Anticipated discharge in approximately 3 day(s).   Rick Duff, MD 08/09/2022, 3:46 PM After 5pm on weekdays and 1pm on weekends: On Call pager [phone]

## 2022-08-09 NOTE — Progress Notes (Signed)
OT Cancellation Note  Patient Details Name: [name] MRN: [id number] DOB: [birth year]   Cancelled Treatment:    Reason Eval/Treat Not Completed: Other (comment) Patient just returning to bed, OT will follow back this afternoon to complete treatment.   Corinne Ports E. Melissa Tomaselli, OTR/L Acute Rehabilitation Services [place] 08/09/2022, 11:27 AM

## 2022-08-10 DIAGNOSIS — N179 Acute kidney failure, unspecified: Secondary | ICD-10-CM | POA: Diagnosis present

## 2022-08-10 DIAGNOSIS — I959 Hypotension, unspecified: Secondary | ICD-10-CM | POA: Diagnosis present

## 2022-08-10 DIAGNOSIS — H8110 Benign paroxysmal vertigo, unspecified ear: Secondary | ICD-10-CM | POA: Diagnosis present

## 2022-08-10 DIAGNOSIS — E7800 Pure hypercholesterolemia, unspecified: Secondary | ICD-10-CM | POA: Diagnosis present

## 2022-08-10 DIAGNOSIS — I495 Sick sinus syndrome: Secondary | ICD-10-CM | POA: Diagnosis present

## 2022-08-10 DIAGNOSIS — D696 Thrombocytopenia, unspecified: Secondary | ICD-10-CM | POA: Diagnosis present

## 2022-08-10 DIAGNOSIS — N1832 Chronic kidney disease, stage 3b: Secondary | ICD-10-CM | POA: Diagnosis present

## 2022-08-10 DIAGNOSIS — K3184 Gastroparesis: Secondary | ICD-10-CM | POA: Diagnosis present

## 2022-08-10 DIAGNOSIS — I878 Other specified disorders of veins: Secondary | ICD-10-CM | POA: Diagnosis present

## 2022-08-10 DIAGNOSIS — I739 Peripheral vascular disease, unspecified: Secondary | ICD-10-CM | POA: Diagnosis present

## 2022-08-10 DIAGNOSIS — H9311 Tinnitus, right ear: Secondary | ICD-10-CM | POA: Diagnosis present

## 2022-08-10 DIAGNOSIS — Z6841 Body Mass Index (BMI) 40.0 and over, adult: Secondary | ICD-10-CM | POA: Diagnosis not present

## 2022-08-10 DIAGNOSIS — D539 Nutritional anemia, unspecified: Secondary | ICD-10-CM | POA: Diagnosis present

## 2022-08-10 DIAGNOSIS — I130 Hypertensive heart and chronic kidney disease with heart failure and stage 1 through stage 4 chronic kidney disease, or unspecified chronic kidney disease: Secondary | ICD-10-CM | POA: Diagnosis present

## 2022-08-10 DIAGNOSIS — R42 Dizziness and giddiness: Secondary | ICD-10-CM | POA: Diagnosis present

## 2022-08-10 DIAGNOSIS — H8121 Vestibular neuronitis, right ear: Secondary | ICD-10-CM | POA: Diagnosis not present

## 2022-08-10 DIAGNOSIS — L304 Erythema intertrigo: Secondary | ICD-10-CM | POA: Diagnosis present

## 2022-08-10 DIAGNOSIS — H8111 Benign paroxysmal vertigo, right ear: Secondary | ICD-10-CM | POA: Diagnosis present

## 2022-08-10 DIAGNOSIS — M170 Bilateral primary osteoarthritis of knee: Secondary | ICD-10-CM | POA: Diagnosis present

## 2022-08-10 DIAGNOSIS — M793 Panniculitis, unspecified: Secondary | ICD-10-CM | POA: Diagnosis present

## 2022-08-10 DIAGNOSIS — I480 Paroxysmal atrial fibrillation: Secondary | ICD-10-CM | POA: Diagnosis present

## 2022-08-10 DIAGNOSIS — I2510 Atherosclerotic heart disease of native coronary artery without angina pectoris: Secondary | ICD-10-CM | POA: Diagnosis present

## 2022-08-10 DIAGNOSIS — G8929 Other chronic pain: Secondary | ICD-10-CM | POA: Diagnosis present

## 2022-08-10 DIAGNOSIS — I5032 Chronic diastolic (congestive) heart failure: Secondary | ICD-10-CM | POA: Diagnosis present

## 2022-08-10 DIAGNOSIS — I872 Venous insufficiency (chronic) (peripheral): Secondary | ICD-10-CM | POA: Diagnosis present

## 2022-08-10 LAB — BASIC METABOLIC PANEL
Anion gap: 6 (ref 5–15)
BUN: 15 mg/dL (ref 8–23)
CO2: 26 mmol/L (ref 22–32)
Calcium: 8.5 mg/dL — ABNORMAL LOW (ref 8.9–10.3)
Chloride: 109 mmol/L (ref 98–111)
Creatinine, Ser: 1.45 mg/dL — ABNORMAL HIGH (ref 0.44–1.00)
GFR, Estimated: 37 mL/min — ABNORMAL LOW (ref >60)
Glucose, Bld: 104 mg/dL — ABNORMAL HIGH (ref 70–99)
Potassium: 4.2 mmol/L (ref 3.5–5.1)
Sodium: 141 mmol/L (ref 135–145)

## 2022-08-10 LAB — CBC
HCT: 35.5 % — ABNORMAL LOW (ref 36.0–46.0)
Hemoglobin: 11.2 g/dL — ABNORMAL LOW (ref 12.0–15.0)
MCH: 32.7 pg (ref 26.0–34.0)
MCHC: 31.5 g/dL (ref 30.0–36.0)
MCV: 103.8 fL — ABNORMAL HIGH (ref 80.0–100.0)
Platelets: 132 10*3/uL — ABNORMAL LOW (ref 150–400)
RBC: 3.42 MIL/uL — ABNORMAL LOW (ref 3.87–5.11)
RDW: 17.3 % — ABNORMAL HIGH (ref 11.5–15.5)
WBC: 3.7 10*3/uL — ABNORMAL LOW (ref 4.0–10.5)
nRBC: 0.5 % — ABNORMAL HIGH (ref 0.0–0.2)

## 2022-08-10 MED ORDER — LOSARTAN POTASSIUM 25 MG PO TABS: 25.0000 mg | ORAL_TABLET | Freq: Every day | ORAL | Status: DC

## 2022-08-10 MED ORDER — AMIODARONE HCL 200 MG PO TABS
200.0000 mg | ORAL_TABLET | Freq: Every day | ORAL | Status: DC
  Administered 2022-08-11 – 2022-08-14 (×4): 200 mg via ORAL
  Filled 2022-08-10 (×4): qty 1

## 2022-08-10 MED ORDER — ORAL CARE MOUTH RINSE: 15.0000 mL | OROMUCOSAL | Status: DC | PRN

## 2022-08-10 MED ORDER — METOPROLOL SUCCINATE ER 25 MG PO TB24
25.0000 mg | ORAL_TABLET | Freq: Every day | ORAL | Status: DC
  Administered 2022-08-11 – 2022-08-14 (×4): 25 mg via ORAL
  Filled 2022-08-10 (×4): qty 1

## 2022-08-10 MED ORDER — METOCLOPRAMIDE HCL 10 MG PO TABS
5.0000 mg | ORAL_TABLET | Freq: Three times a day (TID) | ORAL | Status: DC
  Administered 2022-08-11 – 2022-08-14 (×12): 5 mg via ORAL
  Filled 2022-08-10 (×12): qty 1

## 2022-08-10 MED ORDER — LACTATED RINGERS IV BOLUS
500.0000 mL | Freq: Once | INTRAVENOUS | Status: AC
  Administered 2022-08-10: 500 mL via INTRAVENOUS

## 2022-08-10 NOTE — Progress Notes (Signed)
Physical Therapy Treatment Patient Details Name: [name] MRN: [id number] DOB: 1941/02/20 Today's Date: 08/10/2022   History of Present Illness [name] is a 81 yo F with a PMH of HFpEF (last TTE 07/19/2021 EF 55%), CKD IIIb, CAD, SSS s/p pacemaker, PVD, paroxysmal atrial fibrillation on eliquis, OSA, CVA, GERD, HLD, HTN admitted with vertigo.    PT Comments    Upon PT arrival pt with report of having thrown up earlier this morning and lightheadedness being worse than yesterday. Pt denies room spinning but worsening of lightheadedness. HOB transitioned from 30 deg to flat. Pt reports lightheadedness to be worse but no room spinning. Lightheadedness improves when HOB elevated however is still present and is difficulty maintain eyes open as she feels better when they're closed. Pt also reports constant R ear ringing that she's had for a long time. Attempted VORx1 exercises however pt unable to maintain eyes open to complete. With head turns L and R and dix hallpike using bed into trendelenburg no nystagmus noted or room spinning just worsening of lightheadedness. BPs were taken see below.  HOB at 30 deg: 105/43 HOB flat: 105/48 HOB at 45 deg 105/50  BP low in general compared to baseline being [id number] systolic. RN notified. Limited to bed level session due to pt with inability to maintain eyes open, overwhelming sensation of lightheadedness and not feeling well. Acute PT to cont to follow.    Recommendations for follow up therapy are one component of a multi-disciplinary discharge planning process, led by the attending physician.  Recommendations may be updated based on patient status, additional functional criteria and insurance authorization.  Follow Up Recommendations  Home health PT     Assistance Recommended at Discharge Frequent or constant Supervision/Assistance  Patient can return home with the following A little help with walking and/or transfers;A little  help with bathing/dressing/bathroom;Assistance with cooking/housework;Help with stairs or ramp for entrance;Assist for transportation   Equipment Recommendations  None recommended by PT    Recommendations for Other Services       Precautions / Restrictions Precautions Precautions: Fall Precaution Comments: chronic LE wounds Restrictions Weight Bearing Restrictions: No     Mobility  Bed Mobility               General bed mobility comments: limited to bed level due to light headedness    Transfers                        Ambulation/Gait                   Stairs             Wheelchair Mobility    Modified Rankin (Stroke Patients Only)       Balance                                            Cognition Arousal/Alertness: Awake/alert Behavior During Therapy: WFL for tasks assessed/performed Overall Cognitive Status: Within Functional Limits for tasks assessed                                 General Comments: reports not feeling well today; states she hasn't been able to eat and feels light headed, +emesis today        Exercises  Other Exercises Other Exercises: attempted VORx1 however pt unable to maintain eyes open due to lightheadedness    General Comments General comments (skin integrity, edema, etc.): BP 105/43- RN notified      Pertinent Vitals/Pain Pain Assessment Pain Assessment: Faces Faces Pain Scale: No hurt    Home Living                          Prior Function            PT Goals (current goals can now be found in the care plan section) Acute Rehab PT Goals Patient Stated Goal: to get over the dizziness PT Goal Formulation: With patient Time For Goal Achievement: 08/21/22 Potential to Achieve Goals: Good Progress towards PT goals: Not progressing toward goals - comment (limited by lightheadedness)    Frequency    Min 3X/week      PT Plan Current plan  remains appropriate    Co-evaluation              AM-PAC PT "6 Clicks" Mobility   Outcome Measure  Help needed turning from your back to your side while in a flat bed without using bedrails?: A Little Help needed moving from lying on your back to sitting on the side of a flat bed without using bedrails?: A Little Help needed moving to and from a bed to a chair (including a wheelchair)?: A Little Help needed standing up from a chair using your arms (e.g., wheelchair or bedside chair)?: A Little Help needed to walk in hospital room?: Total Help needed climbing 3-5 steps with a railing? : Total 6 Click Score: 14    End of Session Equipment Utilized During Treatment: Gait belt Activity Tolerance: Other (comment) (lightheadedness) Patient left: in bed Nurse Communication: Other (comment) (BP at 105/43) PT Visit Diagnosis: Other abnormalities of gait and mobility (R26.89);Dizziness and giddiness (R42)     Time: 1660-6004 PT Time Calculation (min) (ACUTE ONLY): 29 min  Charges:  $Therapeutic Exercise: 8-22 mins $Therapeutic Activity: 8-22 mins                     Kittie Plater, PT, DPT Acute Rehabilitation Services Secure chat preferred Office #: [phone]    Berline Lopes 08/10/2022, 1:26 PM

## 2022-08-10 NOTE — Progress Notes (Cosign Needed)
Subjective:   Patient states that her symptoms have improved but she continues to have some dizziness and lightheadedness. She notes that this AM she sat up quickly and she became dizzy. She had an episode of emesis at that time. When she ate breakfast she again had ~3 episodes of emesis. She does feel like the dizziness she presented wit has improved significantly. She also notes that her meclizine seems to help with this. The lightheadedness she notes this AM she feels is possibly related to her blood pressure medications.  She denies any pain. She endorses mild shortness of breath but this is present at baseline.   Objective:  Vital signs in last 24 hours: Vitals:   08/10/22 1146 08/10/22 1205 08/10/22 1307 08/10/22 1628  BP: (!) 101/48 (!) 124/53 (!) 146/64 (!) 132/55  Pulse:  60 (!) 59 (!) 59  Resp:  18  20  Temp:  97.8 F (36.6 C)  98.3 F (36.8 C)  TempSrc:  Oral  Oral  SpO2:  96%  95%  Weight:      Height:       Physical Exam  Constitutional: Well-developed, well-nourished, and in no distress.   Cardiovascular: Regular rate and rhythm. No murmurs heard. Distal pulses intact. Extremities warm and well perfused. Trace pitting edema bilaterally at lower extremities. Pulmonary: Non labored breathing on room air, no crackles or wheezes Musculoskeletal: Normal range of motion.     Neurological: Alert and oriented x3. FTN with slow movement of arm, but otherwise no difficulty, movements are smooth patient states has shoulder pain. No nystagmus appreciated. Without vertigo/dizziness with rightward gaze or turn of head. Skin: Skin is warm and dry. Lipo dermatosclerosis of bilateral lower extremities with trace pitting edema   Assessment/Plan:  Principal Problem:   Vertigo Active Problems:   Acute kidney injury superimposed on stage 3a chronic kidney disease (HCC)   Chronic venous stasis dermatitis   Lipodermatosclerosis of both lower extremities   Thrombocytopenia (HCC)    B12 deficiency  [name] is a 81 yo F with a PMH of HFpEF (last TTE 07/19/2021 EF 55%), CKD IIIb, CAD, SSS s/p pacemaker, PVD, paroxysmal atrial fibrillation on eliquis, OSA, CVA, GERD, HLD, HTN who presents to the ED for vertigo.   #Vertigo  Unclear exact etiology of patients symptoms. However, it is possible that she has multiple problems contributing to her presentation. She could have BPPV, Menieres, v vestibular neuritis. Although PT notes that she had a negative dix hallpike. Fortunately patient does note that this improves with meclizine. Patient could also have lightheadedness from her blood pressure medications.  -Continue patients antihypertensives at reduced dose.  -Continue vestibular PT -Scheduled meclizine to 5mg  TID.  -Zofran PRN  Her ppm is not compatible with MRI.    #AKI on CKD Cr 2.05 at discharge from hospitalization in 05/2022. Unsure if renal function ever returned to previous baseline ~1.2. Cr. Improved this AM.  -Avoid nephrotoxins  -Monitor BMP   HFpEF CAD HTN Initially held antihypertensives and diuretics given hypo-normotensive. Restarted losartan 100 mg daily and Toprol XL. Patient's blood pressures became soft again this AM. Will half her current home regimen and continue to hold her diuretic.  -Continue losartan at 50 mg daily -Continue metoprolol succinate 50 mg daily -Hold torsemide given soft pressures.  -Continue to monitor for signs and symptoms of heart failure  Chronic LE wound Improved. Will continue dressing changes while inpatient.   Paroxysmal afib SSS s/p PPM Followed by [facility] cardiology. Atrially  paced, rate 60. Patient is on amiodarone and Eliquis per her cardiologist, Dr. Doylene Canard.  -Continue with pacing -Continue eliquis and amiodarone   #Mild anemia with macrocytosis Asymptomatic macrocytic anemia. Hgb stable. Appears may be low at baseline (~11). Folate wnl. B12 low normal, 260. Will supplement B12.   -Cyanocobalamin 1,029mcg daily   -daily CBC  #Thrombocytopenia  Chronic. Unclear etiology, she has no signs of bruising.[name] Kitchen  -CTM   Prior to Admission Living Arrangement: Anticipated Discharge Location: Barriers to Discharge: Dispo: Anticipated discharge in approximately 3 day(s).   Rick Duff, MD 08/10/2022, 6:39 PM After 5pm on weekdays and 1pm on weekends: On Call pager [phone]

## 2022-08-10 NOTE — Plan of Care (Signed)

## 2022-08-10 NOTE — Progress Notes (Signed)
Pt wound dressing changed.

## 2022-08-10 NOTE — TOC Progression Note (Signed)
Transition of Care [facility]) - Progression Note    Patient Details  Name: [name] MRN: [id number] Date of Birth: October 02, 1941  Transition of Care [facility]) CM/SW Contact  Pollie Friar, RN Phone Number: 08/10/2022, 12:24 PM  Clinical Narrative:    Pt with some vomitting today. Awaiting medical readiness.  Pt active with [facility] for [place]. Resumption orders entered.  Family will transport home when medically ready.    Expected Discharge Plan: [place] Barriers to Discharge: Continued Medical Work up  Expected Discharge Plan and Services Expected Discharge Plan: [place]   Discharge Planning Services: CM Consult   Living arrangements for the past 2 months: Single Family Home                           HH Arranged: PT, OT [place] Agency: [facility]         Social Determinants of Health (SDOH) Interventions    Readmission Risk Interventions    05/28/2020   10:04 AM  Readmission Risk Prevention Plan  Transportation Screening Complete  PCP or Specialist Appt within 3-5 Days Complete  HRI or [place] Complete  Social Work Consult for [place] Planning/Counseling Complete  Palliative Care Screening Not Applicable  Medication Review [employer]) Complete

## 2022-08-10 NOTE — Progress Notes (Signed)
Pt c/o pea-sized "lump" in the LLQ of her abdomen with radiating tenderness. She also c/o burning on urination and would like to be checked for UTI

## 2022-08-11 LAB — BASIC METABOLIC PANEL
Anion gap: 7 (ref 5–15)
BUN: 13 mg/dL (ref 8–23)
CO2: 27 mmol/L (ref 22–32)
Calcium: 8.9 mg/dL (ref 8.9–10.3)
Chloride: 108 mmol/L (ref 98–111)
Creatinine, Ser: 1.31 mg/dL — ABNORMAL HIGH (ref 0.44–1.00)
GFR, Estimated: 42 mL/min — ABNORMAL LOW (ref >60)
Glucose, Bld: 86 mg/dL (ref 70–99)
Potassium: 4.1 mmol/L (ref 3.5–5.1)
Sodium: 142 mmol/L (ref 135–145)

## 2022-08-11 LAB — CBC
HCT: 35.8 % — ABNORMAL LOW (ref 36.0–46.0)
Hemoglobin: 11.4 g/dL — ABNORMAL LOW (ref 12.0–15.0)
MCH: 33 pg (ref 26.0–34.0)
MCHC: 31.8 g/dL (ref 30.0–36.0)
MCV: 103.8 fL — ABNORMAL HIGH (ref 80.0–100.0)
Platelets: 138 10*3/uL — ABNORMAL LOW (ref 150–400)
RBC: 3.45 MIL/uL — ABNORMAL LOW (ref 3.87–5.11)
RDW: 17.5 % — ABNORMAL HIGH (ref 11.5–15.5)
WBC: 4 10*3/uL (ref 4.0–10.5)
nRBC: 0 % (ref 0.0–0.2)

## 2022-08-11 NOTE — Progress Notes (Signed)
Physical Therapy Treatment Patient Details Name: [name] MRN: [id number] DOB: 16-Feb-1941 Today's Date: 08/11/2022   History of Present Illness [name] is a 81 yo F with a PMH of HFpEF (last TTE 07/19/2021 EF 55%), CKD IIIb, CAD, SSS s/p pacemaker, PVD, paroxysmal atrial fibrillation on eliquis, OSA, CVA, GERD, HLD, HTN admitted with vertigo.    PT Comments    Pt received up in chair with eyes closed stating "I feel better with my eyes closed. I'm just so lightheaded." Pt denies room spinning, c/o lightheadedness. When transitioning trunk forward to prep for standing pt with worsening lightheadedness and inability to hold head up and reports of onset of frontal headache which comes and goes. When eyes opened pt squinting and R eye slightly deviated to the R. R eye covered with guaze to see if blurred vision improved however pt denies it to beneficial. Pt also with c/o of sound of gritty sand paper inside her R ear with amplified hearing on the R. Unsure of etiology as pt unable to have MRI due to placemaker however unsure if vertigo is strictly peripheral vs central as pt c/o constant lightheadedness with stable BP and denies room spinning. Pt with difficulty maintaining eyes open as well. At this time pt unsafe to d/c home as she hasn't been able to amb due to lightheadedness. Acute PT to cont to follow.    Recommendations for follow up therapy are one component of a multi-disciplinary discharge planning process, led by the attending physician.  Recommendations may be updated based on patient status, additional functional criteria and insurance authorization.  Follow Up Recommendations  Home health PT     Assistance Recommended at Discharge Frequent or constant Supervision/Assistance  Patient can return home with the following A lot of help with walking and/or transfers;A lot of help with bathing/dressing/bathroom;Assistance with cooking/housework;Assistance with  feeding;Assist for transportation;Help with stairs or ramp for entrance   Equipment Recommendations  None recommended by PT    Recommendations for Other Services       Precautions / Restrictions Precautions Precautions: Fall Precaution Comments: chronic LE wounds Restrictions Weight Bearing Restrictions: No     Mobility  Bed Mobility               General bed mobility comments: pt up in chair upon PT arrival    Transfers Overall transfer level: Needs assistance Equipment used: Rolling walker (2 wheels), 2 person hand held assist Transfers: Sit to/from Stand Sit to Stand: Mod assist, +2 physical assistance           General transfer comment: modAx2 to power up, increased time, onset of "dizziness" in which she describes as lightheadedness not room spinning which limited standing tolerance, unable to achieve full upright posture    Ambulation/Gait               General Gait Details: pt unable to progress due to lightheadedness. Per RN pt able to transfer to [facility] and then to recliner however pt now unable to tolerate standing >7 seconds prior to needing to sit   Stairs             Wheelchair Mobility    Modified Rankin (Stroke Patients Only)       Balance Overall balance assessment: Needs assistance Sitting-balance support: Feet supported Sitting balance-Leahy Scale: Good     Standing balance support: Bilateral upper extremity supported Standing balance-Leahy Scale: Poor Standing balance comment: reliant on UE support  Cognition Arousal/Alertness: Awake/alert Behavior During Therapy: WFL for tasks assessed/performed Overall Cognitive Status: Within Functional Limits for tasks assessed                                 General Comments: pt oriented, follows commands, and gives great effort however pt with inconsistent reports of symptoms between PT and RN and has a hard time describing  symptoms        Exercises      General Comments General comments (skin integrity, edema, etc.): BP in chair 144/61, moving forward in chair with report of worsening lightheadedness 145/72, s/p standing 163/69      Pertinent Vitals/Pain Pain Assessment Pain Assessment: Faces Faces Pain Scale: Hurts whole lot Pain Location: frontal headache Pain Descriptors / Indicators: Headache Pain Intervention(s): Limited activity within patient's tolerance    Home Living                          Prior Function            PT Goals (current goals can now be found in the care plan section) Acute Rehab PT Goals Patient Stated Goal: to get better Progress towards PT goals: Not progressing toward goals - comment (limited by lightheadedness)    Frequency    Min 3X/week      PT Plan Current plan remains appropriate    Co-evaluation              AM-PAC PT "6 Clicks" Mobility   Outcome Measure  Help needed turning from your back to your side while in a flat bed without using bedrails?: A Lot Help needed moving from lying on your back to sitting on the side of a flat bed without using bedrails?: A Lot Help needed moving to and from a bed to a chair (including a wheelchair)?: A Lot Help needed standing up from a chair using your arms (e.g., wheelchair or bedside chair)?: A Lot Help needed to walk in hospital room?: A Lot Help needed climbing 3-5 steps with a railing? : Total 6 Click Score: 11    End of Session Equipment Utilized During Treatment: Gait belt Activity Tolerance: Other (comment) (limited by lightheadedness) Patient left: in chair;with chair alarm set;with call bell/phone within reach   PT Visit Diagnosis: Other abnormalities of gait and mobility (R26.89);Dizziness and giddiness (R42)     Time: 6553-7482 PT Time Calculation (min) (ACUTE ONLY): 38 min  Charges:  $Therapeutic Exercise: 8-22 mins $Therapeutic Activity: 23-37 mins                      Kittie Plater, PT, DPT Acute Rehabilitation Services Secure chat preferred Office #: [phone]    Berline Lopes 08/11/2022, 11:58 AM

## 2022-08-11 NOTE — Progress Notes (Signed)
Subjective:   Patient states that her symptoms are improved. She still has some dizziness but this gets better with medicines and she feels that this is almost completely resolved. She continues to note some lightheadedness however. This occurs when she goes from the lying to seated position and to the standing position.   Objective:  Vital signs in last 24 hours: Vitals:   08/11/22 0344 08/11/22 0836 08/11/22 1203 08/11/22 1545  BP: (!) 129/59 122/63 (!) 155/64 130/81  Pulse: 60 60 60 60  Resp: 17 18 16 16   Temp: 97.6 F (36.4 C) 98.5 F (36.9 C) 98 F (36.7 C) 98 F (36.7 C)  TempSrc: Axillary Oral Oral Oral  SpO2: 95% 95% 95% 99%  Weight:      Height:       Physical Exam  Constitutional: Well-developed, well-nourished, and in no distress.   Cardiovascular: Regular rate and rhythm. No murmurs heard. Distal pulses intact. Extremities warm and well perfused. Trace pitting edema bilaterally at lower extremities. Pulmonary: Non labored breathing on room air, no crackles or wheezes Musculoskeletal: Normal range of motion.     Neurological: Alert and oriented x3.  Skin: Skin is warm and dry. Lipo dermatosclerosis of bilateral lower extremities with trace pitting edema   Assessment/Plan:  Principal Problem:   Vertigo Active Problems:   Acute kidney injury superimposed on stage 3a chronic kidney disease (HCC)   Chronic venous stasis dermatitis   Lipodermatosclerosis of both lower extremities   Thrombocytopenia (HCC)   B12 deficiency  [name] is a 81 yo F with a PMH of HFpEF (last TTE 07/19/2021 EF 55%), CKD IIIb, CAD, SSS s/p pacemaker, PVD, paroxysmal atrial fibrillation on eliquis, OSA, CVA, GERD, HLD, HTN who presents to the ED for vertigo.   #Vertigo  Unclear exact etiology of patients symptoms. Continue to improve. However, it is possible that she has multiple problems contributing to her presentation. She could have BPPV, Menieres, v vestibular  neuritis. Although PT notes that she had a negative dix hallpike. Fortunately patient does note that this improves with meclizine. Patient could also have lightheadedness from her blood pressure medications.  -Continue patients antihypertensives at reduced dose.  -Continue vestibular PT -Scheduled meclizine to 5mg  TID.  -Zofran PRN  Her ppm is not compatible with MRI.    #AKI on CKD Cr 2.05 at discharge from hospitalization in 05/2022. Unsure if renal function ever returned to previous baseline ~1.2. Cr. Continues to improve.   -Avoid nephrotoxins  -Monitor BMP   HFpEF CAD HTN Initially held antihypertensives and diuretics given hypo-normotensive. Restarted losartan 100 mg daily and Toprol XL. Patient's blood pressures became soft again yesterday. D/c'd her losartan. And decreased her metoprolol to 25mg  daily.  -Start metoprolol succinate 25 mg daily -Hold torsemide, and losartan  -Continue to monitor for signs and symptoms of heart failure  Chronic LE venous stasis wound Improved. Will continue dressing changes while inpatient.   Paroxysmal afib SSS s/p PPM Followed by [facility] cardiology. Atrially paced, rate 60. Patient is on amiodarone and Eliquis per her cardiologist, Dr. Doylene Canard.  -Continue with pacing -Continue eliquis and amiodarone   #Mild anemia with macrocytosis Asymptomatic macrocytic anemia. Hgb stable. Appears may be low at baseline (~11). Folate wnl. B12 low normal, 260. Will supplement B12.  -Continue B12 1,017mcg daily   -daily CBC  #Thrombocytopenia  Chronic. Improved this AM. Unclear etiology, she has no signs of bruising.[name] Kitchen  -CTM   Prior to Admission Living Arrangement: Anticipated Discharge Location: Barriers  to Discharge: Dispo: Anticipated discharge in approximately 3 day(s).   Rick Duff, MD 08/11/2022, 3:46 PM After 5pm on weekdays and 1pm on weekends: On Call pager [phone]

## 2022-08-11 NOTE — Progress Notes (Signed)
Occupational Therapy Treatment Patient Details Name: [name] MRN: [id number] DOB: 1940-11-28 Today's Date: 08/11/2022   History of present illness Ms. Breelynn Bankert is a 81 yo F with a PMH of HFpEF (last TTE 07/19/2021 EF 55%), CKD IIIb, CAD, SSS s/p pacemaker, PVD, paroxysmal atrial fibrillation on eliquis, OSA, CVA, GERD, HLD, HTN admitted with vertigo.   OT comments  Pt on BSC on arrival and requesting A with clean-up. Nurse tech arriving and providing posterior pericare with OT providing min guard A for standing balance; pt supporting self of RW. Pt performing STS 3x during session with min guard A with heavy reliance on RW. Pt interested in AE for LB dressing; min A for initial education. Continue to recommend HHOT to optimize safety and independence in ADL and IADL.    Recommendations for follow up therapy are one component of a multi-disciplinary discharge planning process, led by the attending physician.  Recommendations may be updated based on patient status, additional functional criteria and insurance authorization.    Follow Up Recommendations  Home health OT    Assistance Recommended at Discharge Frequent or constant Supervision/Assistance  Patient can return home with the following  A little help with walking and/or transfers;A lot of help with bathing/dressing/bathroom;Assistance with cooking/housework;Assist for transportation;Help with stairs or ramp for entrance   Equipment Recommendations  None recommended by OT    Recommendations for Other Services      Precautions / Restrictions Precautions Precautions: Fall Precaution Comments: chronic LE wounds Restrictions Weight Bearing Restrictions: No       Mobility Bed Mobility               General bed mobility comments: up in chair on arrival    Transfers Overall transfer level: Needs assistance Equipment used: Rolling walker (2 wheels) Transfers: Sit to/from Stand Sit to Stand: Min  guard           General transfer comment: Min guard A for safety     Balance Overall balance assessment: Needs assistance Sitting-balance support: Feet supported Sitting balance-Leahy Scale: Good Sitting balance - Comments: using AE for LB dressing   Standing balance support: Bilateral upper extremity supported Standing balance-Leahy Scale: Poor Standing balance comment: reliant on UE support                           ADL either performed or assessed with clinical judgement   ADL Overall ADL's : Needs assistance/impaired                     Lower Body Dressing: Minimal assistance;Sitting/lateral leans Lower Body Dressing Details (indicate cue type and reason): providing Ed on LB dressing with AE and pt requiring min A for initial learning Toilet Transfer: BSC/3in1;Stand-pivot;Rolling walker (2 wheels);Min guard Toilet Transfer Details (indicate cue type and reason): Min guard for simple step pivot transfers Toileting- Clothing Manipulation and Hygiene: Total assistance;Sit to/from stand Toileting - Clothing Manipulation Details (indicate cue type and reason): nurse tech providing posterior pericare     Functional mobility during ADLs: Min guard;Rolling walker (2 wheels) General ADL Comments: decreased activity tolerance    Extremity/Trunk Assessment Upper Extremity Assessment Upper Extremity Assessment: Generalized weakness   Lower Extremity Assessment Lower Extremity Assessment: Defer to PT evaluation        Vision   Additional Comments: not reporting double vision today   Perception     Praxis      Cognition Arousal/Alertness: Awake/alert Behavior  During Therapy: WFL for tasks assessed/performed Overall Cognitive Status: Within Functional Limits for tasks assessed                                 General Comments: pt oriented, follows commands, and gives great effort        Exercises      Shoulder Instructions        General Comments VSS. Nurse tech taking vitals with OT in room    Pertinent Vitals/ Pain       Pain Assessment Pain Assessment: Faces Faces Pain Scale: Hurts little more Pain Location: frontal headache Pain Descriptors / Indicators: Headache Pain Intervention(s): Limited activity within patient's tolerance, Monitored during session  Home Living                                          Prior Functioning/Environment              Frequency  Min 2X/week        Progress Toward Goals  OT Goals(current goals can now be found in the care plan section)  Progress towards OT goals: Progressing toward goals  Acute Rehab OT Goals Patient Stated Goal: none stated OT Goal Formulation: With patient Time For Goal Achievement: 08/21/22 Potential to Achieve Goals: Good ADL Goals Pt Will Perform Upper Body Dressing: with modified independence;sitting Pt Will Perform Lower Body Dressing: with supervision;sit to/from stand;with adaptive equipment Pt Will Transfer to Toilet: bedside commode;stand pivot transfer;with modified independence Additional ADL Goal #1: Pt will perform visual tracking exercises with no report of double vision and no observed nystagmus in preparation for participation in IADL and leisure.  Plan Discharge plan remains appropriate    Co-evaluation                 AM-PAC OT "6 Clicks" Daily Activity     Outcome Measure   Help from another person eating meals?: None Help from another person taking care of personal grooming?: A Little Help from another person toileting, which includes using toliet, bedpan, or urinal?: A Little Help from another person bathing (including washing, rinsing, drying)?: A Lot Help from another person to put on and taking off regular upper body clothing?: A Little Help from another person to put on and taking off regular lower body clothing?: A Lot 6 Click Score: 17    End of Session Equipment Utilized During  Treatment: Rolling walker (2 wheels)  OT Visit Diagnosis: Unsteadiness on feet (R26.81);Muscle weakness (generalized) (M62.81);Dizziness and giddiness (R42);Low vision, both eyes (H54.2);Other abnormalities of gait and mobility (R26.89)   Activity Tolerance Patient tolerated treatment well   Patient Left in chair;with call bell/phone within reach   Nurse Communication Mobility status        Time: 9924-2683 OT Time Calculation (min): 20 min  Charges: OT General Charges $OT Visit: 1 Visit OT Treatments $Self Care/Home Management : 8-22 mins  Shanda Howells, OTR/L [facility] Acute Rehabilitation Office: [phone]   Lula Olszewski 08/11/2022, 1:35 PM

## 2022-08-12 LAB — CBC
HCT: 35.4 % — ABNORMAL LOW (ref 36.0–46.0)
Hemoglobin: 11.2 g/dL — ABNORMAL LOW (ref 12.0–15.0)
MCH: 32.9 pg (ref 26.0–34.0)
MCHC: 31.6 g/dL (ref 30.0–36.0)
MCV: 104.1 fL — ABNORMAL HIGH (ref 80.0–100.0)
Platelets: 148 10*3/uL — ABNORMAL LOW (ref 150–400)
RBC: 3.4 MIL/uL — ABNORMAL LOW (ref 3.87–5.11)
RDW: 17.4 % — ABNORMAL HIGH (ref 11.5–15.5)
WBC: 3.4 10*3/uL — ABNORMAL LOW (ref 4.0–10.5)
nRBC: 0 % (ref 0.0–0.2)

## 2022-08-12 LAB — IRON AND TIBC
Iron: 50 ug/dL (ref 28–170)
Saturation Ratios: 17 % (ref 10.4–31.8)
TIBC: 304 ug/dL (ref 250–450)
UIBC: 254 ug/dL

## 2022-08-12 LAB — BASIC METABOLIC PANEL
Anion gap: 7 (ref 5–15)
BUN: 14 mg/dL (ref 8–23)
CO2: 26 mmol/L (ref 22–32)
Calcium: 8.3 mg/dL — ABNORMAL LOW (ref 8.9–10.3)
Chloride: 107 mmol/L (ref 98–111)
Creatinine, Ser: 1.41 mg/dL — ABNORMAL HIGH (ref 0.44–1.00)
GFR, Estimated: 38 mL/min — ABNORMAL LOW (ref >60)
Glucose, Bld: 101 mg/dL — ABNORMAL HIGH (ref 70–99)
Potassium: 4.1 mmol/L (ref 3.5–5.1)
Sodium: 140 mmol/L (ref 135–145)

## 2022-08-12 LAB — AMIODARONE LEVEL
Amiodarone Lvl: 454 ng/mL — ABNORMAL LOW (ref 1000–2500)
N-Desethyl-Amiodarone: 454 ng/mL

## 2022-08-12 LAB — FERRITIN: Ferritin: 52 ng/mL (ref 11–307)

## 2022-08-12 MED ORDER — LACTATED RINGERS IV BOLUS
500.0000 mL | Freq: Once | INTRAVENOUS | Status: AC
  Administered 2022-08-12: 500 mL via INTRAVENOUS

## 2022-08-12 MED ORDER — SORBITOL 70 % SOLN: 400.0000 mL | TOPICAL_OIL | Freq: Once | ORAL | Status: DC | PRN

## 2022-08-12 MED ORDER — BISACODYL 10 MG RE SUPP: 10.0000 mg | Freq: Every day | RECTAL | Status: DC | PRN

## 2022-08-12 MED ORDER — SENNOSIDES-DOCUSATE SODIUM 8.6-50 MG PO TABS
1.0000 | ORAL_TABLET | Freq: Every day | ORAL | Status: DC
  Administered 2022-08-14: 1 via ORAL
  Filled 2022-08-12 (×3): qty 1

## 2022-08-12 NOTE — Progress Notes (Signed)
Physical Therapy Treatment Patient Details Name: [name] MRN: [id number] DOB: [birth year] Today's Date: 08/12/2022   History of Present Illness [name] is a 81 yo F with a PMH of HFpEF (last TTE 07/19/2021 EF 55%), CKD IIIb, CAD, SSS s/p pacemaker, PVD, paroxysmal atrial fibrillation on eliquis, OSA, CVA, GERD, HLD, HTN admitted with vertigo.    PT Comments     Pt admitted with above diagnosis. Pt was able to incr distance with ambulation and was min guard assist.Pt did however need to sit after 5 feet of ambulation on 2 attempts and vomited.  Pt states the dizziness does continue to improve but is not totally gone. She states she didn't get meds this am so called nurse for vertigo and nausea meds.  Pt encouraged to perform x1 exercises and educated in compensatory strategies using target "A" during mobility.  Pt currently with functional limitations due to balance and endurance deficits. Pt will benefit from skilled PT to increase their independence and safety with mobility to allow discharge to the venue listed below.     Recommendations for follow up therapy are one component of a multi-disciplinary discharge planning process, led by the attending physician.  Recommendations may be updated based on patient status, additional functional criteria and insurance authorization.  Follow Up Recommendations  Home health PT     Assistance Recommended at Discharge Frequent or constant Supervision/Assistance  Patient can return home with the following A lot of help with walking and/or transfers;A lot of help with bathing/dressing/bathroom;Assistance with cooking/housework;Assistance with feeding;Assist for transportation;Help with stairs or ramp for entrance   Equipment Recommendations  None recommended by PT    Recommendations for Other Services       Precautions / Restrictions Precautions Precautions: Fall Precaution Comments: chronic LE  wounds Restrictions Weight Bearing Restrictions: No     Mobility  Bed Mobility               General bed mobility comments: up in chair on arrival    Transfers Overall transfer level: Needs assistance Equipment used: Rolling walker (2 wheels) Transfers: Sit to/from Stand Sit to Stand: Min guard           General transfer comment: Min guard A for safety    Ambulation/Gait Ambulation/Gait assistance: Min guard Gait Distance (Feet): 10 Feet (5 feet x 2) Assistive device: Rolling walker (2 wheels) Gait Pattern/deviations: Step-to pattern, Step-through pattern, Decreased stride length, Trunk flexed, Wide base of support   Gait velocity interpretation: <1.31 ft/sec, indicative of household ambulator   General Gait Details: Pt able to take a few steps wtih good safety with RW. Asked to sit down each time to vomit.  Pt did vomit after each walking attempt. Pt was encouraged to focus on target "A" that was placed on IV pole and pt was able to do so.  Called nurse to bring pts vertigo and nausea meds.   Stairs             Wheelchair Mobility    Modified Rankin (Stroke Patients Only)       Balance           Standing balance support: Bilateral upper extremity supported Standing balance-Leahy Scale: Poor Standing balance comment: reliant on UE support                            Cognition Arousal/Alertness: Awake/alert Behavior During Therapy: WFL for tasks assessed/performed Overall Cognitive Status: Within  Functional Limits for tasks assessed                                 General Comments: pt oriented, follows commands, and gives great effort        Exercises Other Exercises Other Exercises: Reviewed x1 exercises and discussed compensatory strategies.    General Comments General comments (skin integrity, edema, etc.): VSS      Pertinent Vitals/Pain Pain Assessment Pain Assessment: No/denies pain    Home Living                           Prior Function            PT Goals (current goals can now be found in the care plan section) Acute Rehab PT Goals Patient Stated Goal: to get better Progress towards PT goals: Progressing toward goals    Frequency    Min 3X/week      PT Plan Current plan remains appropriate    Co-evaluation              AM-PAC PT "6 Clicks" Mobility   Outcome Measure  Help needed turning from your back to your side while in a flat bed without using bedrails?: A Lot Help needed moving from lying on your back to sitting on the side of a flat bed without using bedrails?: A Lot Help needed moving to and from a bed to a chair (including a wheelchair)?: A Little Help needed standing up from a chair using your arms (e.g., wheelchair or bedside chair)?: A Little Help needed to walk in hospital room?: A Little Help needed climbing 3-5 steps with a railing? : A Lot 6 Click Score: 15    End of Session Equipment Utilized During Treatment: Gait belt Activity Tolerance: Patient limited by fatigue (vomiting) Patient left: in chair;with chair alarm set;with call bell/phone within reach Nurse Communication: Other (comment) (vertigo and nausea meds) PT Visit Diagnosis: Other abnormalities of gait and mobility (R26.89);Dizziness and giddiness (R42)     Time: 1103-1594 PT Time Calculation (min) (ACUTE ONLY): 28 min  Charges:  $Gait Training: 23-37 mins                     Angelus Hoopes M,PT Acute Rehab Services [place] 08/12/2022, 1:32 PM

## 2022-08-12 NOTE — Progress Notes (Signed)
Subjective:  Patient endorses episode of emesis after breakfast this morning.  She endorses nausea and left-sided abdominal pain.  She takes MiraLAX at home, took senna in the past but has not been taking it recently. Continues with intermittent dizziness, lightheadedness but no vertigo.  Endorses 1 hard stool in the past couple days.  Objective:  Vital signs in last 24 hours: Vitals:   08/11/22 2302 08/12/22 0339 08/12/22 1209 08/12/22 1450  BP: (!) 116/48 (!) 145/69 (!) 142/77 133/80  Pulse: (!) 59 (!) 59 (!) 59 61  Resp: 16 16 18 18   Temp: 97.8 F (36.6 C) 98.6 F (37 C) 98.5 F (36.9 C) 98.2 F (36.8 C)  TempSrc: Oral   Oral  SpO2: 95% 96% 94% 95%  Weight:      Height:       Physical Exam  Constitutional: Well-developed, well-nourished, and in no acute distress.   Pulmonary: Normal work of breathing on room air Abdomen: Soft, nondistended.  Tenderness to palpation at left abdomen.  No rebound.  No guarding. Musculoskeletal: Normal range of motion.     Neurological: Alert and oriented x3.  Ext: trace pitting edema at lower extremities Skin: Skin is warm and dry.     Latest Ref Rng & Units 08/12/2022    7:33 AM 08/11/2022    8:17 AM 08/10/2022    3:59 AM  CBC  WBC 4.0 - 10.5 K/uL 3.4  4.0  3.7   Hemoglobin 12.0 - 15.0 g/dL 11.2  11.4  11.2   Hematocrit 36.0 - 46.0 % 35.4  35.8  35.5   Platelets 150 - 400 K/uL 148  138  132       Latest Ref Rng & Units 08/12/2022    7:33 AM 08/11/2022    8:17 AM 08/10/2022    3:59 AM  CMP  Glucose 70 - 99 mg/dL 101  86  104   BUN 8 - 23 mg/dL 14  13  15    Creatinine 0.44 - 1.00 mg/dL 1.41  1.31  1.45   Sodium 135 - 145 mmol/L 140  142  141   Potassium 3.5 - 5.1 mmol/L 4.1  4.1  4.2   Chloride 98 - 111 mmol/L 107  108  109   CO2 22 - 32 mmol/L 26  27  26    Calcium 8.9 - 10.3 mg/dL 8.3  8.9  8.5       Assessment/Plan:  Principal Problem:   Vertigo Active Problems:   Acute kidney injury superimposed on stage 3a  chronic kidney disease (HCC)   Chronic venous stasis dermatitis   Lipodermatosclerosis of both lower extremities   Thrombocytopenia (HCC)   B12 deficiency  [name] is a 81 yo F with a PMH of HFpEF (last TTE 07/19/2021 EF 55%), CKD IIIb, CAD, SSS s/p pacemaker, PVD, paroxysmal atrial fibrillation on eliquis, OSA, CVA, GERD, HLD, HTN who presented to the ED with vertiginous symptoms with unclear etiology at this time.   #Vertigo  Continue without definitive diagnosis though feel most likely peripheral cause.  Symptoms continue to improve though. However, it is possible that she has multiple problems contributing to her presentation. She could have BPPV, Menieres, v vestibular neuritis. Although PT notes that she had a negative dix hallpike. Fortunately patient continues to endorse improvement with meclizine as needed. Her ppm is not compatible with MRI. Patient could also have lightheadedness from her blood pressure medications.  Will consider outpatient referral to ENT given presence of tinnitus  on right side which corresponds with her symptoms. -Continue vestibular PT -meclizine to 25 mg TID prn -Zofran PRN  #Lightheadedness Patient possibly overmedicated with antihypertensives.  Currently holding losartan and providing metoprolol at reduced dose.  Holding torsemide.  Have been unable to perform orthostatics at this point but will do so when able. Blood pressures increased slightly, will reassess symptoms / orthostatics tomorrow. -Continue patient's antihypertensives at reduced dose.  -Perform orthostatic vitals  #Constipation vs diverticulitis Patient with nausea after eating, emesis, left sided abdominal pain.  She reports 1 hard stool this morning.  Feel symptoms most likely due to constipation.  Patient does have a history of multiple left-sided colonic diverticula, but less concerned for diverticulitis at this point.  Afebrile, without leukocytosis, without peritoneal  signs. -Smog enema -Senokot daily -MiraLAX daily -Dulcolax suppository daily prn -Reglan 5 mg TID prn -protonix 40 mg daily  #AKI on CKD Cr 2.05 at discharge from hospitalization in 05/2022. Unsure if renal function ever returned to previous baseline ~1.2. Cr continues to improve.   -Avoid nephrotoxins  -Monitor BMP  HFpEF CAD HTN Initially held antihypertensives and diuretics given hypo-normotensive. Restarted losartan 100 mg daily and Toprol XL. Patient's blood pressures became soft again. D/c'd her losartan and decreased her metoprolol to 25mg  daily.  Blood pressure now normotensive to mildly hypertensive. -Continue metoprolol succinate 25 mg daily -Hold torsemide, and losartan  -Continue to monitor for signs and symptoms of heart failure  Chronic LE venous stasis wound Improved. Will continue dressing changes while inpatient.   Paroxysmal afib SSS s/p PPM Followed by [facility] cardiology. Atrially paced, rate 60. Patient is on amiodarone and Eliquis per her cardiologist, Dr. Doylene Canard.  -Continue with pacing -Continue eliquis and amiodarone   #Mild anemia with macrocytosis Asymptomatic macrocytic anemia. Hgb stable. Appears may be low at baseline (~11). Folate wnl. B12 low normal, 260.   -Continue B12 1,000 mcg daily   -daily CBC  #Thrombocytopenia  Chronic.  Stable on recheck today. Unclear etiology, she has no signs of bruising.[name] Kitchen  -CTM  #Bilateral osteoarthritis of the knee Oxycodone-acetaminophen 5-325 mg TID prn   Prior to Admission Living Arrangement: Anticipated Discharge Location: Barriers to Discharge: Dispo: Anticipated discharge in approximately 3 day(s).   Linward Natal, MD 08/12/2022, 6:08 PM After 5pm on weekdays and 1pm on weekends: On Call pager [phone]

## 2022-08-12 NOTE — Progress Notes (Signed)
RN, Sam to place new consult when pt is ready to be assessed for PIV placement.

## 2022-08-13 MED ORDER — LACTATED RINGERS IV BOLUS
500.0000 mL | Freq: Once | INTRAVENOUS | Status: AC
  Administered 2022-08-13: 500 mL via INTRAVENOUS

## 2022-08-13 NOTE — Progress Notes (Signed)
Occupational Therapy Treatment Patient Details Name: [name] MRN: [id number] DOB: Dec 30, 1940 Today's Date: 08/13/2022   History of present illness [name] is a 81 yo F with a PMH of HFpEF (last TTE 07/19/2021 EF 55%), CKD IIIb, CAD, SSS s/p pacemaker, PVD, paroxysmal atrial fibrillation on eliquis, OSA, CVA, GERD, HLD, HTN admitted with vertigo.   OT comments  Pt continues to be motivated to participate in OT sessions. Pt performing sponge bath on arrival and nurse tech providing assistance to wash back and for posterior pericare. Pt performing LB bathing with long handled sponge with set-up, but continues to be max A to apply moisturizer. Doffing socks with dressing stick without assistance, but continues to require min A to use sock aid for donning socks due to decreased UE strength and decreased plantar flexion ROM. Continue to recommend HHOT to optimize safety and independence in ADL.    Recommendations for follow up therapy are one component of a multi-disciplinary discharge planning process, led by the attending physician.  Recommendations may be updated based on patient status, additional functional criteria and insurance authorization.    Follow Up Recommendations  Home health OT    Assistance Recommended at Discharge Frequent or constant Supervision/Assistance  Patient can return home with the following  A little help with walking and/or transfers;A lot of help with bathing/dressing/bathroom;Assistance with cooking/housework;Assist for transportation;Help with stairs or ramp for entrance   Equipment Recommendations  None recommended by OT    Recommendations for Other Services      Precautions / Restrictions Precautions Precautions: Fall Precaution Comments: chronic LE wounds Restrictions Weight Bearing Restrictions: No       Mobility Bed Mobility               General bed mobility comments: up in chair on arrival and departure     Transfers Overall transfer level: Needs assistance Equipment used: Rolling walker (2 wheels) Transfers: Sit to/from Stand Sit to Stand: Min guard           General transfer comment: Min guard A for safety     Balance Overall balance assessment: Needs assistance Sitting-balance support: Feet supported Sitting balance-Leahy Scale: Good Sitting balance - Comments: using AE for LB dressing   Standing balance support: Bilateral upper extremity supported Standing balance-Leahy Scale: Poor Standing balance comment: reliant on UE support                           ADL either performed or assessed with clinical judgement   ADL Overall ADL's : Needs assistance/impaired             Lower Body Bathing: Set up;Sitting/lateral leans Lower Body Bathing Details (indicate cue type and reason): washing feet with long handled sponge     Lower Body Dressing: Minimal assistance;Sitting/lateral leans Lower Body Dressing Details (indicate cue type and reason): Pt requiring min A for use of sock aid due to decresed ability to perform plantar flexion and thus, requiring greater leverage to pull sock aid off, leaving sock on foot. Toilet Transfer: Agricultural engineer (2 wheels) (sit<>stand) Armed forces technical officer Details (indicate cue type and reason): RN providing posterior pericare during sponge bath                Extremity/Trunk Assessment Upper Extremity Assessment Upper Extremity Assessment: Generalized weakness (decr grip strength as noted during LBD with use of AE)   Lower Extremity Assessment Lower Extremity Assessment: Defer to PT evaluation  Vision   Additional Comments: no double vision this session   Perception     Praxis      Cognition Arousal/Alertness: Awake/alert Behavior During Therapy: WFL for tasks assessed/performed Overall Cognitive Status: Within Functional Limits for tasks assessed                                  General Comments: following all commands with incresaed time, very pleasant, motivated, and conversational        Exercises      Shoulder Instructions       General Comments VSS    Pertinent Vitals/ Pain       Pain Assessment Pain Assessment: No/denies pain  Home Living                                          Prior Functioning/Environment              Frequency  Min 2X/week        Progress Toward Goals  OT Goals(current goals can now be found in the care plan section)  Progress towards OT goals: Progressing toward goals  Acute Rehab OT Goals Patient Stated Goal: go home OT Goal Formulation: With patient Time For Goal Achievement: 08/21/22 Potential to Achieve Goals: Good ADL Goals Pt Will Perform Upper Body Dressing: with modified independence;sitting Pt Will Perform Lower Body Dressing: with supervision;sit to/from stand;with adaptive equipment Pt Will Transfer to Toilet: bedside commode;stand pivot transfer;with modified independence Additional ADL Goal #1: Pt will perform visual tracking exercises with no report of double vision and no observed nystagmus in preparation for participation in IADL and leisure.  Plan Discharge plan remains appropriate    Co-evaluation                 AM-PAC OT "6 Clicks" Daily Activity     Outcome Measure   Help from another person eating meals?: None Help from another person taking care of personal grooming?: A Little Help from another person toileting, which includes using toliet, bedpan, or urinal?: A Little Help from another person bathing (including washing, rinsing, drying)?: A Lot Help from another person to put on and taking off regular upper body clothing?: A Little Help from another person to put on and taking off regular lower body clothing?: A Lot 6 Click Score: 17    End of Session Equipment Utilized During Treatment: Rolling walker (2 wheels)  OT Visit Diagnosis: Unsteadiness  on feet (R26.81);Muscle weakness (generalized) (M62.81);Dizziness and giddiness (R42);Low vision, both eyes (H54.2);Other abnormalities of gait and mobility (R26.89)   Activity Tolerance Patient tolerated treatment well   Patient Left in chair;with call bell/phone within reach   Nurse Communication Mobility status        Time: 2563-8937 OT Time Calculation (min): 22 min  Charges: OT General Charges $OT Visit: 1 Visit OT Treatments $Self Care/Home Management : 8-22 mins  Shanda Howells, OTR/L [facility] Acute Rehabilitation Office: [phone]   [name] 08/13/2022, 4:09 PM

## 2022-08-13 NOTE — Progress Notes (Signed)
  Progress Note   Date: 08/13/2022  Patient Name: [name]        MRN#: [id number]   Clarification of diagnosis:   severe obesity

## 2022-08-13 NOTE — Care Management Important Message (Signed)
Important Message  Patient Details  Name: [name] MRN: [id number] Date of Birth: 07/05/1941   Medicare Important Message Given:  Yes     Orbie Pyo 08/13/2022, 2:59 PM

## 2022-08-13 NOTE — Progress Notes (Addendum)
Subjective:   Able to eat this morning without emesis. She continues to have intermittent dizziness while sitting and standing. She has had several bowel movements, but still feels like she is constipation. No other complaints or concerns. She feels like she will be ready to go home tomorrow.  Objective:  Vital signs in last 24 hours: Vitals:   08/12/22 2337 08/13/22 0347 08/13/22 0727 08/13/22 1207  BP: (!) 149/72 137/72 (!) 162/82 (!) 166/86  Pulse: 60 60 (!) 59 60  Resp: 18 18 16 17   Temp: 98.2 F (36.8 C) 98.1 F (36.7 C) 98.2 F (36.8 C) 98.1 F (36.7 C)  TempSrc: Axillary Oral Oral Oral  SpO2: 95% 94% 95% 96%  Weight:      Height:       Physical Exam  General: pleasant elderly lady, sitting in bed, NAD. Pulm: normal WOB on RA. MSK: normal ROM bilaterally. Neuro: AAOx3, no focal deficits noted. Skin: warm and dry. Psych: normal mood and affect.      Latest Ref Rng & Units 08/12/2022    7:33 AM 08/11/2022    8:17 AM 08/10/2022    3:59 AM  CBC  WBC 4.0 - 10.5 K/uL 3.4  4.0  3.7   Hemoglobin 12.0 - 15.0 g/dL 11.2  11.4  11.2   Hematocrit 36.0 - 46.0 % 35.4  35.8  35.5   Platelets 150 - 400 K/uL 148  138  132       Latest Ref Rng & Units 08/12/2022    7:33 AM 08/11/2022    8:17 AM 08/10/2022    3:59 AM  CMP  Glucose 70 - 99 mg/dL 101  86  104   BUN 8 - 23 mg/dL 14  13  15    Creatinine 0.44 - 1.00 mg/dL 1.41  1.31  1.45   Sodium 135 - 145 mmol/L 140  142  141   Potassium 3.5 - 5.1 mmol/L 4.1  4.1  4.2   Chloride 98 - 111 mmol/L 107  108  109   CO2 22 - 32 mmol/L 26  27  26    Calcium 8.9 - 10.3 mg/dL 8.3  8.9  8.5       Assessment/Plan:  Principal Problem:   Vertigo Active Problems:   Acute kidney injury superimposed on stage 3a chronic kidney disease (HCC)   Chronic venous stasis dermatitis   Lipodermatosclerosis of both lower extremities   Thrombocytopenia (HCC)   B12 deficiency  [name] is a 81 yo F with a PMH of HFpEF (last  TTE 07/19/2021 EF 55%), CKD IIIb, CAD, SSS s/p pacemaker, PVD, paroxysmal atrial fibrillation on eliquis, OSA, CVA, GERD, HLD, HTN who presented to the ED with vertiginous symptoms with unclear etiology at this time.   #Vertigo  Unclear etiology, although likely peripheral cause. Symptoms have  improved. Differentials include BPPV, Meniere's disease, vestibular neuritis. Had negative dix hallpike with PT. She is having symptomatic improvement with meclizine. Pacemaker not compatible with MRI. Can consider outpatient referral to ENT given presence of tinnitus on R side which appears to correspond with symptoms. If she remains stable overnight, anticipate discharge tomorrow. -meclizine TID prn  #Lightheadedness Concern for medication side effect. Currently holding losartan and providing metoprolol at reduced dose. Holding torsemide. Blood pressures have improved and elevated today. Her orthostatic vitals from today were negative. -continue current antihypertensive management  #Constipation No longer having nausea with PO intake. She is having regular BMs, but she continues to feel constipated. She  does have a history of multiple left-sided colonic diverticula. Low concern for diverticulitis at this point. Afebrile, no leukocytosis.   -Smog enema -Senokot daily -MiraLAX daily -Dulcolax suppository daily prn -Reglan 5 mg TID prn -protonix 40 mg daily  #CKD 3B Cr appears to be relatively stable around 1.3-1.6 since admission. Unclear if this is AKI on CKD or if she is at baseline CKD 3B as of now. -avoid nephrotoxic medications -monitor UOP -trend kidney function  HFpEF CAD HTN Holding home losartan. BP is elevated today but will continue to monitor given concern for vertigo and lightheadedness with tighter BP control. Can likely avoid tight BP control in this elderly lady with multiple comorbidities. -Continue metoprolol succinate 25 mg daily -Hold torsemide, and losartan  -Continue to  monitor for signs and symptoms of heart failure   Paroxysmal afib SSS s/p PPM Followed by [facility] cardiology. Atrially paced, rate 60. Patient is on amiodarone and Eliquis per her cardiologist, Dr. Doylene Canard.  -Continue with pacing -Continue eliquis and amiodarone   #Bilateral osteoarthritis of the knee Oxycodone-acetaminophen 5-325 mg TID prn    Anticipated Discharge Location: TBD Barriers to Discharge: medical management Dispo: Anticipated discharge in approximately 1-2 day(s).   Virl Axe, MD 08/13/2022, 4:30 PM After 5pm on weekdays and 1pm on weekends: On Call pager [phone]

## 2022-08-13 NOTE — TOC Progression Note (Signed)
Transition of Care [facility]) - Progression Note    Patient Details  Name: [name] MRN: [id number] Date of Birth: 1940/12/18  Transition of Care [facility] CM/SW Contact  Pollie Friar, RN Phone Number: 08/13/2022, 1:15 PM  Clinical Narrative:    Elliot Cousin to be updated at d/c. Daughter to provide transport home when medically ready.    Expected Discharge Plan: [place] Barriers to Discharge: Continued Medical Work up  Expected Discharge Plan and Services Expected Discharge Plan: [place]   Discharge Planning Services: CM Consult   Living arrangements for the past 2 months: Single Family Home                           HH Arranged: RN, PT, OT [facility] Agency: [facility]         Social Determinants of Health (SDOH) Interventions    Readmission Risk Interventions    05/28/2020   10:04 AM  Readmission Risk Prevention Plan  Transportation Screening Complete  PCP or Specialist Appt within 3-5 Days Complete  HRI or [place] Complete  Social Work Consult for [place] Planning/Counseling Complete  Palliative Care Screening Not Applicable  Medication Review Press photographer) Complete

## 2022-08-14 DIAGNOSIS — H8121 Vestibular neuronitis, right ear: Secondary | ICD-10-CM | POA: Diagnosis not present

## 2022-08-14 DIAGNOSIS — R42 Dizziness and giddiness: Secondary | ICD-10-CM | POA: Diagnosis not present

## 2022-08-14 DIAGNOSIS — H8190 Unspecified disorder of vestibular function, unspecified ear: Secondary | ICD-10-CM

## 2022-08-14 LAB — BASIC METABOLIC PANEL
Anion gap: 6 (ref 5–15)
BUN: 12 mg/dL (ref 8–23)
CO2: 23 mmol/L (ref 22–32)
Calcium: 8 mg/dL — ABNORMAL LOW (ref 8.9–10.3)
Chloride: 108 mmol/L (ref 98–111)
Creatinine, Ser: 1.34 mg/dL — ABNORMAL HIGH (ref 0.44–1.00)
GFR, Estimated: 41 mL/min — ABNORMAL LOW (ref >60)
Glucose, Bld: 118 mg/dL — ABNORMAL HIGH (ref 70–99)
Potassium: 4.5 mmol/L (ref 3.5–5.1)
Sodium: 137 mmol/L (ref 135–145)

## 2022-08-14 LAB — CBC
HCT: 37.7 % (ref 36.0–46.0)
Hemoglobin: 12.1 g/dL (ref 12.0–15.0)
MCH: 33.2 pg (ref 26.0–34.0)
MCHC: 32.1 g/dL (ref 30.0–36.0)
MCV: 103.3 fL — ABNORMAL HIGH (ref 80.0–100.0)
Platelets: 142 10*3/uL — ABNORMAL LOW (ref 150–400)
RBC: 3.65 MIL/uL — ABNORMAL LOW (ref 3.87–5.11)
RDW: 17.7 % — ABNORMAL HIGH (ref 11.5–15.5)
WBC: 3.7 10*3/uL — ABNORMAL LOW (ref 4.0–10.5)
nRBC: 0.8 % — ABNORMAL HIGH (ref 0.0–0.2)

## 2022-08-14 MED ORDER — APIXABAN 5 MG PO TABS: 5.0000 mg | ORAL_TABLET | Freq: Two times a day (BID) | ORAL | 3 refills | Status: DC

## 2022-08-14 MED ORDER — METOPROLOL SUCCINATE ER 25 MG PO TB24: 25.0000 mg | ORAL_TABLET | Freq: Every day | ORAL | 0 refills | Status: DC

## 2022-08-14 MED ORDER — ACETAMINOPHEN 325 MG PO TABS: 650.0000 mg | ORAL_TABLET | Freq: Four times a day (QID) | ORAL | Status: AC

## 2022-08-14 MED ORDER — METOCLOPRAMIDE HCL 5 MG PO TABS: 5.0000 mg | ORAL_TABLET | Freq: Three times a day (TID) | ORAL | 3 refills | Status: DC

## 2022-08-14 NOTE — Plan of Care (Signed)

## 2022-08-14 NOTE — Progress Notes (Signed)
Discharge instructions given.  Waiting for niece to come pick up so she can go home.  Per MD request, Eliquis and Crestor night dose given.

## 2022-08-14 NOTE — Discharge Summary (Signed)
Name: [name] MRN: [id number] DOB: Apr 23, 1941 81 y.o. PCP: Rocky Morel, MD  Date of Admission: 08/06/2022  7:39 AM Date of Discharge: 08/14/2022  Attending Physician: Axel Filler, *  Discharge Diagnosis: 1. Principal Problem:   Acute vestibular syndrome Active Problems:   Vertigo   Acute kidney injury superimposed on stage 3a chronic kidney disease (HCC)   Chronic venous stasis dermatitis   Lipodermatosclerosis of both lower extremities   Thrombocytopenia (HCC)   B12 deficiency    Discharge Medications: Allergies as of 08/14/2022       Reactions   Omnipen [ampicillin] Other (See Comments)   Increases heart rhythm   Penicillins Other (See Comments)   Increases heart rhythm   Linzess [linaclotide] Other (See Comments)   Severe stomach pains        Medication List     STOP taking these medications    amLODipine 5 MG tablet Commonly known as: NORVASC   isosorbide mononitrate 30 MG 24 hr tablet Commonly known as: IMDUR   losartan 100 MG tablet Commonly known as: COZAAR   pantoprazole 40 MG tablet Commonly known as: PROTONIX   Potassium Chloride ER 20 MEQ Tbcr   potassium chloride SA 20 MEQ tablet Commonly known as: KLOR-CON M   sulfamethoxazole-trimethoprim 800-160 MG tablet Commonly known as: BACTRIM DS   torsemide 20 MG tablet Commonly known as: Demadex       TAKE these medications    acetaminophen 325 MG tablet Commonly known as: TYLENOL Take 2 tablets (650 mg total) by mouth every 6 (six) hours.   allopurinol 300 MG tablet Commonly known as: ZYLOPRIM Take 300 mg by mouth daily.   amiodarone 200 MG tablet Commonly known as: PACERONE Take 200 mg by mouth daily.   apixaban 5 MG Tabs tablet Commonly known as: ELIQUIS Take 1 tablet (5 mg total) by mouth 2 (two) times daily.   doxercalciferol 0.5 MCG capsule Commonly known as: HECTOROL Take 0.5 mcg by mouth daily.   famotidine 40 MG  tablet Commonly known as: PEPCID Take 40 mg by mouth daily. What changed: Another medication with the same name was removed. Continue taking this medication, and follow the directions you see here.   Farxiga 10 MG Tabs tablet Generic drug: dapagliflozin propanediol Take 10 mg by mouth daily.   liver oil-zinc oxide 40 % ointment Commonly known as: DESITIN Apply topically 3 (three) times daily as needed for irritation.   meclizine 25 MG tablet Commonly known as: ANTIVERT Take 25 mg by mouth 3 (three) times daily as needed for dizziness.   metoCLOPramide 5 MG tablet Commonly known as: REGLAN Take 1 tablet (5 mg total) by mouth 3 (three) times daily before meals. What changed: how much to take   metoprolol succinate 25 MG 24 hr tablet Commonly known as: TOPROL-XL Take 1 tablet (25 mg total) by mouth daily. What changed:  medication strength how much to take   mupirocin ointment 2 % Commonly known as: BACTROBAN Apply 1 Application topically 2 (two) times daily.   oxyCODONE-acetaminophen 5-325 MG tablet Commonly known as: PERCOCET/ROXICET Take 1 tablet by mouth 3 (three) times daily as needed.   polyethylene glycol 17 g packet Commonly known as: MIRALAX / GLYCOLAX Take 17 g by mouth daily as needed for mild constipation.   rosuvastatin 10 MG tablet Commonly known as: CRESTOR Take 10 mg by mouth at bedtime.   silver sulfADIAZINE 1 % cream Commonly known as: SILVADENE Apply 1 Application topically 2 (two)  times daily.        Disposition and follow-up:   [name] was discharged from [facility] in [place] condition.  At the hospital follow up visit please address:  1.  Vertigo, further titration of metoprolol and when to resume losartan and what dose as well as torsemide, discuss if patient is taking amiodarone and eliquis  2.  Labs / imaging needed at time of follow-up: CBC (platelet count), hemoglobin, BMP serum creatinine   3.   Pending labs/ test needing follow-up: None   Follow-up Appointments: Please follow up with your PCP in 1-2 weeks   Hospital Course by problem list: [name] is a 81 yo F with a PMH of HFpEF (last TTE 07/19/2021 EF 55%), CKD IIIb, CAD, SSS s/p pacemaker, PVD, paroxysmal atrial fibrillation on eliquis, OSA, CVA, GERD, HLD, HTN who presented to the ED with vertiginous symptoms of unclear etiology at this time.    #Acute vestibular syndrome  Unclear etiology, although likely peripheral cause. Symptoms have  improved. Differential included BPPV, Meniere's disease, and vestibular neuritis . Had negative dix hallpike with PT. She is having symptomatic improvement with meclizine. Pacemaker not compatible with MRI so unable to fully evaluate for posterior circulation stroke however CT head was negative for acute infarct.. Can consider outpatient referral to ENT given presence of tinnitus on R side which appears to correspond with symptoms, if patients symptoms worsen or become detrimental to her quality of life.   Patient utilizes  pill packs and is currently not on meclizine per pill packs. She will receive her dose and our team will follow up with her pharmacy on Monday, when they open to make sure this medication is added as needed for her vertiginous symptoms.   #Lightheadedness Concern for medication side effect. This improved with reduction of her home antihypertensive regimen. We held her home losartan and reduced her metoprolol 25mg  a day. Also held her home torsemide as she was euvolemic. Her blood pressures remained stable to elevated. But given her symptoms okay with more elevated blood pressures for now. Patient will discharge without metoprolol for tomorrow and her medications will be called in on Monday.     #Constipation Patient had some nausea during her hospitalization that improved with regular bowel movements and reglan. She did not feel she needed to take any stool  softeners or laxatives.  -Will continue patient's reglan dose at 5mg  TID, however she will not be able to get this dose until her pharmacy is contacted on Monday. (2.5mg  TID for tomorrow). Continue stool softeners and senna as needed.    #CKD 3B Patient's serum creatinine  is around her baseline of 1.3-1.6. She may have had an AKI on CKD. However, on discharge her serum creatinine was back to baseline.     HFpEF CAD HTN Patient was symptomatic from her antihypertensive/HFpEF regimen. Her metoprolol was decreased to 25mg  daily and her symptoms continued to improve. She will discharge without her torsemide, metoprolol and losartan. She will have follow up with her PCP and cardiologist to discuss when to resume these medications. It is likely that tight blood pressure control can be avoided in elderly women with multiple co morbidities.   Paroxysmal afib SSS s/p PPM [facility] cardiology. Atrially paced, rate 60. Patient is on amiodarone and Eliquis per her cardiologist, Dr. Doylene Canard. She unfortunately has not been receiving these with her pill packs.   -Will discuss with her pharmacy about adding amiodarone and eliquis. She  will also have follow up with her cardiologist in 1-2 weeks.   #Bilateral osteoarthritis of the knee Oxycodone-acetaminophen 5-325 mg TID prn   Discharge Exam:   BP 136/63 (BP Location: Left Wrist)   Pulse 65   Temp 97.6 F (36.4 C) (Oral)   Resp 16   Ht 5\' 6"  (1.676 m)   Wt (!) 152.9 kg   SpO2 97%   BMI 54.39 kg/m  Discharge exam:   Constitutional: Chronically ill appearing and in no distress.  HENT:  Head: Normocephalic and atraumatic.  Eyes: EOM are normal.  Neck: Normal range of motion.  Cardiovascular: atrially paced, intact distal pulses. No gallop and no friction rub.  No murmur heard. No lower extremity edema  Pulmonary: Non labored breathing on room air, no wheezing or rales  Abdominal: Soft. Normal bowel sounds. Non distended and non  tender Musculoskeletal:    Neurological: Alert and oriented to person, place, and time. Non focal  Skin: Skin is warm and dry. Lipo dermatosclerosis of bilateral lower extremities with trace pitting edema. R lower extremity with wound, healing appropriately, no exudate    Pertinent Labs, Studies, and Procedures:  CT HEAD WO CONTRAST (5MM)  Result Date: 08/06/2022 CLINICAL DATA:  Dizziness EXAM: CT HEAD WITHOUT CONTRAST TECHNIQUE: Contiguous axial images were obtained from the base of the skull through the vertex without intravenous contrast. RADIATION DOSE REDUCTION: This exam was performed according to the departmental dose-optimization program which includes automated exposure control, adjustment of the mA and/or kV according to patient size and/or use of iterative reconstruction technique. COMPARISON:  08/17/2021 FINDINGS: Brain: No acute intracranial findings are seen. There are no signs of bleeding within the cranium. Cortical sulci are prominent. There is decreased density in periventricular and subcortical white matter. Vascular: Unremarkable. Skull: Unremarkable. Sinuses/Orbits: Unremarkable. Other: None. IMPRESSION: No acute intracranial findings are seen in noncontrast CT brain. Atrophy. Small-vessel disease. Electronically Signed   By: Elmer Picker M.D.   On: 08/06/2022 19:50     Discharge Instructions:   Signed: Rick Duff, MD 08/14/2022, 3:46 PM

## 2022-08-14 NOTE — Progress Notes (Addendum)
Physical Therapy Treatment Patient Details Name: [name] MRN: [id number] DOB: 1940-12-25 Today's Date: 08/14/2022   History of Present Illness [name] is a 81 yo F admitted with vertigo. PMH of HFpEF (last TTE 07/19/2021 EF 55%), CKD IIIb, CAD, SSS s/p pacemaker, PVD, paroxysmal atrial fibrillation on eliquis, OSA, CVA, GERD, HLD, HTN.    PT Comments    Pt received in recliner, pleasantly agreeable to therapy session and with good participation and improved tolerance for gait and transfer training in room. Emphasis on gradual progression of mobility within tolerance, self-monitoring for symptoms of vertigo, gaze stabilization exercises and ROM of LE for strengthening/improved hemodynamics and activity pacing/safety with gait. Pt able to perform short gait tasks x 2 at bedside (up to 44ft prior to needing seated break) with close chair follow for safety. Pt needing up to [place] for transfers, not yet able to tolerate stair training due to severe fatigue 7/10 modified RPE after less than household distance gait trials in room. BP elevated post-exertion, see below, RN notified. Pt continues to benefit from PT services to progress toward functional mobility goals.   Recommendations for follow up therapy are one component of a multi-disciplinary discharge planning process, led by the attending physician.  Recommendations may be updated based on patient status, additional functional criteria and insurance authorization.  Follow Up Recommendations  Home health PT     Assistance Recommended at Discharge Frequent or constant Supervision/Assistance  Patient can return home with the following A lot of help with walking and/or transfers;A lot of help with bathing/dressing/bathroom;Assistance with cooking/housework;Assistance with feeding;Assist for transportation;Help with stairs or ramp for entrance   Equipment Recommendations  None recommended by PT    Recommendations for  Other Services       Precautions / Restrictions Precautions Precautions: Fall Precaution Comments: chronic LE wounds, elevated BP with exertion Restrictions Weight Bearing Restrictions: No     Mobility  Bed Mobility Overal bed mobility: Needs Assistance Bed Mobility: Rolling, Sit to Sidelying Rolling: Supervision       Sit to sidelying: Min assist General bed mobility comments: BLE assist to clear edge of bed; minA +2 for supine scooting toward HOB with pt using overhead rails/BLE and bed in trend    Transfers Overall transfer level: Needs assistance Equipment used: Rolling walker (2 wheels) Transfers: Sit to/from Stand Sit to Stand: Mod assist, +2 safety/equipment           General transfer comment: from recliner chair>RW, pt needing feet blocked for safety as they are sliding forward during transfer; x2 reps    Ambulation/Gait Ambulation/Gait assistance: Min guard, +2 safety/equipment Gait Distance (Feet): 22 Feet (76ft, seated break, 12ft) Assistive device: Rolling walker (2 wheels) (bariatric) Gait Pattern/deviations: Step-to pattern, Step-through pattern, Decreased stride length, Trunk flexed, Wide base of support       General Gait Details: Fair pace and good use of RW, min guard for safety with chair follow by tech due to pt previous hx vertigo; no vertigo sx reported; pt fatigues quickly needing chair pulled up behind her for rest break.      Balance Overall balance assessment: Needs assistance Sitting-balance support: Feet supported Sitting balance-Leahy Scale: Good     Standing balance support: Bilateral upper extremity supported Standing balance-Leahy Scale: Poor Standing balance comment: reliant on UE support                            Cognition Arousal/Alertness: Awake/alert Behavior  During Therapy: WFL for tasks assessed/performed Overall Cognitive Status: Within Functional Limits for tasks assessed                                  General Comments: following all commands with increased time, very pleasant, motivated, and conversational. 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        Exercises Other Exercises Other Exercises: VOR x1 3 reps (no symptoms reported) Other Exercises: seated horizontal and vertical smooth pursuits x3 reps ea direction (no sx reported) Other Exercises: seated BLE AROM: ankle pumps, LAQ x 5 reps ea Other Exercises: STS x 2    General Comments General comments (skin integrity, edema, etc.): BP 161/79 seated in chair after first gait trial; BP 175/78 supine post-exertion; HR 59-65 with exertional tasks; SpO2 WFL on RA      Pertinent Vitals/Pain Pain Assessment Pain Assessment: No/denies pain Pain Intervention(s): Monitored during session, Repositioned           PT Goals (current goals can now be found in the care plan section) Acute Rehab PT Goals Patient Stated Goal: less dizzy and moving around better so I can go home PT Goal Formulation: With patient Time For Goal Achievement: 08/21/22 Progress towards PT goals: Progressing toward goals    Frequency    Min 3X/week      PT Plan Current plan remains appropriate       AM-PAC PT "6 Clicks" Mobility   Outcome Measure  Help needed turning from your back to your side while in a flat bed without using bedrails?: A Little Help needed moving from lying on your back to sitting on the side of a flat bed without using bedrails?: A Lot Help needed moving to and from a bed to a chair (including a wheelchair)?: A Little Help needed standing up from a chair using your arms (e.g., wheelchair or bedside chair)?: A Lot Help needed to walk in hospital room?: A Little Help needed climbing 3-5 steps with a railing? : Total 6 Click Score: 14    End of Session Equipment Utilized During Treatment: Gait belt Activity Tolerance: Patient tolerated treatment well;Treatment limited secondary to medical complications (Comment);Patient  limited by fatigue (BP elevated with exertion) Patient left: in bed;with call bell/phone within reach;with bed alarm set Nurse Communication: Mobility status;Other (comment) (elevated BP with exertion) PT Visit Diagnosis: Other abnormalities of gait and mobility (R26.89);Dizziness and giddiness (R42)     Time: 6387-5643 PT Time Calculation (min) (ACUTE ONLY): 32 min  Charges:  $Gait Training: 8-22 mins $Therapeutic Exercise: 8-22 mins                     Clay Solum P., PTA Acute Rehabilitation Services Secure Chat Preferred 9a-5:30pm Office: [place] 08/14/2022, 4:41 PM

## 2022-08-16 ENCOUNTER — Other Ambulatory Visit: Payer: Self-pay | Admitting: Student

## 2022-08-16 DIAGNOSIS — I480 Paroxysmal atrial fibrillation: Secondary | ICD-10-CM

## 2022-08-16 MED ORDER — AMIODARONE HCL 200 MG PO TABS: 200.0000 mg | ORAL_TABLET | Freq: Every day | ORAL | 2 refills | Status: DC

## 2022-08-16 NOTE — Progress Notes (Signed)
Called patient's pharmacy, [facility] 08/16/2022 and discussed the changes that were made to her medication regimen.   These included stopping losartan, amlodipine, and toprol xl 100mg .   Started amiodarone 200mg  qd, eliquis 5mg  BID, reglan 5mg  TID, and toprol xl 25mg  daily.    She has close follow up scheduled with Dr. Arvella Nigh as well as her cardiologist Dr. Doylene Canard.   Discussed with patient to call our clinic if she has any questions.

## 2022-11-09 ENCOUNTER — Other Ambulatory Visit: Payer: Self-pay | Admitting: Student

## 2023-01-10 ENCOUNTER — Other Ambulatory Visit: Payer: Self-pay | Admitting: Student

## 2023-02-01 ENCOUNTER — Other Ambulatory Visit: Payer: Self-pay

## 2023-02-01 ENCOUNTER — Encounter (HOSPITAL_BASED_OUTPATIENT_CLINIC_OR_DEPARTMENT_OTHER): Payer: Self-pay | Admitting: Urology

## 2023-02-01 ENCOUNTER — Emergency Department (HOSPITAL_BASED_OUTPATIENT_CLINIC_OR_DEPARTMENT_OTHER)
Admission: EM | Admit: 2023-02-01 | Discharge: 2023-02-01 | Disposition: A | Discharge status: Home / Self Care | Payer: 59 | Attending: Emergency Medicine | Admitting: Emergency Medicine

## 2023-02-01 DIAGNOSIS — I509 Heart failure, unspecified: Secondary | ICD-10-CM | POA: Insufficient documentation

## 2023-02-01 DIAGNOSIS — Z7901 Long term (current) use of anticoagulants: Secondary | ICD-10-CM | POA: Insufficient documentation

## 2023-02-01 DIAGNOSIS — R600 Localized edema: Secondary | ICD-10-CM | POA: Insufficient documentation

## 2023-02-01 DIAGNOSIS — L089 Local infection of the skin and subcutaneous tissue, unspecified: Secondary | ICD-10-CM | POA: Diagnosis not present

## 2023-02-01 DIAGNOSIS — Z79899 Other long term (current) drug therapy: Secondary | ICD-10-CM | POA: Diagnosis not present

## 2023-02-01 DIAGNOSIS — I2510 Atherosclerotic heart disease of native coronary artery without angina pectoris: Secondary | ICD-10-CM | POA: Insufficient documentation

## 2023-02-01 DIAGNOSIS — M79604 Pain in right leg: Secondary | ICD-10-CM | POA: Diagnosis present

## 2023-02-01 DIAGNOSIS — L03115 Cellulitis of right lower limb: Secondary | ICD-10-CM | POA: Diagnosis not present

## 2023-02-01 DIAGNOSIS — I110 Hypertensive heart disease with heart failure: Secondary | ICD-10-CM | POA: Insufficient documentation

## 2023-02-01 MED ORDER — DOXYCYCLINE HYCLATE 100 MG PO CAPS: 100.0000 mg | ORAL_CAPSULE | Freq: Two times a day (BID) | ORAL | 0 refills | Status: AC

## 2023-02-01 MED ORDER — DOXYCYCLINE HYCLATE 100 MG PO TABS
100.0000 mg | ORAL_TABLET | Freq: Once | ORAL | Status: AC
  Administered 2023-02-01: 100 mg via ORAL
  Filled 2023-02-01: qty 1

## 2023-02-01 NOTE — ED Triage Notes (Signed)
Pt state wound to right leg that was dressed by home health RN and noticed to be infected Pt states wound has been there for approx 2 months  Wound painful with purulent drainage per pt

## 2023-02-01 NOTE — ED Provider Notes (Signed)
[place] [facility] Provider Note   CSN: [id number] Arrival date & time: 02/01/23  1652     History  Chief Complaint  Patient presents with   Wound Infection    [name] is a 82 y.o. female.  HPI   82 year old female with medical history significant for lymphedema, CAD, HTN, HLD, CHF, chronic right lower extremity wound for the past 2 months who was sent to the emergency department by home health nursing due to concern for infection of her chronic wound.  The patient states that she had skin breakdown her right lower extremity on the lateral aspect of the leg that has been managed with a home health nurse changing dressings daily.  She was sent to the emergency department because her home health nurse noticed today that it has become more purulent with surrounding skin changes concerning for cellulitis.  She endorses worsening pain in the vicinity of the wound.  No fevers or chills.  No rapid progression of redness of swelling.    Home Medications Prior to Admission medications   Medication Sig Start Date End Date Taking? Authorizing Provider  doxycycline (VIBRAMYCIN) 100 MG capsule Take 1 capsule (100 mg total) by mouth 2 (two) times daily for 7 days. 02/01/23 02/08/23 Yes Ernie AvenaLawsing, Quy Lotts, MD  acetaminophen (TYLENOL) 325 MG tablet Take 2 tablets (650 mg total) by mouth every 6 (six) hours. 08/14/22   Marolyn Hallerarter, [place], MD  allopurinol (ZYLOPRIM) 300 MG tablet Take 300 mg by mouth daily. 05/18/22   [provider]  amiodarone (PACERONE) 200 MG tablet Take 1 tablet (200 mg total) by mouth daily. 08/16/22   Marolyn Hallerarter, [place], MD  apixaban (ELIQUIS) 5 MG TABS tablet Take 1 tablet (5 mg total) by mouth 2 (two) times daily. 08/14/22   Marolyn Hallerarter, [place], MD  doxercalciferol (HECTOROL) 0.5 MCG capsule Take 0.5 mcg by mouth daily. 05/18/22   [provider]  famotidine (PEPCID) 40 MG tablet Take 40 mg by mouth daily. 11/29/19    [provider]  FARXIGA 10 MG TABS tablet Take 10 mg by mouth daily. 05/18/22   [provider]  liver oil-zinc oxide (DESITIN) 40 % ointment Apply topically 3 (three) times daily as needed for irritation. 06/15/22   Nooruddin, Jason FilaSaad, MD  meclizine (ANTIVERT) 25 MG tablet Take 25 mg by mouth 3 (three) times daily as needed for dizziness. 05/18/22   [provider]  metoCLOPramide (REGLAN) 5 MG tablet Take 1 tablet (5 mg total) by mouth 3 (three) times daily before meals. 08/14/22   Marolyn Hallerarter, [place], MD  metoprolol succinate (TOPROL-XL) 25 MG 24 hr tablet Take 1 tablet (25 mg total) by mouth daily. 08/14/22   Marolyn Hallerarter, [place], MD  mupirocin ointment (BACTROBAN) 2 % Apply 1 Application topically 2 (two) times daily. 05/31/22   [provider]  oxyCODONE-acetaminophen (PERCOCET/ROXICET) 5-325 MG tablet Take 1 tablet by mouth 3 (three) times daily as needed. 07/23/22   [provider]  polyethylene glycol (MIRALAX / GLYCOLAX) 17 g packet Take 17 g by mouth daily as needed for mild constipation.    [provider]  rosuvastatin (CRESTOR) 10 MG tablet Take 10 mg by mouth at bedtime.    [provider]  silver sulfADIAZINE (SILVADENE) 1 % cream Apply 1 Application topically 2 (two) times daily. 05/31/22   [provider]      Allergies    Omnipen [ampicillin], Penicillins, and Linzess [linaclotide]    Review of Systems   Review  of Systems  All other systems reviewed and are negative.   Physical Exam Updated Vital Signs BP (!) 183/70 (BP Location: Right Arm)   Pulse 79   Temp 98 F (36.7 C)   Resp 20   Ht 5\' 6"  (1.676 m)   Wt (!) 153.8 kg   SpO2 96%   BMI 54.72 kg/m  Physical Exam Vitals and nursing note reviewed.  Constitutional:      General: She is not in acute distress.    Appearance: She is well-developed. She is obese.  HENT:     Head: Normocephalic and atraumatic.  Eyes:     Conjunctiva/sclera: Conjunctivae  normal.  Cardiovascular:     Rate and Rhythm: Normal rate and regular rhythm.     Heart sounds: No murmur heard. Pulmonary:     Effort: Pulmonary effort is normal. No respiratory distress.     Breath sounds: Normal breath sounds.  Abdominal:     Palpations: Abdomen is soft.     Tenderness: There is no abdominal tenderness.  Musculoskeletal:     Cervical back: Neck supple.     Right lower leg: Edema present.     Left lower leg: Edema present.     Comments: Bilateral lower extremity skin changes consistent with chronic lymphedema.  3 cm ulcerative wound located along the lateral aspect of the right leg with surrounding mild tenderness, erythema and warmth with mild purulence that is foul-smelling noted, no crepitus  Skin:    General: Skin is warm and dry.     Capillary Refill: Capillary refill takes less than 2 seconds.  Neurological:     Mental Status: She is alert.  Psychiatric:        Mood and Affect: Mood normal.      ED Results / Procedures / Treatments   Labs (all labs ordered are listed, but only abnormal results are displayed) Labs Reviewed - No data to display  EKG None  Radiology No results found.  Procedures Procedures    Medications Ordered in ED Medications  doxycycline (VIBRA-TABS) tablet 100 mg (has no administration in time range)    ED Course/ Medical Decision Making/ A&P                             Medical Decision Making Risk Prescription drug management.   82 year old female with medical history significant for lymphedema, CAD, HTN, HLD, CHF, chronic right lower extremity wound for the past 2 months who was sent to the emergency department by home health nursing due to concern for infection of her chronic wound.  The patient states that she had skin breakdown her right lower extremity on the lateral aspect of the leg that has been managed with a home health nurse changing dressings daily.  She was sent to the emergency department because her  home health nurse noticed today that it has become more purulent with surrounding skin changes concerning for cellulitis.  She endorses worsening pain in the vicinity of the wound.  No fevers or chills.  No rapid progression of redness of swelling.   On arrival, the patient was vitally stable, afebrile, not tachycardic or tachypneic, initially BP 183/70, accepting improved to 150 systolic, saturating 96% on room air.  Physical exam concerning for cellulitis surrounding a right lower extremity wound.  Low concern for necrotizing fasciitis based on the patient's physical exam and presentation.  Vitally stable, low concern for systemic infection at this time.  Will initiate the patient on a course of doxycycline, advised continued outpatient wound management, return precautions provided.   Final Clinical Impression(s) / ED Diagnoses Final diagnoses:  Cellulitis of right lower extremity  Wound infection    Rx / DC Orders ED Discharge Orders          Ordered    doxycycline (VIBRAMYCIN) 100 MG capsule  2 times daily        02/01/23 1732              Ernie Avena, MD 02/01/23 1736

## 2023-02-01 NOTE — Discharge Instructions (Addendum)
You have evidence of cellulitis and infection of your chronic right lower extremity wound for which we will start you on doxycycline.  Follow-up outpatient with wound care and your PCP, return if you have worsening symptoms of infection despite outpatient antibiotics.

## 2023-02-01 NOTE — ED Notes (Signed)
ED Provider at bedside. 

## 2023-08-02 ENCOUNTER — Emergency Department (HOSPITAL_COMMUNITY): Payer: 59

## 2023-08-02 ENCOUNTER — Other Ambulatory Visit: Payer: Self-pay

## 2023-08-02 ENCOUNTER — Encounter (HOSPITAL_COMMUNITY): Payer: Self-pay | Admitting: Emergency Medicine

## 2023-08-02 ENCOUNTER — Emergency Department (HOSPITAL_COMMUNITY)
Admission: EM | Admit: 2023-08-02 | Discharge: 2023-08-02 | Disposition: A | Discharge status: Home / Self Care | Payer: 59 | Attending: Emergency Medicine | Admitting: Emergency Medicine

## 2023-08-02 DIAGNOSIS — Z7901 Long term (current) use of anticoagulants: Secondary | ICD-10-CM | POA: Diagnosis not present

## 2023-08-02 DIAGNOSIS — R0789 Other chest pain: Secondary | ICD-10-CM | POA: Insufficient documentation

## 2023-08-02 DIAGNOSIS — R112 Nausea with vomiting, unspecified: Secondary | ICD-10-CM | POA: Diagnosis not present

## 2023-08-02 LAB — BASIC METABOLIC PANEL
Anion gap: 12 (ref 5–15)
BUN: 20 mg/dL (ref 8–23)
CO2: 24 mmol/L (ref 22–32)
Calcium: 9 mg/dL (ref 8.9–10.3)
Chloride: 105 mmol/L (ref 98–111)
Creatinine, Ser: 1.69 mg/dL — ABNORMAL HIGH (ref 0.44–1.00)
GFR, Estimated: 31 mL/min — ABNORMAL LOW (ref >60)
Glucose, Bld: 104 mg/dL — ABNORMAL HIGH (ref 70–99)
Potassium: 3.8 mmol/L (ref 3.5–5.1)
Sodium: 141 mmol/L (ref 135–145)

## 2023-08-02 LAB — CBC
HCT: 43.9 % (ref 36.0–46.0)
Hemoglobin: 13.8 g/dL (ref 12.0–15.0)
MCH: 30.9 pg (ref 26.0–34.0)
MCHC: 31.4 g/dL (ref 30.0–36.0)
MCV: 98.4 fL (ref 80.0–100.0)
Platelets: 172 10*3/uL (ref 150–400)
RBC: 4.46 MIL/uL (ref 3.87–5.11)
RDW: 18.3 % — ABNORMAL HIGH (ref 11.5–15.5)
WBC: 4.1 10*3/uL (ref 4.0–10.5)
nRBC: 0.5 % — ABNORMAL HIGH (ref 0.0–0.2)

## 2023-08-02 LAB — TROPONIN I (HIGH SENSITIVITY)
Troponin I (High Sensitivity): 9 ng/L (ref <18)
Troponin I (High Sensitivity): 8 ng/L (ref <18)

## 2023-08-02 NOTE — Discharge Instructions (Addendum)
Please be sure to follow-up with your cardiologist tomorrow morning.  Return here for concerning changes in your condition.

## 2023-08-02 NOTE — ED Triage Notes (Signed)
Pt arrives from home via [facility]  EMS states last night 2030, pt ambulating in house with sudden onset chest pain radiating to L arm, with SOB and nausea. Pt reports pain continues today. PT continues to complain of SOB. Pt with pacemaker, underlying Afib. Hx CHF Pt anticoagulated with eliquis.  2 SL nitroglycerin via EMS 8/10 to 6/10 22 L hand

## 2023-08-02 NOTE — ED Provider Notes (Signed)
[facility]ERG[facility] DEPARTMENT AT [facility] Provider Note   CSN: [id number] Arrival date & time: 08/02/23  1345     History  Chief Complaint  Patient presents with   Chest Pain    [name] is a 82 y.o. female.  HPI Patient presents with her great grandson.  She arrives via EMS.  History is obtained by those individuals as well.  She presents with chest pain that began yesterday about 20 hours prior to ED arrival.  She acknowledges multiple medical problems but states that she was well prior to onset of symptoms.  With initial chest tightness she subsequently developed nausea, vomiting.  Pain is some what better, but with occasional bursts of tachycardia. No fever, no new dyspnea, no abdominal pain no other complaints.  EMS rhythm strips with paced rhythm, rate 65, substantial artifact.     Home Medications Prior to Admission medications   Medication Sig Start Date End Date Taking? Authorizing Provider  acetaminophen (TYLENOL) 325 MG tablet Take 2 tablets (650 mg total) by mouth every 6 (six) hours. 08/14/22   Marolyn Haller, MD  allopurinol (ZYLOPRIM) 300 MG tablet Take 300 mg by mouth daily. 05/18/22   [provider]  amiodarone (PACERONE) 200 MG tablet Take 1 tablet (200 mg total) by mouth daily. 08/16/22   Marolyn Haller, MD  apixaban (ELIQUIS) 5 MG TABS tablet Take 1 tablet (5 mg total) by mouth 2 (two) times daily. 08/14/22   Marolyn Haller, MD  doxercalciferol (HECTOROL) 0.5 MCG capsule Take 0.5 mcg by mouth daily. 05/18/22   [provider]  famotidine (PEPCID) 40 MG tablet Take 40 mg by mouth daily. 11/29/19   [provider]  FARXIGA 10 MG TABS tablet Take 10 mg by mouth daily. 05/18/22   [provider]  liver oil-zinc oxide (DESITIN) 40 % ointment Apply topically 3 (three) times daily as needed for irritation. 06/15/22   Nooruddin, Jason Fila, MD  meclizine (ANTIVERT) 25 MG tablet Take 25 mg by mouth 3 (three)  times daily as needed for dizziness. 05/18/22   [provider]  metoCLOPramide (REGLAN) 5 MG tablet Take 1 tablet (5 mg total) by mouth 3 (three) times daily before meals. 08/14/22   Marolyn Haller, MD  metoprolol succinate (TOPROL-XL) 25 MG 24 hr tablet Take 1 tablet (25 mg total) by mouth daily. 08/14/22   Marolyn Haller, MD  mupirocin ointment (BACTROBAN) 2 % Apply 1 Application topically 2 (two) times daily. 05/31/22   [provider]  oxyCODONE-acetaminophen (PERCOCET/ROXICET) 5-325 MG tablet Take 1 tablet by mouth 3 (three) times daily as needed. 07/23/22   [provider]  polyethylene glycol (MIRALAX / GLYCOLAX) 17 g packet Take 17 g by mouth daily as needed for mild constipation.    [provider]  rosuvastatin (CRESTOR) 10 MG tablet Take 10 mg by mouth at bedtime.    [provider]  silver sulfADIAZINE (SILVADENE) 1 % cream Apply 1 Application topically 2 (two) times daily. 05/31/22   [provider]      Allergies    Omnipen [ampicillin], Penicillins, and Linzess [linaclotide]    Review of Systems   Review of Systems  All other systems reviewed and are negative.   Physical Exam Updated Vital Signs BP 129/61   Pulse (!) 57   Temp 97.6 F (36.4 C) (Oral)   Resp 17   Ht 5\' 6"  (1.676 m)   Wt (!) 152.4 kg   SpO2 97%   BMI 54.23  kg/m  Physical Exam Vitals and nursing note reviewed.  Constitutional:      General: She is not in acute distress.    Appearance: She is well-developed. She is obese. She is not ill-appearing or diaphoretic.  HENT:     Head: Normocephalic and atraumatic.  Eyes:     Conjunctiva/sclera: Conjunctivae normal.  Cardiovascular:     Rate and Rhythm: Normal rate and regular rhythm.  Pulmonary:     Effort: Pulmonary effort is normal. No respiratory distress.     Breath sounds: Normal breath sounds. No stridor.  Chest:     Comments: Pacemaker right upper chest wall unremarkable Abdominal:      General: There is no distension.  Musculoskeletal:     Comments: Substantial lower extremity lymphedema, no obvious erythema, no purulence, no bleeding  Skin:    General: Skin is warm and dry.  Neurological:     Mental Status: She is alert and oriented to person, place, and time.     Cranial Nerves: No cranial nerve deficit.  Psychiatric:        Mood and Affect: Mood normal.     ED Results / Procedures / Treatments   Labs (all labs ordered are listed, but only abnormal results are displayed) Labs Reviewed  BASIC METABOLIC PANEL - Abnormal; Notable for the following components:      Result Value   Glucose, Bld 104 (*)    Creatinine, Ser 1.69 (*)    GFR, Estimated 31 (*)    All other components within normal limits  CBC - Abnormal; Notable for the following components:   RDW 18.3 (*)    nRBC 0.5 (*)    All other components within normal limits  TROPONIN I (HIGH SENSITIVITY)  TROPONIN I (HIGH SENSITIVITY)    EKG EKG Interpretation Date/Time:  Tuesday August 02 2023 13:54:18 EDT Ventricular Rate:  60 PR Interval:    QRS Duration:  174 QT Interval:  502 QTC Calculation: 502 R Axis:   249  Text Interpretation: ATRIAL PACED RHYTHM Confirmed by Gerhard Munch [phone]) on 08/02/2023 4:16:16 PM  Radiology DG Chest 2 View  Result Date: 08/02/2023 CLINICAL DATA:  Chest pain. EXAM: CHEST - 2 VIEW COMPARISON:  06/05/2022 FINDINGS: Right-sided pacemaker in place. Cardiomegaly is stable. Aortic atherosclerosis and tortuosity. Vascular congestion without pulmonary edema. No focal airspace disease. No pleural effusion or pneumothorax. No acute osseous findings. IMPRESSION: Cardiomegaly with vascular congestion. Electronically Signed   By: Narda Rutherford M.D.   On: 08/02/2023 15:53    Procedures Procedures    Medications Ordered in ED Medications - No data to display  ED Course/ Medical Decision Making/ A&P                                 Medical Decision Making Obese  elderly female with multiple medical issues including pacemaker dependency, A-fib presents with chest pain, nausea, vomiting.  Broad differential including ACS versus pancreatitis or other acute phenomena considered. Absent fever, dyspnea, hypoxia, lower suspicion for pulmonary etiology.  Cardiac 60 paced abnormal Pulse ox 98% room air normal   Amount and/or Complexity of Data Reviewed Independent Historian: EMS External Data Reviewed: notes. Labs: ordered. Decision-making details documented in ED Course. Radiology: ordered and independent interpretation performed. Decision-making details documented in ED Course. ECG/medicine tests: ordered and independent interpretation performed. Decision-making details documented in ED Course.  Risk Decision regarding hospitalization. Diagnosis or treatment significantly limited by social  determinants of health.   6:18 PM On repeat exam patient is awake, alert, now accompanied by a different family member.  I discussed the patient's case with her cardiologist.  I reviewed the patient's pacemaker interrogation, discussed with cardiologist as well.  2 troponins are normal, EKG is nonischemic, pacemaker is working appropriately, with episode of A-fib prolonged yesterday.  No evidence for fluid overload status, no new oxygen requirement, no increased respiratory effort, no hemodynamic instability, reassuring for low suspicion of concurrent infection.  Patient has a scheduled follow-up attending tomorrow which is in 16 hours from now.        Final Clinical Impression(s) / ED Diagnoses Final diagnoses:  Atypical chest pain    Rx / DC Orders ED Discharge Orders     None         Gerhard Munch, MD 08/02/23 1818

## 2023-10-17 ENCOUNTER — Telehealth: Payer: Self-pay | Admitting: Cardiovascular Disease

## 2023-10-17 NOTE — Telephone Encounter (Signed)
Dr. Algie Coffer is requesting a callback regarding him stating Dr. Royann Shivers was the one that placed the pt's pacemaker and now it's time to replace the battery so he would like to discuss it. Please advise

## 2023-10-17 NOTE — Telephone Encounter (Signed)
I would be happy to do it but it looks like her pacemaker has been follwoed by Dr. Rudolpho Sevin in [facility]. Please remind me to call Dr. Algie Coffer tomorrow.

## 2023-10-25 NOTE — Telephone Encounter (Signed)
Late entry- 10/21/23- Dr Royann Shivers called Dr Algie Coffer, but no answer

## 2023-11-02 ENCOUNTER — Telehealth: Payer: Self-pay | Admitting: Cardiovascular Disease

## 2023-11-02 NOTE — Telephone Encounter (Signed)
 Dr. Algie Coffer called and wanted Dr. Royann Shivers to know that the battery in the patient pacemaker needs to be changed.

## 2023-11-09 ENCOUNTER — Encounter: Payer: Self-pay | Admitting: Cardiovascular Disease

## 2023-11-09 ENCOUNTER — Ambulatory Visit: Payer: 59 | Attending: Cardiovascular Disease | Admitting: Cardiovascular Disease

## 2023-11-09 VITALS — BP 118/66 | HR 61 | Ht 66.0 in

## 2023-11-09 DIAGNOSIS — I10 Essential (primary) hypertension: Secondary | ICD-10-CM | POA: Diagnosis not present

## 2023-11-09 DIAGNOSIS — Z01812 Encounter for preprocedural laboratory examination: Secondary | ICD-10-CM | POA: Diagnosis not present

## 2023-11-09 DIAGNOSIS — I495 Sick sinus syndrome: Secondary | ICD-10-CM

## 2023-11-09 DIAGNOSIS — I480 Paroxysmal atrial fibrillation: Secondary | ICD-10-CM

## 2023-11-09 DIAGNOSIS — N183 Chronic kidney disease, stage 3 unspecified: Secondary | ICD-10-CM

## 2023-11-09 DIAGNOSIS — E1122 Type 2 diabetes mellitus with diabetic chronic kidney disease: Secondary | ICD-10-CM

## 2023-11-09 DIAGNOSIS — Z45010 Encounter for checking and testing of cardiac pacemaker pulse generator [battery]: Secondary | ICD-10-CM | POA: Diagnosis not present

## 2023-11-09 DIAGNOSIS — I441 Atrioventricular block, second degree: Secondary | ICD-10-CM

## 2023-11-09 LAB — CBC

## 2023-11-09 NOTE — Patient Instructions (Addendum)
 Implantable Device Instructions    [name]  11/09/2023  You are scheduled for a PPM generator change on Thursday, January 23 with Dr. Karyl Paget Croitoru.  1. Pre procedure Lab testing: CBC, BMP today  2. Please arrive at the [facility] (Main Entrance A) at [facility]: [address] [place], [place] [address] at 12:30 PM (This time is 1 hour(s) before your procedure to ensure your preparation).   Free valet parking service is available. You will check in at ADMITTING. The support person will be asked to wait in the waiting room.  It is OK to have someone drop you off and come back when you are ready to be discharged.          Special note: Every effort is made to have your procedure done on time. Please understand that emergencies sometimes delay  scheduled procedures.  3.  No eating or drinking after midnight prior to procedure.     4.  Medication instructions:  On the morning of your procedure hold your Eliquis  (Apixaban ) for 1 day(s) prior to your procedure. Your last dose will be Tuesday, January 21, PM dose. Also hold Farxiga for 3 days prior to your procedure. Your last dose will be on Sunday January 19th.       !!IF ANY NEW MEDICATIONS ARE STARTED AFTER TODAY, PLEASE NOTIFY YOUR PROVIDER AS SOON AS POSSIBLE!!  FYI: Medications such as Semaglutide (Ozempic, [place]), Tirzepatide (Mounjaro, Zepbound), Dulaglutide (Trulicity), etc ("GLP1 agonists") AND Canagliflozin (Invokana), Dapagliflozin (Farxiga), Empagliflozin (Jardiance), Ertugliflozin (Steglatro), Bexagliflozin [employer]) or any combination with one of these drugs such as Invokamet (Canagliflozin/Metformin), Synjardy (Empagliflozin/Metformin), etc ("SGLT2 inhibitors") must be held around the time of a procedure. This is not a comprehensive list of all of these drugs. Please review all of your medications and talk to your provider if you take any one of these. If you are not sure, ask your provider.   5.   The night before your procedure and the morning of your procedure scrub your neck/chest with CHG surgical scrub.  See instruction letter.  6. Plan to go home the same day, you will only stay overnight if medically necessary. 7. You MUST have a responsible adult to drive you home. 8. An adult MUST be with you the first 24 hours after you arrive home. 9. Bring a current list of your medications, and the last time and date medication taken. 10. Bring ID and current insurance cards. 11. Please wear clothes that are easy to get on and off and wear slip-on shoes.    You will follow up with the [facility] Device clinic 10-14 days after your procedure.  You will follow up with Dr. Karyl Paget Croitoru 91 days after your procedure.  These appointments will be made for you.   * If you have ANY questions after you get home, please call the office at [phone] or send a MyChart message.  FYI: For your safety, and to allow us  to monitor your vital signs accurately during the surgery/procedure we request that if you have artificial nails, gel coating, SNS etc. Please have those removed prior to your surgery/procedure. Not having the nail coverings /polish removed may result in cancellation or delay of your surgery/procedure.    [place] - Preparing For Surgery    Before surgery, you can play an important role. Because skin is not sterile, your skin needs to be as free of germs as possible. You can reduce the number  of germs on your skin by washing with CHG (chlorahexidine gluconate) Soap before surgery.  CHG is an antiseptic cleaner which kills germs and bonds with the skin to continue killing germs even after washing.  Please do not use if you have an allergy to CHG or antibacterial soaps.  If your skin becomes reddened/irritated stop using the CHG.   Do not shave (including legs and underarms) for at least 48 hours prior to first CHG shower.  It is OK to shave your face.  Please follow these instructions  carefully:  1.  Shower the night before surgery and the morning of surgery with CHG.  2.  If you choose to wash your hair, wash your hair first as usual with your normal shampoo.  3.  After you shampoo, rinse your hair and body thoroughly to remove the shampoo.  4.  Use CHG as you would any other liquid soap.  You can apply CHG directly to the skin and wash gently with a clean washcloth. 5.  Apply the CHG Soap to your body ONLY FROM THE NECK DOWN.  Do not use on open wounds or open sores.  Avoid contact with your eyes, ears, mouth and genitals (private parts).  Wash genitals (private parts) with your normal soap.  6.  Wash thoroughly, paying special attention to the area where your surgery will be performed.  7.  Thoroughly rinse your body with warm water  from the neck down.   8.  DO NOT shower/wash with your normal soap after using and rinsing off the CHG soap.  9.  Pat yourself dry with a clean towel.           10.  Wear clean pajamas.           11.  Place clean sheets on your bed the night of your first shower and do not sleep with pets.  Day of Surgery: Do not apply any deodorants/lotions.  Please wear clean clothes to the hospital/surgery center.

## 2023-11-09 NOTE — Progress Notes (Signed)
 Cardiology Office Note:  .   Date:  11/09/2023  ID:  [name], DOB [birth year], MRN [id number] PCP: Elinor Dodge, MD  [facility] Providers Cardiologist:  None    History of Present Illness: [name] Kitchen   [name] is a 83 y.o. 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 Algie Coffer since her pacemaker has reached elective replacement indicator 10/31/2023.  I have not seen her since the generator change in 2015.  The device has been monitored remotely in [facility].  The most recent remote transmission documented in care everywhere from 08/24/2023 shows 26% atrial pacing, 1% ventricular pacing, episodes of paroxysmal atrial fibrillation lasting up to 19 hours (historically prevalence of atrial fibrillation approximately 5% ). She is on amiodarone and Eliquis.  The current pacemaker generator implanted in 2015 is a Forensic psychologist.  The leads were implanted in 2005 and are not MRI conditional (right atrial lead [facility] [id number], right ventricular lead [employer] on [id number]).  Pacemaker interrogation today confirms that she reached ERI on January 6.  All lead parameters are in normal and stable range.  She has 24% atrial pacing and less than 1% ventricular pacing.  There have been 60 episodes of mode switch in the last roughly 8 months, but the overall burden of atrial fibrillation is less than 1%.  Recently, the longest episode of true arrhythmia was an episode of atrial  tachycardia lasting for 1.5 minutes on December 25.  She has not had sustained episodes of atrial arrhythmias since late October.  Rate control during mode switch is actually generally excellent, but she does have episodes of high ventricular rates during what appears to be atrial tachycardia with 1: 1 AV conduction.  She  ROS:   Studies Reviewed: .        Cardiac catheterization 07/18/2020 Mid LAD lesion is 30% stenosed. Ost LAD to Prox LAD lesion is 30% stenosed.  Echocardiogram 06/06/2022   1. Left ventricular ejection fraction, by estimation, is 55 to 60%. The  left ventricle has normal function. The left ventricle has no regional  wall motion abnormalities. There is moderate concentric left ventricular  hypertrophy. Left ventricular  diastolic parameters are indeterminate.   2. Right ventricular systolic function is low normal. The right  ventricular size is normal.   3. Left atrial size was moderately dilated.   4. Right atrial size was mildly dilated.   5. The mitral valve is degenerative. Mild mitral valve regurgitation.   6. The aortic valve is tricuspid. Aortic valve regurgitation is not  visualized.   7. There is mild dilatation of the ascending aorta, measuring 42 mm.  There is mild (Grade II) atheroma plaque involving the aortic root and  ascending aorta.   8. The inferior vena cava is normal in size with greater than 50%  respiratory variability, suggesting right atrial pressure of 3 mmHg.   Conclusion(s)/Recommendation(s): Findings consistent with hypertrophic  cardiomyopathy.    EKG Interpretation Date/Time:    Ventricular Rate:    PR Interval:    QRS Duration:  QT Interval:    QTC Calculation:   R Axis:      Text Interpretation:          Risk Assessment/Calculations:     CHA2DS2-VASc Score = 9   This indicates a 12.2% annual risk of stroke. The patient's score is based upon: CHF History: 1 HTN History: 1 Diabetes History: 1 Stroke History:  2 Vascular Disease History: 1 Age Score: 2 Gender Score: 1             Physical Exam:   VS:  BP 118/66 (BP Location: Right Arm, Patient Position: Sitting, Cuff Size: Large)   Pulse 61   Ht 5\' 6"  (1.676 m)   SpO2 93%   BMI 54.23 kg/m    Wt Readings from Last 3 Encounters:  08/02/23 (!) 336 lb (152.4 kg)  02/01/23 (!) 339 lb (153.8 kg)  08/06/22 (!) 337 lb (152.9 kg)    GEN: Morbidly obese, well nourished, well developed in no acute distress.  Healthy right subclavian pacemaker site. NECK: No JVD; No carotid bruits CARDIAC: Normal S1, paradoxically split S2, RRR, no murmurs, rubs, gallops RESPIRATORY:  Clear to auscultation without rales, wheezing or rhonchi  ABDOMEN: Soft, non-tender, non-distended EXTREMITIES:  No edema; No deformity   ASSESSMENT AND PLAN: .   Pacemaker at elective replacement indicator: Although there is a history of second-degree AV block, she is not pacemaker dependent and actually only requires infrequent atrial pacing and virtually never requires ventricular pacing.  She does have a very broad left bundle branch block and long AV conduction time.  All lead parameters remain excellent.  Discussed the procedure of generator change out and the potential risks and complications.  Although her CHA2DS2-VASc score is high her risk of bleeding complications is also very high due to her body habitus.  Preferable to briefly discontinue her anticoagulant before surgery. Also problem wise to use a antibiotic Aigis pouch.  Although she will receive an MRI conditional device, the leads are not MRI compatible. SSS: Only 24% atrial pacing.  At this point introducing rate response does not appear to be necessary. Morbid obesity/DM 2: Increase the risk of potential surgical complications.       Dispo: Scheduled for pacemaker generator change out 11/16/2022.  Hold Eliquis for 3 doses before the procedure.  Signed, Thurmon Fair, MD

## 2023-11-09 NOTE — H&P (View-Only) (Signed)
Cardiology Office Note:  .   Date:  11/09/2023  ID:  [name], DOB [birth year], MRN [id number] PCP: Darlene Dodge, MD  [facility] Providers Cardiologist:  None    History of Present Illness: [name] Kitchen   [name] is a 83 y.o. 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 Algie Coffer since her pacemaker has reached elective replacement indicator 10/31/2023.  I have not seen her since the generator change in 2015.  The device has been monitored remotely in [facility].  The most recent remote transmission documented in care everywhere from 08/24/2023 shows 26% atrial pacing, 1% ventricular pacing, episodes of paroxysmal atrial fibrillation lasting up to 19 hours (historically prevalence of atrial fibrillation approximately 5% ). She is on amiodarone and Eliquis.  The current pacemaker generator implanted in 2015 is a Fore[employer] psychologist.  The leads were implanted in 2005 and are not MRI conditional (right atrial lead [facility] [id number], right ventricular lead Saint Jude on 1336T-52).  Pacemaker interrogation today confirms that she reached ERI on January 6.  All lead parameters are in normal and stable range.  She has 24% atrial pacing and less than 1% ventricular pacing.  There have been 60 episodes of mode switch in the last roughly 8 months, but the overall burden of atrial fibrillation is less than 1%.  Recently, the longest episode of true arrhythmia was an episode of atrial  tachycardia lasting for 1.5 minutes on December 25.  She has not had sustained episodes of atrial arrhythmias since late October.  Rate control during mode switch is actually generally excellent, but she does have episodes of high ventricular rates during what appears to be atrial tachycardia with 1: 1 AV conduction.  She  ROS:   Studies Reviewed: .        Cardiac catheterization 07/18/2020 Mid LAD lesion is 30% stenosed. Ost LAD to Prox LAD lesion is 30% stenosed.  Echocardiogram 06/06/2022   1. Left ventricular ejection fraction, by estimation, is 55 to 60%. The  left ventricle has normal function. The left ventricle has no regional  wall motion abnormalities. There is moderate concentric left ventricular  hypertrophy. Left ventricular  diastolic parameters are indeterminate.   2. Right ventricular systolic function is low normal. The right  ventricular size is normal.   3. Left atrial size was moderately dilated.   4. Right atrial size was mildly dilated.   5. The mitral valve is degenerative. Mild mitral valve regurgitation.   6. The aortic valve is tricuspid. Aortic valve regurgitation is not  visualized.   7. There is mild dilatation of the ascending aorta, measuring 42 mm.  There is mild (Grade II) atheroma plaque involving the aortic root and  ascending aorta.   8. The inferior vena cava is normal in size with greater than 50%  respiratory variability, suggesting right atrial pressure of 3 mmHg.   Conclusion(s)/Recommendation(s): Findings consistent with hypertrophic  cardiomyopathy.    EKG Interpretation Date/Time:    Ventricular Rate:    PR Interval:    QRS Duration:  QT Interval:    QTC Calculation:   R Axis:      Text Interpretation:          Risk Assessment/Calculations:     CHA2DS2-VASc Score = 9   This indicates a 12.2% annual risk of stroke. The patient's score is based upon: CHF History: 1 HTN History: 1 Diabetes History: 1 Stroke History:  2 Vascular Disease History: 1 Age Score: 2 Gender Score: 1             Physical Exam:   VS:  BP 118/66 (BP Location: Right Arm, Patient Position: Sitting, Cuff Size: Large)   Pulse 61   Ht 5\' 6"  (1.676 m)   SpO2 93%   BMI 54.23 kg/m    Wt Readings from Last 3 Encounters:  08/02/23 (!) 336 lb (152.4 kg)  02/01/23 (!) 339 lb (153.8 kg)  08/06/22 (!) 337 lb (152.9 kg)    GEN: Morbidly obese, well nourished, well developed in no acute distress.  Healthy right subclavian pacemaker site. NECK: No JVD; No carotid bruits CARDIAC: Normal S1, paradoxically split S2, RRR, no murmurs, rubs, gallops RESPIRATORY:  Clear to auscultation without rales, wheezing or rhonchi  ABDOMEN: Soft, non-tender, non-distended EXTREMITIES:  No edema; No deformity   ASSESSMENT AND PLAN: .   Pacemaker at elective replacement indicator: Although there is a history of second-degree AV block, she is not pacemaker dependent and actually only requires infrequent atrial pacing and virtually never requires ventricular pacing.  She does have a very broad left bundle branch block and long AV conduction time.  All lead parameters remain excellent.  Discussed the procedure of generator change out and the potential risks and complications.  Although her CHA2DS2-VASc score is high her risk of bleeding complications is also very high due to her body habitus.  Preferable to briefly discontinue her anticoagulant before surgery. Also problem wise to use a antibiotic Aigis pouch.  Although she will receive an MRI conditional device, the leads are not MRI compatible. SSS: Only 24% atrial pacing.  At this point introducing rate response does not appear to be necessary. Morbid obesity/DM 2: Increase the risk of potential surgical complications.       Dispo: Scheduled for pacemaker generator change out 11/16/2022.  Hold Eliquis for 3 doses before the procedure.  Signed, Thurmon Fair, MD

## 2023-11-10 LAB — CBC
Hematocrit: 45.4 % (ref 34.0–46.6)
Hemoglobin: 14.1 g/dL (ref 11.1–15.9)
MCH: 30.7 pg (ref 26.6–33.0)
MCHC: 31.1 g/dL — ABNORMAL LOW (ref 31.5–35.7)
MCV: 99 fL — ABNORMAL HIGH (ref 79–97)
Platelets: 178 10*3/uL (ref 150–450)
RBC: 4.6 x10E6/uL (ref 3.77–5.28)
RDW: 13.7 % (ref 11.7–15.4)
WBC: 5 10*3/uL (ref 3.4–10.8)

## 2023-11-10 LAB — BASIC METABOLIC PANEL
BUN: 19 mg/dL (ref 8–27)
Creatinine, Ser: 1.36 mg/dL — ABNORMAL HIGH (ref 0.57–1.00)
Potassium: 4.6 mmol/L (ref 3.5–5.2)
eGFR: 39 mL/min/{1.73_m2} — ABNORMAL LOW (ref >59)

## 2023-11-10 LAB — BASIC METABOLIC PANEL WITH GFR
BUN/Creatinine Ratio: 14 (ref 12–28)
CO2: 24 mmol/L (ref 20–29)
Calcium: 9.4 mg/dL (ref 8.7–10.3)
Chloride: 105 mmol/L (ref 96–106)
Glucose: 93 mg/dL (ref 70–99)
Sodium: 143 mmol/L (ref 134–144)

## 2023-11-14 ENCOUNTER — Encounter: Payer: Self-pay | Admitting: Cardiovascular Disease

## 2023-11-17 ENCOUNTER — Ambulatory Visit (HOSPITAL_COMMUNITY)
Admission: RE | Admit: 2023-11-17 | Discharge: 2023-11-17 | Disposition: A | Discharge status: Home / Self Care | Payer: 59 | Attending: Cardiovascular Disease | Admitting: Cardiovascular Disease

## 2023-11-17 ENCOUNTER — Encounter (HOSPITAL_COMMUNITY)
Admission: RE | Disposition: A | Discharge status: Home / Self Care | Payer: Self-pay | Source: Home / Self Care | Attending: Cardiovascular Disease

## 2023-11-17 ENCOUNTER — Encounter (HOSPITAL_COMMUNITY): Payer: Self-pay | Admitting: Cardiovascular Disease

## 2023-11-17 ENCOUNTER — Other Ambulatory Visit: Payer: Self-pay

## 2023-11-17 DIAGNOSIS — Z7901 Long term (current) use of anticoagulants: Secondary | ICD-10-CM | POA: Diagnosis not present

## 2023-11-17 DIAGNOSIS — E785 Hyperlipidemia, unspecified: Secondary | ICD-10-CM | POA: Diagnosis not present

## 2023-11-17 DIAGNOSIS — I447 Left bundle-branch block, unspecified: Secondary | ICD-10-CM | POA: Diagnosis not present

## 2023-11-17 DIAGNOSIS — I2510 Atherosclerotic heart disease of native coronary artery without angina pectoris: Secondary | ICD-10-CM | POA: Insufficient documentation

## 2023-11-17 DIAGNOSIS — I480 Paroxysmal atrial fibrillation: Secondary | ICD-10-CM | POA: Diagnosis not present

## 2023-11-17 DIAGNOSIS — Z45010 Encounter for checking and testing of cardiac pacemaker pulse generator [battery]: Secondary | ICD-10-CM | POA: Diagnosis present

## 2023-11-17 DIAGNOSIS — Z8673 Personal history of transient ischemic attack (TIA), and cerebral infarction without residual deficits: Secondary | ICD-10-CM | POA: Diagnosis not present

## 2023-11-17 DIAGNOSIS — E1122 Type 2 diabetes mellitus with diabetic chronic kidney disease: Secondary | ICD-10-CM | POA: Insufficient documentation

## 2023-11-17 DIAGNOSIS — I441 Atrioventricular block, second degree: Secondary | ICD-10-CM | POA: Insufficient documentation

## 2023-11-17 DIAGNOSIS — I129 Hypertensive chronic kidney disease with stage 1 through stage 4 chronic kidney disease, or unspecified chronic kidney disease: Secondary | ICD-10-CM | POA: Diagnosis not present

## 2023-11-17 DIAGNOSIS — I495 Sick sinus syndrome: Secondary | ICD-10-CM | POA: Insufficient documentation

## 2023-11-17 DIAGNOSIS — Z6841 Body Mass Index (BMI) 40.0 and over, adult: Secondary | ICD-10-CM | POA: Diagnosis not present

## 2023-11-17 DIAGNOSIS — Z79899 Other long term (current) drug therapy: Secondary | ICD-10-CM | POA: Diagnosis not present

## 2023-11-17 DIAGNOSIS — G4733 Obstructive sleep apnea (adult) (pediatric): Secondary | ICD-10-CM | POA: Insufficient documentation

## 2023-11-17 DIAGNOSIS — N1832 Chronic kidney disease, stage 3b: Secondary | ICD-10-CM | POA: Insufficient documentation

## 2023-11-17 HISTORY — PX: PPM GENERATOR CHANGEOUT: EP1233

## 2023-11-17 SURGERY — PPM GENERATOR CHANGEOUT

## 2023-11-17 MED ORDER — FENTANYL CITRATE (PF) 100 MCG/2ML IJ SOLN
INTRAMUSCULAR | Status: DC | PRN
  Administered 2023-11-17 (×2): 25 ug via INTRAVENOUS

## 2023-11-17 MED ORDER — MIDAZOLAM HCL 5 MG/5ML IJ SOLN
INTRAMUSCULAR | Status: DC | PRN
  Administered 2023-11-17 (×2): 1 mg via INTRAVENOUS

## 2023-11-17 MED ORDER — MIDAZOLAM HCL 5 MG/5ML IJ SOLN
INTRAMUSCULAR | Status: AC
  Filled 2023-11-17: qty 5

## 2023-11-17 MED ORDER — SODIUM CHLORIDE 0.9 % IV SOLN
INTRAVENOUS | Status: AC
  Administered 2023-11-17: 80 mg
  Filled 2023-11-17: qty 2

## 2023-11-17 MED ORDER — APIXABAN 5 MG PO TABS: 5.0000 mg | ORAL_TABLET | Freq: Two times a day (BID) | ORAL | Status: AC

## 2023-11-17 MED ORDER — LIDOCAINE HCL (PF) 1 % IJ SOLN
INTRAMUSCULAR | Status: AC
  Filled 2023-11-17: qty 60

## 2023-11-17 MED ORDER — POVIDONE-IODINE 10 % EX SWAB: 2.0000 | Freq: Once | CUTANEOUS | Status: DC

## 2023-11-17 MED ORDER — SODIUM CHLORIDE 0.9 % IV SOLN: 80.0000 mg | INTRAVENOUS | Status: AC

## 2023-11-17 MED ORDER — SODIUM CHLORIDE 0.9% FLUSH: 3.0000 mL | Freq: Two times a day (BID) | INTRAVENOUS | Status: DC

## 2023-11-17 MED ORDER — LIDOCAINE HCL (PF) 1 % IJ SOLN
INTRAMUSCULAR | Status: DC | PRN
  Administered 2023-11-17: 50 mL

## 2023-11-17 MED ORDER — SODIUM CHLORIDE 0.9 % IV SOLN: 250.0000 mL | INTRAVENOUS | Status: DC

## 2023-11-17 MED ORDER — ONDANSETRON HCL 4 MG/2ML IJ SOLN: 4.0000 mg | Freq: Four times a day (QID) | INTRAMUSCULAR | Status: DC | PRN

## 2023-11-17 MED ORDER — ACETAMINOPHEN 325 MG PO TABS: 325.0000 mg | ORAL_TABLET | ORAL | Status: DC | PRN

## 2023-11-17 MED ORDER — FENTANYL CITRATE (PF) 100 MCG/2ML IJ SOLN
INTRAMUSCULAR | Status: AC
  Filled 2023-11-17: qty 2

## 2023-11-17 MED ORDER — SODIUM CHLORIDE 0.9% FLUSH: 3.0000 mL | INTRAVENOUS | Status: DC | PRN

## 2023-11-17 MED ORDER — CHLORHEXIDINE GLUCONATE 4 % EX SOLN
4.0000 | Freq: Once | CUTANEOUS | Status: DC
  Filled 2023-11-17: qty 60

## 2023-11-17 MED ORDER — SODIUM CHLORIDE 0.9 % IV SOLN
INTRAVENOUS | Status: DC | PRN
Start: 2023-11-17 — End: 2023-11-17

## 2023-11-17 MED ORDER — CEFAZOLIN SODIUM-DEXTROSE 3-4 GM/150ML-% IV SOLN
3.0000 g | INTRAVENOUS | Status: AC
  Administered 2023-11-17: 3 g via INTRAVENOUS
  Filled 2023-11-17: qty 150

## 2023-11-17 SURGICAL SUPPLY — 6 items
CABLE SURGICAL S-101-97-12 (CABLE) ×1 IMPLANT
PACEMAKER ASSURITY DR-RF (Pacemaker) IMPLANT
PAD DEFIB RADIO PHYSIO CONN (PAD) ×1 IMPLANT
POUCH AIGIS-R ANTIBACT PPM (Mesh General) ×1 IMPLANT
POUCH AIGIS-R ANTIBACT PPM MED (Mesh General) IMPLANT
TRAY PACEMAKER INSERTION (PACKS) ×1 IMPLANT

## 2023-11-17 NOTE — Discharge Instructions (Signed)

## 2023-11-17 NOTE — Interval H&P Note (Signed)
History and Physical Interval Note:  11/17/2023 12:59 PM  [name]  has presented today for surgery, with the diagnosis of eri.  The various methods of treatment have been discussed with the patient and family. After consideration of risks, benefits and other options for treatment, the patient has consented to  Procedure(s): PPM GENERATOR CHANGEOUT (N/A) as a surgical intervention.  The patient's history has been reviewed, patient examined, no change in status, stable for surgery.  I have reviewed the patient's chart and labs.  Questions were answered to the patient's satisfaction.     Elizabeth Paulsen

## 2023-11-17 NOTE — Op Note (Signed)
Procedure report  Procedure performed:  1. Dual chamber pacemaker generator changeout  2. Sedation  3. TyRx pouch  Reason for procedure:  1. Device generator at elective replacement interval  2. Sinus node dysfunction 3. Second degree AV block  Procedure performed by:  Thurmon Fair, MD   Complications:  None  Estimated blood loss:  <5 mL  Medications administered during procedure:  Ancef 3 g intravenously,  lidocaine 1% 30 mL locally, fentanyl 50 mcg intravenously, Versed 2 mg intravenously  During this procedure the patient is administered a total of Versed 2 mg and Fentanyl 50 mcg to achieve and maintain moderate conscious sedation.  The patient's heart rate, blood pressure, and oxygen saturation are monitored continuously during the procedure. The period of conscious sedation is 26 minutes, of which I was present face-to-face 100% of this time. Kalman Jewels, RN is an independent, trained observer who assisted in the monitoring of the patient's level of consciousness.    Device details:   New Generator [employer] Assurity MRI model number 2272, serial number [id number] Right atrial lead (chronic) St. Jude IsoflexS [phone],  serial number [id number] (implanted 12/12/2003) Right ventricular lead (chronic)  St. Jude Passive PlusDX [id number], serial number [id number] (implanted 12/12/2003)  Explanted generator St. Jude Assurity DR,  model number 2240, serial number  [id number] (implanted 03/28/2014)  Procedure details:  After the risks and benefits of the procedure were discussed the patient provided informed consent. She was brought to the cardiac catheter lab in the fasting state. The patient was prepped and draped in usual sterile fashion. Local anesthesia with 1% lidocaine was administered to to the left infraclavicular area. A 5-6cm horizontal incision was made parallel with and 2-3 cm caudal to the right clavicle, just caudal to the area of an old scar.  Using minimal electrocautery and mostly  sharp and blunt dissection the prepectoral pocket was opened carefully to avoid injury to the loops of chronic leads. Extensive dissection was not necessary. The device was explanted. The pocket was carefully inspected for hemostasis and flushed with copious amounts of antibiotic solution.  The leads were disconnected from the old generator and connected to the new generator, with appropriate pacing noted. Telemetry testing was of the chronic leads showed unchanged parameters.   The entire system was then carefully inserted ina an Aigis/TyRx pouch and placed in the pocket with care taken that the leads and device assumed a comfortable position without pressure on the incision. The leads were located deep to the generator. The pocket was then closed in layers using 2 layers of 2-0 Vicryl, one leayer of 3-0 Vicryl and cutaneous steristrips after which a sterile dressing was applied.   At the end of the procedure the following lead parameters were encountered:   Right atrial lead sensed P waves 3.0 mV, impedance 550 ohms, threshold 0.625 at 0.5 ms pulse width.  Right ventricular lead sensed R waves  5.2 mV, impedance 490 ohms, threshold 1.25 at 0.5 ms pulse width.   Thurmon Fair, MD, [facility] [phone] office [phone] pager  Cc: Orpah Cobb, MD

## 2023-12-01 ENCOUNTER — Ambulatory Visit: Payer: 59 | Attending: Internal Medicine

## 2023-12-01 DIAGNOSIS — I495 Sick sinus syndrome: Secondary | ICD-10-CM

## 2023-12-01 NOTE — Patient Instructions (Signed)

## 2023-12-01 NOTE — Progress Notes (Signed)
 Normal Pacemaker wound check. Wound well healed. Thresholds, sensing, and impedances consistent with previous measurements. No episodes.  Pt enrolled in remote follow-up.

## 2024-02-20 ENCOUNTER — Ambulatory Visit (INDEPENDENT_AMBULATORY_CARE_PROVIDER_SITE_OTHER): Payer: 59

## 2024-02-20 DIAGNOSIS — I495 Sick sinus syndrome: Secondary | ICD-10-CM

## 2024-02-20 LAB — CUP PACEART REMOTE DEVICE CHECK
Battery Remaining Longevity: 120 mo
Battery Remaining Percentage: 95.5 %
Battery Voltage: 3.01 V
Brady Statistic AP VP Percent: 12 %
Brady Statistic AP VS Percent: 56 %
Brady Statistic AS VP Percent: 1 %
Brady Statistic AS VS Percent: 32 %
Brady Statistic RA Percent Paced: 67 %
Brady Statistic RV Percent Paced: 12 %
Date Time Interrogation Session: 20250428021546
Implantable Lead Connection Status: 753985
Implantable Lead Connection Status: 753985
Implantable Lead Implant Date: 20050217
Implantable Lead Implant Date: 20050217
Implantable Lead Location: 753859
Implantable Lead Location: 753860
Implantable Lead Serial Number: 2877
Implantable Pulse Generator Implant Date: 20250123
Lead Channel Impedance Value: 450 Ohm
Lead Channel Impedance Value: 540 Ohm
Lead Channel Pacing Threshold Amplitude: 0.75 V
Lead Channel Pacing Threshold Amplitude: 1.125 V
Lead Channel Pacing Threshold Pulse Width: 0.5 ms
Lead Channel Pacing Threshold Pulse Width: 0.5 ms
Lead Channel Sensing Intrinsic Amplitude: 3 mV
Lead Channel Sensing Intrinsic Amplitude: 4.5 mV
Lead Channel Setting Pacing Amplitude: 1.375
Lead Channel Setting Pacing Amplitude: 1.75 V
Lead Channel Setting Pacing Pulse Width: 0.5 ms
Lead Channel Setting Sensing Sensitivity: 2 mV
Pulse Gen Model: 2272
Pulse Gen Serial Number: 8232753

## 2024-03-01 ENCOUNTER — Encounter: Payer: Self-pay | Admitting: Cardiovascular Disease

## 2024-03-01 ENCOUNTER — Ambulatory Visit: Payer: 59 | Attending: Cardiovascular Disease | Admitting: Cardiovascular Disease

## 2024-03-01 VITALS — BP 116/60 | HR 60 | Ht 66.0 in | Wt 378.6 lb

## 2024-03-01 DIAGNOSIS — I480 Paroxysmal atrial fibrillation: Secondary | ICD-10-CM

## 2024-03-01 DIAGNOSIS — I10 Essential (primary) hypertension: Secondary | ICD-10-CM

## 2024-03-01 DIAGNOSIS — Z950 Presence of cardiac pacemaker: Secondary | ICD-10-CM

## 2024-03-01 DIAGNOSIS — I495 Sick sinus syndrome: Secondary | ICD-10-CM | POA: Diagnosis not present

## 2024-03-01 DIAGNOSIS — D6869 Other thrombophilia: Secondary | ICD-10-CM

## 2024-03-01 DIAGNOSIS — E7800 Pure hypercholesterolemia, unspecified: Secondary | ICD-10-CM

## 2024-03-01 NOTE — Progress Notes (Signed)
 Cardiology Office Note:  .   Date:  03/01/2024  ID:  Virgin Grill, DOB [birth year], MRN [id number] PCP: Sedonia Dad, MD  [facility] Providers Cardiologist:  Sharyn Deforest History of Present Illness: [name]   CALEB KELSH is a 83 y.o. 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  Her primary cardiologist is Dr. Sharyn Deforest.  The pacemaker site has healed well.  Her device is working normally.  Estimated generator longevity is 9.6-10.5 years.  She is not pacemaker dependent but has 68% atrial pacing and 11% ventricular pacing.  Presenting rhythm is atrial paced, ventricular sensed.  She has had 7 brief episodes of atrial mode switch.  Most of these are under 10 seconds in duration but there was a single 44-second episode of atrial tachycardia.  She has rare palpitations.  Unclear whether these coincide with the episodes of atrial tachycardia.  She has not had atrial fibrillation or any episodes of high ventricular rates.  The heart rate histograms are quite blunted and she describes easy fatigue.  Adjusted heart rate response today from 8 to 10 and made the response time more aggressive, from medium to fast.  The current pacemaker generator implanted in January 2025 is a Forensic psychologist.  The leads were implanted in 2005 and are not MRI conditional (right atrial lead [facility] [id number], right ventricular lead Saint Jude on  1336T-52).   Studies Reviewed: .        Cardiac catheterization 07/18/2020 Mid LAD lesion is 30% stenosed. Ost LAD to Prox LAD lesion is 30% stenosed.  Echocardiogram 06/06/2022   1. Left ventricular ejection fraction, by estimation, is 55 to 60%. The  left ventricle has normal function. The left ventricle has no regional  wall motion abnormalities. There is moderate concentric left ventricular  hypertrophy. Left ventricular  diastolic parameters are indeterminate.   2. Right ventricular systolic function is low normal. The right  ventricular size is normal.   3. Left atrial size was moderately dilated.   4. Right atrial size was mildly dilated.   5. The mitral valve is degenerative. Mild mitral valve regurgitation.   6. The aortic valve is tricuspid. Aortic valve regurgitation is not  visualized.   7. There is mild dilatation of the ascending aorta, measuring 42 mm.  There is mild (Grade II) atheroma plaque involving the aortic root and  ascending aorta.   8. The inferior vena cava is normal in size with greater than 50%  respiratory variability, suggesting right atrial pressure of 3 mmHg.   Conclusion(s)/Recommendation(s): Findings consistent with hypertrophic  cardiomyopathy.    EKG Interpretation Date/Time:  Thursday Mar 01 2024 11:05:57 EDT Ventricular Rate:  60 PR Interval:  298 QRS Duration:  194 QT Interval:  500 QTC Calculation: 500 R Axis:   -48  Text Interpretation: Atrial-paced rhythm with prolonged AV conduction Left axis deviation Left bundle branch block When compared with ECG of 09-Nov-2023 10:14, Electronic atrial pacemaker has replaced Sinus rhythm Confirmed by Jullien Granquist ([id number]) on 03/02/2024 8:15:53 AM  Risk Assessment/Calculations:     CHA2DS2-VASc Score = 9   This indicates a 12.2% annual risk of stroke. The patient's score is based upon: CHF History: 1 HTN History: 1 Diabetes History: 1 Stroke History: 2 Vascular Disease  History: 1 Age Score: 2 Gender Score: 1             Physical Exam:   VS:  BP 116/60 (BP Location: Left Arm, Patient Position: Sitting)   Pulse 60   Ht 5\' 6"  (1.676 m)   Wt (!) 171.7 kg   SpO2 93%   BMI 61.11 kg/m    Wt Readings from Last 3 Encounters:  03/01/24 (!) 171.7 kg  11/17/23 (!) 147.9 kg  08/02/23 (!) 152.4 kg     General: Alert, oriented x3, no distress, morbidly obese.  Well-healed right subclavian pacemaker site. Head: no evidence of trauma, PERRL, EOMI, no exophtalmos or lid lag, no myxedema, no xanthelasma; normal ears, nose and oropharynx Neck: normal jugular venous pulsations and no hepatojugular reflux; brisk carotid pulses without delay and no carotid bruits Chest: clear to auscultation, no signs of consolidation by percussion or palpation, normal fremitus, symmetrical and full respiratory excursions Cardiovascular: normal position and quality of the apical impulse, regular rhythm, normal first and second heart sounds, no murmurs, rubs or gallops Abdomen: no tenderness or distention, no masses by palpation, no abnormal pulsatility or arterial bruits, normal bowel sounds, no hepatosplenomegaly Extremities: no clubbing, cyanosis or edema; 2+ radial, ulnar and brachial pulses bilaterally; 2+ right femoral, posterior tibial and dorsalis pedis pulses; 2+ left femoral, posterior tibial and dorsalis pedis pulses; no subclavian or femoral bruits Neurological: grossly nonfocal Psych: Normal mood and affect   ASSESSMENT AND PLAN: .   SSS: She is pacing more now than in years past and seems to have rather flat histograms.  We increased the rate response settings today. Pacemaker: Normal device function.  Well-healed site following generator change out 4 months ago.  Enrolled in our device clinic with plan follow-up every 3 months remotely and in office check yearly. AFib: None has been recorded since her generator change out.  She is on anticoagulation with  apixaban . Anticoagulation: No bleeding problems. Morbid obesity/DM 2/HLP: Very well-controlled hemoglobin A1c.  I do not have a recent lipid profile, but labs from 2021 showed an LDL cholesterol that was only 43.  She is taking rosuvastatin . HTN: Excellent control on current medications. OSA: Reports compliance with CPAP.       Dispo: Remote pacemaker balance every 3 months.  Yearly in office pacemaker check.  Follow-up with Dr. Sharyn Deforest for general cardiology care.  Signed, Luana Rumple, MD

## 2024-03-01 NOTE — Patient Instructions (Signed)
 Medication Instructions:  No changes *If you need a refill on your cardiac medications before your next appointment, please call your pharmacy*  Follow-Up: At [facility], you and your health needs are our priority.  As part of our continuing mission to provide you with exceptional heart care, our providers are all part of one team.  This team includes your primary Cardiologist (physician) and Advanced Practice Providers or APPs (Physician Assistants and Nurse Practitioners) who all work together to provide you with the care you need, when you need it.  Your next appointment:   1 year(s)  Provider:   Dr Alvis Ba  We recommend signing up for the patient portal called "MyChart".  Sign up information is provided on this After Visit Summary.  MyChart is used to connect with patients for Virtual Visits (Telemedicine).  Patients are able to view lab/test results, encounter notes, upcoming appointments, etc.  Non-urgent messages can be sent to your provider as well.   To learn more about what you can do with MyChart, go to [email or web address].

## 2024-04-10 NOTE — Progress Notes (Signed)
 Remote pacemaker transmission.

## 2024-05-21 ENCOUNTER — Ambulatory Visit (INDEPENDENT_AMBULATORY_CARE_PROVIDER_SITE_OTHER): Payer: 59

## 2024-05-21 DIAGNOSIS — I495 Sick sinus syndrome: Secondary | ICD-10-CM

## 2024-05-22 LAB — CUP PACEART REMOTE DEVICE CHECK
Battery Remaining Longevity: 119 mo
Battery Remaining Percentage: 95.5 %
Battery Voltage: 3.01 V
Brady Statistic AP VP Percent: 6 %
Brady Statistic AP VS Percent: 59 %
Brady Statistic AS VP Percent: 1 %
Brady Statistic AS VS Percent: 35 %
Brady Statistic RA Percent Paced: 64 %
Brady Statistic RV Percent Paced: 6.5 %
Date Time Interrogation Session: 20250728020014
Implantable Lead Connection Status: 753985
Implantable Lead Connection Status: 753985
Implantable Lead Implant Date: 20050217
Implantable Lead Implant Date: 20050217
Implantable Lead Location: 753859
Implantable Lead Location: 753860
Implantable Lead Serial Number: 2877
Implantable Pulse Generator Implant Date: 20250123
Lead Channel Impedance Value: 450 Ohm
Lead Channel Impedance Value: 530 Ohm
Lead Channel Pacing Threshold Amplitude: 0.75 V
Lead Channel Pacing Threshold Amplitude: 1.125 V
Lead Channel Pacing Threshold Pulse Width: 0.5 ms
Lead Channel Pacing Threshold Pulse Width: 0.5 ms
Lead Channel Sensing Intrinsic Amplitude: 2 mV
Lead Channel Sensing Intrinsic Amplitude: 6 mV
Lead Channel Setting Pacing Amplitude: 1.375
Lead Channel Setting Pacing Amplitude: 1.75 V
Lead Channel Setting Pacing Pulse Width: 0.5 ms
Lead Channel Setting Sensing Sensitivity: 2 mV
Pulse Gen Model: 2272
Pulse Gen Serial Number: 8232753

## 2024-05-29 ENCOUNTER — Ambulatory Visit: Payer: Self-pay | Admitting: Cardiovascular Disease

## 2024-07-26 NOTE — Progress Notes (Signed)
 Remote PPM Transmission

## 2024-08-20 ENCOUNTER — Ambulatory Visit (INDEPENDENT_AMBULATORY_CARE_PROVIDER_SITE_OTHER): Payer: 59

## 2024-08-20 DIAGNOSIS — I495 Sick sinus syndrome: Secondary | ICD-10-CM | POA: Diagnosis not present

## 2024-08-21 LAB — CUP PACEART REMOTE DEVICE CHECK
Battery Remaining Longevity: 115 mo
Battery Remaining Percentage: 95 %
Battery Voltage: 3.01 V
Brady Statistic AP VP Percent: 8.6 %
Brady Statistic AP VS Percent: 59 %
Brady Statistic AS VP Percent: 1 %
Brady Statistic AS VS Percent: 32 %
Brady Statistic RA Percent Paced: 67 %
Brady Statistic RV Percent Paced: 9.4 %
Date Time Interrogation Session: 20251027020019
Implantable Lead Connection Status: 753985
Implantable Lead Connection Status: 753985
Implantable Lead Implant Date: 20050217
Implantable Lead Implant Date: 20050217
Implantable Lead Location: 753859
Implantable Lead Location: 753860
Implantable Lead Serial Number: 2877
Implantable Pulse Generator Implant Date: 20250123
Lead Channel Impedance Value: 450 Ohm
Lead Channel Impedance Value: 530 Ohm
Lead Channel Pacing Threshold Amplitude: 0.875 V
Lead Channel Pacing Threshold Amplitude: 1.375 V
Lead Channel Pacing Threshold Pulse Width: 0.5 ms
Lead Channel Pacing Threshold Pulse Width: 0.5 ms
Lead Channel Sensing Intrinsic Amplitude: 2.1 mV
Lead Channel Sensing Intrinsic Amplitude: 6.5 mV
Lead Channel Setting Pacing Amplitude: 1.625
Lead Channel Setting Pacing Amplitude: 1.875
Lead Channel Setting Pacing Pulse Width: 0.5 ms
Lead Channel Setting Sensing Sensitivity: 2 mV
Pulse Gen Model: 2272
Pulse Gen Serial Number: 8232753

## 2024-08-23 ENCOUNTER — Ambulatory Visit: Payer: Self-pay | Admitting: Cardiovascular Disease

## 2024-08-28 NOTE — Progress Notes (Signed)
 Remote PPM Transmission

## 2024-10-15 NOTE — Progress Notes (Signed)
 [facility]  Referred by: Self, Referral  Chief Complaint: Wound  PMH Includes: - OSA - Heart failure - Venous insufficiency - CKD - Morbid obesity  Initial History 08/02/24:  She is here for evaluation of bilateral lower leg ulcerations. She has a long history of swelling and difficulty with her legs. She reports having veins stripped in the 1970s. She has chronic LE swelling and drainage. The wounds have been open for a few months. No clear cause. She has a hospital bed and can elevate her legs She denies fever or chills. She does not use compression wrap. She has never seen a lymphedema clinic She reports gaining 50 lbs over last year - some of it fluid.   Relevant work up/results:   Interval history/progress: Initial visit - see above  08-16-24 Missed visit last week Original wounds are improved.  Has a few new areas on the leg.  No fevers or chills.   08-23-24 Wounds are doing better.  She tolerated unna boots No fevers or chills.   08-30-24 Doing well  Got ABI yesterday   09-06-24 Wounds are improved. Doing well.   09-13-24 Wounds are improved. Shallow. Less pain.   09-28-24 Continues to make great progress. Only one remaining ulcer on left lateral leg. Some slippage of wraps this past week.   10-04-24 Doing well. Wound is smaller  10-11-24 Wounds are healed. No drainage.    Vitals:   10/15/24 1412  BP: (!) 185/90  Pulse:   Temp:           Exam: Pt is AA in NAD. Oriented  Wound(s) exam: Bilateral legs: Uncontrolled lymphedema of thighs and lower leg. Stage III lymphedema  Both legs are healed. No drainage. No infection.    Wound 08/02/24 Venous Ulcer Leg Left (Active)  Date First Assessed/Time First Assessed: 08/02/24 1021   Pre-Existing Wound: Yes  Primary Wound Type: Venous Ulcer  Location: Leg  Wound Location Orientation: Left    Assessments 08/02/2024 10:45 AM 10/15/2024  2:07 PM  Wound Image       Site Assessment Granulation tissue Dry  Peri-Wound Assessment Dry Clean  Drainage Amount Moderate None  Drainage Description Serosanguineous;Yellow --  Wound Odor None None  Wound Length (cm) 0.5 cm 0.1 cm  Wound Width (cm) 0.5 cm 0.1 cm  Wound Surface Area (cm^2) -- 0.01 cm^2  Wound Depth (cm) 0.1 cm 0.1 cm  Wound Volume (cm^3) 0.013 cm^3 0.001 cm^3  Wound Healing % -- 92  Non-staged Wound Description Full thickness Full thickness     Active Orders  Date Order Priority Status Authorizing Provider  10/04/24 1603 Wound Care Venous Ulcer Left Leg Routine Active Fonda Charlie Morita, MD    - Wound cleansing::    Keep dry    - Dressing change frequency::    Do not change dressing for an entire week    - Edema control::    Unna boot to left lower extremity    - Additional Orders/Instructions::    Follow nutritous diet    - Follow Up Appointment::    Return to clinic in 1 week  09/27/24 2050 Wound Care Venous Ulcer Left Leg Routine Active Lucie Almarie Engels, DO    - Wound cleansing::    Keep dry    - Dressing change frequency::    Do not change dressing until nurse visit    - Edema control::    Unna boot to left lower extremity  09/13/24 1044  Wound Care Venous Ulcer Left Leg Routine Active Fonda Charlie Morita, MD    - Wound cleansing::    Keep dry    - Dressing change frequency::    Do not change dressing for an entire week    - Primary wound dressing::    Other (Specify) (gentin violet)    - Edema control::    Unna boot to left lower extremity    - Additional Orders/Instructions::    Follow nutritous diet    - Follow Up Appointment::    Return to clinic in 1 week     Inactive Orders  Date Order Priority Status Authorizing Provider  09/06/24 [phone] Wound Care Venous Ulcer Left Leg Routine Discontinued Fonda Charlie Morita, MD    - Wound cleansing::    Keep dry    - Dressing change frequency::    Do not change dressing for an entire week    - Edema control::    Unna boot  to left lower extremity    - Additional Orders/Instructions::    Follow nutritous diet    - Follow Up Appointment::    Return to clinic in 1 week  08/30/24 1156 Wound Care Venous Ulcer Left Leg Routine Discontinued Fonda Charlie Morita, MD    - Wound cleansing::    Keep dry    - Dressing change frequency::    Do not change dressing for an entire week    - Edema control::    Unna boots bilaterally    - Additional Orders/Instructions::    Follow nutritous diet    - Follow Up Appointment::    Return to clinic in 1 week  08/23/24 1151 Wound Care Venous Ulcer Left Leg Routine Discontinued Fonda Charlie Morita, MD    - Wound cleansing::    Keep dry    - Dressing change frequency::    Do not change dressing for an entire week    - Edema control::    Unna boots bilaterally    - Additional Orders/Instructions::    Follow nutritous diet    - Follow Up Appointment::    Return to clinic in 1 week  08/16/24 1211 Wound Care Routine Discontinued Fonda Charlie Morita, MD    - Wound cleansing::    Keep dry    - Dressing change frequency::    Do not change dressing for an entire week    - Edema control::    Unna boots bilaterally    - Additional Orders/Instructions::    Follow nutritous diet    - Follow Up Appointment::    Return to clinic in 1 week  08/02/24 1219 Wound Care Routine Discontinued Fonda Charlie Morita, MD    - Wound cleansing::    Keep dry    - Dressing change frequency::    Do not change dressing for an entire week    - Primary wound dressing::    Other (Specify) (gentian violet)    - Edema control::    Unna boots bilaterally    - Additional Orders/Instructions::    Follow nutritous diet    - Follow Up Appointment::    Return to clinic in 1 week  08/02/24 1112 Debridement Venous Ulcer Left;Lateral Leg Routine Completed Fonda Charlie Morita, MD    Assessment Encounter Diagnoses  Name Primary?   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   LLE is healed.   PLAN: Measured for circaids today.  Continue to elevate legs Elevate legs High protein diet  F/u 2-3 weeks sooner if needed      No orders of the defined types were placed in this encounter.   Fonda Charlie Morita 10/15/2024   Medical History[1]          [1] Past Medical History: Diagnosis Date   Acid reflux    Apnea, sleep    Bilateral primary osteoarthritis of knee    Borderline hypercholesterolemia    Cataract    CHF (congestive heart failure)    (CMD)    Chronic kidney disease    Coronary atherosclerosis of native coronary artery    Hypertension    Low back pain    Mixed hyperlipidemia    Obesity, unspecified

## 2024-11-05 ENCOUNTER — Emergency Department (HOSPITAL_COMMUNITY)

## 2024-11-05 ENCOUNTER — Other Ambulatory Visit: Payer: Self-pay

## 2024-11-05 ENCOUNTER — Observation Stay (HOSPITAL_COMMUNITY)
Admission: EM | Admit: 2024-11-05 | Discharge: 2024-11-09 | Disposition: A | Discharge status: Home / Self Care | Attending: Internal Medicine | Admitting: Internal Medicine

## 2024-11-05 DIAGNOSIS — Z7984 Long term (current) use of oral hypoglycemic drugs: Secondary | ICD-10-CM | POA: Insufficient documentation

## 2024-11-05 DIAGNOSIS — I872 Venous insufficiency (chronic) (peripheral): Secondary | ICD-10-CM | POA: Insufficient documentation

## 2024-11-05 DIAGNOSIS — Z79899 Other long term (current) drug therapy: Secondary | ICD-10-CM | POA: Diagnosis not present

## 2024-11-05 DIAGNOSIS — N1832 Chronic kidney disease, stage 3b: Secondary | ICD-10-CM | POA: Diagnosis not present

## 2024-11-05 DIAGNOSIS — R112 Nausea with vomiting, unspecified: Secondary | ICD-10-CM | POA: Diagnosis not present

## 2024-11-05 DIAGNOSIS — Z6841 Body Mass Index (BMI) 40.0 and over, adult: Secondary | ICD-10-CM | POA: Insufficient documentation

## 2024-11-05 DIAGNOSIS — R1013 Epigastric pain: Secondary | ICD-10-CM | POA: Diagnosis not present

## 2024-11-05 DIAGNOSIS — I480 Paroxysmal atrial fibrillation: Secondary | ICD-10-CM | POA: Diagnosis not present

## 2024-11-05 DIAGNOSIS — Z8673 Personal history of transient ischemic attack (TIA), and cerebral infarction without residual deficits: Secondary | ICD-10-CM | POA: Insufficient documentation

## 2024-11-05 DIAGNOSIS — I130 Hypertensive heart and chronic kidney disease with heart failure and stage 1 through stage 4 chronic kidney disease, or unspecified chronic kidney disease: Secondary | ICD-10-CM | POA: Diagnosis not present

## 2024-11-05 DIAGNOSIS — I890 Lymphedema, not elsewhere classified: Secondary | ICD-10-CM | POA: Insufficient documentation

## 2024-11-05 DIAGNOSIS — E1122 Type 2 diabetes mellitus with diabetic chronic kidney disease: Secondary | ICD-10-CM | POA: Insufficient documentation

## 2024-11-05 DIAGNOSIS — R197 Diarrhea, unspecified: Secondary | ICD-10-CM | POA: Diagnosis not present

## 2024-11-05 DIAGNOSIS — Z950 Presence of cardiac pacemaker: Secondary | ICD-10-CM | POA: Insufficient documentation

## 2024-11-05 DIAGNOSIS — Z7901 Long term (current) use of anticoagulants: Secondary | ICD-10-CM | POA: Diagnosis not present

## 2024-11-05 DIAGNOSIS — I2510 Atherosclerotic heart disease of native coronary artery without angina pectoris: Secondary | ICD-10-CM | POA: Insufficient documentation

## 2024-11-05 DIAGNOSIS — I5032 Chronic diastolic (congestive) heart failure: Secondary | ICD-10-CM | POA: Diagnosis not present

## 2024-11-05 DIAGNOSIS — R42 Dizziness and giddiness: Secondary | ICD-10-CM | POA: Insufficient documentation

## 2024-11-05 LAB — I-STAT CHEM 8, ED
BUN: 21 mg/dL (ref 8–23)
Calcium, Ion: 1.23 mmol/L (ref 1.15–1.40)
Chloride: 102 mmol/L (ref 98–111)
Creatinine, Ser: 1.4 mg/dL — ABNORMAL HIGH (ref 0.44–1.00)
Glucose, Bld: 132 mg/dL — ABNORMAL HIGH (ref 70–99)
HCT: 51 % — ABNORMAL HIGH (ref 36.0–46.0)
Hemoglobin: 17.3 g/dL — ABNORMAL HIGH (ref 12.0–15.0)
Potassium: 4.2 mmol/L (ref 3.5–5.1)
Sodium: 142 mmol/L (ref 135–145)
TCO2: 29 mmol/L (ref 22–32)

## 2024-11-05 LAB — CBC WITH DIFFERENTIAL/PLATELET
Abs Immature Granulocytes: 0 K/uL (ref 0.00–0.07)
Basophils Absolute: 0 K/uL (ref 0.0–0.1)
Basophils Relative: 1 %
Eosinophils Absolute: 0.2 K/uL (ref 0.0–0.5)
Eosinophils Relative: 4 %
HCT: 49.2 % — ABNORMAL HIGH (ref 36.0–46.0)
Hemoglobin: 15.7 g/dL — ABNORMAL HIGH (ref 12.0–15.0)
Immature Granulocytes: 0 %
Lymphocytes Relative: 24 %
Lymphs Abs: 0.9 K/uL (ref 0.7–4.0)
MCH: 31.7 pg (ref 26.0–34.0)
MCHC: 31.9 g/dL (ref 30.0–36.0)
MCV: 99.4 fL (ref 80.0–100.0)
Monocytes Absolute: 0.3 K/uL (ref 0.1–1.0)
Monocytes Relative: 8 %
Neutro Abs: 2.3 K/uL (ref 1.7–7.7)
Neutrophils Relative %: 63 %
Platelets: 150 K/uL (ref 150–400)
RBC: 4.95 MIL/uL (ref 3.87–5.11)
RDW: 16.2 % — ABNORMAL HIGH (ref 11.5–15.5)
WBC: 3.6 K/uL — ABNORMAL LOW (ref 4.0–10.5)
nRBC: 0 % (ref 0.0–0.2)

## 2024-11-05 LAB — TROPONIN T, HIGH SENSITIVITY
Troponin T High Sensitivity: <15 ng/L (ref 0–19)
Troponin T High Sensitivity: <15 ng/L (ref 0–19)

## 2024-11-05 LAB — COMPREHENSIVE METABOLIC PANEL WITH GFR
ALT: 5 U/L (ref 0–44)
AST: 19 U/L (ref 15–41)
Albumin: 3.9 g/dL (ref 3.5–5.0)
Alkaline Phosphatase: 84 U/L (ref 38–126)
Anion gap: 8 (ref 5–15)
BUN: 19 mg/dL (ref 8–23)
CO2: 30 mmol/L (ref 22–32)
Calcium: 9.8 mg/dL (ref 8.9–10.3)
Chloride: 103 mmol/L (ref 98–111)
Creatinine, Ser: 1.4 mg/dL — ABNORMAL HIGH (ref 0.44–1.00)
GFR, Estimated: 38 mL/min — ABNORMAL LOW
Glucose, Bld: 135 mg/dL — ABNORMAL HIGH (ref 70–99)
Potassium: 4.3 mmol/L (ref 3.5–5.1)
Sodium: 141 mmol/L (ref 135–145)
Total Bilirubin: 0.6 mg/dL (ref 0.0–1.2)
Total Protein: 8.1 g/dL (ref 6.5–8.1)

## 2024-11-05 LAB — LIPASE, BLOOD: Lipase: 12 U/L (ref 11–51)

## 2024-11-05 MED ORDER — ACETAMINOPHEN 325 MG PO TABS
650.0000 mg | ORAL_TABLET | Freq: Four times a day (QID) | ORAL | Status: DC | PRN
  Administered 2024-11-06 – 2024-11-07 (×5): 650 mg via ORAL
  Filled 2024-11-05 (×5): qty 2

## 2024-11-05 MED ORDER — ACETAMINOPHEN 650 MG RE SUPP: 650.0000 mg | Freq: Four times a day (QID) | RECTAL | Status: DC | PRN

## 2024-11-05 MED ORDER — ONDANSETRON HCL 4 MG/2ML IJ SOLN
INTRAMUSCULAR | Status: AC
  Filled 2024-11-05: qty 2

## 2024-11-05 MED ORDER — TRIMETHOBENZAMIDE HCL 100 MG/ML IM SOLN
200.0000 mg | Freq: Once | INTRAMUSCULAR | Status: AC
  Administered 2024-11-05: 200 mg via INTRAMUSCULAR
  Filled 2024-11-05: qty 2

## 2024-11-05 MED ORDER — SODIUM CHLORIDE 0.9% FLUSH
3.0000 mL | Freq: Two times a day (BID) | INTRAVENOUS | Status: DC
  Administered 2024-11-05 – 2024-11-09 (×8): 3 mL via INTRAVENOUS

## 2024-11-05 MED ORDER — PANTOPRAZOLE SODIUM 40 MG IV SOLR
40.0000 mg | Freq: Two times a day (BID) | INTRAVENOUS | Status: DC
  Administered 2024-11-05 – 2024-11-07 (×4): 40 mg via INTRAVENOUS
  Filled 2024-11-05 (×4): qty 10

## 2024-11-05 MED ORDER — ONDANSETRON HCL 4 MG/2ML IJ SOLN
4.0000 mg | Freq: Once | INTRAMUSCULAR | Status: AC
  Administered 2024-11-05: 4 mg via INTRAVENOUS

## 2024-11-05 MED ORDER — LACTATED RINGERS IV BOLUS
500.0000 mL | Freq: Once | INTRAVENOUS | Status: AC
  Administered 2024-11-05: 500 mL via INTRAVENOUS

## 2024-11-05 MED ORDER — SODIUM CHLORIDE 0.9 % IV SOLN: INTRAVENOUS | Status: DC

## 2024-11-05 MED ORDER — IOHEXOL 350 MG/ML SOLN
75.0000 mL | Freq: Once | INTRAVENOUS | Status: AC | PRN
  Administered 2024-11-05: 75 mL via INTRAVENOUS

## 2024-11-05 MED ORDER — HYDRALAZINE HCL 20 MG/ML IJ SOLN: 10.0000 mg | Freq: Four times a day (QID) | INTRAMUSCULAR | Status: DC | PRN

## 2024-11-05 MED ORDER — MECLIZINE HCL 25 MG PO TABS
25.0000 mg | ORAL_TABLET | Freq: Once | ORAL | Status: AC
  Administered 2024-11-05: 25 mg via ORAL
  Filled 2024-11-05: qty 1

## 2024-11-05 MED ORDER — SODIUM CHLORIDE 0.9 % IV SOLN
12.5000 mg | Freq: Four times a day (QID) | INTRAVENOUS | Status: DC | PRN
  Administered 2024-11-05 – 2024-11-07 (×3): 12.5 mg via INTRAVENOUS
  Filled 2024-11-05 (×3): qty 12.5

## 2024-11-05 MED ORDER — MORPHINE SULFATE (PF) 4 MG/ML IV SOLN
4.0000 mg | Freq: Once | INTRAVENOUS | Status: AC
  Administered 2024-11-05: 4 mg via INTRAVENOUS
  Filled 2024-11-05: qty 1

## 2024-11-05 NOTE — ED Notes (Signed)
 Patient transported to MRI

## 2024-11-05 NOTE — H&P (Signed)
 " History and Physical    Patient: [name] FMW:[id number] DOB: 1940/12/22 DOA: 11/05/2024 DOS: the patient was seen and examined on 11/05/2024 . PCP: Charlette Erla LABOR, MD  Patient coming from: Home Chief complaint: Chief Complaint  Patient presents with   Nausea   HPI:  [name] is a 84 y.o. 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  Her primary cardiologist is Dr. Teodoro today with complaints of intractable nausea and vomiting dizziness palpitations that all started earlier today. Patient has a history of similar episodes in the past. Earlier this past year patient had her pacemaker generator implanted.  She does report that the last time she had normal bowel movement was on Thursday and she has been having diarrhea since Saturday. At bedside patient is alert awake oriented nontoxic obese and daughter is at bedside.   ED Course:  Vital signs in the ED were notable for the following:  Vitals:   11/05/24 1405 11/05/24 1618 11/05/24 1819 11/05/24 1854  BP:  119/72    Pulse:  67    Temp: (!) 97.5 F (36.4 C)   97.6 F (36.4 C)  Resp:  20    SpO2:  96% 96%   TempSrc: Oral   Oral   >>ED evaluation thus far shows: - CMP showing glucose of 135 creatinine 1.40 eGFR 38 normal electrolytes and LFTs otherwise. -Troponin flat at less than 15.EKG today shows atrial paced rhythm with pacer spikes  present rate of 63 QTc 509 PR 239 LVH. -CBC shows white count of 3.6 hemoglobin of 15.7 platelets of 150. -Viral respiratory panel ordered and pending. -GI stool PCR ordered and pending. -C.Difficile studies ordered and pending.  >>While in the ED patient received the following: Medications  promethazine  (PHENERGAN ) 12.5 mg in sodium chloride  0.9 % 50 mL IVPB (has no administration in time range)  pantoprazole  (PROTONIX ) injection 40 mg (has no administration in time range)  trimethobenzamide  (TIGAN ) injection 200 mg (200 mg Intramuscular Given 11/05/24 1056)  morphine  (PF) 4 MG/ML injection 4 mg (4 mg Intravenous Given 11/05/24 1050)  lactated ringers  bolus 500 mL (0 mLs Intravenous Stopped 11/05/24 1627)  meclizine  (ANTIVERT ) tablet 25 mg (25 mg Oral Given 11/05/24 1306)  ondansetron  (ZOFRAN ) injection 4 mg (4 mg Intravenous Given 11/05/24 1310)  ondansetron  (ZOFRAN ) 4 MG/2ML injection (  Given 11/05/24 1339)  iohexol  (OMNIPAQUE ) 350 MG/ML injection 75 mL (75 mLs Intravenous Contrast Given 11/05/24 1346)   Review of Systems  Gastrointestinal:  Positive for abdominal pain, nausea and vomiting.  Neurological:  Positive for dizziness and weakness.   Past Medical History:  Diagnosis Date   Arthritis    CHF (congestive heart failure) (HCC)    05/2011 echo severe concentric LVH, normal systolic fxn, grade 1 diastolic dysfxn, paradoxical septal motion   Chronic kidney disease-mineral and bone disorder 05/20/2020   Coronary artery disease    Cath 04/2006 normal coronaries, mild-mod slow flow in all coronaries with slow distall runoff, mild-mod LV enlargement, EF 55%  Depression    Dyspnea    Dysrhythmia    GERD (gastroesophageal reflux disease)    Hypercholesterolemia    Hyperlipemia    Hypertension    MI (myocardial infarction) (HCC)    NOT SURE HOW MANY   Morbid obesity (HCC)    Nocturia    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   Peripheral vascular disease    Pneumonia NONE RECENT    Sleep apnea    needs cpap but does not wear   Stroke [facility])    TIA (transient ischemic attack)    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    Vertigo    takes meclizine  daily   Past Surgical History:  Procedure Laterality Date   ABDOMINAL HYSTERECTOMY     BIOPSY  06/12/2022   Procedure: BIOPSY;  Surgeon: Eda Iha, MD;  Location: [facility] ENDOSCOPY;  Service: Gastroenterology;;   CARDIAC CATHETERIZATION     CATARACT EXTRACTION W/PHACO Left 08/29/2013   Procedure: CATARACT EXTRACTION PHACO AND INTRAOCULAR LENS PLACEMENT (IOC);  Surgeon: Jestine Bunnell, MD;  Location: [facility] OR;  Service: Ophthalmology;  Laterality: Left;   COLONOSCOPY WITH PROPOFOL  N/A 01/07/2014   Procedure: COLONOSCOPY WITH PROPOFOL ;  Surgeon: Renaye Sous, MD;  Location: [facility] ENDOSCOPY;  Service: Endoscopy;  Laterality: N/A;   CORONARY ANGIOGRAPHY N/A 05/27/2020   Procedure: CORONARY ANGIOGRAPHY;  Surgeon: Claudene Pacific, MD;  Location: [facility] INVASIVE CV LAB;  Service: Cardiovascular;  Laterality: N/A;   DILATION AND CURETTAGE OF UTERUS     ESOPHAGOGASTRODUODENOSCOPY (EGD) WITH PROPOFOL  N/A 06/12/2022   Procedure: ESOPHAGOGASTRODUODENOSCOPY (EGD) WITH PROPOFOL ;  Surgeon: Eda Iha, MD;  Location: [facility] ENDOSCOPY;  Service: Gastroenterology;  Laterality: N/A;   INSERT / REPLACE / REMOVE PACEMAKER     LEFT HEART CATH AND CORONARY ANGIOGRAPHY N/A 07/18/2020   Procedure: LEFT HEART CATH AND CORONARY ANGIOGRAPHY;  Surgeon: Claudene Pacific, MD;  Location: [facility] INVASIVE CV LAB;  Service: Cardiovascular;  Laterality: N/A;   PACEMAKER GENERATOR CHANGE N/A 03/27/2014   Procedure: PACEMAKER GENERATOR CHANGE;  Surgeon: Jerel Balding, MD;  Location: [facility] CATH LAB;  Service: Cardiovascular;  Laterality: N/A;   PACEMAKER INSERTION     PACEMAKER INSERTION     PPM GENERATOR CHANGEOUT N/A 11/17/2023   Procedure: PPM GENERATOR CHANGEOUT;  Surgeon: Balding Jerel, MD;  Location: [facility] INVASIVE CV LAB;  Service: Cardiovascular;  Laterality: N/A;    TUBAL LIGATION     VASCULAR SURGERY     vericose vein surgery    reports that she has never smoked. She has never used smokeless tobacco. She reports that she does not drink alcohol and does not use drugs. Allergies[1] No family history on file. Prior to Admission medications  Medication Sig Start Date End Date Taking? Authorizing Provider  acetaminophen  (TYLENOL ) 325 MG tablet Take 2 tablets (650 mg total) by mouth every 6 (six) hours. Patient not taking: Reported on 03/01/2024 08/14/22   Franchot Novel, MD  albuterol  (VENTOLIN  HFA) 108 [phone] Base) MCG/ACT inhaler Inhale 2 puffs into the lungs every 6 (six) hours as needed for wheezing or shortness of breath. Patient not taking: Reported on 03/01/2024    [provider]  allopurinol  (ZYLOPRIM ) 100 MG tablet Take 200 mg by mouth daily. 02/20/24   [provider]  aMILoride (MIDAMOR) 5 MG tablet Take 5 mg by mouth daily. 10/31/23   [provider]  amLODipine  (NORVASC ) 5 MG tablet Take 5 mg by mouth daily. 02/20/24   [provider]  apixaban  (ELIQUIS ) 5 MG  TABS tablet Take 1 tablet (5 mg total) by mouth 2 (two) times daily. 11/18/23   Croitoru, Mihai, MD  calcitRIOL (ROCALTROL) 0.5 MCG capsule Take 0.5 mcg by mouth daily. 11/02/23   [provider]  Cholecalciferol  (VITAMIN D -3) 125 MCG (5000 UT) TABS Take 5,000 Units by mouth daily.    [provider]  cloNIDine (CATAPRES) 0.1 MG tablet Take 0.1 mg by mouth 2 (two) times daily. 11/05/23   [provider]  clotrimazole (LOTRIMIN) 1 % cream Apply 1 Application topically 2 (two) times daily. 11/22/19   [provider]  diclofenac  Sodium (VOLTAREN ) 1 % GEL Apply topically 4 (four) times daily. 02/20/24   [provider]  doxercalciferol  (HECTOROL ) 0.5 MCG capsule Take 0.5 mcg by mouth daily. Patient not taking: Reported on 03/01/2024 05/18/22   [provider]  doxycycline  (MONODOX ) 100 MG capsule Take 100 mg by mouth 2  (two) times daily. 02/24/24   [provider]  FARXIGA 10 MG TABS tablet Take 10 mg by mouth daily. 05/18/22   [provider]  hydrALAZINE  (APRESOLINE ) 50 MG tablet Take 50 mg by mouth 2 (two) times daily. 10/03/23   [provider]  LINZESS 145 MCG CAPS capsule Take 145 mcg by mouth daily. 02/27/24   [provider]  losartan  (COZAAR ) 100 MG tablet Take 100 mg by mouth daily. 10/15/23   [provider]  meclizine  (ANTIVERT ) 25 MG tablet Take 25 mg by mouth daily. 05/18/22   [provider]  metoprolol  succinate (TOPROL -XL) 25 MG 24 hr tablet Take 25 mg by mouth daily. 02/22/24   [provider]  nitroGLYCERIN  (NITROSTAT ) 0.4 MG SL tablet Place 0.4 mg under the tongue every 5 (five) minutes as needed. Patient not taking: Reported on 03/01/2024 10/01/20   [provider]  nystatin ointment (MYCOSTATIN) Apply 1 Application topically 2 (two) times daily. 02/20/24   [provider]  NYSTATIN powder Apply 1 Application topically as needed. Patient not taking: Reported on 03/01/2024 02/20/24   [provider]  oxyCODONE -acetaminophen  (PERCOCET/ROXICET) 5-325 MG tablet Take 1 tablet by mouth 3 (three) times daily as needed. 02/24/24   [provider]  rosuvastatin  (CRESTOR ) 10 MG tablet Take 10 mg by mouth at bedtime.    [provider]  silver sulfADIAZINE (SILVADENE) 1 % cream Apply 1 Application topically daily.    [provider]  torsemide  (DEMADEX ) 20 MG tablet Take 20 mg by mouth 2 (two) times daily. 11/05/23   [provider]                                                                                 Vitals:   11/05/24 1405 11/05/24 1618 11/05/24 1819 11/05/24 1854  BP:  119/72    Pulse:  67    Resp:  20    Temp: (!) 97.5 F (36.4 C)   97.6 F (36.4 C)  TempSrc: Oral   Oral  SpO2:  96% 96%    Physical Exam Vitals reviewed.  Constitutional:      General: She is not in acute  distress.    Appearance: She is not ill-appearing.  HENT:     Head:  Normocephalic and atraumatic.  Eyes:     Extraocular Movements: Extraocular movements intact.  Cardiovascular:     Rate and Rhythm: Normal rate and regular rhythm.     Pulses: Normal pulses.     Heart sounds: Normal heart sounds.  Pulmonary:     Effort: Pulmonary effort is normal.     Breath sounds: Normal breath sounds.  Abdominal:     General: There is no distension.     Palpations: Abdomen is soft.     Tenderness: There is abdominal tenderness. There is guarding.  Musculoskeletal:     Right lower leg: Edema present.     Left lower leg: Edema present.  Skin:    Comments: BL skin changes  from her lymphedema and Lipodermatosclerosis of both legs. No open wounds noted on inspection.    Neurological:     General: No focal deficit present.     Mental Status: She is alert and oriented to person, place, and time.  Psychiatric:        Mood and Affect: Mood normal.     Labs on Admission: I have personally reviewed following labs and imaging studies CBC: Recent Labs  Lab 11/05/24 1048 11/05/24 1121  WBC 3.6*  --   NEUTROABS 2.3  --   HGB 15.7* 17.3*  HCT 49.2* 51.0*  MCV 99.4  --   PLT 150  --    Basic Metabolic Panel: Recent Labs  Lab 11/05/24 1048 11/05/24 1121  NA 141 142  K 4.3 4.2  CL 103 102  CO2 30  --   GLUCOSE 135* 132*  BUN 19 21  CREATININE 1.40* 1.40*  CALCIUM  9.8  --    GFR: CrCl cannot be calculated (Unknown ideal weight.). Liver Function Tests: Recent Labs  Lab 11/05/24 1048  AST 19  ALT 5  ALKPHOS 84  BILITOT 0.6  PROT 8.1  ALBUMIN 3.9   Recent Labs  Lab 11/05/24 1048  LIPASE 12   No results for input(s): AMMONIA in the last 168 hours. Recent Labs    11/09/23 1112 11/05/24 1048 11/05/24 1121  BUN 19 19 21   CREATININE 1.36* 1.40* 1.40*   Urine analysis:    Component Value Date/Time   COLORURINE YELLOW 06/03/2022 0630   APPEARANCEUR CLEAR 06/03/2022 0630    LABSPEC 1.010 06/03/2022 0630   PHURINE 6.0 06/03/2022 0630   GLUCOSEU >=500 (A) 06/03/2022 0630   HGBUR NEGATIVE 06/03/2022 0630   BILIRUBINUR NEGATIVE 06/03/2022 0630   KETONESUR NEGATIVE 06/03/2022 0630   PROTEINUR NEGATIVE 06/03/2022 0630   UROBILINOGEN 0.2 02/07/2013 0203   NITRITE NEGATIVE 06/03/2022 0630   LEUKOCYTESUR NEGATIVE 06/03/2022 0630   Radiological Exams on Admission: US  ABDOMEN LIMITED RUQ (LIVER/GB) Result Date: 11/05/2024 EXAM: Right Upper Quadrant Abdominal Ultrasound 11/05/2024 06:12:00 PM TECHNIQUE: Real-time ultrasonography of the right upper quadrant of the abdomen was performed. COMPARISON: CT abdomen and pelvis 11/05/2024. CLINICAL HISTORY: Pancreatitis, acute. FINDINGS: LIVER: Limited evaluation of the hepatic parenchyma due to rib shadowing. No intrahepatic biliary ductal dilatation. No evidence of mass. Hepatopetal flow in the portal vein. BILIARY SYSTEM: Calcified gallstones and gallbladder sludge within the gallbladder lumen. No gallbladder wall thickening or pericholecystic fluid. Common bile duct diameter of 2 mm. RIGHT KIDNEY: A simple renal cyst within the right kidney is again noted - no further follow-up indicated. No hydronephrosis. No echogenic calculi. No mass. PANCREAS: Visualized portions of the pancreas are unremarkable. OTHER: No right upper quadrant ascites. IMPRESSION: 1. Cholelithiasis and gallbladder sludge without sonographic evidence of  acute cholecystitis or biliary ductal dilatation. 2. Limited evaluation of the hepatic parenchyma due to rib shadowing. Electronically signed by: Morgane Naveau MD MD 11/05/2024 07:23 PM EST RP Workstation: [id number]   CT Head Wo Contrast Result Date: 11/05/2024 EXAM: CT HEAD WITHOUT CONTRAST 11/05/2024 05:13:44 PM TECHNIQUE: CT of the head was performed without the administration of intravenous contrast. Automated exposure control, iterative reconstruction, and/or weight based adjustment of the mA/kV was utilized  to reduce the radiation dose to as low as reasonably achievable. COMPARISON: 08/06/2022 CLINICAL HISTORY: Syncope/presyncope, cerebrovascular cause suspected. FINDINGS: BRAIN AND VENTRICLES: No acute hemorrhage. No evidence of acute infarct. No hydrocephalus. No extra-axial collection. No mass effect or midline shift. Moderate periventricular and deep white matter hypodensity typical of chronic small vessel ischemia. Partially empty sella. Atherosclerosis of skullbase vasculature without hyperdense vessel or abnormal calcification. ORBITS: Bilateral lens resection noted. SINUSES: No acute abnormality. SOFT TISSUES AND SKULL: No acute soft tissue abnormality. No skull fracture. IMPRESSION: 1. No acute intracranial abnormality. 2. Partially empty sella. Findings is often a normal anatomic variant but can be associated with idiopathic intracranial hypertension (pseudotumor cerebri). Electronically signed by: Morgane Naveau MD MD 11/05/2024 06:08 PM EST RP Workstation: [id number]   CT ABDOMEN PELVIS W CONTRAST Result Date: 11/05/2024 EXAM: CT ABDOMEN AND PELVIS WITH CONTRAST 11/05/2024 01:45:43 PM TECHNIQUE: CT of the abdomen and pelvis was performed with the administration of 75 mL of iohexol  (OMNIPAQUE ) 350 MG/ML injection. Multiplanar reformatted images are provided for review. Automated exposure control, iterative reconstruction, and/or weight-based adjustment of the mA/kV was utilized to reduce the radiation dose to as low as reasonably achievable. COMPARISON: 06/12/2022 CLINICAL HISTORY: Abdominal pain, acute, nonlocalized. FINDINGS: LOWER CHEST: Cardiomegaly. Partially visualized pacemaker wires in the right heart. Dependent posterior bibasilar atelectasis. LIVER: The liver is unremarkable. GALLBLADDER AND BILE DUCTS: Radiopaque punctate cholecystolithiasis. No wall thickening. No biliary ductal dilatation. SPLEEN: No acute abnormality. PANCREAS: Diffuse fatty atrophy of the pancreatic parenchyma. ADRENAL  GLANDS: No acute abnormality. KIDNEYS, URETERS AND BLADDER: Unchanged cyst in the lower pole of the right kidney. Per consensus, no follow-up is needed for simple Bosniak type 1 and 2 renal cysts, unless the patient has a malignancy history or risk factors. No stones in the kidneys or ureters. No hydronephrosis. No perinephric or periureteral stranding. Urinary bladder is distended without focal wall thickening. Unchanged bladder diverticulum anteriorly. GI AND BOWEL: Hiatal hernia . Stomach demonstrates no acute abnormality. Extensive colonic diverticulosis. Normal appendix. Small terminal iliodiverticulum also noted. No changes of acute diverticulitis. There is no bowel obstruction. PERITONEUM AND RETROPERITONEUM: No ascites. No free air. No free pelvic fluid. VASCULATURE: Aorta is normal in caliber. Diffuse aortoiliac atherosclerosis. LYMPH NODES: Unchanged mildly enlarged left lower paraaortic lymph node measuring 1.5 cm. Given the stability, this is likely benign. REPRODUCTIVE ORGANS: Hysterectomy. BONES AND SOFT TISSUES: Multilevel degenerative disc disease of the spine. Moderate osteoarthritis of the hips. No acute osseous abnormality. No focal soft tissue abnormality. IMPRESSION: 1. No acute findings in the abdomen or pelvis. 2. Terminal ileal and total colonic diverticulosis. No changes of acute diverticulitis. 3. Cholecystolithiasis. Electronically signed by: Rogelia Myers MD MD 11/05/2024 03:31 PM EST RP Workstation: [id number]   Data Reviewed: Relevant notes from primary care and specialist visits, past discharge summaries as available in EHR, including Care Everywhere . Prior diagnostic testing as pertinent to current admission diagnoses, Updated medications and problem lists for reconciliation .ED course, including vitals, labs, imaging, treatment and response to treatment,Triage notes, nursing and pharmacy notes and ED provider's  notes.Notable results as noted in HPI.Discussed case with EDMD/ ED  APP/ or Specialty MD on call and as needed.  Assessment & Plan  >> Intractable nausea vomiting: Patient has had similar symptoms in the past, differentials of course include GERD medication related, acute viral infection gastroenteritis, GERD. Will obtain a head CT and MRI , Clear liquid diet advance as tolerated.  Patient also has doxycycline  on her med list which could be contributing to esophagitis. Head ct shows Partially empty sella which was present in imaging in 2023 , d/w neuro md on call and is chronic. we will also obtain MRI of the brain for cva ruel out. No blurred vision per pt report.      >> Intractable diarrhea: Differentials include infectious however less likely. Suspect underlying constipation or overuse of Linzess.  Regardless we will hold patient's Linzess hydrate patient and follow.  Abdominal imaging is reassuring.  Will proceed with a viral panel and stool PCR panel.   >> Dizziness: Based on her clinical symptoms on her current meds suspect this is possibly and probably from orthostatic blood pressure and low blood pressure changes due to volume loss in addition to combination of meds that further lower her blood pressure in addition to diuretic therapy. Overnight will hold patient's amlodipine , Farxiga, hydralazine , losartan  continue patient on her metoprolol .  As needed hydralazine . Orthostatic vitals, Fall precautions.  PT evaluations prior to discharge.   >> Epigastric abdominal pain: Differentials include esophagitis reflux GERD cholecystitis, less likely pancreatitis with reassuring CT and negative lipase.  Will start patient on IV PPI, Right upper quadrant ultrasound and a GGT.   >> Essential hypertension: Vitals:   11/05/24 1017 11/05/24 1315 11/05/24 1618  BP: (!) 158/76 (!) 148/102 119/72  PTA meds include Norvasc  5 mg, clonidine 0.1 mg, hydralazine , losartan , metoprolol , torsemide .   >> Lymphedema: Bilateral lymphedema chronic and also patient's  morbid obesity is a major contributing factor.  Outpatient referral to lymphedema clinic bilateral compression stockings wound care elevation of extremities when at rest.  >> CAD: Currently no chest pain, troponin is flat, EKG is nonischemic.   >> PAF/  Sick sinus syndrome status post pacemaker: Interrogation as deemed appropriate. Currently paced rhythm on EKG, Cont eliquis .     >> CKD stage IIIb: Lab Results  Component Value Date   CREATININE 1.40 (H) 11/05/2024   CREATININE 1.40 (H) 11/05/2024   CREATININE 1.36 (H) 11/09/2023  Monitor and renally dose meds and avoid contrast.    >> Morbid obesity: A1c and clear liquid diet and advance as tolerated.    DVT prophylaxis:  Eliquis .   Consults:  None.  Advance Care Planning:    Code Status: Full Code   Family Communication:  Daughter at bedside.   Disposition Plan:  TBD.  Severity of Illness: The appropriate patient status for this patient is OBSERVATION. Observation status is judged to be reasonable and necessary in order to provide the required intensity of service to ensure the patient's safety. The patient's presenting symptoms, physical exam findings, and initial radiographic and laboratory data in the context of their medical condition is felt to place them at decreased risk for further clinical deterioration. Furthermore, it is anticipated that the patient will be medically stable for discharge from the hospital within 2 midnights of admission.   Unresulted Labs (From admission, onward)     Start     Ordered   11/06/24 0500  Comprehensive metabolic panel  Tomorrow morning,   R  11/05/24 1705   11/06/24 0500  CBC  Tomorrow morning,   R        11/05/24 1705   11/06/24 0500  Magnesium   Tomorrow morning,   R        11/05/24 1705   11/05/24 1704  Magnesium   Add-on,   AD        11/05/24 1705   11/05/24 1703  Procalcitonin  Add-on,   AD        11/05/24 1705   11/05/24 1618  Gamma GT  Add-on,   AD         11/05/24 1617   11/05/24 1617  C Difficile Quick Screen w PCR reflex  (C Difficile quick screen w PCR reflex panel )  Once, for 24 hours,   URGENT       References:    CDiff Information Tool   11/05/24 1616   11/05/24 1616  Respiratory (~20 pathogens) panel by PCR  (Respiratory panel by PCR (~20 pathogens, ~24 hr TAT)  w precautions)  Once,   URGENT        11/05/24 1616   11/05/24 1616  Gastrointestinal Panel by PCR , Stool  (Gastrointestinal Panel by PCR, Stool                                                                                                                                                     **Does Not include CLOSTRIDIUM DIFFICILE testing. **If CDIFF testing is needed, place order from the C Difficile Testing order set.**)  Once,   URGENT        11/05/24 1616           Meds ordered this encounter  Medications   trimethobenzamide  (TIGAN ) injection 200 mg   morphine  (PF) 4 MG/ML injection 4 mg   lactated ringers  bolus 500 mL   meclizine  (ANTIVERT ) tablet 25 mg   ondansetron  (ZOFRAN ) injection 4 mg   ondansetron  (ZOFRAN ) 4 MG/2ML injection    Joesph Perkins G: cabinet override   iohexol  (OMNIPAQUE ) 350 MG/ML injection 75 mL   promethazine  (PHENERGAN ) 12.5 mg in sodium chloride  0.9 % 50 mL IVPB   pantoprazole  (PROTONIX ) injection 40 mg   sodium chloride  flush (NS) 0.9 % injection 3 mL   DISCONTD: 0.9 %  sodium chloride  infusion   OR Linked Order Group    acetaminophen  (TYLENOL ) tablet 650 mg    acetaminophen  (TYLENOL ) suppository 650 mg   hydrALAZINE  (APRESOLINE ) injection 10 mg     Orders Placed This Encounter  Procedures   Respiratory (~20 pathogens) panel by PCR   Gastrointestinal Panel by PCR , Stool   C Difficile Quick Screen w PCR reflex   CT ABDOMEN PELVIS W CONTRAST   US  ABDOMEN LIMITED RUQ (LIVER/GB)   MR BRAIN WO CONTRAST   CT Head Wo Contrast  CBC with Differential   Comprehensive metabolic panel   Lipase, blood   Gamma GT   Comprehensive  metabolic panel   CBC   Procalcitonin   Magnesium    Magnesium    Diet clear liquid Room service appropriate? Yes; Fluid consistency: Thin   Maintain IV access   Vital signs   Notify physician (specify)   Refer to Sidebar Report Mobility Protocol for Adult Inpatient   Initiate Adult Central Line Maintenance and Catheter Clearance Protocol for patients with central line (CVC, PICC, Port, Hemodialysis, Trialysis)   Daily weights   Intake and Output   Initiate CHG Protocol for patients in ICU/SD or any patient with a central line or foley catheter   Do not place and if present remove PureWick   Initiate Oral Care Protocol   Initiate Carrier Fluid Protocol   RN may order General Admission PRN Orders utilizing General Admission PRN medications (through manage orders) for the following patient needs: allergy symptoms (Claritin), cold sores (Carmex), cough (Robitussin DM), eye irritation (Liquifilm Tears), hemorrhoids (Tucks), indigestion (Maalox), minor skin irritation (Hydrocortisone Cream), muscle pain Lucienne Gay), nose irritation (saline nasal spray) and sore throat (Chloraseptic spray).   Cardiac Monitoring - Continuous Indefinite   Ambulate with assistance   Full code   Consult for [facility] Admission   Consult to wound, ostomy, continence   Enteric precautions (UV disinfection)   PT eval and treat   Pulse oximetry check with vital signs   Oxygen therapy Mode or (Route): Nasal cannula; Liters Per Minute: 2; Keep O2 saturation between: greater than 92 %   I-stat chem 8, ED (not at [facility], [facility] or [facility])   EKG 12-Lead   EKG 12-Lead   EKG 12-Lead   Place in observation (patient's expected length of stay will be less than 2 midnights)   Aspiration precautions   Fall precautions   Author: Mario LULLA Blanch, MD 12 pm- 8 pm. [facility]. 11/05/2024 7:40 PM Please note for any communication after hours contact [facility] Assigned provider on call on Amion.     [1]  Allergies Allergen  Reactions   Omnipen [Ampicillin] Other (See Comments)    Increases heart rhythm    Penicillins Other (See Comments)    Increases heart rhythm    Linzess [Linaclotide] Other (See Comments)    Severe stomach pains   "

## 2024-11-05 NOTE — ED Notes (Signed)
 Patient transported to CT

## 2024-11-05 NOTE — ED Triage Notes (Signed)
 Pt. BIB [facility] from home with c/o N/V; Per [facility] the pt. Had multiple spells of vomiting; Per [facility] it started as bile colored then turned into dry heaves; The pt. C/o 8/10 upper abdominal pain; The pt. Has a pacemaker.   VS Per [facility]:  HR: 70 BP: 150/90 RR: 16 SpO2: 94% T: 97.0 CBG: 132

## 2024-11-05 NOTE — ED Provider Notes (Signed)
 " [name]   CSN: [id number] Arrival date & time: 11/05/24  1001     Patient presents with: Nausea   [name] is a 84 y.o. female.  HPI Patient is an 84 year old female presenting ED today for concerns for acute onset nausea, vomiting, watery diarrhea that started acutely this a.m. with generalized abdominal pain localizing to epigastric region.  History of prolonged QTc, complete heart block with pacemaker, stroke currently on Eliquis  with no reported missed doses, CHF, CKD.  Was supposed to see wound care today for chronic lymphedema.  Denies alcohol use or chronic NSAID use.   No known sick contacts.  Denies fever, headache, blurry vision, vertigo, chest pain, shortness of breath, flank pain, hematemesis, melena, hematochezia, dysuria, hematuria, vaginal discharge, vaginal bleeding.  Prior to Admission medications  Medication Sig Start Date End Date Taking? Authorizing Provider  acetaminophen  (TYLENOL ) 325 MG tablet Take 2 tablets (650 mg total) by mouth every 6 (six) hours. Patient not taking: Reported on 03/01/2024 08/14/22   Franchot Novel, MD  albuterol  (VENTOLIN  HFA) 108 [phone] Base) MCG/ACT inhaler Inhale 2 puffs into the lungs every 6 (six) hours as needed for wheezing or shortness of breath. Patient not taking: Reported on 03/01/2024    [provider]  allopurinol  (ZYLOPRIM ) 100 MG tablet Take 200 mg by mouth daily. 02/20/24   [provider]  aMILoride (MIDAMOR) 5 MG tablet Take 5 mg by mouth daily. 10/31/23   [provider]  amLODipine  (NORVASC ) 5 MG tablet Take 5 mg by mouth daily. 02/20/24   [provider]  apixaban  (ELIQUIS ) 5 MG TABS tablet Take 1 tablet (5 mg total) by mouth 2 (two) times daily. 11/18/23   Croitoru, Mihai, MD  calcitRIOL (ROCALTROL) 0.5 MCG capsule Take 0.5 mcg by mouth daily. 11/02/23   [provider]  Cholecalciferol  (VITAMIN D -3) 125  MCG (5000 UT) TABS Take 5,000 Units by mouth daily.    [provider]  cloNIDine (CATAPRES) 0.1 MG tablet Take 0.1 mg by mouth 2 (two) times daily. 11/05/23   [provider]  clotrimazole (LOTRIMIN) 1 % cream Apply 1 Application topically 2 (two) times daily. 11/22/19   [provider]  diclofenac  Sodium (VOLTAREN ) 1 % GEL Apply topically 4 (four) times daily. 02/20/24   [provider]  doxercalciferol  (HECTOROL ) 0.5 MCG capsule Take 0.5 mcg by mouth daily. Patient not taking: Reported on 03/01/2024 05/18/22   [provider]  doxycycline  (MONODOX ) 100 MG capsule Take 100 mg by mouth 2 (two) times daily. 02/24/24   [provider]  FARXIGA 10 MG TABS tablet Take 10 mg by mouth daily. 05/18/22   [provider]  hydrALAZINE  (APRESOLINE ) 50 MG tablet Take 50 mg by mouth 2 (two) times daily. 10/03/23   [provider]  LINZESS 145 MCG CAPS capsule Take 145 mcg by mouth daily. 02/27/24   [provider]  losartan  (COZAAR ) 100 MG tablet Take 100 mg by mouth daily. 10/15/23   [provider]  meclizine  (ANTIVERT ) 25 MG tablet Take 25 mg by mouth daily. 05/18/22   [provider]  metoprolol  succinate (TOPROL -XL) 25 MG 24 hr tablet Take 25 mg by mouth daily. 02/22/24   [provider]  nitroGLYCERIN  (NITROSTAT ) 0.4 MG SL tablet Place 0.4 mg under the tongue every 5 (five) minutes as needed. Patient not taking: Reported on 03/01/2024 10/01/20   [provider]  nystatin ointment (MYCOSTATIN) Apply 1  Application topically 2 (two) times daily. 02/20/24   [provider]  NYSTATIN powder Apply 1 Application topically as needed. Patient not taking: Reported on 03/01/2024 02/20/24   [provider]  oxyCODONE -acetaminophen  (PERCOCET/ROXICET) 5-325 MG tablet Take 1 tablet by mouth 3 (three) times daily as needed. 02/24/24   [provider]  rosuvastatin  (CRESTOR ) 10 MG tablet Take 10 mg by  mouth at bedtime.    [provider]  silver sulfADIAZINE (SILVADENE) 1 % cream Apply 1 Application topically daily.    [provider]  torsemide  (DEMADEX ) 20 MG tablet Take 20 mg by mouth 2 (two) times daily. 11/05/23   [provider]    Allergies: Omnipen [ampicillin], Penicillins, and Linzess [linaclotide]    Review of Systems  All other systems reviewed and are negative.   Updated Vital Signs BP (!) 148/102   Pulse 65   Temp (!) 97.5 F (36.4 C) (Oral)   Resp (!) 22   SpO2 100%   Physical Exam Vitals and nursing [name] reviewed.  Constitutional:      General: She is not in acute distress.    Appearance: Normal appearance. She is not ill-appearing or diaphoretic.  HENT:     Head: Normocephalic and atraumatic.  Eyes:     General: No scleral icterus.       Right eye: No discharge.        Left eye: No discharge.     Extraocular Movements: Extraocular movements intact.     Conjunctiva/sclera: Conjunctivae normal.  Cardiovascular:     Rate and Rhythm: Normal rate and regular rhythm.     Pulses: Normal pulses.     Heart sounds: Normal heart sounds. No murmur heard.    No friction rub. No gallop.  Pulmonary:     Effort: Pulmonary effort is normal. No respiratory distress.     Breath sounds: No stridor. No wheezing, rhonchi or rales.  Chest:     Chest wall: No tenderness.  Abdominal:     General: Abdomen is flat. There is no distension.     Palpations: Abdomen is soft.     Tenderness: There is abdominal tenderness (Generalized abdominal tenderness to palpation.  Glazing to epigastric region). There is no right CVA tenderness, left CVA tenderness, guarding or rebound.  Musculoskeletal:        General: No swelling, deformity or signs of injury.     Cervical back: Normal range of motion. No rigidity.     Right lower leg: Edema present.     Left lower leg: Edema present.     Comments: Chronic lymphedema present with no signs of active infection to  wounds.  Skin:    General: Skin is warm and dry.     Findings: No bruising, erythema or lesion.  Neurological:     General: No focal deficit present.     Mental Status: She is alert and oriented to person, place, and time. Mental status is at baseline.     Sensory: No sensory deficit.  Psychiatric:        Mood and Affect: Mood normal.     (all labs ordered are listed, but only abnormal results are displayed) Labs Reviewed  CBC WITH DIFFERENTIAL/PLATELET - Abnormal; Notable for the following components:      Result Value   WBC 3.6 (*)    Hemoglobin 15.7 (*)    HCT 49.2 (*)    RDW 16.2 (*)    All other components within normal limits  COMPREHENSIVE  METABOLIC PANEL WITH GFR - Abnormal; Notable for the following components:   Glucose, Bld 135 (*)    Creatinine, Ser 1.40 (*)    GFR, Estimated 38 (*)    All other components within normal limits  I-STAT CHEM 8, ED - Abnormal; Notable for the following components:   Creatinine, Ser 1.40 (*)    Glucose, Bld 132 (*)    Hemoglobin 17.3 (*)    HCT 51.0 (*)    All other components within normal limits  LIPASE, BLOOD  TROPONIN T, HIGH SENSITIVITY  TROPONIN T, HIGH SENSITIVITY    EKG: EKG Interpretation Date/Time:  Monday November 05 2024 13:03:43 EST Ventricular Rate:  63 PR Interval:  239 QRS Duration:  199 QT Interval:  497 QTC Calculation: 509 R Axis:   -87  Text Interpretation: Atrial-paced rhythm Nonspecific IVCD with LAD Left ventricular hypertrophy No significant change since last tracing Confirmed by Ellouise Fine ([id number]) on 11/05/2024 2:04:11 PM  Radiology: CT ABDOMEN PELVIS W CONTRAST Result Date: 11/05/2024 EXAM: CT ABDOMEN AND PELVIS WITH CONTRAST 11/05/2024 01:45:43 PM TECHNIQUE: CT of the abdomen and pelvis was performed with the administration of 75 mL of iohexol  (OMNIPAQUE ) 350 MG/ML injection. Multiplanar reformatted images are provided for review. Automated exposure control, iterative reconstruction, and/or  weight-based adjustment of the mA/kV was utilized to reduce the radiation dose to as low as reasonably achievable. COMPARISON: 06/12/2022 CLINICAL HISTORY: Abdominal pain, acute, nonlocalized. FINDINGS: LOWER CHEST: Cardiomegaly. Partially visualized pacemaker wires in the right heart. Dependent posterior bibasilar atelectasis. LIVER: The liver is unremarkable. GALLBLADDER AND BILE DUCTS: Radiopaque punctate cholecystolithiasis. No wall thickening. No biliary ductal dilatation. SPLEEN: No acute abnormality. PANCREAS: Diffuse fatty atrophy of the pancreatic parenchyma. ADRENAL GLANDS: No acute abnormality. KIDNEYS, URETERS AND BLADDER: Unchanged cyst in the lower pole of the right kidney. Per consensus, no follow-up is needed for simple Bosniak type 1 and 2 renal cysts, unless the patient has a malignancy history or risk factors. No stones in the kidneys or ureters. No hydronephrosis. No perinephric or periureteral stranding. Urinary bladder is distended without focal wall thickening. Unchanged bladder diverticulum anteriorly. GI AND BOWEL: Hiatal hernia . Stomach demonstrates no acute abnormality. Extensive colonic diverticulosis. Normal appendix. Small terminal iliodiverticulum also noted. No changes of acute diverticulitis. There is no bowel obstruction. PERITONEUM AND RETROPERITONEUM: No ascites. No free air. No free pelvic fluid. VASCULATURE: Aorta is normal in caliber. Diffuse aortoiliac atherosclerosis. LYMPH NODES: Unchanged mildly enlarged left lower paraaortic lymph node measuring 1.5 cm. Given the stability, this is likely benign. REPRODUCTIVE ORGANS: Hysterectomy. BONES AND SOFT TISSUES: Multilevel degenerative disc disease of the spine. Moderate osteoarthritis of the hips. No acute osseous abnormality. No focal soft tissue abnormality. IMPRESSION: 1. No acute findings in the abdomen or pelvis. 2. Terminal ileal and total colonic diverticulosis. No changes of acute diverticulitis. 3. Cholecystolithiasis.  Electronically signed by: Rogelia Myers MD MD 11/05/2024 03:31 PM EST RP Workstation: [id number]    Procedures   Medications Ordered in the ED  promethazine  (PHENERGAN ) 12.5 mg in sodium chloride  0.9 % 50 mL IVPB (has no administration in time range)  trimethobenzamide  (TIGAN ) injection 200 mg (200 mg Intramuscular Given 11/05/24 1056)  morphine  (PF) 4 MG/ML injection 4 mg (4 mg Intravenous Given 11/05/24 1050)  lactated ringers  bolus 500 mL (500 mLs Intravenous New Bag/Given 11/05/24 1404)  meclizine  (ANTIVERT ) tablet 25 mg (25 mg Oral Given 11/05/24 1306)  ondansetron  (ZOFRAN ) injection 4 mg (4 mg Intravenous Given 11/05/24 1310)  ondansetron  (ZOFRAN ) 4 MG/2ML injection (  Given 11/05/24 1339)  iohexol  (OMNIPAQUE ) 350 MG/ML injection 75 mL (75 mLs Intravenous Contrast Given 11/05/24 1346)    Clinical Course as of 11/05/24 1616  Mon Nov 05, 2024  1547 S - Intractable N/V. Labs okay. CT okay. Admit [DZ]    Clinical Course User Index [DZ] Laurita Sieving, MD    Medical Decision Making Amount and/or Complexity of Data Reviewed Labs: ordered. Radiology: ordered. ECG/medicine tests: ordered.  Risk Prescription drug management. Decision regarding hospitalization.  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  Has been taking all her medications as prescribed without any missed doses.  On physical exam, patient is in no acute distress, afebrile, alert and orient x 4, speaking in full sentences, nontachypneic, nontachycardic.  Notable does have some mild epigastric abdominal tenderness to palpation as well as lymphedema bilaterally with no active signs of infection currently, wounds present.  No CVA tenderness.  Consented to use 3 times of antiemetics with patient's persistent nausea and vomiting.  Vertigo has  improved.  But nausea and vomiting persist.  CT scan is unremarkable with lab work also unremarkable.  Will admit for intractable nausea and vomiting  Case was discussed with attending who agreed with plan.  Patient care transferred to Dr. Tobie with hospitalist group.  Differential diagnoses prior to evaluation: The emergent differential diagnosis includes, but is not limited to, BPPV, CVA, dehydration, metabolic disturbance, SBO, LBO, gastritis, gastroenteritis, pancreatitis, ACS, diverticulitis, cholecystitis. This is not an exhaustive differential.   Past Medical History / Co-morbidities / Social History: CAD, pacemaker, stroke currently on Eliquis , CHF, vertigo, PVD, HTN, CKD  Additional history: Chart reviewed. Pertinent results include:   Last seen by wound care on 10/15/2024 for bilateral lower leg ulcerations with longstanding history of bilateral lower leg edema.  Wounds have been present for several months.  Lab Tests/Imaging studies: I personally interpreted labs/imaging and the pertinent results include: CBC notes a decreased hemoglobin Due to dehydration CMP noted to be near baseline creatinine and GFR Lipase unremarkable Troponin undetectable CT scan does not show any acute abnormalities.   I agree with the radiologist interpretation.  Cardiac monitoring: EKG obtained and interpreted by myself and attending physician which shows: Paced rhythm  EKG Interpretation Date/Time:  Monday November 05 2024 13:03:43 EST Ventricular Rate:  63 PR Interval:  239 QRS Duration:  199 QT Interval:  497 QTC Calculation: 509 R Axis:   -87  Text Interpretation: Atrial-paced rhythm Nonspecific IVCD with LAD Left ventricular hypertrophy No significant change since last tracing Confirmed by Ellouise Fine ([id number]) on 11/05/2024 2:04:11 PM          Medications: I ordered medication including Tigan , Zofran , Phenergan , LR.  I have reviewed the patients home medicines and have made  adjustments as needed.  Critical Interventions:   Social Determinants of Health:  Disposition: After consideration of the diagnostic results and the patients response to treatment, I feel that the patient would benefit from, patient care transferred over to Dr. Tobie with hospitalist   Final diagnoses:  Intractable nausea and vomiting    ED Discharge Orders     None          Beola Terrall RAMAN, [place] 11/05/24 1618  "

## 2024-11-05 NOTE — ED Notes (Signed)
 Help get patient changed sheets and gown a external cath was placed did EKG shown to Dr Ellouise patient is resting with call bell in reach

## 2024-11-05 NOTE — ED Notes (Signed)
 CCMD called; Pt. Is on monitoring

## 2024-11-06 ENCOUNTER — Encounter (HOSPITAL_COMMUNITY): Payer: Self-pay | Admitting: Internal Medicine

## 2024-11-06 ENCOUNTER — Observation Stay (HOSPITAL_COMMUNITY)

## 2024-11-06 LAB — COMPREHENSIVE METABOLIC PANEL WITH GFR
ALT: <5 U/L (ref 0–44)
AST: 16 U/L (ref 15–41)
Albumin: 3.4 g/dL — ABNORMAL LOW (ref 3.5–5.0)
Alkaline Phosphatase: 72 U/L (ref 38–126)
Anion gap: 7 (ref 5–15)
BUN: 16 mg/dL (ref 8–23)
CO2: 29 mmol/L (ref 22–32)
Calcium: 8.9 mg/dL (ref 8.9–10.3)
Chloride: 105 mmol/L (ref 98–111)
Creatinine, Ser: 1.32 mg/dL — ABNORMAL HIGH (ref 0.44–1.00)
GFR, Estimated: 40 mL/min — ABNORMAL LOW
Glucose, Bld: 98 mg/dL (ref 70–99)
Potassium: 4.1 mmol/L (ref 3.5–5.1)
Sodium: 141 mmol/L (ref 135–145)
Total Bilirubin: 0.6 mg/dL (ref 0.0–1.2)
Total Protein: 6.9 g/dL (ref 6.5–8.1)

## 2024-11-06 LAB — RESPIRATORY PANEL BY PCR

## 2024-11-06 LAB — CBC
HCT: 45 % (ref 36.0–46.0)
Hemoglobin: 14.3 g/dL (ref 12.0–15.0)
MCH: 32.2 pg (ref 26.0–34.0)
MCHC: 31.8 g/dL (ref 30.0–36.0)
MCV: 101.4 fL — ABNORMAL HIGH (ref 80.0–100.0)
Platelets: 146 K/uL — ABNORMAL LOW (ref 150–400)
RBC: 4.44 MIL/uL (ref 3.87–5.11)
RDW: 16.2 % — ABNORMAL HIGH (ref 11.5–15.5)
WBC: 4.5 K/uL (ref 4.0–10.5)
nRBC: 0 % (ref 0.0–0.2)

## 2024-11-06 LAB — PROCALCITONIN: Procalcitonin: <0.1 ng/mL

## 2024-11-06 LAB — C DIFFICILE QUICK SCREEN W PCR REFLEX
C Diff antigen: NEGATIVE
C Diff interpretation: NOT DETECTED
C Diff toxin: NEGATIVE

## 2024-11-06 LAB — MAGNESIUM: Magnesium: 2.2 mg/dL (ref 1.7–2.4)

## 2024-11-06 LAB — GAMMA GT: GGT: 14 U/L (ref 7–50)

## 2024-11-06 MED ORDER — METOPROLOL SUCCINATE ER 25 MG PO TB24
25.0000 mg | ORAL_TABLET | Freq: Every day | ORAL | Status: DC
  Administered 2024-11-06 – 2024-11-09 (×4): 25 mg via ORAL
  Filled 2024-11-06 (×4): qty 1

## 2024-11-06 MED ORDER — APIXABAN 5 MG PO TABS
5.0000 mg | ORAL_TABLET | Freq: Two times a day (BID) | ORAL | Status: DC
  Administered 2024-11-06 – 2024-11-09 (×7): 5 mg via ORAL
  Filled 2024-11-06 (×7): qty 1

## 2024-11-06 MED ORDER — SACUBITRIL-VALSARTAN 49-51 MG PO TABS
1.0000 | ORAL_TABLET | Freq: Two times a day (BID) | ORAL | Status: DC
  Administered 2024-11-06 – 2024-11-09 (×7): 1 via ORAL
  Filled 2024-11-06 (×8): qty 1

## 2024-11-06 NOTE — Progress Notes (Signed)
 This patient is not eligible for MRI due to her original pacemaker leads from 2005 per Prentice Passey, Cardiology PA.

## 2024-11-06 NOTE — Progress Notes (Signed)
 Transition of Care [facility]) - Inpatient Brief Assessment   Patient Details  Name: [name] MRN: [id number] Date of Birth: 02/04/1941  [facility] of [facility] CM/SW Contact:    Debarah Saunas, RN Phone Number: 11/06/2024, 9:57 AM   Clinical Narrative: RNCM following for discharge needs.   Transition of Care Asessment: Insurance and Status: (P) Insurance coverage has been reviewed Patient has primary care physician: (P) Yes Home environment has been reviewed: (P) from home alone Prior level of function:: (P) has personal care aid 4 days/week; has hospital bed, lift chair, wheelchair and rolling walker Prior/Current Home Services: (P) Current home services Social Drivers of Health Review: (P) SDOH reviewed no interventions necessary Readmission risk has been reviewed: (P) No Transition of care needs: (P) transition of care needs identified, TOC will continue to follow

## 2024-11-06 NOTE — ED Notes (Signed)
 Floor notified patient coming to floor

## 2024-11-06 NOTE — Care Management Obs Status (Signed)
 MEDICARE OBSERVATION STATUS NOTIFICATION   Patient Details  Name: [name] MRN: [id number] Date of Birth: September 03, 1941   Medicare Observation Status Notification Given:  Yes    Debarah Saunas, RN 11/06/2024, 9:53 AM

## 2024-11-06 NOTE — Consult Note (Signed)
 WOC Nurse Consult Note: Reason for Consult: lymphedema Wound type:  Venous ulcer to L lower extremity, medial side, patient with history of lymphedema BLE, follows at the outpatient wound care center with Unna boot use, boots are off at time of visit.  Patient reports she is due for a follow up with the wound care center but now hospitalized. Pressure Injury POA: NA- not pressure Measurement: 2 small areas measuring 1 cm x 1 cm each Wound bed: dry, yellow Drainage (amount, consistency, odor) scant, dry scab at time of visit Periwound: lymphedema with chronic skin changes  Dressing procedure/placement/frequency:  LLE: cleanse with Vashe (lawson # [id number]) allow to air dry.  Place xeroform over wound bed and cover with silicone foam dressing.  Apply Unna boots to bilateral LE.  Unna boot to be applied by ortho tech.    WOC team will not follow patient at this time, please re consult if new needs arise.  Thank you,  Doyal Polite, MSN, RN, [facility] WOC Team [phone] (Available Mon-Fri 0700-1500)

## 2024-11-06 NOTE — Progress Notes (Signed)
 " PROGRESS NOTE    [name]  FMW:[id number] DOB: 06/15/1941 DOA: 11/05/2024 PCP: Charlette Erla LABOR, MD  Subjective: Patient reports feeling weak and tired still. Continues to have diarrhea, still feels nauseous and had an episode of vomiting after eating Jello.   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  Head CT showed partially empty sella, which was discussed with neurology, and is chronic.  MRI unable to be obtained due to incompatibility with her pacemaker.  CT abdomen/pelvis showed no acute findings in the abdomen or pelvis, ultrasound of the abdomen showed cholelithiasis and gallbladder sludge without evidence of acute cholecystitis   Assessment and Plan:  Nausea, vomiting, diarrhea  - differentials include viral gastroenteritis, GERD, overuse of Linzess - continue clear liquid diet today, still with episodes of emesis. Advance diet as tolerated  - continue PRN anti-emetics  - continue to hold Linzess - Cdiff and GI PCR pending, if negative remove enteric isolation precautions  - continue gently IVF if PO intake continues to be poor but with caution given her HFpEF - continue IV PPI for now, change to PO as appropriate   Dizziness - likely from volume depletion due to her nausea/vomiting and diarrhea. Renal function is at baseline  - holding amlodipine  (reportedly doesn't take at home), hydralazine , losartan , torsemide , aldactone and clonidine  - PT eval - check orthostatic if able   HFpEF  - holding torsemide , aldactone  - resumed entresto   - resume farxiga as able  - appears that amiloride was  recently discontinued and changed to   CKD - renal function at baseline   Afib - resumed metoprolol  and eliquis   Chronic lymphedema with chronic venous skin changes - appreciate wound care  ?DM2 - she has documented history of DM2, but her HbA1c has been <6 since 2012 - resume farxiga as able, doesn't need CBG checks or insulin   Obesity - f/u with PCP as outpatient     DVT prophylaxis:  apixaban  (ELIQUIS ) tablet 5 mg    Code Status: Full Code Disposition Plan: TBD Reason for continuing need for hospitalization: advance diet   Objective: Vitals:   11/06/24 1000 11/06/24 1156 11/06/24 1220 11/06/24 1221  BP: 131/75 (!) 142/82 (!) 157/73   Pulse: 66  63   Resp: (!) 21  19   Temp: 98 F (36.7 C)  98.9 F (37.2 C)   TempSrc: Oral     SpO2: 93%  90%   Weight:    (!) 172.4 kg  Height:    5' 6 (1.676 m)   No intake or output data in the 24 hours ending 11/06/24 1248 Filed Weights   11/06/24 1221  Weight: (!) 172.4 kg    Examination:  Physical Exam Vitals and nursing note reviewed.  Constitutional:      General: She is not in acute distress.    Appearance: She is obese.  Cardiovascular:     Rate and Rhythm: Normal rate.  Pulmonary:     Effort: No respiratory distress.  Abdominal:     General: There is no distension.  Musculoskeletal:     Right lower leg: Edema present.     Left lower leg:  Edema present.  Skin:    Comments: Chronic venous stasis skin changes      Data Reviewed: I have personally reviewed following labs and imaging studies  CBC: Recent Labs  Lab 11/05/24 1048 11/05/24 1121 11/06/24 0437  WBC 3.6*  --  4.5  NEUTROABS 2.3  --   --   HGB 15.7* 17.3* 14.3  HCT 49.2* 51.0* 45.0  MCV 99.4  --  101.4*  PLT 150  --  146*   Basic Metabolic Panel: Recent Labs  Lab 11/05/24 1048 11/05/24 1121 11/06/24 0437  NA 141 142 141  K 4.3 4.2 4.1  CL 103 102 105  CO2 30  --  29  GLUCOSE 135* 132* 98  BUN 19 21 16   CREATININE 1.40* 1.40*  1.32*  CALCIUM  9.8  --  8.9  MG  --   --  2.2   GFR: Estimated Creatinine Clearance: 55.1 mL/min (A) (by C-G formula based on SCr of 1.32 mg/dL (H)). Liver Function Tests: Recent Labs  Lab 11/05/24 1048 11/06/24 0437  AST 19 16  ALT 5 <5  ALKPHOS 84 72  BILITOT 0.6 0.6  PROT 8.1 6.9  ALBUMIN 3.9 3.4*   Recent Labs  Lab 11/05/24 1048  LIPASE 12   No results for input(s): AMMONIA in the last 168 hours. Coagulation Profile: No results for input(s): INR, PROTIME in the last 168 hours. Cardiac Enzymes: No results for input(s): CKTOTAL, CKMB, CKMBINDEX, TROPONINI in the last 168 hours. ProBNP, BNP (last 5 results) No results for input(s): PROBNP, BNP in the last 8760 hours. HbA1C: No results for input(s): HGBA1C in the last 72 hours. CBG: No results for input(s): GLUCAP in the last 168 hours. Lipid Profile: No results for input(s): CHOL, HDL, LDLCALC, TRIG, CHOLHDL, LDLDIRECT in the last 72 hours. Thyroid  Function Tests: No results for input(s): TSH, T4TOTAL, FREET4, T3FREE, THYROIDAB in the last 72 hours. Anemia Panel: No results for input(s): VITAMINB12, FOLATE, FERRITIN, TIBC, IRON, RETICCTPCT in the last 72 hours. Sepsis Labs: No results for input(s): PROCALCITON, LATICACIDVEN in the last 168 hours.  Recent Results (from the past 240 hours)  Respiratory (~20 pathogens) panel by PCR     Status: None   Collection Time: 11/06/24  4:37 AM   Specimen: Nasopharyngeal Swab; Respiratory  Result Value Ref Range Status   Adenovirus NOT DETECTED NOT DETECTED Final   Coronavirus 229E NOT DETECTED NOT DETECTED Final    Comment: (NOTE) The Coronavirus on the Respiratory Panel, DOES NOT test for the novel  Coronavirus (2019 nCoV)    Coronavirus HKU1 NOT DETECTED NOT DETECTED Final   Coronavirus NL63 NOT DETECTED NOT DETECTED Final   Coronavirus OC43 NOT DETECTED NOT DETECTED Final   Metapneumovirus NOT DETECTED NOT  DETECTED Final   Rhinovirus / Enterovirus NOT DETECTED NOT DETECTED Final   Influenza A NOT DETECTED NOT DETECTED Final   Influenza B NOT DETECTED NOT DETECTED Final   Parainfluenza Virus 1 NOT DETECTED NOT DETECTED Final   Parainfluenza Virus 2 NOT DETECTED NOT DETECTED Final   Parainfluenza Virus 3 NOT DETECTED NOT DETECTED Final   Parainfluenza Virus 4 NOT DETECTED NOT DETECTED Final   Respiratory Syncytial Virus NOT DETECTED NOT DETECTED Final   Bordetella pertussis NOT DETECTED NOT DETECTED Final   Bordetella Parapertussis NOT DETECTED NOT DETECTED Final   Chlamydophila pneumoniae NOT DETECTED NOT DETECTED Final   Mycoplasma pneumoniae NOT DETECTED NOT DETECTED Final    Comment: Performed at [facility], [address], [place], [place] [address]     Radiology Studies: US  ABDOMEN LIMITED RUQ (LIVER/GB) Result Date: 11/05/2024 EXAM: Right Upper Quadrant Abdominal Ultrasound 11/05/2024 06:12:00 PM TECHNIQUE: Real-time ultrasonography of the right upper quadrant of the abdomen was performed. COMPARISON: CT abdomen and pelvis 11/05/2024. CLINICAL HISTORY: Pancreatitis, acute. FINDINGS: LIVER: Limited evaluation of the hepatic parenchyma due to rib shadowing. No intrahepatic biliary ductal dilatation. No evidence of mass. Hepatopetal flow in the portal vein. BILIARY SYSTEM: Calcified gallstones and gallbladder sludge within the gallbladder lumen. No gallbladder wall thickening or pericholecystic fluid. Common bile duct diameter of 2 mm. RIGHT KIDNEY: A simple renal cyst within the right kidney is again noted - no further follow-up indicated. No hydronephrosis. No echogenic calculi. No mass. PANCREAS: Visualized portions of the pancreas are unremarkable. OTHER: No right upper quadrant ascites. IMPRESSION: 1. Cholelithiasis and gallbladder sludge without sonographic evidence of acute cholecystitis or biliary ductal dilatation. 2. Limited evaluation of the hepatic parenchyma due to rib  shadowing. Electronically signed by: Morgane Naveau MD MD 11/05/2024 07:23 PM EST RP Workstation: [id number]   CT Head Wo Contrast Result Date: 11/05/2024 EXAM: CT HEAD WITHOUT CONTRAST 11/05/2024 05:13:44 PM TECHNIQUE: CT of the head was performed without the administration of intravenous contrast. Automated exposure control, iterative reconstruction, and/or weight based adjustment of the mA/kV was utilized to reduce the radiation dose to as low as reasonably achievable. COMPARISON: 08/06/2022 CLINICAL HISTORY: Syncope/presyncope, cerebrovascular cause suspected. FINDINGS: BRAIN AND VENTRICLES: No acute hemorrhage. No evidence of acute infarct. No hydrocephalus. No extra-axial collection. No mass effect or midline shift. Moderate periventricular and deep white matter hypodensity typical of chronic small vessel ischemia. Partially empty sella. Atherosclerosis of skullbase vasculature without hyperdense vessel or abnormal calcification. ORBITS: Bilateral lens resection noted. SINUSES: No acute abnormality. SOFT TISSUES AND SKULL: No acute soft tissue abnormality. No skull fracture. IMPRESSION: 1. No acute intracranial abnormality. 2. Partially empty sella. Findings is often a normal anatomic variant but can be associated with idiopathic intracranial hypertension (pseudotumor cerebri). Electronically signed by: Morgane Naveau MD MD 11/05/2024 06:08 PM EST RP Workstation: [id number]   CT ABDOMEN PELVIS W CONTRAST Result Date: 11/05/2024 EXAM: CT ABDOMEN AND PELVIS WITH CONTRAST 11/05/2024 01:45:43 PM TECHNIQUE: CT of the abdomen and pelvis was performed with the administration of 75 mL of iohexol  (OMNIPAQUE ) 350 MG/ML injection. Multiplanar reformatted images are provided for review. Automated exposure control, iterative reconstruction, and/or weight-based adjustment of the mA/kV was utilized to reduce the radiation dose to as low as reasonably achievable. COMPARISON: 06/12/2022 CLINICAL HISTORY: Abdominal pain,  acute, nonlocalized. FINDINGS: LOWER CHEST: Cardiomegaly. Partially visualized pacemaker wires in the right heart. Dependent posterior bibasilar atelectasis. LIVER: The liver is unremarkable. GALLBLADDER AND BILE DUCTS: Radiopaque punctate cholecystolithiasis. No wall thickening. No biliary ductal dilatation. SPLEEN: No acute abnormality. PANCREAS: Diffuse fatty atrophy of the pancreatic parenchyma. ADRENAL GLANDS: No acute abnormality. KIDNEYS, URETERS AND BLADDER: Unchanged cyst in the lower pole of the right kidney. Per consensus, no follow-up is needed for simple Bosniak type 1 and 2 renal cysts, unless the patient has a malignancy history or risk factors. No stones in the kidneys or ureters. No hydronephrosis. No perinephric or periureteral stranding. Urinary bladder is distended without focal wall thickening. Unchanged bladder diverticulum anteriorly. GI AND BOWEL: Hiatal hernia . Stomach demonstrates no acute abnormality. Extensive colonic diverticulosis. Normal appendix. Small terminal iliodiverticulum also noted. No changes of acute diverticulitis. There is no bowel obstruction. PERITONEUM AND RETROPERITONEUM: No ascites. No free air. No free pelvic fluid. VASCULATURE: Aorta  is normal in caliber. Diffuse aortoiliac atherosclerosis. LYMPH NODES: Unchanged mildly enlarged left lower paraaortic lymph node measuring 1.5 cm. Given the stability, this is likely benign. REPRODUCTIVE ORGANS: Hysterectomy. BONES AND SOFT TISSUES: Multilevel degenerative disc disease of the spine. Moderate osteoarthritis of the hips. No acute osseous abnormality. No focal soft tissue abnormality. IMPRESSION: 1. No acute findings in the abdomen or pelvis. 2. Terminal ileal and total colonic diverticulosis. No changes of acute diverticulitis. 3. Cholecystolithiasis. Electronically signed by: Rogelia Myers MD MD 11/05/2024 03:31 PM EST RP Workstation: [id number]    Scheduled Meds:  apixaban   5 mg Oral BID   pantoprazole  (PROTONIX )  IV  40 mg Intravenous Q12H   sodium chloride  flush  3 mL Intravenous Q12H   Continuous Infusions:  promethazine  (PHENERGAN ) injection (IM or IVPB) Stopped (11/05/24 1703)     LOS: 0 days   Time spent: 40 minutes  Casimer Dare, MD  [facility]  11/06/2024, 12:48 PM   "

## 2024-11-06 NOTE — Evaluation (Addendum)
 Physical Therapy Evaluation Patient Details Name: [name] MRN: [id number] DOB: 1940/11/25 Today's Date: 11/06/2024  History of Present Illness  Pt is 84 yo presenting to [facility] on 1/12 due to intractable nausea/vomiting, dizziness, diarrhea for the past few days. Heat CT showed partially empty sella which is chronic, MRI unable to be obtained due to pacemaker. CT abdomen/pelvis showed no acute findings in abdomen, US  of abdomen with findings of cholelithiasis and gallbladder sludge. PMH: of chronic diastolic CHF, CKD stage IIb, PAF on Eliquis , sick sinus syndrome status post pacemaker, HTN, CAD, HLD, CVA, chronic LE wounds, depression, obesity.  Clinical Impression  Pt is currently presenting at [facility] to Min A for bed mobility, 2 person Mod A for sit to stand and was unable to take steps with RW. Pt was Mod I for sit to stand, gait , transfers prior to hospitalization with use of stand up rollator/hospital bed. Pt is limited due to dizziness currently which does not seem to be related to BPPV due to constant vertigo that does get worse with mobility but does not improve with time. Due to pt current functional status, home set up and available assistance at home recommending skilled physical therapy services < 3 hours/day in order to address strength, balance and functional mobility to decrease risk for falls, injury, immobility, skin break down and re-hospitalization.          If plan is discharge home, recommend the following: A little help with walking and/or transfers;Assist for transportation;Help with stairs or ramp for entrance;Assistance with cooking/housework   Can travel by private vehicle   No    Equipment Recommendations None recommended by PT  Recommendations for Other Services  OT consult    Functional Status Assessment Patient has had a recent decline in their functional status and demonstrates the ability to make significant improvements in function in a reasonable and  predictable amount of time.     Precautions / Restrictions Precautions Precautions: Fall Recall of Precautions/Restrictions: Impaired Restrictions Weight Bearing Restrictions Per Provider Order: No      Mobility  Bed Mobility Overal bed mobility: Needs Assistance Bed Mobility: Supine to Sit, Sit to Supine     Supine to sit: Contact guard, HOB elevated Sit to supine: Min assist, Used rails   General bed mobility comments: CGA for safety, HOB elevated, pt used rails to scoot up in bed and requires MIn A for LE to EOB.    Transfers Overall transfer level: Needs assistance Equipment used: Rolling walker (2 wheels) Transfers: Sit to/from Stand Sit to Stand: Mod assist, +2 safety/equipment, +2 physical assistance           General transfer comment: 2 person Mod A for sit to stand from EOB with significant flexion in posture throughout standing.    Ambulation/Gait     General Gait Details: unable to take steps.     Balance Overall balance assessment: Needs assistance Sitting-balance support: Feet supported, Bilateral upper extremity supported Sitting balance-Leahy Scale: Fair     Standing balance support: Bilateral upper extremity supported, Reliant on assistive device for balance Standing balance-Leahy Scale: Poor Standing balance comment: Mod to Min A for standing with flexed posture and heavy reliance on AD       Pertinent Vitals/Pain Pain Assessment Pain Assessment: Faces Faces Pain Scale: Hurts even more Pain Location: urethra with urination Pain Descriptors / Indicators: Burning Pain Intervention(s): Monitored during session, Repositioned    Home Living Family/patient expects to be discharged to:: Private residence Living Arrangements:  Alone Available Help at Discharge: Family;Personal care attendant;Available PRN/intermittently Type of Home: House Home Access: Stairs to enter Entrance Stairs-Rails: None Entrance Stairs-Number of Steps: 2   Home  Layout: One level Home Equipment: Rollator (4 wheels);Hospital bed;Tub bench;Hand held shower head      Prior Function Prior Level of Function : Needs assist             Mobility Comments: walks with rollator (stand up rollator) ADLs Comments: Pt gets assist with bathing, dressing, meal prep and house keeping. Meaily helps with meals on weekend.     Extremity/Trunk Assessment   Upper Extremity Assessment Upper Extremity Assessment: Generalized weakness;Defer to OT evaluation    Lower Extremity Assessment Lower Extremity Assessment: Generalized weakness    Cervical / Trunk Assessment Cervical / Trunk Assessment: Other exceptions Cervical / Trunk Exceptions: pt was completely flexed at the trunk.  Communication   Communication Communication: No apparent difficulties    Cognition Arousal: Alert Behavior During Therapy: WFL for tasks assessed/performed   PT - Cognitive impairments: No apparent impairments     Following commands: Intact       Cueing Cueing Techniques: Verbal cues     General Comments General comments (skin integrity, edema, etc.): Pt purewick was not working, pt urinated on sitting up and then again on standing. Pt reported burning initially with urination        Assessment/Plan    PT Assessment Patient needs continued PT services  PT Problem List Decreased strength;Decreased activity tolerance;Decreased balance;Decreased mobility       PT Treatment Interventions DME instruction;Therapeutic exercise;Gait training;Balance training;Stair training;Functional mobility training;Therapeutic activities;Patient/family education    PT Goals (Current goals can be found in the Care Plan section)  Acute Rehab PT Goals Patient Stated Goal: to go home PT Goal Formulation: With patient Time For Goal Achievement: 11/20/24 Potential to Achieve Goals: Good    Frequency Min 2X/week        AM-PAC PT 6 Clicks Mobility  Outcome Measure Help needed  turning from your back to your side while in a flat bed without using bedrails?: A Little Help needed moving from lying on your back to sitting on the side of a flat bed without using bedrails?: A Little Help needed moving to and from a bed to a chair (including a wheelchair)?: A Lot Help needed standing up from a chair using your arms (e.g., wheelchair or bedside chair)?: A Lot Help needed to walk in hospital room?: Total Help needed climbing 3-5 steps with a railing? : Total 6 Click Score: 12    End of Session Equipment Utilized During Treatment: Gait belt Activity Tolerance: Patient limited by fatigue Patient left: in bed;with call bell/phone within reach;with bed alarm set Nurse Communication: Mobility status;Other (comment) (burning with urination) PT Visit Diagnosis: Unsteadiness on feet (R26.81);Other abnormalities of gait and mobility (R26.89);Muscle weakness (generalized) (M62.81)    Time: 8563-8495 PT Time Calculation (min) (ACUTE ONLY): 28 min   Charges:   PT Evaluation $PT Eval Low Complexity: 1 Low PT Treatments $Therapeutic Activity: 8-22 mins PT General Charges $$ ACUTE PT VISIT: 1 Visit       Dorothyann Maier, DPT, CLT  Acute Rehabilitation Services Office: [phone] (Secure chat preferred)   Dorothyann VEAR Maier 11/06/2024, 3:13 PM

## 2024-11-06 NOTE — Plan of Care (Signed)

## 2024-11-07 DIAGNOSIS — R112 Nausea with vomiting, unspecified: Secondary | ICD-10-CM | POA: Diagnosis not present

## 2024-11-07 MED ORDER — PANTOPRAZOLE SODIUM 40 MG PO TBEC
40.0000 mg | DELAYED_RELEASE_TABLET | Freq: Every day | ORAL | Status: DC
  Administered 2024-11-08 – 2024-11-09 (×2): 40 mg via ORAL
  Filled 2024-11-07 (×2): qty 1

## 2024-11-07 MED ORDER — OXYCODONE HCL 5 MG PO TABS
5.0000 mg | ORAL_TABLET | ORAL | Status: DC | PRN
  Administered 2024-11-07: 5 mg via ORAL
  Administered 2024-11-08: 10 mg via ORAL
  Administered 2024-11-08 (×2): 5 mg via ORAL
  Administered 2024-11-09: 10 mg via ORAL
  Filled 2024-11-07: qty 2
  Filled 2024-11-07 (×2): qty 1
  Filled 2024-11-07: qty 2
  Filled 2024-11-07: qty 1

## 2024-11-07 MED ORDER — PANTOPRAZOLE SODIUM 40 MG PO TBEC: 40.0000 mg | DELAYED_RELEASE_TABLET | Freq: Every day | ORAL | Status: DC

## 2024-11-07 NOTE — Plan of Care (Signed)
   Problem: Education: Goal: Knowledge of General Education information will improve Description: Including pain rating scale, medication(s)/side effects and non-pharmacologic comfort measures Outcome: Progressing   Problem: Clinical Measurements: Goal: Will remain free from infection Outcome: Progressing Goal: Diagnostic test results will improve Outcome: Progressing   Problem: Activity: Goal: Risk for activity intolerance will decrease Outcome: Progressing

## 2024-11-07 NOTE — Progress Notes (Signed)
 "  [name]  FMW:[id number] DOB: 07/05/1941 DOA: 11/05/2024 PCP: Charlette Erla LABOR, MD    Brief Narrative:  84 year old with a history of CAD, HTN, HLD, OSA intolerant to CPAP, CVA, diastolic CHF, morbid obesity, DM2, CKD stage III, chronic lymphedema with venous stasis ulcerations, atrial fibrillation on chronic Eliquis , and SSS status post pacemaker placement who presented to the ER 1/12 with intractable nausea vomiting and diarrhea accompanied by dizziness for several days.  CT head revealed a partially empty sella but no acute findings.  MRI of the brain unable to be accomplished as her pacemaker is incompatible.  CT abdomen/pelvis revealed no acute findings.  Ultrasound of the abdomen revealed cholelithiasis with sludge but no evidence of acute cholecystitis.  Goals of Care:   Code Status: Full Code   DVT prophylaxis:  apixaban  (ELIQUIS ) tablet 5 mg   Interim Hx: Afebrile.  Vital signs stable.  Renal function stable.  C. difficile negative.  Patient reports that her vomiting and diarrhea have resolved but her nausea persists.  This has caused limited oral intake.  She denies chest pain.  Assessment & Plan:  Intractable nausea vomiting and diarrhea Viral gastroenteritis versus overuse of Linzess - Linzess on hold -appears to be slowly improving spontaneously  Dizziness -orthostasis Felt to be due to simple volume depletion -amlodipine  on hold  Chronic diastolic CHF Diuretics on hold for now  CKD Renal function stable  Chronic atrial fibrillation Continue usual metoprolol  and Eliquis   Chronic bilateral lower extremity lymphedema with chronic venous stasis WOC team following -followed in outpatient wound care center and usually utilizes Unna boots which have been reapplied  Morbid obesity - Body mass index is 61.91 kg/m.     Family Communication:  Disposition:   Skilled Nursing-Short Term Rehab (<3 Hours/Day)11/06/2024 1511  Objective: Blood  pressure (!) 147/67, pulse 68, temperature 98.1 F (36.7 C), resp. rate 16, height 5' 6 (1.676 m), weight (!) 174 kg, SpO2 93%.  Intake/Output Summary (Last 24 hours) at 11/07/2024 0929 Last data filed at 11/07/2024 0239 Gross per 24 hour  Intake 284.27 ml  Output 300 ml  Net -15.73 ml   Filed Weights   11/06/24 1221 11/07/24 0500  Weight: (!) 172.4 kg (!) 174 kg    Examination: General: No acute respiratory distress Lungs: Clear to auscultation bilaterally without wheezes or crackles Cardiovascular: Regular rate and rhythm without murmur gallop or rub normal S1 and S2 Abdomen: Nontender, nondistended, soft, bowel sounds positive, no rebound, no ascites, no appreciable mass Extremities: Unna boots in place bilateral lower extremities  CBC: Recent Labs  Lab 11/05/24 1048 11/05/24 1121 11/06/24 0437  WBC 3.6*  --  4.5  NEUTROABS 2.3  --   --   HGB 15.7* 17.3* 14.3  HCT 49.2* 51.0* 45.0  MCV 99.4  --  101.4*  PLT 150  --  146*   Basic Metabolic Panel: Recent Labs  Lab 11/05/24 1048 11/05/24 1121 11/06/24 0437  NA 141 142 141  K 4.3 4.2 4.1  CL 103 102 105  CO2 30  --  29  GLUCOSE 135* 132* 98  BUN 19 21 16   CREATININE 1.40* 1.40* 1.32*  CALCIUM  9.8  --  8.9  MG  --   --  2.2   GFR: Estimated Creatinine Clearance: 55.5 mL/min (A) (by C-G formula based on SCr of 1.32 mg/dL (H)).   Scheduled Meds:  apixaban   5 mg Oral BID   metoprolol  succinate  25 mg Oral Daily   pantoprazole  (  PROTONIX ) IV  40 mg Intravenous Q12H   sacubitril -valsartan   1 tablet Oral BID   sodium chloride  flush  3 mL Intravenous Q12H   Continuous Infusions:  promethazine  (PHENERGAN ) injection (IM or IVPB) 12.5 mg (11/06/24 1726)     LOS: 0 days   Reyes IVAR Moores, MD [facility] Office  [phone] Pager - Text Page per Tracey  If 7PM-7AM, please contact night-coverage per Amion 11/07/2024, 9:29 AM     "

## 2024-11-07 NOTE — TOC Initial Note (Signed)
 Transition of Care [facility]) - Initial/Assessment Note    Patient Details  Name: [name] MRN: [id number] Date of Birth: 07-29-1941  Transition of Care [facility]) CM/SW Contact:    Luise JAYSON Pan, LCSWA Phone Number: 11/07/2024, 1:10 PM  Clinical Narrative:  This CSW assisted Unit SW. CSW met patient at bedside to discuss PT rec for SNF. Patient declined SNF at this time. CSW asked patient if she is able to ambulate, patient stated she typically ambulates with a RW. Patient informed CSW that she lives at home with her brother and he can help assist her at home. Patient stated she goes to [facility] for her PCP. Patient stated she has reliable transportation to get to and from appointments. CSW notified unit [facility] team of above information.             Expected Discharge Plan: Home w Home Health Services (per patietns wishes) Barriers to Discharge: Continued Medical Work up   Patient Goals and CMS Choice Patient states their goals for this hospitalization and ongoing recovery are:: To go home          Expected Discharge Plan and Services In-house Referral: Clinical Social Work Discharge Planning Services: CM Consult Post Acute Care Choice: Home Health Living arrangements for the past 2 months: Single Family Home                                      Prior Living Arrangements/Services Living arrangements for the past 2 months: Single Family Home Lives with:: Self, Siblings Patient language and need for interpreter reviewed:: Yes Do you feel safe going back to the place where you live?: Yes      Need for Family Participation in Patient Care: Yes (Comment) Care giver support system in place?: Yes (comment) Current home services: Homehealth aide, DME (hospital bed, wheelchair, walker, lift chair) Criminal Activity/Legal Involvement Pertinent to Current Situation/Hospitalization: No - Comment as needed  Activities of Daily Living   ADL Screening (condition  at time of admission) Independently performs ADLs?: No Does the patient have a NEW difficulty with bathing/dressing/toileting/self-feeding that is expected to last >3 days?: No Does the patient have a NEW difficulty with getting in/out of bed, walking, or climbing stairs that is expected to last >3 days?: No Does the patient have a NEW difficulty with communication that is expected to last >3 days?: No Is the patient deaf or have difficulty hearing?: No Does the patient have difficulty seeing, even when wearing glasses/contacts?: No Does the patient have difficulty concentrating, remembering, or making decisions?: No  Permission Sought/Granted   Permission granted to share information with : No              Emotional Assessment Appearance:: Appears stated age Attitude/Demeanor/Rapport: Engaged Affect (typically observed): Restless Orientation: : Oriented to  Time, Oriented to Place, Oriented to Self, Oriented to Situation Alcohol / Substance Use: Not Applicable Psych Involvement: No (comment)  Admission diagnosis:  Intractable nausea and vomiting [R11.2] Patient Active Problem List   Diagnosis Date Noted   Intractable nausea and vomiting 11/05/2024   Acute vestibular syndrome 08/14/2022   Acute kidney injury superimposed on stage 3a chronic kidney disease (HCC) 08/09/2022   Chronic venous stasis dermatitis 08/09/2022   Lipodermatosclerosis of both lower extremities 08/09/2022   Thrombocytopenia 08/09/2022   B12 deficiency 08/09/2022   Esophagitis with gastritis    Nausea and vomiting  Open wound of right lower leg    Ulcers of both lower legs, limited to breakdown of skin (HCC)    Diarrhea of presumed infectious origin    Sepsis (HCC) 06/03/2022   Colon cancer screening 08/31/2021   Constipation 08/31/2021   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   Flatulence, eructation and gas pain 08/31/2021   Gastroesophageal reflux disease 08/31/2021   Chest pressure 08/18/2021    Palpitations 08/18/2021   Prolonged QT interval 08/18/2021   PNA (pneumonia) 07/16/2021   Community acquired pneumonia 07/16/2021   Hammer toes of both feet 12/31/2020   Onychomycosis due to dermatophyte 12/31/2020   Miosis 10/13/2020   Nuclear sclerotic cataract 10/13/2020   Pressure injury of skin 09/20/2020   Acute on chronic heart failure with preserved ejection fraction (HCC) 09/20/2020   Bacteremia due to Escherichia coli 09/19/2020   Gram-negative sepsis, unspecified (HCC) 09/19/2020   Cellulitis of right leg 07/25/2020   NSTEMI (non-ST elevated myocardial infarction) (HCC) 07/16/2020   Chronic diastolic CHF (congestive heart failure) (HCC) 07/16/2020   Paroxysmal atrial fibrillation (HCC) 07/16/2020   Hypotension 07/16/2020   Body mass index (BMI) 45.0-49.9, adult (HCC) 06/24/2020   Hypokalemia 06/01/2020   Shortness of breath at rest 05/22/2020    Class: Acute   Palpitation 05/22/2020    Class: Acute   Shortness of breath 05/22/2020   Chronic kidney disease-mineral and bone disorder 05/20/2020   Edema 05/20/2020   Gout 05/20/2020   Hypersomnia 05/20/2020   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   Sleep apnea 05/20/2020   Acute left systolic heart failure (HCC) 06/18/2019   Acute systolic heart failure (HCC) 06/18/2019   SSS (sick sinus syndrome) (HCC) 08/01/2015   Third degree AV block (HCC) 03/27/2014   Symptomatic sinus bradycardia 03/27/2014   Pacemaker at end of battery life 03/27/2014   Cerebral infarction [facility]) 10/17/2012   Headache 10/17/2012   Headache 10/17/2012   Coronary artery disease    Hypertension    Hyperlipemia    Arthritis    CHF (congestive heart failure) (HCC)    Vertigo    TIA (transient ischemic attack)    Pacemaker    MI (myocardial infarction) (HCC)    Morbid obesity (HCC)    PCP:  Charlette Erla LABOR, MD Pharmacy:   [facility] - [place], [place] - [address]  [place] [address] Phone: [phone] Fax: [phone]  [id number] - [place], [place] - [address] [facility] [address] [place] [address] Phone: [phone] Fax: [phone]     Social Drivers of Health (SDOH) Social History: SDOH Screenings   Food Insecurity: No Food Insecurity (11/06/2024)  Housing: Low Risk (11/06/2024)  Transportation Needs: No Transportation Needs (11/06/2024)  Utilities: Not At Risk (11/06/2024)  Social Connections: Socially Isolated (11/06/2024)  Tobacco Use: Low Risk (11/06/2024)   SDOH Interventions:     Readmission Risk Interventions     No data to display

## 2024-11-08 DIAGNOSIS — R112 Nausea with vomiting, unspecified: Secondary | ICD-10-CM | POA: Diagnosis not present

## 2024-11-08 MED ORDER — METOCLOPRAMIDE HCL 5 MG PO TABS
5.0000 mg | ORAL_TABLET | Freq: Three times a day (TID) | ORAL | Status: DC
  Administered 2024-11-08 – 2024-11-09 (×4): 5 mg via ORAL
  Filled 2024-11-08 (×4): qty 1

## 2024-11-08 NOTE — Progress Notes (Signed)
 "  AMARI BURNSWORTH  FMW:[id number] DOB: [birth year] DOA: 11/05/2024 PCP: Charlette Erla LABOR, MD    Brief Narrative:  84 year old with a history of CAD, HTN, HLD, OSA intolerant to CPAP, CVA, diastolic CHF, morbid obesity, DM2, CKD stage III, chronic lymphedema with venous stasis ulcerations, atrial fibrillation on chronic Eliquis , and SSS status post pacemaker placement who presented to the ER 1/12 with intractable nausea vomiting and diarrhea accompanied by dizziness for several days.  CT head revealed a partially empty sella but no acute findings.  MRI of the brain unable to be accomplished as her pacemaker is incompatible.  CT abdomen/pelvis revealed no acute findings.  Ultrasound of the abdomen revealed cholelithiasis with sludge but no evidence of acute cholecystitis.  Goals of Care:   Code Status: Full Code   DVT prophylaxis:  apixaban  (ELIQUIS ) tablet 5 mg   Interim Hx: No acute events recorded overnight.  Afebrile.  Vital signs stable.  Sitting up in a bedside chair.  Tells me she feels much better today.  Thinks she is ready to try a regular diet.  Denies chest pain or shortness of breath.  Some mild nausea but no vomiting and no more diarrhea.  Assessment & Plan:  Intractable nausea vomiting and diarrhea Viral gastroenteritis versus overuse of Linzess - Linzess on hold -appears to be slowly improving -advance to regular diet today  Dizziness - orthostasis Felt to be due to simple volume depletion - amlodipine  on hold - blood pressure stable/mildly elevated  Chronic diastolic CHF Diuretics on hold for now  CKD IIIb Renal function stable  Chronic atrial fibrillation Continue usual metoprolol  and Eliquis  -rate controlled  Chronic bilateral lower extremity lymphedema with chronic venous stasis WOC team following -followed in outpatient wound care center and usually utilizes Unna boots which have been reapplied during this hospital stay  Morbid obesity - Body mass  index is 61.91 kg/m.     Family Communication: Spoke with daughter at bedside Disposition:   Skilled Nursing-Short Term Rehab (<3 Hours/Day)11/06/2024 1511 has been recommended but patient declines and plans to discharge home  Objective: Blood pressure (!) 157/83, pulse 77, temperature 98.6 F (37 C), temperature source Oral, resp. rate 18, height 5' 6 (1.676 m), weight (!) 174 kg, SpO2 94%.  Intake/Output Summary (Last 24 hours) at 11/08/2024 0900 Last data filed at 11/07/2024 1700 Gross per 24 hour  Intake 240 ml  Output 1100 ml  Net -860 ml   Filed Weights   11/06/24 1221 11/07/24 0500  Weight: (!) 172.4 kg (!) 174 kg    Examination: General: No acute respiratory distress Lungs: Clear to auscultation bilaterally without wheezes or crackles Cardiovascular: Regular rate and rhythm without murmur  Abdomen: Obese, soft, bowel sounds positive, no rebound Extremities: Unna boots in place bilateral lower extremities  CBC: Recent Labs  Lab 11/05/24 1048 11/05/24 1121 11/06/24 0437  WBC 3.6*  --  4.5  NEUTROABS 2.3  --   --   HGB 15.7* 17.3* 14.3  HCT 49.2* 51.0* 45.0  MCV 99.4  --  101.4*  PLT 150  --  146*   Basic Metabolic Panel: Recent Labs  Lab 11/05/24 1048 11/05/24 1121 11/06/24 0437  NA 141 142 141  K 4.3 4.2 4.1  CL 103 102 105  CO2 30  --  29  GLUCOSE 135* 132* 98  BUN 19 21 16   CREATININE 1.40* 1.40* 1.32*  CALCIUM  9.8  --  8.9  MG  --   --  2.2  GFR: Estimated Creatinine Clearance: 55.5 mL/min (A) (by C-G formula based on SCr of 1.32 mg/dL (H)).   Scheduled Meds:  apixaban   5 mg Oral BID   metoprolol  succinate  25 mg Oral Daily   pantoprazole   40 mg Oral Daily   sacubitril -valsartan   1 tablet Oral BID   sodium chloride  flush  3 mL Intravenous Q12H   Continuous Infusions:  promethazine  (PHENERGAN ) injection (IM or IVPB) 12.5 mg (11/07/24 1427)     LOS: 0 days   Reyes IVAR Moores, MD [facility] Office  [phone] Pager -  Text Page per Tracey  If 7PM-7AM, please contact night-coverage per Amion 11/08/2024, 9:00 AM     "

## 2024-11-08 NOTE — Progress Notes (Signed)
 Physical Therapy Treatment Patient Details Name: [name] MRN: [id number] DOB: 11-08-1940 Today's Date: 11/08/2024   History of Present Illness Pt is 84 yo presenting to [facility] on 1/12 due to intractable nausea/vomiting, dizziness, diarrhea for the past few days. Heat CT showed partially empty sella which is chronic, MRI unable to be obtained due to pacemaker. CT abdomen/pelvis showed no acute findings in abdomen, US  of abdomen with findings of cholelithiasis and gallbladder sludge. PMH: of chronic diastolic CHF, CKD stage IIb, PAF on Eliquis , sick sinus syndrome status post pacemaker, HTN, CAD, HLD, CVA, chronic LE wounds, depression, obesity.    PT Comments  Pt seated in recliner, reports some pain to BLE around wound sites but agreeable to session. Complete seated exercises for BUE and BLE before initiating standing. Completes x3 sit<>stands from recliner initially requiring MIN A and progressing to CGA. Moves slow with transfer but demonstrates good safety awareness. Able to march in place during last stand. She is making good functional progress and will continue to benefit from skilled PT intervention. Pt's goal is to return home with intermittent/PRN support from family and avoid low intensity rehab stay.    If plan is discharge home, recommend the following: A little help with walking and/or transfers;Assist for transportation;Help with stairs or ramp for entrance;Assistance with cooking/housework   Can travel by private vehicle     No  Equipment Recommendations  None recommended by PT    Recommendations for Other Services       Precautions / Restrictions Precautions Precautions: Fall Recall of Precautions/Restrictions: Intact Restrictions Weight Bearing Restrictions Per Provider Order: No     Mobility  Bed Mobility               General bed mobility comments: pt seated in recliner upon PT arrival, left up in recliner at end of session    Transfers Overall  transfer level: Needs assistance Equipment used: Rolling walker (2 wheels) Transfers: Sit to/from Stand Sit to Stand: Min assist, Contact guard assist           General transfer comment: x3 during session, improved to CGA for last transfer; transfers best with BUE support pushing up from recliner arm rests, adjusting feet while in midstance and then standing tall; during last stand, able to march in place x8 each LE. VC for posture and PLB    Ambulation/Gait               General Gait Details: initiated pre-gait training with stepping in place, x8 each LE; pt tolerated activity well, will benefit from continued progress to gait   Stairs             Wheelchair Mobility     Tilt Bed    Modified Rankin (Stroke Patients Only)       Balance Overall balance assessment: Needs assistance Sitting-balance support: Feet supported, Bilateral upper extremity supported Sitting balance-Leahy Scale: Fair     Standing balance support: Bilateral upper extremity supported, Reliant on assistive device for balance Standing balance-Leahy Scale: Poor Standing balance comment: reliant on RW                            Communication Communication Communication: No apparent difficulties  Cognition Arousal: Alert Behavior During Therapy: WFL for tasks assessed/performed   PT - Cognitive impairments: No apparent impairments  Following commands: Intact      Cueing Cueing Techniques: Verbal cues  Exercises Other Exercises Other Exercises: seated Ex x8-10 each: LAQ BLE, marches BLE, heel/toe rocks, seated push-ups    General Comments        Pertinent Vitals/Pain Pain Assessment Pain Assessment: Faces Faces Pain Scale: Hurts even more Pain Location: knees Pain Descriptors / Indicators: Aching, Sore Pain Intervention(s): Limited activity within patient's tolerance, Monitored during session, Premedicated before session,  Repositioned    Home Living                          Prior Function            PT Goals (current goals can now be found in the care plan section) Acute Rehab PT Goals Patient Stated Goal: to go home PT Goal Formulation: With patient Time For Goal Achievement: 11/20/24 Potential to Achieve Goals: Good Progress towards PT goals: Progressing toward goals    Frequency    Min 2X/week      PT Plan      Co-evaluation              AM-PAC PT 6 Clicks Mobility   Outcome Measure  Help needed turning from your back to your side while in a flat bed without using bedrails?: A Little Help needed moving from lying on your back to sitting on the side of a flat bed without using bedrails?: A Little Help needed moving to and from a bed to a chair (including a wheelchair)?: A Lot Help needed standing up from a chair using your arms (e.g., wheelchair or bedside chair)?: A Little Help needed to walk in hospital room?: Total Help needed climbing 3-5 steps with a railing? : Total 6 Click Score: 13    End of Session Equipment Utilized During Treatment: Gait belt Activity Tolerance: Patient limited by pain Patient left: in chair;with call bell/phone within reach Nurse Communication: Mobility status;Other (comment) (positioning and body mechanics to help with transfer) PT Visit Diagnosis: Unsteadiness on feet (R26.81);Other abnormalities of gait and mobility (R26.89);Muscle weakness (generalized) (M62.81)     Time: 8545-8482 PT Time Calculation (min) (ACUTE ONLY): 23 min  Charges:    $Therapeutic Exercise: 8-22 mins $Therapeutic Activity: 8-22 mins                       Isaiah DEL. Anastacia Reinecke, PT, DPT   [employer] 11/08/2024, 4:34 PM

## 2024-11-08 NOTE — Plan of Care (Signed)
   Problem: Activity: Goal: Risk for activity intolerance will decrease Outcome: Progressing   Problem: Nutrition: Goal: Adequate nutrition will be maintained Outcome: Progressing   Problem: Pain Managment: Goal: General experience of comfort will improve and/or be controlled Outcome: Progressing   Problem: Safety: Goal: Ability to remain free from injury will improve Outcome: Progressing

## 2024-11-09 ENCOUNTER — Other Ambulatory Visit (HOSPITAL_COMMUNITY): Payer: Self-pay

## 2024-11-09 DIAGNOSIS — R112 Nausea with vomiting, unspecified: Secondary | ICD-10-CM | POA: Diagnosis not present

## 2024-11-09 LAB — BASIC METABOLIC PANEL WITH GFR
Anion gap: 8 (ref 5–15)
BUN: 15 mg/dL (ref 8–23)
CO2: 27 mmol/L (ref 22–32)
Calcium: 8.6 mg/dL — ABNORMAL LOW (ref 8.9–10.3)
Chloride: 105 mmol/L (ref 98–111)
Creatinine, Ser: 1.47 mg/dL — ABNORMAL HIGH (ref 0.44–1.00)
GFR, Estimated: 35 mL/min — ABNORMAL LOW
Glucose, Bld: 97 mg/dL (ref 70–99)
Potassium: 3.9 mmol/L (ref 3.5–5.1)
Sodium: 140 mmol/L (ref 135–145)

## 2024-11-09 MED ORDER — METOPROLOL SUCCINATE ER 50 MG PO TB24: 50.0000 mg | ORAL_TABLET | Freq: Every day | ORAL | Status: DC

## 2024-11-09 MED ORDER — METOPROLOL SUCCINATE ER 100 MG PO TB24
50.0000 mg | ORAL_TABLET | Freq: Every day | ORAL | 0 refills | Status: AC
  Filled 2024-11-09: qty 45, 90d supply, fill #0

## 2024-11-09 NOTE — Progress Notes (Signed)
 Physical Therapy Treatment Patient Details Name: [name] MRN: [id number] DOB: 1941/02/01 Today's Date: 11/09/2024   History of Present Illness Pt is 84 yo presenting to [facility] on 1/12 due to intractable nausea/vomiting, dizziness, diarrhea for the past few days. Heat CT showed partially empty sella which is chronic, MRI unable to be obtained due to pacemaker. CT abdomen/pelvis showed no acute findings in abdomen, US  of abdomen with findings of cholelithiasis and gallbladder sludge. PMH: of chronic diastolic CHF, CKD stage IIb, PAF on Eliquis , sick sinus syndrome status post pacemaker, HTN, CAD, HLD, CVA, chronic LE wounds, depression, obesity.    PT Comments  Pt resting in bed and agreeable to PT session. Vitals assessed supine due to reported dizziness and elevated BP earlier: 168/85, HR 68, spO2 94% on RA. Transitions to sit at EOB, no increase in dizziness but does require time to sit before standing and pivoting to [facility]. All transfers require use of RW and MIN A, intermittent cues for posture and proximity to device. Pt continues to demonstrates good functional improvements. PT will continue to work with pt on strength, mobility and endurance as she still wants to discharge home with HHPT vs discharge to short term rehab.    If plan is discharge home, recommend the following: A little help with walking and/or transfers;Assist for transportation;Help with stairs or ramp for entrance;Assistance with cooking/housework   Can travel by private vehicle     No  Equipment Recommendations  None recommended by PT    Recommendations for Other Services       Precautions / Restrictions Precautions Precautions: Fall Recall of Precautions/Restrictions: Intact Restrictions Weight Bearing Restrictions Per Provider Order: No     Mobility  Bed Mobility Overal bed mobility: Needs Assistance       Supine to sit: Min assist, HOB elevated, Used rails     General bed mobility comments:  MIN A to scoot R hip to EOB, pt demos good use of bed features as she would use at home her personal bed    Transfers Overall transfer level: Needs assistance Equipment used: Rolling walker (2 wheels) Transfers: Sit to/from Stand, Bed to chair/wheelchair/BSC Sit to Stand: Min assist   Step pivot transfers: Min assist       General transfer comment: x3 sit<>stands from bed and BSC surfaces at MIN A with RW with assistance to stabilize RW while pt transitioned hands to device, good standing balance noted while pt assisting with pericare. Pt transfers EOB>BSC>recliner with RW at MIN A and VC for improved posture while taking steps    Ambulation/Gait Ambulation/Gait assistance: Min assist Gait Distance (Feet): 10 Feet (EOB>BSC>recliner) Assistive device: Rolling walker (2 wheels) Gait Pattern/deviations: Step-through pattern, Wide base of support Gait velocity: decreased     General Gait Details: heavy reliance of RW, forward posture which she can correct intermittently with VC; good tolerance to taking steps between surfaces and will benefit from continued gait progress; limited in session by intermittent dizziness despite vitals being stable   Stairs             Wheelchair Mobility     Tilt Bed    Modified Rankin (Stroke Patients Only)       Balance Overall balance assessment: Needs assistance Sitting-balance support: Feet supported, Bilateral upper extremity supported Sitting balance-Leahy Scale: Fair     Standing balance support: Bilateral upper extremity supported, Reliant on assistive device for balance Standing balance-Leahy Scale: Poor Standing balance comment: reliant on RW  Communication Communication Communication: No apparent difficulties  Cognition Arousal: Alert Behavior During Therapy: WFL for tasks assessed/performed   PT - Cognitive impairments: No apparent impairments                          Following commands: Intact      Cueing Cueing Techniques: Verbal cues  Exercises      General Comments        Pertinent Vitals/Pain Pain Assessment Pain Assessment: No/denies pain    Home Living                          Prior Function            PT Goals (current goals can now be found in the care plan section) Acute Rehab PT Goals Patient Stated Goal: to go home PT Goal Formulation: With patient Time For Goal Achievement: 11/20/24 Potential to Achieve Goals: Good Progress towards PT goals: Progressing toward goals    Frequency    Min 2X/week      PT Plan      Co-evaluation              AM-PAC PT 6 Clicks Mobility   Outcome Measure  Help needed turning from your back to your side while in a flat bed without using bedrails?: A Little Help needed moving from lying on your back to sitting on the side of a flat bed without using bedrails?: A Little Help needed moving to and from a bed to a chair (including a wheelchair)?: A Little Help needed standing up from a chair using your arms (e.g., wheelchair or bedside chair)?: A Little Help needed to walk in hospital room?: A Lot Help needed climbing 3-5 steps with a railing? : Total 6 Click Score: 15    End of Session Equipment Utilized During Treatment: Gait belt Activity Tolerance: Patient tolerated treatment well Patient left: in chair;with call bell/phone within reach Nurse Communication: Mobility status;Other (comment) (new purewick placed due to previous one being soiled) PT Visit Diagnosis: Unsteadiness on feet (R26.81);Other abnormalities of gait and mobility (R26.89);Muscle weakness (generalized) (M62.81)     Time: 9050-8977 PT Time Calculation (min) (ACUTE ONLY): 33 min  Charges:    $Therapeutic Activity: 23-37 mins                       Isaiah H. Naryiah Schley, PT, DPT   [employer] 11/09/2024, 10:51 AM

## 2024-11-09 NOTE — Progress Notes (Signed)
 Discharge Nurse Summary: DC order noted per MD. DC RN at bedside with patient. Patient agreeable with discharge plan, agreeable to transport to dc lounge to await TOC meds and family pickup. AVS printed/reviewed. PIV removed. Telemonitor not present on assessment. No DME needs. TOC meds pending pickup. CP/Edu resolved. Patient dressed and ready to go. All belongings accounted for. Transport team wheeled patient downstairs to [employer].  Rosario Lund, RN

## 2024-11-09 NOTE — Plan of Care (Signed)
  Problem: Education: Goal: Knowledge of General Education information will improve Description: Including pain rating scale, medication(s)/side effects and non-pharmacologic comfort measures Outcome: Progressing   Problem: Clinical Measurements: Goal: Will remain free from infection Outcome: Progressing Goal: Cardiovascular complication will be avoided Outcome: Progressing   Problem: Activity: Goal: Risk for activity intolerance will decrease Outcome: Progressing   Problem: Nutrition: Goal: Adequate nutrition will be maintained Outcome: Progressing   

## 2024-11-09 NOTE — Discharge Summary (Signed)
 "  DISCHARGE SUMMARY  [name]  MR#: [id number]  DOB:[birth year]  Date of Admission: 11/05/2024 Date of Discharge: 11/09/2024  Attending Physician:Avelyn Touch ONEIDA Moores, MD  Patient's ERE:Djwryzs-Amlhjo, Erla LABOR, MD  Disposition: D/C home   Follow-up Appts:  Follow-up Information     Sanchez-Brugal, Erla LABOR, MD Follow up in 1 week(s).   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  Initial presentation: 84 year old with a history of CAD, HTN, HLD, OSA intolerant to CPAP, CVA, diastolic CHF, morbid obesity, DM2, CKD stage III, chronic lymphedema with venous stasis ulcerations, atrial fibrillation on chronic Eliquis , and SSS status post pacemaker placement who presented to the ER 1/12 with intractable nausea vomiting and diarrhea accompanied by dizziness for several days. CT head revealed a partially empty sella but no acute findings. MRI of the brain unable to be accomplished as her pacemaker is incompatible. CT abdomen/pelvis revealed no acute findings. Ultrasound of the abdomen revealed cholelithiasis with sludge but no evidence of acute cholecystitis.   Hospital Course:  Intractable nausea vomiting and diarrhea Viral gastroenteritis versus overuse of Linzess - Linzess on hold with resolution of symptoms -advised to avoid further use of Linzess -tolerating regular diet at time of discharge   Dizziness - orthostasis Felt to be due to simple volume depletion in setting of above - amlodipine  discontinued during this hospital stay due to propensity to cause lower extremity edema - sx resolved prior to d/c home     Chronic diastolic CHF Diuretics resumed prior to discharge   CKD IIIb Renal function stable during this admission   Chronic atrial fibrillation Continue usual metoprolol  and Eliquis  - rate controlled - increased metoprolol  dose during this admission as blood pressure was persistently elevated   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  Allergies as of 11/09/2024       Reactions   Omnipen [ampicillin] Other (See Comments)   Increases heart rhythm   Penicillins Other (See Comments)   Increases heart rhythm   Linzess [linaclotide] Other (See Comments)   Severe stomach pains        Medication List     STOP taking these medications    aMILoride 5 MG tablet Commonly known as: MIDAMOR   amLODipine  5 MG tablet Commonly known as: NORVASC    cloNIDine 0.1 MG tablet Commonly known as: CATAPRES   clotrimazole 1 % cream Commonly known as: LOTRIMIN   diclofenac  Sodium 1 % Gel Commonly known as: VOLTAREN    Linzess 145 MCG Caps capsule Generic drug: linaclotide   losartan  100 MG tablet Commonly known as: COZAAR    meclizine  25 MG tablet Commonly known as: ANTIVERT    nystatin ointment Commonly known as: MYCOSTATIN   nystatin powder Generic drug: nystatin   silver sulfADIAZINE 1 % cream Commonly known as: SILVADENE       TAKE these medications    acetaminophen  325 MG tablet Commonly known as: TYLENOL  Take 2 tablets (650 mg total) by mouth every  6 (six) hours.   albuterol  108 (90 Base) MCG/ACT inhaler Commonly known as: VENTOLIN  HFA Inhale 2 puffs into the lungs every 6 (six) hours as needed for wheezing or shortness of breath.   allopurinol  100 MG tablet Commonly known as: ZYLOPRIM  Take 200 mg by mouth daily.   apixaban  5 MG Tabs  tablet Commonly known as: ELIQUIS  Take 1 tablet (5 mg total) by mouth 2 (two) times daily.   calcitRIOL 0.5 MCG capsule Commonly known as: ROCALTROL Take 0.5 mcg by mouth daily.   doxercalciferol  0.5 MCG capsule Commonly known as: HECTOROL  Take 0.5 mcg by mouth daily.   Entresto  49-51 MG Generic drug: sacubitril -valsartan  Take 1 tablet by mouth 2 (two) times daily.   esomeprazole 20 MG capsule Commonly known as: NEXIUM Take 20 mg by mouth daily at 12 noon.   Farxiga 10 MG Tabs tablet Generic drug: dapagliflozin propanediol Take 10 mg by mouth daily.   fluticasone  50 MCG/ACT nasal spray Commonly known as: FLONASE  Place 2 sprays into both nostrils at bedtime.   hydrALAZINE  50 MG tablet Commonly known as: APRESOLINE  Take 50 mg by mouth 2 (two) times daily.   metoprolol  succinate 50 MG 24 hr tablet Commonly known as: TOPROL -XL Take 1 tablet (50 mg total) by mouth daily. Take with or immediately following a meal. Start taking on: November 10, 2024 What changed:  medication strength how much to take additional instructions   nitroGLYCERIN  0.4 MG SL tablet Commonly known as: NITROSTAT  Place 0.4 mg under the tongue every 5 (five) minutes as needed.   oxyCODONE -acetaminophen  5-325 MG tablet Commonly known as: PERCOCET/ROXICET Take 1 tablet by mouth 3 (three) times daily as needed.   rosuvastatin  10 MG tablet Commonly known as: CRESTOR  Take 10 mg by mouth at bedtime.   spironolactone 25 MG tablet Commonly known as: ALDACTONE Take 25 mg by mouth daily.   torsemide  20 MG tablet Commonly known as: DEMADEX  Take 20 mg by mouth 2 (two) times daily.   Vitamin D -3 125 MCG (5000 UT) Tabs Take 5,000 Units by mouth daily.        Day of Discharge BP (!) 168/100 (BP Location: Left Arm)   Pulse 74   Temp 98.6 F (37 C)   Resp 18   Ht 5' 6 (1.676 m)   Wt (!) 175.2 kg   SpO2 90%   BMI 62.34 kg/m   Physical Exam: General: No acute respiratory distress Lungs:  Clear to auscultation bilaterally without wheezes or crackles -breath sounds distant Cardiovascular: Regular rate without murmur or rub Abdomen: Morbidly obese, soft, bowel sounds positive Extremities: Chronic 2+ bilateral lower extremity edema  Basic Metabolic Panel: Recent Labs  Lab 11/05/24 1048 11/05/24 1121 11/06/24 0437 11/09/24 0506  NA 141 142 141 140  K 4.3 4.2 4.1 3.9  CL 103 102 105 105  CO2 30  --  29 27  GLUCOSE 135* 132* 98 97  BUN 19 21 16 15   CREATININE 1.40* 1.40* 1.32* 1.47*  CALCIUM  9.8  --  8.9 8.6*  MG  --   --  2.2  --     CBC: Recent Labs  Lab 11/05/24 1048 11/05/24 1121 11/06/24 0437  WBC 3.6*  --  4.5  NEUTROABS 2.3  --   --   HGB 15.7* 17.3* 14.3  HCT 49.2* 51.0* 45.0  MCV 99.4  --  101.4*  PLT 150  --  146*    Time spent in discharge (includes decision making & examination of pt): 35  minutes  11/09/2024, 11:43 AM   Reyes IVAR Moores, MD [facility] Office  [phone]      "

## 2024-11-09 NOTE — TOC Transition Note (Signed)
 Transition of Care [facility]) - Discharge Note   Patient Details  Name: [name] MRN: [id number] Date of Birth: 08/01/1941  Transition of [facility]) CM/SW Contact:  Rosalva Jon Bloch, RN Phone Number: 11/09/2024, 2:32 PM   Clinical Narrative:    Patient will DC to: home Anticipated DC date: 11/09/2024 Family notified: yes Transport by: car      - intractable nausea/vomiting, dizziness, diarrhea  Per MD patient ready for DC today. RN, patient, and patient's brother notified of DC. Pt agreeable to home health services. Pt without provider preference. Referral made with [facility] and accepted. Pt without DME noted. States brother to assist with needed care @ home. States receives PCS hours 4 days a week for 4hrs each day. Brother to provide transportation to home. Post hospital f/u noted on AVS. Pt without RX med concerns.  RNCM will sign off for now as intervention is no longer needed. Please consult us  again if new needs arise.    Final next level of care: Home w Home Health Services Barriers to Discharge: No Barriers Identified   Patient Goals and CMS Choice Patient states their goals for this hospitalization and ongoing recovery are:: To go home   Choice offered to / list presented to : Patient      Discharge Placement                       Discharge Plan and Services Additional resources added to the After Visit Summary for   In-house Referral: Clinical Social Work Discharge Planning Services: CM Consult Post Acute Care Choice: Home Health                    HH Arranged: PT, OT [facility] Agency: [facility] Date HH Agency Contacted: 11/09/24 Time HH Agency Contacted: [phone] Representative spoke with at [facility] Agency: [facility]  Social Drivers of Health (SDOH) Interventions SDOH Screenings   Food Insecurity: No Food Insecurity (11/06/2024)  Housing: Low Risk (11/06/2024)  Transportation Needs: No Transportation Needs  (11/06/2024)  Utilities: Not At Risk (11/06/2024)  Social Connections: Socially Isolated (11/06/2024)  Tobacco Use: Low Risk (11/06/2024)     Readmission Risk Interventions     No data to display

## 2024-11-19 ENCOUNTER — Ambulatory Visit: Payer: 59

## 2024-11-19 DIAGNOSIS — I495 Sick sinus syndrome: Secondary | ICD-10-CM | POA: Diagnosis not present

## 2024-11-20 LAB — CUP PACEART REMOTE DEVICE CHECK
Battery Remaining Longevity: 111 mo
Battery Remaining Percentage: 92 %
Battery Voltage: 3.01 V
Brady Statistic AP VP Percent: 15 %
Brady Statistic AP VS Percent: 58 %
Brady Statistic AS VP Percent: 1 %
Brady Statistic AS VS Percent: 26 %
Brady Statistic RA Percent Paced: 73 %
Brady Statistic RV Percent Paced: 16 %
Date Time Interrogation Session: 20260126032547
Implantable Lead Connection Status: 753985
Implantable Lead Connection Status: 753985
Implantable Lead Implant Date: 20050217
Implantable Lead Implant Date: 20050217
Implantable Lead Location: 753859
Implantable Lead Location: 753860
Implantable Lead Serial Number: 2877
Implantable Pulse Generator Implant Date: 20250123
Lead Channel Impedance Value: 450 Ohm
Lead Channel Impedance Value: 540 Ohm
Lead Channel Pacing Threshold Amplitude: 0.875 V
Lead Channel Pacing Threshold Amplitude: 1.125 V
Lead Channel Pacing Threshold Pulse Width: 0.5 ms
Lead Channel Pacing Threshold Pulse Width: 0.5 ms
Lead Channel Sensing Intrinsic Amplitude: 1.8 mV
Lead Channel Sensing Intrinsic Amplitude: 6.5 mV
Lead Channel Setting Pacing Amplitude: 1.375
Lead Channel Setting Pacing Amplitude: 1.875
Lead Channel Setting Pacing Pulse Width: 0.5 ms
Lead Channel Setting Sensing Sensitivity: 2 mV
Pulse Gen Model: 2272
Pulse Gen Serial Number: 8232753

## 2024-11-21 ENCOUNTER — Ambulatory Visit: Payer: Self-pay | Admitting: Cardiovascular Disease

## 2024-11-26 NOTE — Progress Notes (Signed)
 Remote PPM Transmission
